# Patient Record
Sex: Female | Born: 1960 | Race: White | Hispanic: No | Marital: Single | Smoking: Current every day smoker
Health system: Southern US, Community
[De-identification: ages and names within clinical notes are randomized; demographics above are authoritative.]

## PROBLEM LIST (undated history)

## (undated) DIAGNOSIS — I10 Essential (primary) hypertension: Secondary | ICD-10-CM

## (undated) HISTORY — DX: Essential (primary) hypertension: I10

## (undated) HISTORY — PX: COLONOSCOPY: SHX174

---

## 2000-05-03 ENCOUNTER — Other Ambulatory Visit: Admission: RE | Admit: 2000-05-03 | Discharge: 2000-05-03 | Payer: Self-pay | Admitting: Gynecology

## 2001-02-09 ENCOUNTER — Emergency Department (HOSPITAL_COMMUNITY): Admission: EM | Admit: 2001-02-09 | Discharge: 2001-02-09 | Payer: Self-pay | Admitting: Emergency Medicine

## 2002-01-07 ENCOUNTER — Other Ambulatory Visit: Admission: RE | Admit: 2002-01-07 | Discharge: 2002-01-07 | Payer: Self-pay | Admitting: Gynecology

## 2003-06-17 ENCOUNTER — Encounter: Admission: RE | Admit: 2003-06-17 | Discharge: 2003-06-17 | Payer: Self-pay | Admitting: Gynecology

## 2004-08-01 ENCOUNTER — Emergency Department (HOSPITAL_COMMUNITY): Admission: EM | Admit: 2004-08-01 | Discharge: 2004-08-01 | Payer: Self-pay | Admitting: Emergency Medicine

## 2008-04-06 ENCOUNTER — Ambulatory Visit: Payer: Self-pay | Admitting: Gynecology

## 2008-04-06 ENCOUNTER — Encounter: Payer: Self-pay | Admitting: Gynecology

## 2008-04-06 ENCOUNTER — Other Ambulatory Visit: Admission: RE | Admit: 2008-04-06 | Discharge: 2008-04-06 | Payer: Self-pay | Admitting: Gynecology

## 2009-02-26 ENCOUNTER — Other Ambulatory Visit: Admission: RE | Admit: 2009-02-26 | Discharge: 2009-02-26 | Payer: Self-pay | Admitting: Gynecology

## 2009-02-26 ENCOUNTER — Encounter: Payer: Self-pay | Admitting: Gynecology

## 2009-02-26 ENCOUNTER — Ambulatory Visit: Payer: Self-pay | Admitting: Gynecology

## 2017-12-04 ENCOUNTER — Emergency Department (HOSPITAL_COMMUNITY): Payer: Self-pay

## 2017-12-04 ENCOUNTER — Encounter (HOSPITAL_COMMUNITY): Payer: Self-pay | Admitting: *Deleted

## 2017-12-04 ENCOUNTER — Inpatient Hospital Stay (HOSPITAL_COMMUNITY)
Admission: EM | Admit: 2017-12-04 | Discharge: 2017-12-12 | DRG: 896 | Disposition: A | Discharge status: Home / Self Care | Payer: Self-pay | Attending: Internal Medicine | Admitting: Internal Medicine

## 2017-12-04 ENCOUNTER — Other Ambulatory Visit: Payer: Self-pay

## 2017-12-04 DIAGNOSIS — E876 Hypokalemia: Secondary | ICD-10-CM | POA: Diagnosis present

## 2017-12-04 DIAGNOSIS — F1994 Other psychoactive substance use, unspecified with psychoactive substance-induced mood disorder: Secondary | ICD-10-CM | POA: Diagnosis present

## 2017-12-04 DIAGNOSIS — R45851 Suicidal ideations: Secondary | ICD-10-CM | POA: Diagnosis present

## 2017-12-04 DIAGNOSIS — R740 Nonspecific elevation of levels of transaminase and lactic acid dehydrogenase [LDH]: Secondary | ICD-10-CM | POA: Diagnosis present

## 2017-12-04 DIAGNOSIS — F17210 Nicotine dependence, cigarettes, uncomplicated: Secondary | ICD-10-CM | POA: Diagnosis present

## 2017-12-04 DIAGNOSIS — F10239 Alcohol dependence with withdrawal, unspecified: Secondary | ICD-10-CM

## 2017-12-04 DIAGNOSIS — F10932 Alcohol use, unspecified with withdrawal with perceptual disturbance: Secondary | ICD-10-CM

## 2017-12-04 DIAGNOSIS — E871 Hypo-osmolality and hyponatremia: Secondary | ICD-10-CM | POA: Diagnosis present

## 2017-12-04 DIAGNOSIS — F10232 Alcohol dependence with withdrawal with perceptual disturbance: Secondary | ICD-10-CM

## 2017-12-04 DIAGNOSIS — F329 Major depressive disorder, single episode, unspecified: Secondary | ICD-10-CM | POA: Diagnosis present

## 2017-12-04 DIAGNOSIS — G9341 Metabolic encephalopathy: Secondary | ICD-10-CM | POA: Diagnosis present

## 2017-12-04 DIAGNOSIS — F10931 Alcohol use, unspecified with withdrawal delirium: Secondary | ICD-10-CM

## 2017-12-04 DIAGNOSIS — R296 Repeated falls: Secondary | ICD-10-CM | POA: Diagnosis present

## 2017-12-04 DIAGNOSIS — D696 Thrombocytopenia, unspecified: Secondary | ICD-10-CM | POA: Diagnosis present

## 2017-12-04 DIAGNOSIS — E872 Acidosis: Secondary | ICD-10-CM | POA: Diagnosis present

## 2017-12-04 DIAGNOSIS — R569 Unspecified convulsions: Secondary | ICD-10-CM | POA: Diagnosis present

## 2017-12-04 DIAGNOSIS — E8779 Other fluid overload: Secondary | ICD-10-CM | POA: Diagnosis present

## 2017-12-04 DIAGNOSIS — F10231 Alcohol dependence with withdrawal delirium: Principal | ICD-10-CM | POA: Diagnosis present

## 2017-12-04 HISTORY — DX: Alcohol use, unspecified with withdrawal delirium: F10.931

## 2017-12-04 LAB — CBC
HCT: 37.8 % (ref 36.0–46.0)
Hemoglobin: 13.3 g/dL (ref 12.0–15.0)
MCH: 32 pg (ref 26.0–34.0)
MCHC: 35.2 g/dL (ref 30.0–36.0)
MCV: 91.1 fL (ref 78.0–100.0)
Platelets: 119 10*3/uL — ABNORMAL LOW (ref 150–400)
RBC: 4.15 MIL/uL (ref 3.87–5.11)
RDW: 13.2 % (ref 11.5–15.5)
WBC: 9.3 10*3/uL (ref 4.0–10.5)

## 2017-12-04 LAB — I-STAT CG4 LACTIC ACID, ED: Lactic Acid, Venous: 11.52 mmol/L (ref 0.5–1.9)

## 2017-12-04 LAB — I-STAT TROPONIN, ED: Troponin i, poc: 0.09 ng/mL (ref 0.00–0.08)

## 2017-12-04 LAB — COMPREHENSIVE METABOLIC PANEL
ALT: 83 U/L — ABNORMAL HIGH (ref 14–54)
AST: 129 U/L — ABNORMAL HIGH (ref 15–41)
Albumin: 4.2 g/dL (ref 3.5–5.0)
Alkaline Phosphatase: 66 U/L (ref 38–126)
Anion gap: 26 — ABNORMAL HIGH (ref 5–15)
BUN: 10 mg/dL (ref 6–20)
CO2: 16 mmol/L — ABNORMAL LOW (ref 22–32)
Calcium: 9.2 mg/dL (ref 8.9–10.3)
Chloride: 76 mmol/L — ABNORMAL LOW (ref 101–111)
Creatinine, Ser: 0.91 mg/dL (ref 0.44–1.00)
GFR calc Af Amer: >60 mL/min (ref >60)
GFR calc non Af Amer: >60 mL/min (ref >60)
Glucose, Bld: 168 mg/dL — ABNORMAL HIGH (ref 65–99)
Potassium: 3.1 mmol/L — ABNORMAL LOW (ref 3.5–5.1)
Sodium: 118 mmol/L — CL (ref 135–145)
Total Bilirubin: 1.9 mg/dL — ABNORMAL HIGH (ref 0.3–1.2)
Total Protein: 7.4 g/dL (ref 6.5–8.1)

## 2017-12-04 LAB — ACETAMINOPHEN LEVEL: Acetaminophen (Tylenol), Serum: <10 ug/mL — ABNORMAL LOW (ref 10–30)

## 2017-12-04 LAB — I-STAT CHEM 8, ED
BUN: 9 mg/dL (ref 6–20)
Calcium, Ion: 0.96 mmol/L — ABNORMAL LOW (ref 1.15–1.40)
Chloride: 76 mmol/L — ABNORMAL LOW (ref 101–111)
Creatinine, Ser: 0.7 mg/dL (ref 0.44–1.00)
Glucose, Bld: 161 mg/dL — ABNORMAL HIGH (ref 65–99)
HCT: 46 % (ref 36.0–46.0)
Hemoglobin: 15.6 g/dL — ABNORMAL HIGH (ref 12.0–15.0)
Potassium: 2.9 mmol/L — ABNORMAL LOW (ref 3.5–5.1)
Sodium: 116 mmol/L — CL (ref 135–145)
TCO2: 15 mmol/L — ABNORMAL LOW (ref 22–32)

## 2017-12-04 LAB — DIFFERENTIAL
Abs Immature Granulocytes: 0.1 10*3/uL (ref 0.0–0.1)
Basophils Absolute: 0 10*3/uL (ref 0.0–0.1)
Basophils Relative: 0 %
Eosinophils Absolute: 0 10*3/uL (ref 0.0–0.7)
Eosinophils Relative: 0 %
Immature Granulocytes: 1 %
Lymphocytes Relative: 23 %
Lymphs Abs: 2.1 10*3/uL (ref 0.7–4.0)
Monocytes Absolute: 0.7 10*3/uL (ref 0.1–1.0)
Monocytes Relative: 7 %
Neutro Abs: 6.4 10*3/uL (ref 1.7–7.7)
Neutrophils Relative %: 69 %

## 2017-12-04 LAB — APTT: aPTT: 31 seconds (ref 24–36)

## 2017-12-04 LAB — SALICYLATE LEVEL: Salicylate Lvl: <7 mg/dL (ref 2.8–30.0)

## 2017-12-04 LAB — I-STAT BETA HCG BLOOD, ED (MC, WL, AP ONLY): I-stat hCG, quantitative: 5.2 m[IU]/mL — ABNORMAL HIGH (ref <5)

## 2017-12-04 LAB — PROTIME-INR
INR: 1
Prothrombin Time: 13.1 seconds (ref 11.4–15.2)

## 2017-12-04 LAB — AMMONIA: Ammonia: 80 umol/L — ABNORMAL HIGH (ref 9–35)

## 2017-12-04 LAB — LIPASE, BLOOD: Lipase: 36 U/L (ref 11–51)

## 2017-12-04 LAB — ETHANOL: ALCOHOL ETHYL (B): 26 mg/dL — AB (ref <10)

## 2017-12-04 MED ORDER — LORAZEPAM 1 MG PO TABS: 0.0000 mg | ORAL_TABLET | Freq: Four times a day (QID) | ORAL | Status: DC

## 2017-12-04 MED ORDER — SODIUM CHLORIDE 0.9 % IV SOLN
INTRAVENOUS | Status: DC
  Administered 2017-12-04 – 2017-12-05 (×3): via INTRAVENOUS

## 2017-12-04 MED ORDER — SODIUM CHLORIDE 0.9 % IV BOLUS
1000.0000 mL | Freq: Once | INTRAVENOUS | Status: AC
  Administered 2017-12-04: 1000 mL via INTRAVENOUS

## 2017-12-04 MED ORDER — INSULIN ASPART 100 UNIT/ML ~~LOC~~ SOLN
1.0000 [IU] | SUBCUTANEOUS | Status: DC
  Administered 2017-12-05 – 2017-12-07 (×6): 1 [IU] via SUBCUTANEOUS
  Administered 2017-12-07: 2 [IU] via SUBCUTANEOUS
  Administered 2017-12-08 (×2): 1 [IU] via SUBCUTANEOUS

## 2017-12-04 MED ORDER — SODIUM CHLORIDE 0.9 % IV SOLN: 250.0000 mL | INTRAVENOUS | Status: DC | PRN

## 2017-12-04 MED ORDER — POTASSIUM CHLORIDE 10 MEQ/100ML IV SOLN
10.0000 meq | INTRAVENOUS | Status: AC
Start: 2017-12-04 — End: 2017-12-05
  Administered 2017-12-04 (×2): 10 meq via INTRAVENOUS
  Filled 2017-12-04: qty 100

## 2017-12-04 MED ORDER — LORAZEPAM 2 MG/ML IJ SOLN
2.0000 mg | Freq: Once | INTRAMUSCULAR | Status: AC
  Administered 2017-12-04: 2 mg via INTRAVENOUS

## 2017-12-04 MED ORDER — FOLIC ACID 5 MG/ML IJ SOLN
1.0000 mg | Freq: Every day | INTRAMUSCULAR | Status: DC
  Administered 2017-12-05 – 2017-12-06 (×2): 1 mg via INTRAVENOUS
  Filled 2017-12-04 (×6): qty 0.2

## 2017-12-04 MED ORDER — LORAZEPAM 2 MG/ML IJ SOLN
INTRAMUSCULAR | Status: AC
  Filled 2017-12-04: qty 1

## 2017-12-04 MED ORDER — HEPARIN SODIUM (PORCINE) 5000 UNIT/ML IJ SOLN
5000.0000 [IU] | Freq: Three times a day (TID) | INTRAMUSCULAR | Status: DC
  Administered 2017-12-05 – 2017-12-06 (×5): 5000 [IU] via SUBCUTANEOUS
  Filled 2017-12-04 (×5): qty 1

## 2017-12-04 MED ORDER — LORAZEPAM 2 MG/ML IJ SOLN: 0.0000 mg | Freq: Two times a day (BID) | INTRAMUSCULAR | Status: DC

## 2017-12-04 MED ORDER — POTASSIUM CHLORIDE 10 MEQ/50ML IV SOLN: 10.0000 meq | INTRAVENOUS | Status: DC

## 2017-12-04 MED ORDER — LORAZEPAM 2 MG/ML IJ SOLN
0.0000 mg | Freq: Four times a day (QID) | INTRAMUSCULAR | Status: DC
  Administered 2017-12-04: 2 mg via INTRAVENOUS

## 2017-12-04 MED ORDER — DEXMEDETOMIDINE HCL IN NACL 200 MCG/50ML IV SOLN
0.4000 ug/kg/h | INTRAVENOUS | Status: DC
Start: 2017-12-04 — End: 2017-12-05
  Administered 2017-12-04: 0.4 ug/kg/h via INTRAVENOUS
  Administered 2017-12-05: 0.8 ug/kg/h via INTRAVENOUS
  Filled 2017-12-04: qty 50

## 2017-12-04 MED ORDER — VITAMIN B-1 100 MG PO TABS
100.0000 mg | ORAL_TABLET | Freq: Every day | ORAL | Status: DC
  Administered 2017-12-07 – 2017-12-12 (×6): 100 mg via ORAL
  Filled 2017-12-04 (×8): qty 1

## 2017-12-04 MED ORDER — LORAZEPAM 1 MG PO TABS: 0.0000 mg | ORAL_TABLET | Freq: Two times a day (BID) | ORAL | Status: DC

## 2017-12-04 MED ORDER — THIAMINE HCL 100 MG/ML IJ SOLN
100.0000 mg | Freq: Every day | INTRAMUSCULAR | Status: DC
  Administered 2017-12-04 – 2017-12-06 (×3): 100 mg via INTRAVENOUS
  Filled 2017-12-04 (×3): qty 2

## 2017-12-04 MED ORDER — LORAZEPAM 2 MG/ML IJ SOLN
INTRAMUSCULAR | Status: AC
  Filled 2017-12-04: qty 2

## 2017-12-04 MED ORDER — MAGNESIUM SULFATE 2 GM/50ML IV SOLN
2.0000 g | Freq: Once | INTRAVENOUS | Status: AC
  Administered 2017-12-04: 2 g via INTRAVENOUS
  Filled 2017-12-04: qty 50

## 2017-12-04 MED ORDER — POTASSIUM CHLORIDE 10 MEQ/100ML IV SOLN
10.0000 meq | INTRAVENOUS | Status: AC
  Administered 2017-12-05 (×2): 10 meq via INTRAVENOUS
  Filled 2017-12-04 (×2): qty 100

## 2017-12-04 MED ORDER — ONDANSETRON HCL 4 MG/2ML IJ SOLN: 4.0000 mg | Freq: Four times a day (QID) | INTRAMUSCULAR | Status: DC | PRN

## 2017-12-04 MED ORDER — FAMOTIDINE IN NACL 20-0.9 MG/50ML-% IV SOLN
20.0000 mg | Freq: Two times a day (BID) | INTRAVENOUS | Status: DC
  Administered 2017-12-05 – 2017-12-06 (×3): 20 mg via INTRAVENOUS
  Filled 2017-12-04 (×4): qty 50

## 2017-12-04 MED ORDER — MIDAZOLAM HCL 2 MG/2ML IJ SOLN
1.0000 mg | INTRAMUSCULAR | Status: DC | PRN
  Administered 2017-12-05 – 2017-12-06 (×3): 2 mg via INTRAVENOUS
  Filled 2017-12-04 (×4): qty 2

## 2017-12-04 NOTE — ED Provider Notes (Signed)
[facility] EMERGENCY DEPARTMENT Provider Note   CSN: [id number] Arrival date & time: 12/04/17  2119     History   Chief Complaint No chief complaint on file.   HPI Jessica [name] is a 57 y.o. female.  Patient is a 57 year old female coming in via code stroke by EMS.  Report was patient has a history of alcohol abuse and did cut down on her drinking in the last 1-2 days.  Around 8 PM patient had a witnessed change in behavior with generalized shaking and what seemed to be a seizure with persistent confusion and tremor in the upper extremity.  On the way here patient continued to become more lucid.  Here patient is complaining of feeling shaky.  She states she has had several falls over the last few days.  She denies any numbness, weakness or headache at this time.  No nausea or vomiting.  Friend that she lived with called EMS.  She states she typically drinks a bottle of wine daily or 4-5 beers.  She denies any drug use.  She has been drinking since she was 57 years old and states last time she tried to quit was in the 90s.  She has never experienced alcohol withdrawal before.  The history is provided by the patient and the EMS personnel.    No past medical history on file.  There are no active problems to display for this patient.   History reviewed. No pertinent surgical history.   OB History   None      Home Medications    Prior to Admission medications   Not on File    Family History No family history on file.  Social History Social History   Tobacco Use  . Smoking status: Not on file  Substance Use Topics  . Alcohol use: Not on file  . Drug use: Not on file     Allergies   Patient has no allergy information on record.   Review of Systems Review of Systems  All other systems reviewed and are negative.    Physical Exam Updated Vital Signs There were no vitals taken for this visit.  Physical Exam  Constitutional: She is  oriented to person, place, and time. She appears well-developed and well-nourished. No distress.  HENT:  Head: Normocephalic. Head is with contusion.    Mouth/Throat: Oropharynx is clear and moist.  Eyes: Pupils are equal, round, and reactive to light. Conjunctivae and EOM are normal.  Neck: Normal range of motion. Neck supple.  Cardiovascular: Regular rhythm and intact distal pulses. Tachycardia present.  No murmur heard. Pulmonary/Chest: Effort normal and breath sounds normal. No respiratory distress. She has no wheezes. She has no rales.  Abdominal: Soft. She exhibits no distension. There is no tenderness. There is no rebound and no guarding.  Musculoskeletal: Normal range of motion. She exhibits no edema or tenderness.  Neurological: She is alert and oriented to person, place, and time.  5 out of 5 strength in all 4 extremities.  Sensation is intact.  Speech is clear without evidence of aphasia.  Patient has tremor and asterixis in all 4 extremities.  No pronator drift present.  Patient is awake alert and can follow commands appropriately.  Skin: Skin is warm and dry. No rash noted. No erythema.  Psychiatric: She has a normal mood and affect. Her behavior is normal.  Nursing note and vitals reviewed.    ED Treatments / Results  Labs (all labs ordered are listed,  but only abnormal results are displayed) Labs Reviewed  I-STAT TROPONIN, ED - Abnormal; Notable for the following components:      Result Value   Troponin i, poc 0.09 (*)    All other components within normal limits  I-STAT CHEM 8, ED - Abnormal; Notable for the following components:   Sodium 116 (*)    Potassium 2.9 (*)    Chloride 76 (*)    Glucose, Bld 161 (*)    Calcium, Ion 0.96 (*)    TCO2 15 (*)    Hemoglobin 15.6 (*)    All other components within normal limits  I-STAT BETA HCG BLOOD, ED ([facility], [facility], [facility] ONLY) - Abnormal; Notable for the following components:   I-stat hCG, quantitative 5.2 (*)    All other  components within normal limits  I-STAT CG4 LACTIC ACID, ED - Abnormal; Notable for the following components:   Lactic Acid, Venous 11.52 (*)    All other components within normal limits  PROTIME-INR  APTT  CBC  DIFFERENTIAL  COMPREHENSIVE METABOLIC PANEL  ETHANOL  AMMONIA  LIPASE, BLOOD  RAPID URINE DRUG SCREEN, HOSP PERFORMED  ACETAMINOPHEN LEVEL  SALICYLATE LEVEL  URINALYSIS, ROUTINE W REFLEX MICROSCOPIC  MAGNESIUM  CBG MONITORING, ED  I-STAT TROPONIN, ED    EKG None  Radiology Dg Chest Port 1 View  Result Date: 12/04/2017 CLINICAL DATA:  Delirium tremens. Tachycardia, acute onset. EXAM: PORTABLE CHEST 1 VIEW COMPARISON:  None. FINDINGS: The lungs are hyperexpanded but appear grossly clear. There is no evidence of focal opacification, pleural effusion or pneumothorax. The cardiomediastinal silhouette is within normal limits. No acute osseous abnormalities are seen. IMPRESSION: Lungs hyperexpanded but grossly clear. Electronically Signed   By: Garald Balding M.D.   On: 12/04/2017 21:53   Ct Head Code Stroke Wo Contrast  Result Date: 12/04/2017 CLINICAL DATA:  Code stroke. Generalized shaking. Last seen normal at 20 100 hours. EXAM: CT HEAD WITHOUT CONTRAST TECHNIQUE: Contiguous axial images were obtained from the base of the skull through the vertex without intravenous contrast. COMPARISON:  None. FINDINGS: Moderate motion degraded examination. BRAIN: No intraparenchymal hemorrhage, mass effect nor midline shift. No parenchymal brain volume loss for age. Cavum septum pellucidum. No hydrocephalus. No acute large vascular territory infarcts. No abnormal extra-axial fluid collections. Basal cisterns are patent. VASCULAR: Unremarkable. SKULL/SOFT TISSUES: No skull fracture. Scattered subcentimeter calvarial lucencies. No significant soft tissue swelling. ORBITS/SINUSES: The included ocular globes and orbital contents are normal.The mastoid aircells and included paranasal sinuses are  well-aerated. OTHER: None. ASPECTS [facility] Stroke Program Early CT Score) - Ganglionic level infarction (caudate, lentiform nuclei, internal capsule, insula, M1-M3 cortex): 7 - Supraganglionic infarction (M4-M6 cortex): 3 Total score (0-10 with 10 being normal): 10 IMPRESSION: 1. Moderate motion degraded examination without acute intracranial process. 2. ASPECTS is 10. 3. Scattered subcentimeter calvarial lucencies could reflect hemangiomas though if there is a history of cancer, metastatic disease is possible. 4. Critical Value/emergent results text paged to [place], Neurology via AMION secure system on 12/04/2017 at 9:35 pm, including interpreting physician's phone number. Electronically Signed   By: Elon Alas M.D.   On: 12/04/2017 21:36   Ct Maxillofacial Wo Contrast  Result Date: 12/04/2017 CLINICAL DATA:  Blunt maxillofacial trauma EXAM: CT MAXILLOFACIAL WITHOUT CONTRAST TECHNIQUE: Multidetector CT imaging of the maxillofacial structures was performed. Multiplanar CT image reconstructions were also generated. COMPARISON:  Partial comparison to CT head dated 12/04/2017 at 2126 hours FINDINGS: Moderate to severe motion degradation, severely limiting evaluation. Study is almost nondiagnostic,  particularly involving the mandible. Osseous: No gross maxillofacial fracture, noting severe motion degradation. Bilateral mandibular condyles appear well-seated in the TMJs. Left frontal calvarial lucency (image 99), incompletely visualized, better evaluated on dedicated CT. Orbits: Bilateral orbits, including the globes and retroconal soft tissues, are grossly unremarkable. Sinuses: Visualized paranasal sinuses and mastoid air cells are grossly clear. Soft tissues: Negative. Limited intracranial: Grossly unremarkable, better evaluated on dedicated head CT. IMPRESSION: Evaluation is severely limited due to motion degradation. Nondiagnostic evaluation of the mandible. Within that constraint, no gross  maxillofacial fracture is seen. Electronically Signed   By: Julian Hy M.D.   On: 12/04/2017 21:46    Procedures Procedures (including critical care time)  Medications Ordered in ED Medications  sodium chloride 0.9 % bolus 1,000 mL (has no administration in time range)  LORazepam (ATIVAN) injection 0-4 mg (has no administration in time range)    Or  LORazepam (ATIVAN) tablet 0-4 mg (has no administration in time range)  LORazepam (ATIVAN) injection 0-4 mg (has no administration in time range)    Or  LORazepam (ATIVAN) tablet 0-4 mg (has no administration in time range)  thiamine (VITAMIN B-1) tablet 100 mg (has no administration in time range)    Or  thiamine (B-1) injection 100 mg (has no administration in time range)     Initial Impression / Assessment and Plan / ED Course  I have reviewed the triage vital signs and the nursing notes.  Pertinent labs & imaging results that were available during my care of the patient were reviewed by me and considered in my medical decision making (see chart for details).     Patient initially presenting by EMS as a possible code stroke.  Patient has what sounds like seizure-like activity today with ongoing deficits post seizure.  Patient has a history of heavy alcohol abuse and has recently cut back drinking today.  Patient is awake and alert here with signs of delirium tremens and a florid alcohol withdrawal.  She has asterixis and diffuse tremor.  She does have some signs of recent trauma to her face.  Patient is tachycardic and hypertensive.  We will start her on CIWA protocol.  Head CT to ensure no acute bleed but low suspicion for stroke.  Neurology team is at bedside and canceling the code stroke.  CT of the face was also performed.  CBC, CMP, troponin, lipase, UA, EtOH, UDS, salicylate, acetaminophen, ammonia levels are pending.  Patient given IV fluids.  10:00 PM Patient noted to have a mild troponin bump of 0.09, lactic acidosis of  11 which is probably from the recent seizure, hyponatremia of 116 and hypokalemia of 2.9.  Patient's renal function is preserved.  Patient's head CT is negative for stroke or acute bleed.  CT of the face is negative for acute fracture.  X-ray of the chest is clear.  After 2 mg of Ativan patient has not had any change in symptoms.  She was given another 2 of Ativan.  IV fluids, potassium magnesium replacement initiated.  Also patient given thiamine.  10:15 PM Is now had 6 mg of Ativan without significant change in her status.  Feel most likely she will require Precedex.  Will discuss with ICU.  CRITICAL CARE Performed by: Seraphim Affinito Total critical care time: 35 minutes Critical care time was exclusive of separately billable procedures and treating other patients. Critical care was necessary to treat or prevent imminent or life-threatening deterioration. Critical care was time spent personally by me on the  following activities: development of treatment plan with patient and/or surrogate as well as nursing, discussions with consultants, evaluation of patient's response to treatment, examination of patient, obtaining history from patient or surrogate, ordering and performing treatments and interventions, ordering and review of laboratory studies, ordering and review of radiographic studies, pulse oximetry and re-evaluation of patient's condition.   Final Clinical Impressions(s) / ED Diagnoses   Final diagnoses:  Delirium tremens ([place])  Alcohol withdrawal syndrome with perceptual disturbance ([facility])  Hypokalemia  Hyponatremia    ED Discharge Orders    None       Blanchie Dessert, MD 12/04/17 2216

## 2017-12-04 NOTE — ED Triage Notes (Signed)
Pt arrived by EMS for Code Stroke, LNW 2000. Pt had a witness seizure at home with significant other. No hx of seizures. Reports frequent falls, bruising to R eye and bilateral arms. Pt reports alcohol abuse, amount varies when asked, stating "a bottle of wine tonight" or "3-4 glasses of wine a night." Per EMS, pt has been drinking less alcohol than usual. Tremors noted throughout, no neuro deficits noted. Pt also reports "lots of allergies" but unable to recall medications. Denies allergies to ativan

## 2017-12-04 NOTE — ED Notes (Signed)
CCM at bedside 

## 2017-12-04 NOTE — ED Notes (Signed)
Code stroke cancelled @ 2130

## 2017-12-04 NOTE — Consult Note (Addendum)
Neurology Consultation  Reason for Consult: Acute code stroke Referring Physician: Dr. Maryan Rued  CC: Shaking, seizure  History is obtained from: EMS, patient  HPI: Jessica [name] is a 57 y.o. female history of heavy alcohol abuse who has not been drinking as much over the last 1 to 2 days and has been having generalized shakiness and multiple falls over the past 2 days, was brought into the emergency room for emergent evaluation after above and witnessed her having a generalized tonic-clonic seizure this evening. Last seen normal 8 PM.  She had a witnessed generalized tonic-clonic seizure, unclear of the duration.  When she came about, she continued to have generalized shaking.  She was able to provide history through those episodes of shaking after the major generalized tonic-clonic seizure. On my initial examination, I did not appreciate any focal motor, sensory or cranial nerve deficits. She did exhibit generalized tremulousness with her heart rate in the 150s and elevated blood pressure, likely delirium tremens. Denies any lateralized weakness tingling numbness. Denies any headaches or visual symptoms. Prior seizures   LKW: 8 PM tpa given?: no, not a stroke, likely delirium tremens Premorbid modified Rankin scale (mRS): 0  ROS: ROS was performed and is negative except as noted in the HPI.  No past medical history on file.   No family history on file.   Social History:   has no tobacco, alcohol, and drug history on file. Drinks at least 2 bottles of wine and sixpack of beer every night  Medications  Current Facility-Administered Medications:  .  sodium chloride 0.9 % bolus 1,000 mL, 1,000 mL, Intravenous, Once, Blanchie Dessert, MD No current outpatient medications on file.  Exam: Current vital signs: There were no vitals taken for this visit. Vital signs in last 24 hours:  Heart rate 128 Systolic blood pressure 786 General: Patient is awake, alert, generally  tremulous, no acute distress HEENT: Normocephalic, bruise over the right eye, tongue bites. CVS: Tachycardic, no murmur Respiratory: Clear to auscultation Abdomen: Nondistended nontender Neurological examination Patient awake, alert, oriented x3 Her speech is mildly dysarthric due to the generalized shakiness. Naming, comprehension and repetition are intact. Cranial nerves: Pupils equal round react light, extra ocular movements intact, visual fields full, face symmetric, facial sensation intact, palate elevates symmetrically, tongue midline. Motor exam: Symmetric 5/5 strength with generalized tremulousness and asterixis on outstretched arms with no vertical drift. Sensory exam: Intact light touch Coordination: Generalized tremulousness, generally mildly ataxic all over likely secondary to the generalized tremulousness. Gait testing was deferred at this time. NIHSS 1 for mild dysarthria   Labs I have reviewed labs in epic and the results pertinent to this consultation are:  CBC    Component Value Date/Time   WBC 9.3 12/04/2017 2133   RBC 4.15 12/04/2017 2133   HGB 13.3 12/04/2017 2133   HCT 37.8 12/04/2017 2133   PLT PENDING 12/04/2017 2133   MCV 91.1 12/04/2017 2133   MCH 32.0 12/04/2017 2133   MCHC 35.2 12/04/2017 2133   RDW 13.2 12/04/2017 2133   LYMPHSABS 2.1 12/04/2017 2133   MONOABS 0.7 12/04/2017 2133   EOSABS 0.0 12/04/2017 2133   BASOSABS 0.0 12/04/2017 2133    CMP     Component Value Date/Time   NA 116 (LL) 12/04/2017 2125   K 2.9 (L) 12/04/2017 2125   CL 76 (L) 12/04/2017 2125   GLUCOSE 161 (H) 12/04/2017 2125   BUN 9 12/04/2017 2125   CREATININE 0.70 12/04/2017 2125   Imaging I have  reviewed the images obtained:  CT-scan of the brain-no acute changes  Assessment:  57 year old with history of heavy alcohol abuse, who has been cutting down on her drinking over the past 2 days, brought in for witnessed generalized tonic clonic seizure and generalized  homelessness. On examination, she is nonfocal but has generalized tremors. She appears to be in delirium tremens-based on the clinical exam as well as her vitals. Also, her labs reveal a sodium of 116- it might have a component of chronic hyponatremia because of heavy alcohol abuse.  The seizure might have been provoked by hyponatremia as well. At this time, I think she needs treatment for her DTs and hyponatremia.  Impression: Alcohol withdrawal-DTs Unlikely to be a stroke Provoked seizure x1-secondary to hyponatremia and alcohol withdrawal  Recommendations: -Close monitoring and treatment of alcohol withdrawal and DTs per primary team -Correction of hyponatremia- ensure no more than 10 points over 24 hours corrected so that patient does not run the risk of developing central pontine myelinolysis -Seizure precautions -I would not initiate antiepileptics at this time as this was clearly a provoked seizure and treatment of alcohol withdrawal will ensure that she does not have seizures. -She should get a brain MRI at some point as a part of work-up. -Her examination is not consistent with status.  No need for an emergent EEG at this time.  -An outpatient EEG should be performed to complete the work-up. -High-dose thiamine IV  Neurology will be available as needed. Please call us with questions  -- Amie Portland, MD [facility] Pager: [phone] If 7pm to 7am, please call on call as listed on AMION.  CRITICAL CARE ATTESTATION This patient is critically ill and at significant risk of neurological worsening, death and care requires constant monitoring of vital signs, hemodynamics,respiratory and cardiac monitoring. I spent 45  minutes of neurocritical care time performing neurological assessment, discussion with family, other specialists and medical decision making of high complexityin the care of  this patient.

## 2017-12-04 NOTE — ED Notes (Signed)
Significant Tremors still present, speech difficult to understand at times. Pt able to follow commands.

## 2017-12-04 NOTE — Progress Notes (Signed)
  Responded to page and [place] for [facility] for this patient.  Explained the Advanced Directive to the boyfriend and also explained that it is the patient's signature that is needed.  Boyfriend thought his signature was needed but I assured him that he doesn't sign the document.  Also explained that witnesses are needed and that these documents are notarized during daytime hours when there are volunteers here that can be witnesses, to witness the patient's signature.  Provided a document for the boyfriend.    12/04/17 2337  Clinical Encounter Type  Visited With Patient;Family (Live in boyfriend)  Visit Type Initial;Spiritual support

## 2017-12-04 NOTE — H&P (Signed)
PULMONARY / CRITICAL CARE MEDICINE   Name: Jessica [name] MRN: [id number] DOB: 04/07/1961    ADMISSION DATE:  12/04/2017 CONSULTATION DATE:  5/28  REFERRING MD:  Dr. Maryan Rued EDP.  CHIEF COMPLAINT:  Seizure  HISTORY OF PRESENT ILLNESS:  Patient is encephalopathic and/or intubated. Therefore history has been obtained from chart review. 57 year old female with no significant past medical history.  HPI obtained from her live-in boyfriend of 10 years.  He reports that at baseline she is not very productive on daily basis.  She does not work and has not for some time.  She is not on disability.  She drinks a large bottle of wine per day, however, she reports only drinking 2 small glasses.  They both agree that she drinks gallons of water per day.  She has been experiencing increasing depression over the past 3 or 4 months.  She has even been suicidal at time of the few weeks prior to presentation.  Boyfriend claims her drinking has picked up over this time.  She has blacked out several times and has suffered many falls.  He feels as though she has suffered at least one concussion.  Starting 526 she had developed some nausea and had not been eating or drinking as much.  He reported coming home and she was not sure which was abnormal.  In the evening hours of 5/28 she had a witnessed tonic-clonic type seizure.  EMS was called and she presented to the emergency department.  Upon arrival to the ED she continued with significant tremors. Code stroke was called but was cancelled once neurology saw the patient. They felt this represented alcohol withdrawal which probably caused the seizure. Notably her sodium was 118 as well. PCCM asked to admit due to profound hyponatremia and need for close monitoring.    PAST MEDICAL HISTORY :  She  has no past medical history on file.  PAST SURGICAL HISTORY: She  has no past surgical history on file.  Allergies not on file  No current facility-administered  medications on file prior to encounter.    Current Outpatient Medications on File Prior to Encounter  Medication Sig  . zolpidem (AMBIEN) 5 MG tablet Take 5 mg by mouth at bedtime.    FAMILY HISTORY:  Her has no family status information on file.    SOCIAL HISTORY: She  reports that she has been smoking.  She has been smoking about 1.00 pack per day. She has never used smokeless tobacco. She reports that she drinks alcohol. She reports that she does not use drugs.  REVIEW OF SYSTEMS:   Bolds are positive  Constitutional: weight loss, gain, night sweats, Fevers, chills, fatigue .  HEENT: headaches, Sore throat, sneezing, nasal congestion, post nasal drip, Difficulty swallowing, Tooth/dental problems, visual complaints visual changes, ear ache CV:  chest pain, radiates:,Orthopnea, PND, swelling in lower extremities, dizziness, palpitations, syncope.  GI  heartburn, indigestion, abdominal pain, nausea, vomiting, diarrhea, change in bowel habits, loss of appetite, bloody stools.  Resp: cough, productive:, hemoptysis, dyspnea, chest pain, pleuritic.  Skin: rash or itching or icterus GU: dysuria, change in color of urine, urgency or frequency. flank pain, hematuria  MS: joint pain or swelling. decreased range of motion  Psych: change in mood or affect. depression or anxiety.  Neuro: difficulty with speech, weakness, numbness, ataxia   SUBJECTIVE:    VITAL SIGNS: BP (!) 162/90   Pulse (!) 153   Temp (!) 97.1 F (36.2 C) (Temporal)   Resp (!) 26  Ht 5\' 11"  (1.803 m)   Wt 65.3 kg (144 lb)   SpO2 99%   BMI 20.08 kg/m   HEMODYNAMICS:    VENTILATOR SETTINGS:    INTAKE / OUTPUT: No intake/output data recorded.  PHYSICAL EXAMINATION: General:  Thin middle aged female in NAD Neuro:  Alert, oriented, non-focal. Tremulous.  HEENT:  [place]/AT, no JVD, pupils 85mm and sluggish. Edema and ecchymosis to R periorbit. Cardiovascular:  Tachy, appears regular. No MRG Lungs:  Clear  bilateral breath sounds. Unlabored.  Abdomen:  Soft, non-tender, non-distended Musculoskeletal:  No acute deformity or ROM limitation  Skin:  Grossly intact.   LABS:  BMET Recent Labs  Lab 12/04/17 2125 12/04/17 2133  NA 116* 118*  K 2.9* 3.1*  CL 76* 76*  CO2  --  16*  BUN 9 10  CREATININE 0.70 0.91  GLUCOSE 161* 168*    Electrolytes Recent Labs  Lab 12/04/17 2133  CALCIUM 9.2    CBC Recent Labs  Lab 12/04/17 2125 12/04/17 2133  WBC  --  9.3  HGB 15.6* 13.3  HCT 46.0 37.8  PLT  --  119*    Coag's Recent Labs  Lab 12/04/17 2133  APTT 31  INR 1.00    Sepsis Markers Recent Labs  Lab 12/04/17 2126  LATICACIDVEN 11.52*    ABG No results for input(s): PHART, PCO2ART, PO2ART in the last 168 hours.  Liver Enzymes Recent Labs  Lab 12/04/17 2133  AST 129*  ALT 83*  ALKPHOS 66  BILITOT 1.9*  ALBUMIN 4.2    Cardiac Enzymes No results for input(s): TROPONINI, PROBNP in the last 168 hours.  Glucose No results for input(s): GLUCAP in the last 168 hours.  Imaging Dg Chest Port 1 View  Result Date: 12/04/2017 CLINICAL DATA:  Delirium tremens. Tachycardia, acute onset. EXAM: PORTABLE CHEST 1 VIEW COMPARISON:  None. FINDINGS: The lungs are hyperexpanded but appear grossly clear. There is no evidence of focal opacification, pleural effusion or pneumothorax. The cardiomediastinal silhouette is within normal limits. No acute osseous abnormalities are seen. IMPRESSION: Lungs hyperexpanded but grossly clear. Electronically Signed   By: Garald Balding M.D.   On: 12/04/2017 21:53   Ct Head Code Stroke Wo Contrast  Result Date: 12/04/2017 CLINICAL DATA:  Code stroke. Generalized shaking. Last seen normal at 20 100 hours. EXAM: CT HEAD WITHOUT CONTRAST TECHNIQUE: Contiguous axial images were obtained from the base of the skull through the vertex without intravenous contrast. COMPARISON:  None. FINDINGS: Moderate motion degraded examination. BRAIN: No  intraparenchymal hemorrhage, mass effect nor midline shift. No parenchymal brain volume loss for age. Cavum septum pellucidum. No hydrocephalus. No acute large vascular territory infarcts. No abnormal extra-axial fluid collections. Basal cisterns are patent. VASCULAR: Unremarkable. SKULL/SOFT TISSUES: No skull fracture. Scattered subcentimeter calvarial lucencies. No significant soft tissue swelling. ORBITS/SINUSES: The included ocular globes and orbital contents are normal.The mastoid aircells and included paranasal sinuses are well-aerated. OTHER: None. ASPECTS [facility] Stroke Program Early CT Score) - Ganglionic level infarction (caudate, lentiform nuclei, internal capsule, insula, M1-M3 cortex): 7 - Supraganglionic infarction (M4-M6 cortex): 3 Total score (0-10 with 10 being normal): 10 IMPRESSION: 1. Moderate motion degraded examination without acute intracranial process. 2. ASPECTS is 10. 3. Scattered subcentimeter calvarial lucencies could reflect hemangiomas though if there is a history of cancer, metastatic disease is possible. 4. Critical Value/emergent results text paged to [place], Neurology via AMION secure system on 12/04/2017 at 9:35 pm, including interpreting physician's phone number. Electronically Signed   By: Sandie Ano  Bloomer M.D.   On: 12/04/2017 21:36   Ct Maxillofacial Wo Contrast  Result Date: 12/04/2017 CLINICAL DATA:  Blunt maxillofacial trauma EXAM: CT MAXILLOFACIAL WITHOUT CONTRAST TECHNIQUE: Multidetector CT imaging of the maxillofacial structures was performed. Multiplanar CT image reconstructions were also generated. COMPARISON:  Partial comparison to CT head dated 12/04/2017 at 2126 hours FINDINGS: Moderate to severe motion degradation, severely limiting evaluation. Study is almost nondiagnostic, particularly involving the mandible. Osseous: No gross maxillofacial fracture, noting severe motion degradation. Bilateral mandibular condyles appear well-seated in the TMJs. Left frontal  calvarial lucency (image 99), incompletely visualized, better evaluated on dedicated CT. Orbits: Bilateral orbits, including the globes and retroconal soft tissues, are grossly unremarkable. Sinuses: Visualized paranasal sinuses and mastoid air cells are grossly clear. Soft tissues: Negative. Limited intracranial: Grossly unremarkable, better evaluated on dedicated head CT. IMPRESSION: Evaluation is severely limited due to motion degradation. Nondiagnostic evaluation of the mandible. Within that constraint, no gross maxillofacial fracture is seen. Electronically Signed   By: Julian Hy M.D.   On: 12/04/2017 21:46    STUDIES:  CT head/maxillofacial 5/28 > Evaluation is severely limited due to motion degradation. Nondiagnostic evaluation of the mandible. Within that constraint, no gross maxillofacial fracture is seen. Moderate motion degraded examination without acute intracranial process. Scattered subcentimeter calvarial lucencies could reflect hemangiomas though if there is a history of cancer, metastatic disease is possible.  CULTURES:   ANTIBIOTICS:   SIGNIFICANT EVENTS: 5/28 > seizure. Admit to ICU for hyponatremia and ETOH withdrawal.   LINES/TUBES:   DISCUSSION: 57 year old female who is an every day drinker admitted after witnessed seizure at home. Has cut back on drinking last few days due to nausea. Sodium 118 on admission. Admitted to ICU for precedex infusion and close soidum monitoring. Being replaced with NS.  ASSESSMENT / PLAN:  CARDIOVASCULAR A:  Hypertension: Cuff pressures inaccurate due to tremors. Manual SBP 162mmHg  P:  Monitor. Manual BPs for accuracy.   RENAL A:   Hyponatremia:  Beer potomania (can this occur with wine? She does sometimes drink beer) vs polydipsia ("gallons" of water per day). Seems to be hypovolemic so reasonable to initiate IVF with normal saline.  Hypokalemia AG acidosis (lactic in setting of seizure. LA 11 on admit)  P:   BMP now  and q 4 hours to closely monitor electrolytes Free water restrict. Will make NPO for now with aspiration risk.   GASTROINTESTINAL  A:   Transaminitis  P:  Trend LFT Consider liver imaging if unresolved, or once acute phase passed.  NPO  HEMATOLOGIC A:   Thrombocytopenia r/t alcoholism  P:  Follow CBC Heparin SQ for VTE ppx.  INFECTIOUS A:   No acute issues  P:   Follow WBC and fever curve  NEUROLOGIC A:   Seizure likely secondary to hyponatremia, alcohol withdrawal.  ETOH withdrawal with delirium tremens.   P:   RASS goal: 0 to -1 Precedex infusion CIWA ativan dosing May need to schedle some ativan as well.  Thaimine, folate. Neurology following MRI brain  INFECTIOUS A:   Suicidal ideation boyfriend reports she has been talking about suicide the past few weeks. She denies.  ETOH abuse  P:   Suicide sitter Psych consult eventually.    FAMILY  - Updates: Live-in boyfriend of 10 years updated. She is not in contact with any of her siblings (only relatives) and does not want to be. Chaplain being called to arrange POA papers for him.   - Inter-disciplinary family meet or  Palliative Care meeting due by:  6/4  Georgann Housekeeper, AGACNP-BC [place] Pulmonology/Critical Care Pager [phone] or [phone]  12/04/2017 11:28 PM

## 2017-12-05 DIAGNOSIS — F10230 Alcohol dependence with withdrawal, uncomplicated: Secondary | ICD-10-CM

## 2017-12-05 LAB — RAPID URINE DRUG SCREEN, HOSP PERFORMED
AMPHETAMINES: NOT DETECTED
Barbiturates: NOT DETECTED
Benzodiazepines: POSITIVE — AB
Cocaine: NOT DETECTED
Opiates: NOT DETECTED
TETRAHYDROCANNABINOL: NOT DETECTED

## 2017-12-05 LAB — GLUCOSE, CAPILLARY
GLUCOSE-CAPILLARY: 102 mg/dL — AB (ref 65–99)
GLUCOSE-CAPILLARY: 102 mg/dL — AB (ref 65–99)
GLUCOSE-CAPILLARY: 104 mg/dL — AB (ref 65–99)
GLUCOSE-CAPILLARY: 122 mg/dL — AB (ref 65–99)
GLUCOSE-CAPILLARY: 85 mg/dL (ref 65–99)
Glucose-Capillary: 168 mg/dL — ABNORMAL HIGH (ref 65–99)
Glucose-Capillary: 95 mg/dL (ref 65–99)

## 2017-12-05 LAB — BASIC METABOLIC PANEL
ANION GAP: 10 (ref 5–15)
ANION GAP: 13 (ref 5–15)
ANION GAP: 9 (ref 5–15)
ANION GAP: 9 (ref 5–15)
ANION GAP: 9 (ref 5–15)
ANION GAP: 9 (ref 5–15)
Anion gap: 9 (ref 5–15)
BUN: 8 mg/dL (ref 6–20)
BUN: 10 mg/dL (ref 6–20)
BUN: 10 mg/dL (ref 6–20)
BUN: 10 mg/dL (ref 6–20)
BUN: 9 mg/dL (ref 6–20)
BUN: 6 mg/dL (ref 6–20)
BUN: 6 mg/dL (ref 6–20)
CALCIUM: 7.9 mg/dL — AB (ref 8.9–10.3)
CALCIUM: 7.9 mg/dL — AB (ref 8.9–10.3)
CHLORIDE: 101 mmol/L (ref 101–111)
CHLORIDE: 101 mmol/L (ref 101–111)
CHLORIDE: 89 mmol/L — AB (ref 101–111)
CO2: 20 mmol/L — ABNORMAL LOW (ref 22–32)
CO2: 20 mmol/L — ABNORMAL LOW (ref 22–32)
CO2: 18 mmol/L — ABNORMAL LOW (ref 22–32)
CO2: 20 mmol/L — AB (ref 22–32)
CO2: 20 mmol/L — AB (ref 22–32)
CO2: 20 mmol/L — AB (ref 22–32)
CO2: 23 mmol/L (ref 22–32)
Calcium: 8.1 mg/dL — ABNORMAL LOW (ref 8.9–10.3)
Calcium: 7.6 mg/dL — ABNORMAL LOW (ref 8.9–10.3)
Calcium: 7.9 mg/dL — ABNORMAL LOW (ref 8.9–10.3)
Calcium: 8.1 mg/dL — ABNORMAL LOW (ref 8.9–10.3)
Calcium: 7.9 mg/dL — ABNORMAL LOW (ref 8.9–10.3)
Chloride: 94 mmol/L — ABNORMAL LOW (ref 101–111)
Chloride: 95 mmol/L — ABNORMAL LOW (ref 101–111)
Chloride: 100 mmol/L — ABNORMAL LOW (ref 101–111)
Chloride: 101 mmol/L (ref 101–111)
Creatinine, Ser: 0.7 mg/dL (ref 0.44–1.00)
Creatinine, Ser: 0.72 mg/dL (ref 0.44–1.00)
Creatinine, Ser: 0.69 mg/dL (ref 0.44–1.00)
Creatinine, Ser: 0.6 mg/dL (ref 0.44–1.00)
Creatinine, Ser: 0.6 mg/dL (ref 0.44–1.00)
Creatinine, Ser: 0.58 mg/dL (ref 0.44–1.00)
Creatinine, Ser: 0.53 mg/dL (ref 0.44–1.00)
GFR calc Af Amer: >60 mL/min (ref >60)
GFR calc Af Amer: >60 mL/min (ref >60)
GFR calc non Af Amer: >60 mL/min (ref >60)
GFR calc non Af Amer: >60 mL/min (ref >60)
GFR calc non Af Amer: >60 mL/min (ref >60)
GLUCOSE: 126 mg/dL — AB (ref 65–99)
GLUCOSE: 83 mg/dL (ref 65–99)
GLUCOSE: 87 mg/dL (ref 65–99)
GLUCOSE: 90 mg/dL (ref 65–99)
Glucose, Bld: 85 mg/dL (ref 65–99)
Glucose, Bld: 99 mg/dL (ref 65–99)
Glucose, Bld: 112 mg/dL — ABNORMAL HIGH (ref 65–99)
POTASSIUM: 3.1 mmol/L — AB (ref 3.5–5.1)
POTASSIUM: 3.1 mmol/L — AB (ref 3.5–5.1)
POTASSIUM: 3.3 mmol/L — AB (ref 3.5–5.1)
POTASSIUM: 3.3 mmol/L — AB (ref 3.5–5.1)
POTASSIUM: 3.6 mmol/L (ref 3.5–5.1)
POTASSIUM: 3.8 mmol/L (ref 3.5–5.1)
POTASSIUM: 4.1 mmol/L (ref 3.5–5.1)
SODIUM: 130 mmol/L — AB (ref 135–145)
SODIUM: 128 mmol/L — AB (ref 135–145)
Sodium: 122 mmol/L — ABNORMAL LOW (ref 135–145)
Sodium: 124 mmol/L — ABNORMAL LOW (ref 135–145)
Sodium: 127 mmol/L — ABNORMAL LOW (ref 135–145)
Sodium: 129 mmol/L — ABNORMAL LOW (ref 135–145)
Sodium: 130 mmol/L — ABNORMAL LOW (ref 135–145)

## 2017-12-05 LAB — CBC
HEMATOCRIT: 29 % — AB (ref 36.0–46.0)
HEMOGLOBIN: 10.3 g/dL — AB (ref 12.0–15.0)
MCH: 32.5 pg (ref 26.0–34.0)
MCHC: 35.5 g/dL (ref 30.0–36.0)
MCV: 91.5 fL (ref 78.0–100.0)
Platelets: 63 10*3/uL — ABNORMAL LOW (ref 150–400)
RBC: 3.17 MIL/uL — AB (ref 3.87–5.11)
RDW: 13.2 % (ref 11.5–15.5)
WBC: 8.4 10*3/uL (ref 4.0–10.5)

## 2017-12-05 LAB — URINALYSIS, ROUTINE W REFLEX MICROSCOPIC
BILIRUBIN URINE: NEGATIVE
GLUCOSE, UA: NEGATIVE mg/dL
Hgb urine dipstick: NEGATIVE
KETONES UR: 5 mg/dL — AB
LEUKOCYTES UA: NEGATIVE
NITRITE: NEGATIVE
PROTEIN: NEGATIVE mg/dL
Specific Gravity, Urine: 1.006 (ref 1.005–1.030)
pH: 6 (ref 5.0–8.0)

## 2017-12-05 LAB — HIV ANTIBODY (ROUTINE TESTING W REFLEX): HIV Screen 4th Generation wRfx: NONREACTIVE

## 2017-12-05 LAB — MRSA PCR SCREENING: MRSA BY PCR: NEGATIVE

## 2017-12-05 LAB — MAGNESIUM: Magnesium: 2 mg/dL (ref 1.7–2.4)

## 2017-12-05 LAB — PHOSPHORUS
PHOSPHORUS: 2.9 mg/dL (ref 2.5–4.6)
Phosphorus: 2.7 mg/dL (ref 2.5–4.6)

## 2017-12-05 LAB — LACTIC ACID, PLASMA
LACTIC ACID, VENOUS: 1.3 mmol/L (ref 0.5–1.9)
LACTIC ACID, VENOUS: 1 mmol/L (ref 0.5–1.9)
Lactic Acid, Venous: 1.9 mmol/L (ref 0.5–1.9)

## 2017-12-05 MED ORDER — DEXMEDETOMIDINE HCL IN NACL 400 MCG/100ML IV SOLN
0.4000 ug/kg/h | INTRAVENOUS | Status: DC
  Administered 2017-12-05: 0.8 ug/kg/h via INTRAVENOUS
  Administered 2017-12-05: 1.4 ug/kg/h via INTRAVENOUS
  Administered 2017-12-06: 1.6 ug/kg/h via INTRAVENOUS
  Administered 2017-12-06 (×2): 1.7 ug/kg/h via INTRAVENOUS
  Administered 2017-12-06: 1.4 ug/kg/h via INTRAVENOUS
  Administered 2017-12-06: 1.5 ug/kg/h via INTRAVENOUS
  Administered 2017-12-06 – 2017-12-07 (×2): 1.7 ug/kg/h via INTRAVENOUS
  Administered 2017-12-07: 1 ug/kg/h via INTRAVENOUS
  Administered 2017-12-07 – 2017-12-08 (×2): 1.7 ug/kg/h via INTRAVENOUS
  Administered 2017-12-08: 1.1 ug/kg/h via INTRAVENOUS
  Administered 2017-12-08 (×2): 1.4 ug/kg/h via INTRAVENOUS
  Administered 2017-12-08: 1.7 ug/kg/h via INTRAVENOUS
  Administered 2017-12-09: 1.5 ug/kg/h via INTRAVENOUS
  Administered 2017-12-09: 1.6 ug/kg/h via INTRAVENOUS
  Filled 2017-12-05 (×23): qty 100

## 2017-12-05 MED ORDER — LORAZEPAM 2 MG/ML IJ SOLN
1.0000 mg | Freq: Four times a day (QID) | INTRAMUSCULAR | Status: DC
  Administered 2017-12-05 – 2017-12-06 (×4): 1 mg via INTRAVENOUS
  Filled 2017-12-05 (×4): qty 1

## 2017-12-05 MED ORDER — DEXTROSE-NACL 5-0.45 % IV SOLN
INTRAVENOUS | Status: DC
  Administered 2017-12-05 – 2017-12-06 (×2): via INTRAVENOUS

## 2017-12-05 MED ORDER — POTASSIUM CHLORIDE 10 MEQ/100ML IV SOLN
10.0000 meq | INTRAVENOUS | Status: AC
  Administered 2017-12-05 – 2017-12-06 (×4): 10 meq via INTRAVENOUS
  Filled 2017-12-05 (×4): qty 100

## 2017-12-05 MED ORDER — SODIUM CHLORIDE 0.9 % IV BOLUS
1000.0000 mL | Freq: Once | INTRAVENOUS | Status: AC
  Administered 2017-12-05: 1000 mL via INTRAVENOUS

## 2017-12-05 MED ORDER — LIP MEDEX EX OINT
TOPICAL_OINTMENT | CUTANEOUS | Status: DC | PRN
  Filled 2017-12-05: qty 7

## 2017-12-05 MED ORDER — NICOTINE 21 MG/24HR TD PT24
21.0000 mg | MEDICATED_PATCH | Freq: Every day | TRANSDERMAL | Status: DC
  Administered 2017-12-05 – 2017-12-12 (×8): 21 mg via TRANSDERMAL
  Filled 2017-12-05 (×9): qty 1

## 2017-12-05 MED ORDER — MIDAZOLAM HCL 2 MG/2ML IJ SOLN
2.0000 mg | Freq: Once | INTRAMUSCULAR | Status: AC
  Administered 2017-12-05: 2 mg via INTRAVENOUS

## 2017-12-05 NOTE — Progress Notes (Signed)
   12/05/17 1100  Clinical Encounter Type  Visited With Patient  Visit Type Follow-up  Referral From Chaplain  Consult/Referral To Chaplain  Spiritual Encounters  Spiritual Needs Emotional  Stress Factors  Patient Stress Factors Exhausted  Family Stress Factors None identified    Pt was laying on her bed handcuffed. Direct care nurse and and restrain staff were on-site helping her. No family member on-site and chaplain provided compassionate presence.  Shritha Bresee a Medical sales representative, [employer]

## 2017-12-05 NOTE — Progress Notes (Signed)
[place] Progress Note Patient Name: Jessica [name] DOB: 1961-07-10 MRN: [id number]   Date of Service  12/05/2017  HPI/Events of Note  Agitated Delirium - Currently on a Precedex IV infusion at 1.4 mcg/kg/hour. Request for soft wrist, ankle and waist restraint.  eICU Interventions  Will order: 1. Increase ceiling on Precedex IV infusion to 1.7 mcg/kg/min. 2. Soft bilateral wrist, bilateral ankle and soft waist restraint. 3. Versed 2 mg IV now.         Sommer,Steven Eugene 12/05/2017, 1:46 AM

## 2017-12-05 NOTE — Progress Notes (Signed)
[place] Progress Note Patient Name: Jessica [name] DOB: [birth year] MRN: [id number]   Date of Service  12/05/2017  HPI/Events of Note  Hypotension - BP = 67/41. Likely d/t sedation with Precedex IV infusion and Versed IV.   eICU Interventions  Will order: 1. Bolus with 0.9 NaCl 1 liter IV over 1 hour now.  2. Decrease Precedex IV infusion as tolerated.      Intervention Category Major Interventions: Hypotension - evaluation and management  Sommer,Steven Eugene 12/05/2017, 2:59 AM

## 2017-12-05 NOTE — Progress Notes (Signed)
[place] Progress Note Patient Name: Jessica ENCALADE DOB: February 16, 1961 MRN: [id number]   Date of Service  12/05/2017  HPI/Events of Note  History of tobacco abuse - Request for nicotine patch.   eICU Interventions  Will order: 1. Nicoderm patch 21 mg to skin Q day.      Intervention Category Major Interventions: Other:  Lysle Dingwall 12/05/2017, 8:41 PM

## 2017-12-05 NOTE — Progress Notes (Signed)
Na correcting a little fast (4 points in 4 hours); will decrease NS infusion from 125cc/hr to 50cc/hr. Repeat BMP in 4hrs.

## 2017-12-05 NOTE — Progress Notes (Signed)
Was not able to document hourly vital signs from 2300 12/04/17 to 0230 12/05/17 due to patient being very agitated, combative, and noncompliant.

## 2017-12-05 NOTE — ED Notes (Signed)
Per significant other, pt has been expressing thoughts of self harm and worsening depression

## 2017-12-05 NOTE — Progress Notes (Signed)
PULMONARY / CRITICAL CARE MEDICINE   Name: Jessica [name] MRN: [id number] DOB: 1960/08/10    ADMISSION DATE:  12/04/2017 CONSULTATION DATE:  5/28  REFERRING MD:  Dr. Maryan Rued EDP.  CHIEF COMPLAINT:  Seizure  HISTORY OF PRESENT ILLNESS:   57 year old female admitted to ICU with EtOH withdrawal and witnessed seizure 5/28 with hyponatremia, sodium 116   SUBJECTIVE:  Calm on Precedex drip Afebrile.   VITAL SIGNS: BP 97/66   Pulse 77   Temp 99.4 F (37.4 C) (Oral)   Resp 13   Ht 5\' 11"  (1.803 m)   Wt 137 lb 9.1 oz (62.4 kg)   SpO2 99%   BMI 19.19 kg/m   HEMODYNAMICS:    VENTILATOR SETTINGS:    INTAKE / OUTPUT: I/O last 3 completed shifts: In: 3547.1 [I.V.:1297.1; IV Piggyback:2250] Out: 250 [Urine:250]  PHYSICAL EXAMINATION: Gen. sedate, well-nourished, in no distress ENT -ecchymosis to right orbit, no missing teeth Neck: No JVD, no thyromegaly, no carotid bruits Lungs: no use of accessory muscles, no dullness to percussion, prolonged expiration without rales or rhonchi  Cardiovascular: Rhythm regular, heart sounds  normal, no murmurs or gallops, no peripheral edema Abdomen: soft and non-tender, no hepatosplenomegaly, BS normal. Musculoskeletal: No deformities, no cyanosis or clubbing Neuro: Sedated on Precedex drip, does not follow commands    LABS:  BMET Recent Labs  Lab 12/05/17 0056 12/05/17 0359 12/05/17 0658  NA 122* 124* 127*  K 3.6 3.8 4.1  CL 89* 94* 95*  CO2 20* 20* 23  BUN 8 10 10   CREATININE 0.70 0.72 0.69  GLUCOSE 126* 83 87    Electrolytes Recent Labs  Lab 12/05/17 0056 12/05/17 0359 12/05/17 0658  CALCIUM 8.1* 7.6* 7.9*  MG  --   --  2.0  PHOS 2.7  --  2.9    CBC Recent Labs  Lab 12/04/17 2125 12/04/17 2133 12/05/17 0658  WBC  --  9.3 8.4  HGB 15.6* 13.3 10.3*  HCT 46.0 37.8 29.0*  PLT  --  119* 63*    Coag's Recent Labs  Lab 12/04/17 2133  APTT 31  INR 1.00    Sepsis Markers Recent Labs  Lab  12/05/17 0056 12/05/17 0359 12/05/17 0658  LATICACIDVEN 1.9 1.3 1.0    ABG No results for input(s): PHART, PCO2ART, PO2ART in the last 168 hours.  Liver Enzymes Recent Labs  Lab 12/04/17 2133  AST 129*  ALT 83*  ALKPHOS 66  BILITOT 1.9*  ALBUMIN 4.2    Cardiac Enzymes No results for input(s): TROPONINI, PROBNP in the last 168 hours.  Glucose Recent Labs  Lab 12/04/17 2119 12/05/17 0030 12/05/17 0507 12/05/17 0816  GLUCAP 168* 122* 104* 85    Imaging Dg Chest Port 1 View  Result Date: 12/04/2017 CLINICAL DATA:  Delirium tremens. Tachycardia, acute onset. EXAM: PORTABLE CHEST 1 VIEW COMPARISON:  None. FINDINGS: The lungs are hyperexpanded but appear grossly clear. There is no evidence of focal opacification, pleural effusion or pneumothorax. The cardiomediastinal silhouette is within normal limits. No acute osseous abnormalities are seen. IMPRESSION: Lungs hyperexpanded but grossly clear. Electronically Signed   By: Garald Balding M.D.   On: 12/04/2017 21:53   Ct Head Code Stroke Wo Contrast  Result Date: 12/04/2017 CLINICAL DATA:  Code stroke. Generalized shaking. Last seen normal at 20 100 hours. EXAM: CT HEAD WITHOUT CONTRAST TECHNIQUE: Contiguous axial images were obtained from the base of the skull through the vertex without intravenous contrast. COMPARISON:  None. FINDINGS: Moderate motion degraded examination.  BRAIN: No intraparenchymal hemorrhage, mass effect nor midline shift. No parenchymal brain volume loss for age. Cavum septum pellucidum. No hydrocephalus. No acute large vascular territory infarcts. No abnormal extra-axial fluid collections. Basal cisterns are patent. VASCULAR: Unremarkable. SKULL/SOFT TISSUES: No skull fracture. Scattered subcentimeter calvarial lucencies. No significant soft tissue swelling. ORBITS/SINUSES: The included ocular globes and orbital contents are normal.The mastoid aircells and included paranasal sinuses are well-aerated. OTHER: None.  ASPECTS [facility] Stroke Program Early CT Score) - Ganglionic level infarction (caudate, lentiform nuclei, internal capsule, insula, M1-M3 cortex): 7 - Supraganglionic infarction (M4-M6 cortex): 3 Total score (0-10 with 10 being normal): 10 IMPRESSION: 1. Moderate motion degraded examination without acute intracranial process. 2. ASPECTS is 10. 3. Scattered subcentimeter calvarial lucencies could reflect hemangiomas though if there is a history of cancer, metastatic disease is possible. 4. Critical Value/emergent results text paged to [place], Neurology via AMION secure system on 12/04/2017 at 9:35 pm, including interpreting physician's phone number. Electronically Signed   By: Elon Alas M.D.   On: 12/04/2017 21:36   Ct Maxillofacial Wo Contrast  Result Date: 12/04/2017 CLINICAL DATA:  Blunt maxillofacial trauma EXAM: CT MAXILLOFACIAL WITHOUT CONTRAST TECHNIQUE: Multidetector CT imaging of the maxillofacial structures was performed. Multiplanar CT image reconstructions were also generated. COMPARISON:  Partial comparison to CT head dated 12/04/2017 at 2126 hours FINDINGS: Moderate to severe motion degradation, severely limiting evaluation. Study is almost nondiagnostic, particularly involving the mandible. Osseous: No gross maxillofacial fracture, noting severe motion degradation. Bilateral mandibular condyles appear well-seated in the TMJs. Left frontal calvarial lucency (image 99), incompletely visualized, better evaluated on dedicated CT. Orbits: Bilateral orbits, including the globes and retroconal soft tissues, are grossly unremarkable. Sinuses: Visualized paranasal sinuses and mastoid air cells are grossly clear. Soft tissues: Negative. Limited intracranial: Grossly unremarkable, better evaluated on dedicated head CT. IMPRESSION: Evaluation is severely limited due to motion degradation. Nondiagnostic evaluation of the mandible. Within that constraint, no gross maxillofacial fracture is seen.  Electronically Signed   By: Julian Hy M.D.   On: 12/04/2017 21:46    STUDIES:  CT head/maxillofacial 5/28 > Evaluation is severely limited due to motion degradation. Nondiagnostic evaluation of the mandible. Within that constraint, no gross maxillofacial fracture is seen. Moderate motion degraded examination without acute intracranial process. Scattered subcentimeter calvarial lucencies could reflect hemangiomas though if there is a history of cancer, metastatic disease is possible.  CULTURES:   ANTIBIOTICS:   SIGNIFICANT EVENTS: 5/28 > seizure. Admit to ICU for hyponatremia and ETOH withdrawal.   LINES/TUBES:   DISCUSSION: 57 year old female who is an every day drinker admitted after witnessed seizure at home. Has cut back on drinking last few days due to nausea. Sodium 118 on admission. Admitted to ICU for precedex infusion and close soidum monitoring.  ASSESSMENT / PLAN:  NEUROLOGIC A:   Seizure likely secondary to hyponatremia, alcohol withdrawal.  ETOH withdrawal with delirium tremens.   P:   RASS goal: 0 to -1 Precedex infusion CIWA ativan dosing We will start scheduled Ativan to enable Precedex taper Thaimine, folate. Neurology following   RENAL A:   Hyponatremia:  Beer potomania (can this occur with wine? She does sometimes drink beer) vs polydipsia ("gallons" of water per day).  Sodium has corrected by 1 mEq/h Hypokalemia AG acidosis (lactic in setting of seizure. LA 11 on admit)  P:   BMP now and q 4 hours to closely monitor electrolytes We will start D5 half-normal saline @75 /h  to slow down rise of sodium  will make NPO for now with aspiration risk.   GASTROINTESTINAL  A:   Transaminitis  P:  Trend LFT Consider liver imaging if unresolved, or once acute phase passed.  NPO  HEMATOLOGIC A:   Thrombocytopenia r/t alcoholism  P:  Follow CBC Heparin SQ for VTE ppx.   A:   Suicidal ideation boyfriend reports she has been talking about  suicide the past few weeks. She denies.   P:   Suicide sitter Psych consult eventually.    FAMILY  - Updates: Live-in boyfriend of 10 years updated. She is not in contact with any of her siblings (only relatives) and does not want to be. Chaplain being called to arrange POA papers for him.   - Inter-disciplinary family meet or Palliative Care meeting due by:  6/4  My cc time x 27m  Kara Mead MD. [facility]. [place] Pulmonary & Critical care Pager [phone] If no response call [phone]     12/05/2017 10:34 AM

## 2017-12-06 DIAGNOSIS — F1994 Other psychoactive substance use, unspecified with psychoactive substance-induced mood disorder: Secondary | ICD-10-CM

## 2017-12-06 LAB — GLUCOSE, CAPILLARY
GLUCOSE-CAPILLARY: 108 mg/dL — AB (ref 65–99)
GLUCOSE-CAPILLARY: 113 mg/dL — AB (ref 65–99)
GLUCOSE-CAPILLARY: 115 mg/dL — AB (ref 65–99)
GLUCOSE-CAPILLARY: 133 mg/dL — AB (ref 65–99)
Glucose-Capillary: 141 mg/dL — ABNORMAL HIGH (ref 65–99)
Glucose-Capillary: 106 mg/dL — ABNORMAL HIGH (ref 65–99)
Glucose-Capillary: 131 mg/dL — ABNORMAL HIGH (ref 65–99)

## 2017-12-06 LAB — BASIC METABOLIC PANEL
ANION GAP: 8 (ref 5–15)
Anion gap: 10 (ref 5–15)
BUN: 6 mg/dL (ref 6–20)
BUN: 6 mg/dL (ref 6–20)
CHLORIDE: 104 mmol/L (ref 101–111)
CHLORIDE: 105 mmol/L (ref 101–111)
CO2: 21 mmol/L — ABNORMAL LOW (ref 22–32)
CO2: 20 mmol/L — ABNORMAL LOW (ref 22–32)
Calcium: 8.2 mg/dL — ABNORMAL LOW (ref 8.9–10.3)
Calcium: 8.5 mg/dL — ABNORMAL LOW (ref 8.9–10.3)
Creatinine, Ser: 0.52 mg/dL (ref 0.44–1.00)
Creatinine, Ser: 0.54 mg/dL (ref 0.44–1.00)
GFR calc Af Amer: >60 mL/min (ref >60)
GFR calc non Af Amer: >60 mL/min (ref >60)
GFR calc non Af Amer: >60 mL/min (ref >60)
Glucose, Bld: 141 mg/dL — ABNORMAL HIGH (ref 65–99)
Glucose, Bld: 131 mg/dL — ABNORMAL HIGH (ref 65–99)
POTASSIUM: 3.4 mmol/L — AB (ref 3.5–5.1)
POTASSIUM: 3.3 mmol/L — AB (ref 3.5–5.1)
SODIUM: 133 mmol/L — AB (ref 135–145)
SODIUM: 135 mmol/L (ref 135–145)

## 2017-12-06 LAB — CBC
HEMATOCRIT: 28.7 % — AB (ref 36.0–46.0)
Hemoglobin: 10 g/dL — ABNORMAL LOW (ref 12.0–15.0)
MCH: 32.4 pg (ref 26.0–34.0)
MCHC: 34.8 g/dL (ref 30.0–36.0)
MCV: 92.9 fL (ref 78.0–100.0)
Platelets: 62 10*3/uL — ABNORMAL LOW (ref 150–400)
RBC: 3.09 MIL/uL — AB (ref 3.87–5.11)
RDW: 13.5 % (ref 11.5–15.5)
WBC: 6.4 10*3/uL (ref 4.0–10.5)

## 2017-12-06 LAB — COMPREHENSIVE METABOLIC PANEL
ALBUMIN: 3.1 g/dL — AB (ref 3.5–5.0)
ALT: 51 U/L (ref 14–54)
AST: 60 U/L — AB (ref 15–41)
Alkaline Phosphatase: 56 U/L (ref 38–126)
Anion gap: 9 (ref 5–15)
BILIRUBIN TOTAL: 1.5 mg/dL — AB (ref 0.3–1.2)
BUN: 5 mg/dL — AB (ref 6–20)
CHLORIDE: 104 mmol/L (ref 101–111)
CO2: 20 mmol/L — ABNORMAL LOW (ref 22–32)
CREATININE: 0.51 mg/dL (ref 0.44–1.00)
Calcium: 8.3 mg/dL — ABNORMAL LOW (ref 8.9–10.3)
GFR calc Af Amer: >60 mL/min (ref >60)
GLUCOSE: 141 mg/dL — AB (ref 65–99)
Potassium: 3.4 mmol/L — ABNORMAL LOW (ref 3.5–5.1)
Sodium: 133 mmol/L — ABNORMAL LOW (ref 135–145)
TOTAL PROTEIN: 5.8 g/dL — AB (ref 6.5–8.1)

## 2017-12-06 MED ORDER — POTASSIUM CHLORIDE CRYS ER 20 MEQ PO TBCR
40.0000 meq | EXTENDED_RELEASE_TABLET | Freq: Once | ORAL | Status: DC
  Filled 2017-12-06: qty 2

## 2017-12-06 MED ORDER — HALOPERIDOL LACTATE 5 MG/ML IJ SOLN
4.0000 mg | Freq: Once | INTRAMUSCULAR | Status: AC
  Administered 2017-12-06: 4 mg via INTRAVENOUS
  Filled 2017-12-06: qty 1

## 2017-12-06 MED ORDER — LORAZEPAM 2 MG/ML IJ SOLN
1.0000 mg | INTRAMUSCULAR | Status: DC | PRN
  Administered 2017-12-07 – 2017-12-10 (×5): 2 mg via INTRAVENOUS
  Filled 2017-12-06: qty 2
  Filled 2017-12-06 (×5): qty 1

## 2017-12-06 NOTE — Progress Notes (Signed)
PULMONARY / CRITICAL CARE MEDICINE   Name: Jessica [name] MRN: [id number] DOB: 1960-08-22    ADMISSION DATE:  12/04/2017 CONSULTATION DATE:  5/28  REFERRING MD:  Dr. Maryan Rued EDP.  CHIEF COMPLAINT:  Seizure  HISTORY OF PRESENT ILLNESS:   57 year old female admitted to ICU with EtOH withdrawal and witnessed seizure 5/28 with hyponatremia, sodium 116   SUBJECTIVE:  Rn reports agitation after ativan given overnight Calm on Precedex drip this am, wants restraints released Afebrile.   VITAL SIGNS: BP (!) 140/96 Comment: pt agitated  Pulse 69   Temp 97.7 F (36.5 C) (Oral) Comment: sitter   Resp 17   Ht 5\' 11"  (1.803 m)   Wt 134 lb 14.7 oz (61.2 kg)   SpO2 100%   BMI 18.82 kg/m   HEMODYNAMICS:    VENTILATOR SETTINGS:    INTAKE / OUTPUT: I/O last 3 completed shifts: In: 7009.9 [P.O.:1130; I.V.:3279.9; IV Piggyback:2600] Out: 2180 [Urine:2180]  PHYSICAL EXAMINATION: Calm, sitting up in bed, able to interact, no distress Ecchymosis to right orbit Awake, some slurred speech, mild tremors No JVD, no edema Prolonged expiration, no rhonchi no accessory muscle use S1-S2 normal Soft nontender abdomen    LABS:  BMET Recent Labs  Lab 12/05/17 1943 12/05/17 2248 12/06/17 0354  NA 130* 128* 133*  133*  K 3.1* 3.1* 3.4*  3.4*  CL 101 101 104  104  CO2 20* 18* 20*  21*  BUN 6 6 5*  6  CREATININE 0.58 0.53 0.51  0.52  GLUCOSE 99 112* 141*  141*    Electrolytes Recent Labs  Lab 12/05/17 0056  12/05/17 0658  12/05/17 1943 12/05/17 2248 12/06/17 0354  CALCIUM 8.1*   < > 7.9*   < > 8.1* 7.9* 8.3*  8.2*  MG  --   --  2.0  --   --   --   --   PHOS 2.7  --  2.9  --   --   --   --    < > = values in this interval not displayed.    CBC Recent Labs  Lab 12/04/17 2133 12/05/17 0658 12/06/17 0354  WBC 9.3 8.4 6.4  HGB 13.3 10.3* 10.0*  HCT 37.8 29.0* 28.7*  PLT 119* 63* 62*    Coag's Recent Labs  Lab 12/04/17 2133  APTT 31  INR 1.00     Sepsis Markers Recent Labs  Lab 12/05/17 0056 12/05/17 0359 12/05/17 0658  LATICACIDVEN 1.9 1.3 1.0    ABG No results for input(s): PHART, PCO2ART, PO2ART in the last 168 hours.  Liver Enzymes Recent Labs  Lab 12/04/17 2133 12/06/17 0354  AST 129* 60*  ALT 83* 51  ALKPHOS 66 56  BILITOT 1.9* 1.5*  ALBUMIN 4.2 3.1*    Cardiac Enzymes No results for input(s): TROPONINI, PROBNP in the last 168 hours.  Glucose Recent Labs  Lab 12/05/17 1132 12/05/17 1545 12/05/17 2007 12/05/17 2326 12/06/17 0332 12/06/17 0833  GLUCAP 95 102* 102* 113* 141* 106*    Imaging No results found.  STUDIES:  CT head/maxillofacial 5/28 > Evaluation is severely limited due to motion degradation. Nondiagnostic evaluation of the mandible. Within that constraint, no gross maxillofacial fracture is seen. Moderate motion degraded examination without acute intracranial process. Scattered subcentimeter calvarial lucencies could reflect hemangiomas though if there is a history of cancer, metastatic disease is possible.  CULTURES:   ANTIBIOTICS:   SIGNIFICANT EVENTS: 5/28 > seizure. Admit to ICU for hyponatremia and ETOH withdrawal.  LINES/TUBES:   DISCUSSION: 57 year old female who is an every day drinker admitted after witnessed seizure at home. Has cut back on drinking last few days due to nausea. Sodium 118 on admission. Admitted to ICU for precedex infusion and close soidum monitoring.  ASSESSMENT / PLAN:  NEUROLOGIC A:   Seizure likely secondary to hyponatremia, alcohol withdrawal.  ETOH withdrawal with delirium tremens.   P:   RASS goal: 0  Precedex infusion, taper as tolerated CIWA ativan dosing We will start scheduled Ativan to enable Precedex taper Thaimine, folate.    RENAL A:   Hyponatremia:  Beer potomania (can this occur with wine? She does sometimes drink beer) vs polydipsia ("gallons" of water per day) >> improved Hypokalemia AG acidosis (lactic in  setting of seizure. LA 11 on admit)  P:   BMP q 12h hours  Ct D5 half-normal saline @50 /h    GASTROINTESTINAL  A:   Transaminitis - improved  P:  Trend LFT Advance PO  HEMATOLOGIC A:   Thrombocytopenia r/t alcoholism  P:  Follow CBC Dc Heparin SQ  Ct SCDs oob   A:   Suicidal ideation boyfriend reports she has been talking about suicide the past few weeks. She denies.   P:   Suicide sitter Psych consult eventually.  SW consult for bruising   FAMILY  - Updates: Live-in boyfriend of 10 years updated. She is not in contact with any of her siblings (only relatives) and does not want to be.  - Inter-disciplinary family meet or Palliative Care meeting due by:  6/4  My cc time x 68m  Kara Mead MD. [facility]. [facility] Pager [phone] If no response call [phone]     12/06/2017 10:11 AM

## 2017-12-06 NOTE — Progress Notes (Signed)
[facility] Progress Note Patient Name: Jessica [name] DOB: December 26, 1960 MRN: [id number]   Date of Service  12/06/2017  HPI/Events of Note  Pt more agitated.    eICU Interventions  Will give 4 mg haldol IV x one.  Will adjust dose and frequency of ativan based on CIWA score.  Has precedex ordered.         Merrilyn Legler 12/06/2017, 9:13 PM

## 2017-12-06 NOTE — Progress Notes (Signed)
PT Cancellation Note  Patient Details Name: Jessica [name] MRN: [id number] DOB: 1960/12/14   Cancelled Treatment:    Reason Eval/Treat Not Completed: Patient not medically ready(Chart reviewed, RN consulted who reports patient is still agitated and labile, actively in withdrawl. RN reports PT evaluation wil;l likely be more fruitful if attempted tomorrow. )  3:56 PM, 12/06/17 Etta Grandchild, PT, DPT Physical Therapist - [place] [phone] (Pager)  [phone] (Office)      Buccola,Allan C 12/06/2017, 3:55 PM

## 2017-12-06 NOTE — Consult Note (Signed)
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 (PRECEDEX) 400 MCG/100ML (4 mcg/mL) infusion  0.4-1.7 mcg/kg/hr Intravenous Titrated Anders Simmonds, MD 21.5 mL/hr at 12/06/17 1214 1.4 mcg/kg/hr at 12/06/17 1214  . dextrose 5 %-0.45 % sodium chloride infusion   Intravenous Continuous Rigoberto Noel, MD 50 mL/hr at 12/06/17 1200    . folic acid injection 1 mg   1 mg Intravenous Daily  Hammonds, Sharyn Blitz, MD   1 mg at 12/06/17 0935  . insulin aspart (novoLOG) injection 1-3 Units  1-3 Units Subcutaneous Q4H Hammonds, Sharyn Blitz, MD   1 Units at 12/06/17 [phone]  . lip balm (CARMEX) ointment   Topical PRN Hammonds, Sharyn Blitz, MD      . LORazepam (ATIVAN) injection 1 mg  1 mg Intravenous Q6H Kara Mead V, MD   1 mg at 12/06/17 0937  . nicotine (NICODERM CQ - dosed in mg/24 hours) patch 21 mg  21 mg Transdermal Daily Anders Simmonds, MD   21 mg at 12/06/17 7672  . ondansetron (ZOFRAN) injection 4 mg  4 mg Intravenous Q6H PRN Hammonds, Sharyn Blitz, MD      . potassium chloride SA (K-DUR,KLOR-CON) CR tablet 40 mEq  40 mEq Oral Once Rigoberto Noel, MD   Stopped at 12/06/17 1243  . thiamine (VITAMIN B-1) tablet 100 mg  100 mg Oral Daily Plunkett, Loree Fee, MD       Or  . thiamine (B-1) injection 100 mg  100 mg Intravenous Daily Blanchie Dessert, MD   100 mg at 12/06/17 0935    Musculoskeletal: Strength & Muscle Tone: UTA since patient was in 2 point soft restraints. Gait & Station: UTA since patient was in restraints. Patient leans: N/A  Psychiatric Specialty Exam: Physical Exam  Nursing note and vitals reviewed. Constitutional: She is oriented to person, place, and time. She appears well-developed.  thin  HENT:  Head: Normocephalic and atraumatic.  Neck: Normal range of motion.  Respiratory: Effort normal.  Musculoskeletal: Normal range of motion.  Neurological: She is alert and oriented to person, place, and time.  Skin: No rash noted.    Review of Systems  Constitutional: Negative for chills and fever.  Cardiovascular: Negative for chest pain.  Gastrointestinal: Negative for abdominal pain, constipation, diarrhea, nausea and vomiting.  Psychiatric/Behavioral: Negative for depression and suicidal ideas.  All other systems reviewed and are negative.   Blood pressure (!) 137/92, pulse 70, temperature 97.8 F (36.6 C), temperature source  Axillary, resp. rate 13, height '5\' 11"'  (1.803 m), weight 61.2 kg (134 lb 14.7 oz), SpO2 100 %.Body mass index is 18.82 kg/m.  General Appearance: Fairly Groomed, thin, Caucasian female, wearing a hospital gown with short hair and right periorbital hematoma who is lying in bed. NAD.   Eye Contact:  Good  Speech:  Clear and Coherent and Normal Rate  Volume:  Normal  Mood:  "Pretty good."  Affect:  Constricted  Thought Process:  Goal Directed, Linear and Descriptions of Associations: Intact  Orientation:  Full (Time, Place, and Person)  Thought Content:  Logical  Suicidal Thoughts:  No  Homicidal Thoughts:  No  Memory:  Immediate;   Fair Recent;   Fair Remote;   Fair  Judgement:  Poor  Insight:  Poor  Psychomotor Activity:  Normal  Concentration:  Concentration: Fair and Attention Span: Fair  Recall:  [employer] of Knowledge:  Good  Language:  Good  Akathisia:  No  Handed:  Right  AIMS (if indicated):   N/A  Assets:  Agricultural consultant Housing Intimacy Social Support  ADL's:  Impaired  Cognition:  Mild short term memory deficits.   Sleep:   Okay   Assessment:  Jessica [name] is a 57 y.o. female who was admitted with alcohol withdrawal seizure. Psychiatry was consulted because patient recently endorsed SI to her boyfriend. Patient reports her mood is relatively stable although her affect  was constricted. She denies SI, HI or AVH. She reports problematic alcohol use although she declines substance abuse resources. Her boyfriend reports that she has been depressed in the setting of alcohol use. She has not endorsed SI for 1.5 weeks or demonstrated unsafe behaviors. There is no access to weapons at home. Recommend starting Remeron for depressed mood and poor appetite.   Treatment Plan Summary: -Start Remeron 15 mg qhs for mood and appetite.  -May consider starting Gabapentin 300 mg TID for alcohol cravings. -Please have unit SW provide patient with  resources for local psychiatrists.  -Patient declines substance abuse resources.  -Psychiatry will sign off on patient at this time. Please consult psychiatry again as needed.    Disposition: No evidence of imminent risk to self or others at present.   Patient does not meet criteria for psychiatric inpatient admission.  Faythe Dingwall, DO 12/06/2017 12:51 PM
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
## 2017-12-07 LAB — CBC
HEMATOCRIT: 29.6 % — AB (ref 36.0–46.0)
Hemoglobin: 10.1 g/dL — ABNORMAL LOW (ref 12.0–15.0)
MCH: 32.4 pg (ref 26.0–34.0)
MCHC: 34.1 g/dL (ref 30.0–36.0)
MCV: 94.9 fL (ref 78.0–100.0)
Platelets: 78 10*3/uL — ABNORMAL LOW (ref 150–400)
RBC: 3.12 MIL/uL — ABNORMAL LOW (ref 3.87–5.11)
RDW: 13.6 % (ref 11.5–15.5)
WBC: 6.6 10*3/uL (ref 4.0–10.5)

## 2017-12-07 LAB — BASIC METABOLIC PANEL
Anion gap: 8 (ref 5–15)
BUN: 6 mg/dL (ref 6–20)
CHLORIDE: 105 mmol/L (ref 101–111)
CO2: 18 mmol/L — ABNORMAL LOW (ref 22–32)
Calcium: 8.2 mg/dL — ABNORMAL LOW (ref 8.9–10.3)
Creatinine, Ser: 0.57 mg/dL (ref 0.44–1.00)
GFR calc Af Amer: >60 mL/min (ref >60)
GFR calc non Af Amer: >60 mL/min (ref >60)
GLUCOSE: 131 mg/dL — AB (ref 65–99)
POTASSIUM: 3.1 mmol/L — AB (ref 3.5–5.1)
Sodium: 131 mmol/L — ABNORMAL LOW (ref 135–145)

## 2017-12-07 LAB — GLUCOSE, CAPILLARY
GLUCOSE-CAPILLARY: 110 mg/dL — AB (ref 65–99)
GLUCOSE-CAPILLARY: 198 mg/dL — AB (ref 65–99)
GLUCOSE-CAPILLARY: 119 mg/dL — AB (ref 65–99)
Glucose-Capillary: 148 mg/dL — ABNORMAL HIGH (ref 65–99)
Glucose-Capillary: 148 mg/dL — ABNORMAL HIGH (ref 65–99)

## 2017-12-07 LAB — PHOSPHORUS: Phosphorus: 1.9 mg/dL — ABNORMAL LOW (ref 2.5–4.6)

## 2017-12-07 LAB — MAGNESIUM: Magnesium: 1.3 mg/dL — ABNORMAL LOW (ref 1.7–2.4)

## 2017-12-07 MED ORDER — MAGNESIUM SULFATE 50 % IJ SOLN
3.0000 g | Freq: Once | INTRAMUSCULAR | Status: AC
  Administered 2017-12-07: 3 g via INTRAVENOUS
  Filled 2017-12-07: qty 6

## 2017-12-07 MED ORDER — POTASSIUM PHOSPHATES 15 MMOLE/5ML IV SOLN
40.0000 mmol | Freq: Once | INTRAVENOUS | Status: AC
  Administered 2017-12-07: 40 mmol via INTRAVENOUS
  Filled 2017-12-07: qty 13.33

## 2017-12-07 MED ORDER — FOLIC ACID 1 MG PO TABS
1.0000 mg | ORAL_TABLET | Freq: Every day | ORAL | Status: DC
  Administered 2017-12-07 – 2017-12-12 (×6): 1 mg via ORAL
  Filled 2017-12-07 (×6): qty 1

## 2017-12-07 MED ORDER — LORAZEPAM 1 MG PO TABS
1.0000 mg | ORAL_TABLET | Freq: Three times a day (TID) | ORAL | Status: DC
  Administered 2017-12-07 – 2017-12-09 (×6): 1 mg via ORAL
  Filled 2017-12-07 (×6): qty 1

## 2017-12-07 MED ORDER — MIRTAZAPINE 15 MG PO TBDP
15.0000 mg | ORAL_TABLET | Freq: Every day | ORAL | Status: DC
  Administered 2017-12-07 – 2017-12-11 (×5): 15 mg via ORAL
  Filled 2017-12-07 (×6): qty 1

## 2017-12-07 MED ORDER — POTASSIUM PHOSPHATES 15 MMOLE/5ML IV SOLN: 40.0000 meq | Freq: Once | INTRAVENOUS | Status: DC

## 2017-12-07 MED ORDER — DEXTROSE-NACL 5-0.9 % IV SOLN: INTRAVENOUS | Status: DC

## 2017-12-07 NOTE — Evaluation (Signed)
Physical Therapy Evaluation Patient Details Name: Jessica [name] MRN: [id number] DOB: 1961-05-04 Today's Date: 12/07/2017   History of Present Illness  57 year old female admitted to ICU with EtOH withdrawal and witnessed seizure 5/28 with hyponatremia, sodium 116.  Clinical Impression  Pt admitted with above diagnosis. Pt currently with functional limitations due to the deficits listed below (see PT Problem List). Pt was able to ambulate with min assist and cues for safety.  Needs continued PT.  Pt tearful during session.  Will follow acutely.   Pt will benefit from skilled PT to increase their independence and safety with mobility to allow discharge to the venue listed below.      Follow Up Recommendations SNF;Supervision/Assistance - 24 hour    Equipment Recommendations  Other (comment)(TBA)    Recommendations for Other Services       Precautions / Restrictions Precautions Precautions: Fall Restrictions Weight Bearing Restrictions: No      Mobility  Bed Mobility Overal bed mobility: Needs Assistance Bed Mobility: Supine to Sit     Supine to sit: Supervision;Min guard     General bed mobility comments: cues for technique and safety  Transfers Overall transfer level: Needs assistance Equipment used: None Transfers: Sit to/from Stand Sit to Stand: Min guard         General transfer comment: Steadying assist given as pt with slight unsteadiness  Ambulation/Gait Ambulation/Gait assistance: Min assist;+2 safety/equipment Ambulation Distance (Feet): 155 Feet Assistive device: None;IV Pole Gait Pattern/deviations: Step-through pattern;Decreased stride length;Drifts right/left   Gait velocity interpretation: 1.31 - 2.62 ft/sec, indicative of limited community ambulator General Gait Details: Pt needed steadying assist at times.  Pt was holding onto IV pole at times and at times was able to walk without UE support.  Min tactile assist given as pt relies on this for  stability.   Stairs            Wheelchair Mobility    Modified Rankin (Stroke Patients Only)       Balance Overall balance assessment: Needs assistance;History of Falls Sitting-balance support: No upper extremity supported;Feet supported Sitting balance-Leahy Scale: Fair     Standing balance support: Bilateral upper extremity supported;During functional activity Standing balance-Leahy Scale: Poor Standing balance comment: relies on Ue support for static and dynamic balance.                              Pertinent Vitals/Pain Pain Assessment: No/denies pain  VSS  Home Living Family/patient expects to be discharged to:: Private residence Living Arrangements: Spouse/significant other;Other (Comment)(boyfriend) Available Help at Discharge: Friend(s);Available PRN/intermittently(boyfriend works) Type of Home: House Home Access: Stairs to enter Entrance Stairs-Rails: None Technical brewer of Steps: 3 Home Layout: One level Home Equipment: None      Prior Function Level of Independence: Independent               Hand Dominance        Extremity/Trunk Assessment   Upper Extremity Assessment Upper Extremity Assessment: Defer to OT evaluation    Lower Extremity Assessment Lower Extremity Assessment: Generalized weakness    Cervical / Trunk Assessment Cervical / Trunk Assessment: Normal  Communication   Communication: No difficulties  Cognition Arousal/Alertness: Awake/alert Behavior During Therapy: Impulsive;Anxious(tearful, labile) Overall Cognitive Status: Impaired/Different from baseline Area of Impairment: Following commands;Safety/judgement;Problem solving                       Following Commands: Follows one  step commands with increased time Safety/Judgement: Decreased awareness of safety;Decreased awareness of deficits   Problem Solving: Slow processing;Decreased initiation;Difficulty sequencing;Requires verbal  cues;Requires tactile cues        General Comments General comments (skin integrity, edema, etc.): Pt was in waist and bil LE restraints on arrival.  Sitter in room.  Left pt with restraints at end of treatment.     Exercises     Assessment/Plan    PT Assessment Patient needs continued PT services  PT Problem List Decreased activity tolerance;Decreased balance;Decreased mobility;Decreased knowledge of use of DME;Decreased safety awareness;Decreased cognition;Decreased coordination       PT Treatment Interventions DME instruction;Gait training;Functional mobility training;Therapeutic activities;Therapeutic exercise;Balance training;Patient/family education    PT Goals (Current goals can be found in the Care Plan section)  Acute Rehab PT Goals Patient Stated Goal: to get better PT Goal Formulation: With patient Time For Goal Achievement: 12/21/17 Potential to Achieve Goals: Good    Frequency Min 3X/week   Barriers to discharge Decreased caregiver support      Co-evaluation               AM-PAC PT "6 Clicks" Daily Activity  Outcome Measure Difficulty turning over in bed (including adjusting bedclothes, sheets and blankets)?: None Difficulty moving from lying on back to sitting on the side of the bed? : A Lot Difficulty sitting down on and standing up from a chair with arms (e.g., wheelchair, bedside commode, etc,.)?: A Little Help needed moving to and from a bed to chair (including a wheelchair)?: A Little Help needed walking in hospital room?: A Little Help needed climbing 3-5 steps with a railing? : A Lot 6 Click Score: 17    End of Session Equipment Utilized During Treatment: Gait belt Activity Tolerance: Patient limited by fatigue Patient left: in bed;with call bell/phone within reach;with bed alarm set;with nursing/sitter in room;with restraints reapplied Nurse Communication: Mobility status PT Visit Diagnosis: Unsteadiness on feet (R26.81)    Time:  3491-7915 PT Time Calculation (min) (ACUTE ONLY): 23 min   Charges:   PT Evaluation $PT Eval Moderate Complexity: 1 Mod PT Treatments $Gait Training: 8-22 mins   PT G Codes:        Jessica [name],PT Acute Rehabilitation [phone] (pager)   Jessica [name] 12/07/2017, 12:49 PM

## 2017-12-07 NOTE — Progress Notes (Addendum)
PULMONARY / CRITICAL CARE MEDICINE   Name: Jessica [name] MRN: [id number] DOB: [birth year]    ADMISSION DATE:  12/04/2017 CONSULTATION DATE:  5/28  REFERRING MD:  Dr. Maryan Rued EDP.  CHIEF COMPLAINT:  Seizure  HISTORY OF PRESENT ILLNESS:   57 year old female admitted to ICU with EtOH withdrawal and witnessed seizure 5/28 with hyponatremia, sodium 116   SUBJECTIVE:  Increased agitation again over night, calm at present on precedex at 1.7 mcg/kg/hr Haldol started and Ativan does adjusted for CIWA score Afebrile. States she is feeling better, is asking for scissors to cut restraints   VITAL SIGNS: BP (!) 131/105   Pulse 75   Temp 98.6 F (37 C) (Oral)   Resp (!) 21   Ht 5\' 11"  (1.803 m)   Wt 137 lb 12.6 oz (62.5 kg)   SpO2 100%   BMI 19.22 kg/m   HEMODYNAMICS:    VENTILATOR SETTINGS:    INTAKE / OUTPUT: I/O last 3 completed shifts: In: 3543.9 [P.O.:120; I.V.:3023.9; IV Piggyback:400] Out: 2555 [Urine:2555]  PHYSICAL EXAMINATION: Calm, supine in bed, answering questions, calm Ecchymosis to right orbit, bruises notes arms and legs Awake, some slurred speech, mild tremors, alert to self only ( Thinks she is in an infirmary) No JVD, no edema,  S1, S2, RRR No RMG, SR per tele Prolonged expiration, no rhonchi no accessory muscle use, diminished per bases Soft nontender , ND, abdomen, Non-obese    LABS:  BMET Recent Labs  Lab 12/06/17 0354 12/06/17 1539 12/07/17 0400  NA 133*  133* 135 131*  K 3.4*  3.4* 3.3* 3.1*  CL 104  104 105 105  CO2 20*  21* 20* 18*  BUN 5*  6 6 6   CREATININE 0.51  0.52 0.54 0.57  GLUCOSE 141*  141* 131* 131*    Electrolytes Recent Labs  Lab 12/05/17 0056  12/05/17 0658  12/06/17 0354 12/06/17 1539 12/07/17 0400  CALCIUM 8.1*   < > 7.9*   < > 8.3*  8.2* 8.5* 8.2*  MG  --   --  2.0  --   --   --  1.3*  PHOS 2.7  --  2.9  --   --   --  1.9*   < > = values in this interval not displayed.    CBC Recent Labs  Lab  12/05/17 0658 12/06/17 0354 12/07/17 0400  WBC 8.4 6.4 6.6  HGB 10.3* 10.0* 10.1*  HCT 29.0* 28.7* 29.6*  PLT 63* 62* 78*    Coag's Recent Labs  Lab 12/04/17 2133  APTT 31  INR 1.00    Sepsis Markers Recent Labs  Lab 12/05/17 0056 12/05/17 0359 12/05/17 0658  LATICACIDVEN 1.9 1.3 1.0    ABG No results for input(s): PHART, PCO2ART, PO2ART in the last 168 hours.  Liver Enzymes Recent Labs  Lab 12/04/17 2133 12/06/17 0354  AST 129* 60*  ALT 83* 51  ALKPHOS 66 56  BILITOT 1.9* 1.5*  ALBUMIN 4.2 3.1*    Cardiac Enzymes No results for input(s): TROPONINI, PROBNP in the last 168 hours.  Glucose Recent Labs  Lab 12/06/17 1225 12/06/17 1621 12/06/17 2004 12/06/17 2344 12/07/17 0432 12/07/17 0725  GLUCAP 108* 133* 131* 115* 148* 110*    Imaging No results found.  STUDIES:  CT head/maxillofacial 5/28 > Evaluation is severely limited due to motion degradation. Nondiagnostic evaluation of the mandible. Within that constraint, no gross maxillofacial fracture is seen. Moderate motion degraded examination without acute intracranial process. Scattered subcentimeter calvarial lucencies  could reflect hemangiomas though if there is a history of cancer, metastatic disease is possible.  CULTURES:   ANTIBIOTICS:   SIGNIFICANT EVENTS: 5/28 > seizure. Admit to ICU for hyponatremia and ETOH withdrawal.   LINES/TUBES:   DISCUSSION: 57 year old female who is an every day drinker admitted after witnessed seizure at home. Has cut back on drinking last few days due to nausea. Sodium 118 on admission. Admitted to ICU for precedex infusion and close soidum monitoring.  ASSESSMENT / PLAN:  NEUROLOGIC A:   Seizure likely secondary to hyponatremia, alcohol withdrawal.  ETOH withdrawal with delirium tremens.  Remains on precedex at 1.7  P:   RASS goal: 0  Precedex infusion, wean and taper as tolerated CIWA ativan dosing Q 4 for score > 8 Consider starting  Gabapentin 300 mg TID for alcohol cravings.  Thaimine, folate. Trend Na Monitor for further seizures    RENAL A:   Hyponatremia:  Beer potomania (can this occur with wine? She does sometimes drink beer) vs polydipsia ("gallons" of water per day) >> improved Hypokalemia AG acidosis (lactic in setting of seizure. LA 11 on admit) corrected to 1.0 on 5/29  P:   BMP q 12h hours  Monitor urine output Consider D5.9 NS as sodium has dropped last 24( Poor diet intake per nursing) Plan to D/C  IVF once better intake with Remeron on board Replete electrolytes as needed( K Phos infusion as ordered 5/31)  GASTROINTESTINAL  A:   Transaminitis - improved  P:  Trend LFT on  6/1 Advance PO as tolerated Per psych, start Remeron 15 mg qhs for mood and appetite.    HEMATOLOGIC A: Thrombocytopenia r/t alcoholism HGB stable  P:  Trend  CBC/ platelets DC Heparin SQ  Continue  SCDs OOB to chair   A:   Suicidal ideation boyfriend reports she has been talking about suicide the past few weeks. She denies.   P:   Suicide sitter Per psych does not meet criteria for inpatient psych admission SW consult for bruising/ outpatient psych resources   FAMILY  - Updates: Live-in boyfriend of 10 years updated. She is not in contact with any of her siblings (only relatives) and does not want to be.  - Inter-disciplinary family meet or Palliative Care meeting due by:  6/4   Magdalen Spatz, AGACNP-BC [facility] Pager [phone]     12/07/2017 9:17 AM
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
## 2017-12-08 ENCOUNTER — Inpatient Hospital Stay (HOSPITAL_COMMUNITY): Payer: Self-pay

## 2017-12-08 DIAGNOSIS — R45851 Suicidal ideations: Secondary | ICD-10-CM

## 2017-12-08 LAB — CBC
HEMATOCRIT: 28.1 % — AB (ref 36.0–46.0)
HEMOGLOBIN: 9.6 g/dL — AB (ref 12.0–15.0)
MCH: 32.3 pg (ref 26.0–34.0)
MCHC: 34.2 g/dL (ref 30.0–36.0)
MCV: 94.6 fL (ref 78.0–100.0)
Platelets: 118 10*3/uL — ABNORMAL LOW (ref 150–400)
RBC: 2.97 MIL/uL — ABNORMAL LOW (ref 3.87–5.11)
RDW: 13.7 % (ref 11.5–15.5)
WBC: 7.1 10*3/uL (ref 4.0–10.5)

## 2017-12-08 LAB — COMPREHENSIVE METABOLIC PANEL
ALBUMIN: 3.1 g/dL — AB (ref 3.5–5.0)
ALT: 44 U/L (ref 14–54)
ANION GAP: 8 (ref 5–15)
AST: 53 U/L — ABNORMAL HIGH (ref 15–41)
Alkaline Phosphatase: 68 U/L (ref 38–126)
BILIRUBIN TOTAL: 1.2 mg/dL (ref 0.3–1.2)
BUN: <5 mg/dL — ABNORMAL LOW (ref 6–20)
CO2: 20 mmol/L — AB (ref 22–32)
Calcium: 8 mg/dL — ABNORMAL LOW (ref 8.9–10.3)
Chloride: 106 mmol/L (ref 101–111)
Creatinine, Ser: 0.49 mg/dL (ref 0.44–1.00)
GFR calc Af Amer: >60 mL/min (ref >60)
GFR calc non Af Amer: >60 mL/min (ref >60)
GLUCOSE: 108 mg/dL — AB (ref 65–99)
POTASSIUM: 3.5 mmol/L (ref 3.5–5.1)
SODIUM: 134 mmol/L — AB (ref 135–145)
TOTAL PROTEIN: 5.7 g/dL — AB (ref 6.5–8.1)

## 2017-12-08 LAB — PHOSPHORUS: PHOSPHORUS: 4.6 mg/dL (ref 2.5–4.6)

## 2017-12-08 LAB — GLUCOSE, CAPILLARY
GLUCOSE-CAPILLARY: 121 mg/dL — AB (ref 65–99)
GLUCOSE-CAPILLARY: 91 mg/dL (ref 65–99)
Glucose-Capillary: 104 mg/dL — ABNORMAL HIGH (ref 65–99)
Glucose-Capillary: 98 mg/dL (ref 65–99)
Glucose-Capillary: 125 mg/dL — ABNORMAL HIGH (ref 65–99)
Glucose-Capillary: 53 mg/dL — ABNORMAL LOW (ref 65–99)
Glucose-Capillary: 140 mg/dL — ABNORMAL HIGH (ref 65–99)

## 2017-12-08 LAB — MAGNESIUM: Magnesium: 1.4 mg/dL — ABNORMAL LOW (ref 1.7–2.4)

## 2017-12-08 MED ORDER — MAGNESIUM SULFATE 50 % IJ SOLN
3.0000 g | Freq: Once | INTRAVENOUS | Status: AC
  Administered 2017-12-08: 3 g via INTRAVENOUS
  Filled 2017-12-08: qty 6

## 2017-12-08 MED ORDER — FLUMAZENIL 0.5 MG/5ML IV SOLN
INTRAVENOUS | Status: AC
  Filled 2017-12-08: qty 5

## 2017-12-08 MED ORDER — DEXTROSE 50 % IV SOLN
12.5000 g | Freq: Once | INTRAVENOUS | Status: AC
  Administered 2017-12-08: 12.5 g via INTRAVENOUS

## 2017-12-08 MED ORDER — DEXTROSE 50 % IV SOLN
INTRAVENOUS | Status: AC
  Administered 2017-12-08: 12.5 g via INTRAVENOUS
  Filled 2017-12-08: qty 50

## 2017-12-08 MED ORDER — MIDAZOLAM HCL 2 MG/2ML IJ SOLN
2.0000 mg | Freq: Once | INTRAMUSCULAR | Status: AC
  Administered 2017-12-08: 2 mg via INTRAVENOUS
  Filled 2017-12-08: qty 2

## 2017-12-08 NOTE — Progress Notes (Signed)
PROGRESS NOTE    Jessica [name]  JJH:[id number] DOB: Sep 06, 1960 DOA: 12/04/2017 PCP: Deland Pretty, MD   Brief Narrative:  18-year-WF PMHx EtOH abuse   HPI obtained from her live-in boyfriend of 10 years.  He reports that at baseline she is not very productive on daily basis.  She does not work and has not for some time.  She is not on disability.  She drinks a large bottle of wine per day, however, she reports only drinking 2 small glasses.  They both agree that she drinks gallons of water per day.  She has been experiencing increasing depression over the past 3 or 4 months.  She has even been suicidal at time of the few weeks prior to presentation.  Boyfriend claims her drinking has picked up over this time.  She has blacked out several times and has suffered many falls.  He feels as though she has suffered at least one concussion.  Starting 5/26 she had developed some nausea and had not been eating or drinking as much.  He reported coming home and she was not sure which was abnormal.  In the evening hours of 5/28 she had a witnessed tonic-clonic type seizure.  EMS was called and she presented to the emergency department.   Upon arrival to the ED she continued with significant tremors. Code stroke was called but was cancelled once neurology saw the patient. They felt this represented alcohol withdrawal which probably caused the seizure. Notably her sodium was 118 as well. PCCM asked to admit due to profound hyponatremia and need for close monitoring.       Subjective: 6/1 A/O x2 (does not know where, why).  Follows commands.  Negative CP, negative S OB, negative abdominal pain.   Assessment & Plan:   Principal Problem:   Substance induced mood disorder (HCC) Active Problems:   Delirium tremens ([place])   EtOH seizure  -Likely secondary to hyponatremia, alcohol withdrawal.  EtOH abuse with withdrawal/delirium tremens -Titrate Precedex off as tolerated -CIWA protocol -Patient also on  scheduled Ativan will need to slowly titrate off. - Ativan as needed  Altered mental status - Versed 2 mg x 1 for MRI brain -RN Melody will accompany patient to study  Hyponatremia - Beer portal mania?  Per HPI consumes wine and beer.  NOTE in addition per HPI consumes gallons of water per day -Resolved  Hypokalemia -Resolved  Hypomagnesemia -Magnesium IV 3 g  Hypophosphatemia -See hypokalemia  Thrombocytopenia -Most likely secondary to EtOH abuse -Improving -Heparin DC'd   Suicidal ideation - Per epic boyfriend reports patient has been talking about suicide over the past few weeks.  Currently patient not competent to have discussion concerning suicidal/homicidal ideation - Suicide sitter present - When patient's mentation improves will require psychiatric evaluation     DVT prophylaxis: SCD Code Status: Full Family Communication: None Disposition Plan: TBD   Consultants:  Psychiatry PCCM    Procedures/Significant Events:  5/28 CT head/maxillofacial  > Evaluation is severely limited due to motion degradation. Nondiagnostic evaluation of the mandible. Within that constraint, no gross maxillofacial fracture is seen. Moderate motion degraded examination without acute intracranial process. Scattered subcentimeter calvarial lucencies could reflect hemangiomas though if there is a history of cancer, metastatic disease is possible.     I have personally reviewed and interpreted all radiology studies and my findings are as above.  VENTILATOR SETTINGS:    Cultures   Antimicrobials: Anti-infectives (From admission, onward)   None  Devices    LINES / TUBES:      Continuous Infusions: . sodium chloride    . dexmedetomidine 1 mcg/kg/hr (12/08/17 0815)     Objective: Vitals:   12/08/17 0413 12/08/17 0500 12/08/17 0600 12/08/17 0841  BP:  (!) 144/123 (!) 148/95   Pulse:  (!) 131 99   Resp:  (!) 21 18   Temp:  97.8 F (36.6 C)  98.8 F (37.1  C)  TempSrc:    Oral  SpO2:  96% 97%   Weight: 138 lb 3.7 oz (62.7 kg)     Height:        Intake/Output Summary (Last 24 hours) at 12/08/2017 0901 Last data filed at 12/08/2017 0600 Gross per 24 hour  Intake 963.48 ml  Output 3000 ml  Net -2036.52 ml   Filed Weights   12/06/17 0449 12/07/17 0455 12/08/17 0413  Weight: 134 lb 14.7 oz (61.2 kg) 137 lb 12.6 oz (62.5 kg) 138 lb 3.7 oz (62.7 kg)    Examination:  General: Somnolent but arousable.  A/O x2 (does not know where, why No acute respiratory distress, very impulsive but redirectable ENT: Negative Runny nose, negative gingival bleeding, Neck:  Negative scars, masses, torticollis, lymphadenopathy, JVD Lungs: Clear to auscultation bilaterally without wheezes or crackles Cardiovascular: Regular rate and rhythm without murmur gallop or rub normal S1 and S2 Abdomen: negative abdominal pain, nondistended, positive soft, bowel sounds, no rebound, no ascites, no appreciable mass Extremities: No significant cyanosis, clubbing, or edema bilateral lower extremities Skin: Negative rashes, lesions, ulcers Psychiatric: Unable to assess secondary to altered mental status  Central nervous system:  Cranial nerves II through XII intact, tongue/uvula midline, all extremities muscle strength 5/5, sensation intact throughout, negative dysarthria, negative expressive aphasia, negative receptive aphasia.  .     Data Reviewed: Care during the described time interval was provided by me .  I have reviewed this patient's available data, including medical history, events of note, physical examination, and all test results as part of my evaluation.   CBC: Recent Labs  Lab 12/04/17 2133 12/05/17 0658 12/06/17 0354 12/07/17 0400 12/08/17 0104  WBC 9.3 8.4 6.4 6.6 7.1  NEUTROABS 6.4  --   --   --   --   HGB 13.3 10.3* 10.0* 10.1* 9.6*  HCT 37.8 29.0* 28.7* 29.6* 28.1*  MCV 91.1 91.5 92.9 94.9 94.6  PLT 119* 63* 62* 78* 672*   Basic Metabolic  Panel: Recent Labs  Lab 12/05/17 0056  12/05/17 0658  12/05/17 2248 12/06/17 0354 12/06/17 1539 12/07/17 0400 12/08/17 0104  NA 122*   < > 127*   < > 128* 133*  133* 135 131* 134*  K 3.6   < > 4.1   < > 3.1* 3.4*  3.4* 3.3* 3.1* 3.5  CL 89*   < > 95*   < > 101 104  104 105 105 106  CO2 20*   < > 23   < > 18* 20*  21* 20* 18* 20*  GLUCOSE 126*   < > 87   < > 112* 141*  141* 131* 131* 108*  BUN 8   < > 10   < > 6 5*  6 6 6  <5*  CREATININE 0.70   < > 0.69   < > 0.53 0.51  0.52 0.54 0.57 0.49  CALCIUM 8.1*   < > 7.9*   < > 7.9* 8.3*  8.2* 8.5* 8.2* 8.0*  MG  --   --  2.0  --   --   --   --  1.3* 1.4*  PHOS 2.7  --  2.9  --   --   --   --  1.9* 4.6   < > = values in this interval not displayed.   GFR: Estimated Creatinine Clearance: 77.7 mL/min (by C-G formula based on SCr of 0.49 mg/dL). Liver Function Tests: Recent Labs  Lab 12/04/17 2133 12/06/17 0354 12/08/17 0104  AST 129* 60* 53*  ALT 83* 51 44  ALKPHOS 66 56 68  BILITOT 1.9* 1.5* 1.2  PROT 7.4 5.8* 5.7*  ALBUMIN 4.2 3.1* 3.1*   Recent Labs  Lab 12/04/17 2133  LIPASE 36   Recent Labs  Lab 12/04/17 2125  AMMONIA 80*   Coagulation Profile: Recent Labs  Lab 12/04/17 2133  INR 1.00   Cardiac Enzymes: No results for input(s): CKTOTAL, CKMB, CKMBINDEX, TROPONINI in the last 168 hours. BNP (last 3 results) No results for input(s): PROBNP in the last 8760 hours. HbA1C: No results for input(s): HGBA1C in the last 72 hours. CBG: Recent Labs  Lab 12/07/17 1532 12/07/17 2037 12/08/17 0002 12/08/17 0456 12/08/17 0840  GLUCAP 148* 119* 104* 121* 98   Lipid Profile: No results for input(s): CHOL, HDL, LDLCALC, TRIG, CHOLHDL, LDLDIRECT in the last 72 hours. Thyroid Function Tests: No results for input(s): TSH, T4TOTAL, FREET4, T3FREE, THYROIDAB in the last 72 hours. Anemia Panel: No results for input(s): VITAMINB12, FOLATE, FERRITIN, TIBC, IRON, RETICCTPCT in the last 72 hours. Urine analysis:      Component Value Date/Time   COLORURINE YELLOW 12/05/2017 0226   APPEARANCEUR CLEAR 12/05/2017 0226   LABSPEC 1.006 12/05/2017 0226   PHURINE 6.0 12/05/2017 0226   GLUCOSEU NEGATIVE 12/05/2017 0226   HGBUR NEGATIVE 12/05/2017 0226   BILIRUBINUR NEGATIVE 12/05/2017 0226   KETONESUR 5 (A) 12/05/2017 0226   PROTEINUR NEGATIVE 12/05/2017 0226   NITRITE NEGATIVE 12/05/2017 0226   LEUKOCYTESUR NEGATIVE 12/05/2017 0226   Sepsis Labs: @LABRCNTIP (procalcitonin:4,lacticidven:4)  ) Recent Results (from the past 240 hour(s))  MRSA PCR Screening     Status: None   Collection Time: 12/05/17 12:29 AM  Result Value Ref Range Status   MRSA by PCR NEGATIVE NEGATIVE Final    Comment:        The GeneXpert MRSA Assay (FDA approved for NASAL specimens only), is one component of a comprehensive MRSA colonization surveillance program. It is not intended to diagnose MRSA infection nor to guide or monitor treatment for MRSA infections. Performed at [facility], [place] [address] [place], [place] [address]          Radiology Studies: No results found.      Scheduled Meds: . folic acid  1 mg Oral Daily  . insulin aspart  1-3 Units Subcutaneous Q4H  . LORazepam  1 mg Oral TID  . mirtazapine  15 mg Oral QHS  . nicotine  21 mg Transdermal Daily  . potassium chloride  40 mEq Oral Once  . thiamine  100 mg Oral Daily   Continuous Infusions: . sodium chloride    . dexmedetomidine 1 mcg/kg/hr (12/08/17 0815)     LOS: 4 days    Time spent: 40 minutes    Milderd Manocchio, Geraldo Docker, MD [facility] Pager [phone]   If 7PM-7AM, please contact night-coverage [email or web address] Password [email or web address] 12/08/2017, 9:01 AM

## 2017-12-09 LAB — CBC
HCT: 29.9 % — ABNORMAL LOW (ref 36.0–46.0)
HEMOGLOBIN: 10.1 g/dL — AB (ref 12.0–15.0)
MCH: 32.5 pg (ref 26.0–34.0)
MCHC: 33.8 g/dL (ref 30.0–36.0)
MCV: 96.1 fL (ref 78.0–100.0)
PLATELETS: 204 10*3/uL (ref 150–400)
RBC: 3.11 MIL/uL — AB (ref 3.87–5.11)
RDW: 13.7 % (ref 11.5–15.5)
WBC: 5.3 10*3/uL (ref 4.0–10.5)

## 2017-12-09 LAB — GLUCOSE, CAPILLARY
GLUCOSE-CAPILLARY: 105 mg/dL — AB (ref 65–99)
GLUCOSE-CAPILLARY: 119 mg/dL — AB (ref 65–99)
GLUCOSE-CAPILLARY: 128 mg/dL — AB (ref 65–99)
Glucose-Capillary: 109 mg/dL — ABNORMAL HIGH (ref 65–99)
Glucose-Capillary: 162 mg/dL — ABNORMAL HIGH (ref 65–99)
Glucose-Capillary: 91 mg/dL (ref 65–99)

## 2017-12-09 LAB — BASIC METABOLIC PANEL
ANION GAP: 10 (ref 5–15)
BUN: <5 mg/dL — ABNORMAL LOW (ref 6–20)
CALCIUM: 8.3 mg/dL — AB (ref 8.9–10.3)
CO2: 23 mmol/L (ref 22–32)
Chloride: 105 mmol/L (ref 101–111)
Creatinine, Ser: 0.45 mg/dL (ref 0.44–1.00)
Glucose, Bld: 105 mg/dL — ABNORMAL HIGH (ref 65–99)
POTASSIUM: 3.2 mmol/L — AB (ref 3.5–5.1)
SODIUM: 138 mmol/L (ref 135–145)

## 2017-12-09 LAB — PHOSPHORUS: PHOSPHORUS: 4.5 mg/dL (ref 2.5–4.6)

## 2017-12-09 LAB — MAGNESIUM: MAGNESIUM: 1.6 mg/dL — AB (ref 1.7–2.4)

## 2017-12-09 MED ORDER — MAGNESIUM SULFATE 50 % IJ SOLN
3.0000 g | Freq: Once | INTRAVENOUS | Status: AC
  Administered 2017-12-09: 3 g via INTRAVENOUS
  Filled 2017-12-09 (×2): qty 6

## 2017-12-09 MED ORDER — LORAZEPAM 0.5 MG PO TABS
0.5000 mg | ORAL_TABLET | Freq: Three times a day (TID) | ORAL | Status: DC
  Administered 2017-12-09 – 2017-12-12 (×9): 0.5 mg via ORAL
  Filled 2017-12-09 (×9): qty 1

## 2017-12-09 MED ORDER — INSULIN ASPART 100 UNIT/ML ~~LOC~~ SOLN: 1.0000 [IU] | Freq: Three times a day (TID) | SUBCUTANEOUS | Status: DC

## 2017-12-09 MED ORDER — POTASSIUM CHLORIDE CRYS ER 20 MEQ PO TBCR
20.0000 meq | EXTENDED_RELEASE_TABLET | Freq: Once | ORAL | Status: AC
Start: 2017-12-09 — End: 2017-12-09
  Administered 2017-12-09: 20 meq via ORAL
  Filled 2017-12-09: qty 1

## 2017-12-09 MED ORDER — POTASSIUM CHLORIDE 10 MEQ/100ML IV SOLN
10.0000 meq | INTRAVENOUS | Status: AC
  Administered 2017-12-09 (×4): 10 meq via INTRAVENOUS
  Filled 2017-12-09 (×4): qty 100

## 2017-12-09 MED ORDER — MAGNESIUM SULFATE 2 GM/50ML IV SOLN
2.0000 g | Freq: Once | INTRAVENOUS | Status: AC
  Administered 2017-12-09: 2 g via INTRAVENOUS
  Filled 2017-12-09: qty 50

## 2017-12-09 NOTE — Progress Notes (Signed)
[facility] PCCM  S: feels OK, less tremulous  O: Vitals:   12/09/17 0400 12/09/17 0500 12/09/17 0600 12/09/17 0700  BP: (!) 151/87 (!) 153/88 (!) 143/86   Pulse: 65 66 66 67  Resp: 13 12 13 12   Temp:    97.9 F (36.6 C)  TempSrc:    Oral  SpO2: 99% 98% 98% 98%  Weight:  138 lb 3.7 oz (62.7 kg)    Height:       RA  General:  Resting comfortably in bed HENT: NCAT OP clear PULM: CTA B, normal effort CV: RRR, no mgr GI: BS+, soft, nontender MSK: normal bulk and tone Neuro: awake, alert, no distress, MAEW, no tremor for me   BMET    Component Value Date/Time   NA 138 12/09/2017 0346   K 3.2 (L) 12/09/2017 0346   CL 105 12/09/2017 0346   CO2 23 12/09/2017 0346   GLUCOSE 105 (H) 12/09/2017 0346   BUN <5 (L) 12/09/2017 0346   CREATININE 0.45 12/09/2017 0346   CALCIUM 8.3 (L) 12/09/2017 0346   GFRNONAA >60 12/09/2017 0346   GFRAA >60 12/09/2017 0346   Impression: Alcohol withdrawal Hyponatremia  Discussion Mental status improved, not tremulous for me today.  Plan: Stop precedex Continue prn Ativan per CIWA protocol  Rest per [facility]  Roselie Awkward, MD [place] PCCM Pager: [phone] Cell: [phone] After 3pm or if no response, call [phone]

## 2017-12-09 NOTE — Progress Notes (Signed)
PROGRESS NOTE    Jessica [name]  SNK:[id number] DOB: 1961/03/20 DOA: 12/04/2017 PCP: Deland Pretty, MD   Brief Narrative:  39-year-WF PMHx EtOH abuse   HPI obtained from her live-in boyfriend of 10 years.  He reports that at baseline she is not very productive on daily basis.  She does not work and has not for some time.  She is not on disability.  She drinks a large bottle of wine per day, however, she reports only drinking 2 small glasses.  They both agree that she drinks gallons of water per day.  She has been experiencing increasing depression over the past 3 or 4 months.  She has even been suicidal at time of the few weeks prior to presentation.  Boyfriend claims her drinking has picked up over this time.  She has blacked out several times and has suffered many falls.  He feels as though she has suffered at least one concussion.  Starting 5/26 she had developed some nausea and had not been eating or drinking as much.  He reported coming home and she was not sure which was abnormal.  In the evening hours of 5/28 she had a witnessed tonic-clonic type seizure.  EMS was called and she presented to the emergency department.   Upon arrival to the ED she continued with significant tremors. Code stroke was called but was cancelled once neurology saw the patient. They felt this represented alcohol withdrawal which probably caused the seizure. Notably her sodium was 118 as well. PCCM asked to admit due to profound hyponatremia and need for close monitoring.       Subjective: 6/2 A/O x4, negative CP, negative S OB, positive asterixis, negative abdominal pain    6/1 A/O x2 (does not know where, why).  Follows commands.  Negative CP, negative S OB, negative abdominal pain.   Assessment & Plan:   Principal Problem:   Substance induced mood disorder (HCC) Active Problems:   Delirium tremens ([place])   EtOH seizure  -Likely secondary to hyponatremia, alcohol withdrawal.  EtOH abuse with  withdrawal/delirium tremens -Precedex off -Continue CIWA protocol  -Ativan 0.5 mg TID -Ativan  PRN  Altered mental status - Resolving patient still impulsive but redirectable.  Hyponatremia - Beer portal mania?  Per HPI consumes wine and beer.  NOTE in addition per HPI consumes gallons of water per day -Resolved  Hypokalemia -Potassium 60 mEq  Hypomagnesemia -Magnesium IV 3 g  Hypophosphatemia -See hypokalemia  Thrombocytopenia -Most likely secondary to EtOH abuse -Heparin DC'd  -Resolved  Suicidal ideation - Per epic boyfriend reports patient has been talking about suicide over the past few weeks.  Currently patient not competent to have discussion concerning suicidal/homicidal ideation - Suicide sitter present - 6/2 psychiatry consult placed      DVT prophylaxis: SCD Code Status: Full Family Communication: None Disposition Plan: TBD   Consultants:  Psychiatry PCCM    Procedures/Significant Events:  5/28 CT head/maxillofacial  > Evaluation is severely limited due to motion degradation. Nondiagnostic evaluation of the mandible. Within that constraint, no gross maxillofacial fracture is seen. Moderate motion degraded examination without acute intracranial process. Scattered subcentimeter calvarial lucencies could reflect hemangiomas though if there is a history of cancer, metastatic disease is possible.     I have personally reviewed and interpreted all radiology studies and my findings are as above.  VENTILATOR SETTINGS:    Cultures   Antimicrobials: Anti-infectives (From admission, onward)   None       Devices  LINES / TUBES:      Continuous Infusions: . sodium chloride    . dexmedetomidine 1.2 mcg/kg/hr (12/09/17 0847)  . potassium chloride Stopped (12/09/17 0744)     Objective: Vitals:   12/09/17 0400 12/09/17 0500 12/09/17 0600 12/09/17 0700  BP: (!) 151/87 (!) 153/88 (!) 143/86   Pulse: 65 66 66 67  Resp: 13 12 13 12     Temp:    97.9 F (36.6 C)  TempSrc:    Oral  SpO2: 99% 98% 98% 98%  Weight:  138 lb 3.7 oz (62.7 kg)    Height:        Intake/Output Summary (Last 24 hours) at 12/09/2017 0934 Last data filed at 12/09/2017 0700 Gross per 24 hour  Intake 858.5 ml  Output 2165 ml  Net -1306.5 ml   Filed Weights   12/07/17 0455 12/08/17 0413 12/09/17 0500  Weight: 137 lb 12.6 oz (62.5 kg) 138 lb 3.7 oz (62.7 kg) 138 lb 3.7 oz (62.7 kg)    Physical Exam:  General: A/O x4, No acute respiratory distress Neck:  Negative scars, masses, torticollis, lymphadenopathy, JVD Lungs: Clear to auscultation bilaterally without wheezes or crackles Cardiovascular: Regular rate and rhythm without murmur gallop or rub normal S1 and S2 Abdomen: negative abdominal pain, nondistended, positive soft, bowel sounds, no rebound, no ascites, no appreciable mass Extremities: No significant cyanosis, clubbing, or edema bilateral lower extremities Skin: Negative rashes, lesions, ulcers Psychiatric:  Negative depression, negative anxiety, negative fatigue, negative mania  Central nervous system:  Cranial nerves II through XII intact, tongue/uvula midline, all extremities muscle strength 5/5, sensation intact throughout, positive asterixis, finger nose finger bilateral with bilateral past pointing with tremor, quick finger touch bilateral difficult time accomplishing.  negative dysarthria, negative expressive aphasia, negative receptive aphasia. .     Data Reviewed: Care during the described time interval was provided by me .  I have reviewed this patient's available data, including medical history, events of note, physical examination, and all test results as part of my evaluation.   CBC: Recent Labs  Lab 12/04/17 2133 12/05/17 0658 12/06/17 0354 12/07/17 0400 12/08/17 0104 12/09/17 0346  WBC 9.3 8.4 6.4 6.6 7.1 5.3  NEUTROABS 6.4  --   --   --   --   --   HGB 13.3 10.3* 10.0* 10.1* 9.6* 10.1*  HCT 37.8 29.0* 28.7*  29.6* 28.1* 29.9*  MCV 91.1 91.5 92.9 94.9 94.6 96.1  PLT 119* 63* 62* 78* 118* 737   Basic Metabolic Panel: Recent Labs  Lab 12/05/17 0056  12/05/17 0658  12/06/17 0354 12/06/17 1539 12/07/17 0400 12/08/17 0104 12/09/17 0346  NA 122*   < > 127*   < > 133*  133* 135 131* 134* 138  K 3.6   < > 4.1   < > 3.4*  3.4* 3.3* 3.1* 3.5 3.2*  CL 89*   < > 95*   < > 104  104 105 105 106 105  CO2 20*   < > 23   < > 20*  21* 20* 18* 20* 23  GLUCOSE 126*   < > 87   < > 141*  141* 131* 131* 108* 105*  BUN 8   < > 10   < > 5*  6 6 6  <5* <5*  CREATININE 0.70   < > 0.69   < > 0.51  0.52 0.54 0.57 0.49 0.45  CALCIUM 8.1*   < > 7.9*   < > 8.3*  8.2* 8.5*  8.2* 8.0* 8.3*  MG  --   --  2.0  --   --   --  1.3* 1.4* 1.6*  PHOS 2.7  --  2.9  --   --   --  1.9* 4.6 4.5   < > = values in this interval not displayed.   GFR: Estimated Creatinine Clearance: 77.7 mL/min (by C-G formula based on SCr of 0.45 mg/dL). Liver Function Tests: Recent Labs  Lab 12/04/17 2133 12/06/17 0354 12/08/17 0104  AST 129* 60* 53*  ALT 83* 51 44  ALKPHOS 66 56 68  BILITOT 1.9* 1.5* 1.2  PROT 7.4 5.8* 5.7*  ALBUMIN 4.2 3.1* 3.1*   Recent Labs  Lab 12/04/17 2133  LIPASE 36   Recent Labs  Lab 12/04/17 2125  AMMONIA 80*   Coagulation Profile: Recent Labs  Lab 12/04/17 2133  INR 1.00   Cardiac Enzymes: No results for input(s): CKTOTAL, CKMB, CKMBINDEX, TROPONINI in the last 168 hours. BNP (last 3 results) No results for input(s): PROBNP in the last 8760 hours. HbA1C: No results for input(s): HGBA1C in the last 72 hours. CBG: Recent Labs  Lab 12/08/17 1640 12/08/17 1954 12/09/17 0011 12/09/17 0356 12/09/17 0720  GLUCAP 140* 91 128* 105* 109*   Lipid Profile: No results for input(s): CHOL, HDL, LDLCALC, TRIG, CHOLHDL, LDLDIRECT in the last 72 hours. Thyroid Function Tests: No results for input(s): TSH, T4TOTAL, FREET4, T3FREE, THYROIDAB in the last 72 hours. Anemia Panel: No results for  input(s): VITAMINB12, FOLATE, FERRITIN, TIBC, IRON, RETICCTPCT in the last 72 hours. Urine analysis:    Component Value Date/Time   COLORURINE YELLOW 12/05/2017 0226   APPEARANCEUR CLEAR 12/05/2017 0226   LABSPEC 1.006 12/05/2017 0226   PHURINE 6.0 12/05/2017 0226   GLUCOSEU NEGATIVE 12/05/2017 0226   HGBUR NEGATIVE 12/05/2017 0226   BILIRUBINUR NEGATIVE 12/05/2017 0226   KETONESUR 5 (A) 12/05/2017 0226   PROTEINUR NEGATIVE 12/05/2017 0226   NITRITE NEGATIVE 12/05/2017 0226   LEUKOCYTESUR NEGATIVE 12/05/2017 0226   Sepsis Labs: @LABRCNTIP (procalcitonin:4,lacticidven:4)  ) Recent Results (from the past 240 hour(s))  MRSA PCR Screening     Status: None   Collection Time: 12/05/17 12:29 AM  Result Value Ref Range Status   MRSA by PCR NEGATIVE NEGATIVE Final    Comment:        The GeneXpert MRSA Assay (FDA approved for NASAL specimens only), is one component of a comprehensive MRSA colonization surveillance program. It is not intended to diagnose MRSA infection nor to guide or monitor treatment for MRSA infections. Performed at [facility], [place] [address], [place], [place] [address]          Radiology Studies: Mr Brain Wo Contrast  Result Date: 12/08/2017 CLINICAL DATA:  Seizure.  Alcohol withdrawal. EXAM: MRI HEAD WITHOUT CONTRAST TECHNIQUE: Multiplanar, multiecho pulse sequences of the brain and surrounding structures were obtained without intravenous contrast. COMPARISON:  CT HEAD Dec 04, 2017 FINDINGS: Mild motion degraded examination. INTRACRANIAL CONTENTS: No reduced diffusion to suggest acute ischemia or status epilepticus. No susceptibility artifact to suggest hemorrhage. Mild parenchymal brain volume loss for age, cavum septum pellucidum. No hydrocephalus. No suspicious parenchymal signal, masses, mass effect. Patchy supratentorial white matter FLAIR T2-hyperintensities. Trace T1 shortening and susceptibility artifact along the mid falx. Faint frontal  convexity extra-axial hemosiderin staining. At least 4 RIGHT cerebrum chronic micro hemorrhages. Normal symmetric size, morphology and signal of the hippocampi. VASCULAR: Normal major intracranial vascular flow voids present at skull base. SKULL AND UPPER CERVICAL SPINE:  No abnormal sellar expansion. No suspicious calvarial bone marrow signal. Grade 1 C4-5 anterolisthesis. Craniocervical junction maintained. SINUSES/ORBITS: The mastoid air-cells and included paranasal sinuses are well-aerated.The included ocular globes and orbital contents are non-suspicious. OTHER: None. IMPRESSION: 1. No acute intracranial process on this mildly motion degraded examination. 2. Trace subacute parafalcine subdural hematoma. Minimal old subarachnoid hemorrhage. 3. Mild parenchymal brain volume loss for age. 4. Mild-to-moderate chronic small vessel ischemic changes. Electronically Signed   By: Elon Alas M.D.   On: 12/08/2017 22:32        Scheduled Meds: . folic acid  1 mg Oral Daily  . insulin aspart  1-3 Units Subcutaneous Q4H  . LORazepam  1 mg Oral TID  . mirtazapine  15 mg Oral QHS  . nicotine  21 mg Transdermal Daily  . potassium chloride  40 mEq Oral Once  . thiamine  100 mg Oral Daily   Continuous Infusions: . sodium chloride    . dexmedetomidine 1.2 mcg/kg/hr (12/09/17 0847)  . potassium chloride Stopped (12/09/17 0744)     LOS: 5 days    Time spent: 40 minutes    WOODS, Geraldo Docker, MD [facility] Pager [phone]   If 7PM-7AM, please contact night-coverage [email or web address] Password [facility] 12/09/2017, 9:34 AM

## 2017-12-09 NOTE — Progress Notes (Signed)
Precedex gtt discontinued.  Patient meeting criteria to discontinue all restraints tools.

## 2017-12-10 ENCOUNTER — Other Ambulatory Visit: Payer: Self-pay

## 2017-12-10 LAB — BASIC METABOLIC PANEL
ANION GAP: 7 (ref 5–15)
BUN: <5 mg/dL — ABNORMAL LOW (ref 6–20)
CHLORIDE: 105 mmol/L (ref 101–111)
CO2: 23 mmol/L (ref 22–32)
Calcium: 8 mg/dL — ABNORMAL LOW (ref 8.9–10.3)
Creatinine, Ser: 0.56 mg/dL (ref 0.44–1.00)
GFR calc non Af Amer: >60 mL/min (ref >60)
Glucose, Bld: 95 mg/dL (ref 65–99)
Potassium: 3.6 mmol/L (ref 3.5–5.1)
Sodium: 135 mmol/L (ref 135–145)

## 2017-12-10 LAB — CBC
HEMATOCRIT: 29.8 % — AB (ref 36.0–46.0)
Hemoglobin: 10.1 g/dL — ABNORMAL LOW (ref 12.0–15.0)
MCH: 32.7 pg (ref 26.0–34.0)
MCHC: 33.9 g/dL (ref 30.0–36.0)
MCV: 96.4 fL (ref 78.0–100.0)
Platelets: 313 10*3/uL (ref 150–400)
RBC: 3.09 MIL/uL — AB (ref 3.87–5.11)
RDW: 13.9 % (ref 11.5–15.5)
WBC: 6.6 10*3/uL (ref 4.0–10.5)

## 2017-12-10 LAB — GLUCOSE, CAPILLARY: Glucose-Capillary: 96 mg/dL (ref 65–99)

## 2017-12-10 LAB — MAGNESIUM: MAGNESIUM: 1.5 mg/dL — AB (ref 1.7–2.4)

## 2017-12-10 MED ORDER — MAGNESIUM SULFATE 2 GM/50ML IV SOLN
2.0000 g | Freq: Once | INTRAVENOUS | Status: AC
  Administered 2017-12-10: 2 g via INTRAVENOUS
  Filled 2017-12-10: qty 50

## 2017-12-10 MED ORDER — CLONIDINE HCL 0.1 MG PO TABS
0.1000 mg | ORAL_TABLET | Freq: Three times a day (TID) | ORAL | Status: DC
  Administered 2017-12-10 – 2017-12-12 (×8): 0.1 mg via ORAL
  Filled 2017-12-10 (×8): qty 1

## 2017-12-10 MED ORDER — BUTALBITAL-APAP-CAFFEINE 50-325-40 MG PO TABS: 1.0000 | ORAL_TABLET | Freq: Four times a day (QID) | ORAL | Status: DC | PRN

## 2017-12-10 MED ORDER — POTASSIUM CHLORIDE CRYS ER 20 MEQ PO TBCR
40.0000 meq | EXTENDED_RELEASE_TABLET | Freq: Once | ORAL | Status: AC
  Administered 2017-12-10: 40 meq via ORAL
  Filled 2017-12-10: qty 2

## 2017-12-10 MED ORDER — ACETAMINOPHEN 500 MG PO TABS
500.0000 mg | ORAL_TABLET | Freq: Four times a day (QID) | ORAL | Status: DC | PRN
  Administered 2017-12-10: 500 mg via ORAL
  Filled 2017-12-10: qty 1

## 2017-12-10 NOTE — Plan of Care (Signed)
  Problem: Education: Goal: Knowledge of General Education information will improve Outcome: Progressing   Problem: Health Behavior/Discharge Planning: Goal: Ability to manage health-related needs will improve Outcome: Progressing   Problem: Clinical Measurements: Goal: Ability to maintain clinical measurements within normal limits will improve Outcome: Progressing Goal: Will remain free from infection Outcome: Progressing Goal: Diagnostic test results will improve Outcome: Progressing Goal: Respiratory complications will improve Outcome: Progressing Goal: Cardiovascular complication will be avoided Outcome: Progressing   Problem: Activity: Goal: Risk for activity intolerance will decrease Outcome: Progressing   Problem: Nutrition: Goal: Adequate nutrition will be maintained Outcome: Progressing   Problem: Coping: Goal: Level of anxiety will decrease Outcome: Progressing   Problem: Elimination: Goal: Will not experience complications related to bowel motility Outcome: Progressing Goal: Will not experience complications related to urinary retention Outcome: Progressing   Problem: Safety: Goal: Ability to remain free from injury will improve Outcome: Progressing   Problem: Skin Integrity: Goal: Risk for impaired skin integrity will decrease Outcome: Progressing   

## 2017-12-10 NOTE — Progress Notes (Signed)
[place] TEAM 1 - Stepdown/ICU TEAM  JAMELA CUMBO  ZHY:[id number] DOB: 1960/10/31 DOA: 12/04/2017 PCP: Deland Pretty, MD    Brief Narrative:  57yo F w/ a Hx of EtOH abuse per her live-in boyfriend of 10 years who does not work and is not on disability. She reportedly drinks a large bottle of wine daily, though she does not admit to this.  5/26 she developed some nausea and had not been eating or drinking much. In the evening hours of 5/28 she had a witnessed tonic-clonic type seizure.   Upon arrival to the ED she continued with significant tremors.  It was felt this represented alcohol withdrawal which probably caused the seizure. Notably her sodium was 118.   Significant Events: 5/28 admit - seizures - hyponatremia 6/3 precedex stopped   Subjective: Pt is awake and calm.  She c/o diffuse HA.  She denies cp or sob.  She is hungry but is not a fan of the hospital food.    Assessment & Plan:  EtOH withdrawal seizure- Altered mental status secondary to hyponatremia + alcohol withdrawal - has required Precedex - continue CIWA but appears this is waining   Hyponatremia Beer potomania? - corrected - monitor   Hypokalemia Cont to supplement to goal of 4.0  Hypomagnesemia Replace and follow trend   Hypophosphatemia Corrected  Thrombocytopenia secondary to EtOH abuse - resolved in absence of EtOH  Suicidal ideation boyfriend reported patient has been talking about suicide over the past few weeks - Psychiatry evaluated the pt on 5/30 and reported no evidence of imminent risk to self   DVT prophylaxis: SCDs Code Status: FULL CODE Family Communication: no family present at time of exam  Disposition Plan: transfer to SDU - follow   Consultants:  PCCM Psychiatry  Antimicrobials:  none  Objective: Blood pressure (!) 163/95, pulse (!) 113, temperature (!) 100.4 F (38 C), temperature source Oral, resp. rate 19, height 5\' 11"  (1.803 m), weight 60.8 kg (134 lb 0.6 oz),  SpO2 96 %.  Intake/Output Summary (Last 24 hours) at 12/10/2017 0918 Last data filed at 12/10/2017 0600 Gross per 24 hour  Intake -  Output 2400 ml  Net -2400 ml   Filed Weights   12/08/17 0413 12/09/17 0500 12/10/17 0500  Weight: 62.7 kg (138 lb 3.7 oz) 62.7 kg (138 lb 3.7 oz) 60.8 kg (134 lb 0.6 oz)    Examination: General: No acute respiratory distress Lungs: Clear to auscultation bilaterally without wheezes or crackles Cardiovascular: tachycardic but regular - no M or rub  Abdomen: Nontender, nondistended, soft, bowel sounds positive, no rebound, no ascites, no appreciable mass Extremities: No significant cyanosis, clubbing, or edema bilateral lower extremities  CBC: Recent Labs  Lab 12/04/17 2133  12/08/17 0104 12/09/17 0346 12/10/17 0433  WBC 9.3   < > 7.1 5.3 6.6  NEUTROABS 6.4  --   --   --   --   HGB 13.3   < > 9.6* 10.1* 10.1*  HCT 37.8   < > 28.1* 29.9* 29.8*  MCV 91.1   < > 94.6 96.1 96.4  PLT 119*   < > 118* 204 313   < > = values in this interval not displayed.   Basic Metabolic Panel: Recent Labs  Lab 12/07/17 0400 12/08/17 0104 12/09/17 0346 12/10/17 0433  NA 131* 134* 138 135  K 3.1* 3.5 3.2* 3.6  CL 105 106 105 105  CO2 18* 20* 23 23  GLUCOSE 131* 108* 105* 95  BUN  6 <5* <5* <5*  CREATININE 0.57 0.49 0.45 0.56  CALCIUM 8.2* 8.0* 8.3* 8.0*  MG 1.3* 1.4* 1.6* 1.5*  PHOS 1.9* 4.6 4.5  --    GFR: Estimated Creatinine Clearance: 75.4 mL/min (by C-G formula based on SCr of 0.56 mg/dL).  Liver Function Tests: Recent Labs  Lab 12/04/17 2133 12/06/17 0354 12/08/17 0104  AST 129* 60* 53*  ALT 83* 51 44  ALKPHOS 66 56 68  BILITOT 1.9* 1.5* 1.2  PROT 7.4 5.8* 5.7*  ALBUMIN 4.2 3.1* 3.1*   Recent Labs  Lab 12/04/17 2133  LIPASE 36   Recent Labs  Lab 12/04/17 2125  AMMONIA 80*    Coagulation Profile: Recent Labs  Lab 12/04/17 2133  INR 1.00    CBG: Recent Labs  Lab 12/09/17 0720 12/09/17 1141 12/09/17 1552 12/09/17 2151  12/10/17 0803  GLUCAP 109* 162* 91 119* 96    Recent Results (from the past 240 hour(s))  MRSA PCR Screening     Status: None   Collection Time: 12/05/17 12:29 AM  Result Value Ref Range Status   MRSA by PCR NEGATIVE NEGATIVE Final    Comment:        The GeneXpert MRSA Assay (FDA approved for NASAL specimens only), is one component of a comprehensive MRSA colonization surveillance program. It is not intended to diagnose MRSA infection nor to guide or monitor treatment for MRSA infections. Performed at [facility], [place] [address] [place], [place] [address]      Scheduled Meds: . folic acid  1 mg Oral Daily  . insulin aspart  1-3 Units Subcutaneous TID WC  . LORazepam  0.5 mg Oral TID  . mirtazapine  15 mg Oral QHS  . nicotine  21 mg Transdermal Daily  . thiamine  100 mg Oral Daily     LOS: 6 days   Cherene Altes, MD [facility] Office  [phone] Pager - Text Page per Amion as per below:  On-Call/Text Page:      [email or web address]      password [email or web address]  If 7PM-7AM, please contact night-coverage [email or web address] Password [email or web address] 12/10/2017, 9:18 AM

## 2017-12-10 NOTE — Progress Notes (Signed)
Physical Therapy Treatment Patient Details Name: Jessica [name] MRN: [id number] DOB: April 29, 1961 Today's Date: 12/10/2017    History of Present Illness 57 year old female admitted to ICU with EtOH withdrawal and witnessed seizure 5/28 with hyponatremia, sodium 116.    PT Comments    Pt initially not agreeable to PT as she had not slept last night, but with max verbal encouragement agreed to a short walk. Pt min guard with bed mobility, required minA for steadying with sit>stand. Pt requires B UE support on IV pole and ambulates at a 1.5 ft/sec which is indicative of limited community ambulation and requires minA for steadying. PT recommendations for discharge remain appropriate given pt's decreased balance and  endurance and decreased ambulation velocity. PT will continue to follow.     Follow Up Recommendations  SNF;Supervision/Assistance - 24 hour     Equipment Recommendations  Other (comment)(TBA)    Recommendations for Other Services       Precautions / Restrictions Precautions Precautions: Fall Restrictions Weight Bearing Restrictions: No    Mobility  Bed Mobility Overal bed mobility: Needs Assistance Bed Mobility: Supine to Sit     Supine to sit: Min guard     General bed mobility comments: min guard for safety, use of bed rail to pull to EoB  Transfers Overall transfer level: Needs assistance Equipment used: None Transfers: Sit to/from Stand Sit to Stand: Min assist;Min guard         General transfer comment: minA for first time to standing for steadying, c/o dizziness and sat back down, dizziness passed quickly and pt stood again with min guard  Ambulation/Gait Ambulation/Gait assistance: Min assist Ambulation Distance (Feet): 300 Feet Assistive device: IV Pole Gait Pattern/deviations: Step-through pattern;Decreased stride length;Drifts right/left Gait velocity: slowed Gait velocity interpretation: 1.31 - 2.62 ft/sec, indicative of limited community  ambulator General Gait Details: minA for steadying at times, pt required B UE support pushing IV pole throughout ambulation, mildly unsteady    Stairs             Wheelchair Mobility    Modified Rankin (Stroke Patients Only)       Balance Overall balance assessment: Needs assistance;History of Falls Sitting-balance support: No upper extremity supported;Feet supported Sitting balance-Leahy Scale: Fair     Standing balance support: Bilateral upper extremity supported;During functional activity Standing balance-Leahy Scale: Poor Standing balance comment: relies on Ue support for static and dynamic balance.                             Cognition Arousal/Alertness: Awake/alert Behavior During Therapy: Anxious(tearful) Overall Cognitive Status: Impaired/Different from baseline Area of Impairment: Following commands;Safety/judgement;Problem solving                       Following Commands: Follows multi-step commands inconsistently Safety/Judgement: Decreased awareness of safety;Decreased awareness of deficits   Problem Solving: Slow processing;Decreased initiation;Difficulty sequencing;Requires verbal cues;Requires tactile cues        Exercises      General Comments General comments (skin integrity, edema, etc.): at rest HR 118, SaO2 on RA 99%O2, BP 117/71, with ambulation HR max 138 bpm, SaO2 on RA dropped to 90% O2, with supine back in bed HR 115, BP 112/92, SaO2 95%      Pertinent Vitals/Pain Pain Assessment: Faces Faces Pain Scale: Hurts even more Pain Location: generalized  Pain Descriptors / Indicators: Grimacing;Sore Pain Intervention(s): Limited activity within patient's tolerance;Monitored during session  Home Living Family/patient expects to be discharged to:: Private residence Living Arrangements: Spouse/significant other;Other (Comment)(boyfriend) Available Help at Discharge: Friend(s);Available PRN/intermittently(boyfriend  works) Type of Home: House Home Access: Stairs to enter Chiropractor: None Home Layout: One level Home Equipment: None      Prior Function Level of Independence: Independent          PT Goals (current goals can now be found in the care plan section) Acute Rehab PT Goals Patient Stated Goal: to get better PT Goal Formulation: With patient Time For Goal Achievement: 12/21/17 Potential to Achieve Goals: Good Progress towards PT goals: Progressing toward goals    Frequency    Min 3X/week      PT Plan Current plan remains appropriate       AM-PAC PT "6 Clicks" Daily Activity  Outcome Measure  Difficulty turning over in bed (including adjusting bedclothes, sheets and blankets)?: None Difficulty moving from lying on back to sitting on the side of the bed? : A Lot Difficulty sitting down on and standing up from a chair with arms (e.g., wheelchair, bedside commode, etc,.)?: Unable Help needed moving to and from a bed to chair (including a wheelchair)?: A Little Help needed walking in hospital room?: A Little Help needed climbing 3-5 steps with a railing? : A Lot 6 Click Score: 15    End of Session Equipment Utilized During Treatment: Gait belt Activity Tolerance: Patient limited by fatigue Patient left: in bed;with call bell/phone within reach;with bed alarm set Nurse Communication: Mobility status PT Visit Diagnosis: Unsteadiness on feet (R26.81)     Time: 5643-3295 PT Time Calculation (min) (ACUTE ONLY): 16 min  Charges:  $Gait Training: 8-22 mins                    G Codes:       Amyriah Buras B. Migdalia Dk PT, DPT Acute Rehabilitation  [phone] Pager [phone]     [place] 12/10/2017, 12:54 PM

## 2017-12-11 LAB — BASIC METABOLIC PANEL
ANION GAP: 7 (ref 5–15)
BUN: 5 mg/dL — ABNORMAL LOW (ref 6–20)
CALCIUM: 8.3 mg/dL — AB (ref 8.9–10.3)
CO2: 24 mmol/L (ref 22–32)
Chloride: 104 mmol/L (ref 101–111)
Creatinine, Ser: 0.52 mg/dL (ref 0.44–1.00)
GFR calc non Af Amer: >60 mL/min (ref >60)
Glucose, Bld: 102 mg/dL — ABNORMAL HIGH (ref 65–99)
Potassium: 3.8 mmol/L (ref 3.5–5.1)
Sodium: 135 mmol/L (ref 135–145)

## 2017-12-11 LAB — CBC
HCT: 28.6 % — ABNORMAL LOW (ref 36.0–46.0)
HEMOGLOBIN: 9.6 g/dL — AB (ref 12.0–15.0)
MCH: 32.4 pg (ref 26.0–34.0)
MCHC: 33.6 g/dL (ref 30.0–36.0)
MCV: 96.6 fL (ref 78.0–100.0)
PLATELETS: 348 10*3/uL (ref 150–400)
RBC: 2.96 MIL/uL — AB (ref 3.87–5.11)
RDW: 13.8 % (ref 11.5–15.5)
WBC: 6.1 10*3/uL (ref 4.0–10.5)

## 2017-12-11 LAB — MAGNESIUM: Magnesium: 1.5 mg/dL — ABNORMAL LOW (ref 1.7–2.4)

## 2017-12-11 MED ORDER — LIDOCAINE VISCOUS HCL 2 % MT SOLN
15.0000 mL | OROMUCOSAL | Status: DC | PRN
  Administered 2017-12-11: 15 mL via OROMUCOSAL
  Filled 2017-12-11: qty 15

## 2017-12-11 MED ORDER — ENOXAPARIN SODIUM 40 MG/0.4ML ~~LOC~~ SOLN
40.0000 mg | SUBCUTANEOUS | Status: DC
  Administered 2017-12-11 – 2017-12-12 (×2): 40 mg via SUBCUTANEOUS
  Filled 2017-12-11 (×2): qty 0.4

## 2017-12-11 MED ORDER — MAGNESIUM SULFATE 4 GM/100ML IV SOLN
4.0000 g | Freq: Once | INTRAVENOUS | Status: AC
  Administered 2017-12-11: 4 g via INTRAVENOUS
  Filled 2017-12-11: qty 100

## 2017-12-11 NOTE — Plan of Care (Signed)
  Problem: Education: Goal: Knowledge of General Education information will improve Outcome: Progressing   Problem: Health Behavior/Discharge Planning: Goal: Ability to manage health-related needs will improve Outcome: Progressing   Problem: Clinical Measurements: Goal: Ability to maintain clinical measurements within normal limits will improve Outcome: Progressing Goal: Will remain free from infection Outcome: Progressing Goal: Diagnostic test results will improve Outcome: Progressing Goal: Respiratory complications will improve Outcome: Progressing Goal: Cardiovascular complication will be avoided Outcome: Progressing   Problem: Activity: Goal: Risk for activity intolerance will decrease Outcome: Progressing   Problem: Nutrition: Goal: Adequate nutrition will be maintained Outcome: Progressing   Problem: Coping: Goal: Level of anxiety will decrease Outcome: Progressing   Problem: Elimination: Goal: Will not experience complications related to bowel motility Outcome: Progressing Goal: Will not experience complications related to urinary retention Outcome: Progressing   Problem: Safety: Goal: Ability to remain free from injury will improve Outcome: Progressing   Problem: Skin Integrity: Goal: Risk for impaired skin integrity will decrease Outcome: Progressing   

## 2017-12-11 NOTE — Progress Notes (Signed)
Occupational Therapy Evaluation Patient Details Name: Jessica [name] MRN: [id number] DOB: 1961/04/03 Today's Date: 12/11/2017    History of Present Illness 57 year old female admitted to ICU with EtOH withdrawal and witnessed seizure 5/28 with hyponatremia, sodium 116.   Clinical Impression   PTA, pt lived with her significant other and was independent with ADL and mobility. Pt currently completing mobility with minguard A and ADL with setup/supervision. Pt fatigues after ADL but feel she can safely DC home with initial 24/7 S. Will follow acutely to facilitate safe DC home.     Follow Up Recommendations  Supervision/Assistance - 24 hour(initial)  Outpatient substance abuse counseling   Equipment Recommendations  Tub/shower seat    Recommendations for Other Services       Precautions / Restrictions Precautions Precautions: Fall Restrictions Weight Bearing Restrictions: No      Mobility Bed Mobility Overal bed mobility: Modified Independent                Transfers Overall transfer level: Needs assistance Equipment used: None Transfers: Sit to/from Stand Sit to Stand: Supervision              Balance Overall balance assessment: Needs assistance;History of Falls Sitting-balance support: No upper extremity supported;Feet supported Sitting balance-Leahy Scale: Good     Standing balance support: Bilateral upper extremity supported;During functional activity Standing balance-Leahy Scale: Fair                   Standardized Balance Assessment Standardized Balance Assessment : Dynamic Gait Index   Dynamic Gait Index Level Surface: Mild Impairment Change in Gait Speed: Normal Gait with Horizontal Head Turns: Mild Impairment Gait with Vertical Head Turns: Normal Gait and Pivot Turn: Mild Impairment Step Over Obstacle: Normal Step Around Obstacles: Normal Steps: Mild Impairment Total Score: 20     ADL either performed or assessed with clinical  judgement   ADL Overall ADL's : Needs assistance/impaired Eating/Feeding: Independent   Grooming: Set up;Standing   Upper Body Bathing: Set up;Sitting   Lower Body Bathing: Supervison/ safety;Set up;Sit to/from stand   Upper Body Dressing : Set up;Sitting   Lower Body Dressing: Set up;Supervision/safety;Sit to/from stand   Toilet Transfer: Min guard;Ambulation   Toileting- Clothing Manipulation and Hygiene: Supervision/safety;Sit to/from stand       Functional mobility during ADLs: Min guard General ADL Comments: Pt fatigues with standin ADL; began to shake but able to complete ADL task; educated on need to sit during ADL and recommended using a shower seat     Vision         Perception     Praxis      Pertinent Vitals/Pain Pain Assessment: 0-10 Pain Score: 5  Pain Location: L groin Pain Descriptors / Indicators: Grimacing;Sore Pain Intervention(s): Limited activity within patient's tolerance;Heat applied;Ice applied(alternate ice/heat)     Hand Dominance Right   Extremity/Trunk Assessment Upper Extremity Assessment Upper Extremity Assessment: Generalized weakness   Lower Extremity Assessment Lower Extremity Assessment: Defer to PT evaluation   Cervical / Trunk Assessment Cervical / Trunk Assessment: Normal   Communication Communication Communication: No difficulties   Cognition Arousal/Alertness: Awake/alert Behavior During Therapy: Anxious(tearful) Overall Cognitive Status: Impaired/Different from baseline Area of Impairment: Safety/judgement;Awareness                       Following Commands: Follows multi-step commands consistently Safety/Judgement: Decreased awareness of safety;Decreased awareness of deficits Awareness: Emergent Problem Solving: Slow processing     General Comments  Exercises     Shoulder Instructions      Home Living Family/patient expects to be discharged to:: Private residence Living Arrangements:  Spouse/significant other Available Help at Discharge: Friend(s);Available PRN/intermittently(boyfriend works) Type of Home: House Home Access: Stairs to enter [employer] of Steps: 3 Entrance Stairs-Rails: None Home Layout: One level     Bathroom Shower/Tub: Tub/shower unit;Walk-in shower   Bathroom Toilet: Handicapped height Bathroom Accessibility: Yes How Accessible: Accessible via walker Home Equipment: None          Prior Functioning/Environment Level of Independence: Independent                 OT Problem List: Decreased strength;Decreased activity tolerance;Impaired balance (sitting and/or standing);Decreased safety awareness;Decreased knowledge of use of DME or AE;Cardiopulmonary status limiting activity;Pain      OT Treatment/Interventions: Self-care/ADL training;Therapeutic exercise;Energy conservation;DME and/or AE instruction;Therapeutic activities;Cognitive remediation/compensation;Patient/family education;Balance training    OT Goals(Current goals can be found in the care plan section) Acute Rehab OT Goals Patient Stated Goal: to get better OT Goal Formulation: With patient Time For Goal Achievement: 12/25/17 Potential to Achieve Goals: Good  OT Frequency: Min 2X/week   Barriers to D/C:            Co-evaluation              AM-PAC PT "6 Clicks" Daily Activity     Outcome Measure Help from another person eating meals?: None Help from another person taking care of personal grooming?: A Little Help from another person toileting, which includes using toliet, bedpan, or urinal?: A Little Help from another person bathing (including washing, rinsing, drying)?: A Little Help from another person to put on and taking off regular upper body clothing?: None Help from another person to put on and taking off regular lower body clothing?: A Little 6 Click Score: 20   End of Session Equipment Utilized During Treatment: Gait belt Nurse  Communication: Mobility status  Activity Tolerance: Patient tolerated treatment well Patient left: in bed;with call bell/phone within reach;with bed alarm set  OT Visit Diagnosis: Unsteadiness on feet (R26.81);History of falling (Z91.81);Other symptoms and signs involving cognitive function;Pain Pain - part of body: (L groin)                Time: 7616-0737 OT Time Calculation (min): 18 min Charges:  OT General Charges $OT Visit: 1 Visit OT Evaluation $OT Eval Moderate Complexity: 1 Mod G-Codes:     Maurie Boettcher, OT/L  OT Clinical Specialist [phone]   [facility] 12/11/2017, 3:20 PM

## 2017-12-11 NOTE — Care Management Note (Signed)
Case Management Note  Patient Details  Name: Jessica [name] MRN: [id number] Date of Birth: May 17, 1961  Subjective/Objective:    Pt admitted with witnessed seizures                Action/Plan:  PTA from home however SNF recommended.  Pt does not have insurance nor PCP however informed CM that she does in fact have a PCP in [facility] Dr Shelia Media that she would like to remain with.  Pt states she gets her prescriptions filled at [facility] and utilizes [employer] for coupons.  Pt is currently refusing SNF and states she has a friend that will stay with her 24/7 at discharge.  Pt may need MATCH at discharge   Expected Discharge Date:                  Expected Discharge Plan:  [place]  In-House Referral:  Clinical Social Work  Discharge planning Services  CM Consult  Post Acute Care Choice:    Choice offered to:     DME Arranged:    DME Agency:     HH Arranged:    HH Agency:     Status of Service:     If discussed at [employer], dates discussed:    Additional Comments:  Maryclare Labrador, RN 12/11/2017, 11:51 AM

## 2017-12-11 NOTE — Progress Notes (Signed)
[place] TEAM [place]  ELF:[id number] DOB: 12/07/1960 DOA: 12/04/2017 PCP: Deland Pretty, MD    Brief Narrative:  57yo F w/ a Hx of EtOH abuse (per her live-in boyfriend of 10 years) who does not work and is not on disability. She reportedly drinks a large bottle of wine daily, though she does not admit to this.  5/26 she developed some nausea and had not been eating or drinking much. In the evening hours of 5/28 she had a witnessed tonic-clonic type seizure.   Upon arrival to the ED she continued with significant tremors.  It was felt this represented alcohol withdrawal. Notably her sodium was 118.   Significant Events: 5/28 admit - seizures - hyponatremia 6/3 precedex stopped   Subjective: Pt is doing very well.  She is much more alert, and is calm and conversant.  She denies n/v, abdom pain, cp, or sob.      Assessment & Plan:  EtOH withdrawal seizure- Altered mental status secondary to hyponatremia + alcohol withdrawal - has required Precedex - d/c CIWA today but continue low dose scheduled ativan w/ plan for taper to off    Hyponatremia Beer potomania? - corrected - monitor   Hypokalemia Corrected   Hypomagnesemia Cont to supplement to goal of 2.0  Hypophosphatemia Corrected  Thrombocytopenia secondary to EtOH abuse - resolved in absence of EtOH  Suicidal ideation boyfriend reported patient has been talking about suicide over the past few weeks - Psychiatry evaluated the pt on 5/30 and reported no evidence of imminent risk to self   DVT prophylaxis: SCDs Code Status: FULL CODE Family Communication: no family present at time of exam  Disposition Plan: transfer to tele bed - cont PT/OT - follow lytes - begin SNF process but suspect pt will refuse   Consultants:  PCCM Psychiatry  Antimicrobials:  none  Objective: Blood pressure (!) 141/88, pulse 98, temperature 99.3 F (37.4 C), temperature source Oral, resp. rate (!) 23,  height 5\' 11"  (1.803 m), weight 58.6 kg (129 lb 3 oz), SpO2 100 %.  Intake/Output Summary (Last 24 hours) at 12/11/2017 0842 Last data filed at 12/11/2017 0400 Gross per 24 hour  Intake 230 ml  Output 2058.3 ml  Net -1828.3 ml   Filed Weights   12/09/17 0500 12/10/17 0500 12/11/17 0500  Weight: 62.7 kg (138 lb 3.7 oz) 60.8 kg (134 lb 0.6 oz) 58.6 kg (129 lb 3 oz)    Examination: General: No acute respiratory distress - alert and conversant  Lungs: Clear to auscultation bilaterally - no wheezing or crackles  Cardiovascular: RRR w/o M G or R Abdomen: NT/ND, soft, bs+, no mass  Extremities: No edema B LE   CBC: Recent Labs  Lab 12/04/17 2133  12/09/17 0346 12/10/17 0433 12/11/17 0302  WBC 9.3   < > 5.3 6.6 6.1  NEUTROABS 6.4  --   --   --   --   HGB 13.3   < > 10.1* 10.1* 9.6*  HCT 37.8   < > 29.9* 29.8* 28.6*  MCV 91.1   < > 96.1 96.4 96.6  PLT 119*   < > 204 313 348   < > = values in this interval not displayed.   Basic Metabolic Panel: Recent Labs  Lab 12/07/17 0400 12/08/17 0104 12/09/17 0346 12/10/17 0433 12/11/17 0302  NA 131* 134* 138 135 135  K 3.1* 3.5 3.2* 3.6 3.8  CL 105 106 105 105 104  CO2 18*  20* 23 23 24   GLUCOSE 131* 108* 105* 95 102*  BUN 6 <5* <5* <5* 5*  CREATININE 0.57 0.49 0.45 0.56 0.52  CALCIUM 8.2* 8.0* 8.3* 8.0* 8.3*  MG 1.3* 1.4* 1.6* 1.5* 1.5*  PHOS 1.9* 4.6 4.5  --   --    GFR: Estimated Creatinine Clearance: 72.6 mL/min (by C-G formula based on SCr of 0.52 mg/dL).  Liver Function Tests: Recent Labs  Lab 12/04/17 2133 12/06/17 0354 12/08/17 0104  AST 129* 60* 53*  ALT 83* 51 44  ALKPHOS 66 56 68  BILITOT 1.9* 1.5* 1.2  PROT 7.4 5.8* 5.7*  ALBUMIN 4.2 3.1* 3.1*   Recent Labs  Lab 12/04/17 2133  LIPASE 36   Recent Labs  Lab 12/04/17 2125  AMMONIA 80*    Coagulation Profile: Recent Labs  Lab 12/04/17 2133  INR 1.00    CBG: Recent Labs  Lab 12/09/17 0720 12/09/17 1141 12/09/17 1552 12/09/17 2151  12/10/17 0803  GLUCAP 109* 162* 91 119* 96    Recent Results (from the past 240 hour(s))  MRSA PCR Screening     Status: None   Collection Time: 12/05/17 12:29 AM  Result Value Ref Range Status   MRSA by PCR NEGATIVE NEGATIVE Final    Comment:        The GeneXpert MRSA Assay (FDA approved for NASAL specimens only), is one component of a comprehensive MRSA colonization surveillance program. It is not intended to diagnose MRSA infection nor to guide or monitor treatment for MRSA infections. Performed at [facility], [place] [address], [place], [place] [address]      Scheduled Meds: . cloNIDine  0.1 mg Oral TID  . folic acid  1 mg Oral Daily  . LORazepam  0.5 mg Oral TID  . mirtazapine  15 mg Oral QHS  . nicotine  21 mg Transdermal Daily  . thiamine  100 mg Oral Daily     LOS: 7 days   Cherene Altes, MD [facility] Office  [phone] Pager - Text Page per Amion as per below:  On-Call/Text Page:      [email or web address]      password [email or web address]  If 7PM-7AM, please contact night-coverage [email or web address] Password [facility] 12/11/2017, 8:42 AM

## 2017-12-12 DIAGNOSIS — F1994 Other psychoactive substance use, unspecified with psychoactive substance-induced mood disorder: Secondary | ICD-10-CM

## 2017-12-12 LAB — COMPREHENSIVE METABOLIC PANEL
ALT: 23 U/L (ref 14–54)
AST: 24 U/L (ref 15–41)
Albumin: 3 g/dL — ABNORMAL LOW (ref 3.5–5.0)
Alkaline Phosphatase: 48 U/L (ref 38–126)
Anion gap: 7 (ref 5–15)
BUN: <5 mg/dL — ABNORMAL LOW (ref 6–20)
CO2: 23 mmol/L (ref 22–32)
Calcium: 8.3 mg/dL — ABNORMAL LOW (ref 8.9–10.3)
Chloride: 104 mmol/L (ref 101–111)
Creatinine, Ser: 0.57 mg/dL (ref 0.44–1.00)
GFR calc Af Amer: >60 mL/min (ref >60)
GFR calc non Af Amer: >60 mL/min (ref >60)
Glucose, Bld: 105 mg/dL — ABNORMAL HIGH (ref 65–99)
Potassium: 3.7 mmol/L (ref 3.5–5.1)
Sodium: 134 mmol/L — ABNORMAL LOW (ref 135–145)
Total Bilirubin: 0.6 mg/dL (ref 0.3–1.2)
Total Protein: 5.9 g/dL — ABNORMAL LOW (ref 6.5–8.1)

## 2017-12-12 LAB — MAGNESIUM: Magnesium: 1.6 mg/dL — ABNORMAL LOW (ref 1.7–2.4)

## 2017-12-12 MED ORDER — MAGNESIUM SULFATE 2 GM/50ML IV SOLN
2.0000 g | Freq: Once | INTRAVENOUS | Status: AC
  Administered 2017-12-12: 2 g via INTRAVENOUS
  Filled 2017-12-12: qty 50

## 2017-12-12 MED ORDER — POTASSIUM CHLORIDE CRYS ER 20 MEQ PO TBCR
40.0000 meq | EXTENDED_RELEASE_TABLET | Freq: Once | ORAL | Status: AC
  Administered 2017-12-12: 40 meq via ORAL
  Filled 2017-12-12: qty 2

## 2017-12-12 MED ORDER — MIRTAZAPINE 15 MG PO TBDP: 15.0000 mg | ORAL_TABLET | Freq: Every day | ORAL | 0 refills | Status: DC

## 2017-12-12 NOTE — Progress Notes (Signed)
Report called to 5MW RN. Pt has belongings and will be in room 5MW-11.

## 2017-12-12 NOTE — Discharge Summary (Signed)
Physician Discharge Summary  Jessica [name] SNK:[id number] DOB: [birth year] DOA: 12/04/2017  PCP: Deland Pretty, MD  Admit date: 12/04/2017 Discharge date: 12/12/2017  Admitted From: Home  Disposition:  Home  Recommendations for Outpatient Follow-up and new medication changes:  1. Follow up with PCP in 1- week 2. Patient placed on mirtazapine 50 mg at night. 3. Recommendation for outpatient neurology follow up for EEG.   Home Health: yes   Equipment/Devices: rolling walker    Discharge Condition: stable  CODE STATUS: full  Diet recommendation:  Heart healthy   Brief/Interim Summary: 57 year old female who presented to the hospital with seizures.  She does have the significant past medical history for alcohol abuse.  Apparently reported depression for the last 3 to 4 months prior to hospitalization, increased alcohol consumption and decreased p.o. intake. On May 28 she was witnessed to have a tonic-clonic seizure, by the time of the initial physical examination, her blood pressure was 162/90, heart rate 153, temperature 97.1, respiratory 26, oxygen saturation 99%.  She was sedated, lungs are clear to auscultation bilaterally, heart S1-S2 present and rhythmic, the abdomen soft nontender, no lower extremity edema.  Sodium was 116, potassium 2.9, chloride 76, bicarb 16, glucose 161, BUN 9, creatinine 0.70, white count 9.3, hemoglobin 13.3, hematocrit 37.8, platelets 119, urinalysis negative for infection, specific gravity 1.006.  Alcohol level 26, drug screen positive for benzodiazepines.  Maxillofacial CT negative for fractures.  Chest x-ray with hyperinflation.  EKG was SVT at 159 bpm and head CT negative for acute changes.  Patient was admitted to the intensive care unit with a working diagnosis of acute severe alcohol withdrawal syndrome, complicated by hyponatremia.   1.  Metabolic encephalopathy due to acute alcohol withdrawal syndrome.  Patient was admitted to the intensive care unit,  received IV dexmedetomidine infusion, frequent neuro checks and aspiration precautions.  Patient received multivitamins including thiamine, as well as benzodiazepines per alcohol withdrawal protocol.  Her mentation progressively improved, she was seen by physical therapy with recommendations for 24-hour assistance.  Patient will be discharged home with home health services.  2.  Severe hyponatremia due to alcohol/water intoxication, complicated by hypokalemia and hypomagnesemia..  Patient was placed on normal saline, sodium was slowly corrected, reaching a serum Na of 134 at discharge.  She has been tolerating diet adequately.  Potassium and magnesium were corrected.   3.  New onset tonic-clonic seizure.  Related to alcohol withdrawal syndrome.  Patient was seen by neurology, no indication for antiepileptic medications.  Recommendations for outpatient electroencephalography.  Brain MRI with no acute intracranial processes.  4.  Depression.  Patient was seen by psychiatry, recommendations to start Remeron 15 g p.o. nightly, may consider gabapentin 300 mg 3 times daily for alcohol cravings.  Patient did not meet criteria for inpatient psychiatric admission.  5.  Tobacco abuse.  Smoking cessation, nicotine patch.  Discharge Diagnoses:  Principal Problem:   Substance induced mood disorder ([place]) Active Problems:   Delirium tremens [facility])    Discharge Instructions   Allergies as of 12/12/2017   No Known Allergies     Medication List    TAKE these medications   cetirizine 10 MG tablet Commonly known as:  ZYRTEC Take 10 mg by mouth daily as needed for allergies.   fluticasone 50 MCG/ACT nasal spray Commonly known as:  FLONASE Place 1-2 sprays into both nostrils daily as needed for allergies or rhinitis.   loratadine 10 MG tablet Commonly known as:  CLARITIN Take 10 mg by mouth  daily as needed for allergies.   mirtazapine 15 MG disintegrating tablet Commonly known as:  REMERON  SOL-TAB Take 1 tablet (15 mg total) by mouth at bedtime.            Durable Medical Equipment  (From admission, onward)        Start     Ordered   12/12/17 1016  For home use only DME Walker rolling  Once    Question:  Patient needs a walker to treat with the following condition  Answer:  Weakness   12/12/17 1015      No Known Allergies  Consultations:  Neurology   Psychiatry    Procedures/Studies: Mr Brain Wo Contrast  Result Date: 12/08/2017 CLINICAL DATA:  Seizure.  Alcohol withdrawal. EXAM: MRI HEAD WITHOUT CONTRAST TECHNIQUE: Multiplanar, multiecho pulse sequences of the brain and surrounding structures were obtained without intravenous contrast. COMPARISON:  CT HEAD Dec 04, 2017 FINDINGS: Mild motion degraded examination. INTRACRANIAL CONTENTS: No reduced diffusion to suggest acute ischemia or status epilepticus. No susceptibility artifact to suggest hemorrhage. Mild parenchymal brain volume loss for age, cavum septum pellucidum. No hydrocephalus. No suspicious parenchymal signal, masses, mass effect. Patchy supratentorial white matter FLAIR T2-hyperintensities. Trace T1 shortening and susceptibility artifact along the mid falx. Faint frontal convexity extra-axial hemosiderin staining. At least 4 RIGHT cerebrum chronic micro hemorrhages. Normal symmetric size, morphology and signal of the hippocampi. VASCULAR: Normal major intracranial vascular flow voids present at skull base. SKULL AND UPPER CERVICAL SPINE: No abnormal sellar expansion. No suspicious calvarial bone marrow signal. Grade 1 C4-5 anterolisthesis. Craniocervical junction maintained. SINUSES/ORBITS: The mastoid air-cells and included paranasal sinuses are well-aerated.The included ocular globes and orbital contents are non-suspicious. OTHER: None. IMPRESSION: 1. No acute intracranial process on this mildly motion degraded examination. 2. Trace subacute parafalcine subdural hematoma. Minimal old subarachnoid  hemorrhage. 3. Mild parenchymal brain volume loss for age. 4. Mild-to-moderate chronic small vessel ischemic changes. Electronically Signed   By: Elon Alas M.D.   On: 12/08/2017 22:32   Dg Chest Port 1 View  Result Date: 12/04/2017 CLINICAL DATA:  Delirium tremens. Tachycardia, acute onset. EXAM: PORTABLE CHEST 1 VIEW COMPARISON:  None. FINDINGS: The lungs are hyperexpanded but appear grossly clear. There is no evidence of focal opacification, pleural effusion or pneumothorax. The cardiomediastinal silhouette is within normal limits. No acute osseous abnormalities are seen. IMPRESSION: Lungs hyperexpanded but grossly clear. Electronically Signed   By: Garald Balding M.D.   On: 12/04/2017 21:53   Ct Head Code Stroke Wo Contrast  Result Date: 12/04/2017 CLINICAL DATA:  Code stroke. Generalized shaking. Last seen normal at 20 100 hours. EXAM: CT HEAD WITHOUT CONTRAST TECHNIQUE: Contiguous axial images were obtained from the base of the skull through the vertex without intravenous contrast. COMPARISON:  None. FINDINGS: Moderate motion degraded examination. BRAIN: No intraparenchymal hemorrhage, mass effect nor midline shift. No parenchymal brain volume loss for age. Cavum septum pellucidum. No hydrocephalus. No acute large vascular territory infarcts. No abnormal extra-axial fluid collections. Basal cisterns are patent. VASCULAR: Unremarkable. SKULL/SOFT TISSUES: No skull fracture. Scattered subcentimeter calvarial lucencies. No significant soft tissue swelling. ORBITS/SINUSES: The included ocular globes and orbital contents are normal.The mastoid aircells and included paranasal sinuses are well-aerated. OTHER: None. ASPECTS [facility] Stroke Program Early CT Score) - Ganglionic level infarction (caudate, lentiform nuclei, internal capsule, insula, M1-M3 cortex): 7 - Supraganglionic infarction (M4-M6 cortex): 3 Total score (0-10 with 10 being normal): 10 IMPRESSION: 1. Moderate motion degraded examination  without acute intracranial process. 2.  ASPECTS is 10. 3. Scattered subcentimeter calvarial lucencies could reflect hemangiomas though if there is a history of cancer, metastatic disease is possible. 4. Critical Value/emergent results text paged to [place], Neurology via AMION secure system on 12/04/2017 at 9:35 pm, including interpreting physician's phone number. Electronically Signed   By: Elon Alas M.D.   On: 12/04/2017 21:36   Ct Maxillofacial Wo Contrast  Result Date: 12/04/2017 CLINICAL DATA:  Blunt maxillofacial trauma EXAM: CT MAXILLOFACIAL WITHOUT CONTRAST TECHNIQUE: Multidetector CT imaging of the maxillofacial structures was performed. Multiplanar CT image reconstructions were also generated. COMPARISON:  Partial comparison to CT head dated 12/04/2017 at 2126 hours FINDINGS: Moderate to severe motion degradation, severely limiting evaluation. Study is almost nondiagnostic, particularly involving the mandible. Osseous: No gross maxillofacial fracture, noting severe motion degradation. Bilateral mandibular condyles appear well-seated in the TMJs. Left frontal calvarial lucency (image 99), incompletely visualized, better evaluated on dedicated CT. Orbits: Bilateral orbits, including the globes and retroconal soft tissues, are grossly unremarkable. Sinuses: Visualized paranasal sinuses and mastoid air cells are grossly clear. Soft tissues: Negative. Limited intracranial: Grossly unremarkable, better evaluated on dedicated head CT. IMPRESSION: Evaluation is severely limited due to motion degradation. Nondiagnostic evaluation of the mandible. Within that constraint, no gross maxillofacial fracture is seen. Electronically Signed   By: Julian Hy M.D.   On: 12/04/2017 21:46       Subjective: Patient feeling better, no nausea or vomiting, no dyspnea or chest pain. Weakness has improved.   Discharge Exam: Vitals:   12/12/17 0450 12/12/17 1005  BP: (!) 151/98 (!) 149/96  Pulse: 91 87   Resp: 18 18  Temp: 98.1 F (36.7 C) 98.4 F (36.9 C)  SpO2: 99% 100%   Vitals:   12/12/17 0002 12/12/17 0046 12/12/17 0450 12/12/17 1005  BP:  (!) 138/97 (!) 151/98 (!) 149/96  Pulse:  92 91 87  Resp:  16 18 18   Temp: 99 F (37.2 C) 98.9 F (37.2 C) 98.1 F (36.7 C) 98.4 F (36.9 C)  TempSrc: Oral Oral Oral Oral  SpO2:  98% 99% 100%  Weight:      Height:        General: Not in pain or dyspnea Neurology: Awake and alert, non focal  E ENT: no pallor, no icterus, oral mucosa moist Cardiovascular: No JVD. S1-S2 present, rhythmic, no gallops, rubs, or murmurs. No lower extremity edema. Pulmonary: vesicular breath sounds bilaterally, adequate air movement, no wheezing, rhonchi or rales. Gastrointestinal. Abdomen flat, no organomegaly, non tender, no rebound or guarding Skin. No rashes Musculoskeletal: no joint deformities   The results of significant diagnostics from this hospitalization (including imaging, microbiology, ancillary and laboratory) are listed below for reference.     Microbiology: Recent Results (from the past 240 hour(s))  MRSA PCR Screening     Status: None   Collection Time: 12/05/17 12:29 AM  Result Value Ref Range Status   MRSA by PCR NEGATIVE NEGATIVE Final    Comment:        The GeneXpert MRSA Assay (FDA approved for NASAL specimens only), is one component of a comprehensive MRSA colonization surveillance program. It is not intended to diagnose MRSA infection nor to guide or monitor treatment for MRSA infections. Performed at [facility], [place] [address], [place], [place] [address]      Labs: BNP (last 3 results) No results for input(s): BNP in the last 8760 hours. Basic Metabolic Panel: Recent Labs  Lab 12/07/17 0400 12/08/17 0104 12/09/17 0346 12/10/17 8101  12/11/17 0302 12/12/17 0433  NA 131* 134* 138 135 135 134*  K 3.1* 3.5 3.2* 3.6 3.8 3.7  CL 105 106 105 105 104 104  CO2 18* 20* 23 23 24 23   GLUCOSE 131* 108* 105*  95 102* 105*  BUN 6 <5* <5* <5* 5* <5*  CREATININE 0.57 0.49 0.45 0.56 0.52 0.57  CALCIUM 8.2* 8.0* 8.3* 8.0* 8.3* 8.3*  MG 1.3* 1.4* 1.6* 1.5* 1.5* 1.6*  PHOS 1.9* 4.6 4.5  --   --   --    Liver Function Tests: Recent Labs  Lab 12/06/17 0354 12/08/17 0104 12/12/17 0433  AST 60* 53* 24  ALT 51 44 23  ALKPHOS 56 68 48  BILITOT 1.5* 1.2 0.6  PROT 5.8* 5.7* 5.9*  ALBUMIN 3.1* 3.1* 3.0*   No results for input(s): LIPASE, AMYLASE in the last 168 hours. No results for input(s): AMMONIA in the last 168 hours. CBC: Recent Labs  Lab 12/07/17 0400 12/08/17 0104 12/09/17 0346 12/10/17 0433 12/11/17 0302  WBC 6.6 7.1 5.3 6.6 6.1  HGB 10.1* 9.6* 10.1* 10.1* 9.6*  HCT 29.6* 28.1* 29.9* 29.8* 28.6*  MCV 94.9 94.6 96.1 96.4 96.6  PLT 78* 118* 204 313 348   Cardiac Enzymes: No results for input(s): CKTOTAL, CKMB, CKMBINDEX, TROPONINI in the last 168 hours. BNP: Invalid input(s): POCBNP CBG: Recent Labs  Lab 12/09/17 0720 12/09/17 1141 12/09/17 1552 12/09/17 2151 12/10/17 0803  GLUCAP 109* 162* 91 119* 96   D-Dimer No results for input(s): DDIMER in the last 72 hours. Hgb A1c No results for input(s): HGBA1C in the last 72 hours. Lipid Profile No results for input(s): CHOL, HDL, LDLCALC, TRIG, CHOLHDL, LDLDIRECT in the last 72 hours. Thyroid function studies No results for input(s): TSH, T4TOTAL, T3FREE, THYROIDAB in the last 72 hours.  Invalid input(s): FREET3 Anemia work up No results for input(s): VITAMINB12, FOLATE, FERRITIN, TIBC, IRON, RETICCTPCT in the last 72 hours. Urinalysis    Component Value Date/Time   COLORURINE YELLOW 12/05/2017 0226   APPEARANCEUR CLEAR 12/05/2017 0226   LABSPEC 1.006 12/05/2017 0226   PHURINE 6.0 12/05/2017 0226   GLUCOSEU NEGATIVE 12/05/2017 0226   HGBUR NEGATIVE 12/05/2017 0226   BILIRUBINUR NEGATIVE 12/05/2017 0226   KETONESUR 5 (A) 12/05/2017 0226   PROTEINUR NEGATIVE 12/05/2017 0226   NITRITE NEGATIVE 12/05/2017 0226    LEUKOCYTESUR NEGATIVE 12/05/2017 0226   Sepsis Labs Invalid input(s): PROCALCITONIN,  WBC,  LACTICIDVEN Microbiology Recent Results (from the past 240 hour(s))  MRSA PCR Screening     Status: None   Collection Time: 12/05/17 12:29 AM  Result Value Ref Range Status   MRSA by PCR NEGATIVE NEGATIVE Final    Comment:        The GeneXpert MRSA Assay (FDA approved for NASAL specimens only), is one component of a comprehensive MRSA colonization surveillance program. It is not intended to diagnose MRSA infection nor to guide or monitor treatment for MRSA infections. Performed at [facility], [place] [address], [place], [place] [address]      Time coordinating discharge: 45 minutes  SIGNED:   Tawni Millers, MD  [facility] 12/12/2017, 12:27 PM Pager [phone]  If 7PM-7AM, please contact night-coverage [email or web address] Password [email or web address]

## 2017-12-12 NOTE — Care Management Note (Signed)
Case Management Note  Patient Details  Name: ILENE WITCHER MRN: [id number] Date of Birth: 1961/03/13  Subjective/Objective:                 Spoke w patient, she states she would like a RW for DC, placed order, requested for delivery to room today prior to DC. We spoke of SNF, she continues to decline, states she will do fine at home w RW, states she can get up out of bed and ambulate, she has a one story house, her boyfriend will be home Saturday and Sunday, and may take off Thursday and Friday to help her around the house. She states she has PCP and has no problems obtaining her medications with [employer] discounts. No further CM needs at this time.   Action/Plan:  RW to be delivered to room, declines HH.   Expected Discharge Date:                  Expected Discharge Plan:  Home/Self Care  In-House Referral:  Clinical Social Work  Discharge planning Services  CM Consult  Post Acute Care Choice:    Choice offered to:     DME Arranged:  Walker rolling DME Agency:  [place]:    [place]:     Status of Service:  Completed, signed off  If discussed at [employer] of Stay Meetings, dates discussed:    Additional Comments:  Carles Collet, RN 12/12/2017, 10:17 AM

## 2017-12-12 NOTE — Progress Notes (Signed)
Physical Therapy Treatment and Discharge Patient Details Name: Jessica [name] MRN: [id number] DOB: Nov 22, 1960 Today's Date: 12/12/2017    History of Present Illness 57 year old female admitted to ICU with EtOH withdrawal and witnessed seizure 5/28 with hyponatremia, sodium 116.    PT Comments    Continuing work on functional mobility and activity tolerance;  Noting very good improvements in gait and balance, and pt has met the majority of Acute PT goals; will dc acute PT   Follow Up Recommendations  Supervision/Assistance - 24 hour;No PT follow up(Noted pt is declining HHPT; Rec intensive Outpt Therapy for alcoholism)     Equipment Recommendations  None recommended by PT    Recommendations for Other Services       Precautions / Restrictions Precautions Precautions: Fall Precaution Comments: Fall risk much improved since last PT session Restrictions Weight Bearing Restrictions: No    Mobility  Bed Mobility Overal bed mobility: Independent                Transfers   Equipment used: None Transfers: Sit to/from Stand Sit to Stand: Independent         General transfer comment: do difficulty  Ambulation/Gait Ambulation/Gait assistance: Supervision;Independent Ambulation Distance (Feet): 1000 Feet Assistive device: None Gait Pattern/deviations: WFL(Within Functional Limits)     General Gait Details: Long walk in hallways including through other units in the building; overall smooth gait; one instance of unsteadiness from which pt recovered independently; walked backwards and braided without difficulty   Stairs             Wheelchair Mobility    Modified Rankin (Stroke Patients Only)       Balance                                            Cognition Arousal/Alertness: Awake/alert Behavior During Therapy: WFL for tasks assessed/performed Overall Cognitive Status: Within Functional Limits for tasks assessed(for simple mobility  tasks)                                 General Comments: Tells me she has been sober in the past for 3 years; I encourage her to keep it up, one day at a time      Exercises      General Comments        Pertinent Vitals/Pain Pain Assessment: Faces Faces Pain Scale: Hurts a little bit Pain Location: back pain and stiffness Pain Descriptors / Indicators: Grimacing;Sore Pain Intervention(s): Monitored during session    Home Living                      Prior Function            PT Goals (current goals can now be found in the care plan section) Acute Rehab PT Goals Patient Stated Goal: to get better Progress towards PT goals: Goals met/education completed, patient discharged from PT    Frequency    Min 3X/week      PT Plan Discharge plan needs to be updated    Co-evaluation              AM-PAC PT "6 Clicks" Daily Activity  Outcome Measure  Difficulty turning over in bed (including adjusting bedclothes, sheets and blankets)?: None Difficulty moving from lying on back to  sitting on the side of the bed? : None Difficulty sitting down on and standing up from a chair with arms (e.g., wheelchair, bedside commode, etc,.)?: None Help needed moving to and from a bed to chair (including a wheelchair)?: None Help needed walking in hospital room?: None Help needed climbing 3-5 steps with a railing? : A Little 6 Click Score: 23    End of Session Equipment Utilized During Treatment: Gait belt Activity Tolerance: Patient tolerated treatment well Patient left: in chair;with call bell/phone within reach Nurse Communication: Mobility status PT Visit Diagnosis: Unsteadiness on feet (R26.81)     Time: 1791-5056 PT Time Calculation (min) (ACUTE ONLY): 17 min  Charges:  $Gait Training: 8-22 mins                    G Codes:       Roney Marion, PT  Acute Rehabilitation Services Pager [phone] Office [phone]    Jessica [name] 12/12/2017, 3:14 PM

## 2021-09-19 ENCOUNTER — Ambulatory Visit
Admission: RE | Admit: 2021-09-19 | Discharge: 2021-09-19 | Disposition: A | Discharge status: Home / Self Care | Payer: No Typology Code available for payment source | Source: Ambulatory Visit | Attending: Family Medicine | Admitting: Family Medicine

## 2021-09-19 ENCOUNTER — Other Ambulatory Visit: Payer: Self-pay | Admitting: Family Medicine

## 2021-09-19 DIAGNOSIS — W19XXXA Unspecified fall, initial encounter: Secondary | ICD-10-CM

## 2021-09-21 ENCOUNTER — Emergency Department (HOSPITAL_COMMUNITY)
Admission: EM | Admit: 2021-09-21 | Discharge: 2021-09-21 | Disposition: A | Discharge status: Home / Self Care | Payer: Self-pay | Attending: Emergency Medicine | Admitting: Emergency Medicine

## 2021-09-21 ENCOUNTER — Encounter (HOSPITAL_COMMUNITY): Payer: Self-pay

## 2021-09-21 DIAGNOSIS — S42201A Unspecified fracture of upper end of right humerus, initial encounter for closed fracture: Secondary | ICD-10-CM | POA: Insufficient documentation

## 2021-09-21 DIAGNOSIS — M25511 Pain in right shoulder: Secondary | ICD-10-CM | POA: Insufficient documentation

## 2021-09-21 DIAGNOSIS — W01198A Fall on same level from slipping, tripping and stumbling with subsequent striking against other object, initial encounter: Secondary | ICD-10-CM | POA: Insufficient documentation

## 2021-09-21 DIAGNOSIS — R42 Dizziness and giddiness: Secondary | ICD-10-CM | POA: Insufficient documentation

## 2021-09-21 MED ORDER — MORPHINE SULFATE 15 MG PO TABS: 7.5000 mg | ORAL_TABLET | ORAL | 0 refills | Status: DC | PRN

## 2021-09-21 MED ORDER — KETOROLAC TROMETHAMINE 15 MG/ML IJ SOLN
15.0000 mg | Freq: Once | INTRAMUSCULAR | Status: AC
  Administered 2021-09-21: 15 mg via INTRAMUSCULAR
  Filled 2021-09-21: qty 1

## 2021-09-21 MED ORDER — DICLOFENAC SODIUM 1 % EX GEL: 4.0000 g | Freq: Four times a day (QID) | CUTANEOUS | 0 refills | Status: DC

## 2021-09-21 MED ORDER — OXYCODONE HCL 5 MG PO TABS
5.0000 mg | ORAL_TABLET | Freq: Once | ORAL | Status: AC
  Administered 2021-09-21: 5 mg via ORAL
  Filled 2021-09-21: qty 1

## 2021-09-21 MED ORDER — ACETAMINOPHEN 500 MG PO TABS
1000.0000 mg | ORAL_TABLET | Freq: Once | ORAL | Status: AC
  Administered 2021-09-21: 1000 mg via ORAL
  Filled 2021-09-21: qty 2

## 2021-09-21 NOTE — ED Provider Notes (Signed)
?[facility] DEPT ?Provider Note ? ? ?CSN: [id number] ?Arrival date & time: 09/21/21  1357 ? ?  ? ?History ? ?Chief Complaint  ?Patient presents with  ? Fall  ? ? ?Jessica [name] is a 61 y.o. female. ? ?61 yo F with chief complaints of right shoulder pain.  The patient had a fall on Thursday which was about 6 days ago.  Had lost her balance and fell onto the right side.  She struck her head but did not think it was that severe.  Has some dizziness at baseline. ? ?The history is provided by the patient.  ?Fall ? ? ?  ? ?Home Medications ?Prior to Admission medications   ?Medication Sig Start Date End Date Taking? Authorizing Provider  ?diclofenac Sodium (VOLTAREN) 1 % GEL Apply 4 g topically 4 (four) times daily. 09/21/21  Yes Deno Etienne, DO  ?morphine (MSIR) 15 MG tablet Take 0.5 tablets (7.5 mg total) by mouth every 4 (four) hours as needed for severe pain. 09/21/21  Yes Deno Etienne, DO  ?cetirizine (ZYRTEC) 10 MG tablet Take 10 mg by mouth daily as needed for allergies.    [provider]  ?fluticasone (FLONASE) 50 MCG/ACT nasal spray Place 1-2 sprays into both nostrils daily as needed for allergies or rhinitis.    [provider]  ?loratadine (CLARITIN) 10 MG tablet Take 10 mg by mouth daily as needed for allergies.    [provider]  ?mirtazapine (REMERON SOL-TAB) 15 MG disintegrating tablet Take 1 tablet (15 mg total) by mouth at bedtime. 12/12/17 01/11/18  Arrien, Jimmy Picket, MD  ?   ? ?Allergies    ?Patient has no known allergies.   ? ?Review of Systems   ?Review of Systems ? ?Physical Exam ?Updated Vital Signs ?BP (!) 143/86 (BP Location: Left Arm)   Pulse (!) 106   Temp 98.1 ?F (36.7 ?C) (Oral)   Resp 18   SpO2 95%  ?Physical Exam ?Vitals and nursing note reviewed.  ?Constitutional:   ?   General: She is not in acute distress. ?   Appearance: She is well-developed. She is not diaphoretic.  ?HENT:  ?   Head: Normocephalic and atraumatic.  ?Eyes:  ?    Pupils: Pupils are equal, round, and reactive to light.  ?Cardiovascular:  ?   Rate and Rhythm: Normal rate and regular rhythm.  ?   Heart sounds: No murmur heard. ?  No friction rub. No gallop.  ?Pulmonary:  ?   Effort: Pulmonary effort is normal.  ?   Breath sounds: No wheezing or rales.  ?Abdominal:  ?   General: There is no distension.  ?   Palpations: Abdomen is soft.  ?   Tenderness: There is no abdominal tenderness.  ?Musculoskeletal:     ?   General: No tenderness.  ?   Cervical back: Normal range of motion and neck supple.  ?   Comments: Swelling and bruising to the right arm.  Full range of motion of the elbow without tenderness.  Pain at the proximal humerus.  Pulse motor and sensation intact distally.  No pain along the clavicle or the scapula.  ?Skin: ?   General: Skin is warm and dry.  ?Neurological:  ?   Mental Status: She is alert and oriented to person, place, and time.  ?Psychiatric:     ?   Behavior: Behavior normal.  ? ? ?ED Results / Procedures / Treatments   ?Labs ?(all labs ordered are  listed, but only abnormal results are displayed) ?Labs Reviewed - No data to display ? ?EKG ?None ? ?Radiology ?No results found. ? ?Procedures ?Procedures  ? ? ?Medications Ordered in ED ?Medications  ?acetaminophen (TYLENOL) tablet 1,000 mg (1,000 mg Oral Given 09/21/21 1512)  ?ketorolac (TORADOL) 15 MG/ML injection 15 mg (15 mg Intramuscular Given 09/21/21 1511)  ?oxyCODONE (Oxy IR/ROXICODONE) immediate release tablet 5 mg (5 mg Oral Given 09/21/21 1512)  ? ? ?ED Course/ Medical Decision Making/ A&P ?  ?                        ?Medical Decision Making ?Risk ?OTC drugs. ?Prescription drug management. ? ? ?61 yo F with a chief complaints of right shoulder pain.  This was after a fall about 6 days ago.  She was seen in urgent care today and found to have a proximal humerus fracture and was sent here for evaluation.  I am able to pull up the images and my independent interpretation shows a proximal humerus fracture  with displacement.  She has intact pulse motor and sensation.  We will have her follow-up with orthopedics in the office.  Placed in a sling. ? ?She does have signs of bruising to the right side of her forehead.  I discussed with her about CT imaging of the head with recurrent dizziness however the patient states she has that at baseline and has not changed and prefer not to have a CT of the head performed at this time.  Shared decision making at bedside. ? ?3:12 PM:  I have discussed the diagnosis/risks/treatment options with the patient.  Evaluation and diagnostic testing in the emergency department does not suggest an emergent condition requiring admission or immediate intervention beyond what has been performed at this time.  They will follow up with  Ortho. We also discussed returning to the ED immediately if new or worsening sx occur. We discussed the sx which are most concerning (e.g., sudden worsening pain, fever, inability to tolerate by mouth) that necessitate immediate return. Medications administered to the patient during their visit and any new prescriptions provided to the patient are listed below. ? ?Medications given during this visit ?Medications  ?acetaminophen (TYLENOL) tablet 1,000 mg (1,000 mg Oral Given 09/21/21 1512)  ?ketorolac (TORADOL) 15 MG/ML injection 15 mg (15 mg Intramuscular Given 09/21/21 1511)  ?oxyCODONE (Oxy IR/ROXICODONE) immediate release tablet 5 mg (5 mg Oral Given 09/21/21 1512)  ? ? ? ?The patient appears reasonably screen and/or stabilized for discharge and I doubt any other medical condition or other [facility] requiring further screening, evaluation, or treatment in the ED at this time prior to discharge.  ? ? ? ? ? ? ? ? ?Final Clinical Impression(s) / ED Diagnoses ?Final diagnoses:  ?Closed fracture of proximal end of right humerus, unspecified fracture morphology, initial encounter  ? ? ?Rx / DC Orders ?ED Discharge Orders   ? ?      Ordered  ?  morphine (MSIR) 15 MG tablet   Every 4 hours PRN       ? 09/21/21 1459  ?  diclofenac Sodium (VOLTAREN) 1 % GEL  4 times daily       ? 09/21/21 1459  ? ?  ?  ? ?  ? ? ?  ?Deno Etienne, DO ?09/21/21 1512 ? ?

## 2021-09-21 NOTE — ED Triage Notes (Signed)
Pt arrived via POV, states she got dizzy several days ago, fell, has had right shoulder/arm pain since. States she was seen and had xrays done. States dislocated and multiple fractures.  ?

## 2021-09-21 NOTE — Discharge Instructions (Addendum)
Use the gel as prescribed ?Also take tylenol '1000mg'$ (2 extra strength) four times a day.  ? ?Then take the pain medicine if you feel like you need it. Narcotics do not help with the pain, they only make you care about it less.  You can become addicted to this, people may break into your house to steal it.  It will constipate you.  If you drive under the influence of this medicine you can get a DUI.  3 ? ?Follow up with ortho in the office.  ? ?You are in a sling for comfort.  You need to take your arm in the sling at least 4 times a day and perform range of motion exercises. ? ?

## 2021-09-26 ENCOUNTER — Ambulatory Visit (INDEPENDENT_AMBULATORY_CARE_PROVIDER_SITE_OTHER): Payer: Self-pay | Admitting: Orthopaedic Surgery

## 2021-09-26 ENCOUNTER — Other Ambulatory Visit: Payer: Self-pay

## 2021-09-26 ENCOUNTER — Other Ambulatory Visit (HOSPITAL_BASED_OUTPATIENT_CLINIC_OR_DEPARTMENT_OTHER): Payer: Self-pay

## 2021-09-26 DIAGNOSIS — S42291A Other displaced fracture of upper end of right humerus, initial encounter for closed fracture: Secondary | ICD-10-CM

## 2021-09-26 MED ORDER — MORPHINE SULFATE 15 MG PO TABS
7.5000 mg | ORAL_TABLET | ORAL | 0 refills | Status: DC | PRN
  Filled 2021-09-26: qty 25, 9d supply, fill #0

## 2021-09-26 NOTE — Progress Notes (Signed)
? ?                            ? ? ?Chief Complaint: Right shoulder fracture ?  ? ? ?History of Present Illness:  ? ? ?Jessica [name] is a 61 y.o. female right-hand-dominant female presents with right shoulder pain and bruising after a fall on March 9 at which point she fell directly over her cat landed onto a bookcase.  She is right-hand dominant.  She initially presented to the emergency room on March 15 and was found to have a proximal humerus fracture.  She was given a sling but is not wearing this as she states that it feels uncomfortable.  She has been keeping the arm at her side.  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  She denies any numbness or weakness from the elbow down.  She was given a prescription for morphine immediate release although she has run out of this and is requesting more.  She is also using Tylenol for pain control.  She smokes approximately a pack a day. ? ? ? ?Surgical History:   ?None ? ?PMH/PSH/Family History/Social History/Meds/Allergies:   ?No past medical history on file. ?No past surgical history on file. ?Social History  ? ?Socioeconomic History  ? Marital status: Single  ?  Spouse name: Not on file  ? Number of children: Not on file  ? Years of education: Not on file  ? Highest education level: Not on file  ?Occupational History  ? Not on file  ?Tobacco Use  ? Smoking status: Every Day  ?  Packs/day: 1.00  ?  Types: Cigarettes  ? Smokeless tobacco: Never  ?Substance and Sexual Activity  ? Alcohol use: Yes  ?  Comment: answer varies at present "1 bottle a night" or " 3-4 glasses of wine"  ? Drug use: Never  ? Sexual activity: Not on file  ?Other Topics Concern  ? Not on file  ?Social History Narrative  ? Not on file  ? ?Social Determinants of Health  ? ?Financial Resource Strain: Not on file  ?Food Insecurity: Not on file  ?Transportation Needs: Not on file  ?Physical Activity: Not on file  ?Stress: Not on  file  ?Social Connections: Not on file  ? ?No family history on file. ?No Known Allergies ?Current Outpatient Medications  ?Medication Sig Dispense Refill  ? cetirizine (ZYRTEC) 10 MG tablet Take 10 mg by mouth daily as needed for allergies.    ? diclofenac Sodium (VOLTAREN) 1 % GEL Apply 4 g topically 4 (four) times daily. 100 g 0  ? fluticasone (FLONASE) 50 MCG/ACT nasal spray Place 1-2 sprays into both nostrils daily as needed for allergies or rhinitis.    ? loratadine (CLARITIN) 10 MG tablet Take 10 mg by mouth daily as needed for allergies.    ? mirtazapine (REMERON SOL-TAB) 15 MG disintegrating tablet Take 1 tablet (15 mg total) by mouth at bedtime. 30 tablet 0  ? morphine (MSIR) 15 MG tablet Take 0.5 tablets (7.5 mg total) by mouth every 4 (four) hours as needed for severe pain. 5 tablet 0  ? ?No current facility-administered medications for this visit.  ? ?No results found. ? ?Review of Systems:   ?A ROS was performed including pertinent positives and negatives as documented in the HPI. ? ?Physical Exam :   ?Constitutional: NAD and appears  stated age ?Neurological: Alert and oriented ?Psych: Appropriate affect and cooperative ?There were no vitals taken for this visit.  ? ?Comprehensive Musculoskeletal Exam:   ? ?Right shoulder with bruising and swelling that is running past the elbow.  Sensation is intact in the axillary distribution.  She is able to flex and extend at the right elbow and right wrist.  Fires EPL.  2+ radial pulse.  This is symmetric to the contralateral side.  Sensation is intact in all distributions ? ?Imaging:   ?Xray (2 views right shoulder): ?Valgus impacted 4 part proximal humerus fracture with 5 mm of the diplacement ? ? ? ?I personally reviewed and interpreted the radiographs. ? ? ?Assessment:   ?61 year old female right-hand-dominant with a right proximal humerus fracture after a fall into a bookcase.  Overall I did describe that the valgus impacted nature of the fracture does bode  well for healing.  She is very hesitant to have surgery and would like to avoid this at all cost.  We did discuss that smoking increases the risk factors for nonunion.  That being said we did discuss the long-term role of possible reverse arthroplasty for fracture should she not go on to healing.  At this time she would like to try nonoperative management as she has significant reservations of surgery.  I do believe that this is reasonable given the valgus impacted nature of the injury.  We will plan for nonweightbearing on the right upper extremity for 1 month and reassess her at that point.  At that point I will recheck for some stability of the fractures that we may begin passive range of motion. ? ?Plan :   ? ?-Plan for return to clinic in 1 month ? ? ? ? ?I personally saw and evaluated the patient, and participated in the management and treatment plan. ? ?Vanetta Mulders, MD ?Attending Physician, Orthopedic Surgery ? ?This document was dictated using Systems analyst. A reasonable attempt at proof reading has been made to minimize errors. ?

## 2021-10-22 ENCOUNTER — Emergency Department (HOSPITAL_COMMUNITY): Payer: Self-pay | Admitting: Certified Registered"

## 2021-10-22 ENCOUNTER — Telehealth: Payer: Self-pay | Admitting: Student

## 2021-10-22 ENCOUNTER — Other Ambulatory Visit: Payer: Self-pay

## 2021-10-22 ENCOUNTER — Emergency Department (HOSPITAL_COMMUNITY): Payer: Self-pay

## 2021-10-22 ENCOUNTER — Encounter (HOSPITAL_COMMUNITY): Payer: Self-pay

## 2021-10-22 ENCOUNTER — Inpatient Hospital Stay (HOSPITAL_COMMUNITY)
Admission: EM | Admit: 2021-10-22 | Discharge: 2021-12-06 | DRG: 853 | Disposition: A | Discharge status: Home Health | Payer: Self-pay | Attending: Internal Medicine | Admitting: Internal Medicine

## 2021-10-22 ENCOUNTER — Encounter (HOSPITAL_COMMUNITY)
Admission: EM | Disposition: A | Discharge status: Home Health | Payer: Self-pay | Source: Home / Self Care | Attending: Internal Medicine

## 2021-10-22 DIAGNOSIS — Z7189 Other specified counseling: Secondary | ICD-10-CM

## 2021-10-22 DIAGNOSIS — I10 Essential (primary) hypertension: Secondary | ICD-10-CM | POA: Diagnosis present

## 2021-10-22 DIAGNOSIS — E870 Hyperosmolality and hypernatremia: Secondary | ICD-10-CM

## 2021-10-22 DIAGNOSIS — E878 Other disorders of electrolyte and fluid balance, not elsewhere classified: Secondary | ICD-10-CM | POA: Diagnosis present

## 2021-10-22 DIAGNOSIS — R34 Anuria and oliguria: Secondary | ICD-10-CM | POA: Diagnosis not present

## 2021-10-22 DIAGNOSIS — F17200 Nicotine dependence, unspecified, uncomplicated: Secondary | ICD-10-CM | POA: Diagnosis present

## 2021-10-22 DIAGNOSIS — J690 Pneumonitis due to inhalation of food and vomit: Secondary | ICD-10-CM | POA: Diagnosis present

## 2021-10-22 DIAGNOSIS — R7881 Bacteremia: Secondary | ICD-10-CM | POA: Diagnosis present

## 2021-10-22 DIAGNOSIS — K219 Gastro-esophageal reflux disease without esophagitis: Secondary | ICD-10-CM | POA: Diagnosis present

## 2021-10-22 DIAGNOSIS — R531 Weakness: Secondary | ICD-10-CM

## 2021-10-22 DIAGNOSIS — E874 Mixed disorder of acid-base balance: Secondary | ICD-10-CM | POA: Diagnosis not present

## 2021-10-22 DIAGNOSIS — Z781 Physical restraint status: Secondary | ICD-10-CM

## 2021-10-22 DIAGNOSIS — E43 Unspecified severe protein-calorie malnutrition: Secondary | ICD-10-CM | POA: Diagnosis present

## 2021-10-22 DIAGNOSIS — Z933 Colostomy status: Secondary | ICD-10-CM

## 2021-10-22 DIAGNOSIS — E876 Hypokalemia: Secondary | ICD-10-CM | POA: Diagnosis not present

## 2021-10-22 DIAGNOSIS — D696 Thrombocytopenia, unspecified: Secondary | ICD-10-CM | POA: Diagnosis present

## 2021-10-22 DIAGNOSIS — E8722 Chronic metabolic acidosis: Secondary | ICD-10-CM

## 2021-10-22 DIAGNOSIS — S42201D Unspecified fracture of upper end of right humerus, subsequent encounter for fracture with routine healing: Secondary | ICD-10-CM

## 2021-10-22 DIAGNOSIS — E46 Unspecified protein-calorie malnutrition: Secondary | ICD-10-CM

## 2021-10-22 DIAGNOSIS — E8770 Fluid overload, unspecified: Secondary | ICD-10-CM | POA: Diagnosis present

## 2021-10-22 DIAGNOSIS — R7989 Other specified abnormal findings of blood chemistry: Secondary | ICD-10-CM | POA: Diagnosis present

## 2021-10-22 DIAGNOSIS — K631 Perforation of intestine (nontraumatic): Secondary | ICD-10-CM

## 2021-10-22 DIAGNOSIS — Z1611 Resistance to penicillins: Secondary | ICD-10-CM | POA: Diagnosis present

## 2021-10-22 DIAGNOSIS — D638 Anemia in other chronic diseases classified elsewhere: Secondary | ICD-10-CM | POA: Diagnosis present

## 2021-10-22 DIAGNOSIS — A4159 Other Gram-negative sepsis: Principal | ICD-10-CM | POA: Diagnosis present

## 2021-10-22 DIAGNOSIS — K222 Esophageal obstruction: Secondary | ICD-10-CM

## 2021-10-22 DIAGNOSIS — R7401 Elevation of levels of liver transaminase levels: Secondary | ICD-10-CM | POA: Diagnosis present

## 2021-10-22 DIAGNOSIS — T8143XA Infection following a procedure, organ and space surgical site, initial encounter: Secondary | ICD-10-CM | POA: Diagnosis present

## 2021-10-22 DIAGNOSIS — F17210 Nicotine dependence, cigarettes, uncomplicated: Secondary | ICD-10-CM | POA: Diagnosis present

## 2021-10-22 DIAGNOSIS — R1312 Dysphagia, oropharyngeal phase: Secondary | ICD-10-CM | POA: Diagnosis present

## 2021-10-22 DIAGNOSIS — Z515 Encounter for palliative care: Secondary | ICD-10-CM

## 2021-10-22 DIAGNOSIS — B965 Pseudomonas (aeruginosa) (mallei) (pseudomallei) as the cause of diseases classified elsewhere: Secondary | ICD-10-CM | POA: Diagnosis not present

## 2021-10-22 DIAGNOSIS — R000 Tachycardia, unspecified: Secondary | ICD-10-CM

## 2021-10-22 DIAGNOSIS — E871 Hypo-osmolality and hyponatremia: Secondary | ICD-10-CM | POA: Diagnosis present

## 2021-10-22 DIAGNOSIS — E860 Dehydration: Secondary | ICD-10-CM | POA: Diagnosis present

## 2021-10-22 DIAGNOSIS — K5720 Diverticulitis of large intestine with perforation and abscess without bleeding: Secondary | ICD-10-CM | POA: Diagnosis present

## 2021-10-22 DIAGNOSIS — F419 Anxiety disorder, unspecified: Secondary | ICD-10-CM | POA: Diagnosis present

## 2021-10-22 DIAGNOSIS — K651 Peritoneal abscess: Secondary | ICD-10-CM | POA: Diagnosis present

## 2021-10-22 DIAGNOSIS — E8809 Other disorders of plasma-protein metabolism, not elsewhere classified: Secondary | ICD-10-CM | POA: Diagnosis present

## 2021-10-22 DIAGNOSIS — W19XXXD Unspecified fall, subsequent encounter: Secondary | ICD-10-CM | POA: Diagnosis present

## 2021-10-22 DIAGNOSIS — F5101 Primary insomnia: Secondary | ICD-10-CM | POA: Diagnosis present

## 2021-10-22 DIAGNOSIS — F10231 Alcohol dependence with withdrawal delirium: Secondary | ICD-10-CM | POA: Diagnosis not present

## 2021-10-22 DIAGNOSIS — G8929 Other chronic pain: Secondary | ICD-10-CM | POA: Diagnosis present

## 2021-10-22 DIAGNOSIS — G9341 Metabolic encephalopathy: Secondary | ICD-10-CM | POA: Diagnosis present

## 2021-10-22 DIAGNOSIS — R636 Underweight: Secondary | ICD-10-CM | POA: Diagnosis present

## 2021-10-22 DIAGNOSIS — Z681 Body mass index (BMI) 19 or less, adult: Secondary | ICD-10-CM

## 2021-10-22 DIAGNOSIS — N179 Acute kidney failure, unspecified: Secondary | ICD-10-CM | POA: Diagnosis present

## 2021-10-22 DIAGNOSIS — K650 Generalized (acute) peritonitis: Secondary | ICD-10-CM | POA: Diagnosis present

## 2021-10-22 DIAGNOSIS — B961 Klebsiella pneumoniae [K. pneumoniae] as the cause of diseases classified elsewhere: Secondary | ICD-10-CM | POA: Diagnosis present

## 2021-10-22 DIAGNOSIS — S42209A Unspecified fracture of upper end of unspecified humerus, initial encounter for closed fracture: Secondary | ICD-10-CM | POA: Diagnosis present

## 2021-10-22 DIAGNOSIS — Z79899 Other long term (current) drug therapy: Secondary | ICD-10-CM

## 2021-10-22 DIAGNOSIS — A419 Sepsis, unspecified organism: Secondary | ICD-10-CM

## 2021-10-22 DIAGNOSIS — D62 Acute posthemorrhagic anemia: Secondary | ICD-10-CM | POA: Diagnosis not present

## 2021-10-22 DIAGNOSIS — R64 Cachexia: Secondary | ICD-10-CM | POA: Diagnosis present

## 2021-10-22 DIAGNOSIS — F1994 Other psychoactive substance use, unspecified with psychoactive substance-induced mood disorder: Secondary | ICD-10-CM | POA: Diagnosis present

## 2021-10-22 DIAGNOSIS — S42309A Unspecified fracture of shaft of humerus, unspecified arm, initial encounter for closed fracture: Secondary | ICD-10-CM | POA: Diagnosis present

## 2021-10-22 DIAGNOSIS — K567 Ileus, unspecified: Secondary | ICD-10-CM | POA: Diagnosis not present

## 2021-10-22 DIAGNOSIS — D649 Anemia, unspecified: Secondary | ICD-10-CM | POA: Diagnosis present

## 2021-10-22 DIAGNOSIS — J9601 Acute respiratory failure with hypoxia: Secondary | ICD-10-CM | POA: Diagnosis present

## 2021-10-22 DIAGNOSIS — R188 Other ascites: Secondary | ICD-10-CM | POA: Diagnosis present

## 2021-10-22 DIAGNOSIS — Z931 Gastrostomy status: Secondary | ICD-10-CM

## 2021-10-22 DIAGNOSIS — G9340 Encephalopathy, unspecified: Secondary | ICD-10-CM | POA: Diagnosis present

## 2021-10-22 DIAGNOSIS — R1084 Generalized abdominal pain: Secondary | ICD-10-CM | POA: Diagnosis present

## 2021-10-22 DIAGNOSIS — E875 Hyperkalemia: Secondary | ICD-10-CM | POA: Diagnosis present

## 2021-10-22 DIAGNOSIS — R6521 Severe sepsis with septic shock: Secondary | ICD-10-CM | POA: Diagnosis present

## 2021-10-22 DIAGNOSIS — E440 Moderate protein-calorie malnutrition: Secondary | ICD-10-CM | POA: Insufficient documentation

## 2021-10-22 DIAGNOSIS — F10931 Alcohol use, unspecified with withdrawal delirium: Secondary | ICD-10-CM | POA: Diagnosis present

## 2021-10-22 DIAGNOSIS — A498 Other bacterial infections of unspecified site: Secondary | ICD-10-CM

## 2021-10-22 DIAGNOSIS — D6959 Other secondary thrombocytopenia: Secondary | ICD-10-CM | POA: Diagnosis not present

## 2021-10-22 DIAGNOSIS — Z720 Tobacco use: Secondary | ICD-10-CM | POA: Diagnosis present

## 2021-10-22 DIAGNOSIS — K632 Fistula of intestine: Secondary | ICD-10-CM | POA: Diagnosis present

## 2021-10-22 DIAGNOSIS — L0291 Cutaneous abscess, unspecified: Secondary | ICD-10-CM

## 2021-10-22 HISTORY — PX: LAPAROTOMY: SHX154

## 2021-10-22 LAB — BASIC METABOLIC PANEL
Anion gap: 11 (ref 5–15)
BUN: 33 mg/dL — ABNORMAL HIGH (ref 6–20)
CO2: 15 mmol/L — ABNORMAL LOW (ref 22–32)
Calcium: 6.9 mg/dL — ABNORMAL LOW (ref 8.9–10.3)
Chloride: 100 mmol/L (ref 98–111)
Creatinine, Ser: 1.24 mg/dL — ABNORMAL HIGH (ref 0.44–1.00)
GFR, Estimated: 50 mL/min — ABNORMAL LOW (ref >60)
Glucose, Bld: 99 mg/dL (ref 70–99)
Potassium: 3.8 mmol/L (ref 3.5–5.1)
Sodium: 126 mmol/L — ABNORMAL LOW (ref 135–145)

## 2021-10-22 LAB — POCT I-STAT 7, (LYTES, BLD GAS, ICA,H+H)
Acid-base deficit: 10 mmol/L — ABNORMAL HIGH (ref 0.0–2.0)
Acid-base deficit: 8 mmol/L — ABNORMAL HIGH (ref 0.0–2.0)
Bicarbonate: 18.6 mmol/L — ABNORMAL LOW (ref 20.0–28.0)
Bicarbonate: 20.7 mmol/L (ref 20.0–28.0)
Calcium, Ion: 1.08 mmol/L — ABNORMAL LOW (ref 1.15–1.40)
Calcium, Ion: 1.09 mmol/L — ABNORMAL LOW (ref 1.15–1.40)
HCT: 31 % — ABNORMAL LOW (ref 36.0–46.0)
HCT: 33 % — ABNORMAL LOW (ref 36.0–46.0)
Hemoglobin: 10.5 g/dL — ABNORMAL LOW (ref 12.0–15.0)
Hemoglobin: 11.2 g/dL — ABNORMAL LOW (ref 12.0–15.0)
O2 Saturation: 100 %
O2 Saturation: 96 %
Patient temperature: 33.5
Patient temperature: 34
Potassium: 2.4 mmol/L — CL (ref 3.5–5.1)
Potassium: 2.5 mmol/L — CL (ref 3.5–5.1)
Sodium: 125 mmol/L — ABNORMAL LOW (ref 135–145)
Sodium: 127 mmol/L — ABNORMAL LOW (ref 135–145)
TCO2: 20 mmol/L — ABNORMAL LOW (ref 22–32)
TCO2: 22 mmol/L (ref 22–32)
pCO2 arterial: 42.8 mmHg (ref 32–48)
pCO2 arterial: 48.4 mmHg — ABNORMAL HIGH (ref 32–48)
pH, Arterial: 7.221 — ABNORMAL LOW (ref 7.35–7.45)
pH, Arterial: 7.227 — ABNORMAL LOW (ref 7.35–7.45)
pO2, Arterial: 328 mmHg — ABNORMAL HIGH (ref 83–108)
pO2, Arterial: 81 mmHg — ABNORMAL LOW (ref 83–108)

## 2021-10-22 LAB — TROPONIN I (HIGH SENSITIVITY)
Troponin I (High Sensitivity): 43 ng/L — ABNORMAL HIGH (ref <18)
Troponin I (High Sensitivity): 30 ng/L — ABNORMAL HIGH (ref <18)

## 2021-10-22 LAB — CBC WITH DIFFERENTIAL/PLATELET
Abs Immature Granulocytes: 0.13 10*3/uL — ABNORMAL HIGH (ref 0.00–0.07)
Basophils Absolute: 0.1 10*3/uL (ref 0.0–0.1)
Basophils Relative: 1 %
Eosinophils Absolute: 0 10*3/uL (ref 0.0–0.5)
Eosinophils Relative: 0 %
HCT: 37.1 % (ref 36.0–46.0)
Hemoglobin: 13 g/dL (ref 12.0–15.0)
Immature Granulocytes: 1 %
Lymphocytes Relative: 6 %
Lymphs Abs: 0.9 10*3/uL (ref 0.7–4.0)
MCH: 32.4 pg (ref 26.0–34.0)
MCHC: 35 g/dL (ref 30.0–36.0)
MCV: 92.5 fL (ref 80.0–100.0)
Monocytes Absolute: 1.4 10*3/uL — ABNORMAL HIGH (ref 0.1–1.0)
Monocytes Relative: 9 %
Neutro Abs: 13.4 10*3/uL — ABNORMAL HIGH (ref 1.7–7.7)
Neutrophils Relative %: 83 %
Platelets: 287 10*3/uL (ref 150–400)
RBC: 4.01 MIL/uL (ref 3.87–5.11)
RDW: 12.5 % (ref 11.5–15.5)
WBC: 15.9 10*3/uL — ABNORMAL HIGH (ref 4.0–10.5)
nRBC: 0 % (ref 0.0–0.2)

## 2021-10-22 LAB — ABO/RH: ABO/RH(D): O POS

## 2021-10-22 LAB — LIPASE, BLOOD: Lipase: 27 U/L (ref 11–51)

## 2021-10-22 LAB — PROTIME-INR
INR: 1.3 — ABNORMAL HIGH (ref 0.8–1.2)
Prothrombin Time: 15.7 seconds — ABNORMAL HIGH (ref 11.4–15.2)

## 2021-10-22 LAB — LACTIC ACID, PLASMA
Lactic Acid, Venous: >9 mmol/L (ref 0.5–1.9)
Lactic Acid, Venous: 3.2 mmol/L (ref 0.5–1.9)
Lactic Acid, Venous: 4.6 mmol/L (ref 0.5–1.9)

## 2021-10-22 LAB — COMPREHENSIVE METABOLIC PANEL
ALT: 14 U/L (ref 0–44)
AST: 43 U/L — ABNORMAL HIGH (ref 15–41)
Albumin: 2.7 g/dL — ABNORMAL LOW (ref 3.5–5.0)
Alkaline Phosphatase: 54 U/L (ref 38–126)
Anion gap: >20 — ABNORMAL HIGH (ref 5–15)
BUN: 31 mg/dL — ABNORMAL HIGH (ref 6–20)
CO2: 15 mmol/L — ABNORMAL LOW (ref 22–32)
Calcium: 8.2 mg/dL — ABNORMAL LOW (ref 8.9–10.3)
Chloride: 87 mmol/L — ABNORMAL LOW (ref 98–111)
Creatinine, Ser: 1.52 mg/dL — ABNORMAL HIGH (ref 0.44–1.00)
GFR, Estimated: 39 mL/min — ABNORMAL LOW (ref >60)
Glucose, Bld: 95 mg/dL (ref 70–99)
Potassium: 3 mmol/L — ABNORMAL LOW (ref 3.5–5.1)
Sodium: 124 mmol/L — ABNORMAL LOW (ref 135–145)
Total Bilirubin: 2 mg/dL — ABNORMAL HIGH (ref 0.3–1.2)
Total Protein: 6 g/dL — ABNORMAL LOW (ref 6.5–8.1)

## 2021-10-22 LAB — ETHANOL: Alcohol, Ethyl (B): <10 mg/dL (ref <10)

## 2021-10-22 LAB — TYPE AND SCREEN
ABO/RH(D): O POS
Antibody Screen: NEGATIVE

## 2021-10-22 SURGERY — LAPAROTOMY, EXPLORATORY
Anesthesia: General | Site: Abdomen

## 2021-10-22 MED ORDER — HYDROMORPHONE HCL 1 MG/ML IJ SOLN
INTRAMUSCULAR | Status: AC
  Filled 2021-10-22: qty 2

## 2021-10-22 MED ORDER — ONDANSETRON HCL 4 MG/2ML IJ SOLN
INTRAMUSCULAR | Status: DC | PRN
  Administered 2021-10-22: 4 mg via INTRAVENOUS

## 2021-10-22 MED ORDER — ONDANSETRON HCL 4 MG/2ML IJ SOLN
4.0000 mg | Freq: Once | INTRAMUSCULAR | Status: AC
Start: 2021-10-22 — End: 2021-10-22
  Administered 2021-10-22: 4 mg via INTRAVENOUS
  Filled 2021-10-22: qty 2

## 2021-10-22 MED ORDER — POLYETHYLENE GLYCOL 3350 17 G PO PACK: 17.0000 g | PACK | Freq: Every day | ORAL | Status: DC | PRN

## 2021-10-22 MED ORDER — SODIUM CHLORIDE 0.9 % IV SOLN
2.0000 g | Freq: Two times a day (BID) | INTRAVENOUS | Status: DC
  Administered 2021-10-23 – 2021-10-24 (×3): 2 g via INTRAVENOUS
  Filled 2021-10-22 (×3): qty 12.5

## 2021-10-22 MED ORDER — ONDANSETRON HCL 4 MG/2ML IJ SOLN: 4.0000 mg | Freq: Four times a day (QID) | INTRAMUSCULAR | Status: DC | PRN

## 2021-10-22 MED ORDER — VANCOMYCIN VARIABLE DOSE PER UNSTABLE RENAL FUNCTION (PHARMACIST DOSING): Status: DC

## 2021-10-22 MED ORDER — METRONIDAZOLE 500 MG/100ML IV SOLN
500.0000 mg | Freq: Two times a day (BID) | INTRAVENOUS | Status: DC
  Administered 2021-10-22 – 2021-10-24 (×5): 500 mg via INTRAVENOUS
  Filled 2021-10-22 (×5): qty 100

## 2021-10-22 MED ORDER — DIAZEPAM 5 MG/ML IJ SOLN
5.0000 mg | Freq: Four times a day (QID) | INTRAMUSCULAR | Status: DC
  Administered 2021-10-23 – 2021-10-24 (×2): 5 mg via INTRAVENOUS
  Filled 2021-10-22 (×3): qty 2

## 2021-10-22 MED ORDER — SODIUM CHLORIDE 0.9 % IV BOLUS
1000.0000 mL | Freq: Once | INTRAVENOUS | Status: AC
  Administered 2021-10-22: 1000 mL via INTRAVENOUS

## 2021-10-22 MED ORDER — OXYCODONE HCL 5 MG PO TABS: 5.0000 mg | ORAL_TABLET | Freq: Once | ORAL | Status: DC | PRN

## 2021-10-22 MED ORDER — LORAZEPAM 1 MG PO TABS: 1.0000 mg | ORAL_TABLET | ORAL | Status: DC | PRN

## 2021-10-22 MED ORDER — DEXAMETHASONE SODIUM PHOSPHATE 10 MG/ML IJ SOLN
INTRAMUSCULAR | Status: DC | PRN
  Administered 2021-10-22: 5 mg via INTRAVENOUS

## 2021-10-22 MED ORDER — LACTATED RINGERS IV SOLN: INTRAVENOUS | Status: DC | PRN

## 2021-10-22 MED ORDER — POTASSIUM CHLORIDE 10 MEQ/100ML IV SOLN
10.0000 meq | Freq: Once | INTRAVENOUS | Status: AC
  Administered 2021-10-22: 10 meq via INTRAVENOUS

## 2021-10-22 MED ORDER — FENTANYL CITRATE (PF) 250 MCG/5ML IJ SOLN
INTRAMUSCULAR | Status: DC | PRN
Start: 2021-10-22 — End: 2021-10-22
  Administered 2021-10-22 (×3): 50 ug via INTRAVENOUS

## 2021-10-22 MED ORDER — MIDAZOLAM HCL 2 MG/2ML IJ SOLN
INTRAMUSCULAR | Status: DC | PRN
  Administered 2021-10-22 (×2): 1 mg via INTRAVENOUS

## 2021-10-22 MED ORDER — THIAMINE HCL 100 MG PO TABS
100.0000 mg | ORAL_TABLET | Freq: Every day | ORAL | Status: AC
  Administered 2021-10-26: 100 mg via ORAL
  Filled 2021-10-22: qty 1

## 2021-10-22 MED ORDER — VANCOMYCIN HCL IN DEXTROSE 1-5 GM/200ML-% IV SOLN
1000.0000 mg | Freq: Once | INTRAVENOUS | Status: AC
  Administered 2021-10-22: 1000 mg via INTRAVENOUS
  Filled 2021-10-22: qty 200

## 2021-10-22 MED ORDER — PHENYLEPHRINE HCL (PRESSORS) 10 MG/ML IV SOLN
INTRAVENOUS | Status: AC
  Filled 2021-10-22: qty 1

## 2021-10-22 MED ORDER — SODIUM CHLORIDE 0.9 % IR SOLN
Status: DC | PRN
  Administered 2021-10-22: 7000 mL

## 2021-10-22 MED ORDER — PHENYLEPHRINE HCL-NACL 20-0.9 MG/250ML-% IV SOLN
INTRAVENOUS | Status: DC | PRN
  Administered 2021-10-22: 50 ug/min via INTRAVENOUS

## 2021-10-22 MED ORDER — DIPHENHYDRAMINE HCL 50 MG/ML IJ SOLN: 12.5000 mg | Freq: Four times a day (QID) | INTRAMUSCULAR | Status: DC | PRN

## 2021-10-22 MED ORDER — EPHEDRINE SULFATE-NACL 50-0.9 MG/10ML-% IV SOSY
PREFILLED_SYRINGE | INTRAVENOUS | Status: DC | PRN
  Administered 2021-10-22: 15 mg via INTRAVENOUS

## 2021-10-22 MED ORDER — SODIUM CHLORIDE 0.9% FLUSH: 9.0000 mL | INTRAVENOUS | Status: DC | PRN

## 2021-10-22 MED ORDER — ONDANSETRON HCL 4 MG/2ML IJ SOLN: 4.0000 mg | Freq: Once | INTRAMUSCULAR | Status: DC | PRN

## 2021-10-22 MED ORDER — DOCUSATE SODIUM 50 MG/5ML PO LIQD: 100.0000 mg | Freq: Two times a day (BID) | ORAL | Status: DC | PRN

## 2021-10-22 MED ORDER — SUCCINYLCHOLINE CHLORIDE 200 MG/10ML IV SOSY
PREFILLED_SYRINGE | INTRAVENOUS | Status: DC | PRN
  Administered 2021-10-22: 80 mg via INTRAVENOUS

## 2021-10-22 MED ORDER — OXYCODONE HCL 5 MG/5ML PO SOLN: 5.0000 mg | Freq: Once | ORAL | Status: DC | PRN

## 2021-10-22 MED ORDER — LACTATED RINGERS IV BOLUS: 1000.0000 mL | Freq: Once | INTRAVENOUS | Status: DC

## 2021-10-22 MED ORDER — THIAMINE HCL 100 MG/ML IJ SOLN
100.0000 mg | Freq: Every day | INTRAMUSCULAR | Status: AC
  Administered 2021-10-22 – 2021-10-25 (×4): 100 mg via INTRAVENOUS
  Filled 2021-10-22 (×4): qty 2

## 2021-10-22 MED ORDER — LORAZEPAM 2 MG/ML IJ SOLN
1.0000 mg | INTRAMUSCULAR | Status: DC | PRN
  Administered 2021-10-23: 2 mg via INTRAVENOUS
  Filled 2021-10-22: qty 1

## 2021-10-22 MED ORDER — DOCUSATE SODIUM 100 MG PO CAPS: 100.0000 mg | ORAL_CAPSULE | Freq: Two times a day (BID) | ORAL | Status: DC | PRN

## 2021-10-22 MED ORDER — ROCURONIUM BROMIDE 10 MG/ML (PF) SYRINGE
PREFILLED_SYRINGE | INTRAVENOUS | Status: DC | PRN
  Administered 2021-10-22: 50 mg via INTRAVENOUS

## 2021-10-22 MED ORDER — ALBUMIN HUMAN 5 % IV SOLN: INTRAVENOUS | Status: DC | PRN

## 2021-10-22 MED ORDER — MORPHINE SULFATE 1 MG/ML IV SOLN PCA
INTRAVENOUS | Status: DC
  Administered 2021-10-22: 9 mg via INTRAVENOUS
  Filled 2021-10-22: qty 30

## 2021-10-22 MED ORDER — POTASSIUM CHLORIDE 10 MEQ/100ML IV SOLN: 10.0000 meq | INTRAVENOUS | Status: DC

## 2021-10-22 MED ORDER — FENTANYL CITRATE (PF) 250 MCG/5ML IJ SOLN
INTRAMUSCULAR | Status: AC
  Filled 2021-10-22: qty 5

## 2021-10-22 MED ORDER — MIDAZOLAM HCL 2 MG/2ML IJ SOLN
INTRAMUSCULAR | Status: AC
  Filled 2021-10-22: qty 2

## 2021-10-22 MED ORDER — AMISULPRIDE (ANTIEMETIC) 5 MG/2ML IV SOLN
INTRAVENOUS | Status: AC
  Filled 2021-10-22: qty 4

## 2021-10-22 MED ORDER — POTASSIUM CHLORIDE 10 MEQ/100ML IV SOLN
10.0000 meq | INTRAVENOUS | Status: AC
  Administered 2021-10-22 (×3): 10 meq via INTRAVENOUS
  Filled 2021-10-22 (×3): qty 100

## 2021-10-22 MED ORDER — ADULT MULTIVITAMIN W/MINERALS CH
1.0000 | ORAL_TABLET | Freq: Every day | ORAL | Status: DC
  Filled 2021-10-22: qty 1

## 2021-10-22 MED ORDER — POTASSIUM CHLORIDE 10 MEQ/100ML IV SOLN
10.0000 meq | INTRAVENOUS | Status: DC
  Administered 2021-10-22 (×3): 10 meq via INTRAVENOUS
  Filled 2021-10-22 (×4): qty 100

## 2021-10-22 MED ORDER — DIPHENHYDRAMINE HCL 12.5 MG/5ML PO ELIX: 12.5000 mg | ORAL_SOLUTION | Freq: Four times a day (QID) | ORAL | Status: DC | PRN

## 2021-10-22 MED ORDER — PHENYLEPHRINE 40 MCG/ML (10ML) SYRINGE FOR IV PUSH (FOR BLOOD PRESSURE SUPPORT)
PREFILLED_SYRINGE | INTRAVENOUS | Status: DC | PRN
  Administered 2021-10-22: 120 ug via INTRAVENOUS
  Administered 2021-10-22: 200 ug via INTRAVENOUS
  Administered 2021-10-22: 80 ug via INTRAVENOUS

## 2021-10-22 MED ORDER — CEFOXITIN SODIUM 2 G IV SOLR
2.0000 g | Freq: Three times a day (TID) | INTRAVENOUS | Status: DC
  Filled 2021-10-22 (×2): qty 2

## 2021-10-22 MED ORDER — AMISULPRIDE (ANTIEMETIC) 5 MG/2ML IV SOLN: 10.0000 mg | Freq: Once | INTRAVENOUS | Status: DC | PRN

## 2021-10-22 MED ORDER — MORPHINE SULFATE (PF) 4 MG/ML IV SOLN
4.0000 mg | Freq: Once | INTRAVENOUS | Status: AC
  Administered 2021-10-22: 4 mg via INTRAVENOUS
  Filled 2021-10-22: qty 1

## 2021-10-22 MED ORDER — PROPOFOL 10 MG/ML IV BOLUS
INTRAVENOUS | Status: DC | PRN
  Administered 2021-10-22: 100 mg via INTRAVENOUS

## 2021-10-22 MED ORDER — SUGAMMADEX SODIUM 200 MG/2ML IV SOLN
INTRAVENOUS | Status: DC | PRN
  Administered 2021-10-22: 250 mg via INTRAVENOUS

## 2021-10-22 MED ORDER — FOLIC ACID 1 MG PO TABS: 1.0000 mg | ORAL_TABLET | Freq: Every day | ORAL | Status: DC

## 2021-10-22 MED ORDER — NICOTINE 14 MG/24HR TD PT24
14.0000 mg | MEDICATED_PATCH | Freq: Every day | TRANSDERMAL | Status: DC
  Administered 2021-10-22 – 2021-12-06 (×46): 14 mg via TRANSDERMAL
  Filled 2021-10-22 (×46): qty 1

## 2021-10-22 MED ORDER — ALBUMIN HUMAN 5 % IV SOLN
12.5000 g | Freq: Once | INTRAVENOUS | Status: AC
Start: 2021-10-22 — End: 2021-10-22
  Administered 2021-10-22: 12.5 g via INTRAVENOUS

## 2021-10-22 MED ORDER — ALBUMIN HUMAN 5 % IV SOLN
INTRAVENOUS | Status: AC
  Filled 2021-10-22: qty 250

## 2021-10-22 MED ORDER — PHENYLEPHRINE HCL-NACL 20-0.9 MG/250ML-% IV SOLN
25.0000 ug/min | INTRAVENOUS | Status: DC
  Administered 2021-10-22: 170 ug/min via INTRAVENOUS
  Administered 2021-10-22: 100 ug/min via INTRAVENOUS
  Administered 2021-10-23: 150 ug/min via INTRAVENOUS
  Administered 2021-10-23 (×6): 200 ug/min via INTRAVENOUS
  Administered 2021-10-23: 170 ug/min via INTRAVENOUS
  Filled 2021-10-22: qty 250
  Filled 2021-10-22: qty 500
  Filled 2021-10-22 (×7): qty 250

## 2021-10-22 MED ORDER — SODIUM CHLORIDE 0.9 % IV SOLN: INTRAVENOUS | Status: DC | PRN

## 2021-10-22 MED ORDER — HYDROMORPHONE HCL 1 MG/ML IJ SOLN: 0.2500 mg | INTRAMUSCULAR | Status: DC | PRN

## 2021-10-22 MED ORDER — LIDOCAINE 2% (20 MG/ML) 5 ML SYRINGE
INTRAMUSCULAR | Status: DC | PRN
  Administered 2021-10-22: 60 mg via INTRAVENOUS

## 2021-10-22 MED ORDER — NALOXONE HCL 0.4 MG/ML IJ SOLN: 0.4000 mg | INTRAMUSCULAR | Status: DC | PRN

## 2021-10-22 MED ORDER — THIAMINE HCL 100 MG/ML IJ SOLN: 100.0000 mg | Freq: Every day | INTRAMUSCULAR | Status: DC

## 2021-10-22 MED ORDER — PROPOFOL 10 MG/ML IV BOLUS
INTRAVENOUS | Status: AC
  Filled 2021-10-22: qty 20

## 2021-10-22 MED ORDER — LACTATED RINGERS IV BOLUS
500.0000 mL | Freq: Once | INTRAVENOUS | Status: AC
  Administered 2021-10-22: 500 mL via INTRAVENOUS

## 2021-10-22 MED ORDER — SODIUM CHLORIDE 0.9 % IV SOLN
250.0000 mL | INTRAVENOUS | Status: DC
  Administered 2021-10-23: 250 mL via INTRAVENOUS

## 2021-10-22 MED ORDER — SODIUM CHLORIDE 0.9 % IV SOLN
2.0000 g | Freq: Once | INTRAVENOUS | Status: AC
  Administered 2021-10-22: 2 g via INTRAVENOUS
  Filled 2021-10-22: qty 12.5

## 2021-10-22 SURGICAL SUPPLY — 58 items
APL PRP STRL LF DISP 70% ISPRP (MISCELLANEOUS) ×1
BAG COUNTER SPONGE SURGICOUNT (BAG) IMPLANT
BAG SPNG CNTER NS LX DISP (BAG)
BINDER ABDOMINAL 12 ML 46-62 (SOFTGOODS) ×1 IMPLANT
BLADE EXTENDED COATED 6.5IN (ELECTRODE) ×1 IMPLANT
BRR ADH 6X5 SEPRAFILM 1 SHT (MISCELLANEOUS) ×1
CATH GASTROSTOMY 20FR (CATHETERS) ×1 IMPLANT
CELLS DAT CNTRL 66122 CELL SVR (MISCELLANEOUS) IMPLANT
CHLORAPREP W/TINT 26 (MISCELLANEOUS) ×2 IMPLANT
DRAIN CHANNEL 19F RND (DRAIN) ×1 IMPLANT
DRAPE LAPAROSCOPIC ABDOMINAL (DRAPES) ×2 IMPLANT
DRAPE SHEET LG 3/4 BI-LAMINATE (DRAPES) IMPLANT
DRSG OPSITE POSTOP 4X10 (GAUZE/BANDAGES/DRESSINGS) IMPLANT
DRSG OPSITE POSTOP 4X6 (GAUZE/BANDAGES/DRESSINGS) IMPLANT
DRSG OPSITE POSTOP 4X8 (GAUZE/BANDAGES/DRESSINGS) IMPLANT
ELECT REM PT RETURN 15FT ADLT (MISCELLANEOUS) ×2 IMPLANT
EVACUATOR SILICONE 100CC (DRAIN) ×1 IMPLANT
GAUZE SPONGE 4X4 12PLY STRL (GAUZE/BANDAGES/DRESSINGS) ×1 IMPLANT
GLOVE BIO SURGEON STRL SZ 6.5 (GLOVE) ×4 IMPLANT
GLOVE BIOGEL PI IND STRL 7.0 (GLOVE) ×2 IMPLANT
GLOVE BIOGEL PI INDICATOR 7.0 (GLOVE) ×2
GLOVE INDICATOR 6.5 STRL GRN (GLOVE) ×2 IMPLANT
GOWN STRL REUS W/ TWL XL LVL3 (GOWN DISPOSABLE) ×3 IMPLANT
GOWN STRL REUS W/TWL XL LVL3 (GOWN DISPOSABLE) ×6
HANDLE SUCTION POOLE (INSTRUMENTS) IMPLANT
KIT TURNOVER KIT A (KITS) IMPLANT
LEGGING LITHOTOMY PAIR STRL (DRAPES) IMPLANT
LIGASURE IMPACT 36 18CM CVD LR (INSTRUMENTS) ×1 IMPLANT
PACK COLON (CUSTOM PROCEDURE TRAY) ×1 IMPLANT
PENCIL SMOKE EVACUATOR (MISCELLANEOUS) ×1 IMPLANT
RELOAD GRN CONTOUR (ENDOMECHANICALS) ×2 IMPLANT
RELOAD STAPLE 40 GRN THCK (ENDOMECHANICALS) IMPLANT
RETRACTOR WND ALEXIS 18 MED (MISCELLANEOUS) IMPLANT
RETRACTOR WND ALEXIS 25 LRG (MISCELLANEOUS) IMPLANT
RTRCTR WOUND ALEXIS 18CM MED (MISCELLANEOUS)
RTRCTR WOUND ALEXIS 25CM LRG (MISCELLANEOUS) ×2
SEPRAFILM MEMBRANE 5X6 (MISCELLANEOUS) ×1 IMPLANT
STAPLER CVD CUT GN 40 RELOAD (ENDOMECHANICALS) ×2 IMPLANT
STAPLER CVD CUT GRN 40 RELOAD (ENDOMECHANICALS) IMPLANT
STAPLER VISISTAT 35W (STAPLE) ×2 IMPLANT
SUCTION POOLE HANDLE (INSTRUMENTS) ×4
SUT ETHILON 3 0 PS 1 (SUTURE) ×1 IMPLANT
SUT NOVA 1 T20/GS 25DT (SUTURE) ×2 IMPLANT
SUT PDS AB 1 CT1 27 (SUTURE) ×2 IMPLANT
SUT PDS AB 1 CTX 36 (SUTURE) IMPLANT
SUT PROLENE 2 0 SH DA (SUTURE) ×1 IMPLANT
SUT SILK 2 0 (SUTURE) ×2
SUT SILK 2 0 SH CR/8 (SUTURE) ×2 IMPLANT
SUT SILK 2-0 18XBRD TIE 12 (SUTURE) ×1 IMPLANT
SUT SILK 3 0 (SUTURE) ×2
SUT SILK 3 0 SH CR/8 (SUTURE) ×2 IMPLANT
SUT SILK 3-0 18XBRD TIE 12 (SUTURE) ×1 IMPLANT
SUT VIC AB 2-0 SH 18 (SUTURE) ×4 IMPLANT
TAPE CLOTH 4X10 WHT NS (GAUZE/BANDAGES/DRESSINGS) ×1 IMPLANT
TOWEL OR 17X26 10 PK STRL BLUE (TOWEL DISPOSABLE) ×1 IMPLANT
TOWEL OR NON WOVEN STRL DISP B (DISPOSABLE) ×2 IMPLANT
TRAY FOLEY MTR SLVR 16FR STAT (SET/KITS/TRAYS/PACK) ×2 IMPLANT
TUBING CONNECTING 10 (TUBING) ×4 IMPLANT

## 2021-10-22 NOTE — Progress Notes (Signed)
Pharmacy Antibiotic Note ? ?Jessica [name] is a 61 y.o. female admitted on 10/22/2021 with septic shock secondary to feculent peritonitis with sigmoid perforation.  Pharmacy has been consulted for vancomycin dosing and to switch cefoxitin to cefepime per discussion with CCM provider. ? ?Scr elevated to 1.52 (baseline < 1), estimated CrCl ~ 33 mL/min.  LA elevated >9.   ? ?Plan: ?Check vancomycin random level 24 hours after vancomycin dose given ?Re-dose vancomycin when vanc random < 20 ?Cefepime 2g IV q12 hours ? ?Height: '5\' 11"'$  (180.3 cm) ?Weight: 54 kg (119 lb) ?IBW/kg (Calculated) : 70.8 ? ?Temp (24hrs), Avg:96.4 ?F (35.8 ?C), Min:94.9 ?F (34.9 ?C), Max:97.6 ?F (36.4 ?C) ? ?Recent Labs  ?Lab 10/22/21 ?1141  ?WBC 15.9*  ?CREATININE 1.52*  ?LATICACIDVEN >9.0*  ?  ?Estimated Creatinine Clearance: 33.6 mL/min (A) (by C-G formula based on SCr of 1.52 mg/dL (H)).   ? ?No Known Allergies ? ?Antimicrobials this admission: ?Vancomycin 4/15 >>  ?Flagyl 4/15 >>  ?Cefepime 4/15 >>  ? ?Dose adjustments this admission: ? ? ?Microbiology results: ?4/15 BCx:  ? ?Thank you for allowing pharmacy to be a part of this patient?s care. ? ?Dimple Nanas, PharmD ?10/22/2021 7:55 PM ? ?

## 2021-10-22 NOTE — Op Note (Signed)
10/22/2021 ? ?3:56 PM ? ?PATIENT:  Jessica [name]  61 y.o. female ? ?Patient Care Team: ?Deland Pretty, MD as PCP - General (Internal Medicine) ? ?PRE-OPERATIVE DIAGNOSIS:  PERFORATED VISCUS, MALNUTRITION ? ?POST-OPERATIVE DIAGNOSIS:  PERFORATED SIGMOID COLON, MALNUTRITION ? ?PROCEDURE:   ?EXPLORATORY LAPAROTOMY, HARTMAN'S PROCEDURE, INSERTION OF FEEDING G TUBE ? ? Surgeon(s): ?Leighton Ruff, MD ? ?ASSISTANT: none  ? ?ANESTHESIA:   general ? ?EBL:  50 ml Total I/O ?In: 6789 [I.V.:3100; IV Piggyback:350] ?Out: 200 [Urine:150; Blood:50] ? ?DRAINS: (24F) Jackson-Pratt drain(s) with closed bulb suction in the pelvis   ? ?SPECIMEN:  Source of Specimen:  Perforated sigmoid colon ? ?DISPOSITION OF SPECIMEN:  PATHOLOGY ? ?COUNTS:  YES ? ?PLAN OF CARE: Admit to inpatient  ? ?PATIENT DISPOSITION:  PACU - hemodynamically stable. ? ?INDICATION: 61 y.o. F with 2 weeks of abd that significantly worsened last night ? ? ?OR FINDINGS: Purulent ascites throughout the abdomen and stool contamination in the pelvis due to necrotic perforated sigmoid colon ? ?DESCRIPTION: the patient was identified in the preoperative holding area and taken to the OR where they were laid supine on the operating room table.  General anesthesia was induced without difficulty. SCDs were also noted to be in place prior to the initiation of anesthesia.  The patient was then prepped and draped in the usual sterile fashion. ?  A surgical timeout was performed indicating the correct patient, procedure, positioning and need for preoperative antibiotics.  ? ?Upon entering the abdomen (organ space), I encountered purulent ascites within the abdomen and feculent peritonitis in the pelvis with frank stool noted within the pelvis.  The sigmoid colon was edematous and partially necrotic with a large open wound draining stool.  I quickly evaluated the rest of the abdomen.  There was no sign of gastric inflammation or duodenal ulcer.  The gallbladder appeared normal.   The transverse colon and descending colon appeared normal.  The cecum appeared secondarily inflamed.  There were several small bowel loops with fibrinous material.  I ran the entire small bowel and there were no other injuries noted.  I began by mobilizing the sigmoid colon off the left pelvic sidewall.  I opened up the white line of Toldt approximately halfway up to allow for mobilization of the sigmoid to the abdominal wall.  I identified the left ureter in its normal anatomic position.  Once this was identified I mobilized the rest of the lateral attachments down to the pelvis.  This allowed me to free the loop of sigmoid colon out of the pelvis.  I transected this proximally using a green load contour stapler.  I then used a LigaSure device to divide the mesentery down to the rectosigmoid junction staying close to the bowel as I went.  I then divided the proximal rectum using a green load contour stapler.  The specimen was sent to pathology for further examination.  No obvious masses were palpated within the colon.  At this point we irrigated the entire abdomen with warm normal saline, approximately 6 L in total.  The stool was washed out of the pelvis as best we could.  There was no active bleeding.  We attempted to place an NG tube from above but despite multiple attempts by multiple operators we were unable to place this.  Due to the patient's malnutrition and inability to place an NG tube for decompression I decided to place a operative G-tube.  Identified a portion of the stomach that would easily reach to the  abdominal wall.  3, 2-0 silk stay sutures were placed in the abdominal wall and tied to the gastric wall to pexied the stomach.  I then made a small enterotomy in the stomach using electrocautery as well as an incision in the abdominal wall.  I placed a G-tube into the abdominal wall incision and then into the stomach.  The balloon was insufflated with approximately 10 mL of saline.  This was then  tightened to the abdominal wall and secured into place with 2-0 nylon sutures. ? ?We then turned our attention back to the pelvis.  This was irrigated once again.  The rectal staple line was examined.  Hemostasis was good.  This appeared intact.  This was marked using 2, 2-0 sutures on either end of the staple line.  After this we placed a 22 [place] Blake drain into the pelvis due to the massive stool contamination.  This was brought out through the right lower quadrant abdominal wall and sutured into place with a 2-0 nylon suture.  I then made a defect in the left lower quadrant abdominal wall using electrocautery.  Dissection was carried down to the fascia just above the rectus muscle.  A cruciate incision was made in the fascia and the rectus muscle was split.  The peritoneum was divided.  This was then gently dilated to approximately 2 fingerbreadths.  The proximal colon and was brought out through the defect and secured into place.  Seprafilm was placed into the pelvis and along the top of the small bowel to help prevent adhesions.  The fascia was then closed using 2 running 0 PDS sutures.  The skin was left open and packed.  The ostomy was matured in standard [place] fashion using interrupted 2-0 Vicryl sutures.  Multiple diverticuli were noted throughout the colon at the ostomy site.  The patient was then awakened from anesthesia and sent to the postanesthesia care unit in stable condition.  All counts were correct per operating room staff. ? ?Rosario Adie, MD ? ?Colorectal and General Surgery ?[facility]  ?  ?

## 2021-10-22 NOTE — Anesthesia Postprocedure Evaluation (Signed)
Anesthesia Post Note ? ?Patient: Jessica [name] ? ?Procedure(s) Performed: EXPLORATORY LAPAROTOMY hartmans (Abdomen) ? ?  ? ?Patient location during evaluation: PACU ?Anesthesia Type: General ?Level of consciousness: awake and alert ?Pain management: pain level controlled ?Vital Signs Assessment: post-procedure vital signs reviewed and stable ?Respiratory status: spontaneous breathing, nonlabored ventilation and respiratory function stable ?Cardiovascular status: blood pressure returned to baseline and stable ?Postop Assessment: no apparent nausea or vomiting ?Anesthetic complications: no ? ? ?No notable events documented. ? ?Last Vitals:  ?Vitals:  ? 10/22/21 1717 10/22/21 1730  ?BP: (!) 87/65 (!) 93/31  ?Pulse: (!) 104 (!) 103  ?Resp: (!) 29 (!) 26  ?Temp: (!) 35.9 ?C   ?SpO2: 95% 94%  ?  ?Last Pain:  ?Vitals:  ? 10/22/21 1745  ?TempSrc:   ?PainSc: 0-No pain  ? ? ?  ?  ?  ?  ?  ?  ? ?Lidia Collum ? ? ? ? ?

## 2021-10-22 NOTE — Progress Notes (Signed)
Notified bedside nurse of need to draw repeat lactic acid.  ? ?Pt has gone to the OR during sepsis protocol time frame. Patient is now in the pacu and I have sent a message to pacu RN asking if she can draw the repeat lactic.  ?

## 2021-10-22 NOTE — Progress Notes (Signed)
A consult was received from an ED physician for vancomycin and cefepime per pharmacy dosing.  The patient's profile has been reviewed for ht/wt/allergies/indication/available labs.   ?A one time order has been placed for vancomycin '1000mg'$  IV x1 and cefepime 2g IV x1.  ? ?Further antibiotics/pharmacy consults should be ordered by admitting physician if indicated.       ?                ?Thank you, ? ?Dimple Nanas, PharmD ?10/22/2021 12:25 PM ? ?

## 2021-10-22 NOTE — Progress Notes (Signed)
?   10/22/21 2055  ?Vitals  ?Temp (!) 97.3 ?F (36.3 ?C)  ?Temp Source Oral  ? ?Was able to obtain oral temp.  Pt denies being cold - states she is hot.  Will defer replacement of temp foley at this time ?

## 2021-10-22 NOTE — Anesthesia Procedure Notes (Signed)
Date/Time: 10/22/2021 4:22 PM ?Performed by: Cynda Familia, CRNA ?Oxygen Delivery Method: Simple face mask ?Placement Confirmation: positive ETCO2 and breath sounds checked- equal and bilateral ?Dental Injury: Teeth and Oropharynx as per pre-operative assessment  ? ? ? ? ?

## 2021-10-22 NOTE — ED Provider Notes (Signed)
?[facility] DEPT ?Provider Note ? ? ?CSN: [id number] ?Arrival date & time: 10/22/21  1044 ? ?  ? ?History ? ?Chief Complaint  ?Patient presents with  ? Abdominal Pain  ? Weakness  ? Constipation  ? ? ?Jessica [name] is a 61 y.o. female. ? ?61 year old female with prior medical history as detailed below presents for evaluation.  Patient is complaining of significant diffuse abdominal discomfort that started around midnight last night.  She reports that she has been constipated for 4 days.  Patient reports frequent alcohol use.  Her last alcohol intake was yesterday. ? ?She denies fever.  She reports nausea and vomiting related to her abdominal pain. ? ? ? ?The history is provided by the patient and medical records.  ?Abdominal Pain ?Pain location:  Generalized ?Pain quality: aching   ?Pain radiates to:  Does not radiate ?Pain severity:  Severe ?Onset quality:  Gradual ?Duration:  12 hours ?Timing:  Constant ?Progression:  Worsening ?Associated symptoms: constipation   ?Weakness ?Associated symptoms: abdominal pain   ?Constipation ?Associated symptoms: abdominal pain   ? ?  ? ?Home Medications ?Prior to Admission medications   ?Medication Sig Start Date End Date Taking? Authorizing Provider  ?cetirizine (ZYRTEC) 10 MG tablet Take 10 mg by mouth daily as needed for allergies.    [provider]  ?diclofenac Sodium (VOLTAREN) 1 % GEL Apply 4 g topically 4 (four) times daily. 09/21/21   Deno Etienne, DO  ?famotidine (PEPCID) 40 MG tablet Take 40 mg by mouth at bedtime. 10/10/21   [provider]  ?fluticasone (FLONASE) 50 MCG/ACT nasal spray Place 1-2 sprays into both nostrils daily as needed for allergies or rhinitis.    [provider]  ?lisinopril-hydrochlorothiazide (ZESTORETIC) 10-12.5 MG tablet Take 1 tablet by mouth daily. 09/26/21   [provider]  ?loratadine (CLARITIN) 10 MG tablet Take 10 mg by mouth daily as needed for allergies.    [provider]  ?mirtazapine (REMERON SOL-TAB) 15 MG disintegrating tablet Take 1 tablet (15 mg total) by mouth at bedtime. 12/12/17 01/11/18  Arrien, Jimmy Picket, MD  ?morphine (MSIR) 15 MG tablet Take 0.5 tablets (7.5 mg total) by mouth every 4 (four) hours as needed for severe pain. 09/26/21   Vanetta Mulders, MD  ?ondansetron (ZOFRAN) 8 MG tablet Take 8 mg by mouth daily as needed for nausea/vomiting. 10/10/21   [provider]  ?   ? ?Allergies    ?Patient has no known allergies.   ? ?Review of Systems   ?Review of Systems  ?Gastrointestinal:  Positive for abdominal pain and constipation.  ?Neurological:  Positive for weakness.  ? ?Physical Exam ?Updated Vital Signs ?BP (!) 107/94   Pulse (!) 110   Temp (!) 94.9 ?F (34.9 ?C) (Rectal)   Resp 20   Ht '5\' 11"'$  (1.803 m)   Wt 54 kg   SpO2 97%   BMI 16.60 kg/m?  ?Physical Exam ?Vitals and nursing note reviewed.  ?Constitutional:   ?   General: She is in acute distress.  ?   Appearance: Normal appearance.  ?HENT:  ?   Head: Normocephalic and atraumatic.  ?Eyes:  ?   Conjunctiva/sclera: Conjunctivae normal.  ?   Pupils: Pupils are equal, round, and reactive to light.  ?Cardiovascular:  ?   Rate and Rhythm: Normal rate and regular rhythm.  ?   Heart sounds: Normal heart sounds.  ?Pulmonary:  ?   Effort: Pulmonary effort is normal. No respiratory  distress.  ?   Breath sounds: Normal breath sounds.  ?Abdominal:  ?   General: There is distension.  ?   Palpations: Abdomen is soft.  ?   Tenderness: There is generalized abdominal tenderness.  ?Musculoskeletal:     ?   General: No deformity. Normal range of motion.  ?   Cervical back: Normal range of motion and neck supple.  ?Skin: ?   General: Skin is warm and dry.  ?Neurological:  ?   General: No focal deficit present.  ?   Mental Status: She is alert and oriented to person, place, and time.  ? ? ?ED Results / Procedures / Treatments   ?Labs ?(all labs ordered are listed, but only abnormal results are  displayed) ?Labs Reviewed  ?COMPREHENSIVE METABOLIC PANEL - Abnormal; Notable for the following components:  ?    Result Value  ? Sodium 124 (*)   ? Potassium 3.0 (*)   ? Chloride 87 (*)   ? CO2 15 (*)   ? BUN 31 (*)   ? Creatinine, Ser 1.52 (*)   ? Calcium 8.2 (*)   ? Total Protein 6.0 (*)   ? Albumin 2.7 (*)   ? AST 43 (*)   ? Total Bilirubin 2.0 (*)   ? GFR, Estimated 39 (*)   ? Anion gap >20 (*)   ? All other components within normal limits  ?LACTIC ACID, PLASMA - Abnormal; Notable for the following components:  ? Lactic Acid, Venous >9.0 (*)   ? All other components within normal limits  ?CBC WITH DIFFERENTIAL/PLATELET - Abnormal; Notable for the following components:  ? WBC 15.9 (*)   ? Neutro Abs 13.4 (*)   ? Monocytes Absolute 1.4 (*)   ? Abs Immature Granulocytes 0.13 (*)   ? All other components within normal limits  ?TROPONIN I (HIGH SENSITIVITY) - Abnormal; Notable for the following components:  ? Troponin I (High Sensitivity) 43 (*)   ? All other components within normal limits  ?CULTURE, BLOOD (ROUTINE X 2)  ?CULTURE, BLOOD (ROUTINE X 2)  ?ETHANOL  ?LIPASE, BLOOD  ?URINALYSIS, ROUTINE W REFLEX MICROSCOPIC  ?RAPID URINE DRUG SCREEN, HOSP PERFORMED  ?LACTIC ACID, PLASMA  ?PROTIME-INR  ?TYPE AND SCREEN  ?ABO/RH  ?TROPONIN I (HIGH SENSITIVITY)  ? ? ?EKG ?None ? ?Radiology ?CT ABDOMEN PELVIS WO CONTRAST ? ?Result Date: 10/22/2021 ?CLINICAL DATA:  Abdominal pain, acute, nonlocalized. Constipation for 4 days. EXAM: CT ABDOMEN AND PELVIS WITHOUT CONTRAST TECHNIQUE: Multidetector CT imaging of the abdomen and pelvis was performed following the standard protocol without IV contrast. RADIATION DOSE REDUCTION: This exam was performed according to the departmental dose-optimization program which includes automated exposure control, adjustment of the mA and/or kV according to patient size and/or use of iterative reconstruction technique. COMPARISON:  None. FINDINGS: Lower chest: The lung bases are clear without focal  nodule, mass, or airspace disease. Heart size is normal. No significant pleural or pericardial effusion is present. Hepatobiliary: 6 mm gallstone is present. Common bile duct and gallbladder are otherwise within normal limits. Liver is unremarkable. Pancreas: Unremarkable. No pancreatic ductal dilatation or surrounding inflammatory changes. Spleen: Normal in size without focal abnormality. Adrenals/Urinary Tract: Adrenal glands are unremarkable. Kidneys are normal, without renal calculi, focal lesion, or hydronephrosis. Bladder is unremarkable. Stomach/Bowel: Stomach is distended scratched at the stomach and duodenum are distended. Proximal small bowel loops are distended up to 4.4 cm. Distal small bowel is collapsed. Transition point is not discretely identified. Thickened loops of small bowel  are present in the left lower quadrant. No definite pneumoperitoneum. The ascending and transverse colon contains stool. Moderate stool is present the distal sigmoid colon and rectum. Vascular/Lymphatic: Atherosclerotic calcifications are present the aorta branch vessels without aneurysm. Reproductive: Uterus and bilateral adnexa are unremarkable. Other: Free fluid is present in the anatomic pelvis. Diffuse pneumoperitoneum noted. There is some fluid surrounding the liver. No definite abscess. Musculoskeletal: L2 superior endplate compression fracture appears remote. Chronic endplate sclerotic changes present at L3-4. No other focal osseous lesions are present. Bony pelvis within normal limits. Hips are located and normal. IMPRESSION: 1. Diffuse pneumoperitoneum compatible with bowel perforation. 2. Thickened loops of small bowel in the left lower quadrant compatible with a nonspecific enteritis. 3. Small bowel obstruction proximal to the thickened loops of bowel. 4. Free fluid in the anatomic pelvis and surrounding the liver without definite abscess or mass lesion. CT abdomen with contrast may be of additional utility versus  surgical exploration. 5. Cholelithiasis without evidence of acute cholecystitis. 6. L2 superior endplate compression fracture appears remote. 7. Moderate stool the distal sigmoid colon and rectum. 8. Aortic Atherosclerosis

## 2021-10-22 NOTE — Progress Notes (Addendum)
eLink Physician-Brief Progress Note ?Patient Name: Jessica [name] ?DOB: 04-Jul-1961 ?MRN: [id number] ? ? ?Date of Service ? 10/22/2021  ?HPI/Events of Note ? Pt oliguric min urine on bladder scan as well. BP ot with SBP 60-80. Neo at 190 ? ?S/p ex lap on 15 th for perf sigmoid, hartmans procedure got 1 lit LR, albumin in PACU in evening.  ? ?Camera: ?Thin built, on nasal o2. No tachypnea. Sats good. ?Afebrile ? ?Data: ?Cr 1.24 ?Hyponatremia  ?Hg 10.5 ? ? ?  ?eICU Interventions ? Get Hg/hct. ?LR 500 ml bolus over 2 hrs.   ? ? ? ?  ? ?Elmer Sow ?10/22/2021, 11:16 PM ? ?00:58 ? ?Camera: hallucinating, was on precedex gt in past. ?Qtc 0.45 ? ?MAP ok. ?ABG stable acidosis. ?K 3.9 ?Hg stable. ? ?Maxed out on neo on PIV ? ?- haldol low dose once ?- sz and asp precautions ? ?Discused with RN.  ? ?6:52 ? ?Camera alert: ? ?Hypotension, on neo via PIV. Had + fluid' \\balance'$ , 750 total output including drains. ?No bleeding. Abdomen is soft per RN bed side exam. ?On nasal o2. Looks dry still. ? ?- albumin bolus once ?- start Levo via PIV protocol . Need a central line. Has art line already.  ?Will hand off to bed side MD.  ? ? ?

## 2021-10-22 NOTE — Anesthesia Preprocedure Evaluation (Addendum)
Anesthesia Evaluation  ?Patient identified by MRN, date of birth, ID band ?Patient awake ? ? ? ?Reviewed: ?Allergy & Precautions, NPO status , Patient's Chart, lab work & pertinent test results ? ?History of Anesthesia Complications ?Negative for: history of anesthetic complications ? ?Airway ?Mallampati: II ? ?TM Distance: >3 FB ?Neck ROM: Full ? ? ? Dental ? ?(+) Dental Advisory Given ?  ?Pulmonary ?Current Smoker,  ?  ?Pulmonary exam normal ? ? ? ? ? ? ? Cardiovascular ?negative cardio ROS ?Normal cardiovascular exam ? ? ?  ?Neuro/Psych ?negative neurological ROS ?   ? GI/Hepatic ?(+)  ?  ? substance abuse ? alcohol use, Free air in abdomen, severe lactic acidosis ?  ?Endo/Other  ?negative endocrine ROS ? Renal/GU ?ARFRenal disease  ?negative genitourinary ?  ?Musculoskeletal ?negative musculoskeletal ROS ?(+)  ? Abdominal ?  ?Peds ? Hematology ?negative hematology ROS ?(+)   ?Anesthesia Other Findings ? ? Reproductive/Obstetrics ? ?  ? ? ? ? ? ? ? ? ? ? ? ? ? ?  ?  ? ? ? ? ? ? ? ?Anesthesia Physical ?Anesthesia Plan ? ?ASA: 4 and emergent ? ?Anesthesia Plan: General  ? ?Post-op Pain Management: Ofirmev IV (intra-op)*  ? ?Induction: Intravenous ? ?PONV Risk Score and Plan: 3 and Ondansetron, Dexamethasone, Treatment may vary due to age or medical condition and Midazolam ? ?Airway Management Planned: Oral ETT ? ?Additional Equipment: None ? ?Intra-op Plan:  ? ?Post-operative Plan: Extubation in OR ? ?Informed Consent: I have reviewed the patients History and Physical, chart, labs and discussed the procedure including the risks, benefits and alternatives for the proposed anesthesia with the patient or authorized representative who has indicated his/her understanding and acceptance.  ? ? ? ?Dental advisory given ? ?Plan Discussed with:  ? ?Anesthesia Plan Comments:   ? ? ? ? ? ? ?Anesthesia Quick Evaluation ? ?

## 2021-10-22 NOTE — Progress Notes (Signed)
Unable to obtain forearm iv with u/s.Marland Kitchen  RN to call ccm. ?

## 2021-10-22 NOTE — H&P (Signed)
? ? ?CC: abd pain ? ?HPI: ?Jessica [name] is an 61 y.o. female who presented to the ED with abd pain, weakness, constipation and a fall 3 days ago.  Pain worsened last night around midnight, which prompted her to come to the ED ? ?PMH: Htn, GERD ? ?History reviewed. No pertinent surgical history. ? ?History reviewed. No pertinent family history. ? ?Social:  reports that she has been smoking. She has been smoking an average of 1 pack per day. She has never used smokeless tobacco. She reports current alcohol use. She reports that she does not use drugs. ? ?Allergies: No Known Allergies ? ?Medications: I have reviewed the patient's current medications. ? ?Results for orders placed or performed during the hospital encounter of 10/22/21 (from the past 48 hour(s))  ?Comprehensive metabolic panel     Status: Abnormal  ? Collection Time: 10/22/21 11:41 AM  ?Result Value Ref Range  ? Sodium 124 (L) 135 - 145 mmol/L  ?  Comment: REPEATED TO VERIFY  ? Potassium 3.0 (L) 3.5 - 5.1 mmol/L  ?  Comment: REPEATED TO VERIFY  ? Chloride 87 (L) 98 - 111 mmol/L  ?  Comment: REPEATED TO VERIFY  ? CO2 15 (L) 22 - 32 mmol/L  ?  Comment: REPEATED TO VERIFY  ? Glucose, Bld 95 70 - 99 mg/dL  ?  Comment: Glucose reference range applies only to samples taken after fasting for at least 8 hours.  ? BUN 31 (H) 6 - 20 mg/dL  ? Creatinine, Ser 1.52 (H) 0.44 - 1.00 mg/dL  ? Calcium 8.2 (L) 8.9 - 10.3 mg/dL  ?  Comment: REPEATED TO VERIFY  ? Total Protein 6.0 (L) 6.5 - 8.1 g/dL  ? Albumin 2.7 (L) 3.5 - 5.0 g/dL  ? AST 43 (H) 15 - 41 U/L  ? ALT 14 0 - 44 U/L  ? Alkaline Phosphatase 54 38 - 126 U/L  ? Total Bilirubin 2.0 (H) 0.3 - 1.2 mg/dL  ? GFR, Estimated 39 (L) >60 mL/min  ?  Comment: (NOTE) ?Calculated using the CKD-EPI Creatinine Equation (2021) ?  ? Anion gap >20 (H) 5 - 15  ?  Comment: Performed at [facility], [place] [address], [place], [place] [address]  ?Ethanol     Status: None  ? Collection Time: 10/22/21 11:41 AM   ?Result Value Ref Range  ? Alcohol, Ethyl (B) <10 <10 mg/dL  ?  Comment: (NOTE) ?Lowest detectable limit for serum alcohol is 10 mg/dL. ? ?For medical purposes only. ?Performed at [facility], [place] [address] ?[place], [place] [address] ?  ?Lactic acid, plasma     Status: Abnormal  ? Collection Time: 10/22/21 11:41 AM  ?Result Value Ref Range  ? Lactic Acid, Venous >9.0 (HH) 0.5 - 1.9 mmol/L  ?  Comment: CRITICAL RESULT CALLED TO, READ BACK BY AND VERIFIED WITH: ?PLASTER, J. RN AT 1218 10/22/2021 BY MECIAL J. ?Performed at [facility], [place] [address] [place], [place] [address] ?  ?Lipase, blood     Status: None  ? Collection Time: 10/22/21 11:41 AM  ?Result Value Ref Range  ? Lipase 27 11 - 51 U/L  ?  Comment: Performed at [facility], [place] [address], [place], [place] [address]  ?Troponin I (High Sensitivity)     Status: Abnormal  ? Collection Time: 10/22/21 11:41 AM  ?Result Value Ref Range  ? Troponin I (High Sensitivity) 43 (H) <18 ng/L  ?  Comment: (NOTE) ?Elevated high sensitivity troponin I (hsTnI) values and significant  ?changes across serial measurements may suggest ACS but many other  ?chronic and acute conditions are known to elevate hsTnI results.  ?Refer to the Links section for chest pain algorithms and additional  ?guidance. ?Performed at [facility], [place] [address] ?[place], [place] [address] ?  ?CBC with Differential     Status: Abnormal  ? Collection Time: 10/22/21 11:41 AM  ?Result Value Ref Range  ? WBC 15.9 (H) 4.0 - 10.5 K/uL  ? RBC 4.01 3.87 - 5.11 MIL/uL  ? Hemoglobin 13.0 12.0 - 15.0 g/dL  ? HCT 37.1 36.0 - 46.0 %  ? MCV 92.5 80.0 - 100.0 fL  ? MCH 32.4 26.0 - 34.0 pg  ? MCHC 35.0 30.0 - 36.0 g/dL  ? RDW 12.5 11.5 - 15.5 %  ? Platelets 287 150 - 400 K/uL  ? nRBC 0.0 0.0 - 0.2 %  ? Neutrophils Relative % 83 %  ? Neutro Abs 13.4 (H) 1.7 - 7.7 K/uL  ? Lymphocytes Relative 6 %  ? Lymphs Abs 0.9 0.7 - 4.0 K/uL  ?  Monocytes Relative 9 %  ? Monocytes Absolute 1.4 (H) 0.1 - 1.0 K/uL  ? Eosinophils Relative 0 %  ? Eosinophils Absolute 0.0 0.0 - 0.5 K/uL  ? Basophils Relative 1 %  ? Basophils Absolute 0.1 0.0 - 0.1 K/uL  ? WBC Morphology MILD LEFT SHIFT (1-5% METAS, OCC MYELO, OCC BANDS)   ? Immature Granulocytes 1 %  ? Abs Immature Granulocytes 0.13 (H) 0.00 - 0.07 K/uL  ? Giant PLTs PRESENT   ?  Comment: Performed at [facility], [place] [address] [place], [place] [address]  ?Type and screen [place]     Status: None  ? Collection Time: 10/22/21 11:41 AM  ?Result Value Ref Range  ? ABO/RH(D) O POS   ? Antibody Screen NEG   ? Sample Expiration    ?  10/25/2021,2359 ?Performed at [facility], [place] [address] [place],  [address] ?  ? ? ?CT ABDOMEN PELVIS WO CONTRAST ? ?Result Date: 10/22/2021 ?CLINICAL DATA:  Abdominal pain, acute, nonlocalized. Constipation for 4 days. EXAM: CT ABDOMEN AND PELVIS WITHOUT CONTRAST TECHNIQUE: Multidetector CT imaging of the abdomen and pelvis was performed following the standard protocol without IV contrast. RADIATION DOSE REDUCTION: This exam was performed according to the departmental dose-optimization program which includes automated exposure control, adjustment of the mA and/or kV according to patient size and/or use of iterative reconstruction technique. COMPARISON:  None. FINDINGS: Lower chest: The lung bases are clear without focal nodule, mass, or airspace disease. Heart size is normal. No significant pleural or pericardial effusion is present. Hepatobiliary: 6 mm gallstone is present. Common bile duct and gallbladder are otherwise within normal limits. Liver is unremarkable. Pancreas: Unremarkable. No pancreatic ductal dilatation or surrounding inflammatory changes. Spleen: Normal in size without focal abnormality. Adrenals/Urinary Tract: Adrenal glands are unremarkable. Kidneys are normal, without renal calculi, focal  lesion, or hydronephrosis. Bladder is unremarkable. Stomach/Bowel: Stomach is distended scratched at the stomach and duodenum are distended. Proximal small bowel loops are distended up to 4.4 cm. Distal small bowel is collapsed. Transition point is not discretely identified. Thickened loops of small bowel are present in the left lower quadrant. No definite pneumoperitoneum. The ascending and transverse colon contains stool. Moderate stool is present the distal sigmoid colon and rectum. Vascular/Lymphatic: Atherosclerotic calcifications are present the aorta branch  vessels without aneurysm. Reproductive: Uterus and bilateral adnexa are unremarkable. Other: Free fluid is present in the anatomic pelvis. Diffuse pneumoperitoneum noted. There is some fluid surrounding the liver. No definite abscess. Musculoskeletal: L2 superior endplate compression fracture appears remote. Chronic endplate sclerotic changes present at L3-4. No other focal osseous lesions are present. Bony pelvis within normal limits. Hips are located and normal. IMPRESSION: 1. Diffuse pneumoperitoneum compatible with bowel perforation. 2. Thickened loops of small bowel in the left lower quadrant compatible with a nonspecific enteritis. 3. Small bowel obstruction proximal to the thickened loops of bowel. 4. Free fluid in the anatomic pelvis and surrounding the liver without definite abscess or mass lesion. CT abdomen with contrast may be of additional utility versus surgical exploration. 5. Cholelithiasis without evidence of acute cholecystitis. 6. L2 superior endplate compression fracture appears remote. 7. Moderate stool the distal sigmoid colon and rectum. 8. Aortic Atherosclerosis (ICD10-I70.0). These results were called by telephone at the time of interpretation on 10/22/2021 at 12:52 pm to provider Margarita Mail, PA, who verbally acknowledged these results. Electronically Signed   By: San Morelle M.D.   On: 10/22/2021 13:04  ? ?DG Chest  Port 1 View ? ?Result Date: 10/22/2021 ?CLINICAL DATA:  Abdominal pain EXAM: PORTABLE CHEST 1 VIEW COMPARISON:  12/04/2017 FINDINGS: The heart size and mediastinal contours are within normal limits. Both lungs are cl

## 2021-10-22 NOTE — Anesthesia Procedure Notes (Signed)
Arterial Line Insertion ?Performed by: Lidia Collum, MD, anesthesiologist ? Preanesthetic checklist: patient identified, risks and benefits discussed and surgical consent ?Left, radial was placed ?Catheter size: 20 G ?Hand hygiene performed  and Seldinger technique used ? ?Attempts: 2 ?Procedure performed using ultrasound guided technique. ?Ultrasound Notes:anatomy identified, needle tip was noted to be adjacent to the nerve/plexus identified and no ultrasound evidence of intravascular and/or intraneural injection ?Following insertion, dressing applied and Biopatch. ?Post procedure assessment: normal ? ?Patient tolerated the procedure well with no immediate complications. ? ? ? ?

## 2021-10-22 NOTE — Progress Notes (Signed)
[id number] Pt arrived to room 1226 via stretcher from PACU. Pt A&Ox4. Pt c/o abd pain. Orders released. Awaiting for pharmacy to verify PCA orders and load morphine vial in pyxis. Pt on 3L [place]., titrated oxygen up. Surgical wounds assessed. Pt's colostomy leaking into abdominal wound. RN removed dressing, flushed out abdominal wound with normal saline, and repacked the wound wet to dry. Colostomy changed, G tube dressing, and JP drain dressings all changed. RN could not obtain temperature on patient orally or axillary. RN spoke to Dr. Verlee Monte on unit, verbal order given to replace foley with temp probe foley. Pt denies being cold, pt stated she is actually hot and will not wear bear hugger. Pt's BP low, neo titrated up per orders. Spoke with MD at bedside for patient's POC regarding withdrawal. New orders placed by MD. Pt resting comfortably after PCA set up. WCTM ?

## 2021-10-22 NOTE — ED Triage Notes (Signed)
EMS reports frome home, abdominal pian, weakness, constipation x 4 days, Fall 3 days ago, R shoulder and arm pain. ? ?BP 89/54 ?HR 115 ?RR 22 ?Sp02 96 RA ?CBG 114 ? ?20 L forearm ? ?700ML NS ?'4mg'$  Zofran ?

## 2021-10-22 NOTE — Transfer of Care (Signed)
Immediate Anesthesia Transfer of Care Note ? ?Patient: Jessica [name] ? ?Procedure(s) Performed: EXPLORATORY LAPAROTOMY hartmans (Abdomen) ? ?Patient Location: PACU ?  ?Anesthesia Type:General ? ?Level of Consciousness: awake ? ?Airway & Oxygen Therapy: Patient Spontanous Breathing and Patient connected to face mask oxygen ? ?Post-op Assessment: Report given to RN and Post -op Vital signs reviewed and stable ? ?Post vital signs: Reviewed and stable ? ?Last Vitals:  ?Vitals Value Taken Time  ?BP 135/86 10/22/21 1637  ?Temp    ?Pulse 44 10/22/21 1640  ?Resp 27 10/22/21 1640  ?SpO2 81 % 10/22/21 1640  ?Vitals shown include unvalidated device data. ? ?Last Pain:  ?Vitals:  ? 10/22/21 1213  ?TempSrc: Rectal  ?PainSc:   ?   ? ?  ? ?Complications: No notable events documented. ?

## 2021-10-22 NOTE — Anesthesia Procedure Notes (Signed)
Procedure Name: Intubation ?Date/Time: 10/22/2021 2:08 PM ?Performed by: Cynda Familia, CRNA ?Pre-anesthesia Checklist: Patient identified, Emergency Drugs available, Suction available and Patient being monitored ?Patient Re-evaluated:Patient Re-evaluated prior to induction ?Oxygen Delivery Method: Circle system utilized ?Preoxygenation: Pre-oxygenation with 100% oxygen ?Induction Type: IV induction, Rapid sequence and Cricoid Pressure applied ?Laryngoscope Size: Sabra Heck and 2 ?Grade View: Grade I ?Tube type: Oral ?Tube size: 7.0 mm ?Number of attempts: 1 ?Airway Equipment and Method: Stylet ?Placement Confirmation: ETT inserted through vocal cords under direct vision, positive ETCO2 and breath sounds checked- equal and bilateral ?Secured at: 21 cm ?Tube secured with: Tape ?Dental Injury: Teeth and Oropharynx as per pre-operative assessment  ?Comments: IV induction Witman-- intubation AM CRNA atraumatic-- teeth and mouth as preop--- front teeth with chipping-- unchanged-- bilat BS ? ? ? ? ?

## 2021-10-22 NOTE — Progress Notes (Signed)
Elink following code sepsis °

## 2021-10-22 NOTE — H&P (Addendum)
? ?NAME:  Jessica [name], MRN:  [id number], DOB:  08-04-1960, LOS: 0 ?ADMISSION DATE:  10/22/2021, CONSULTATION DATE:  10/22/21 ?REFERRING MD:  Francia Greaves, CHIEF COMPLAINT:  abdominal pain  ? ?History of Present Illness:  ?[name] with history of alcohol use disorder with prior severe withdrawal/DT requiring ICU admission and precedex, smoking who presented to the ED today for severe diffuse abdominal pain that began the night before admission. She had had constipation for 4d PTA. Endorsed frequent alcohol use. Last drink was day PTA.  ? ?In ED she was found to have pneumoperitoneum and free fluid on CT A/P, severe lactic acidosis. She was started on broad spectrum antibiotics and has been given a total of 5L of crystalloid. Surgery was consulted and she was taken to OR where she underwent ex lap and was found to have perforated sigmoid colon, then had hartman's procedure and insertion of g-tube which was left to gravity drainage, JP bulb in pelvis. She was noted to have feculent peritonitis in the pelvis.  ? ?Pertinent  Medical History  ?Etoh use disorder ?History of DT ?Smoking ? ?Significant Hospital Events: ?Including procedures, antibiotic start and stop dates in addition to other pertinent events   ?10/22/21 ex lap, hartman's procedure, g tube placement. Extubated in OR ? ?Interim History / Subjective:  ?Extubated in OR, came up on 50 mcg/min of neo. Still drowsy but easily arousable. ? ?Objective   ?Blood pressure (!) 107/94, pulse (!) 110, temperature (!) 94.9 ?F (34.9 ?C), temperature source Rectal, resp. rate 20, height '5\' 11"'$  (1.803 m), weight 54 kg, SpO2 97 %. ?   ?   ?No intake or output data in the 24 hours ending 10/22/21 1329 ?Filed Weights  ? 10/22/21 1238  ?Weight: 54 kg  ? ? ?Examination: ?General appearance: 61 y.o., female,drowsy but arousable ?Eyes: PERRL, tracking appropriately ?HENT: NCAT; dry MM ?Neck: Trachea midline; no lymphadenopathy, no JVD ?Lungs: rhonchorous bl, with normal respiratory  effort ?CV: tachy RR, no murmur  ?Abdomen: bs hypoactive, ostomy pouch with bilious appearing liquid stool, jp drain in lower right quadrant, g-tube draining to gravity ?Extremities: No peripheral edema, warm ?Neuro: follows commands, grossly nonfocal ? ? ?Resolved Hospital Problem list   ? ? ?Assessment & Plan:  ? ?# Sepsis due to feculent peritonitis with sigmoid perforation s/p hartman's procedure, g-tube placement 4/15 ?- continue vanc/cefoxitin/flagyl, follow cultures and narrow as able ?- g-tube vented ?- feeding timing per surgery ?- wound/ostomy consulted ?- surgery following ? ?# Shock ?Likely sedation-related ?- wean neo for map 63 ? ?# Hyponatremia ?Urine electrolytes won't mean too much after this fluid exposure. Suspect this is chronic. ?- trend Na ?- correct potassium first, assess Na response to potassium correction ? ?# AGMA ?# Lactic acidosis ?Due to bowel hypoperfusion ?- trend ? ?# Alcohol use ?# History of DT ?Has needed precedex before in ICU. Will start with benzodiazepine prophylaxis of severe withdrawal with low dose valium given that she's also going to need PCA post-op. Can add precedex if still requires frequent ativan dosing.  ?- valium 5 q6 IV ?- ciwa based ativan ?- thiamine ? ?# AKI ?- trend Bmet ?- avoid nephrotoxins ?- watch UOP closely ? ?# Transaminitis ?Early shock/sepsis vs alcohol related ?- trend LFTs ? ?# Electrolyte abnormalities ?- trend Bmet, Mg, Phos, correct as needed ? ? ?Best Practice (right click and "Reselect all SmartList Selections" daily)  ? ?Diet/type: NPO ?DVT prophylaxis: SCD ?GI prophylaxis: PPI ?Lines: N/A ?Foley:  N/A ?Code Status:  full  code ?Last date of multidisciplinary goals of care discussion [Will update later this afternoon] ? ?Labs   ?CBC: ?Recent Labs  ?Lab 10/22/21 ?1141  ?WBC 15.9*  ?NEUTROABS 13.4*  ?HGB 13.0  ?HCT 37.1  ?MCV 92.5  ?PLT 287  ? ? ?Basic Metabolic Panel: ?Recent Labs  ?Lab 10/22/21 ?1141  ?NA 124*  ?K 3.0*  ?CL 87*  ?CO2 15*   ?GLUCOSE 95  ?BUN 31*  ?CREATININE 1.52*  ?CALCIUM 8.2*  ? ?GFR: ?Estimated Creatinine Clearance: 33.6 mL/min (A) (by C-G formula based on SCr of 1.52 mg/dL (H)). ?Recent Labs  ?Lab 10/22/21 ?1141  ?WBC 15.9*  ?LATICACIDVEN >9.0*  ? ? ?Liver Function Tests: ?Recent Labs  ?Lab 10/22/21 ?1141  ?AST 43*  ?ALT 14  ?ALKPHOS 54  ?BILITOT 2.0*  ?PROT 6.0*  ?ALBUMIN 2.7*  ? ?Recent Labs  ?Lab 10/22/21 ?1141  ?LIPASE 27  ? ?No results for input(s): AMMONIA in the last 168 hours. ? ?ABG ?   ?Component Value Date/Time  ? TCO2 15 (L) 12/04/2017 2125  ?  ? ?Coagulation Profile: ?No results for input(s): INR, PROTIME in the last 168 hours. ? ?Cardiac Enzymes: ?No results for input(s): CKTOTAL, CKMB, CKMBINDEX, TROPONINI in the last 168 hours. ? ?HbA1C: ?No results found for: HGBA1C ? ?CBG: ?No results for input(s): GLUCAP in the last 168 hours. ? ?Review of Systems:   ?Unable to obtain, still drowsy and a little inattentive postoperatively ? ?Past Medical History:  ?She,  has no past medical history on file.  ? ?Surgical History:  ?History reviewed. No pertinent surgical history.  ? ?Social History:  ? reports that she has been smoking. She has been smoking an average of 1 pack per day. She has never used smokeless tobacco. She reports current alcohol use. She reports that she does not use drugs.  ? ?Family History:  ?Her family history is not on file.  ? ?Allergies ?No Known Allergies  ? ?Home Medications  ?Prior to Admission medications   ?Medication Sig Start Date End Date Taking? Authorizing Provider  ?cetirizine (ZYRTEC) 10 MG tablet Take 10 mg by mouth daily as needed for allergies.    [provider]  ?diclofenac Sodium (VOLTAREN) 1 % GEL Apply 4 g topically 4 (four) times daily. 09/21/21   Deno Etienne, DO  ?famotidine (PEPCID) 40 MG tablet Take 40 mg by mouth at bedtime. 10/10/21   [provider]  ?fluticasone (FLONASE) 50 MCG/ACT nasal spray Place 1-2 sprays into both nostrils daily as needed for  allergies or rhinitis.    [provider]  ?lisinopril-hydrochlorothiazide (ZESTORETIC) 10-12.5 MG tablet Take 1 tablet by mouth daily. 09/26/21   [provider]  ?loratadine (CLARITIN) 10 MG tablet Take 10 mg by mouth daily as needed for allergies.    [provider]  ?mirtazapine (REMERON SOL-TAB) 15 MG disintegrating tablet Take 1 tablet (15 mg total) by mouth at bedtime. 12/12/17 01/11/18  Arrien, Jimmy Picket, MD  ?morphine (MSIR) 15 MG tablet Take 0.5 tablets (7.5 mg total) by mouth every 4 (four) hours as needed for severe pain. 09/26/21   Vanetta Mulders, MD  ?ondansetron (ZOFRAN) 8 MG tablet Take 8 mg by mouth daily as needed for nausea/vomiting. 10/10/21   [provider]  ?  ? ?Critical care time: 32 minutes ?  ? ? ? ?  ?

## 2021-10-23 ENCOUNTER — Inpatient Hospital Stay (HOSPITAL_COMMUNITY): Payer: Self-pay

## 2021-10-23 ENCOUNTER — Encounter (HOSPITAL_COMMUNITY): Payer: Self-pay | Admitting: General Surgery

## 2021-10-23 LAB — COMPREHENSIVE METABOLIC PANEL
ALT: 16 U/L (ref 0–44)
AST: 70 U/L — ABNORMAL HIGH (ref 15–41)
Albumin: 2.2 g/dL — ABNORMAL LOW (ref 3.5–5.0)
Alkaline Phosphatase: 29 U/L — ABNORMAL LOW (ref 38–126)
Anion gap: 12 (ref 5–15)
BUN: 36 mg/dL — ABNORMAL HIGH (ref 6–20)
CO2: 13 mmol/L — ABNORMAL LOW (ref 22–32)
Calcium: 6.8 mg/dL — ABNORMAL LOW (ref 8.9–10.3)
Chloride: 103 mmol/L (ref 98–111)
Creatinine, Ser: 1.47 mg/dL — ABNORMAL HIGH (ref 0.44–1.00)
GFR, Estimated: 41 mL/min — ABNORMAL LOW (ref >60)
Glucose, Bld: 85 mg/dL (ref 70–99)
Potassium: 4.5 mmol/L (ref 3.5–5.1)
Sodium: 128 mmol/L — ABNORMAL LOW (ref 135–145)
Total Bilirubin: 1.6 mg/dL — ABNORMAL HIGH (ref 0.3–1.2)
Total Protein: 4.3 g/dL — ABNORMAL LOW (ref 6.5–8.1)

## 2021-10-23 LAB — CBC WITH DIFFERENTIAL/PLATELET
Abs Immature Granulocytes: 0.15 10*3/uL — ABNORMAL HIGH (ref 0.00–0.07)
Basophils Absolute: 0 10*3/uL (ref 0.0–0.1)
Basophils Relative: 0 %
Eosinophils Absolute: 0 10*3/uL (ref 0.0–0.5)
Eosinophils Relative: 0 %
HCT: 34.5 % — ABNORMAL LOW (ref 36.0–46.0)
Hemoglobin: 11.6 g/dL — ABNORMAL LOW (ref 12.0–15.0)
Immature Granulocytes: 1 %
Lymphocytes Relative: 4 %
Lymphs Abs: 0.7 10*3/uL (ref 0.7–4.0)
MCH: 31.2 pg (ref 26.0–34.0)
MCHC: 33.6 g/dL (ref 30.0–36.0)
MCV: 92.7 fL (ref 80.0–100.0)
Monocytes Absolute: 0.4 10*3/uL (ref 0.1–1.0)
Monocytes Relative: 2 %
Neutro Abs: 17.8 10*3/uL — ABNORMAL HIGH (ref 1.7–7.7)
Neutrophils Relative %: 93 %
Platelets: 223 10*3/uL (ref 150–400)
RBC: 3.72 MIL/uL — ABNORMAL LOW (ref 3.87–5.11)
RDW: 12.7 % (ref 11.5–15.5)
WBC: 19.1 10*3/uL — ABNORMAL HIGH (ref 4.0–10.5)
nRBC: 0 % (ref 0.0–0.2)

## 2021-10-23 LAB — HEMOGLOBIN AND HEMATOCRIT, BLOOD
HCT: 34.8 % — ABNORMAL LOW (ref 36.0–46.0)
Hemoglobin: 11.9 g/dL — ABNORMAL LOW (ref 12.0–15.0)

## 2021-10-23 LAB — HIV ANTIBODY (ROUTINE TESTING W REFLEX): HIV Screen 4th Generation wRfx: NONREACTIVE

## 2021-10-23 LAB — AMMONIA: Ammonia: 49 umol/L — ABNORMAL HIGH (ref 9–35)

## 2021-10-23 LAB — PHOSPHORUS: Phosphorus: 5.5 mg/dL — ABNORMAL HIGH (ref 2.5–4.6)

## 2021-10-23 LAB — VANCOMYCIN, RANDOM: Vancomycin Rm: 11

## 2021-10-23 LAB — MRSA NEXT GEN BY PCR, NASAL: MRSA by PCR Next Gen: NOT DETECTED

## 2021-10-23 LAB — MAGNESIUM: Magnesium: 1.7 mg/dL (ref 1.7–2.4)

## 2021-10-23 MED ORDER — SODIUM BICARBONATE 8.4 % IV SOLN
INTRAVENOUS | Status: DC
  Filled 2021-10-23 (×2): qty 150
  Filled 2021-10-23: qty 1000
  Filled 2021-10-23: qty 150

## 2021-10-23 MED ORDER — SODIUM CHLORIDE 0.9 % IV SOLN
250.0000 mL | INTRAVENOUS | Status: DC
  Administered 2021-10-25 – 2021-12-05 (×7): 250 mL via INTRAVENOUS

## 2021-10-23 MED ORDER — SODIUM CHLORIDE 0.9% FLUSH: 10.0000 mL | INTRAVENOUS | Status: DC | PRN

## 2021-10-23 MED ORDER — SODIUM CHLORIDE 0.9% FLUSH
10.0000 mL | Freq: Two times a day (BID) | INTRAVENOUS | Status: DC
  Administered 2021-10-23: 10 mL
  Administered 2021-10-23 – 2021-10-25 (×3): 30 mL
  Administered 2021-10-25 – 2021-10-26 (×3): 10 mL

## 2021-10-23 MED ORDER — SODIUM CHLORIDE 0.9 % IV BOLUS
500.0000 mL | Freq: Once | INTRAVENOUS | Status: AC
  Administered 2021-10-23: 500 mL via INTRAVENOUS

## 2021-10-23 MED ORDER — HALOPERIDOL LACTATE 5 MG/ML IJ SOLN
1.0000 mg | Freq: Once | INTRAMUSCULAR | Status: AC
  Administered 2021-10-23: 1 mg via INTRAVENOUS
  Filled 2021-10-23: qty 1

## 2021-10-23 MED ORDER — CHLORHEXIDINE GLUCONATE CLOTH 2 % EX PADS
6.0000 | MEDICATED_PAD | Freq: Every day | CUTANEOUS | Status: DC
Start: 2021-10-23 — End: 2021-11-06
  Administered 2021-10-23 – 2021-11-06 (×12): 6 via TOPICAL

## 2021-10-23 MED ORDER — HYDROMORPHONE HCL 1 MG/ML IJ SOLN: 0.5000 mg | INTRAMUSCULAR | Status: DC | PRN

## 2021-10-23 MED ORDER — ORAL CARE MOUTH RINSE
15.0000 mL | Freq: Two times a day (BID) | OROMUCOSAL | Status: DC
  Administered 2021-10-23 (×2): 15 mL via OROMUCOSAL

## 2021-10-23 MED ORDER — NOREPINEPHRINE 4 MG/250ML-% IV SOLN
0.0000 ug/min | INTRAVENOUS | Status: DC
  Administered 2021-10-23: 16 ug/min via INTRAVENOUS
  Administered 2021-10-24: 30 ug/min via INTRAVENOUS
  Administered 2021-10-24: 10 ug/min via INTRAVENOUS
  Administered 2021-10-24: 16 ug/min via INTRAVENOUS
  Administered 2021-10-24: 30 ug/min via INTRAVENOUS
  Administered 2021-10-24: 15 ug/min via INTRAVENOUS
  Filled 2021-10-23 (×6): qty 250

## 2021-10-23 MED ORDER — PHENYLEPHRINE HCL-NACL 20-0.9 MG/250ML-% IV SOLN
25.0000 ug/min | INTRAVENOUS | Status: DC
  Administered 2021-10-23: 35 ug/min via INTRAVENOUS
  Filled 2021-10-23: qty 250

## 2021-10-23 MED ORDER — SODIUM CHLORIDE 0.9% FLUSH
10.0000 mL | Freq: Two times a day (BID) | INTRAVENOUS | Status: DC
  Administered 2021-10-23 – 2021-10-24 (×2): 10 mL
  Administered 2021-10-24: 30 mL
  Administered 2021-10-25 (×2): 10 mL
  Administered 2021-10-26: 30 mL
  Administered 2021-10-26 – 2021-10-27 (×2): 10 mL
  Administered 2021-10-27: 40 mL
  Administered 2021-10-28 – 2021-10-29 (×3): 10 mL
  Administered 2021-10-29 – 2021-10-30 (×2): 30 mL
  Administered 2021-10-30: 10 mL
  Administered 2021-10-31 – 2021-11-01 (×3): 30 mL
  Administered 2021-11-01 – 2021-11-04 (×4): 10 mL
  Administered 2021-11-05: 20 mL
  Administered 2021-11-05 – 2021-11-25 (×30): 10 mL
  Administered 2021-11-26: 40 mL
  Administered 2021-11-30: 30 mL
  Administered 2021-12-01 – 2021-12-05 (×2): 10 mL

## 2021-10-23 MED ORDER — NOREPINEPHRINE 4 MG/250ML-% IV SOLN
2.0000 ug/min | INTRAVENOUS | Status: DC
  Administered 2021-10-23: 10 ug/min via INTRAVENOUS
  Administered 2021-10-23: 2 ug/min via INTRAVENOUS
  Filled 2021-10-23 (×2): qty 250

## 2021-10-23 MED ORDER — VANCOMYCIN HCL IN DEXTROSE 1-5 GM/200ML-% IV SOLN
1000.0000 mg | Freq: Once | INTRAVENOUS | Status: AC
  Administered 2021-10-23: 1000 mg via INTRAVENOUS
  Filled 2021-10-23: qty 200

## 2021-10-23 MED ORDER — HYDROMORPHONE HCL 1 MG/ML IJ SOLN: 0.2000 mg | INTRAMUSCULAR | Status: DC | PRN

## 2021-10-23 MED ORDER — LACTATED RINGERS IV BOLUS
500.0000 mL | Freq: Once | INTRAVENOUS | Status: AC
  Administered 2021-10-23: 500 mL via INTRAVENOUS

## 2021-10-23 MED ORDER — ALBUMIN HUMAN 25 % IV SOLN
12.5000 g | Freq: Once | INTRAVENOUS | Status: AC
  Administered 2021-10-23: 12.5 g via INTRAVENOUS
  Filled 2021-10-23: qty 50

## 2021-10-23 NOTE — Consult Note (Signed)
WOC Nurse Consult Note: ?Reason for Consult: New Colostomy created emergently by Dr. Marcello Moores on 10/22/21. ? ?[place] Nurse will see on Monday, 10/24/21. ? ?Thank you for this consult and for involving [place] in this patient's POC. ? ?[place] nursing team will follow, and will remain available to this patient, the nursing and medical teams.   ? ?Maudie Flakes, MSN, RN, [place], [facility], CWON-AP, [place]  ?Pager# [phone]  ? ? ? ? ?

## 2021-10-23 NOTE — Progress Notes (Signed)
? ?NAME:  Jessica [name], MRN:  [id number], DOB:  February 04, 1961, LOS: 1 ?ADMISSION DATE:  10/22/2021, CONSULTATION DATE:  10/22/21 ?REFERRING MD:  Francia Greaves, CHIEF COMPLAINT:  abdominal pain  ? ?History of Present Illness:  ?[name] with history of alcohol use disorder with prior severe withdrawal/DT requiring ICU admission and precedex, smoking who presented to the ED today for severe diffuse abdominal pain that began the night before admission. She had had constipation for 4d PTA. Endorsed frequent alcohol use. Last drink was day PTA.  ? ?In ED she was found to have pneumoperitoneum and free fluid on CT A/P, severe lactic acidosis. She was started on broad spectrum antibiotics and has been given a total of 5L of crystalloid. Surgery was consulted and she was taken to OR where she underwent ex lap and was found to have perforated sigmoid colon, then had hartman's procedure and insertion of g-tube which was left to gravity drainage, JP bulb in pelvis. She was noted to have feculent peritonitis in the pelvis.  ? ?Pertinent  Medical History  ?Etoh use disorder ?History of DT ?Smoking ? ?Significant Hospital Events: ?Including procedures, antibiotic start and stop dates in addition to other pertinent events   ?10/22/21 ex lap, hartman's procedure, g tube placement. Extubated in OR ? ?Interim History / Subjective:  ?Rising pressor requirement overnight, given LR bolus 500 and albumin 12.5g. Has missed scheduled dose of valium due to hypotension.  ? ?Objective   ?Blood pressure (!) 89/23, pulse (!) 114, temperature (!) 96.1 ?F (35.6 ?C), temperature source Axillary, resp. rate (!) 23, height 5' 10.5" (1.791 m), weight 53.8 kg, SpO2 97 %. ?   ?FiO2 (%):  [23 %] 23 %  ? ?Intake/Output Summary (Last 24 hours) at 10/23/2021 0735 ?Last data filed at 10/23/2021 0450 ?Gross per 24 hour  ?Intake 6990.48 ml  ?Output 763 ml  ?Net 6227.48 ml  ? ?Filed Weights  ? 10/22/21 1238 10/22/21 1739  ?Weight: 54 kg 53.8 kg  ? ? ?Examination: ?General  appearance: 61 y.o., female,drowsy but easily arousable ?Eyes: PERRL, tracking appropriately ?HENT:dry MM ?Lungs: rhonchorous bl, with normal respiratory effort ?CV: tachy RR, no murmur  ?Abdomen: bs hypoactive, ostomy pouch with bilious appearing liquid stool, jp drain in lower right quadrant, g-tube draining to gravity ?Extremities: No peripheral edema, warm ?Neuro: follows commands, grossly nonfocal ? ?Labs/imaging reviewed ? ? ?Resolved Hospital Problem list   ? ? ?Assessment & Plan:  ? ?# Septic shock due to feculent peritonitis with sigmoid perforation s/p hartman's procedure, g-tube placement 4/15 ?- give another 500cc bolus LR ?- wean neo, levo for MAP 65 ?- continue vanc/cefoxitin/flagyl, follow cultures and narrow as able ?- g-tube vented ?- feeding timing per surgery ?- wound/ostomy consulted ?- surgery following ? ?# Hyponatremia ?Urine electrolytes won't mean too much after this fluid exposure. Suspect this is chronic. ?- trend Na ?- bicarb gtt at low rate as below ? ?# AGMA and NAGMA ?# Lactic acidosis ?- bicarb gtt at 75/hr ? ?# Alcohol use ?# History of DT ?Has needed precedex before in ICU. Will start with benzodiazepine prophylaxis of severe withdrawal with low dose valium given that she's also going to need PCA post-op. Can add precedex if still requires frequent ativan dosing.  ?- valium 5 q6 IV ?- ciwa based ativan ?- thiamine ? ?# AKI ?- trend Bmet ?- avoid nephrotoxins ?- watch UOP closely ? ?# Transaminitis ?Early shock/sepsis vs alcohol related ?- trend LFTs ? ?# Electrolyte abnormalities ?- trend Bmet, Mg, Phos,  correct as needed ? ? ?Best Practice (right click and "Reselect all SmartList Selections" daily)  ? ?Diet/type: NPO ?DVT prophylaxis: SCD ?GI prophylaxis: PPI ?Lines: N/A ?Foley:  N/A ?Code Status:  full code ?Last date of multidisciplinary goals of care discussion [Discussed with patient at bedside this morning] ? ?Critical care time: 35 minutes ?  ? ? ? ?  ?

## 2021-10-23 NOTE — Progress Notes (Addendum)
?Subjective ?No acute events. Awake, alert, numerous complaints surrounding her care, distrust of medical care teams in general. Worked to help her understand our role, reviewed her workup and procedure as well as plans moving forward. She ultimately asked me to leave her room.  ? ?Objective: ?Vital signs in last 24 hours: ?Temp:  [94.9 ?F (34.9 ?C)-97.6 ?F (36.4 ?C)] 97.5 ?F (36.4 ?C) (04/16 0700) ?Pulse Rate:  [103-121] 118 (04/16 0800) ?Resp:  [18-30] 20 (04/16 0800) ?BP: (55-135)/(22-94) 110/52 (04/16 0800) ?SpO2:  [73 %-97 %] 95 % (04/16 0800) ?Arterial Line BP: (60-125)/(38-69) 116/66 (04/16 0815) ?FiO2 (%):  [23 %] 23 % (04/16 0354) ?Weight:  [53.8 kg-54 kg] 53.8 kg (04/15 1739) ?Last BM Date : 10/22/21 ? ?Intake/Output from previous day: ?04/15 0701 - 04/16 0700 ?In: 6990.5 [I.V.:6002.4; IV Piggyback:988.1] ?Out: 763 [Urine:183; Drains:455; Stool:75; Blood:50] ?Intake/Output this shift: ?Total I/O ?In: -  ?Out: 350 [Drains:250; Stool:100] ? ?Gen: NAD, comfortable in bed ?CV: RRR ?Pulm: Normal work of breathing ?Abd: Soft, appropriate incisional tenderness, wounds dressed and dry. Ostomy pink with some semi solid stool in appliance ?Ext: SCDs in place ? ?Lab Results: ?CBC  ?Recent Labs  ?  10/22/21 ?1141 10/22/21 ?1448 10/22/21 ?2350 10/23/21 ?0533  ?WBC 15.9*  --   --  19.1*  ?HGB 13.0   < > 11.9* 11.6*  ?HCT 37.1   < > 34.8* 34.5*  ?PLT 287  --   --  223  ? < > = values in this interval not displayed.  ? ?BMET ?Recent Labs  ?  10/22/21 ?2119 10/23/21 ?0533  ?NA 126* 128*  ?K 3.8 4.5  ?CL 100 103  ?CO2 15* 13*  ?GLUCOSE 99 85  ?BUN 33* 36*  ?CREATININE 1.24* 1.47*  ?CALCIUM 6.9* 6.8*  ? ?PT/INR ?Recent Labs  ?  10/22/21 ?2119  ?LABPROT 15.7*  ?INR 1.3*  ? ?ABG ?Recent Labs  ?  10/22/21 ?1448 10/22/21 ?1529  ?PHART 7.221* 7.227*  ?HCO3 20.7 18.6*  ? ? ?Studies/Results: ? ?Anti-infectives: ?Anti-infectives (From admission, onward)  ? ? Start     Dose/Rate Route Frequency Ordered Stop  ? 10/23/21 0200   ceFEPIme (MAXIPIME) 2 g in sodium chloride 0.9 % 100 mL IVPB       ? 2 g ?200 mL/hr over 30 Minutes Intravenous Every 12 hours 10/22/21 2032    ? 10/22/21 2200  cefOXitin (MEFOXIN) 2 g in sodium chloride 0.9 % 100 mL IVPB  Status:  Discontinued       ? 2 g ?200 mL/hr over 30 Minutes Intravenous Every 8 hours 10/22/21 1706 10/22/21 2032  ? 10/22/21 1956  vancomycin variable dose per unstable renal function (pharmacist dosing)       ?  Does not apply See admin instructions 10/22/21 1957    ? 10/22/21 1245  vancomycin (VANCOCIN) IVPB 1000 mg/200 mL premix       ? 1,000 mg ?200 mL/hr over 60 Minutes Intravenous  Once 10/22/21 1241 10/22/21 1402  ? 10/22/21 1245  ceFEPIme (MAXIPIME) 2 g in sodium chloride 0.9 % 100 mL IVPB       ? 2 g ?200 mL/hr over 30 Minutes Intravenous  Once 10/22/21 1241 10/22/21 1321  ? 10/22/21 1230  metroNIDAZOLE (FLAGYL) IVPB 500 mg       ? 500 mg ?100 mL/hr over 60 Minutes Intravenous Every 12 hours 10/22/21 1222    ? ?  ? ? ? ?Assessment/Plan: ?Patient Active Problem List  ? Diagnosis Date Noted  ?  Bowel perforation ([place]) 10/22/2021  ? Substance induced mood disorder ([place])   ? Delirium tremens ([place]) 12/04/2017  ? ?s/p Procedure(s): ?EXPLORATORY LAPAROTOMY hartmans 10/22/2021 ? ?- NPO, MIVF ?- G tube to gravity for now; expected ileus based on operative findings yesterday ?- Therapies when able ?- WOCN team for new colostomy ?- Continue IV abx for feculent peritonitis ?- Hx EtOH w/d - CIWA in place ?- Keep in ICU today; appreciate CCMs assistance in her care ? ?- PPx: SCDs; ok for chemical dvt prophylaxis from our perspective ? ? LOS: 1 day  ? ?I spent a total of 40 minutes in both face-to-face and non-face-to-face activities, excluding procedures performed, for this visit on the date of this encounter. ? ?Nadeen Landau, MD FACS ?[facility] ? ?

## 2021-10-23 NOTE — Progress Notes (Signed)
eLink Physician-Brief Progress Note ?Patient Name: Jessica [name] ?DOB: 06/18/1961 ?MRN: [id number] ? ? ?Date of Service ? 10/23/2021  ?HPI/Events of Note ? Called d/t elevated HR. 12 Lead EKG reveals sinus tachycardia, Left atrial enlargement. Anteroseptal infarct , age undetermined. ST & T wave abnormality, consider inferolateral ischemia. Given vasopressor requirements, not able to Beta block.   ?eICU Interventions ? Plan: ?Monitor CVP now and Q 4 hours.  ?Titrate Phyenylephrine IV infusion up for BP support. ?Wean Norepinephrine IV infusion as tolerated.   ? ? ? ?Intervention Category ?Major Interventions: Arrhythmia - evaluation and management ? ?Jessica [name] ?10/23/2021, 9:58 PM ?

## 2021-10-23 NOTE — Progress Notes (Signed)
eLink Physician-Brief Progress Note ?Patient Name: Jessica [name] ?DOB: [birth year] ?MRN: [id number] ? ? ?Date of Service ? 10/23/2021  ?HPI/Events of Note ? CVP = 7-8 and Albumin = 2.2.   ?eICU Interventions ? Plan: ?25% Albumin 12.5 gm IV now. ?0.9 NaCl 500 mL IV now over 30 minutes.   ? ? ? ?Intervention Category ?Major Interventions: Other: ? ?Abbagale Goguen Cornelia Copa ?10/23/2021, 10:24 PM ?

## 2021-10-23 NOTE — Progress Notes (Signed)
Pharmacy Antibiotic Note ? ?ILLYRIA [name] is a 61 y.o. female admitted on 10/22/2021 with septic shock secondary to feculent peritonitis with sigmoid perforation. She underwent exp lap with Hartman's procedure on 10/22/21. She's currently on vancomycin, cefepime and flagyl for broad coverage for sepsis. ? ?Today, 10/23/2021: ?- day #2 abx ?- afeb, wbc 19.1 ?- scr 1.47 (crcl~35), on sodium bicarb drip ? ? ?Plan: ?- continue Cefepime 2g IV q12 hours ?- vancomycin 1000 mg IV x1 now ?- flagyl 500 mg IV q12h ?- will check level at noon on 4/17 and redose if level  < 20 ?_______________________________________________ ?Height: 5' 10.5" (179.1 cm) ?Weight: 53.8 kg (118 lb 9.7 oz) ?IBW/kg (Calculated) : 69.65 ? ?Temp (24hrs), Avg:97 ?F (36.1 ?C), Min:96.1 ?F (35.6 ?C), Max:97.9 ?F (36.6 ?C) ? ?Recent Labs  ?Lab 10/22/21 ?1141 10/22/21 ?2119 10/22/21 ?2222 10/23/21 ?2979 10/23/21 ?1129  ?WBC 15.9*  --   --  19.1*  --   ?CREATININE 1.52* 1.24*  --  1.47*  --   ?LATICACIDVEN >9.0* 3.2* 4.6*  --   --   ?VANCORANDOM  --   --   --   --  11  ? ?  ?Estimated Creatinine Clearance: 34.6 mL/min (A) (by C-G formula based on SCr of 1.47 mg/dL (H)).   ? ?No Known Allergies ? ? ?Thank you for allowing pharmacy to be a part of this patient?s care. ? ?Jessica [name], PharmD, BCPS ?10/23/2021 2:38 PM ? ?

## 2021-10-23 NOTE — Progress Notes (Signed)
Remainder of morphine PCA syringe wasted with RN Norman Clay RN - .64 ML wasted ?

## 2021-10-23 NOTE — Procedures (Signed)
Central Venous Catheter Insertion Procedure Note ? ?Kathi A Hueber  ?[id number]  ?17-Nov-1960 ? ?Date:10/23/21  ?Time:8:37 AM  ? ?Provider Performing:Aliyah Abeyta Merlene Pulling  ? ?Procedure: Insertion of Non-tunneled Central Venous Catheter(36556) with US guidance (28768)  ? ?Indication(s) ?Medication administration ? ?Consent ?Risks of the procedure as well as the alternatives and risks of each were explained to the patient and/or caregiver.  Consent for the procedure was obtained and is signed in the bedside chart ? ?Anesthesia ?Topical only with 1% lidocaine  ? ?Timeout ?Verified patient identification, verified procedure, site/side was marked, verified correct patient position, special equipment/implants available, medications/allergies/relevant history reviewed, required imaging and test results available. ? ?Sterile Technique ?Maximal sterile technique including full sterile barrier drape, hand hygiene, sterile gown, sterile gloves, mask, hair covering, sterile ultrasound probe cover (if used). ? ?Procedure Description ?Area of catheter insertion was cleaned with chlorhexidine and draped in sterile fashion.  With real-time ultrasound guidance a central venous catheter was placed into the right subclavian vein. Nonpulsatile blood flow and easy flushing noted in all ports.  The catheter was sutured in place and sterile dressing applied. ? ? ? ? ? ?Complications/Tolerance ?None; patient tolerated the procedure well. ?Chest X-ray is ordered to verify placement for internal jugular or subclavian cannulation.   Chest x-ray is not ordered for femoral cannulation. ? ?EBL ?Minimal ? ?Specimen(s) ?None ? ?

## 2021-10-24 ENCOUNTER — Encounter: Payer: Self-pay | Admitting: Surgery

## 2021-10-24 ENCOUNTER — Inpatient Hospital Stay (HOSPITAL_COMMUNITY): Payer: Self-pay | Admitting: Certified Registered Nurse Anesthetist

## 2021-10-24 ENCOUNTER — Ambulatory Visit (HOSPITAL_BASED_OUTPATIENT_CLINIC_OR_DEPARTMENT_OTHER): Payer: Self-pay | Admitting: Orthopaedic Surgery

## 2021-10-24 ENCOUNTER — Inpatient Hospital Stay (HOSPITAL_COMMUNITY): Payer: Self-pay

## 2021-10-24 ENCOUNTER — Other Ambulatory Visit: Payer: Self-pay

## 2021-10-24 DIAGNOSIS — M2041 Other hammer toe(s) (acquired), right foot: Secondary | ICD-10-CM | POA: Insufficient documentation

## 2021-10-24 DIAGNOSIS — I10 Essential (primary) hypertension: Secondary | ICD-10-CM | POA: Insufficient documentation

## 2021-10-24 DIAGNOSIS — E43 Unspecified severe protein-calorie malnutrition: Secondary | ICD-10-CM | POA: Diagnosis present

## 2021-10-24 DIAGNOSIS — F419 Anxiety disorder, unspecified: Secondary | ICD-10-CM | POA: Insufficient documentation

## 2021-10-24 DIAGNOSIS — R682 Dry mouth, unspecified: Secondary | ICD-10-CM

## 2021-10-24 DIAGNOSIS — Z720 Tobacco use: Secondary | ICD-10-CM | POA: Insufficient documentation

## 2021-10-24 DIAGNOSIS — G8929 Other chronic pain: Secondary | ICD-10-CM | POA: Insufficient documentation

## 2021-10-24 DIAGNOSIS — F17200 Nicotine dependence, unspecified, uncomplicated: Secondary | ICD-10-CM | POA: Insufficient documentation

## 2021-10-24 DIAGNOSIS — J309 Allergic rhinitis, unspecified: Secondary | ICD-10-CM | POA: Insufficient documentation

## 2021-10-24 DIAGNOSIS — F5101 Primary insomnia: Secondary | ICD-10-CM | POA: Insufficient documentation

## 2021-10-24 DIAGNOSIS — E440 Moderate protein-calorie malnutrition: Secondary | ICD-10-CM | POA: Insufficient documentation

## 2021-10-24 DIAGNOSIS — R636 Underweight: Secondary | ICD-10-CM | POA: Insufficient documentation

## 2021-10-24 DIAGNOSIS — L509 Urticaria, unspecified: Secondary | ICD-10-CM | POA: Insufficient documentation

## 2021-10-24 LAB — CBC WITH DIFFERENTIAL/PLATELET
Abs Immature Granulocytes: 0.47 10*3/uL — ABNORMAL HIGH (ref 0.00–0.07)
Basophils Absolute: 0 10*3/uL (ref 0.0–0.1)
Basophils Relative: 0 %
Eosinophils Absolute: 0 10*3/uL (ref 0.0–0.5)
Eosinophils Relative: 0 %
HCT: 26.7 % — ABNORMAL LOW (ref 36.0–46.0)
Hemoglobin: 9.1 g/dL — ABNORMAL LOW (ref 12.0–15.0)
Immature Granulocytes: 2 %
Lymphocytes Relative: 3 %
Lymphs Abs: 0.8 10*3/uL (ref 0.7–4.0)
MCH: 31.9 pg (ref 26.0–34.0)
MCHC: 34.1 g/dL (ref 30.0–36.0)
MCV: 93.7 fL (ref 80.0–100.0)
Monocytes Absolute: 0.5 10*3/uL (ref 0.1–1.0)
Monocytes Relative: 2 %
Neutro Abs: 28.4 10*3/uL — ABNORMAL HIGH (ref 1.7–7.7)
Neutrophils Relative %: 93 %
Platelets: 138 10*3/uL — ABNORMAL LOW (ref 150–400)
RBC: 2.85 MIL/uL — ABNORMAL LOW (ref 3.87–5.11)
RDW: 13.1 % (ref 11.5–15.5)
WBC: 30.3 10*3/uL — ABNORMAL HIGH (ref 4.0–10.5)
nRBC: 0 % (ref 0.0–0.2)

## 2021-10-24 LAB — COMPREHENSIVE METABOLIC PANEL
ALT: 59 U/L — ABNORMAL HIGH (ref 0–44)
AST: 140 U/L — ABNORMAL HIGH (ref 15–41)
Albumin: 2.4 g/dL — ABNORMAL LOW (ref 3.5–5.0)
Alkaline Phosphatase: 28 U/L — ABNORMAL LOW (ref 38–126)
Anion gap: 8 (ref 5–15)
BUN: 43 mg/dL — ABNORMAL HIGH (ref 6–20)
CO2: 19 mmol/L — ABNORMAL LOW (ref 22–32)
Calcium: 6.4 mg/dL — CL (ref 8.9–10.3)
Chloride: 101 mmol/L (ref 98–111)
Creatinine, Ser: 1.91 mg/dL — ABNORMAL HIGH (ref 0.44–1.00)
GFR, Estimated: 30 mL/min — ABNORMAL LOW (ref >60)
Glucose, Bld: 136 mg/dL — ABNORMAL HIGH (ref 70–99)
Potassium: 4.3 mmol/L (ref 3.5–5.1)
Sodium: 128 mmol/L — ABNORMAL LOW (ref 135–145)
Total Bilirubin: 1.2 mg/dL (ref 0.3–1.2)
Total Protein: 4.4 g/dL — ABNORMAL LOW (ref 6.5–8.1)

## 2021-10-24 LAB — BLOOD GAS, ARTERIAL
Acid-base deficit: 11.8 mmol/L — ABNORMAL HIGH (ref 0.0–2.0)
Acid-base deficit: 7.1 mmol/L — ABNORMAL HIGH (ref 0.0–2.0)
Bicarbonate: 17.6 mmol/L — ABNORMAL LOW (ref 20.0–28.0)
Bicarbonate: 17.9 mmol/L — ABNORMAL LOW (ref 20.0–28.0)
O2 Saturation: 100 %
O2 Saturation: 100 %
Patient temperature: 36.4
Patient temperature: 36.5
pCO2 arterial: 33 mmHg (ref 32–48)
pCO2 arterial: 52 mmHg — ABNORMAL HIGH (ref 32–48)
pH, Arterial: 7.14 — CL (ref 7.35–7.45)
pH, Arterial: 7.34 — ABNORMAL LOW (ref 7.35–7.45)
pO2, Arterial: 253 mmHg — ABNORMAL HIGH (ref 83–108)
pO2, Arterial: 86 mmHg (ref 83–108)

## 2021-10-24 LAB — GLUCOSE, CAPILLARY
Glucose-Capillary: 132 mg/dL — ABNORMAL HIGH (ref 70–99)
Glucose-Capillary: 101 mg/dL — ABNORMAL HIGH (ref 70–99)
Glucose-Capillary: 93 mg/dL (ref 70–99)
Glucose-Capillary: 130 mg/dL — ABNORMAL HIGH (ref 70–99)
Glucose-Capillary: 111 mg/dL — ABNORMAL HIGH (ref 70–99)
Glucose-Capillary: 121 mg/dL — ABNORMAL HIGH (ref 70–99)

## 2021-10-24 LAB — BLOOD CULTURE ID PANEL (REFLEXED) - BCID2

## 2021-10-24 MED ORDER — PIPERACILLIN-TAZOBACTAM 3.375 G IVPB
3.3750 g | Freq: Three times a day (TID) | INTRAVENOUS | Status: DC
  Administered 2021-10-24 – 2021-10-27 (×9): 3.375 g via INTRAVENOUS
  Filled 2021-10-24 (×9): qty 50

## 2021-10-24 MED ORDER — ORAL CARE MOUTH RINSE
15.0000 mL | OROMUCOSAL | Status: DC
  Administered 2021-10-24 – 2021-10-25 (×10): 15 mL via OROMUCOSAL

## 2021-10-24 MED ORDER — VITAL AF 1.2 CAL PO LIQD
1000.0000 mL | ORAL | Status: DC
  Administered 2021-10-24: 1000 mL

## 2021-10-24 MED ORDER — SODIUM BICARBONATE 8.4 % IV SOLN
100.0000 meq | Freq: Once | INTRAVENOUS | Status: AC
  Administered 2021-10-24: 100 meq via INTRAVENOUS
  Filled 2021-10-24: qty 50

## 2021-10-24 MED ORDER — INSULIN ASPART 100 UNIT/ML IJ SOLN
0.0000 [IU] | INTRAMUSCULAR | Status: DC
  Administered 2021-10-24 – 2021-10-27 (×6): 1 [IU] via SUBCUTANEOUS

## 2021-10-24 MED ORDER — PHENYLEPHRINE CONCENTRATED 100MG/250ML (0.4 MG/ML) INFUSION SIMPLE
25.0000 ug/min | INTRAVENOUS | Status: DC
  Administered 2021-10-24: 40 ug/min via INTRAVENOUS
  Administered 2021-10-25: 75 ug/min via INTRAVENOUS
  Filled 2021-10-24 (×2): qty 250

## 2021-10-24 MED ORDER — PHENYLEPHRINE 40 MCG/ML (10ML) SYRINGE FOR IV PUSH (FOR BLOOD PRESSURE SUPPORT)
PREFILLED_SYRINGE | INTRAVENOUS | Status: AC
  Filled 2021-10-24: qty 10

## 2021-10-24 MED ORDER — CALCIUM GLUCONATE-NACL 2-0.675 GM/100ML-% IV SOLN
2.0000 g | Freq: Once | INTRAVENOUS | Status: AC
  Administered 2021-10-24: 2000 mg via INTRAVENOUS
  Filled 2021-10-24: qty 100

## 2021-10-24 MED ORDER — NOREPINEPHRINE 16 MG/250ML-% IV SOLN
0.0000 ug/min | INTRAVENOUS | Status: DC
  Administered 2021-10-24: 30 ug/min via INTRAVENOUS
  Administered 2021-10-25: 35 ug/min via INTRAVENOUS
  Administered 2021-10-25: 24 ug/min via INTRAVENOUS
  Administered 2021-10-26: 20 ug/min via INTRAVENOUS
  Administered 2021-10-26 – 2021-10-27 (×2): 17 ug/min via INTRAVENOUS
  Administered 2021-10-28: 10 ug/min via INTRAVENOUS
  Filled 2021-10-24 (×7): qty 250

## 2021-10-24 MED ORDER — PHENYLEPHRINE 40 MCG/ML (10ML) SYRINGE FOR IV PUSH (FOR BLOOD PRESSURE SUPPORT)
PREFILLED_SYRINGE | INTRAVENOUS | Status: DC | PRN
  Administered 2021-10-24: 100 ug via INTRAVENOUS

## 2021-10-24 MED ORDER — CHLORHEXIDINE GLUCONATE 0.12% ORAL RINSE (MEDLINE KIT)
15.0000 mL | Freq: Two times a day (BID) | OROMUCOSAL | Status: DC
  Administered 2021-10-24 (×2): 15 mL via OROMUCOSAL

## 2021-10-24 MED ORDER — ETOMIDATE 2 MG/ML IV SOLN
INTRAVENOUS | Status: DC | PRN
  Administered 2021-10-24: 10 mg via INTRAVENOUS

## 2021-10-24 MED ORDER — SUCCINYLCHOLINE CHLORIDE 200 MG/10ML IV SOSY
PREFILLED_SYRINGE | INTRAVENOUS | Status: AC
  Filled 2021-10-24: qty 10

## 2021-10-24 MED ORDER — FENTANYL CITRATE PF 50 MCG/ML IJ SOSY
25.0000 ug | PREFILLED_SYRINGE | INTRAMUSCULAR | Status: DC | PRN
  Administered 2021-10-24 – 2021-10-25 (×3): 50 ug via INTRAVENOUS
  Filled 2021-10-24 (×2): qty 1

## 2021-10-24 MED ORDER — FOLIC ACID 1 MG PO TABS
1.0000 mg | ORAL_TABLET | Freq: Every day | ORAL | Status: DC
  Administered 2021-10-26: 1 mg
  Filled 2021-10-24: qty 1

## 2021-10-24 MED ORDER — PROSOURCE TF PO LIQD
45.0000 mL | Freq: Two times a day (BID) | ORAL | Status: DC
  Administered 2021-10-24 (×2): 45 mL
  Filled 2021-10-24 (×2): qty 45

## 2021-10-24 MED ORDER — SODIUM CHLORIDE 0.9 % IV SOLN
2.0000 g | INTRAVENOUS | Status: DC
  Administered 2021-10-24: 2 g via INTRAVENOUS
  Filled 2021-10-24: qty 20

## 2021-10-24 MED ORDER — SUCCINYLCHOLINE CHLORIDE 200 MG/10ML IV SOSY
PREFILLED_SYRINGE | INTRAVENOUS | Status: DC | PRN
  Administered 2021-10-24: 80 mg via INTRAVENOUS

## 2021-10-24 MED ORDER — VITAL HIGH PROTEIN PO LIQD: 1000.0000 mL | ORAL | Status: DC

## 2021-10-24 MED ORDER — ETOMIDATE 2 MG/ML IV SOLN
INTRAVENOUS | Status: AC
  Filled 2021-10-24: qty 10

## 2021-10-24 MED ORDER — FENTANYL 2500MCG IN NS 250ML (10MCG/ML) PREMIX INFUSION
0.0000 ug/h | INTRAVENOUS | Status: DC
  Administered 2021-10-24: 25 ug/h via INTRAVENOUS
  Administered 2021-10-26: 50 ug/h via INTRAVENOUS
  Administered 2021-10-27: 200 ug/h via INTRAVENOUS
  Filled 2021-10-24 (×3): qty 250

## 2021-10-24 MED ORDER — DAKINS (1/4 STRENGTH) 0.125 % EX SOLN
Freq: Two times a day (BID) | CUTANEOUS | Status: DC
  Filled 2021-10-24: qty 473

## 2021-10-24 MED ORDER — FOLIC ACID 5 MG/ML IJ SOLN
1.0000 mg | Freq: Every day | INTRAMUSCULAR | Status: DC
  Administered 2021-10-24 – 2021-10-25 (×2): 1 mg via INTRAVENOUS
  Filled 2021-10-24 (×4): qty 0.2

## 2021-10-24 MED ORDER — DEXMEDETOMIDINE HCL IN NACL 200 MCG/50ML IV SOLN
0.4000 ug/kg/h | INTRAVENOUS | Status: DC
  Administered 2021-10-24: 0.9 ug/kg/h via INTRAVENOUS
  Administered 2021-10-24: 0.4 ug/kg/h via INTRAVENOUS
  Administered 2021-10-24: 1 ug/kg/h via INTRAVENOUS
  Administered 2021-10-24: 0.8 ug/kg/h via INTRAVENOUS
  Administered 2021-10-25: 1 ug/kg/h via INTRAVENOUS
  Administered 2021-10-25: 0.9 ug/kg/h via INTRAVENOUS
  Administered 2021-10-25 (×2): 1 ug/kg/h via INTRAVENOUS
  Administered 2021-10-25: 0.9 ug/kg/h via INTRAVENOUS
  Administered 2021-10-25: 1 ug/kg/h via INTRAVENOUS
  Administered 2021-10-26: 1.1 ug/kg/h via INTRAVENOUS
  Administered 2021-10-26 (×2): 1 ug/kg/h via INTRAVENOUS
  Administered 2021-10-26: 1.1 ug/kg/h via INTRAVENOUS
  Administered 2021-10-26 (×2): 1 ug/kg/h via INTRAVENOUS
  Filled 2021-10-24 (×10): qty 50
  Filled 2021-10-24: qty 100
  Filled 2021-10-24 (×4): qty 50

## 2021-10-24 NOTE — Progress Notes (Signed)
eLink Physician-Brief Progress Note ?Patient Name: Jessica [name] ?DOB: [birth year] ?MRN: [id number] ? ? ?Date of Service ? 10/24/2021  ?HPI/Events of Note ? ABG on 100%/PRVC 18/TV 450/P 5 = 7.14/52/253/17.6  ?eICU Interventions ? Plan: ?NaHCO3 100 meq IV now. ?Increase NaHCO3 IV infusion to 100 mL/hour. ?Increase PRVC rate to 30. ?Wean FiO2 as tolerated.  ?Repeat ABG at 5 AM.  ? ? ? ?Intervention Category ?Major Interventions: Acid-Base disturbance - evaluation and management;Respiratory failure - evaluation and management ? ?Jessica [name] ?10/24/2021, 1:05 AM ?

## 2021-10-24 NOTE — Progress Notes (Addendum)
eLink Physician-Brief Progress Note ?Patient Name: Jessica [name] ?DOB: 09-27-1960 ?MRN: [id number] ? ? ?Date of Service ? 10/24/2021  ?HPI/Events of Note ? Patient obtunded with agonal breathing. PCCM ground team tied up at [facility]. Anesthesia at [facility] called and they have graciously agreed to intubate the patient.   ?eICU Interventions ? Plan: ?Ventilator settings: 100%/PRVC 18/TV 450/P 5. Keep sat >= 92%. ?ABG post intubation. ?Portable CXR post intubation. ?Fentanyl IV infusion. Titrate to RASS = 0 to -1. ?Bilateral soft wrist restraints X 9 hours.   ? ? ? ?Intervention Category ?Major Interventions: Respiratory failure - evaluation and management ? ?[name] Cornelia Copa ?10/24/2021, 12:03 AM ?

## 2021-10-24 NOTE — Progress Notes (Signed)
PHARMACY - PHYSICIAN COMMUNICATION ?CRITICAL VALUE ALERT - BLOOD CULTURE IDENTIFICATION (BCID) ? ?Jessica [name] is an 61 y.o. female who presented to [facility] on 10/22/2021 with a chief complaint of abdominal pain ? ?Assessment:  Admitted with septic shock secondary to feculent peritonitis with sigmoid perforation. She underwent exp lap with Hartman's procedure on 10/22/21. ?She remains hemodynamically unstable requiring intubation overnight.   ?Blood cx growing GNR, BCID + Klebsiella oxytoca, no resistance detected.  ? ?Name of physician (or Provider) Contacted: Levada Schilling ? ?Current antibiotics: Cefepime + Flagyl ? ?Changes to prescribed antibiotics recommended:  ?Consider change Cefepime to Rocephin 2gm IV q24h - will defer decision to rounding physician.  Next dose Cefepime due 2pm.  ?Continue Metronidazole ? ?Results for orders placed or performed during the hospital encounter of 10/22/21  ?Blood Culture ID Panel (Reflexed) (Collected: 10/22/2021 10:21 PM)  ?Result Value Ref Range  ? Enterococcus faecalis NOT DETECTED NOT DETECTED  ? Enterococcus Faecium NOT DETECTED NOT DETECTED  ? Listeria monocytogenes NOT DETECTED NOT DETECTED  ? Staphylococcus species NOT DETECTED NOT DETECTED  ? Staphylococcus aureus (BCID) NOT DETECTED NOT DETECTED  ? Staphylococcus epidermidis NOT DETECTED NOT DETECTED  ? Staphylococcus lugdunensis NOT DETECTED NOT DETECTED  ? Streptococcus species NOT DETECTED NOT DETECTED  ? Streptococcus agalactiae NOT DETECTED NOT DETECTED  ? Streptococcus pneumoniae NOT DETECTED NOT DETECTED  ? Streptococcus pyogenes NOT DETECTED NOT DETECTED  ? A.calcoaceticus-baumannii NOT DETECTED NOT DETECTED  ? Bacteroides fragilis NOT DETECTED NOT DETECTED  ? Enterobacterales DETECTED (A) NOT DETECTED  ? Enterobacter cloacae complex NOT DETECTED NOT DETECTED  ? Escherichia coli NOT DETECTED NOT DETECTED  ? Klebsiella aerogenes NOT DETECTED NOT DETECTED  ? Klebsiella oxytoca DETECTED (A) NOT DETECTED  ?  Klebsiella pneumoniae NOT DETECTED NOT DETECTED  ? Proteus species NOT DETECTED NOT DETECTED  ? Salmonella species NOT DETECTED NOT DETECTED  ? Serratia marcescens NOT DETECTED NOT DETECTED  ? Haemophilus influenzae NOT DETECTED NOT DETECTED  ? Neisseria meningitidis NOT DETECTED NOT DETECTED  ? Pseudomonas aeruginosa NOT DETECTED NOT DETECTED  ? Stenotrophomonas maltophilia NOT DETECTED NOT DETECTED  ? Candida albicans NOT DETECTED NOT DETECTED  ? Candida auris NOT DETECTED NOT DETECTED  ? Candida glabrata NOT DETECTED NOT DETECTED  ? Candida krusei NOT DETECTED NOT DETECTED  ? Candida parapsilosis NOT DETECTED NOT DETECTED  ? Candida tropicalis NOT DETECTED NOT DETECTED  ? Cryptococcus neoformans/gattii NOT DETECTED NOT DETECTED  ? CTX-M ESBL NOT DETECTED NOT DETECTED  ? Carbapenem resistance IMP NOT DETECTED NOT DETECTED  ? Carbapenem resistance KPC NOT DETECTED NOT DETECTED  ? Carbapenem resistance NDM NOT DETECTED NOT DETECTED  ? Carbapenem resist OXA 48 LIKE NOT DETECTED NOT DETECTED  ? Carbapenem resistance VIM NOT DETECTED NOT DETECTED  ? ? ?Netta Cedars PharmD ?10/24/2021  2:29 AM ? ?

## 2021-10-24 NOTE — TOC Initial Note (Signed)
Transition of Care (TOC) - Initial/Assessment Note  ? ? ?Patient Details  ?Name: Jessica [name] ?MRN: [id number] ?Date of Birth: 10/24/1960 ? ?Transition of Care [facility]) CM/SW Contact:    ?Dessa Phi, RN ?Phone Number: ?10/24/2021, 10:12 AM ? ?Clinical Narrative: On vent,ostomy,JP,GT. Monitor for d/c plans.                ? ? ?Expected Discharge Plan: Home/Self Care (TBD) ?Barriers to Discharge: Continued Medical Work up ? ? ?Patient Goals and CMS Choice ?Patient states their goals for this hospitalization and ongoing recovery are:: Home ?CMS Medicare.gov Compare Post Acute Care list provided to:: Patient Represenative (must comment) ?  ? ?Expected Discharge Plan and Services ?Expected Discharge Plan: Home/Self Care (TBD) ?  ?Discharge Planning Services: CM Consult ?  ?Living arrangements for the past 2 months: [place] ?                ?  ?  ?  ?  ?  ?  ?  ?  ?  ?  ? ?Prior Living Arrangements/Services ?Living arrangements for the past 2 months: [place] ?Lives with:: Significant Other ?Patient language and need for interpreter reviewed:: Yes ?Do you feel safe going back to the place where you live?: Yes      ?Need for Family Participation in Patient Care: Yes (Comment) ?Care giver support system in place?: Yes (comment) ?  ?Criminal Activity/Legal Involvement Pertinent to Current Situation/Hospitalization: No - Comment as needed ? ?Activities of Daily Living ?  ?  ? ?Permission Sought/Granted ?Permission sought to share information with : Case Manager ?Permission granted to share information with : Yes, Verbal Permission Granted ? Share Information with NAME: Case Manager ?   ?   ?   ? ?Emotional Assessment ?Appearance:: Appears stated age ?Attitude/Demeanor/Rapport: Gracious ?  ?  ?  ?  ? ?Admission diagnosis:  Bowel perforation ([place]) [K63.1] ?Generalized abdominal pain [R10.84] ?AKI (acute kidney injury) ([place]) [N17.9] ?Patient Active Problem List  ? Diagnosis Date Noted  ? Anxiety 10/24/2021   ? Chronic pain 10/24/2021  ? Allergic rhinitis 10/24/2021  ? 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 Tobacco user 10/24/2021  ? Underweight 10/24/2021  ? Acquired hammer toe of right foot 10/24/2021  ? Essential hypertension 10/24/2021  ? Urticaria 10/24/2021  ? Dry mouth 10/24/2021  ? Perforated sigmoid colon s/p Hartmann/colostomy 10/22/2021 10/22/2021  ? Substance induced mood disorder ([place])   ? Delirium tremens ([place]) 12/04/2017  ? ?PCP:  Deland Pretty, MD ?Pharmacy:   ?[id number] [address] ?[place] ?[place] [address] ?Phone: [phone] Fax: [phone] ? ?[facility], [address] ?[place] [address] ?Phone: [phone] Fax: [phone] ? ?[facility] [address] ?[address] ?[place] [address] ?Phone: [phone] Fax: [phone] ? ? ? ? ?Social Determinants of Health (SDOH) Interventions ?  ? ?Readmission Risk Interventions ? ?  10/24/2021  ? 10:10 AM  ?Readmission Risk Prevention Plan  ?Transportation Screening Complete  ?PCP or Specialist Appt within 3-5 Days Complete  ?[place] or Home Care Consult Complete  ?Social Work Consult for [place] Planning/Counseling Complete  ?Palliative Care Screening Complete  ?Medication Review Press photographer) Complete  ? ? ? ?

## 2021-10-24 NOTE — Anesthesia Procedure Notes (Signed)
Procedure Name: Intubation ?Date/Time: 10/24/2021 12:07 AM ?Performed by: Raenette Rover, CRNA ?Pre-anesthesia Checklist: Patient identified, Emergency Drugs available, Suction available, Patient being monitored and Timeout performed ?Patient Re-evaluated:Patient Re-evaluated prior to induction ?Oxygen Delivery Method: Circle system utilized ?Preoxygenation: Pre-oxygenation with 100% oxygen ?Induction Type: IV induction ?Ventilation: Mask ventilation without difficulty ?Laryngoscope Size: Glidescope and 3 ?Grade View: Grade I ?Tube type: Oral ?Tube size: 7.5 mm ?Number of attempts: 1 ?Airway Equipment and Method: Stylet and Video-laryngoscopy ?Placement Confirmation: ETT inserted through vocal cords under direct vision, breath sounds checked- equal and bilateral and CO2 detector ?Secured at: 22 cm ?Tube secured with: ETT holder per RT. ?Dental Injury: Teeth and Oropharynx as per pre-operative assessment  ? ? ? ? ?

## 2021-10-24 NOTE — Consult Note (Signed)
[place] Nurse ostomy consult note ?Stoma type/location: LLQ, end colostomy ?Stomal assessment/size: sloughing; moist 1 1/2" round, budded  ?Peristomal assessment: intact  ?Treatment options for stomal/peristomal skin: 2" skin barrier ring ?Output liquid brown; sour odor ?Ostomy pouching: 2pc. 2 3/4" pouching system with 2" skin barrier ring ?Education provided: Patient intubated and sedated on the vent in the ICU ?Enrolled patient in [place] program:No ?Extra supplies in the room for staff use.  ? ?[place] Nurse will follow along with you for continued support with ostomy teaching and care ?Jyll Tomaro Liane Comber MSN, RN, [place], [place], [place] ?[phone]  ?

## 2021-10-24 NOTE — Progress Notes (Signed)
Pharmacy Antibiotic Note ? ?Jessica [name] is a 61 y.o. female admitted on 10/22/2021 with septic shock secondary to feculent peritonitis with sigmoid perforation. She underwent exp lap with Hartman's procedure on 10/22/21. ? ?Today, 10/24/2021: ?- day #3 abx ?WBC up to 30.3 Scr up to 1.91, CrCl ~ 27 ml/min ?Tm 99.4 ?4/15 BCx: klebsiella oxytoca, no resistance on BCID ? ? ?Plan: ?Zosyn 3.375 mg IV q8h, infuse each dose over 4 hours ?_______________________________________________ ?Height: 5' 10.5" (179.1 cm) ?Weight: 53.8 kg (118 lb 9.7 oz) ?IBW/kg (Calculated) : 69.65 ? ?Temp (24hrs), Avg:98.2 ?F (36.8 ?C), Min:97.4 ?F (36.3 ?C), Max:99.4 ?F (37.4 ?C) ? ?Recent Labs  ?Lab 10/22/21 ?1141 10/22/21 ?2119 10/22/21 ?2222 10/23/21 ?2924 10/23/21 ?1129 10/24/21 ?0129  ?WBC 15.9*  --   --  19.1*  --  30.3*  ?CREATININE 1.52* 1.24*  --  1.47*  --  1.91*  ?LATICACIDVEN >9.0* 3.2* 4.6*  --   --   --   ?VANCORANDOM  --   --   --   --  11  --   ? ?  ?Estimated Creatinine Clearance: 26.6 mL/min (A) (by C-G formula based on SCr of 1.91 mg/dL (H)).   ? ?No Known Allergies ? ?4/15 Vanc>>4/16 ?4/15 Flagyl >> ?4/15 cefepime >>4/17 ?4/17 CTX x 1 ?4/17 ZEI>> (4/27) ? ?4/16 at 1129 VR = 11 (~24 hrs post dose) --> vanc 1000 mg IV x1 ? ?4/15 BCx: GNR (BCID + Klebsiella oxytoca) ?4/16 MRSA PCR: negative ?4/16 HIV NR ? ?Thank you for allowing pharmacy to be a part of this patient?s care. ? ?Eudelia Bunch, Pharm.D ?10/24/2021 2:24 PM ? ?

## 2021-10-24 NOTE — Progress Notes (Addendum)
?Subjective ?Re-intubated overnight due to obtundation with poor air movement. Arouses to voice on vent. FC to squeeze my hand. ? ?HR 130's ?Systolic pressure 37'T (MAP 62) ?On Levo, neo, HCO3 ?BUN 43 / Cr 1.91 (up from 36/1.47), UOP 75 cc overnight ? ?Objective: ?Vital signs in last 24 hours: ?Temp:  [97.4 ?F (36.3 ?C)-98.8 ?F (37.1 ?C)] 98.8 ?F (37.1 ?C) (04/17 0800) ?Pulse Rate:  [110-150] 123 (04/17 0800) ?Resp:  [12-31] 30 (04/17 0800) ?BP: (90-134)/(15-110) 90/30 (04/17 0400) ?SpO2:  [90 %-100 %] 100 % (04/17 0812) ?Arterial Line BP: (81-138)/(44-72) 107/52 (04/17 0830) ?FiO2 (%):  [40 %-100 %] 40 % (04/17 0812) ?Last BM Date : 10/23/21 ? ?Intake/Output from previous day: ?04/16 0701 - 04/17 0700 ?In: 6224.3 [I.V.:4980.9; IV Piggyback:1243.4] ?Out: 675 [Urine:75; Drains:500; Stool:100] ?Intake/Output this shift: ?Total I/O ?In: 291.1 [I.V.:291.1] ?Out: 335 [Urine:150; Drains:185] ? ?Gen: ill appearing white female on the ventilator ?CV: sinus tach ?Pulm: ventilated respirations FiO2 40%, 5 PEEP ?Abd: Soft ?LLQ ostomy w/ viable edamatous stoma - liquid stool in pouch ?G tube to gravity w/ minimal dark green bile  ?JP SS ?Midline wound w/ fascia in tact there is some necrosis  ? ? ?Ext: SCDs in place ? ?Lab Results: ?CBC  ?Recent Labs  ?  10/23/21 ?0240 10/24/21 ?0129  ?WBC 19.1* 30.3*  ?HGB 11.6* 9.1*  ?HCT 34.5* 26.7*  ?PLT 223 138*  ? ?BMET ?Recent Labs  ?  10/23/21 ?9735 10/24/21 ?0129  ?NA 128* 128*  ?K 4.5 4.3  ?CL 103 101  ?CO2 13* 19*  ?GLUCOSE 85 136*  ?BUN 36* 43*  ?CREATININE 1.47* 1.91*  ?CALCIUM 6.8* 6.4*  ? ?PT/INR ?Recent Labs  ?  10/22/21 ?2119  ?LABPROT 15.7*  ?INR 1.3*  ? ?ABG ?Recent Labs  ?  10/24/21 ?0036 10/24/21 ?0404  ?PHART 7.14* 7.34*  ?HCO3 17.6* 17.9*  ? ? ?Studies/Results: ? ?Anti-infectives: ?Anti-infectives (From admission, onward)  ? ? Start     Dose/Rate Route Frequency Ordered Stop  ? 10/24/21 1000  cefTRIAXone (ROCEPHIN) 2 g in sodium chloride 0.9 % 100 mL IVPB       ? 2  g ?200 mL/hr over 30 Minutes Intravenous Every 24 hours 10/24/21 0747    ? 10/23/21 1530  vancomycin (VANCOCIN) IVPB 1000 mg/200 mL premix       ? 1,000 mg ?200 mL/hr over 60 Minutes Intravenous  Once 10/23/21 1439 10/23/21 1547  ? 10/23/21 0200  ceFEPIme (MAXIPIME) 2 g in sodium chloride 0.9 % 100 mL IVPB  Status:  Discontinued       ? 2 g ?200 mL/hr over 30 Minutes Intravenous Every 12 hours 10/22/21 2032 10/24/21 0747  ? 10/22/21 2200  cefOXitin (MEFOXIN) 2 g in sodium chloride 0.9 % 100 mL IVPB  Status:  Discontinued       ? 2 g ?200 mL/hr over 30 Minutes Intravenous Every 8 hours 10/22/21 1706 10/22/21 2032  ? 10/22/21 1956  vancomycin variable dose per unstable renal function (pharmacist dosing)  Status:  Discontinued       ?  Does not apply See admin instructions 10/22/21 1957 10/23/21 1917  ? 10/22/21 1245  vancomycin (VANCOCIN) IVPB 1000 mg/200 mL premix       ? 1,000 mg ?200 mL/hr over 60 Minutes Intravenous  Once 10/22/21 1241 10/22/21 1402  ? 10/22/21 1245  ceFEPIme (MAXIPIME) 2 g in sodium chloride 0.9 % 100 mL IVPB       ? 2 g ?200 mL/hr over 30  Minutes Intravenous  Once 10/22/21 1241 10/22/21 1321  ? 10/22/21 1230  metroNIDAZOLE (FLAGYL) IVPB 500 mg       ? 500 mg ?100 mL/hr over 60 Minutes Intravenous Every 12 hours 10/22/21 1222    ? ?  ? ? ? ?Assessment/Plan: ?Patient Active Problem List  ? Diagnosis Date Noted  ? Anxiety 10/24/2021  ? Chronic pain 10/24/2021  ? Allergic rhinitis 10/24/2021  ? 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 Tobacco user 10/24/2021  ? Underweight 10/24/2021  ? Acquired hammer toe of right foot 10/24/2021  ? Essential hypertension 10/24/2021  ? Urticaria 10/24/2021  ? Dry mouth 10/24/2021  ? Perforated sigmoid colon s/p Hartmann/colostomy 10/22/2021 10/22/2021  ? Substance induced mood disorder ([place])   ? Delirium tremens ([place]) 12/04/2017  ? ?s/p Procedure(s): ?EXPLORATORY LAPAROTOMY hartmans 10/22/2021 ?- ongoing signs of septic shock, appreciate CCM mgmt  ?- NPO, MIVF ?- G tube has  been low output. Having stool via ostomy. Continue G tube to gravity ?- Therapies when able ?- WOCN team for new colostomy, ordered today ?- TID moist to dry dressing changes; add dakins 1/4 strength for 48 hours   ?- Continue IV abx for feculent peritonitis ?- Hx EtOH w/d - CIWA in place ?- Keep in ICU today; continue foley given low UOP w/ AKI appreciate CCMs assistance in her care  ? ?- PPx: SCDs; ok for chemical dvt prophylaxis from our perspective (plts 130's, monitor) ? ? LOS: 2 days  ? ?I spent a total of 40 minutes in both face-to-face and non-face-to-face activities, excluding procedures performed, for this visit on the date of this encounter. ? ?Obie Dredge, PA-C  ?[facility] ? ?

## 2021-10-24 NOTE — Progress Notes (Addendum)
? ?NAME:  Jessica [name], MRN:  [id number], DOB:  04-02-1961, LOS: 2 ?ADMISSION DATE:  10/22/2021, CONSULTATION DATE:  10/22/21 ?REFERRING MD:  Francia Greaves, CHIEF COMPLAINT:  abdominal pain  ? ?History of Present Illness:  ?[name] with history of alcohol use disorder with prior severe withdrawal/DT requiring ICU admission and precedex, smoking who presented to the ED today for severe diffuse abdominal pain that began the night before admission. She had had constipation for 4d PTA. Endorsed frequent alcohol use. Last drink was day PTA.  ? ?In ED she was found to have pneumoperitoneum and free fluid on CT A/P, severe lactic acidosis. She was started on broad spectrum antibiotics and has been given a total of 5L of crystalloid. Surgery was consulted and she was taken to OR where she underwent ex lap and was found to have perforated sigmoid colon, then had hartman's procedure and insertion of g-tube which was left to gravity drainage, JP bulb in pelvis. She was noted to have feculent peritonitis in the pelvis.  ? ?Pertinent  Medical History  ?Etoh use disorder ?History of DT ?Smoking ? ?Significant Hospital Events: ?Including procedures, antibiotic start and stop dates in addition to other pertinent events   ?10/22/21 ex lap, hartman's procedure, g tube placement. Extubated in OR ?4/17 Reintubated overnight with continued pressor support x2. 11 liters positive thus far with progressive AKI this am  ? ?Interim History / Subjective:  ?Attempts to flicker eyes open to verbal stimuli  ? ?Objective   ?Blood pressure (!) 90/30, pulse (!) 123, temperature (!) 97.4 ?F (36.3 ?C), temperature source Axillary, resp. rate (!) 30, height 5' 10.5" (1.791 m), weight 53.8 kg, SpO2 100 %. ?CVP:  [8 mmHg-9 mmHg] 9 mmHg  ?Vent Mode: PRVC ?FiO2 (%):  [23 %-100 %] 40 % ?Set Rate:  [18 bmp-30 bmp] 30 bmp ?Vt Set:  [450 mL] 450 mL ?PEEP:  [5 cmH20] 5 cmH20 ?Plateau Pressure:  [20 cmH20] 20 cmH20  ? ?Intake/Output Summary (Last 24 hours) at 10/24/2021  0721 ?Last data filed at 10/24/2021 [phone] ?Gross per 24 hour  ?Intake 6224.29 ml  ?Output 675 ml  ?Net 5549.29 ml  ? ? ?Filed Weights  ? 10/22/21 1238 10/22/21 1739  ?Weight: 54 kg 53.8 kg  ? ? ?Examination: ?General: Acute on chronically ill appearing thin middle aged female lying in bed on mechanical ventilation, in NAD ?HEENT: ETT, MM pink/moist, PERRL,  ?Neuro: Attempts to flicker eyes open to verbal stimuli  ?CV: s1s2 regular rate and rhythm, no murmur, rubs, or gallops,  ?PULM:  Clear to ascultation, no increased work of breathing, tolerating vent  ?GI: soft, bowel sounds active in all 4 quadrants, non-tender, non-distended, colostomy to LUQ with brown liquid stool, JP in place to RLQ ?Extremities: warm/dry, no edema  ?Skin: no rashes or lesions ? ?Resolved Hospital Problem list   ? ? ?Assessment & Plan:  ? ?Septic shock due to feculent peritonitis with sigmoid perforation  ?Klebsiella Oxytoca bacteremia  ?-s/p hartman's procedure, g-tube placement 4/15 ?P: ?Remains critically ill in the ICU ?Vent support as below  ?Follow pan culture  ?Continue Flagyl but change Cefepime to Rocephin per pharmacy  ?Aggressive IV hydration ?Continue pressors for MAP< 65, currently requiring Levo and Neo ?Monitor urine output ?Capillary refill ?Advancement of diet per GI  ?Local wound care  ? ?Acute Hypoxic Respiratory Failure  ?-Secondary to above, re-intubated overnight 4/16 ?P: ?Continue ventilator support with lung protective strategies  ?Wean PEEP and FiO2 for sats greater than 90%. ?Head of  bed elevated 30 degrees. ?Plateau pressures less than 30 cm H20.  ?Follow intermittent chest x-ray and ABG.   ?SAT/SBT as tolerated, mentation preclude extubation  ?Ensure adequate pulmonary hygiene  ?Follow cultures  ?VAP bundle in place  ?PAD protocol ? ?Hyponatremia ?-Urine electrolytes won't mean too much after this fluid exposure. Suspect this is chronic. ?NAGMA ?Lactic acidosis  ?P: ?Trend Bmet  ?Supportive care  ?Continue Bicarb  gtt  ? ?Alcohol use ?History of DT ?-Has needed precedex before in ICU. ?P: ?Stop schedule Valium now that patient is intubated  ?Can imitate Precedex drip if needed  ?CIWA scaled  ?Supplement thiamine, folate, and MVI ? ?AKI ?-Creatinine 1.24, GFR 50, by am of 4/17 creatinine up trended to 1.91 GFR 30 ?P: ?Follow renal function  ?Monitor urine output ?Trend Bmet ?Avoid nephrotoxins ?Ensure adequate renal perfusion  ? ?Transaminitis ?-Early shock/sepsis vs alcohol related ?P: ?Avoid hepatotoxins  ?Trend LFTs  ?Supportive care  ? ?Anemia ?P: ?Trend CBC  ?Monitor for signs of bleeding  ?Hgb goal > 7 ? ?Hypercalcinemia ?P: ?Trend  ?Supplement as needed  ? ?Best Practice (right click and "Reselect all SmartList Selections" daily)  ? ?Diet/type: NPO ?DVT prophylaxis: SCD ?GI prophylaxis: PPI ?Lines: N/A ?Foley:  N/A ?Code Status:  full code ?Last date of multidisciplinary goals of care discussion [Discussed with patient at bedside this morning] ? ?Critical care time: 40 minutes  ?Luara Faye D. Harris, NP-C ?[facility] ?Personal contact information can be found on Am[place]  ?10/24/2021, 7:34 AM ? ? ? ? ?  ?

## 2021-10-24 NOTE — Progress Notes (Signed)
eLink Physician-Brief Progress Note ?Patient Name: RUQAYYA VENTRESS ?DOB: 04-22-1961 ?MRN: [id number] ? ? ?Date of Service ? 10/24/2021  ?HPI/Events of Note ? Hypocalcemia - Ca++ = 6.4 which corrects to 7.68 (Low) given Albumin = 2.4.  ?eICU Interventions ? Will replace Ca++.  ? ? ? ?Intervention Category ?Major Interventions: Electrolyte abnormality - evaluation and management ? ?Orphia Mctigue Cornelia Copa ?10/24/2021, 2:18 AM ?

## 2021-10-24 NOTE — Progress Notes (Addendum)
Initial Nutrition Assessment ? ?DOCUMENTATION CODES:  ? ?Non-severe (moderate) malnutrition in context of chronic illness, Underweight ? ?INTERVENTION:  ?- will order Vital AF 1.2 @ 15 ml/hr with 45 ml Prosource TF BID via OGT. ? ?- this regimen will provide 512 kcal, 49 grams protein, and 292 ml free water.  ? ?- goal regimen: Vital AF 1.2 @ 55 ml/hr with 45 ml Prosource TF BID which will provide 1664 kcal, 121 grams protein, and 1070 ml free water. ? ?- will monitor for ability to advance TF rate and/or need for initiation of TPN. ? ?Monitor magnesium, potassium, and phosphorus BID for at least 3 days, MD to replete as needed, as pt is at risk for refeeding syndrome given moderate malnutrition and hx of alcohol abuse with last drink the day PTA. ? ? ?NUTRITION DIAGNOSIS:  ? ?Moderate Malnutrition related to chronic illness (alcohol abuse) as evidenced by moderate fat depletion, moderate muscle depletion, severe muscle depletion. ? ?GOAL:  ? ?Patient will meet greater than or equal to 90% of their needs ? ?MONITOR:  ? ?Vent status, TF tolerance, Labs, Weight trends, Skin ? ?REASON FOR ASSESSMENT:  ? ?Consult ?Enteral/tube feeding initiation and management ? ?ASSESSMENT:  ? ?61 year old female with medical history of tobacco abuse and alcohol use disorder with prior severe withdrawal/DT requiring ICU admission and precede. She presented to the ED due to severe, diffuse abdominal pain that began the night prior and constipation x4 days. She reported frequent alcohol ingestion and last drink was the day PTA. In ED she was found to have pneumoperitoneum and free fluid on CT abdomen/pelvis and severe lactic acidosis. She was started on broad spectrum antibiotics and has been given a total of 5L of crystalloid. Surgery was consulted and she was taken to OR where she underwent ex lap and was found to have perforated sigmoid colon, then had hartman's procedure and insertion of g-tube which was left to gravity drainage, JP  bulb in pelvis. She was noted to have feculent peritonitis in the pelvis. ? ?Patient discussed in rounds and one-on-one with RN. No visitors present at the time of RD visit.  ? ?Patient is POD #2 ex lap with hartman's procedure and insertion of feeding G-tube. She was extubated post-op and re-intubated 4/16 into 4/17 night. OGT in place and CXR from overnight encouraged advancement of OGT by 5-10 cm. ? ?Able to communicate with RN and she has advanced OGT and ordered for KUB/abdominal x-ray to confirm placement prior to TF initiation.  ? ?In rounds it was shared that Surgery team indicated ok to start trickle TF via OGT. Surgery team note from earlier today indicates that G-tube to remain to gravity.  ? ?She has not been seen by a [place] RD at any time in the past. ? ?Weight on 4/15 was 119 lb and PTA the only other documented weights were 138 lb on 01/15/20 at [facility] and 129 lb on 12/11/17 within [facility]. ? ? ? ?Patient is currently intubated on ventilator support ?MV: 13.6 L/min ?Temp (24hrs), Avg:98.2 ?F (36.8 ?C), Min:97.4 ?F (36.3 ?C), Max:99.4 ?F (37.4 ?C) ?Propofol: none ? ?Labs reviewed; CBGs: 132, 101, 93 mg/dl, Na: 128 mmol/l, BUN: 43 mg/dl, creatinine: 1.91 mg/dl, Ca: 6.4 mg/dl, LFTs elevated, GFR: 30 ml/min. ? ?Medications reviewed; 2 g Ca gluconate x1 run 1/61, 1 mg folic acid/day, sliding scale novolog, 1 tablet multivitamin with minerals per OGT/day, 100 mg thiamine/day started 4/15. ? ?IVF; D5-150 mEq sodium bicarb @ 100  ml/hr (408 kcal/24 hrs). ? ?Drips; precedex @ 0.6 mcg/kg/hr, neo @ 55 mcg/min, levo @ 30 mcg/min. ? ? ?NUTRITION - FOCUSED PHYSICAL EXAM: ? ?Flowsheet Row Most Recent Value  ?Orbital Region Severe depletion  ?Upper Arm Region Moderate depletion  ?Thoracic and Lumbar Region Unable to assess  ?Buccal Region Unable to assess  [ETT holder]  ?Temple Region Severe depletion  ?Clavicle Bone Region Moderate depletion  ?Clavicle and Acromion Bone Region Severe depletion  ?Scapular  Bone Region Severe depletion  ?Dorsal Hand Moderate depletion  ?Patellar Region No depletion  ?Anterior Thigh Region No depletion  ?Posterior Calf Region No depletion  ?Edema (RD Assessment) Mild  [BLE]  ?Hair Reviewed  ?Eyes Unable to assess  ?Mouth Reviewed  ?Skin Reviewed  ?Nails Reviewed  ? ?  ? ? ?Diet Order:   ?Diet Order   ? ?       ?  Diet NPO time specified  Diet effective now       ?  ? ?  ?  ? ?  ? ? ?EDUCATION NEEDS:  ? ?Not appropriate for education at this time ? ?Skin:  Skin Assessment: Skin Integrity Issues: ?Skin Integrity Issues:: Incisions ?Incisions: abdomen (4/15) ? ?Last BM:  4/17 (100 ml from colostomy) ? ?Height:  ? ?Ht Readings from Last 1 Encounters:  ?10/22/21 5' 10.5" (1.791 m)  ? ? ?Weight:  ? ?Wt Readings from Last 1 Encounters:  ?10/22/21 53.8 kg  ? ? ?BMI:  Body mass index is 16.78 kg/m?. ? ?Estimated Nutritional Needs:  ?Kcal:  1608 kcal ?Protein:  108-134 grams ?Fluid:  >/= 2 L/day ? ? ? ? ?Jarome Matin, MS, RD, LDN ?Registered Dietitian II ?Inpatient Clinical Nutrition ?RD pager # and on-call/weekend pager # available in [place]  ? ?

## 2021-10-25 DIAGNOSIS — N179 Acute kidney failure, unspecified: Secondary | ICD-10-CM | POA: Diagnosis present

## 2021-10-25 LAB — SURGICAL PATHOLOGY

## 2021-10-25 LAB — GLUCOSE, CAPILLARY
Glucose-Capillary: 121 mg/dL — ABNORMAL HIGH (ref 70–99)
Glucose-Capillary: 108 mg/dL — ABNORMAL HIGH (ref 70–99)
Glucose-Capillary: 100 mg/dL — ABNORMAL HIGH (ref 70–99)
Glucose-Capillary: 86 mg/dL (ref 70–99)
Glucose-Capillary: 89 mg/dL (ref 70–99)

## 2021-10-25 LAB — CBC WITH DIFFERENTIAL/PLATELET
Abs Immature Granulocytes: 1.75 10*3/uL — ABNORMAL HIGH (ref 0.00–0.07)
Basophils Absolute: 0 10*3/uL (ref 0.0–0.1)
Basophils Relative: 0 %
Eosinophils Absolute: 0 10*3/uL (ref 0.0–0.5)
Eosinophils Relative: 0 %
HCT: 25.4 % — ABNORMAL LOW (ref 36.0–46.0)
Hemoglobin: 9.3 g/dL — ABNORMAL LOW (ref 12.0–15.0)
Immature Granulocytes: 8 %
Lymphocytes Relative: 7 %
Lymphs Abs: 1.6 10*3/uL (ref 0.7–4.0)
MCH: 31.2 pg (ref 26.0–34.0)
MCHC: 36.6 g/dL — ABNORMAL HIGH (ref 30.0–36.0)
MCV: 85.2 fL (ref 80.0–100.0)
Monocytes Absolute: 0.4 10*3/uL (ref 0.1–1.0)
Monocytes Relative: 2 %
Neutro Abs: 18.5 10*3/uL — ABNORMAL HIGH (ref 1.7–7.7)
Neutrophils Relative %: 83 %
Platelets: 74 10*3/uL — ABNORMAL LOW (ref 150–400)
RBC: 2.98 MIL/uL — ABNORMAL LOW (ref 3.87–5.11)
RDW: 12.3 % (ref 11.5–15.5)
WBC: 22.3 10*3/uL — ABNORMAL HIGH (ref 4.0–10.5)
nRBC: 0 % (ref 0.0–0.2)

## 2021-10-25 LAB — BLOOD GAS, ARTERIAL
Acid-Base Excess: 3.7 mmol/L — ABNORMAL HIGH (ref 0.0–2.0)
Bicarbonate: 24.7 mmol/L (ref 20.0–28.0)
Drawn by: 23281
MECHVT: 550 mL
O2 Saturation: 99.9 %
PEEP: 5 cmH2O
Patient temperature: 37.3
RATE: 25 resp/min
pCO2 arterial: 27 mmHg — ABNORMAL LOW (ref 32–48)
pH, Arterial: 7.57 — ABNORMAL HIGH (ref 7.35–7.45)
pO2, Arterial: 95 mmHg (ref 83–108)

## 2021-10-25 LAB — COMPREHENSIVE METABOLIC PANEL
ALT: 47 U/L — ABNORMAL HIGH (ref 0–44)
AST: 75 U/L — ABNORMAL HIGH (ref 15–41)
Albumin: 1.8 g/dL — ABNORMAL LOW (ref 3.5–5.0)
Alkaline Phosphatase: 37 U/L — ABNORMAL LOW (ref 38–126)
Anion gap: 12 (ref 5–15)
BUN: 44 mg/dL — ABNORMAL HIGH (ref 6–20)
CO2: 24 mmol/L (ref 22–32)
Calcium: 6.5 mg/dL — ABNORMAL LOW (ref 8.9–10.3)
Chloride: 93 mmol/L — ABNORMAL LOW (ref 98–111)
Creatinine, Ser: 1.9 mg/dL — ABNORMAL HIGH (ref 0.44–1.00)
GFR, Estimated: 30 mL/min — ABNORMAL LOW (ref >60)
Glucose, Bld: 136 mg/dL — ABNORMAL HIGH (ref 70–99)
Potassium: 3.3 mmol/L — ABNORMAL LOW (ref 3.5–5.1)
Sodium: 129 mmol/L — ABNORMAL LOW (ref 135–145)
Total Bilirubin: 0.6 mg/dL (ref 0.3–1.2)
Total Protein: 4.1 g/dL — ABNORMAL LOW (ref 6.5–8.1)

## 2021-10-25 MED ORDER — CHLORHEXIDINE GLUCONATE 0.12% ORAL RINSE (MEDLINE KIT)
15.0000 mL | Freq: Two times a day (BID) | OROMUCOSAL | Status: DC
  Administered 2021-10-25 – 2021-10-27 (×5): 15 mL via OROMUCOSAL

## 2021-10-25 MED ORDER — CALCIUM GLUCONATE-NACL 2-0.675 GM/100ML-% IV SOLN
2.0000 g | Freq: Once | INTRAVENOUS | Status: AC
  Administered 2021-10-25: 2000 mg via INTRAVENOUS
  Filled 2021-10-25: qty 100

## 2021-10-25 MED ORDER — ORAL CARE MOUTH RINSE
15.0000 mL | OROMUCOSAL | Status: DC
  Administered 2021-10-25 – 2021-10-27 (×23): 15 mL via OROMUCOSAL

## 2021-10-25 MED ORDER — POTASSIUM CHLORIDE 10 MEQ/100ML IV SOLN
10.0000 meq | INTRAVENOUS | Status: AC
  Administered 2021-10-25 (×4): 10 meq via INTRAVENOUS
  Filled 2021-10-25 (×4): qty 100

## 2021-10-25 MED ORDER — FENTANYL BOLUS VIA INFUSION
25.0000 ug | INTRAVENOUS | Status: DC | PRN
  Administered 2021-10-25 – 2021-10-26 (×7): 50 ug via INTRAVENOUS
  Filled 2021-10-25: qty 50

## 2021-10-25 MED ORDER — DEXTROSE 10 % IV SOLN: INTRAVENOUS | Status: AC

## 2021-10-25 NOTE — Progress Notes (Addendum)
Nutrition Follow-up ? ?DOCUMENTATION CODES:  ? ?Non-severe (moderate) malnutrition in context of chronic illness, Underweight ? ?INTERVENTION:  ?- monitor for plans concerning nutrition support. ? ? ?NUTRITION DIAGNOSIS:  ? ?Moderate Malnutrition related to chronic illness (alcohol abuse) as evidenced by moderate fat depletion, moderate muscle depletion, severe muscle depletion. -ongoing ? ?GOAL:  ? ?Patient will meet greater than or equal to 90% of their needs -unable to meet ? ?MONITOR:  ? ?Vent status, Labs, Weight trends, I & O's, Other (Comment) (plans concerning nutrition support) ? ?ASSESSMENT:  ? ?61 year old female with medical history of tobacco abuse and alcohol use disorder with prior severe withdrawal/DT requiring ICU admission and precede. She presented to the ED due to severe, diffuse abdominal pain that began the night prior and constipation x4 days. She reported frequent alcohol ingestion and last drink was the day PTA. In ED she was found to have pneumoperitoneum and free fluid on CT abdomen/pelvis and severe lactic acidosis. She was started on broad spectrum antibiotics and has been given a total of 5L of crystalloid. Surgery was consulted and she was taken to OR where she underwent ex lap and was found to have perforated sigmoid colon, then had hartman's procedure and insertion of g-tube which was left to gravity drainage, JP bulb in pelvis. She was noted to have feculent peritonitis in the pelvis. ? ?Reviewed notes early this AM. Discussed patient with RN. Surgery team does not want TF at this time. Will monitor for plan concerning nutrition support moving forward. RD removed order for 45 ml [employer]source TF BID via OGT early this morning. ? ?Patient remains intubated. OGT has become dislodged. G-tube to LIWS now; had 425 ml output when to gravity for night shift and 350 ml until 1000 today, prior to changing from gravity to LIWS.  ? ?Surgery team note today indicates clinical picture consistent  with ileus. Indicates patient at high risk for post-op abscess.  ? ?No visitors present at the time of RD visit.  ? ?Patient is POD #. ? ?Weight remains stable. No information documented in the edema section of flow sheet.  ? ? ? ?Patient is currently intubated on ventilator support ?MV: 9.7 L/min ?Temp (24hrs), Avg:99.6 ?F (37.6 ?C), Min:98.3 ?F (36.8 ?C), Max:100.6 ?F (38.1 ?C) ?Propofol: none ?BP: 130/60 and MAP (83) ? ?Labs reviewed; CBGs: 121, 108, 100 mg/dl, Na: 129 mmol/l, K: 3.3 mmol/l, BUN: 44 mg/dl, creatinine: 1.9 mg/dl, Ca: 6.5 mg/dl, GFR: 30 ml/min. ? ?Medications reviewed; 1 mg folvite/day, sliding scale novolog, tablet multivitamin with minerals per tube/day, 10 mEq IV KCl x4 runs 4/18, 100 mg thiamine/day. ? ?Drips; levo @ 24 mcg/min, precedex @ 1 mcg/kg/hr, fentanyl @ 75 mcg/hr. ? ? ?Diet Order:   ?Diet Order   ? ?       ?  Diet NPO time specified  Diet effective now       ?  ? ?  ?  ? ?  ? ? ?EDUCATION NEEDS:  ? ?Not appropriate for education at this time ? ?Skin:  Skin Assessment: Skin Integrity Issues: ?Skin Integrity Issues:: Incisions ?Incisions: abdomen (4/15) ? ?Last BM:  4/18 (25 ml type 6 via colostomy) ? ?Height:  ? ?Ht Readings from Last 1 Encounters:  ?10/24/21 5' 10.5" (1.791 m)  ? ? ?Weight:  ? ?Wt Readings from Last 1 Encounters:  ?10/25/21 53.5 kg  ? ? ? ?BMI:  Body mass index is 16.68 kg/m?. ? ?Estimated Nutritional Needs:  ?Kcal:  1601 kcal ?Protein:  108-134 grams ?Fluid:  >/= 2 L/day ? ? ? ? ?Jarome Matin, MS, RD, LDN ?Registered Dietitian II ?Inpatient Clinical Nutrition ?RD pager # and on-call/weekend pager # available in [place]  ? ?

## 2021-10-25 NOTE — Progress Notes (Addendum)
? ?NAME:  Jessica [name], MRN:  [id number], DOB:  07-Jul-1961, LOS: 3 ?ADMISSION DATE:  10/22/2021, CONSULTATION DATE:  10/22/21 ?REFERRING MD:  Francia Greaves, CHIEF COMPLAINT:  abdominal pain  ? ?History of Present Illness:  ?[name] with history of alcohol use disorder with prior severe withdrawal/DT requiring ICU admission and precedex, smoking who presented to the ED today for severe diffuse abdominal pain that began the night before admission. She had had constipation for 4d PTA. Endorsed frequent alcohol use. Last drink was day PTA.  ? ?In ED she was found to have pneumoperitoneum and free fluid on CT A/P, severe lactic acidosis. She was started on broad spectrum antibiotics and has been given a total of 5L of crystalloid. Surgery was consulted and she was taken to OR where she underwent ex lap and was found to have perforated sigmoid colon, then had hartman's procedure and insertion of g-tube which was left to gravity drainage, JP bulb in pelvis. She was noted to have feculent peritonitis in the pelvis.  ? ?Pertinent  Medical History  ?Etoh use disorder ?History of DT ?Smoking ? ?Significant Hospital Events: ?Including procedures, antibiotic start and stop dates in addition to other pertinent events   ?10/22/21 ex lap, hartman's procedure, g tube placement. Extubated in OR ?4/17 Reintubated overnight with continued pressor support x2. 11 liters positive thus far with progressive AKI this am  ?4/18 Continued issues with electrolyte abnormalities overnight, remains on levo and neo. She is now 14L positive but urine output has increased  ? ?Interim History / Subjective:  ?Slight spontaneous movements seen, sedated  ? ?Objective   ?Blood pressure 118/65, pulse (!) 121, temperature 98.3 ?F (36.8 ?C), temperature source Axillary, resp. rate (!) 25, height 5' 10.5" (1.791 m), weight 53.5 kg, SpO2 100 %. ?CVP:  [8 mmHg-12 mmHg] 8 mmHg  ?Vent Mode: PRVC ?FiO2 (%):  [30 %-40 %] 30 % ?Set Rate:  [25 bmp-30 bmp] 25 bmp ?Vt Set:  [450  mL-550 mL] 550 mL ?PEEP:  [5 cmH20] 5 cmH20 ?Plateau Pressure:  [5 cmH20-19 cmH20] 18 cmH20  ? ?Intake/Output Summary (Last 24 hours) at 10/25/2021 0706 ?Last data filed at 10/25/2021 2119 ?Gross per 24 hour  ?Intake 4690.68 ml  ?Output 2165 ml  ?Net 2525.68 ml  ? ? ?Filed Weights  ? 10/22/21 1238 10/22/21 1739 10/25/21 0500  ?Weight: 54 kg 53.8 kg 53.5 kg  ? ? ?Examination: ?General: Acute on chronically ill appearing middle aged female lying in bed on mechanical ventilation, in NAD ?HEENT: ETT, MM pink/moist, PERRL,  ?Neuro: Sedated on vent  ?CV: s1s2 regular rate and rhythm, no murmur, rubs, or gallops,  ?PULM:  Tolerating vent well, no increased work of breathing, no added breath sounds  ?GI: soft, bowel sounds hypoactive in all 4 quadrants, LUQ colostomy, JPx2 in place, non-tender, non-distended,  ?Extremities: warm/dry, generalized non-pitting edema  ?Skin: no rashes or lesions ? ?Resolved Hospital Problem list   ?[place] ?Lactic acidosis  ? ?Assessment & Plan:  ? ?Septic shock due to feculent peritonitis with sigmoid perforation  ?Klebsiella Oxytoca bacteremia  ?-s/p hartman's procedure, g-tube placement 4/15 ?P: ?Vent support as below  ?Blood cultures positive as above  ?Per surgery antibiotics changed to zosyn  ?Continue pressors for MAP goal > 65, currently requiring levo and neo (pressor requirement decreasing)   ?Monitor urine output  ?Diet advancement per GI ?Local wound care  ? ?Acute Hypoxic Respiratory Failure  ?-Secondary to above, re-intubated overnight 4/16 ?P: ?Continue ventilator support with lung protective strategies  ?  Wean PEEP and FiO2 for sats greater than 90%. ?Head of bed elevated 30 degrees. ?Plateau pressures less than 30 cm H20.  ?Follow intermittent chest x-ray and ABG.   ?SAT/SBT as tolerated, mentation preclude extubation  ?Ensure adequate pulmonary hygiene  ?Follow cultures  ?VAP bundle in place  ?PAD protocol ? ?Hyponatremia - improving  ?-Urine electrolytes won't mean too much after  this fluid exposure. Suspect this is chronic. ?P: ?Trend bmet  ?Supportive care  ?Stop bicarb drip  ? ?Alcohol use ?History of DT ?-Has needed precedex before in ICU. ?P: ?Continue Precedex drip  ?CIA scales ?Supplement thiamine, folate, and MVI ? ?AKI ?-Creatinine 1.24, GFR 50, by am of 4/17 creatinine up trended to 1.91 GFR 30 ?P: ?Urine output increased  ?Follow renal function  ?Monitor urine output ?Trend Bmet ?Avoid nephrotoxins ?Ensure adequate renal perfusion  ? ?Transaminitis - improving  ?-Early shock/sepsis vs alcohol related ?P: ?Avoid hepatotoxins  ?Trend LFTs ?Supportive care  ? ?Anemia ?P: ?Trend CBC  ?Monitor for signs of bleeding ?Hgb goal > 7 ? ?Hypercalcinemia ?P: ?Trend bmet  ?Supplement as needed  ? ?Best Practice (right click and "Reselect all SmartList Selections" daily)  ? ?Diet/type: NPO ?DVT prophylaxis: SCD ?GI prophylaxis: PPI ?Lines: N/A ?Foley:  N/A ?Code Status:  full code ?Last date of multidisciplinary goals of care discussion: Continue to update family daily  ? ?Critical care time: 37 minutes  ?Charmeka Freeburg D. Harris, NP-C ?[facility] ?Personal contact information can be found on Amion  ?10/25/2021, 7:06 AM ? ? ? ? ?  ?

## 2021-10-25 NOTE — Progress Notes (Signed)
eLink Physician-Brief Progress Note ?Patient Name: Jessica [name] ?DOB: 05-Mar-1961 ?MRN: [id number] ? ? ?Date of Service ? 10/25/2021  ?HPI/Events of Note ? Multiple issues: 1. Hypokalemia - K+ = 3.3 and Creatinine = 1.9. 2. Hypocalcemia - Ca++ = 6.5.  ?eICU Interventions ? Plan: ?Will replace K+ and Ca++.  ? ? ? ?Intervention Category ?Major Interventions: Electrolyte abnormality - evaluation and management ? ?Alaa Mullally Cornelia Copa ?10/25/2021, 6:37 AM ?

## 2021-10-25 NOTE — Progress Notes (Signed)
RT attempted x2 to place left radial aline but unsuccessful, unable to thread catheter.  RN aware. ?

## 2021-10-25 NOTE — Progress Notes (Signed)
eLink Physician-Brief Progress Note ?Patient Name: Jessica [name] ?DOB: [birth year] ?MRN: [id number] ? ? ?Date of Service ? 10/25/2021  ?HPI/Events of Note ? RT reports patient air trapping on PRVC rate = 30.  ?eICU Interventions ? Plan: ?Decrease PRVC rate to 25 and increase TV to 550 (8 mL/kg). ?Repeat ABG at 5 AM  ? ? ? ?Intervention Category ?Major Interventions: Respiratory failure - evaluation and management ? ?Samirah Scarpati Cornelia Copa ?10/25/2021, 12:36 AM ?

## 2021-10-25 NOTE — Progress Notes (Signed)
eLink Physician-Brief Progress Note ?Patient Name: Jessica [name] ?DOB: [birth year] ?MRN: [id number] ? ? ?Date of Service ? 10/25/2021  ?HPI/Events of Note ? A-line non functional and was removed. Nursing request to replace A-line as patient is on Vasopressor and NaHCO3 IV infusion. Also patient is difficult stick for lab studies.   ?eICU Interventions ? Plan: ?RT to replace A-line.   ? ? ? ?Intervention Category ?Major Interventions: Hypotension - evaluation and management ? ?Matsue Strom Cornelia Copa ?10/25/2021, 4:30 AM ?

## 2021-10-25 NOTE — Progress Notes (Addendum)
?Subjective ?Intubated/sedated. ?NAEO ? ? ?Objective: ?Vital signs in last 24 hours: ?Temp:  [98.3 ?F (36.8 ?C)-100.6 ?F (38.1 ?C)] 100.6 ?F (38.1 ?C) (04/18 0754) ?Pulse Rate:  [102-133] 109 (04/18 1045) ?Resp:  [18-30] 18 (04/18 1045) ?BP: (80-167)/(33-82) 142/54 (04/18 1045) ?SpO2:  [95 %-100 %] 98 % (04/18 1045) ?Arterial Line BP: (61-154)/(47-120) 121/79 (04/18 0250) ?FiO2 (%):  [30 %-40 %] 30 % (04/18 0815) ?Weight:  [53.5 kg] 53.5 kg (04/18 0500) ?Last BM Date : 10/25/21 ? ?Intake/Output from previous day: ?04/17 0701 - 04/18 0700 ?In: 4624.6 [I.V.:4168.9; NG/GT:231.3; IV Piggyback:224.5] ?Out: 2165 [Urine:1065; Drains:975; Stool:125] ?Intake/Output this shift: ?Total I/O ?In: 657.6 [I.V.:328.5; IV Piggyback:329.1] ?Out: 945 [Urine:575; VEHMCN:470] ? ?Gen: ill appearing white female on the ventilator ?CV: sinus tach ?Pulm: ventilated respirations FiO2 40%, 5 PEEP ?Abd: Soft ?LLQ ostomy w/ viable edamatous stoma, SS Drainage in pouch ?G tube to gravity w/ >800cc bile; OG fell out in last few hours so G tube will be placed to LIWS. ?JP SS ?Midline wound w/ fascia in tact there is some necrosis - improved to stable. ? ? ?Ext: SCDs in place ? ?Lab Results: ?CBC  ?Recent Labs  ?  10/24/21 ?0129 10/25/21 ?0324  ?WBC 30.3* 22.3*  ?HGB 9.1* 9.3*  ?HCT 26.7* 25.4*  ?PLT 138* 74*  ? ?BMET ?Recent Labs  ?  10/24/21 ?0129 10/25/21 ?0324  ?NA 128* 129*  ?K 4.3 3.3*  ?CL 101 93*  ?CO2 19* 24  ?GLUCOSE 136* 136*  ?BUN 43* 44*  ?CREATININE 1.91* 1.90*  ?CALCIUM 6.4* 6.5*  ? ?PT/INR ?Recent Labs  ?  10/22/21 ?2119  ?LABPROT 15.7*  ?INR 1.3*  ? ?ABG ?Recent Labs  ?  10/24/21 ?0404 10/25/21 ?9628  ?PHART 7.34* 7.57*  ?HCO3 17.9* 24.7  ? ? ?Studies/Results: ? ?Anti-infectives: ?Anti-infectives (From admission, onward)  ? ? Start     Dose/Rate Route Frequency Ordered Stop  ? 10/24/21 1600  piperacillin-tazobactam (ZOSYN) IVPB 3.375 g       ? 3.375 g ?12.5 mL/hr over 240 Minutes Intravenous Every 8 hours 10/24/21 1422 11/03/21  1559  ? 10/24/21 1000  cefTRIAXone (ROCEPHIN) 2 g in sodium chloride 0.9 % 100 mL IVPB  Status:  Discontinued       ? 2 g ?200 mL/hr over 30 Minutes Intravenous Every 24 hours 10/24/21 0747 10/24/21 1357  ? 10/23/21 1530  vancomycin (VANCOCIN) IVPB 1000 mg/200 mL premix       ? 1,000 mg ?200 mL/hr over 60 Minutes Intravenous  Once 10/23/21 1439 10/23/21 1547  ? 10/23/21 0200  ceFEPIme (MAXIPIME) 2 g in sodium chloride 0.9 % 100 mL IVPB  Status:  Discontinued       ? 2 g ?200 mL/hr over 30 Minutes Intravenous Every 12 hours 10/22/21 2032 10/24/21 0747  ? 10/22/21 2200  cefOXitin (MEFOXIN) 2 g in sodium chloride 0.9 % 100 mL IVPB  Status:  Discontinued       ? 2 g ?200 mL/hr over 30 Minutes Intravenous Every 8 hours 10/22/21 1706 10/22/21 2032  ? 10/22/21 1956  vancomycin variable dose per unstable renal function (pharmacist dosing)  Status:  Discontinued       ?  Does not apply See admin instructions 10/22/21 1957 10/23/21 1917  ? 10/22/21 1245  vancomycin (VANCOCIN) IVPB 1000 mg/200 mL premix       ? 1,000 mg ?200 mL/hr over 60 Minutes Intravenous  Once 10/22/21 1241 10/22/21 1402  ? 10/22/21 1245  ceFEPIme (MAXIPIME) 2 g  in sodium chloride 0.9 % 100 mL IVPB       ? 2 g ?200 mL/hr over 30 Minutes Intravenous  Once 10/22/21 1241 10/22/21 1321  ? 10/22/21 1230  metroNIDAZOLE (FLAGYL) IVPB 500 mg  Status:  Discontinued       ? 500 mg ?100 mL/hr over 60 Minutes Intravenous Every 12 hours 10/22/21 1222 10/24/21 1357  ? ?  ? ? ? ?Assessment/Plan: ?Patient Active Problem List  ? Diagnosis Date Noted  ? Anxiety 10/24/2021  ? Chronic pain 10/24/2021  ? Allergic rhinitis 10/24/2021  ? 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 Tobacco user 10/24/2021  ? Underweight 10/24/2021  ? Acquired hammer toe of right foot 10/24/2021  ? Essential hypertension 10/24/2021  ? Urticaria 10/24/2021  ? Dry mouth 10/24/2021  ? Malnutrition of moderate degree 10/24/2021  ? Perforated sigmoid colon s/p Hartmann/colostomy 10/22/2021 10/22/2021  ? Substance  induced mood disorder ([place])   ? Delirium tremens ([place]) 12/04/2017  ? ?s/p Procedure(s): ?EXPLORATORY LAPAROTOMY hartmans 10/22/2021 ?- remains on pressors but requirement decreasing, kidney function stable, appreciate CCM mgmt of septic shock/EtOH withdrawal ?- afebrile, WBC 22 from 30 ?- NPO, MIVF ?- initially had some stool in ostomy pouch and low G tube output, now has mostly serous fluid in ostomy bag and high output from g-tube, consistent with ileus. Continue G tube to LIWS. Would not start tube feeds yet.  ?- Therapies when able ?- WOCN team for new colostomy, ?- continue dakins 1/4 strength BID. Will check wound tomorrow and may transition to wet to dry dressing changes.  ?- Continue IV abx for feculent peritonitis; high risk for post-op abscess, will likely need CT abdomen pelvis in the next 1-3 days to evaluate for this. ?- Keep in ICU today; continue foley given low UOP w/ AKI appreciate CCMs assistance in her care  ? ?- PPx: SCDs; chemical VTE held due to thrombocytopenia  ? ? LOS: 3 days  ? ? ?Obie Dredge, PA-C  ?[facility] ? ?

## 2021-10-26 LAB — CBC
HCT: 24.4 % — ABNORMAL LOW (ref 36.0–46.0)
Hemoglobin: 8.7 g/dL — ABNORMAL LOW (ref 12.0–15.0)
MCH: 31.3 pg (ref 26.0–34.0)
MCHC: 35.7 g/dL (ref 30.0–36.0)
MCV: 87.8 fL (ref 80.0–100.0)
Platelets: 49 10*3/uL — ABNORMAL LOW (ref 150–400)
RBC: 2.78 MIL/uL — ABNORMAL LOW (ref 3.87–5.11)
RDW: 12.7 % (ref 11.5–15.5)
WBC: 14.4 10*3/uL — ABNORMAL HIGH (ref 4.0–10.5)
nRBC: 0 % (ref 0.0–0.2)

## 2021-10-26 LAB — GLUCOSE, CAPILLARY
Glucose-Capillary: 104 mg/dL — ABNORMAL HIGH (ref 70–99)
Glucose-Capillary: 109 mg/dL — ABNORMAL HIGH (ref 70–99)
Glucose-Capillary: 111 mg/dL — ABNORMAL HIGH (ref 70–99)
Glucose-Capillary: 112 mg/dL — ABNORMAL HIGH (ref 70–99)
Glucose-Capillary: 149 mg/dL — ABNORMAL HIGH (ref 70–99)
Glucose-Capillary: 84 mg/dL (ref 70–99)
Glucose-Capillary: 97 mg/dL (ref 70–99)

## 2021-10-26 LAB — PHOSPHORUS: Phosphorus: 3.6 mg/dL (ref 2.5–4.6)

## 2021-10-26 LAB — COMPREHENSIVE METABOLIC PANEL
ALT: 42 U/L (ref 0–44)
AST: 53 U/L — ABNORMAL HIGH (ref 15–41)
Albumin: 1.6 g/dL — ABNORMAL LOW (ref 3.5–5.0)
Alkaline Phosphatase: 36 U/L — ABNORMAL LOW (ref 38–126)
Anion gap: 9 (ref 5–15)
BUN: 45 mg/dL — ABNORMAL HIGH (ref 6–20)
CO2: 25 mmol/L (ref 22–32)
Calcium: 6.9 mg/dL — ABNORMAL LOW (ref 8.9–10.3)
Chloride: 95 mmol/L — ABNORMAL LOW (ref 98–111)
Creatinine, Ser: 2.12 mg/dL — ABNORMAL HIGH (ref 0.44–1.00)
GFR, Estimated: 26 mL/min — ABNORMAL LOW (ref >60)
Glucose, Bld: 119 mg/dL — ABNORMAL HIGH (ref 70–99)
Potassium: 3.3 mmol/L — ABNORMAL LOW (ref 3.5–5.1)
Sodium: 129 mmol/L — ABNORMAL LOW (ref 135–145)
Total Bilirubin: 0.8 mg/dL (ref 0.3–1.2)
Total Protein: 4 g/dL — ABNORMAL LOW (ref 6.5–8.1)

## 2021-10-26 LAB — CULTURE, BLOOD (ROUTINE X 2): Special Requests: ADEQUATE

## 2021-10-26 LAB — MAGNESIUM
Magnesium: 1.2 mg/dL — ABNORMAL LOW (ref 1.7–2.4)
Magnesium: 2.3 mg/dL (ref 1.7–2.4)

## 2021-10-26 LAB — POTASSIUM: Potassium: 3.5 mmol/L (ref 3.5–5.1)

## 2021-10-26 MED ORDER — CALCIUM GLUCONATE-NACL 1-0.675 GM/50ML-% IV SOLN
1.0000 g | Freq: Once | INTRAVENOUS | Status: AC
  Administered 2021-10-26: 1000 mg via INTRAVENOUS
  Filled 2021-10-26: qty 50

## 2021-10-26 MED ORDER — DEXMEDETOMIDINE HCL IN NACL 400 MCG/100ML IV SOLN
0.4000 ug/kg/h | INTRAVENOUS | Status: DC
  Administered 2021-10-26 – 2021-10-27 (×3): 1.2 ug/kg/h via INTRAVENOUS
  Administered 2021-10-27 – 2021-10-28 (×2): 0.7 ug/kg/h via INTRAVENOUS
  Administered 2021-10-28: 0.6 ug/kg/h via INTRAVENOUS
  Administered 2021-10-29: 0.8 ug/kg/h via INTRAVENOUS
  Administered 2021-10-29 – 2021-10-31 (×5): 0.7 ug/kg/h via INTRAVENOUS
  Filled 2021-10-26 (×11): qty 100

## 2021-10-26 MED ORDER — MIDAZOLAM HCL 2 MG/2ML IJ SOLN
1.0000 mg | INTRAMUSCULAR | Status: DC | PRN
  Administered 2021-10-27: 1 mg via INTRAVENOUS
  Filled 2021-10-26: qty 2

## 2021-10-26 MED ORDER — LIP MEDEX EX OINT
TOPICAL_OINTMENT | CUTANEOUS | Status: DC | PRN
  Administered 2021-11-26: 75 via TOPICAL
  Filled 2021-10-26 (×3): qty 7

## 2021-10-26 MED ORDER — DEXMEDETOMIDINE HCL IN NACL 400 MCG/100ML IV SOLN
INTRAVENOUS | Status: AC
  Administered 2021-10-27: 1.2 ug/kg/h via INTRAVENOUS
  Filled 2021-10-26: qty 100

## 2021-10-26 MED ORDER — TRAVASOL 10 % IV SOLN
INTRAVENOUS | Status: AC
  Filled 2021-10-26: qty 576

## 2021-10-26 MED ORDER — MAGNESIUM SULFATE 2 GM/50ML IV SOLN
2.0000 g | Freq: Once | INTRAVENOUS | Status: AC
  Administered 2021-10-26: 2 g via INTRAVENOUS
  Filled 2021-10-26: qty 50

## 2021-10-26 MED ORDER — FUROSEMIDE 10 MG/ML IJ SOLN
40.0000 mg | INTRAMUSCULAR | Status: AC
  Administered 2021-10-26 (×2): 40 mg via INTRAVENOUS
  Filled 2021-10-26 (×2): qty 4

## 2021-10-26 MED ORDER — MAGNESIUM SULFATE 4 GM/100ML IV SOLN
4.0000 g | Freq: Once | INTRAVENOUS | Status: AC
  Administered 2021-10-26: 4 g via INTRAVENOUS
  Filled 2021-10-26: qty 100

## 2021-10-26 MED ORDER — POTASSIUM CHLORIDE 10 MEQ/50ML IV SOLN
10.0000 meq | INTRAVENOUS | Status: AC
  Administered 2021-10-26 (×4): 10 meq via INTRAVENOUS
  Filled 2021-10-26 (×4): qty 50

## 2021-10-26 NOTE — Progress Notes (Signed)
eLink Physician-Brief Progress Note ?Patient Name: Jessica [name] ?DOB: 08-02-1960 ?MRN: [id number] ? ? ?Date of Service ? 10/26/2021  ?HPI/Events of Note ? Sub-optimal sedation on the ventilator with Precedex + Fentanyl  gtt.  ?eICU Interventions ? PRN iv Versed added.  ? ? ? ?  ? ?Frederik Pear ?10/26/2021, 11:23 PM ?

## 2021-10-26 NOTE — Progress Notes (Addendum)
eLink Physician-Brief Progress Note ?Patient Name: Jessica [name] ?DOB: [birth year] ?MRN: [id number] ? ? ?Date of Service ? 10/26/2021  ?HPI/Events of Note ? Patient needs a lip balm.  ?eICU Interventions ? Lip balm ordered.  ? ? ? ?  ? ?Frederik Pear ?10/26/2021, 10:40 PM ?

## 2021-10-26 NOTE — Progress Notes (Signed)
?Subjective ?Intubated/sedated. Still on levo this morning as well as precedex. ? ? ?Objective: ?Vital signs in last 24 hours: ?Temp:  [99.7 ?F (37.6 ?C)-100.6 ?F (38.1 ?C)] 100.1 ?F (37.8 ?C) (04/19 0300) ?Pulse Rate:  [94-132] 104 (04/19 0700) ?Resp:  [17-28] 19 (04/19 0700) ?BP: (64-165)/(38-80) 101/58 (04/19 0700) ?SpO2:  [90 %-100 %] 100 % (04/19 0740) ?FiO2 (%):  [30 %] 30 % (04/19 0747) ?Weight:  [53.8 kg] 53.8 kg (04/19 0500) ?Last BM Date : 10/25/21 ? ?Intake/Output from previous day: ?04/18 0701 - 04/19 0700 ?In: 2103.1 [I.V.:1455.1; IV Piggyback:648] ?Out: 2135 [Urine:1325; Drains:720; Stool:90] ?Intake/Output this shift: ?Total I/O ?In: -  ?Out: 225 [Urine:225] ? ?Gen: ill appearing white female on the ventilator ?CV: sinus tach ?Pulm: on vent ?Abd: Soft ?LLQ ostomy w/ viable edamatous stoma, sweat in pouch ?G tube to suction, flushes well ?JP SS ?Midline wound w/ fascia in tact there is some necrosis of adipose tissue ? ?Lab Results: ?CBC  ?Recent Labs  ?  10/25/21 ?0324 10/26/21 ?0236  ?WBC 22.3* 14.4*  ?HGB 9.3* 8.7*  ?HCT 25.4* 24.4*  ?PLT 74* 49*  ? ?BMET ?Recent Labs  ?  10/25/21 ?0324 10/26/21 ?0236  ?NA 129* 129*  ?K 3.3* 3.3*  ?CL 93* 95*  ?CO2 24 25  ?GLUCOSE 136* 119*  ?BUN 44* 45*  ?CREATININE 1.90* 2.12*  ?CALCIUM 6.5* 6.9*  ? ?PT/INR ?No results for input(s): LABPROT, INR in the last 72 hours. ? ?ABG ?Recent Labs  ?  10/24/21 ?0404 10/25/21 ?8315  ?PHART 7.34* 7.57*  ?HCO3 17.9* 24.7  ? ? ?Studies/Results: ? ?Anti-infectives: ?Anti-infectives (From admission, onward)  ? ? Start     Dose/Rate Route Frequency Ordered Stop  ? 10/24/21 1600  piperacillin-tazobactam (ZOSYN) IVPB 3.375 g       ? 3.375 g ?12.5 mL/hr over 240 Minutes Intravenous Every 8 hours 10/24/21 1422 11/03/21 1559  ? 10/24/21 1000  cefTRIAXone (ROCEPHIN) 2 g in sodium chloride 0.9 % 100 mL IVPB  Status:  Discontinued       ? 2 g ?200 mL/hr over 30 Minutes Intravenous Every 24 hours 10/24/21 0747 10/24/21 1357  ? 10/23/21  1530  vancomycin (VANCOCIN) IVPB 1000 mg/200 mL premix       ? 1,000 mg ?200 mL/hr over 60 Minutes Intravenous  Once 10/23/21 1439 10/23/21 1547  ? 10/23/21 0200  ceFEPIme (MAXIPIME) 2 g in sodium chloride 0.9 % 100 mL IVPB  Status:  Discontinued       ? 2 g ?200 mL/hr over 30 Minutes Intravenous Every 12 hours 10/22/21 2032 10/24/21 0747  ? 10/22/21 2200  cefOXitin (MEFOXIN) 2 g in sodium chloride 0.9 % 100 mL IVPB  Status:  Discontinued       ? 2 g ?200 mL/hr over 30 Minutes Intravenous Every 8 hours 10/22/21 1706 10/22/21 2032  ? 10/22/21 1956  vancomycin variable dose per unstable renal function (pharmacist dosing)  Status:  Discontinued       ?  Does not apply See admin instructions 10/22/21 1957 10/23/21 1917  ? 10/22/21 1245  vancomycin (VANCOCIN) IVPB 1000 mg/200 mL premix       ? 1,000 mg ?200 mL/hr over 60 Minutes Intravenous  Once 10/22/21 1241 10/22/21 1402  ? 10/22/21 1245  ceFEPIme (MAXIPIME) 2 g in sodium chloride 0.9 % 100 mL IVPB       ? 2 g ?200 mL/hr over 30 Minutes Intravenous  Once 10/22/21 1241 10/22/21 1321  ? 10/22/21 1230  metroNIDAZOLE (  FLAGYL) IVPB 500 mg  Status:  Discontinued       ? 500 mg ?100 mL/hr over 60 Minutes Intravenous Every 12 hours 10/22/21 1222 10/24/21 1357  ? ?  ? ? ? ?Assessment/Plan: ?POD 4, s/p EXPLORATORY LAPAROTOMY hartmans 10/22/2021 for perforated sigmoid colon, Dr. Marcello Moores  ?- remains on pressors but requirement decreasing ?- afebrile, WBC down to 14K ?- NPO, MIVF ?- appears to have ileus, continue G tube to LIWS. Would not start tube feeds yet. Will start TNA today ?- Therapies when able ?- WOCN team for new colostomy, ?- continue dakins 1/4 strength BID to midline wound.  ?- Continue IV abx for feculent peritonitis ? ?FEN - g-tube to suction, TNA to start today, fluids per primary, replace K per medicine ?VTE - on hold due to thrombocytopenia ?ID - zosyn ? ?MMP - per primary service ?PCM - start TNA today due to ileus and malnutrition ? ? LOS: 4 days   ? ? ?Obie Dredge, PA-C  ?[facility] ? ?

## 2021-10-26 NOTE — Progress Notes (Signed)
PHARMACY - TOTAL PARENTERAL NUTRITION CONSULT NOTE  ? ?Indication: Prolonged ileus ? ?Patient Measurements: ?Height: 5' 10.5" (179.1 cm) ?Weight: 53.8 kg (118 lb 9.7 oz) ?IBW/kg (Calculated) : 69.65 ?TPN AdjBW (KG): 53.8 ?Body mass index is 16.78 kg/m?. ?Usual Weight: 53.8 kg ? ?Assessment: 61 yo F w/ PMH etoh use disorder, smoker, HTN, GERD ?s/p EXPLORATORY LAPAROTOMY hartmans 10/22/2021 for perforated sigmoid colon, Dr. Marcello Moores  ? ?Glucose / Insulin: no hx DM, on D10 @ 25 ml/hr ?Electrolytes: Na 129 (was 126 on admit), K 3.3 (4 runs per CCM), CoCa 8.82> 1 gm Cagluc per CCM, Mag 1.2, phos 3.6 ?Renal: SCr up to 2.12, ?Hepatic: LFTs were slightly elevated> now close to WNL ?Intake / Output;  UOP 1325 ml/24h4s, drains 720/24 ?I/O -32 ml. Lasix 40 x 2 ordered ?14 Liters positive ?MIVF:  D10 @ 25 ml/hr ?GI Imaging:  ?4/15 CTA: bowel perforation ?4/17 KUB ileus or pSBO ?GI Surgeries / Procedures:  ?10/22/21 s/p EXPLORATORY LAPAROTOMY hartmans for perforated sigmoid colon, Dr. Marcello Moores  ? ?Central access: CVC TL placed 10/23/21 ?TPN start date: 10/26/21 ? ?Nutritional Goals: ?Goal TPN rate is 75 mL/hr (provides 108 g of protein and 1674 kcals per day) ? ?RD Assessment: ?Estimated Needs ?Total Energy Estimated Needs: 1601 kcal ?Total Protein Estimated Needs: 108-134 grams ?Total Fluid Estimated Needs: >/= 2 L/day ? ?Current Nutrition:  ?NPO ? ?Plan:  ?Mag 6 gm now & recheck Mg @ 2000 tonight ?K 4 runs, Ca Glconate 1 gm, lasix 40 IV q4 x2 doses  per CCM ?Start TPN at 40 mL/hr at 1800 ?Electrolytes in TPN: Na 137mq/L, K 5448m/L, Ca 48m46mL, Mg 48mE27m, and Phos 148mm64m. Cl:Ac 1:1 ?Add standard MVI and trace elements to TPN ?Add folic acid & thiamine  ?continue Sensitive q4h SSI and adjust as needed  ?DC MIVF at 1800 ?Monitor TPN labs on Mon/Thurs, Full TPN labs in AM ? ? ?MicheEudelia Bunchrm.D ?10/26/2021 9:18 AM ? ?

## 2021-10-26 NOTE — Progress Notes (Signed)
? ?NAME:  Jessica [name], MRN:  [id number], DOB:  1961/01/20, LOS: 4 ?ADMISSION DATE:  10/22/2021, CONSULTATION DATE:  10/22/21 ?REFERRING MD:  Francia Greaves, CHIEF COMPLAINT:  abdominal pain  ? ?History of Present Illness:  ?[name] with history of alcohol use disorder with prior severe withdrawal/DT requiring ICU admission and precedex, smoking who presented to the ED today for severe diffuse abdominal pain that began the night before admission. She had had constipation for 4d PTA. Endorsed frequent alcohol use. Last drink was day PTA.  ? ?In ED she was found to have pneumoperitoneum and free fluid on CT A/P, severe lactic acidosis. She was started on broad spectrum antibiotics and has been given a total of 5L of crystalloid. Surgery was consulted and she was taken to OR where she underwent ex lap and was found to have perforated sigmoid colon, then had hartman's procedure and insertion of g-tube which was left to gravity drainage, JP bulb in pelvis. She was noted to have feculent peritonitis in the pelvis.  ? ?Pertinent  Medical History  ?Etoh use disorder ?History of DT ?Smoking ? ?Significant Hospital Events: ?Including procedures, antibiotic start and stop dates in addition to other pertinent events   ?10/22/21 ex lap, hartman's procedure, g tube placement. Extubated in OR ?4/17 Reintubated overnight with continued pressor support x2. 11 liters positive thus far with progressive AKI this am  ?4/18 Continued issues with electrolyte abnormalities overnight, remains on levo and neo. She is now 14L positive but urine output has increased  ?4/19 No issues overnight, remains 14L positive but urine output continues to improve. Start to slowly diureses today  ? ?Interim History / Subjective:  ?More alert on vent this am, able to nod appropriately  ? ?Objective   ?Blood pressure (!) 124/55, pulse (!) 102, temperature 100.1 ?F (37.8 ?C), temperature source Axillary, resp. rate 19, height 5' 10.5" (1.791 m), weight 53.8 kg, SpO2 100  %. ?CVP:  [5 mmHg-10 mmHg] 10 mmHg  ?Vent Mode: PRVC ?FiO2 (%):  [30 %] 30 % ?Set Rate:  [18 bmp] 18 bmp ?Vt Set:  [550 mL] 550 mL ?PEEP:  [5 cmH20] 5 cmH20 ?Plateau Pressure:  [15 cmH20-17 cmH20] 16 cmH20  ? ?Intake/Output Summary (Last 24 hours) at 10/26/2021 0702 ?Last data filed at 10/26/2021 0507 ?Gross per 24 hour  ?Intake 2037.04 ml  ?Output 2095 ml  ?Net -57.96 ml  ? ? ?Filed Weights  ? 10/22/21 1739 10/25/21 0500 10/26/21 0500  ?Weight: 53.8 kg 53.5 kg 53.8 kg  ? ? ?Examination: ?General: Acute on chronically ill appearing thin deconditioned middle aged female lying in bed on mechanical ventilation, in NAD ?HEENT: ETT, MM pink/moist, PERRL,  ?Neuro: Opens eyes to verbal stimuli, nod appropriately  ?CV: s1s2 regular rate and rhythm, no murmur, rubs, or gallops,  ?PULM:  Clear to ascultation, tolerating vent well, no increased work of breathing on vent ?GI: soft, bowel sounds active in all 4 quadrants, non-tender, non-distended, LUQ colostomy, JP drain x2, G-Tube to LIWS ?Extremities: warm/dry, no edema  ?Skin: no rashes or lesions ? ?Resolved Hospital Problem list   ?[place] ?Lactic acidosis  ? ?Assessment & Plan:  ? ?Septic shock due to feculent peritonitis with sigmoid perforation  ?-s/p hartman's procedure, g-tube placement 4/15 ?Klebsiella Oxytoca bacteremia  ?-Resistant to Ampicillin only  ?P: ?Remains on Zosyn per surgery can likely descalate  ?Pressor requirement decreasing, now only on levo  ?Continue to monitor urine output  ?Diet advancement/TPN per surgery  ?Local wound care  ?  ?  Acute Hypoxic Respiratory Failure  ?-Secondary to above, re-intubated overnight 4/16 ?P: ?Continue ventilator support with lung protective strategies  ?Wean PEEP and FiO2 for sats greater than 90%. ?Head of bed elevated 30 degrees. ?Plateau pressures less than 30 cm H20.  ?Follow intermittent chest x-ray and ABG.   ?SAT/SBT as tolerated, mentation preclude extubation  ?Ensure adequate pulmonary hygiene  ?Follow cultures  ?VAP  bundle in place  ?PAD protocol ? ?Hyponatremia - improving  ?-Urine electrolytes won't mean too much after this fluid exposure. Suspect this is chronic. ?Hypokalemia  ?P: ?Continue to trend Bmet  ?Supplement as needed  ? ?Alcohol use ?History of DT ?-Has needed precedex before in ICU. ?P: ?Continue Precedex drip  ?CIWA scales  ?Supplement thiamine, folate, MVI ? ?AKI ?-Creatinine 1.24, GFR 50, by am of 4/17 creatinine up trended to 1.91 GFR 30 ?P: ?Trial of diuresing today ?Follow renal function  ?Monitor urine output ?Trend Bmet ?Avoid nephrotoxins ?Ensure adequate renal perfusion  ? ?Transaminitis - improving  ?-Early shock/sepsis vs alcohol related ?P: ?Avoid hepatotoxins  ?Trend LFTs ?Supportive care  ? ?Anemia ?P: ?Trend CBC  ?Monitor for signs of bleeding  ?Hgb > 7 ? ?Hypercalcinemia ?P: ?Trend Bmet  ?Supplement as needed  ? ?Best Practice (right click and "Reselect all SmartList Selections" daily)  ? ?Diet/type: NPO ?DVT prophylaxis: SCD ?GI prophylaxis: PPI ?Lines: N/A ?Foley:  N/A ?Code Status:  full code ?Last date of multidisciplinary goals of care discussion: Continue to update family daily  ? ?Critical care time: 38 minutes  ?Aldeen Riga D. Harris, NP-C ?[facility] ?Personal contact information can be found on Amion  ?10/26/2021, 7:02 AM ? ? ? ? ?  ?

## 2021-10-27 ENCOUNTER — Inpatient Hospital Stay (HOSPITAL_COMMUNITY): Payer: Self-pay

## 2021-10-27 LAB — GLUCOSE, CAPILLARY
Glucose-Capillary: 123 mg/dL — ABNORMAL HIGH (ref 70–99)
Glucose-Capillary: 126 mg/dL — ABNORMAL HIGH (ref 70–99)
Glucose-Capillary: 130 mg/dL — ABNORMAL HIGH (ref 70–99)
Glucose-Capillary: 131 mg/dL — ABNORMAL HIGH (ref 70–99)
Glucose-Capillary: 133 mg/dL — ABNORMAL HIGH (ref 70–99)
Glucose-Capillary: 149 mg/dL — ABNORMAL HIGH (ref 70–99)

## 2021-10-27 LAB — COMPREHENSIVE METABOLIC PANEL
ALT: 33 U/L (ref 0–44)
AST: 36 U/L (ref 15–41)
Albumin: <1.5 g/dL — ABNORMAL LOW (ref 3.5–5.0)
Alkaline Phosphatase: 32 U/L — ABNORMAL LOW (ref 38–126)
Anion gap: 9 (ref 5–15)
BUN: 44 mg/dL — ABNORMAL HIGH (ref 6–20)
CO2: 26 mmol/L (ref 22–32)
Calcium: 7.2 mg/dL — ABNORMAL LOW (ref 8.9–10.3)
Chloride: 96 mmol/L — ABNORMAL LOW (ref 98–111)
Creatinine, Ser: 2.2 mg/dL — ABNORMAL HIGH (ref 0.44–1.00)
GFR, Estimated: 25 mL/min — ABNORMAL LOW (ref >60)
Glucose, Bld: 136 mg/dL — ABNORMAL HIGH (ref 70–99)
Potassium: 3.1 mmol/L — ABNORMAL LOW (ref 3.5–5.1)
Sodium: 131 mmol/L — ABNORMAL LOW (ref 135–145)
Total Bilirubin: 0.8 mg/dL (ref 0.3–1.2)
Total Protein: 3.9 g/dL — ABNORMAL LOW (ref 6.5–8.1)

## 2021-10-27 LAB — CULTURE, BLOOD (ROUTINE X 2): Culture: NO GROWTH

## 2021-10-27 LAB — CBC
HCT: 23.9 % — ABNORMAL LOW (ref 36.0–46.0)
Hemoglobin: 8.4 g/dL — ABNORMAL LOW (ref 12.0–15.0)
MCH: 31 pg (ref 26.0–34.0)
MCHC: 35.1 g/dL (ref 30.0–36.0)
MCV: 88.2 fL (ref 80.0–100.0)
Platelets: 33 10*3/uL — ABNORMAL LOW (ref 150–400)
RBC: 2.71 MIL/uL — ABNORMAL LOW (ref 3.87–5.11)
RDW: 12.7 % (ref 11.5–15.5)
WBC: 9 10*3/uL (ref 4.0–10.5)
nRBC: 0 % (ref 0.0–0.2)

## 2021-10-27 LAB — TRIGLYCERIDES: Triglycerides: 99 mg/dL (ref <150)

## 2021-10-27 LAB — PHOSPHORUS: Phosphorus: 3.5 mg/dL (ref 2.5–4.6)

## 2021-10-27 LAB — MAGNESIUM: Magnesium: 2.1 mg/dL (ref 1.7–2.4)

## 2021-10-27 MED ORDER — PIPERACILLIN-TAZOBACTAM 3.375 G IVPB
3.3750 g | Freq: Three times a day (TID) | INTRAVENOUS | Status: AC
  Administered 2021-10-27 – 2021-11-03 (×22): 3.375 g via INTRAVENOUS
  Filled 2021-10-27 (×22): qty 50

## 2021-10-27 MED ORDER — POTASSIUM CHLORIDE 20 MEQ PO PACK: 20.0000 meq | PACK | ORAL | Status: DC

## 2021-10-27 MED ORDER — POTASSIUM CHLORIDE 10 MEQ/50ML IV SOLN
10.0000 meq | INTRAVENOUS | Status: AC
  Administered 2021-10-27 (×6): 10 meq via INTRAVENOUS
  Filled 2021-10-27 (×6): qty 50

## 2021-10-27 MED ORDER — FUROSEMIDE 10 MG/ML IJ SOLN
40.0000 mg | Freq: Once | INTRAMUSCULAR | Status: AC
  Administered 2021-10-27: 40 mg via INTRAVENOUS
  Filled 2021-10-27: qty 4

## 2021-10-27 MED ORDER — TRAVASOL 10 % IV SOLN
INTRAVENOUS | Status: AC
  Filled 2021-10-27: qty 936

## 2021-10-27 MED ORDER — SODIUM CHLORIDE 0.9 % IV SOLN
2.0000 g | INTRAVENOUS | Status: DC
  Filled 2021-10-27: qty 20

## 2021-10-27 MED ORDER — LORAZEPAM 2 MG/ML IJ SOLN
1.0000 mg | Freq: Four times a day (QID) | INTRAMUSCULAR | Status: DC
Start: 2021-10-27 — End: 2021-10-29
  Administered 2021-10-27 – 2021-10-29 (×8): 1 mg via INTRAVENOUS
  Filled 2021-10-27 (×8): qty 1

## 2021-10-27 MED ORDER — POTASSIUM CHLORIDE 10 MEQ/50ML IV SOLN
10.0000 meq | INTRAVENOUS | Status: AC
  Administered 2021-10-27 (×4): 10 meq via INTRAVENOUS
  Filled 2021-10-27 (×4): qty 50

## 2021-10-27 MED ORDER — INSULIN ASPART 100 UNIT/ML IJ SOLN
0.0000 [IU] | Freq: Four times a day (QID) | INTRAMUSCULAR | Status: DC
  Administered 2021-10-27 – 2021-10-31 (×11): 1 [IU] via SUBCUTANEOUS

## 2021-10-27 MED ORDER — METRONIDAZOLE 500 MG/100ML IV SOLN: 500.0000 mg | Freq: Two times a day (BID) | INTRAVENOUS | Status: DC

## 2021-10-27 MED ORDER — VITAL HIGH PROTEIN PO LIQD
1000.0000 mL | ORAL | Status: DC
  Administered 2021-10-27: 1000 mL

## 2021-10-27 MED ORDER — POTASSIUM CHLORIDE 10 MEQ/50ML IV SOLN: 10.0000 meq | INTRAVENOUS | Status: DC

## 2021-10-27 MED ORDER — POTASSIUM CHLORIDE 10 MEQ/50ML IV SOLN
10.0000 meq | INTRAVENOUS | Status: DC
  Administered 2021-10-27: 10 meq via INTRAVENOUS
  Filled 2021-10-27: qty 50

## 2021-10-27 NOTE — Progress Notes (Signed)
?Subjective ?Intubated/sedated. Still on levo this morning as well as precedex. ? ? ?Objective: ?Vital signs in last 24 hours: ?Temp:  [97.1 ?F (36.2 ?C)-99.5 ?F (37.5 ?C)] 99.5 ?F (37.5 ?C) (04/20 9390) ?Pulse Rate:  [84-120] 85 (04/20 0732) ?Resp:  [14-23] 21 (04/20 0732) ?BP: (67-159)/(44-119) 88/47 (04/20 0732) ?SpO2:  [95 %-100 %] 96 % (04/20 0732) ?FiO2 (%):  [30 %] 30 % (04/20 0732) ?Weight:  [53.8 kg-67.8 kg] 67.8 kg (04/20 0402) ?Last BM Date : 10/25/21 ? ?Intake/Output from previous day: ?04/19 0701 - 04/20 0700 ?In: 2006.1 [I.V.:1555.4; IV Piggyback:390.8] ?Out: 3690 [Urine:3075; Drains:555; Stool:60] ?Intake/Output this shift: ?No intake/output data recorded. ? ?Gen: ill appearing white female on the ventilator ?CV: sinus tach ?Pulm: on vent ?Abd: Soft ?LLQ ostomy w/ viable edamatous stoma, sweat in pouch ?G tube to suction, flushes well ?JP SS ?Midline wound w/ fascia in tact there is some necrosis of adipose tissue ? ?Lab Results: ?CBC  ?Recent Labs  ?  10/26/21 ?0236 10/27/21 ?0401  ?WBC 14.4* 9.0  ?HGB 8.7* 8.4*  ?HCT 24.4* 23.9*  ?PLT 49* 33*  ? ?BMET ?Recent Labs  ?  10/26/21 ?0236 10/26/21 ?1400 10/27/21 ?0401  ?NA 129*  --  131*  ?K 3.3* 3.5 3.1*  ?CL 95*  --  96*  ?CO2 25  --  26  ?GLUCOSE 119*  --  136*  ?BUN 45*  --  44*  ?CREATININE 2.12*  --  2.20*  ?CALCIUM 6.9*  --  7.2*  ? ?PT/INR ?No results for input(s): LABPROT, INR in the last 72 hours. ? ?ABG ?Recent Labs  ?  10/25/21 ?3009  ?PHART 7.57*  ?HCO3 24.7  ? ? ?Studies/Results: ? ?Anti-infectives: ?Anti-infectives (From admission, onward)  ? ? Start     Dose/Rate Route Frequency Ordered Stop  ? 10/24/21 1600  piperacillin-tazobactam (ZOSYN) IVPB 3.375 g       ? 3.375 g ?12.5 mL/hr over 240 Minutes Intravenous Every 8 hours 10/24/21 1422 11/03/21 1559  ? 10/24/21 1000  cefTRIAXone (ROCEPHIN) 2 g in sodium chloride 0.9 % 100 mL IVPB  Status:  Discontinued       ? 2 g ?200 mL/hr over 30 Minutes Intravenous Every 24 hours 10/24/21 0747  10/24/21 1357  ? 10/23/21 1530  vancomycin (VANCOCIN) IVPB 1000 mg/200 mL premix       ? 1,000 mg ?200 mL/hr over 60 Minutes Intravenous  Once 10/23/21 1439 10/23/21 1547  ? 10/23/21 0200  ceFEPIme (MAXIPIME) 2 g in sodium chloride 0.9 % 100 mL IVPB  Status:  Discontinued       ? 2 g ?200 mL/hr over 30 Minutes Intravenous Every 12 hours 10/22/21 2032 10/24/21 0747  ? 10/22/21 2200  cefOXitin (MEFOXIN) 2 g in sodium chloride 0.9 % 100 mL IVPB  Status:  Discontinued       ? 2 g ?200 mL/hr over 30 Minutes Intravenous Every 8 hours 10/22/21 1706 10/22/21 2032  ? 10/22/21 1956  vancomycin variable dose per unstable renal function (pharmacist dosing)  Status:  Discontinued       ?  Does not apply See admin instructions 10/22/21 1957 10/23/21 1917  ? 10/22/21 1245  vancomycin (VANCOCIN) IVPB 1000 mg/200 mL premix       ? 1,000 mg ?200 mL/hr over 60 Minutes Intravenous  Once 10/22/21 1241 10/22/21 1402  ? 10/22/21 1245  ceFEPIme (MAXIPIME) 2 g in sodium chloride 0.9 % 100 mL IVPB       ? 2 g ?200  mL/hr over 30 Minutes Intravenous  Once 10/22/21 1241 10/22/21 1321  ? 10/22/21 1230  metroNIDAZOLE (FLAGYL) IVPB 500 mg  Status:  Discontinued       ? 500 mg ?100 mL/hr over 60 Minutes Intravenous Every 12 hours 10/22/21 1222 10/24/21 1357  ? ?  ? ? ? ?Assessment/Plan: ?POD 5, s/p EXPLORATORY LAPAROTOMY hartmans 10/22/2021 for perforated sigmoid colon, Dr. Marcello Moores  ?- remains on levo - dropping her pressure when RN trying to wean this. Remains on dex for EtOH withdrawal ?- afebrile, WBC normalized ?- NPO, MIVF ?- appears to have ileus, continue G tube to LIWS. Would not start tube feeds yet. TNA ordered yesterday 4/19. ?- Therapies when able ?- WOCN team for new colostomy ?- wet to dry BID to midline wound.  ?- Continue IV abx for feculent peritonitis ? ?FEN - g-tube to suction, TNA, fluids per primary ?VTE - on hold due to thrombocytopenia ?ID - zosyn 4/17-4/20, spoke to pharmacy about abx -- plan to transition back to  rocephin/flagyl for bacteremia based on BCx sensitivities from 4/15. Afebrile. WBC WNL. Zoysn can worsen thrombocytopenia. If she is developing an abscess or intra-abdominal infection post-op that is not sufficiently covered with Rocephin/Flagyl then it will start declaring itself and we can order CT abd pelvis to further evaluate. ? ?MMP - per primary service ?PCM - TPN  ? ?Ongoing post-op ileus, AKI (UOP improvced but Cr still > 2.0). may need repeat CT abd pelvis in next 24-48 hours to look for post-op abscess. Afebrile, WBC WNL so hold off for now.  ? ? LOS: 5 days  ? ? ?Obie Dredge, PA-C  ?[facility] ? ?

## 2021-10-27 NOTE — Progress Notes (Addendum)
? ?NAME:  Jessica [name], MRN:  [id number], DOB:  10-28-1960, LOS: 5 ?ADMISSION DATE:  10/22/2021, CONSULTATION DATE:  10/22/21 ?REFERRING MD:  Francia Greaves, CHIEF COMPLAINT:  abdominal pain  ? ?History of Present Illness:  ?[name] with history of alcohol use disorder with prior severe withdrawal/DT requiring ICU admission and precedex, smoking who presented to the ED today for severe diffuse abdominal pain that began the night before admission. She had had constipation for 4d PTA. Endorsed frequent alcohol use. Last drink was day PTA.  ? ?In ED she was found to have pneumoperitoneum and free fluid on CT A/P, severe lactic acidosis. She was started on broad spectrum antibiotics and has been given a total of 5L of crystalloid. Surgery was consulted and she was taken to OR where she underwent ex lap and was found to have perforated sigmoid colon, then had hartman's procedure and insertion of g-tube which was left to gravity drainage, JP bulb in pelvis. She was noted to have feculent peritonitis in the pelvis.  ? ?Pertinent  Medical History  ?Etoh use disorder ?History of DT ?Smoking ? ?Significant Hospital Events: ?Including procedures, antibiotic start and stop dates in addition to other pertinent events   ?10/22/21 ex lap, hartman's procedure, g tube placement. Extubated in OR ?4/17 Reintubated overnight with continued pressor support x2. 11 liters positive thus far with progressive AKI this am  ?4/18 Continued issues with electrolyte abnormalities overnight, remains on levo and neo. She is now 14L positive but urine output has increased  ?4/19 No issues overnight, remains 14L positive but urine output continues to improve. Start to slowly diureses today  ? ?Interim History / Subjective:  ?Intermittent agitation overnight requiring versed pushes ?Weaned 2-3 hrs yesterday and back to full support 2/ 2bradypnea ?Weaning 8/5 this am ?Afebrile ?Diuresed 2.8L yesterday, remains net +12L  ?Remains on NE 17 mcg/min ? ?Objective    ?Blood pressure (!) 88/47, pulse 85, temperature 99.5 ?F (37.5 ?C), temperature source Axillary, resp. rate (!) 21, height 5' 10.5" (1.791 m), weight 67.8 kg, SpO2 96 %. ?CVP:  [2 mmHg-11 mmHg] 4 mmHg  ?Vent Mode: CPAP;PSV ?FiO2 (%):  [30 %] 30 % ?Set Rate:  [18 bmp] 18 bmp ?Vt Set:  [550 mL] 550 mL ?PEEP:  [5 cmH20] 5 cmH20 ?Pressure Support:  [8 cmH20] 8 cmH20 ?Plateau Pressure:  [14 cmH20-19 cmH20] 15 cmH20  ? ?Intake/Output Summary (Last 24 hours) at 10/27/2021 0811 ?Last data filed at 10/27/2021 0700 ?Gross per 24 hour  ?Intake 2006.14 ml  ?Output 3465 ml  ?Net -1458.86 ml  ? ?Filed Weights  ? 10/26/21 0500 10/26/21 0909 10/27/21 0402  ?Weight: 53.8 kg 53.8 kg 67.8 kg  ? ? ?Examination: ?General:  Ao Chronically ill thin older female sitting upright in bed in NAD ?HEENT: MM pink/dry, ETT, pupils 3/sluggish ?Neuro: arouses and f/c intermittently, did not f/c for me, but slightly agitated waking up ?CV: rr ir, NSR with PACs ?PULM:  non labored, cleat throughout, no wheeze ?GI:  slightly distended, large bulky abd dressing, ostomy- site pink with light brownish liquid output, gtube to straight drain with bilious output, R JP drain, minimial BS appreciated, foley  ?Extremities: warm/dry, generalized edema  ?Skin: no rashes  ? ?Labs> Na 131, K 3.1, sCr 2.12> 2.2, BUN 45> 44, WBC 14> 9, Hgb 8.7> 8.4, Plts 74> 49> 33, Mag 2.1 ?CBGs 97> 149 ? ?Resolved Hospital Problem list   ?[place] ?Lactic acidosis  ? ?Assessment & Plan:  ? ?Septic shock due to feculent  peritonitis with sigmoid perforation  ?-s/p hartman's procedure, g-tube placement 4/15 ?Klebsiella Oxytoca bacteremia  ?-Resistant to Ampicillin only  ?P: ?Switching today from zosyn> to ceftriaxone/ flagyl based on sensitives ?Consider repeat CT ap if recurrent fever or increasing WBC ?Continue NE for MAP goal > 65 and trend CVP ?Continue to monitor urine output closely ?TPN per surgery (started 4/19) ?Local wound care > WOC for colostomy, and BID wet to dry  dressings ?  ?Acute Hypoxic Respiratory Failure  ?-Secondary to above, re-intubated overnight 4/16 ?P: ?Continue full MV support with daily SBT trials ?VAP/ PPI  ?PAD protocol> fentanyl/ precedex, adding scheduled ativan below given alcohol hx  ?Intermittent CxR ? ?Hyponatremia - improving  ?-Urine electrolytes won't mean too much after this fluid exposure. Suspect this is chronic. ?Hypokalemia  ?P: ?Trend BMET ?Aggressive replete of K > per pharmacy as increasing K in TPN today ? ?Alcohol use ?History of DT ?-Has needed precedex before in ICU. ?P: ?Continue Precedex drip  ?CIWA scales - adding scheduled ativan '1mg'$  q 6hrs as unable to start any enteral meds ?Supplement IV thiamine, folate, MVI ? ?AKI ?-Creatinine 1.24, GFR 50, by am of 4/17 creatinine up trended to 1.91 GFR 30 ?P: ?Good UOP yest to lasix '40mg'$  x2, remains net +12L.  sCr 2.12 > 2.2 ?Aggressively replete of K and continue diureses as long as not increasing pressor requirements.   Mag ok.   ?Continue foley  ?Trend BMP / urinary output ?Replace electrolytes as indicated ?Avoid nephrotoxic agents, ensure adequate renal perfusion ? ?Transaminitis - improving  ?-Early shock/sepsis vs alcohol related ?P: ?Avoid hepatotoxins  ?LFTs wnl today  ? ?Hypocalcemia  ?P: ?Corrected calcium today 9.2 ?Trend on BMET ?Supplement as needed  ? ?Thrombocytopenia  ?Likely 2/2 to septic shock.  No evidence of bleeding/ clotting. Has been on SCDs only for VTEx.   Monitor closely.   ?Changing to zosyn to ctx/ flagyl today, zosyn could be contributing  ? ?Normocytic anemia ?Hgb trend 11.6> 9.1> 9.3> 8.7> 8.4 ?Continue to trend, transfuse for Hgb < 7 ? ?Best Practice (right click and "Reselect all SmartList Selections" daily)  ? ?Diet/type: NPO TPN ?DVT prophylaxis: SCD ?GI prophylaxis: PPI ?Lines: Central line ?Foley:  Yes, and it is still needed ?Code Status:  full code ?Last date of multidisciplinary goals of care discussion: Continue to update family daily  ?No family at  bedside on am rounds.  ? ?Critical care time: 35 minutes  ? ? ?Kennieth Rad, ACNP ?[facility] ?10/27/2021, 8:11 AM ? ?See Amion for pager ?If no response to pager, please call PCCM consult pager ?After 7:00 pm call Elink   ? ? ? ? ?  ?

## 2021-10-27 NOTE — Progress Notes (Signed)
PHARMACY - TOTAL PARENTERAL NUTRITION CONSULT NOTE  ? ?Indication: Prolonged ileus ? ?Patient Measurements: ?Height: 5' 10.5" (179.1 cm) ?Weight: 67.8 kg (149 lb 7.6 oz) ?IBW/kg (Calculated) : 69.65 ?TPN AdjBW (KG): 53.8 ?Body mass index is 21.14 kg/m?. ?Usual Weight: 53.8 kg ? ?Assessment: 61 yo F w/ PMH etoh use disorder, smoker, HTN, GERD ?s/p EXPLORATORY LAPAROTOMY hartmans 10/22/2021 for perforated sigmoid colon, Dr. Marcello Moores  ? ?Glucose / Insulin: no hx DM.  CBGs <180.  Minimal SSI use - 3 units/24 h ?Electrolytes: Na low/improved to 131, K down to 3.1 (s/p lasix 4/19; 50 mEq IV ordered per CCM), CoCa 9.2 (s/p 1 gm CaGluc per CCM), Mag improved to WNL (s/p Mag 6g), Phos remains WNL ?Renal: SCr up to 2.2, BUN 44 ?Hepatic: LFTs improved to WNL ?Intake / Output;  UOP 2875 ml/24h, drains 530/24h ?Net I/O this admit:  12.75 L positive ?MIVF:  D10 @ 25 ml/hr ?GI Imaging:  ?4/15 CTA: bowel perforation ?4/17 KUB ileus or pSBO ?GI Surgeries / Procedures:  ?10/22/21 s/p EXPLORATORY LAPAROTOMY hartmans for perforated sigmoid colon, Dr. Marcello Moores  ? ?Central access: CVC TL placed 10/23/21 ?TPN start date: 10/26/21 ? ?Nutritional Goals: ?Goal TPN rate is 80 mL/hr (provides 125 g of protein and 1601 kcals per day)  ? ?RD Assessment: ?Estimated Needs ?Total Energy Estimated Needs: 1601 kcal ?Total Protein Estimated Needs: 108-134 grams ?Total Fluid Estimated Needs: >/= 2 L/day ? ?Current Nutrition:  ?NPO and TPN ? ?Plan:  ?Advance TPN to 60 mL/hr at 1800 ?Electrolytes in TPN: Na 147mq/L, K 532m/L, Ca 49m449mL, Mg 49mE40m, and Phos 149mm28m. Cl:Ac 2:1 ?Add standard MVI and trace elements to TPN ?Add folic acid & thiamine  ?Continue Sensitive q6h SSI and adjust as needed ?Monitor TPN labs on Mon/Thurs, Full TPN labs in AM ? ?ChrisGretta ArabmD, BCPS ?Clinical Pharmacist ?[facility] [phone] 7:17 AM ? ?

## 2021-10-27 NOTE — Progress Notes (Signed)
Pt pulled on her ET Tube and ET Tuber and RT had to advance the ET tube 2cm. RT will continue to monitor. ?

## 2021-10-27 NOTE — Progress Notes (Signed)
eLink Physician-Brief Progress Note ?Patient Name: Jessica [name] ?DOB: 1960/10/08 ?MRN: [id number] ? ? ?Date of Service ? 10/27/2021  ?HPI/Events of Note ? K+ 3.1. Patient needs restraints order renewed.  ?eICU Interventions ? K+ replaced with KCL per modified E-link electrolyte replacement protocol. Restraints order renewed.  ? ? ? ?  ? ?Frederik Pear ?10/27/2021, 5:37 AM ?

## 2021-10-27 NOTE — Procedures (Signed)
Extubation Procedure Note ? ?Patient Details:   ?Name: Jessica [name] ?DOB: 01-May-1961 ?MRN: [id number] ?  ?Airway Documentation:  ?Airway 7.5 mm (Active)  ?Secured at (cm) 22 cm 10/27/21 1120  ?Measured From Lips 10/27/21 1120  ?Secured Location Left 10/27/21 1120  ?Secured By [employer] 10/27/21 1120  ?Tube Holder Repositioned Yes 10/27/21 1120  ?Prone position No 10/27/21 1120  ?Head position Left 10/27/21 0400  ?Cuff Pressure (cm H2O) Clear OR 27-39 CmH2O 10/27/21 0732  ?Site Condition Dry 10/27/21 1120  ? ?Vent end date: (not recorded) Vent end time: (not recorded)  ? ?Evaluation ? O2 sats: stable throughout ?Complications: No apparent complications ?Patient did tolerate procedure well. ?Bilateral Breath Sounds: Clear ?  ?Yes ? ?[name] ?10/27/2021, 3:50 PM ? ?

## 2021-10-27 NOTE — Progress Notes (Signed)
Pt extubated per protocol. Past leak test, followed directions. And Pt was able to talk after extubation. RT will continue to  monitor ?

## 2021-10-28 ENCOUNTER — Inpatient Hospital Stay (HOSPITAL_COMMUNITY): Payer: Self-pay

## 2021-10-28 ENCOUNTER — Encounter (HOSPITAL_COMMUNITY): Payer: Self-pay | Admitting: Student

## 2021-10-28 LAB — MAGNESIUM: Magnesium: 2 mg/dL (ref 1.7–2.4)

## 2021-10-28 LAB — CBC
HCT: 22 % — ABNORMAL LOW (ref 36.0–46.0)
Hemoglobin: 7.8 g/dL — ABNORMAL LOW (ref 12.0–15.0)
MCH: 31.8 pg (ref 26.0–34.0)
MCHC: 35.5 g/dL (ref 30.0–36.0)
MCV: 89.8 fL (ref 80.0–100.0)
Platelets: 34 10*3/uL — ABNORMAL LOW (ref 150–400)
RBC: 2.45 MIL/uL — ABNORMAL LOW (ref 3.87–5.11)
RDW: 13.1 % (ref 11.5–15.5)
WBC: 10.8 10*3/uL — ABNORMAL HIGH (ref 4.0–10.5)
nRBC: 0 % (ref 0.0–0.2)

## 2021-10-28 LAB — PHOSPHORUS: Phosphorus: 3.7 mg/dL (ref 2.5–4.6)

## 2021-10-28 LAB — GLUCOSE, CAPILLARY
Glucose-Capillary: 133 mg/dL — ABNORMAL HIGH (ref 70–99)
Glucose-Capillary: 133 mg/dL — ABNORMAL HIGH (ref 70–99)
Glucose-Capillary: 128 mg/dL — ABNORMAL HIGH (ref 70–99)
Glucose-Capillary: 128 mg/dL — ABNORMAL HIGH (ref 70–99)
Glucose-Capillary: 109 mg/dL — ABNORMAL HIGH (ref 70–99)

## 2021-10-28 LAB — BASIC METABOLIC PANEL
Anion gap: 9 (ref 5–15)
BUN: 48 mg/dL — ABNORMAL HIGH (ref 6–20)
CO2: 28 mmol/L (ref 22–32)
Calcium: 7.6 mg/dL — ABNORMAL LOW (ref 8.9–10.3)
Chloride: 101 mmol/L (ref 98–111)
Creatinine, Ser: 2.12 mg/dL — ABNORMAL HIGH (ref 0.44–1.00)
GFR, Estimated: 26 mL/min — ABNORMAL LOW (ref >60)
Glucose, Bld: 143 mg/dL — ABNORMAL HIGH (ref 70–99)
Potassium: 3.6 mmol/L (ref 3.5–5.1)
Sodium: 138 mmol/L (ref 135–145)

## 2021-10-28 MED ORDER — ORAL CARE MOUTH RINSE
15.0000 mL | Freq: Two times a day (BID) | OROMUCOSAL | Status: DC
  Administered 2021-10-28 – 2021-10-30 (×5): 15 mL via OROMUCOSAL

## 2021-10-28 MED ORDER — ONDANSETRON HCL 4 MG/2ML IJ SOLN
4.0000 mg | Freq: Three times a day (TID) | INTRAMUSCULAR | Status: DC | PRN
  Administered 2021-10-28 – 2021-11-11 (×2): 4 mg via INTRAVENOUS
  Filled 2021-10-28 (×2): qty 2

## 2021-10-28 MED ORDER — THIAMINE HCL 100 MG PO TABS
100.0000 mg | ORAL_TABLET | Freq: Every day | ORAL | Status: DC
  Administered 2021-10-28: 100 mg
  Filled 2021-10-28: qty 1

## 2021-10-28 MED ORDER — IOHEXOL 9 MG/ML PO SOLN
500.0000 mL | ORAL | Status: AC
  Administered 2021-10-28: 500 mL via ORAL

## 2021-10-28 MED ORDER — MORPHINE SULFATE (PF) 2 MG/ML IV SOLN: 1.0000 mg | INTRAVENOUS | Status: DC | PRN

## 2021-10-28 MED ORDER — HYDROMORPHONE HCL 1 MG/ML IJ SOLN
0.5000 mg | Freq: Once | INTRAMUSCULAR | Status: AC
  Administered 2021-10-28: 0.5 mg via INTRAVENOUS
  Filled 2021-10-28: qty 1

## 2021-10-28 MED ORDER — TRAVASOL 10 % IV SOLN
INTRAVENOUS | Status: AC
  Filled 2021-10-28: qty 936

## 2021-10-28 MED ORDER — FOLIC ACID 1 MG PO TABS
1.0000 mg | ORAL_TABLET | Freq: Every day | ORAL | Status: DC
  Administered 2021-10-28: 1 mg
  Filled 2021-10-28: qty 1

## 2021-10-28 MED ORDER — IOHEXOL 9 MG/ML PO SOLN
ORAL | Status: AC
  Administered 2021-10-28: 500 mL
  Filled 2021-10-28: qty 1000

## 2021-10-28 MED ORDER — POTASSIUM CHLORIDE 20 MEQ PO PACK
40.0000 meq | PACK | Freq: Once | ORAL | Status: AC
  Administered 2021-10-28: 40 meq
  Filled 2021-10-28: qty 2

## 2021-10-28 NOTE — Progress Notes (Signed)
Nutrition Follow-up ? ?DOCUMENTATION CODES:  ? ?Non-severe (moderate) malnutrition in context of chronic illness, Underweight ? ?INTERVENTION:  ?- continue TPN per Pharmacist. ?- continue trickle rate TF and advance per Surgery team. ? ?- recommendation for goal rate TF regimen: ?Osmolite 1.5 @ 55 ml/hr with 45 ml Prosource TF TID. ?- this regimen will provide 2100 kcal, 116 grams protein, and 1008 ml free water. ? ?- will check serum vitamin A, vitamin B12, vitamin C, and vitamin D due to hx of alcohol abuse.  ? ? ?NUTRITION DIAGNOSIS:  ? ?Moderate Malnutrition related to chronic illness (alcohol abuse) as evidenced by moderate fat depletion, moderate muscle depletion, severe muscle depletion. -ongoing ? ?GOAL:  ? ?Patient will meet greater than or equal to 90% of their needs -to be met with nutrition support regimen ? ?MONITOR:  ? ?TF tolerance, Labs, Weight trends, Skin, I & O's ? ?REASON FOR ASSESSMENT:  ? ?New TPN/TNA ? ?ASSESSMENT:  ? ?61 year old female with medical history of tobacco abuse and alcohol use disorder with prior severe withdrawal/DT requiring ICU admission and precede. She presented to the ED due to severe, diffuse abdominal pain that began the night prior and constipation x4 days. She reported frequent alcohol ingestion and last drink was the day PTA. In ED she was found to have pneumoperitoneum and free fluid on CT abdomen/pelvis and severe lactic acidosis. She was started on broad spectrum antibiotics and has been given a total of 5L of crystalloid. Surgery was consulted and she was taken to OR where she underwent ex lap and was found to have perforated sigmoid colon, then had hartman's procedure and insertion of g-tube which was left to gravity drainage, JP bulb in pelvis. She was noted to have feculent peritonitis in the pelvis. ? ?Significant Events:  ?4/15- admission; ex lap with Hartman's procedure and insertion of feeding G-tube ?4/17- re-intubated; OGT insertion; initial RD  assessment ?4/18- OGT dislodgement ?4/19- TPN initiation ?4/20- initiation of TF at 20 ml/hr; extubation ? ? ?Re-estimated nutrition needs based on extubation. Patient discussed in rounds and one-on-one with Pharmacist. Patient laying in bed with no visitors at the time of RD visit. She was drowsy and reported being thirsty. RNs at bedside during [place] visit.  ? ?Weight trending up since 4/19. Used weight from 4/19 (53.8 kg) to re-estimate needs. Will monitor and continue to adjust as warranted. Mild generalized pitting edema documented in the edema section of flow sheet this AM. ? ?Patient is POD #6. G-tube in place and she is receiving Vital High Protein @ 20 ml/hr which is providing 480 kcal, 42 grams protein, and  401 ml free water. Surgery team note this AM indicates no advancement at this time.  ? ?Patient is receiving custom TPN @ 60 ml/hr with goal rate of 80 ml/hr. At goal rate, this regimen will provide 1601 kcal and 125 grams protein.  ? ? ?Labs reviewed; CBGs: 133 x2 mg/dl, BUN: 48 mg/dl, creatinine: 2.12 mg/dl, Ca: 7.6 mg/dl, GFR: 26 ml/min. ?Medications reviewed; 1 mg folvite/day, sliding scale novolog, 40 mEq Klor-Con x1 dose 4/21, 10 mEq IV KCl x10 runs 4/20, 100 mg thiamine/day.  ? ? ?Diet Order:   ?Diet Order   ? ?       ?  Diet NPO time specified  Diet effective now       ?  ? ?  ?  ? ?  ? ? ?EDUCATION NEEDS:  ? ?Not appropriate for education at this time ? ?Skin:  Skin Assessment: Skin Integrity Issues: ?Skin Integrity Issues:: DTI, Incisions ?DTI: vertebral column (newly documented on 4/20) ?Incisions: abdomen (4/15) ? ?Last BM:  4/21 (100 ml via colostomy) ? ?Height:  ? ?Ht Readings from Last 1 Encounters:  ?10/24/21 5' 10.5" (1.791 m)  ? ? ?Weight:  ? ?Wt Readings from Last 1 Encounters:  ?10/28/21 70.9 kg  ? ? ?BMI:  Body mass index is 22.11 kg/m?. ? ?Estimated Nutritional Needs:  ?Kcal:  1900-2150 kcal ?Protein:  100-120 grams ?Fluid:  >/= 2 L/day ? ? ? ? ?Jarome Matin, MS, RD,  LDN ?Registered Dietitian II ?Inpatient Clinical Nutrition ?RD pager # and on-call/weekend pager # available in [place]  ? ?

## 2021-10-28 NOTE — Progress Notes (Signed)
PHARMACY - TOTAL PARENTERAL NUTRITION CONSULT NOTE  ? ?Indication: Prolonged ileus ? ?Patient Measurements: ?Height: 5' 10.5" (179.1 cm) ?Weight: 70.9 kg (156 lb 4.9 oz) ?IBW/kg (Calculated) : 69.65 ?TPN AdjBW (KG): 53.8 ?Body mass index is 22.11 kg/m?. ?Usual Weight: 53.8 kg ? ?Assessment: 61 yo F w/ PMH etoh use disorder, smoker, HTN, GERD ?s/p EXPLORATORY LAPAROTOMY hartmans 10/22/2021 for perforated sigmoid colon, Dr. Marcello Moores  ? ?Glucose / Insulin: no hx DM.  CBGs <180.  Minimal SSI use - 5 units/24 h ?Electrolytes: Na up to 138;  K 3.6  (s/p lasix '40mg'$  4/20 &  110 Meq K- goal >=4), CoCa 9.6, Mag & Phos WNL ?Renal: SCr 2.12, BUN 48 ?Hepatic: LFTs improved to WNL ?Intake / Output;  UOP 4330 ml/24h (s/p lasix 40 yesterday), drains 66m/24h, stool 100/24 hr ?Net I/O -2095 ml/24hr  ?MIVF:  none ?GI Imaging:  ?4/15 CTA: bowel perforation ?4/17 KUB ileus or pSBO ?GI Surgeries / Procedures:  ?10/22/21 s/p EXPLORATORY LAPAROTOMY hartmans for perforated sigmoid colon, Dr. TMarcello Moores ? ?Central access: CVC TL placed 10/23/21 ?TPN start date: 10/26/21 ? ?Nutritional Goals: ?Goal TPN rate is 80 mL/hr (provides 125 g of protein and 1601 kcals per day)  ? ?RD Assessment: ?Estimated Needs ?Total Energy Estimated Needs: 1601 kcal ?Total Protein Estimated Needs: 108-134 grams ?Total Fluid Estimated Needs: >/= 2 L/day ? ?Current Nutrition:  ?TPN @ 60 ml/hr  ?Vital HP at 20 ml/hr via G tube ? ?Plan:  ?Now: K 40 per tube x 1  ?continue TPN at 60 mL/hr  ?Continue Vital HP @ 20 ml/hr per CCS ?Electrolytes in TPN: Na 1043m/L, K 5064mL, Ca 5mE11m, Mg 5mEq70m and Phos 15mmo27m Cl:Ac 1:1 ?Add standard MVI and trace elements to TPN ?change folic acid & thiamine to per G tube (D/w CCS PA-C) ?Continue Sensitive q6h SSI and adjust as needed ?Monitor TPN labs on Mon/Thurs, BMET in AM ? ?MichelEudelia Bunchm.D ?10/28/2021 8:24 AM ? ?

## 2021-10-28 NOTE — Evaluation (Signed)
Physical Therapy Evaluation ?Patient Details ?Name: Jessica [name] ?MRN: [id number] ?DOB: 1960-10-15 ?Today's Date: 10/28/2021 ? ?History of Present Illness ? 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  Pt intubated post op and extubated 10/27/21.    Pt with hx of ETOH abuse and recent R shoulder fx (09/20/21) with X ray from 10/27/21 indicating fx still present.  ?Clinical Impression ? Pt admitted as above and presenting with functional mobility limitations 2* generalized weakness, lethargy, balance deficits and cognitive deficits (pt following cues or answering questions <25% of time).  Pt also noted to have R shoulder fx initially 09/20/21 but with similar appearance on Chest Xray 10/27/21.  Pt currently requiring significant assist of 2 for very basic mobility tasks and would benefit from follow up rehab at SNF level dependent on acute stay progress. ?   ? ?Recommendations for follow up therapy are one component of a multi-disciplinary discharge planning process, led by the attending physician.  Recommendations may be updated based on patient status, additional functional criteria and insurance authorization. ? ?Follow Up Recommendations Skilled nursing-short term rehab (<3 hours/day) ? ?  ?Assistance Recommended at Discharge Frequent or constant Supervision/Assistance  ?Patient can return home with the following ? Two people to help with walking and/or transfers;A lot of help with bathing/dressing/bathroom;Assistance with cooking/housework;Assist for transportation;Help with stairs or ramp for entrance ? ?  ?Equipment Recommendations Other (comment) (TBD)  ?Recommendations for Other Services ? OT consult  ?  ?Functional Status Assessment Patient has had a recent decline in their functional status and demonstrates the ability to make significant improvements in function in a reasonable and predictable amount of time.  ? ?  ?Precautions / Restrictions  Precautions ?Precautions: Fall ?Restrictions ?Weight Bearing Restrictions: No  ? ?  ? ?Mobility ? Bed Mobility ?Overal bed mobility: Needs Assistance ?Bed Mobility: Supine to Sit, Sit to Supine ?  ?  ?Supine to sit: Max assist, +2 for physical assistance, +2 for safety/equipment ?Sit to supine: Max assist, +2 for physical assistance, +2 for safety/equipment ?  ?General bed mobility comments: Pt following min cues and requiring assist to manage LEs and to control trunk with use of pad to complete rotation to/from EOB sitting ?  ? ?Transfers ?  ?  ?  ?  ?  ?  ?  ?  ?  ?General transfer comment: NT 2* pt's limited ability to follow cues and with pt demanding to lie down 2* pain ?  ? ?Ambulation/Gait ?  ?  ?  ?  ?  ?  ?  ?  ? ?Stairs ?  ?  ?  ?  ?  ? ?Wheelchair Mobility ?  ? ?Modified Rankin (Stroke Patients Only) ?  ? ?  ? ?Balance Overall balance assessment: Needs assistance ?Sitting-balance support: Feet supported, Bilateral upper extremity supported ?Sitting balance-Leahy Scale: Poor ?Sitting balance - Comments: Pt sitting EOB but requiring min assist or greater to maintain balance ?Postural control: Posterior lean ?  ?  ?  ?  ?  ?  ?  ?  ?  ?  ?  ?  ?  ?  ?   ? ? ? ?Pertinent Vitals/Pain Pain Assessment ?Pain Assessment: Faces ?Faces Pain Scale: Hurts little more ?Pain Location: abdomen with move to sitting ?Pain Descriptors / Indicators: Sore ?Pain Intervention(s): Limited activity within patient's tolerance, Monitored during session  ? ? ?Home Living Family/patient expects to be discharged to:: Private residence ?  ?  ?  ?  ?  ?  ?  ?  ?  ?  Additional Comments: Pt unable to provide information. PRior stay in 2019 pt was home with spouse PRN in one level house.  ?  ?Prior Function Prior Level of Function : Independent/Modified Independent ?  ?  ?  ?  ?  ?  ?  ?  ?  ? ? ?Hand Dominance  ? Dominant Hand: Right ? ?  ?Extremity/Trunk Assessment  ? Upper Extremity Assessment ?Upper Extremity Assessment: Difficult to  assess due to impaired cognition;RUE deficits/detail (AAROM WFL on L UE) ?RUE Deficits / Details: Shoulder ROM not assessed 2* presence of fx from 09/20/21 and still present on chest Xray 10/27/21 ?  ? ?Lower Extremity Assessment ?Lower Extremity Assessment: Difficult to assess due to impaired cognition ?  ? ?Cervical / Trunk Assessment ?Cervical / Trunk Assessment: Normal  ?Communication  ? Communication: No difficulties  ?Cognition Arousal/Alertness: Lethargic ?Behavior During Therapy: Flat affect ?Overall Cognitive Status: No family/caregiver present to determine baseline cognitive functioning ?  ?  ?  ?  ?  ?  ?  ?  ?  ?  ?  ?  ?  ?  ?  ?  ?General Comments: Pt following cues and answering questions <25% of time ?  ?  ? ?  ?General Comments   ? ?  ?Exercises    ? ?Assessment/Plan  ?  ?PT Assessment Patient needs continued PT services  ?PT Problem List Decreased strength;Decreased range of motion;Decreased activity tolerance;Decreased balance;Decreased mobility;Decreased knowledge of use of DME;Pain;Decreased safety awareness;Decreased cognition ? ?   ?  ?PT Treatment Interventions DME instruction;Gait training;Stair training;Functional mobility training;Therapeutic activities;Therapeutic exercise;Balance training;Neuromuscular re-education;Cognitive remediation;Patient/family education   ? ?PT Goals (Current goals can be found in the Care Plan section)  ?Acute Rehab PT Goals ?Patient Stated Goal: Lie back down ?PT Goal Formulation: With patient ?Time For Goal Achievement: 11/11/21 ?Potential to Achieve Goals: Fair ? ?  ?Frequency Min 3X/week ?  ? ? ?Co-evaluation   ?  ?  ?  ?  ? ? ?  ?AM-PAC PT "6 Clicks" Mobility  ?Outcome Measure Help needed turning from your back to your side while in a flat bed without using bedrails?: Total ?Help needed moving from lying on your back to sitting on the side of a flat bed without using bedrails?: Total ?Help needed moving to and from a bed to a chair (including a wheelchair)?:  Total ?Help needed standing up from a chair using your arms (e.g., wheelchair or bedside chair)?: Total ?Help needed to walk in hospital room?: Total ?Help needed climbing 3-5 steps with a railing? : Total ?6 Click Score: 6 ? ?  ?End of Session Equipment Utilized During Treatment: Oxygen ?Activity Tolerance: Patient limited by fatigue;Patient limited by pain;Other (comment) (cognition) ?Patient left: in bed;with call bell/phone within reach;with nursing/sitter in room ?Nurse Communication: Mobility status ?PT Visit Diagnosis: Muscle weakness (generalized) (M62.81);History of falling (Z91.81);Difficulty in walking, not elsewhere classified (R26.2);Pain ?Pain - part of body:  (abdomen) ?  ? ?Time: 7793-9030 ?PT Time Calculation (min) (ACUTE ONLY): 28 min ? ? ?Charges:   PT Evaluation ?$PT Eval Moderate Complexity: 1 Mod ?PT Treatments ?$Therapeutic Activity: 8-22 mins ?  ?   ? ? ?Debe Coder PT ?Acute Rehabilitation Services ?Pager [phone] ?Office [phone] ? ? ?Samantha Ragen ?10/28/2021, 1:35 PM ? ?

## 2021-10-28 NOTE — Progress Notes (Addendum)
? ?NAME:  Jessica [name], MRN:  [id number], DOB:  Oct 27, 1960, LOS: 6 ?ADMISSION DATE:  10/22/2021, CONSULTATION DATE:  10/22/21 ?REFERRING MD:  Francia Greaves, CHIEF COMPLAINT:  abdominal pain  ? ?History of Present Illness:  ?[name] with history of alcohol use disorder with prior severe withdrawal/DT requiring ICU admission and precedex, smoking who presented to the ED today for severe diffuse abdominal pain that began the night before admission. She had had constipation for 4d PTA. Endorsed frequent alcohol use. Last drink was day PTA.  ? ?In ED she was found to have pneumoperitoneum and free fluid on CT A/P, severe lactic acidosis. She was started on broad spectrum antibiotics and has been given a total of 5L of crystalloid. Surgery was consulted and she was taken to OR where she underwent ex lap and was found to have perforated sigmoid colon, then had hartman's procedure and insertion of g-tube which was left to gravity drainage, JP bulb in pelvis. She was noted to have feculent peritonitis in the pelvis.  ? ?Pertinent  Medical History  ?Etoh use disorder ?History of DT ?Smoking ? ?Significant Hospital Events: ?Including procedures, antibiotic start and stop dates in addition to other pertinent events   ?10/22/21 ex lap, hartman's procedure, g tube placement. Extubated in OR ?4/17 Reintubated overnight with continued pressor support x2. 11 liters positive thus far with progressive AKI this am  ?4/18 Continued issues with electrolyte abnormalities overnight, remains on levo and neo. She is now 14L positive but urine output has increased  ?4/19 No issues overnight, remains 14L positive but urine output continues to improve. Start to slowly diureses today  ?4/20 weaning NE, extubated, diuresed, started trickle TF, remains on precedex for agitation  ? ?Interim History / Subjective:  ?Still having periods of agitation/ hallucinations, scheduled ativan helps, remains on precedex 0.7 ? ?NE at 57mg/ min, slowly improving  ?Afebrile   ? ? ?Objective   ?Blood pressure 123/62, pulse (!) 106, temperature (!) 97.5 ?F (36.4 ?C), temperature source Axillary, resp. rate (!) 22, height 5' 10.5" (1.791 m), weight 70.9 kg, SpO2 98 %. ?CVP:  [3 mmHg-10 mmHg] 10 mmHg  ?Vent Mode: CPAP;PSV ?FiO2 (%):  [30 %] 30 % ?PEEP:  [5 cmH20] 5 cmH20 ?Pressure Support:  [8 cmH20] 8 cmH20  ? ?Intake/Output Summary (Last 24 hours) at 10/28/2021 0740 ?Last data filed at 10/28/2021 0700 ?Gross per 24 hour  ?Intake 2752.66 ml  ?Output 4240 ml  ?Net -1487.34 ml  ? ?Filed Weights  ? 10/26/21 0909 10/27/21 0402 10/28/21 0500  ?Weight: 53.8 kg 67.8 kg 70.9 kg  ? ? ?Examination: ?General:  Thin older female sitting upright in bed in NAD, resting.  ?HEENT: MM pink/dry, pupils 3/reactive, anicteric  ?Neuro:  Awake, oriented to person, place and year, not month, MAE- weakly ?CV: rr, NSR to ST, no murmur, R subclavian CVL site wnl ?PULM:  non labored, CTA ?GI: soft, few BS, large midline bulky dressing cdi, ostomy site pink, right JP, Gtube to TF ?Extremities: warm/dry, no tibial edema, dependent edema in Ue's, right slightly more/bruised  ?Pending CVP? 3 ?Labs: Na 131 > 138, K 3.1> 3.6, sCr 2.12> 2.2> 2.12, BUN 45> 44> 48, WBC 14> 9> 10.8, Hgb 8.7> 8.4> 7.8, Plts 74> 49> 33> 34, Mag 2.0, phos 3.7 ?CBGs 123 > 149 ?Afebrile ?UOP 4.1L, remains net +10L ?Wts 67.8> 70.9 ?Decreasing outpt from JP 30> 10 ?Gtube to TF at 20 ml/hr ?Ostomy output 90> 60> 100 ? ?Resolved Hospital Problem list   ?NPolson?Lactic  acidosis  ? ?Assessment & Plan:  ? ?Septic shock due to feculent peritonitis with sigmoid perforation  ?-s/p hartman's procedure, g-tube placement 4/15 ?Klebsiella Oxytoca bacteremia  ?-Resistant to Ampicillin only  ?P: ?Continue abx per Surgery, day 5/x zosyn   ?Consider repeat CT ap if recurrent fever or increasing WBC ?Slow but continued weaning of levophed ?Goal MAP > 65 ?trend CVP ?Consider transition of CVL to PICC when AKI improved  ?Hold on further diuresis today, CVP ?1-3 ?TPN per  surgery (started 4/19), trickle TF started 4/20 ?Local wound care > WOC for colostomy and plans to place wound vac to midline incision today  ?Start PT/ OT if ok with surgery  ?  ?Acute Hypoxic Respiratory Failure  ?-Secondary to above, re-intubated overnight 4/16 ?P: ?Extubated 4/20 and doing well since, currently on 2L  ?Wean supplemental O2 for sat goal > 92% ?Aggressive pulmonary hygiene- IS, mobilize as able  ? ?Hyponatremia, resolved  ?Hypokalemia, improving ?P: ?Trend BMET ?Goal K >4 ?Replete as needed> per pharmacy given TPN ? ?Alcohol use ?History of DT ?-Has needed precedex before in ICU. ?P: ?Day 5 of hospitalization, monitor for WD ?Continue Precedex drip for CIWA ?Continue scheduled ativan '1mg'$  q 6hr ?Supplement IV thiamine, folate, MVI ? ?AKI ?-Creatinine 1.24, GFR 50, by am of 4/17 creatinine up trended to 1.91 GFR 30 ?P: ?Great response to lasix yesterday  ?sCr slowly improving ?Strict I/Os ?D/d foley today  ?Trend BMP / urinary output ?Replace electrolytes as indicated ?Avoid nephrotoxic agents, ensure adequate renal perfusion ? ?Transaminitis - improving  ?-Early shock/sepsis vs alcohol related ?P: ?Avoid hepatotoxins  ?LFTs wnl 4/20, trend  ? ?Hypocalcemia, resolved  ?P: ?Trend on BMET ?Supplement as needed  ? ?Thrombocytopenia  ?Likely 2/2 to septic shock.  No evidence of bleeding/ clotting. Has been on SCDs only for VTEx.   Monitor closely.  Zosyn may be contributing.  ?- stable today, continue to monitor closely  ? ?Normocytic anemia ?Hgb trend 11.6> 9.1> 9.3> 8.7> 8.4> 7.9 ?Continue to trend, transfuse for Hgb < 7 ? ?Right proximal humerus fracture, pta  ?- s/p fall in March 2023 ?- will need outpt ortho f/u.  Seen by ortho 09/26/21 with plans for non weightbearing of RUE x1 month and then f/u with ortho in one month.  ? ?Best Practice (right click and "Reselect all SmartList Selections" daily)  ? ?Diet/type: NPO TPN, trickle TF per surgery- do not advance  ?DVT prophylaxis: SCD ?GI prophylaxis:  PPI ?Lines: Central line ?Foley:  Yes, and it is no longer needed ?Code Status:  full code ?Last date of multidisciplinary goals of care discussion: Continue to update family daily  ?No family at bedside.  [name] ([place]) called and updated 4/21. ? ?Critical care time: 35 minutes  ? ? ?Kennieth Rad, ACNP ?[facility] ?10/28/2021, 7:40 AM ? ?See Amion for pager ?If no response to pager, please call PCCM consult pager ?After 7:00 pm call Elink   ? ? ? ? ?  ?

## 2021-10-28 NOTE — Progress Notes (Signed)
I reviewed the CAT scan and discussed with Dr. Jacqualyn Posey with interventional radiology. ? ? ?Patient has a very dilated bowel consistent with severe ileus and may be inflammatory bowel obstruction.  Fair amount of ascites in the left lower quadrant.  I worry that that could be infected which could explain her persistent ileus and leukocytosis.  I asked Dr. Earleen Newport to consider placing a drain in that region to rule out an undrained abscess since she in fact that she had feculent peritonitis with massive contamination on her surgery last week.  He is leaning toward considering it tomorrow morning.  I placed orders. ? ?Would keep the patient n.p.o. and placed the gastric tube to low intermittent wall suction.  Stomach rather dilated.  850 mL immediately returned. ?

## 2021-10-28 NOTE — Consult Note (Addendum)
WOC Nurse Consult Note: ?Reason for Consult: placement of NPWT to the midline wound ?Wound type: surgical  ?Pressure Injury POA: NA ?Measurement: 19cm x 6cm x 3cm  ?Wound bed: 95% yellow; leathery slough/5% pink ?Drainage (amount, consistency, odor) serosanguinous  ?Periwound: intact  ?Dressing procedure/placement/frequency: ?Removed gauze dressings.  ?Uncomplicated midline wound; mostly yellow leathery like subcutaneous tissue/slough ?1pc of black foam used to fill wound bed, sealed at 170mHG.  Patient received IV Dilaudid for dressing change. She is extubated but not completely alert. She is able to verify her name and address for me. ?Patient tolerated well. She has on mitts for safety.  ? ?WBathNurse ostomy follow up ?Stoma type/location: LLQ, end colostomy ?Stomal assessment/size: 1 3/4" round, budded, 20% slough at the left lateral aspect of the stomal surface  ?Peristomal assessment: intact, MARSI noted from stoma towards the midline wound, intact blood filled blister  ?Treatment options for stomal/peristomal skin: 2" skin barrier ring ?Output scant, serous liquid ?Ostomy pouching: 2pc. 2 3/4" with 2" skin barrier ?Education provided: none ?PT is confused today, she has a significant other that comes in but is reported to only be in for 3-4 mins. At a time.  Patient reports, however never opens her eyes during my entire visit, that she lives with "JMerry Proud sometimes.  Unclear who will be support for ostomy; would plan education at the point when patient is awake and alert  ?[place] in [place]: Yes ? ?WTierra BonitaNurse will follow along with you for continued support with ostomy teaching and care ?Shep Porter ALiane ComberMSN, RN, CKeo CNS, CWON-AP ?[phone] ? ? ?  ?

## 2021-10-28 NOTE — Progress Notes (Signed)
?Subjective ?Extubated yesterday. Oriented to person, year, president. States she is in [place]. Recalls her family member telling her to call EMS to come to hospital.  ? ? ?Objective: ?Vital signs in last 24 hours: ?Temp:  [97.5 ?F (36.4 ?C)-100.3 ?F (37.9 ?C)] 100.3 ?F (37.9 ?C) (04/21 0800) ?Pulse Rate:  [88-120] 108 (04/21 0915) ?Resp:  [14-30] 24 (04/21 0915) ?BP: (100-166)/(53-103) 107/55 (04/21 0915) ?SpO2:  [95 %-100 %] 98 % (04/21 0915) ?FiO2 (%):  [30 %] 30 % (04/20 1120) ?Weight:  [70.9 kg] 70.9 kg (04/21 0500) ?Last BM Date : 10/25/21 ? ?Intake/Output from previous day: ?04/20 0701 - 04/21 0700 ?In: 2752.7 [I.V.:2001.3; NG/GT:382.7; IV Piggyback:368.7] ?Out: 1638 [GYKZL:9357; Drains:10; SVXBL:390] ?Intake/Output this shift: ?Total I/O ?In: 622.7 [I.V.:591.5; IV Piggyback:31.3] ?Out: 500 [Urine:500] ? ?Gen: ill appearing white female, somnolent but arouses to voice, mitts in place ?Pulm: nonlabored ?Abd: Soft, increased abd distention compared to yesterday. ?LLQ ostomy w/ viable edamatous stoma, SS drainage in pouch ?G tube w/ TF in place.  ?JP SS ?Midline wound w/ fascia in tact there is some necrosis of adipose tissue ? ?Lab Results: ?CBC  ?Recent Labs  ?  10/27/21 ?0401 10/28/21 ?0302  ?WBC 9.0 10.8*  ?HGB 8.4* 7.8*  ?HCT 23.9* 22.0*  ?PLT 33* 34*  ? ?BMET ?Recent Labs  ?  10/27/21 ?0401 10/28/21 ?0302  ?NA 131* 138  ?K 3.1* 3.6  ?CL 96* 101  ?CO2 26 28  ?GLUCOSE 136* 143*  ?BUN 44* 48*  ?CREATININE 2.20* 2.12*  ?CALCIUM 7.2* 7.6*  ? ?PT/INR ?No results for input(s): LABPROT, INR in the last 72 hours. ? ?ABG ?No results for input(s): PHART, HCO3 in the last 72 hours. ? ?Invalid input(s): PCO2, PO2 ? ? ?Studies/Results: ? ?Anti-infectives: ?Anti-infectives (From admission, onward)  ? ? Start     Dose/Rate Route Frequency Ordered Stop  ? 10/27/21 1600  cefTRIAXone (ROCEPHIN) 2 g in sodium chloride 0.9 % 100 mL IVPB  Status:  Discontinued       ? 2 g ?200 mL/hr over 30 Minutes Intravenous Every 24  hours 10/27/21 0843 10/27/21 1106  ? 10/27/21 1600  piperacillin-tazobactam (ZOSYN) IVPB 3.375 g       ? 3.375 g ?12.5 mL/hr over 240 Minutes Intravenous Every 8 hours 10/27/21 1106 11/03/21 2359  ? 10/27/21 1400  metroNIDAZOLE (FLAGYL) IVPB 500 mg  Status:  Discontinued       ? 500 mg ?100 mL/hr over 60 Minutes Intravenous Every 12 hours 10/27/21 0843 10/27/21 1106  ? 10/24/21 1600  piperacillin-tazobactam (ZOSYN) IVPB 3.375 g  Status:  Discontinued       ? 3.375 g ?12.5 mL/hr over 240 Minutes Intravenous Every 8 hours 10/24/21 1422 10/27/21 0843  ? 10/24/21 1000  cefTRIAXone (ROCEPHIN) 2 g in sodium chloride 0.9 % 100 mL IVPB  Status:  Discontinued       ? 2 g ?200 mL/hr over 30 Minutes Intravenous Every 24 hours 10/24/21 0747 10/24/21 1357  ? 10/23/21 1530  vancomycin (VANCOCIN) IVPB 1000 mg/200 mL premix       ? 1,000 mg ?200 mL/hr over 60 Minutes Intravenous  Once 10/23/21 1439 10/23/21 1547  ? 10/23/21 0200  ceFEPIme (MAXIPIME) 2 g in sodium chloride 0.9 % 100 mL IVPB  Status:  Discontinued       ? 2 g ?200 mL/hr over 30 Minutes Intravenous Every 12 hours 10/22/21 2032 10/24/21 0747  ? 10/22/21 2200  cefOXitin (MEFOXIN) 2 g in sodium chloride 0.9 %  100 mL IVPB  Status:  Discontinued       ? 2 g ?200 mL/hr over 30 Minutes Intravenous Every 8 hours 10/22/21 1706 10/22/21 2032  ? 10/22/21 1956  vancomycin variable dose per unstable renal function (pharmacist dosing)  Status:  Discontinued       ?  Does not apply See admin instructions 10/22/21 1957 10/23/21 1917  ? 10/22/21 1245  vancomycin (VANCOCIN) IVPB 1000 mg/200 mL premix       ? 1,000 mg ?200 mL/hr over 60 Minutes Intravenous  Once 10/22/21 1241 10/22/21 1402  ? 10/22/21 1245  ceFEPIme (MAXIPIME) 2 g in sodium chloride 0.9 % 100 mL IVPB       ? 2 g ?200 mL/hr over 30 Minutes Intravenous  Once 10/22/21 1241 10/22/21 1321  ? 10/22/21 1230  metroNIDAZOLE (FLAGYL) IVPB 500 mg  Status:  Discontinued       ? 500 mg ?100 mL/hr over 60 Minutes Intravenous Every 12  hours 10/22/21 1222 10/24/21 1357  ? ?  ? ? ? ?Assessment/Plan: ?POD 6, s/p EXPLORATORY LAPAROTOMY hartmans 10/22/2021 for perforated sigmoid colon, Dr. Marcello Moores  ?- extubated. Remains on dex for EtOH withdrawal ?- afebrile, WBC normalized ?- NPO, MIVF ?- appears to have ileus, would not advance past trickle TF. [place] for meds per tube.  ?- Therapies when able ?- WOCN team for new colostomy and NPWT to start today ?- Continue IV abx for feculent peritonitis ? ?FEN - g-tube to suction, TNA, fluids per primary ?VTE - on hold due to thrombocytopenia ?ID - zosyn 4/17>> ? ?MMP - per primary service ?PCM - TPN  ? ?Ongoing post-op ileus, AKI (UOP improvced but Cr still > 2.0). Repeat CT abd pelvis today to look for post-op abscess/further evaluation ileus and distention.  ? ? LOS: 6 days  ? ? ?Obie Dredge, PA-C  ?[facility] ? ?

## 2021-10-28 NOTE — Progress Notes (Signed)
Patient had a large, unwitnessed emesis occurrence upon assessment. RN was able to suction via yaunker 175 ml of green emesis as patient coughed it up. Surgical PA notified as well as CCM; new orders for chest x-ray and Zofran received. G tube clamped at this time until CT obtained due to contrast instillation per surgical PA. RN will continue to monitor patient for further emesis occurrences.  ? ? ? ?

## 2021-10-28 NOTE — Consult Note (Signed)
? ?Chief Complaint: ?Patient was seen in consultation today for intraabdominal fluid collection ?Chief Complaint  ?Patient presents with  ? Abdominal Pain  ? Weakness  ? Constipation  ? at the request of Gross, S.  ? ?Referring Physician(s): Gross, S.  ? ?Supervising Physician: Corrie Mckusick ? ?Patient Status: Jessica [name] - In-pt ? ?History of Present Illness: ?Jessica [name] is a 61 y.o. female with no significant PMHs, who presented to [facility] ED on 10/22/21 due to abd pain, found to have severe lactic acidosis and pneumoperitoneum and free fluid on CT A/P.  ? ?General surgery was consulted and she underwent emergent ex lap with hartmans, has been hospitalized in ICU since 4/15. Repeat CT on 4/21 showed: ? ?Findings suspicious for partial small bowel obstruction, with ?transition point in the lower central pelvis. ?  ?Increased small to moderate amount of ascites, without definite ?focal abscess collection. Small amount of postop free air also ?noted. ?  ?New small left pleural effusion and left lower lobe atelectasis. New ?diffuse body wall edema, consistent with anasarca. ?  ?Cholelithiasis. No radiographic evidence of cholecystitis.  ? ?IR was consulted for drain placement for the intraabdominal fluid collection.  ?Case was reviewed and approved for CT guided aspiration and possible drain placement by Dr. Earleen Newport.  ? ?Patient seen in ICU, laying in bed NAD. RN at bedside, reports large volume emesis suctioned via NGT .  ?Patient is somnolent and mumbles words, able to  follow simple command, not oriented.  ?ROS not obtained.  ? ? ?History reviewed. No pertinent past medical history. ? ?Past Surgical History:  ?Procedure Laterality Date  ? LAPAROTOMY N/A 10/22/2021  ? Procedure: EXPLORATORY LAPAROTOMY hartmans;  Surgeon: Leighton Ruff, MD;  Location: [facility] ORS;  Service: General;  Laterality: N/A;  ? ? ?Allergies: ?Patient has no known allergies. ? ?Medications: ?Prior to Admission medications   ?Medication Sig Start Date  End Date Taking? Authorizing Provider  ?fluticasone (FLONASE) 50 MCG/ACT nasal spray Place 1-2 sprays into both nostrils daily as needed for allergies or rhinitis.   Yes [provider]  ?loratadine (CLARITIN) 10 MG tablet Take 10 mg by mouth daily as needed for allergies.   Yes [provider]  ?Multiple Vitamins-Minerals (MULTIVITAMIN ADULTS 50+) TABS Take 1 tablet by mouth every morning.   Yes [provider]  ?ondansetron (ZOFRAN) 8 MG tablet Take 8 mg by mouth daily as needed for nausea/vomiting. 10/10/21  Yes [provider]  ?cetirizine (ZYRTEC) 10 MG tablet Take 10 mg by mouth daily as needed for allergies.    [provider]  ?diclofenac Sodium (VOLTAREN) 1 % GEL Apply 4 g topically 4 (four) times daily. ?Patient not taking: Reported on 10/22/2021 09/21/21   Deno Etienne, DO  ?mirtazapine (REMERON SOL-TAB) 15 MG disintegrating tablet Take 1 tablet (15 mg total) by mouth at bedtime. 12/12/17 01/11/18  Arrien, Jimmy Picket, MD  ?morphine (MSIR) 15 MG tablet Take 0.5 tablets (7.5 mg total) by mouth every 4 (four) hours as needed for severe pain. ?Patient not taking: Reported on 10/22/2021 09/26/21   Vanetta Mulders, MD  ?  ? ?History reviewed. No pertinent family history. ? ?Social History  ? ?Socioeconomic History  ? Marital status: Single  ?  Spouse name: Not on file  ? Number of children: Not on file  ? Years of education: Not on file  ? Highest education level: Not on file  ?Occupational History  ? Not on file  ?Tobacco Use  ? Smoking status: Every Day  ?  Packs/day: 1.00  ?  Types: Cigarettes  ? Smokeless tobacco: Never  ?Substance and Sexual Activity  ? Alcohol use: Yes  ?  Comment: answer varies at present "1 bottle a night" or " 3-4 glasses of wine"  ? Drug use: Never  ? Sexual activity: Not on file  ?Other Topics Concern  ? Not on file  ?Social History Narrative  ? Not on file  ? ?Social Determinants of Health  ? ?Financial Resource Strain: Not on file  ?Food  Insecurity: Not on file  ?Transportation Needs: Not on file  ?Physical Activity: Not on file  ?Stress: Not on file  ?Social Connections: Not on file  ? ? ? ?Review of Systems: A 12 point ROS discussed and pertinent positives are indicated in the HPI above.  All other systems are negative. ? ?Vital Signs: ?BP (!) 126/92   Pulse (!) 120   Temp 98.2 ?F (36.8 ?C) (Oral)   Resp (!) 22   Ht 5' 10.5" (1.791 m)   Wt 156 lb 4.9 oz (70.9 kg)   SpO2 97%   BMI 22.11 kg/m?  ? ? ?Physical Exam ?Vitals reviewed.  ?Constitutional:   ?   General: She is not in acute distress. ?   Appearance: She is ill-appearing.  ?HENT:  ?   Head: Atraumatic.  ?Cardiovascular:  ?   Rate and Rhythm: Normal rate and regular rhythm.  ?Pulmonary:  ?   Effort: Pulmonary effort is normal.  ?   Breath sounds: Rhonchi present.  ?   Comments: RUL rhonchi ?Abdominal:  ?   General: Abdomen is flat. Bowel sounds are normal.  ?   Palpations: Abdomen is soft.  ?Skin: ?   General: Skin is warm and dry.  ?   Coloration: Skin is not cyanotic or jaundiced.  ?Neurological:  ?   Mental Status: She is alert.  ?   Comments: Somnolent, oriented to self only    ? ? ?MD Evaluation ?Airway: WNL ?Heart: WNL ?Abdomen: WNL ?Chest/ Lungs: WNL ?ASA  Classification: 3 ?Mallampati/Airway Score: Two ? ?Imaging: ?CT ABDOMEN PELVIS WO CONTRAST ? ?Result Date: 10/28/2021 ?CLINICAL DATA:  Postop from colon resection and colostomy for perforated sigmoid diverticulitis. Severe ileus. Suspected abscess. Recent percutaneous gastrostomy tube placement. EXAM: CT ABDOMEN AND PELVIS WITHOUT CONTRAST TECHNIQUE: Multidetector CT imaging of the abdomen and pelvis was performed following the standard protocol without IV contrast. RADIATION DOSE REDUCTION: This exam was performed according to the departmental dose-optimization program which includes automated exposure control, adjustment of the mA and/or kV according to patient size and/or use of iterative reconstruction technique.  COMPARISON:  10/22/2021 FINDINGS: Lower chest: New small left pleural effusion and left lower lobe atelectasis. Hepatobiliary: No mass visualized on this unenhanced exam. Small less than 1 cm calcified gallstone again seen, however there is no evidence of cholecystitis or biliary ductal dilatation. Pancreas: No mass or inflammatory process visualized on this unenhanced exam. Spleen:  Within normal limits in size. Adrenals/Urinary tract: No evidence of urolithiasis or hydronephrosis. Foley catheter is seen within the bladder which is decompressed. Stomach/Bowel: Small amount of residual postop free air is seen. New percutaneous gastrostomy tube is seen in appropriate position. Moderate to severe dilatation of small bowel is seen, with transition point to nondilated distal small bowel in the central lower pelvis, suspicious for partial small bowel obstruction. Some oral contrast material reaches the right colon which is nondilated. Left lower quadrant colostomy is seen. Moderate wall thickening and diverticulosis is seen involving the proximal  sigmoid colon proximal to the colostomy, which remains consistent with diverticulitis. A surgical drain is seen in the dependent portion of the pelvis. Small to moderate amount of ascites is seen mainly in the pelvis, without definite focal abscess collection. Vascular/Lymphatic: No pathologically enlarged lymph nodes identified. No evidence of abdominal aortic aneurysm. Aortic atherosclerotic calcification noted. Reproductive:  No mass identified. Other:  Diffuse body wall edema is new since prior exam. Musculoskeletal:  No suspicious bone lesions identified. IMPRESSION: Findings suspicious for partial small bowel obstruction, with transition point in the lower central pelvis. Increased small to moderate amount of ascites, without definite focal abscess collection. Small amount of postop free air also noted. New small left pleural effusion and left lower lobe atelectasis. New  diffuse body wall edema, consistent with anasarca. Cholelithiasis. No radiographic evidence of cholecystitis. Aortic Atherosclerosis (ICD10-I70.0). Electronically Signed   By: Marlaine Hind M.D.   On: 10/28/2021 16:48  ? ?CT AB

## 2021-10-29 ENCOUNTER — Inpatient Hospital Stay (HOSPITAL_COMMUNITY): Payer: Self-pay

## 2021-10-29 ENCOUNTER — Other Ambulatory Visit (HOSPITAL_COMMUNITY): Payer: Self-pay

## 2021-10-29 DIAGNOSIS — R579 Shock, unspecified: Secondary | ICD-10-CM

## 2021-10-29 LAB — CBC
HCT: 19.2 % — ABNORMAL LOW (ref 36.0–46.0)
Hemoglobin: 6.6 g/dL — CL (ref 12.0–15.0)
MCH: 31.1 pg (ref 26.0–34.0)
MCHC: 34.4 g/dL (ref 30.0–36.0)
MCV: 90.6 fL (ref 80.0–100.0)
Platelets: 56 10*3/uL — ABNORMAL LOW (ref 150–400)
RBC: 2.12 MIL/uL — ABNORMAL LOW (ref 3.87–5.11)
RDW: 13.3 % (ref 11.5–15.5)
WBC: 9.7 10*3/uL (ref 4.0–10.5)
nRBC: 0 % (ref 0.0–0.2)

## 2021-10-29 LAB — COMPREHENSIVE METABOLIC PANEL
ALT: 18 U/L (ref 0–44)
AST: 18 U/L (ref 15–41)
Albumin: 1.6 g/dL — ABNORMAL LOW (ref 3.5–5.0)
Alkaline Phosphatase: 29 U/L — ABNORMAL LOW (ref 38–126)
Anion gap: 14 (ref 5–15)
BUN: 57 mg/dL — ABNORMAL HIGH (ref 6–20)
CO2: 30 mmol/L (ref 22–32)
Calcium: 7.4 mg/dL — ABNORMAL LOW (ref 8.9–10.3)
Chloride: 100 mmol/L (ref 98–111)
Creatinine, Ser: 1.97 mg/dL — ABNORMAL HIGH (ref 0.44–1.00)
GFR, Estimated: 29 mL/min — ABNORMAL LOW (ref >60)
Glucose, Bld: 118 mg/dL — ABNORMAL HIGH (ref 70–99)
Potassium: 3.5 mmol/L (ref 3.5–5.1)
Sodium: 144 mmol/L (ref 135–145)
Total Bilirubin: 1.3 mg/dL — ABNORMAL HIGH (ref 0.3–1.2)
Total Protein: 4.5 g/dL — ABNORMAL LOW (ref 6.5–8.1)

## 2021-10-29 LAB — PROTIME-INR
INR: 1.2 (ref 0.8–1.2)
Prothrombin Time: 14.9 seconds (ref 11.4–15.2)

## 2021-10-29 LAB — GLUCOSE, CAPILLARY
Glucose-Capillary: 100 mg/dL — ABNORMAL HIGH (ref 70–99)
Glucose-Capillary: 97 mg/dL (ref 70–99)
Glucose-Capillary: 119 mg/dL — ABNORMAL HIGH (ref 70–99)
Glucose-Capillary: 140 mg/dL — ABNORMAL HIGH (ref 70–99)

## 2021-10-29 LAB — FOLATE: Folate: 25.7 ng/mL (ref >5.9)

## 2021-10-29 LAB — PHOSPHORUS: Phosphorus: 4.4 mg/dL (ref 2.5–4.6)

## 2021-10-29 LAB — LACTIC ACID, PLASMA: Lactic Acid, Venous: 0.8 mmol/L (ref 0.5–1.9)

## 2021-10-29 LAB — IRON AND TIBC: Iron: 6 ug/dL — ABNORMAL LOW (ref 28–170)

## 2021-10-29 LAB — RETICULOCYTES
Immature Retic Fract: 5.2 % (ref 2.3–15.9)
RBC.: 2.29 MIL/uL — ABNORMAL LOW (ref 3.87–5.11)
Retic Count, Absolute: 17.6 10*3/uL — ABNORMAL LOW (ref 19.0–186.0)
Retic Ct Pct: 0.8 % (ref 0.4–3.1)

## 2021-10-29 LAB — HEMOGLOBIN AND HEMATOCRIT, BLOOD
HCT: 27.3 % — ABNORMAL LOW (ref 36.0–46.0)
Hemoglobin: 9.3 g/dL — ABNORMAL LOW (ref 12.0–15.0)

## 2021-10-29 LAB — VITAMIN D 25 HYDROXY (VIT D DEFICIENCY, FRACTURES): Vit D, 25-Hydroxy: 51.73 ng/mL (ref 30–100)

## 2021-10-29 LAB — PREPARE RBC (CROSSMATCH)

## 2021-10-29 LAB — MAGNESIUM: Magnesium: 1.8 mg/dL (ref 1.7–2.4)

## 2021-10-29 LAB — VITAMIN B12: Vitamin B-12: 7287 pg/mL — ABNORMAL HIGH (ref 180–914)

## 2021-10-29 MED ORDER — MAGNESIUM SULFATE 2 GM/50ML IV SOLN
2.0000 g | Freq: Once | INTRAVENOUS | Status: AC
  Administered 2021-10-29: 2 g via INTRAVENOUS
  Filled 2021-10-29: qty 50

## 2021-10-29 MED ORDER — LACTATED RINGERS IV BOLUS
500.0000 mL | Freq: Once | INTRAVENOUS | Status: AC
  Administered 2021-10-29: 500 mL via INTRAVENOUS

## 2021-10-29 MED ORDER — PANTOPRAZOLE SODIUM 40 MG IV SOLR
40.0000 mg | Freq: Two times a day (BID) | INTRAVENOUS | Status: AC
  Administered 2021-10-29 – 2021-11-02 (×10): 40 mg via INTRAVENOUS
  Filled 2021-10-29 (×10): qty 10

## 2021-10-29 MED ORDER — FENTANYL CITRATE (PF) 100 MCG/2ML IJ SOLN
INTRAMUSCULAR | Status: AC | PRN
  Administered 2021-10-29: 25 ug via INTRAVENOUS
  Administered 2021-10-29: 50 ug via INTRAVENOUS

## 2021-10-29 MED ORDER — HALOPERIDOL LACTATE 5 MG/ML IJ SOLN
5.0000 mg | Freq: Four times a day (QID) | INTRAMUSCULAR | Status: DC | PRN
  Administered 2021-10-29 – 2021-10-31 (×2): 5 mg via INTRAVENOUS
  Filled 2021-10-29 (×2): qty 1

## 2021-10-29 MED ORDER — POTASSIUM CHLORIDE 10 MEQ/50ML IV SOLN: 10.0000 meq | INTRAVENOUS | Status: DC

## 2021-10-29 MED ORDER — FENTANYL CITRATE (PF) 100 MCG/2ML IJ SOLN
INTRAMUSCULAR | Status: AC
  Filled 2021-10-29: qty 4

## 2021-10-29 MED ORDER — MAGNESIUM SULFATE 2 GM/50ML IV SOLN: 2.0000 g | Freq: Once | INTRAVENOUS | Status: DC

## 2021-10-29 MED ORDER — POTASSIUM CHLORIDE 10 MEQ/50ML IV SOLN
10.0000 meq | INTRAVENOUS | Status: AC
  Administered 2021-10-29 (×4): 10 meq via INTRAVENOUS
  Filled 2021-10-29 (×4): qty 50

## 2021-10-29 MED ORDER — MIDAZOLAM HCL 2 MG/2ML IJ SOLN
1.0000 mg | INTRAMUSCULAR | Status: DC | PRN
  Administered 2021-10-30 – 2021-10-31 (×2): 2 mg via INTRAVENOUS
  Filled 2021-10-29 (×2): qty 2

## 2021-10-29 MED ORDER — TRAVASOL 10 % IV SOLN
INTRAVENOUS | Status: AC
  Filled 2021-10-29: qty 1113.6

## 2021-10-29 MED ORDER — SODIUM CHLORIDE 0.9% IV SOLUTION: Freq: Once | INTRAVENOUS | Status: AC

## 2021-10-29 MED ORDER — FENTANYL CITRATE PF 50 MCG/ML IJ SOSY
25.0000 ug | PREFILLED_SYRINGE | INTRAMUSCULAR | Status: DC | PRN
  Administered 2021-10-30 – 2021-10-31 (×6): 50 ug via INTRAVENOUS
  Filled 2021-10-29 (×2): qty 1
  Filled 2021-10-29: qty 2
  Filled 2021-10-29 (×4): qty 1

## 2021-10-29 MED ORDER — MIDAZOLAM HCL 2 MG/2ML IJ SOLN
INTRAMUSCULAR | Status: AC
  Filled 2021-10-29: qty 4

## 2021-10-29 MED ORDER — MIDAZOLAM HCL 2 MG/2ML IJ SOLN
INTRAMUSCULAR | Status: AC | PRN
  Administered 2021-10-29: .5 mg via INTRAVENOUS
  Administered 2021-10-29: 1 mg via INTRAVENOUS

## 2021-10-29 NOTE — Progress Notes (Signed)
PHARMACY - TOTAL PARENTERAL NUTRITION CONSULT NOTE  ? ?Indication: Prolonged ileus ? ?Patient Measurements: ?Height: 5' 10.5" (179.1 cm) ?Weight: 67.9 kg (149 lb 11.1 oz) ?IBW/kg (Calculated) : 69.65 ?TPN AdjBW (KG): 53.8 ?Body mass index is 21.18 kg/m?. ?Usual Weight: 53.8 kg ? ?Assessment: 61 yo F w/ PMH etoh use disorder, smoker, HTN, GERD ?s/p EXPLORATORY LAPAROTOMY hartmans 10/22/2021 for perforated sigmoid colon, Dr. Marcello Moores  ? ?Glucose / Insulin: no hx DM.  CBGs <180.  Minimal SSI use - 2 units/24 h ?Electrolytes: Na up to 144;  K 3.5  (s/p K 40 per tube yesterday, goal K >=4), CoCa 9.32, Mag 1.8 (goal Mg >=2) Phos WNL ?Renal: SCr 1.97, BUN 57 rising ?Hepatic: LFTs improved to WNL; T bili 1.3 ?Intake / Output;  UOP 1625 ml/24h (s/p lasix 40 4.29), drains 2339m/24h, stool 115/24 hr, NG 175 ml/24 ?Net I/O -17793ml/24hr  ?MIVF:  none ?GI Imaging:  ?4/15 CTA: bowel perforation ?4/17 KUB ileus or pSBO ?2/20 CTA: severe ileus or pSBO ?GI Surgeries / Procedures:  ?10/22/21 s/p EXPLORATORY LAPAROTOMY hartmans for perforated sigmoid colon, Dr. TMarcello Moores ? ?Central access: CVC TL placed 10/23/21 ?TPN start date: 10/26/21 ? ?Nutritional Goals: ?Goal TPN rate is 80 mL/hr (provides ~ 111  g of protein and ~ 2001 kcals per day)  ? ?RD Assessment: ?Estimated Needs ?Total Energy Estimated Needs: 1900-2150 kcal ?Total Protein Estimated Needs: 100-120 grams ?Total Fluid Estimated Needs: >/= 2 L/day ? ?Current Nutrition:  ?TPN @ 60 ml/hr  ?Trickle TFs off 4/21 w/ severe ileus ? ?Plan:  ?Now: 2 gm Mag & 4 runs of K  ?increase TPN to 80 mL/hr to provide 100% of goals ?Electrolytes in TPN: Na 584m/L, K 5025mL, Ca 5mE24m, Mg 5mEq28m and Phos 8mmol68m Cl:Ac 1:1 ?Add standard MVI and trace elements to TPN ?add folic acid & thiamine back to TPN & DC per tube orders ?Continue Sensitive q6h SSI and adjust as needed ?Monitor TPN labs on Mon/Thurs, BMET, Mg & Phos in AM ? ?MichelEudelia Bunchm.D ?10/29/2021 7:21 AM ? ?

## 2021-10-29 NOTE — Progress Notes (Signed)
? ?NAME:  Jessica [name], MRN:  [id number], DOB:  [birth year], LOS: 7 ?ADMISSION DATE:  10/22/2021, CONSULTATION DATE:  10/22/21 ?REFERRING MD:  Francia Greaves, CHIEF COMPLAINT:  abdominal pain  ? ?History of Present Illness:  ?[name] with history of alcohol use disorder with prior severe withdrawal/DT requiring ICU admission and precedex, smoking who presented to the ED today for severe diffuse abdominal pain that began the night before admission. She had had constipation for 4d PTA. Endorsed frequent alcohol use. Last drink was day PTA.  ? ?In ED she was found to have pneumoperitoneum and free fluid on CT A/P, severe lactic acidosis. She was started on broad spectrum antibiotics and has been given a total of 5L of crystalloid. Surgery was consulted and she was taken to OR where she underwent ex lap and was found to have perforated sigmoid colon, then had hartman's procedure and insertion of g-tube which was left to gravity drainage, JP bulb in pelvis. She was noted to have feculent peritonitis in the pelvis.  ? ?Pertinent  Medical History  ?Etoh use disorder ?History of DT ?Smoking ? ? has no past medical history on file. ? ? has a past surgical history that includes laparotomy (N/A, 10/22/2021). ? ? ?Significant Hospital Events: ?Including procedures, antibiotic start and stop dates in addition to other pertinent events   ?10/22/21 ex lap, hartman's procedure, g tube placement. Extubated in OR ?diazepam 5 mg IV q6h scheduled 4/15>>4/17.  ?4/17 Reintubated overnight with continued pressor support x2. 11 liters positive thus far with progressive AKI this am  ?4/18 Continued issues with electrolyte abnormalities overnight, remains on levo and neo. She is now 14L positive but urine output has increased  ?4/19 No issues overnight, remains 14L positive but urine output continues to improve. Start to slowly diureses today  ?4/20 weaning NE, extubated, diuresed, started trickle TF, remains on precedex for agitation  ?Ativan 1 mg IV q6h  scheduled ordered ?10/28/21 - Still having periods of agitation/ hallucinations, scheduled ativan helps, remains on precedex 0.7 NE at 29mg/ min, slowly improving .  ? ?Interim History / Subjective:  ? ?4/22 - CVP 1. On levophed 521m (was off last night and back on). On  TPN.  Did not tolerated TF yesterday -vomitted and now G tube to LIS with bilios returns.  Low grade fever +. WBC now normal. On Zosyn.  On 2L [place]. Hgb < 7g% without overt bleeding - 1 U PRBC ordered., Plat improved from 3069so 5048sAKI better - creat down to 1.97 . On prn morphone not gotten and scheduled benzo due to etoh hx . Precedex continues. Obtunded delirum worse today.  ? ? ?CT ABDOMEN PELVIS WO CONTRAST = esult Date: 10/28/2021 -  IMPRESSION: Findings suspicious for partial small bowel obstruction, with transition point in the lower central pelvis. Increased small to moderate amount of ascites, without definite focal abscess collection. Small amount of postop free air also noted. New small left pleural effusion and left lower lobe atelectasis. New diffuse body wall edema, consistent with anasarca. Cholelithiasis. No radiographic evidence of cholecystitis. Aortic Atherosclerosis (ICD10-I70.0). Electronically Signed   By: JoMarlaine Hind.D.   On: 10/28/2021 16:48  ? ? ?Objective   ?Blood pressure (!) 106/54, pulse 100, temperature 98.9 ?F (37.2 ?C), temperature source Axillary, resp. rate 19, height 5' 10.5" (1.791 m), weight 67.9 kg, SpO2 100 %. ?CVP:  [1 mmHg-4 mmHg] 4 mmHg  ?   ? ?Intake/Output Summary (Last 24 hours) at 10/29/2021 0726 ?Last data filed  at 10/29/2021 0500 ?Gross per 24 hour  ?Intake 2271.22 ml  ?Output 4305 ml  ?Net -2033.78 ml  ? ?Filed Weights  ? 10/27/21 0402 10/28/21 0500 10/29/21 0500  ?Weight: 67.8 kg 70.9 kg 67.9 kg  ? ?General Appearance:  Looks criticall ill OBESE - NO. Very cachectic. Looks malnourished ?Head:  Normocephalic, without obvious abnormality, atraumatic ?Eyes:  PERRL - yes, conjunctiva/corneas - muddy      ?Ears:  Normal external ear canals, both ears ?Nose:  G tube - no but has [place] o2 ?Throat:  ETT TUBE - no , OG tube - no., WEAK COUGH + ?Neck:  Supple,  No enlargement/tenderness/nodules ?Lungs: Clear to auscultation bilaterally, V ?Heart:  S1 and S2 normal, no murmur, CVP - LOW.  Pressors - LEvophed LOW DOSE ?Abdomen:  Soft, no masses, no organomegaly ?Genitalia / Rectal:  Not done ?Extremities:  Extremities- intact ?Skin:  ntact in exposed areas . Sacral area - not examined ?Neurologic:  Sedation - precddex gtt, scheduled ativan  -> RASS - -3/-2 . Moves all 4s - yes. CAM-ICU - POSITIVE . Orientation - MUTTERING and ? ?DRAINS - RLQ JP, Wound vac, G tube  ? ? ?Resolved Hospital Problem list   ?[place] ?Lactic acidosis  ? ?Assessment & Plan:  ? ?Septic shock due to feculent peritonitis with sigmoid perforation   - -s/p hartman's procedure, g-tube placement 4/15 ? ? ?10/29/2021 - transiently off pressors yesterday but back on and correlates with worsening anemia (without overt bleeding) and low CVP. Thought AKI bettter and afebrile ? ? ?P: ?Fluid bolus x 1 ?Check random cortisol ?Goal MAP > 65 ?Transition to PICC when AKI improved  ?Hold diuresis ?  ?Peritonitis, sigmoid perf and P/lap ? ?4/22 - has wound vac, G tube and JP drain ? ?Plan ? - per CCCS ? ? ?Klebsiella Oxytoca bacteremia  - -Resistant to Ampicillin only  ? ?10/29/2021 - low grade fever ++ v resolving. WBC normal. Per RN CCS considering new IP drain ? ?Plan ?- Cefoxitin 4/15 - 4/15 ?- cefepime 4/15 - 4/17 ?- Vanc 4/15 -  4/16 ?- Zosyn 4/17 - 4/20 ?- Ceftriaxone 4/17 - 4/17, 4/20 - 4/20 ? - - flagyl 4/15 - 4/17,  4/20 - 4/20 ?- Zosyn 4/20 - (4/27) ?- CCS drain placement decsiion per CCS ? ? ? ? ?Acute Hypoxic Respiratory Failure  - -Secondary to above, re-intubated overnight 4/16. Extubated 10/27/21 ? ?10/29/2021 - > on 2L [place] and weak cough + ? ? ?P: ?Wean supplemental O2 for sat goal > 92% ?Aggressive pulmonary hygiene- IS, mobilize as able but delirium a  barrier  ?Get echo and bnop rule out CHF ? ? ?Alcohol use and History of DT - Prior to & Present on Admit (has needed prcedex prior admits) ?Acute encephalopathy - post admit ? ?10/29/2021 - fluctuatin betwene obtnded and mildly gitated delirum. On PRecedx gtt ? ?P: ?Continue Precedex drip for CIWA ?DC scheduled ativan '1mg'$  q 6hr and change to versed prn ?Change morphine prn to fent prn in setting of AKI ?Start haldol prn with QTc moniroign ?Supplement IV thiamine, folate, MVI ? ?AKI (baseline creat 0,'57mg'$  in 2019) - Present on Admit 10/22/2021 - creat 1.'5mg'$ % at admit. Peak creat 2,2 on 4.20/23 ? ?- . Improved 4/.23 to 1.'97mg'$ %. On pressors. CVP low ? ?P: ?- fluid bolus and reasesss ? ?Transaminitis  - Present on Admit  - -Early shock/sepsis vs alcohol related ? ?4/22- normalized 10/27/21 ? ?P: ?Avoid hepatotoxins  ?Check RUQ [place] ? ? ? ?  Thrombocytopenia  - Likely 2/2 to septic shock.  No evidence of bleeding/ clotting. Has been on SCDs only for VTEx.    Zosyn may be contributing.  ? ?4/22 - improving ? ?plan ? continue to monitor closely  - when furthe rbetter start heparin/lovenix ? ?Normocytic anemia - Prior to & Present on Admit - baseline 9-10gm% in 2019 and 11gm% on admit ? ?10/29/2021 - slow downtrend c/w anemia of ICU . Now < 7gm% ? ?Plan ?- PRBC 1  unit  ?- - PRBC for hgb </= 6.9gm%   ? - exceptions are ?  -  if ACS susepcted/confirmed then transfuse for hgb </= 8.0gm%,  or  ?  -  active bleeding with hemodynamic instability, then transfuse regardless of hemoglobin value ?  At at all times try to transfuse 1 unit prbc as possible with exception of active hemorrhage ? ? ?Hypomagnesemia 10/29/2021 ? ?Plan ? - replete ? ?Right proximal humerus fracture, pta - - s/p fall in March 2023 ?- will need outpt ortho f/u.  Seen by ortho 09/26/21 with plans for non weightbearing of RUE x1 month and then f/u with ortho in one month.  ? ?MSK ?Cachexia - Prior to & Present on Admit ?Protein Calorie Malnuritioin - (ablg 2,7 at admit)  - moderate Prior to & Present on Admit ? ?Plan ? - monitor ? ?Best Practice (right click and "Reselect all SmartList Selections" daily)  ? ?Diet/type: NPO TPN, trickle TF per surgery- do not advance  ?DVT

## 2021-10-29 NOTE — Progress Notes (Addendum)
? ?Jessica [name] ?[id number] ?1961/05/03 ? ?CARE TEAM: ? ?PCP: Deland Pretty, MD ? ?Outpatient Care Team: Patient Care Team: ?Deland Pretty, MD as PCP - General (Internal Medicine) ? ?Inpatient Treatment Team: Treatment Team: Attending Provider: Brand Males, MD; Technician: Mee Hives, NT; Rounding Team: Pccm, Md, MD; Consulting Physician: Edison Pace, Md, MD; [place] Nurse: McNichol, Rudi Heap, RN; Technician: Benard Halsted, NT; Registered Nurse: Stevenson Clinch, RN; Charge Nurse: Kathy Breach, RN; Rounding Team: Dorthy Cooler Radiology, MD; Pharmacist: Eudelia Bunch, [facility]; Registered Nurse: Elie Goody, RN; Utilization Review: Claudie Leach, RN ? ? ?Problem List:  ? ?Principal Problem: ?  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  Delirium tremens ([place]) ?  Substance induced mood disorder ([place]) ?  Anxiety ?  Chronic pain ?  Primary insomnia ?  Tobacco user ?  Underweight ?  Malnutrition of moderate degree ? ? ?7 Days Post-Op  10/22/2021 ?POST-OPERATIVE DIAGNOSIS:  PERFORATED SIGMOID COLON, MALNUTRITION ?  ?PROCEDURE:   ?EXPLORATORY LAPAROTOMY ?HARTMAN'S PROCEDURE (SIGMOID COLECTOMY/COLOSTOMY) ?INSERTION OF FEEDING G TUBE ?  ?SURGEON: :Leighton Ruff, MD ? ?OR FINDINGS: Purulent ascites throughout the abdomen and stool contamination in the pelvis due to necrotic perforated sigmoid colon ? ?FINAL MICROSCOPIC DIAGNOSIS:  ? ?COLON, SIGMOID, RESECTION:  ?- Diverticulitis with perforation, pericolonic abscess and acute  ?serositis.  ?- Negative for malignancy.  ? ?Assessment ? ?Guarded but slowly stabilizing. ? ?Persistent postoperative ileus in the setting of feculent and purulent peritonitis. ? ?Left lower quadrant fluid collection most likely consistent with infected ascites. ? ?(Hospital Stay = 7 days) ? ?Plan: ?Patient would benefit from drainage of dominant fluid collection left lower quadrant.  Discussed with Dr. Jacqualyn Posey with interventional  radiology.  1 not classic for an abscess or ileus persists and is significant and remains on pressors.  I think this allowed an opportunity to better decompress fluid in the abdomen and rule out atypical organisms.  Dr. Jacqualyn Posey agrees ? ?Continue IV antibiotics for feculent peritonitis.  Would continue 5 days Plast drain placement in the hopes that source control is improved.  Follow-up on cultures. ? ?Right-sided Blake pelvic surgical drain serous with minimal output.  Would remove.  Ordered.  Discussed with ICU nurse ? ?Continue gastrostomy tube to low intermittent wall suction given significant ileus and episode of emesis yesterday. ? ?No convincing evidence of aspiration pneumonitis or hypoxia. ? ?Delirium most likely in the setting of alcohol and substance withdrawal.  On Precedex drip.  Continue ICU care per primary service. ? ?Wound VAC for midline incision although superficial will help it to granulate and close and minimize risk of dehiscence.  Hopefully decompressing the abdomen with the drain will help as well. ? ?Colostomy care.  Ostomy viable but no evidence of flatus. ? ?Continue full nutrition with TPN parenteral nutrition.  If output from gastrostomy tube goes down and patient has definite output from colostomy with flatus and stool, can consider clamping trials and then eventually p.o. or G-tube feeding.  Most likely will take several days.  We will see. ? ? ?FEN - g-tube to suction, TPN, fluids per primary ?VTE - on hold due to thrombocytopenia & need for drain placement ?ID - zosyn 4/17>> ?Renal: Still has elevated creatinine but not oliguric.  Hopefully will improve over time. ?MMP - per primary service ?PCM - TPN  ?  ?Ongoing post-op ileus, AKI (UOP improvced but Cr still > 2.0). Repeat CT abd pelvis today to look for post-op abscess/further evaluation ileus  and distention.  ?  ? ? ? ? ? ? ?I reviewed nursing notes, last 24 h vitals and pain scores, last 48 h intake and output, last 24 h labs  and trends, last 24 h imaging results, and CCM . I have reviewed this patient's available data, including medical history, events of note, test results, etc as part of my evaluation.  A significant portion of that time was spent in counseling.  Care during the described time interval was provided by me. ? ?This care required high  level of medical decision making.  10/29/2021 ? ? ? ?Subjective: ?(Chief complaint) ? ?Patient remains extubated. ? ?ICU nursing at bedside. ? ?Some confusion and moaning.  Initially more alert now more mumbling. ? ?Remains on Precedex. ? ?Foley catheter removed and has urine awake.  Some decent output but challenged to do I's and O's. ? ?CT scan done yesterday.  Results discussed and plan for drain this morning. ? ?Objective: ? ?Vital signs: ? ?Vitals:  ? 10/29/21 0455 10/29/21 0500 10/29/21 0530 10/29/21 0731  ?BP:   (!) 106/54   ?Pulse: (!) 101  100   ?Resp: 20  19   ?Temp:    (!) 97 ?F (36.1 ?C)  ?TempSrc:    Axillary  ?SpO2: 100%  100%   ?Weight:  67.9 kg    ?Height:      ? ? ?Last BM Date : 10/25/21 ? ?Intake/Output  ? ?Yesterday: ? 04/21 0701 - 04/22 0700 ?In: 2271.2 [I.V.:1684.3; NG/GT:332.2; IV Piggyback:134.8] ?Out: 9629 [Urine:1625; Emesis/NG output:175; Drains:2390; BMWUX:324] ?This shift: ? No intake/output data recorded. ? ?Bowel function: ? Flatus: No ? BM:  No ? Drain: Serous ? ? ?Physical Exam: ? ?General: Pt awake/alert in moderate acute distress ?Eyes: PERRL, normal EOM.  Sclera clear.  No icterus ?Neuro: CN II-XII intact w/o focal sensory/motor deficits. ?Lymph: No head/neck/groin lymphadenopathy ?Psych:  ++delerium.  Oriented x 1 ?HENT: Normocephalic, Mucus membranes moist.  No thrush ?Neck: Supple, No tracheal deviation.  No obvious thyromegaly ?Chest: No pain to chest wall compression.  Good respiratory excursion.  No audible wheezing ?CV:  Pulses intact.  Regular rhythm.  No major extremity edema ?MS: Normal AROM mjr joints.  No obvious deformity ? ?Abdomen: Soft.   Moderately distended.  Mild discomfort in lower abdomen but no guarding.  Wound VAC in place.  Colostomy pink with edema.  No flatus or stool in bag.  Right-sided Blake surgical drain in place with serous output..  No evidence of peritonitis.  No incarcerated hernias. ? ?GU: Urine wick in place. ?Ext:   No deformity.  No mjr edema.  No cyanosis.  Foot supports in place. ?Skin: No petechiae / purpurea.  No major sores.  Warm and dry ? ? ? ?Results:  ? ?SURGICAL PATHOLOGY  ?CASE: [id number]  ?PATIENT: Shimeka Dewan  ?Surgical Pathology Report  ? ? ? ? ?Clinical History: Free air Banker)  ? ? ? ? ?FINAL MICROSCOPIC DIAGNOSIS:  ? ?A. COLON, SIGMOID, RESECTION:  ?- Diverticulitis with perforation, pericolonic abscess and acute  ?serositis.  ?- Negative for malignancy.  ? ? ?Tatym Schermer DESCRIPTION:  ? ?Received fresh is a 24 cm segment of colon, clinically sigmoid colon.  ?The serosa is dull, tan-red and partially covered by an exudate.  In the  ?midportion there is a 3 x 2.5 cm transmural defect extending into the  ?mesentery with necrosis in the surrounding soft tissue.  The mucosa is  ?edematous, tan to pale green with hemorrhage present at the  defect.  ?There are diverticula present.  Sections are submitted in 5 cassettes.  ?1, 2 = margins of resection  ?3, 4 = sections at defect  ?5 = section with diverticula (GRP 10/24/2021)  ? ? ?Final Diagnosis performed by Claudette Laws, MD.   Electronically signed  ?10/25/2021  ?Technical and / or Professional components performed at [facility]  ?[facility], [place] [address], [place], [place] [address].  ? Immunohistochemistry Technical component (if applicable) was performed  ?at [facility]. [place], [address],  ?[place], [place] [address].   IMMUNOHISTOCHEMISTRY DISCLAIMER (if applicable):  ?Some of these immunohistochemical stains may have been developed and the  ?performance characteristics determine by [facility]. Some  ?may not have  been cleared or approved by the U.S. Food and Drug  ?Administration. The FDA has determined that such clearance or approval  ?is not necessary. This test is used for clinical purposes. It should not  ?be regarded

## 2021-10-29 NOTE — Procedures (Signed)
Interventional Radiology Procedure Note ? ?Procedure: Image guided drain placement, LLQ.  59F pigtail drain. ? ?Complications: None ? ?EBL: None ?Sample: Culture sent ? ?Recommendations: ?- Routine drain care, with sterile flushes, record output ?- follow up Cx ?- routine wound care ? ?Signed, ? ?Dulcy Fanny. Earleen Newport, DO ? ? ?

## 2021-10-29 NOTE — Progress Notes (Signed)
[facility] ADULT ICU REPLACEMENT PROTOCOL ? ? ?The patient does apply for the [facility] Adult ICU Electrolyte Replacment Protocol based on the criteria listed below:  ? ?1.Exclusion criteria: TCTS patients, ECMO patients, and Dialysis patients ?2. Is GFR >/= 30 ml/min? Yes.    ?Patient's GFR today is 29 ?3. Is SCr </= 2? Yes.   ?Patient's SCr is 1.97 mg/dL ?4. Did SCr increase >/= 0.5 in 24 hours? No. ?5.Pt's weight >40kg  Yes.   ?6. Abnormal electrolyte(s): K+ 3.5  ?7. Electrolytes replaced per protocol ?8.  Call MD STAT for K+ </= 2.5, Phos </= 1, or Mag </= 1 ?Physician:  n/a ? ?Darlys Gales 10/29/2021 6:26 AM ? ?

## 2021-10-29 NOTE — Progress Notes (Addendum)
eLink Physician-Brief Progress Note ?Patient Name: Jessica [name] ?DOB: 06-Jun-1961 ?MRN: [id number] ? ? ?Date of Service ? 10/29/2021  ?HPI/Events of Note ? Hemoglobin 6.6 gm / dl, no evidence of overt bleeding, normal sinus rhythm.  ?eICU Interventions ? One unit of PRBC ordered transfused. Stool occult blood, iv PPI, anemia work up.  ? ? ? ?  ? ?Kerry Kass Jarett Dralle ?10/29/2021, 6:17 AM ?

## 2021-10-30 ENCOUNTER — Inpatient Hospital Stay (HOSPITAL_BASED_OUTPATIENT_CLINIC_OR_DEPARTMENT_OTHER): Payer: Self-pay

## 2021-10-30 DIAGNOSIS — Z931 Gastrostomy status: Secondary | ICD-10-CM

## 2021-10-30 DIAGNOSIS — N179 Acute kidney failure, unspecified: Secondary | ICD-10-CM

## 2021-10-30 DIAGNOSIS — I428 Other cardiomyopathies: Secondary | ICD-10-CM

## 2021-10-30 DIAGNOSIS — K222 Esophageal obstruction: Secondary | ICD-10-CM

## 2021-10-30 DIAGNOSIS — G9340 Encephalopathy, unspecified: Secondary | ICD-10-CM

## 2021-10-30 DIAGNOSIS — Z933 Colostomy status: Secondary | ICD-10-CM

## 2021-10-30 LAB — COMPREHENSIVE METABOLIC PANEL
ALT: 18 U/L (ref 0–44)
AST: 20 U/L (ref 15–41)
Albumin: 1.8 g/dL — ABNORMAL LOW (ref 3.5–5.0)
Alkaline Phosphatase: 34 U/L — ABNORMAL LOW (ref 38–126)
Anion gap: 10 (ref 5–15)
BUN: 66 mg/dL — ABNORMAL HIGH (ref 6–20)
CO2: 28 mmol/L (ref 22–32)
Calcium: 7.9 mg/dL — ABNORMAL LOW (ref 8.9–10.3)
Chloride: 108 mmol/L (ref 98–111)
Creatinine, Ser: 2.02 mg/dL — ABNORMAL HIGH (ref 0.44–1.00)
GFR, Estimated: 28 mL/min — ABNORMAL LOW (ref >60)
Glucose, Bld: 180 mg/dL — ABNORMAL HIGH (ref 70–99)
Potassium: 3.8 mmol/L (ref 3.5–5.1)
Sodium: 146 mmol/L — ABNORMAL HIGH (ref 135–145)
Total Bilirubin: 1.7 mg/dL — ABNORMAL HIGH (ref 0.3–1.2)
Total Protein: 4.9 g/dL — ABNORMAL LOW (ref 6.5–8.1)

## 2021-10-30 LAB — BRAIN NATRIURETIC PEPTIDE: B Natriuretic Peptide: 183.6 pg/mL — ABNORMAL HIGH (ref 0.0–100.0)

## 2021-10-30 LAB — TYPE AND SCREEN
ABO/RH(D): O POS
Antibody Screen: NEGATIVE
Unit division: 0

## 2021-10-30 LAB — GLUCOSE, CAPILLARY
Glucose-Capillary: 147 mg/dL — ABNORMAL HIGH (ref 70–99)
Glucose-Capillary: 129 mg/dL — ABNORMAL HIGH (ref 70–99)
Glucose-Capillary: 117 mg/dL — ABNORMAL HIGH (ref 70–99)

## 2021-10-30 LAB — ECHOCARDIOGRAM COMPLETE
Height: 70.5 in
S' Lateral: 2.7 cm
Weight: 2109.36 oz

## 2021-10-30 LAB — BPAM RBC
Blood Product Expiration Date: 202305222359
ISSUE DATE / TIME: 202304221200
Unit Type and Rh: 5100

## 2021-10-30 LAB — CBC
HCT: 27 % — ABNORMAL LOW (ref 36.0–46.0)
Hemoglobin: 8.8 g/dL — ABNORMAL LOW (ref 12.0–15.0)
MCH: 29.2 pg (ref 26.0–34.0)
MCHC: 32.6 g/dL (ref 30.0–36.0)
MCV: 89.7 fL (ref 80.0–100.0)
Platelets: 92 10*3/uL — ABNORMAL LOW (ref 150–400)
RBC: 3.01 MIL/uL — ABNORMAL LOW (ref 3.87–5.11)
RDW: 15.9 % — ABNORMAL HIGH (ref 11.5–15.5)
WBC: 8.1 10*3/uL (ref 4.0–10.5)
nRBC: 0 % (ref 0.0–0.2)

## 2021-10-30 LAB — MAGNESIUM: Magnesium: 2.2 mg/dL (ref 1.7–2.4)

## 2021-10-30 LAB — LACTIC ACID, PLASMA: Lactic Acid, Venous: 1.5 mmol/L (ref 0.5–1.9)

## 2021-10-30 LAB — HAPTOGLOBIN: Haptoglobin: 276 mg/dL (ref 33–346)

## 2021-10-30 LAB — PROTIME-INR
INR: 1.2 (ref 0.8–1.2)
Prothrombin Time: 14.6 seconds (ref 11.4–15.2)

## 2021-10-30 LAB — CORTISOL-AM, BLOOD: Cortisol - AM: 21.7 ug/dL (ref 6.7–22.6)

## 2021-10-30 LAB — PROCALCITONIN: Procalcitonin: 17.54 ng/mL

## 2021-10-30 LAB — PHOSPHORUS: Phosphorus: 4.2 mg/dL (ref 2.5–4.6)

## 2021-10-30 MED ORDER — HEPARIN SODIUM (PORCINE) 5000 UNIT/ML IJ SOLN
5000.0000 [IU] | Freq: Three times a day (TID) | INTRAMUSCULAR | Status: DC
Start: 2021-10-30 — End: 2021-11-27
  Administered 2021-10-30 – 2021-11-27 (×82): 5000 [IU] via SUBCUTANEOUS
  Filled 2021-10-30 (×81): qty 1

## 2021-10-30 MED ORDER — ORAL CARE MOUTH RINSE: 15.0000 mL | Freq: Two times a day (BID) | OROMUCOSAL | Status: DC

## 2021-10-30 MED ORDER — SODIUM CHLORIDE 0.9% FLUSH
5.0000 mL | Freq: Three times a day (TID) | INTRAVENOUS | Status: DC
  Administered 2021-10-30 – 2021-11-10 (×34): 5 mL

## 2021-10-30 MED ORDER — CHLORHEXIDINE GLUCONATE 0.12 % MT SOLN: 15.0000 mL | Freq: Two times a day (BID) | OROMUCOSAL | Status: DC

## 2021-10-30 MED ORDER — FUROSEMIDE 10 MG/ML IJ SOLN
40.0000 mg | Freq: Once | INTRAMUSCULAR | Status: AC
Start: 2021-10-30 — End: 2021-10-30
  Administered 2021-10-30: 40 mg via INTRAVENOUS
  Filled 2021-10-30: qty 4

## 2021-10-30 MED ORDER — TRAVASOL 10 % IV SOLN
INTRAVENOUS | Status: AC
  Filled 2021-10-30: qty 1113.6

## 2021-10-30 NOTE — Progress Notes (Signed)
PHARMACY - TOTAL PARENTERAL NUTRITION CONSULT NOTE  ? ?Indication: Prolonged ileus ? ?Patient Measurements: ?Height: 5' 10.5" (179.1 cm) ?Weight: 59.8 kg (131 lb 13.4 oz) ?IBW/kg (Calculated) : 69.65 ?TPN AdjBW (KG): 53.8 ?Body mass index is 18.65 kg/m?. ?Usual Weight: 53.8 kg ? ?Assessment: 61 yo F w/ PMH etoh use disorder, smoker, HTN, GERD ?s/p EXPLORATORY LAPAROTOMY hartmans 10/22/2021 for perforated sigmoid colon, Dr. Marcello Moores  ? ?Glucose / Insulin: no hx DM.  CBGs <180.  Minimal SSI use - 2 units/24 h, CBGs 97-147, serum gluc 180 ?Electrolytes: Na up to 146 (was 126 on admit);  K 3.8  (s/p 4 runs yesterday, goal K >=4), CoCa 9.66, Mag 2.2 after 2 gm yesterday (goal Mg >=2) Phos WNL ?Renal: SCr 2.02, BUN 66 rising, UOP down (last lasix 4/20) ?Hepatic: LFTs improved to WNL; T bili 1.7- rising ?Intake / Output;  UOP 950 ml/24h, drains 810 ml/24h, stool 70/24 hr,  ?Net I/O +577 ml/24hr  ?MIVF:  none ?GI Imaging:  ?4/15 CTA: bowel perforation ?4/17 KUB ileus or pSBO ?2/20 CTA: severe ileus or pSBO ?GI Surgeries / Procedures:  ?10/22/21 s/p EXPLORATORY LAPAROTOMY hartmans for perforated sigmoid colon, Dr. Marcello Moores  ?4/23 IR placed drain LLQ ?Central access: CVC TL placed 10/23/21 ?TPN start date: 10/26/21 ? ?Nutritional Goals: ?Goal TPN rate is 80 mL/hr (provides ~ 111  g of protein and ~ 2001 kcals per day)  ? ?RD Assessment: ?Estimated Needs ?Total Energy Estimated Needs: 1900-2150 kcal ?Total Protein Estimated Needs: 100-120 grams ?Total Fluid Estimated Needs: >/= 2 L/day ? ?Current Nutrition:  ?TPN @ 80 ml/hr  ? ? ?Plan:  ?Now: 2 runs of K  ?Continue TPN @  80 mL/hr to provide 100% of goals ?Electrolytes in TPN: Na 33mq/L, K 570m/L, Ca 58m17mL, Mg 58mE558m, and Phos 8mmo76m. Cl:Ac 1:1 ?Add standard MVI and trace elements to TPN ?continue folic acid & thiamine in TPN ?Continue Sensitive q6h SSI and adjust as needed ?Monitor TPN labs on Mon/Thurs ? ?MicheEudelia Bunchrm.D ?10/30/2021 7:18 AM ? ?

## 2021-10-30 NOTE — Progress Notes (Signed)
Echocardiogram ?2D Echocardiogram has been performed. ? ?Arlyss Gandy ?10/30/2021, 8:15 AM ?

## 2021-10-30 NOTE — Progress Notes (Addendum)
? ?Jessica [name] ?[id number] ?Aug 19, 1960 ? ?CARE TEAM: ? ?PCP: Deland Pretty, MD ? ?Outpatient Care Team: Patient Care Team: ?Deland Pretty, MD as PCP - General (Internal Medicine) ? ?Inpatient Treatment Team: Treatment Team: Attending Provider: Brand Males, MD; Technician: Mee Hives, NT; Rounding Team: Pccm, Md, MD; Consulting Physician: Edison Pace, Md, MD; [place] Nurse: McNichol, Rudi Heap, RN; Rounding Team: Dorthy Cooler Radiology, MD; Registered Nurse: Artis Flock, RN; Registered Nurse: Stevenson Clinch, RN; Charge Nurse: Kathy Breach, RN; Pharmacist: Eudelia Bunch, [facility]; Charge Nurse: May, Barbara J, RN; Utilization Review: Claudie Leach, RN; Registered Nurse: [place], [place] B, RN ? ? ?Problem List:  ? ?Principal Problem: ?  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  Delirium tremens ([place]) ?  Substance induced mood disorder ([place]) ?  Anxiety ?  Chronic pain ?  Primary insomnia ?  Tobacco user ?  Underweight ?  Protein-calorie malnutrition, severe ([place]) ?  Colostomy in place [facility]) ?  AKI (acute kidney injury) ([place]) ?Gas tub in pla ? ? ?8 Days Post-Op  10/22/2021 ?POST-OPERATIVE DIAGNOSIS:  PERFORATED SIGMOID COLON, MALNUTRITION ?  ?PROCEDURE:   ?EXPLORATORY LAPAROTOMY ?HARTMAN'S PROCEDURE (SIGMOID COLECTOMY/COLOSTOMY) ?INSERTION OF FEEDING G TUBE ?  ?SURGEON: :Leighton Ruff, MD ? ?OR FINDINGS: Purulent ascites throughout the abdomen and stool contamination in the pelvis due to necrotic perforated sigmoid colon ? ?FINAL MICROSCOPIC DIAGNOSIS:  ? ?COLON, SIGMOID, RESECTION:  ?- Diverticulitis with perforation, pericolonic abscess and acute  ?serositis.  ?- Negative for malignancy.  ? ?Assessment ? ?Guarded but slowly stabilizing. ? ?(Hospital Stay = 8 days) ? ?Plan: ? ?Status post drainage of left lower quadrant fluid collection.  Looks like infected ascites. ? ?Weaned off pressors and encouraging sign. ? ?Still with no definite return of bowel  function.  Continue gastric tube to low minimum wall suction.  Once gets flatus and bowel movements, can try clamping trials and tube feeds/p.o. trial.  Hopefully in the next few days. ? ?Continue IV antibiotics for feculent peritonitis.  Would continue 5 days past drain placement = 4/27 in the hopes that source control is improved.  Follow-up on cultures. ? ?Continue gastrostomy tube to low intermittent wall suction given significant ileus and episode of emesis yesterday. ? ?No convincing evidence of aspiration pneumonitis or hypoxia. ? ?Delirium most likely in the setting of alcohol and substance withdrawal.  On Precedex drip.  Continue ICU care per primary service. ? ?Wound VAC for midline incision although superficial will help it to granulate and close and minimize risk of dehiscence.  Hopefully decompressing the abdomen with the drain will help as well. ? ?Colostomy care.  Ostomy viable but no evidence of flatus/BM yet ? ?Continue full nutrition with TPN parenteral nutrition.  If output from gastrostomy tube goes down and patient has definite output from colostomy with flatus and stool, can consider clamping trials and then eventually p.o. or G-tube feeding.  Most likely will take several days.  We will see. ? ?Gastrostomy tube care.  Placed given concerns of distal esophageal stricture that would not allow nasogastric tube to pass.  Continue that until nutrition normal and eating normally.  Suspect that will be in 6-8 weeks minimum. ? ?Most likely would benefit from EGD & lower endoscopy when she gets through this severe phase to rule out any abnormalities in any esophageal stricture from above.  Make sure there are no other pathologies before considering colostomy takedown.  Usually wait about 6-8 weeks from discharge.  Given her severely  sickly malnourished state, I suspect it will be 6 months from discharge for her to recover and for infection/adhesions to be minimal before considering colostomy takedown.   Defer to operating surgeon, Dr. Marcello Moores. ? ? ?FEN - g-tube to suction, TPN for severe protein calorie malnutrition, fluids per primary.  Do Lasix x1 is a challenge since nonoliguric renal failure and 9 L positive overall ?VTE - on hold due to thrombocytopenia & need for drain placement ?ID - zosyn 4/17>>.  Most likely plan stopping 5 days after drain after drain placement = 4/27 ?Renal: Still has elevated creatinine but not oliguric.  Hopefully will improve over time.  Do Lasix challenge x1 since very positive on I&O's and will encourage ileus if persistently anasarcic/fluid overloaded ?MMP - per primary service ?PCM - TPN  ?  ?Ongoing post-op ileus, AKI (UOP improvced but Cr still > 2.0). Repeat CT abd pelvis today to look for post-op abscess/further evaluation ileus and distention.  ?  ? ? ? ? ? ? ?I reviewed nursing notes, last 24 h vitals and pain scores, last 48 h intake and output, last 24 h labs and trends, last 24 h imaging results, and CCM . I have reviewed this patient's available data, including medical history, events of note, test results, etc as part of my evaluation.  A significant portion of that time was spent in counseling.  Care during the described time interval was provided by me. ? ?This care required high  level of medical decision making.  10/30/2021 ? ? ? ?Subjective: ?(Chief complaint) ? ?Patient remains extubated. ? ?ICU nursing at bedside. ? ?Some confusion and moaning.  Initially more alert now more mumbling. ? ?Remains on Precedex. ? ?Foley catheter removed and has urine awake.  Some decent output but challenged to do I's and O's. ? ?CT scan done yesterday.  Results discussed and plan for drain this morning. ? ?Objective: ? ?Vital signs: ? ?Vitals:  ? 10/30/21 0339 10/30/21 0350 10/30/21 0358 10/30/21 0400  ?BP:  132/74  137/73  ?Pulse:  (!) 109  (!) 115  ?Resp:  (!) 21  18  ?Temp: 98.5 ?F (36.9 ?C)     ?TempSrc: Axillary     ?SpO2:  100%  100%  ?Weight:   59.8 kg   ?Height:       ? ? ?Last BM Date : 10/25/21 ? ?Intake/Output  ? ?Yesterday: ? 04/22 0701 - 04/23 0700 ?In: 2407.1 [I.V.:889.5; Blood:464.2; IV Piggyback:933.4] ?Out: 6948 [Urine:950; Drains:810; Stool:70] ?This shift: ? No intake/output data recorded. ? ?Bowel function: ? Flatus: No ? BM:  No ? Drain: Serous ? ? ?Physical Exam: ? ?General: Pt awake/alert in moderate acute distress ?Eyes: PERRL, normal EOM.  Sclera clear.  No icterus ?Neuro: CN II-XII intact w/o focal sensory/motor deficits. ?Lymph: No head/neck/groin lymphadenopathy ?Psych:  ++delerium.  Oriented x 1 ?HENT: Normocephalic, Mucus membranes moist.  No thrush ?Neck: Supple, No tracheal deviation.  No obvious thyromegaly ?Chest: No pain to chest wall compression.  Good respiratory excursion.  No audible wheezing ?CV:  Pulses intact.  Regular rhythm.  No major extremity edema ?MS: Normal AROM mjr joints.  No obvious deformity ? ?Abdomen: Soft.  Moderately distended.  Mild discomfort in lower abdomen but no guarding.  Wound VAC in place.  Colostomy pink with edema.  No flatus or stool in bag.  Right-sided Blake surgical drain in place with serous output..  No evidence of peritonitis.  No incarcerated hernias. ? ?GU: Urine wick in place. ?Ext:  No deformity.  No mjr edema.  No cyanosis.  Foot supports in place. ?Skin: No petechiae / purpurea.  No major sores.  Warm and dry ? ? ? ?Results:  ? ?SURGICAL PATHOLOGY  ?CASE: [id number]  ?PATIENT: Jessica [name]  ?Surgical Pathology Report  ? ? ? ? ?Clinical History: Free air Banker)  ? ? ? ? ?FINAL MICROSCOPIC DIAGNOSIS:  ? ?A. COLON, SIGMOID, RESECTION:  ?- Diverticulitis with perforation, pericolonic abscess and acute  ?serositis.  ?- Negative for malignancy.  ? ? ?Kayla Deshaies DESCRIPTION:  ? ?Received fresh is a 24 cm segment of colon, clinically sigmoid colon.  ?The serosa is dull, tan-red and partially covered by an exudate.  In the  ?midportion there is a 3 x 2.5 cm transmural defect extending into the  ?mesentery with  necrosis in the surrounding soft tissue.  The mucosa is  ?edematous, tan to pale green with hemorrhage present at the defect.  ?There are diverticula present.  Sections are submitted in 5 cassettes.  ?1, 2 = margins of

## 2021-10-30 NOTE — Progress Notes (Signed)
eLink Physician-Brief Progress Note ?Patient Name: NATHANIA WALDMAN ?DOB: 02-08-1961 ?MRN: [id number] ? ? ?Date of Service ? 10/30/2021  ?HPI/Events of Note ? Notified of HR 140s. No change in BP. RN had done a 12 lead EKG and then notified us. Seen on camera. HR already down to 100-110 with no intervention. Intermittent agitation is unchanged. No distress on camera. On precedex and not currently on Levophed but has needed it on and off. EKG not in system but RN reports sinus tach and Qtc is 408.   ?eICU Interventions ? Send AM labs now to check for any drop in hemoglobin and to see if K/Mag need repletion. Otherwise ok to monitor. No fever. D/w RN   ? ? ? ?Intervention Category ?Major Interventions: Arrhythmia - evaluation and management ? ?Renee Erb G Davette Nugent ?10/30/2021, 3:27 AM ?

## 2021-10-30 NOTE — Progress Notes (Signed)
? ?NAME:  Jessica [name], MRN:  [id number], DOB:  [birth year], LOS: 8 ?ADMISSION DATE:  10/22/2021, CONSULTATION DATE:  10/22/21 ?REFERRING MD:  Francia Greaves, CHIEF COMPLAINT:  abdominal pain  ? ?History of Present Illness:  ?[name] with history of alcohol use disorder with prior severe withdrawal/DT requiring ICU admission and precedex, smoking who presented to the ED today for severe diffuse abdominal pain that began the night before admission. She had had constipation for 4d PTA. Endorsed frequent alcohol use. Last drink was day PTA.  ? ?In ED she was found to have pneumoperitoneum and free fluid on CT A/P, severe lactic acidosis. She was started on broad spectrum antibiotics and has been given a total of 5L of crystalloid. Surgery was consulted and she was taken to OR where she underwent ex lap and was found to have perforated sigmoid colon, then had hartman's procedure and insertion of g-tube which was left to gravity drainage, JP bulb in pelvis. She was noted to have feculent peritonitis in the pelvis.  ? ?Pertinent  Medical History  ?Etoh use disorder ?History of DT ?Smoking ? ? has no past medical history on file. ? ? has a past surgical history that includes laparotomy (N/A, 10/22/2021). ? ? ?Significant Hospital Events: ?Including procedures, antibiotic start and stop dates in addition to other pertinent events   ?10/22/21 ex lap, hartman's procedure, g tube placement. Extubated in OR ?diazepam 5 mg IV q6h scheduled 4/15>>4/17.  ?Path - diverticulitis with perf, colonic abscess and acute serositi. No malignancy ?4/17 Reintubated overnight with continued pressor support x2. 11 liters positive thus far with progressive AKI this am  ?4/18 Continued issues with electrolyte abnormalities overnight, remains on levo and neo. She is now 14L positive but urine output has increased  ?4/19 No issues overnight, remains 14L positive but urine output continues to improve. Start to slowly diureses today  ?4/20 weaning NE, extubated,  diuresed, started trickle TF, remains on precedex for agitation  ?Ativan 1 mg IV q6h scheduled ordered ?10/28/21 - Still having periods of agitation/ hallucinations, scheduled ativan helps, remains on precedex 0.7 NE at 35mg/ min, slowly improving .  ? ?4/22 - CVP 1. On levophed 569m (was off last night and back on). On  TPN.  Did not tolerated TF yesterday -vomitted and now G tube to LIS with bilios returns.  Low grade fever +. WBC now normal. On Zosyn.  On 2L [place]. Hgb < 7g% without overt bleeding - 1 U PRBC ordered., Plat improved from 3063so 5010sAKI better - creat down to 1.97 . On prn morphone not gotten and scheduled benzo due to etoh hx . Precedex continues. Obtunded delirum worse today.  ?CT ABDOMEN PELVIS WO CONTRAST = esult Date: 10/28/2021 -  IMPRESSION: Findings suspicious for partial small bowel obstruction, with transition point in the lower central pelvis. Increased small to moderate amount of ascites, without definite focal abscess collection. Small amount of postop free air also noted. New small left pleural effusion and left lower lobe atelectasis. New diffuse body wall edema, consistent with anasarca. Cholelithiasis.  ?S/ remoe R JP drain and place new LLQ 10 F [place] by IR ?Culture sent ?Interim History / Subjective:  ? ?4/23 -  On 2L [place].  Off levophed. Remains on precedex gtt with prn benzo/opioid/haldo. Still with muttering delirium.  R JP drain out and has new LLQ drain. - > per CCS looks like infected ascites On TPN. Per CCS - still no return of bowel function. AM- cortisol 21.7 ? ?  Objective   ?Blood pressure (!) 158/78, pulse (!) 123, temperature 99.2 ?F (37.3 ?C), temperature source Axillary, resp. rate (!) 23, height 5' 10.5" (1.791 m), weight 59.8 kg, SpO2 100 %. ?CVP:  [6 mmHg-7 mmHg] 6 mmHg  ?   ? ?Intake/Output Summary (Last 24 hours) at 10/30/2021 0948 ?Last data filed at 10/30/2021 0900 ?Gross per 24 hour  ?Intake 2901.21 ml  ?Output 1830 ml  ?Net 1071.21 ml  ? ?Filed Weights  ?  10/28/21 0500 10/29/21 0500 10/30/21 0358  ?Weight: 70.9 kg 67.9 kg 59.8 kg  ? ?General Appearance: Marland Kitchen Very cachectic. Looks malnourished ?Head:  Normocephalic, without obvious abnormality, atraumatic ?Eyes:  PERRL - yes, conjunctiva/corneas - muddy     ?Ears:  Normal external ear canals, both ears ?Nose:  G tube - no but has [place] o2 ?Throat:  ETT TUBE - no , OG tube - no., WEAK COUGH + ?Neck:  Supple,  No enlargement/tenderness/nodules ?Lungs: Clear to auscultation bilaterally, V ?Heart:  S1 and S2 normal, no murmur, CVP - LOW.  Pressors - Off levophed ?Abdomen:  Soft, no masses, no organomegaly ?Genitalia / Rectal:  Not done ?Extremities:  Extremities- intact ?Skin:  ntact in exposed areas . Sacral area - not examined ?Neurologic:  Sedation - precddex gtt, RASS - -2 . Moves all 4s - yes. CAM-ICU - POSITIVE . Orientation - MUTTERING and ? ?DRAINS - LLQ, Wound vac, G tube  ? ? ?Resolved Hospital Problem list   ?[place] ?Lactic acidosis  ?Transaminitis  - Present on Admit  - -Early shock/sepsis vs alcohol related. Resolved 10/27/21 ?Shock 10/29/21 ? ?Assessment & Plan:  ? ?Septic shock due to feculent peritonitis with sigmoid perforation   - -s/p hartman's procedure, g-tube placement 4/15 - Present on Admit ? ? ? ?10/30/2021 -  Off pressors since yesterday after fluid bolus and PRBC 10/29/21. Cortisol - high ? ?P: ?Monitor without need for steroids ?Goal MAP > 65 ?Transition to PICC when AKI improved  ?Hold diuresis ?  ?Feculent Peritonitis, sigmoid perf and P/lap - Present on Admit ?- colostomy + ?- wound vac + ?- G tube + ?- R JP drain - dc'ed 4/22 ?- LLQ drain + new for infected ascited 10/29/21  ? ?4/23 - no bowel function return ? ?Plan ? - per CCS ? - IV abx for 5 days past  LLQ drain placement - approx 11/03/21 ? - continue wound vac ?  - continue colostomy care ? - G tube to LIS ? - Will need EGD and colonoscopy post discharge (anticipated 8 weeks from late April 2023) ? ? ?Klebsiella Oxytoca bacteremia  - -Resistant to  Ampicillin only  ? ?10/30/2021 - low grade fever  but improiving ? ? ?Plan ?- Cefoxitin 4/15 - 4/15 ?- cefepime 4/15 - 4/17 ?- Vanc 4/15 -  4/16 ?- Zosyn 4/17 - 4/20 ?- Ceftriaxone 4/17 - 4/17, 4/20 - 4/20 ? - - flagyl 4/15 - 4/17,  4/20 - 4/20 ?- Zosyn 4/20 - (4/27) ? ? ? ?Acute Hypoxic Respiratory Failure  - -Secondary to above, re-intubated overnight 4/16. Extubated 10/27/21 ? ?10/30/2021 - > on 2L [place] and weak cough +. Protecting airway +. BNP 183. ECHo results - pending ? ? ?P: ?Wean supplemental O2 for sat goal > 92% ?Aggressive pulmonary hygiene- IS, mobilize as able but delirium a barrier  ?Lasix '40mg'$  x 1 10/30/21 (now off pressors) already given ? ? ?Alcohol use and History of DT - Prior to & Present on Admit (has needed prcedex  prior admits) ?Acute encephalopathy - post admit ? ?10/30/2021 - persisent delirium but ? Some better after stopping scheduled ativan 10/29/21 ? ?P: ?Continue Precedex drip for CIWA ?Versed prn ?Fent prn in setting of AKI ?Haldol prn with QTc monitoring (QTc 408 msec on 4;/23/23) ?Supplement IV thiamine, folate, MVI ? ?AKI (baseline creat 0,'57mg'$  in 2019) - Present on Admit 10/22/2021 - creat 1.'5mg'$ % at admit. Peak creat 2,2 on 4.20/23 ? ?- .4/23 - creat '2mg'$  % with BUN 66. BNP 183 and lasix given ? ?P: ?- monitor after lasix ?- avoid nephrotoxins ?- maintain BP ? ?Transaminitis  - Present on Admit  - -Early shock/sepsis vs alcohol related. Resolved 10/27/21 ? ?4/22- ECHO no report of mass or cirrhosis ? ?P: ?Avoid hepatotoxins  ?Monitor ? ? ? ?Thrombocytopenia  - Likely 2/2 to septic shock.  No evidence of bleeding/ clotting. Has been on SCDs only for VTEx.    Zosyn may be contributing.  ? ?4/23 - improving rapidly. 90s ? ?plan ? - start heparin for dvt proph 10/30/21 ? ?Normocytic anemia - Prior to & Present on Admit - baseline 9-10gm% in 2019 and 11gm% on admit/ sp PRBC 1 unit 10/29/21 for hgb  <7gm% ? ?10/30/2021 - no active bleed. Hgb responded to PRBC ? ?Plan ?- - PRBC for hgb </= 6.9gm%    ? - exceptions are ?  -  if ACS susepcted/confirmed then transfuse for hgb </= 8.0gm%,  or  ?  -  active bleeding with hemodynamic instability, then transfuse regardless of hemoglobin value ?  At at all time

## 2021-10-30 NOTE — Progress Notes (Signed)
? ? ?Referring Physician(s): ?Gross,S ? ?Supervising Physician: Corrie Mckusick ? ?Patient Status:  [facility] - In-pt ? ?Chief Complaint: ?Abdominal pain, abdominal fluid collection/ascites ? ? ?Subjective: ?Pt lethargic, mumbling words, does follow simple commands, moving all fours, rt eyelid lag; on levo, precedex, zosyn; remains tachy ? ? ?Allergies: ?Patient has no known allergies. ? ?Medications: ?Prior to Admission medications   ?Medication Sig Start Date End Date Taking? Authorizing Provider  ?fluticasone (FLONASE) 50 MCG/ACT nasal spray Place 1-2 sprays into both nostrils daily as needed for allergies or rhinitis.   Yes [provider]  ?loratadine (CLARITIN) 10 MG tablet Take 10 mg by mouth daily as needed for allergies.   Yes [provider]  ?Multiple Vitamins-Minerals (MULTIVITAMIN ADULTS 50+) TABS Take 1 tablet by mouth every morning.   Yes [provider]  ?ondansetron (ZOFRAN) 8 MG tablet Take 8 mg by mouth daily as needed for nausea/vomiting. 10/10/21  Yes [provider]  ?cetirizine (ZYRTEC) 10 MG tablet Take 10 mg by mouth daily as needed for allergies.    [provider]  ?diclofenac Sodium (VOLTAREN) 1 % GEL Apply 4 g topically 4 (four) times daily. ?Patient not taking: Reported on 10/22/2021 09/21/21   Deno Etienne, DO  ?mirtazapine (REMERON SOL-TAB) 15 MG disintegrating tablet Take 1 tablet (15 mg total) by mouth at bedtime. 12/12/17 01/11/18  Arrien, Jimmy Picket, MD  ?morphine (MSIR) 15 MG tablet Take 0.5 tablets (7.5 mg total) by mouth every 4 (four) hours as needed for severe pain. ?Patient not taking: Reported on 10/22/2021 09/26/21   Vanetta Mulders, MD  ? ? ? ?Vital Signs: ?BP (!) 158/78   Pulse (!) 123   Temp 99.2 ?F (37.3 ?C) (Axillary)   Resp (!) 23   Ht 5' 10.5" (1.791 m)   Wt 131 lb 13.4 oz (59.8 kg)   SpO2 100%   BMI 18.65 kg/m?  ? ?Physical Exam: Pt lethargic, mumbling words, does follow simple commands, moving all fours,  rt eyelid lag; LLQ  drain intact, insertion site ok but some drainage noted on overlying gauze; output 460 cc, drain flushed with minimal return, turbid,beige fluid in JP ? ?Imaging: ?CT ABDOMEN PELVIS WO CONTRAST ? ?Result Date: 10/28/2021 ?CLINICAL DATA:  Postop from colon resection and colostomy for perforated sigmoid diverticulitis. Severe ileus. Suspected abscess. Recent percutaneous gastrostomy tube placement. EXAM: CT ABDOMEN AND PELVIS WITHOUT CONTRAST TECHNIQUE: Multidetector CT imaging of the abdomen and pelvis was performed following the standard protocol without IV contrast. RADIATION DOSE REDUCTION: This exam was performed according to the departmental dose-optimization program which includes automated exposure control, adjustment of the mA and/or kV according to patient size and/or use of iterative reconstruction technique. COMPARISON:  10/22/2021 FINDINGS: Lower chest: New small left pleural effusion and left lower lobe atelectasis. Hepatobiliary: No mass visualized on this unenhanced exam. Small less than 1 cm calcified gallstone again seen, however there is no evidence of cholecystitis or biliary ductal dilatation. Pancreas: No mass or inflammatory process visualized on this unenhanced exam. Spleen:  Within normal limits in size. Adrenals/Urinary tract: No evidence of urolithiasis or hydronephrosis. Foley catheter is seen within the bladder which is decompressed. Stomach/Bowel: Small amount of residual postop free air is seen. New percutaneous gastrostomy tube is seen in appropriate position. Moderate to severe dilatation of small bowel is seen, with transition point to nondilated distal small bowel in the central lower pelvis, suspicious for partial small bowel obstruction. Some oral contrast material reaches the right colon which is nondilated. Left  lower quadrant colostomy is seen. Moderate wall thickening and diverticulosis is seen involving the proximal sigmoid colon proximal to the colostomy, which remains  consistent with diverticulitis. A surgical drain is seen in the dependent portion of the pelvis. Small to moderate amount of ascites is seen mainly in the pelvis, without definite focal abscess collection. Vascular/Lymphatic: No pathologically enlarged lymph nodes identified. No evidence of abdominal aortic aneurysm. Aortic atherosclerotic calcification noted. Reproductive:  No mass identified. Other:  Diffuse body wall edema is new since prior exam. Musculoskeletal:  No suspicious bone lesions identified. IMPRESSION: Findings suspicious for partial small bowel obstruction, with transition point in the lower central pelvis. Increased small to moderate amount of ascites, without definite focal abscess collection. Small amount of postop free air also noted. New small left pleural effusion and left lower lobe atelectasis. New diffuse body wall edema, consistent with anasarca. Cholelithiasis. No radiographic evidence of cholecystitis. Aortic Atherosclerosis (ICD10-I70.0). Electronically Signed   By: Marlaine Hind M.D.   On: 10/28/2021 16:48  ? ?DG CHEST PORT 1 VIEW ? ?Result Date: 10/28/2021 ?CLINICAL DATA:  A 61 year old female presents for evaluation of vomiting. EXAM: PORTABLE CHEST 1 VIEW COMPARISON:  October 27, 2021. FINDINGS: RIGHT-sided central venous access device, catheter remaining in place tip projects over the distal superior vena cava. Endotracheal tube has been removed. EKG leads project over the patient's chest. Cardiomediastinal contours and hilar structures are normal. Slight increased density in the retrocardiac region. No lobar consolidation. No sign of pneumothorax. On limited assessment there is no acute skeletal process with redemonstration of RIGHT humeral fracture. IMPRESSION: 1. Slight increased density in the retrocardiac region, atelectasis not substantially changed from previous imaging, attention on follow-up. 2. Post extubation. Electronically Signed   By: Zetta Bills M.D.   On: 10/28/2021  15:00  ? ?DG CHEST PORT 1 VIEW ? ?Result Date: 10/27/2021 ?CLINICAL DATA:  Endotracheal tube present. EXAM: PORTABLE CHEST 1 VIEW COMPARISON:  AP chest 10/24/2021; right shoulder radiographs 09/19/2021 FINDINGS: Endotracheal tube tip terminates approximately 4.8 cm above the carina, at the inferior aspect of the clavicular heads and similar to prior. Right subclavian central venous catheter tip again overlies the mid superior vena cava. Interval removal of nasogastric tube. Cardiac silhouette and mediastinal contours are within normal limits with mild calcification within aortic arch. The lungs are clear. No pleural effusion or pneumothorax. There is a displaced impacted right proximal humeral fracture with involvement of the greater tuberosity and surgical neck, similar to 09/19/2021 dedicated right shoulder radiographs. IMPRESSION:: IMPRESSION: 1. Endotracheal tube in appropriate position. 2. Clear lungs. 3. Subacute, displaced, comminuted, and impacted proximal right humeral fracture. Electronically Signed   By: Yvonne Kendall M.D.   On: 10/27/2021 13:37  ? ?US Abdomen Limited RUQ (LIVER/GB) ? ?Result Date: 10/29/2021 ?CLINICAL DATA:  Abdominal pain EXAM: ULTRASOUND ABDOMEN LIMITED RIGHT UPPER QUADRANT COMPARISON:  CT examination dated October 28, 2021 FINDINGS: Gallbladder: 0.9 cm gallstone. No gallbladder wall thickening. Trace amount of pericholecystic fluid, nonspecific in the setting of ascites. No sonographic Murphy sign noted by sonographer. Common bile duct: Diameter: 2 mm Liver: No focal lesion identified. Within normal limits in parenchymal echogenicity. Portal vein is patent on color Doppler imaging with normal direction of blood flow towards the liver. Other: None. IMPRESSION: 1. Cholelithiasis without definite evidence of acute cholecystitis. 2. No appreciable hepatic mass. 3. Mild ascites. Electronically Signed   By: Keane Police D.O.   On: 10/29/2021 12:59   ? ?Labs: ? ?CBC: ?Recent Labs  ?  10/27/21 ?0401 10/28/21 ?0302 10/29/21 ?0336 10/29/21 ?1755 10/30/21 ?0337  ?WBC 9.0 10.8* 9.7  --  8.1  ?HGB 8.4* 7.8* 6.6* 9.3* 8.8*  ?HCT 23.9* 22.0* 19.2* 27.3* 27.0*  ?PLT 33* 34* 56*  --  92*  ? ? ?COAGS: ?Recent Labs  ?

## 2021-10-31 DIAGNOSIS — E440 Moderate protein-calorie malnutrition: Secondary | ICD-10-CM | POA: Insufficient documentation

## 2021-10-31 LAB — COMPREHENSIVE METABOLIC PANEL
ALT: 15 U/L (ref 0–44)
ALT: 12 U/L (ref 0–44)
AST: 18 U/L (ref 15–41)
AST: 18 U/L (ref 15–41)
Albumin: 1.7 g/dL — ABNORMAL LOW (ref 3.5–5.0)
Albumin: 2.6 g/dL — ABNORMAL LOW (ref 3.5–5.0)
Alkaline Phosphatase: 37 U/L — ABNORMAL LOW (ref 38–126)
Alkaline Phosphatase: 35 U/L — ABNORMAL LOW (ref 38–126)
Anion gap: 9 (ref 5–15)
Anion gap: 9 (ref 5–15)
BUN: 75 mg/dL — ABNORMAL HIGH (ref 6–20)
BUN: 76 mg/dL — ABNORMAL HIGH (ref 6–20)
CO2: 28 mmol/L (ref 22–32)
CO2: 25 mmol/L (ref 22–32)
Calcium: 7.9 mg/dL — ABNORMAL LOW (ref 8.9–10.3)
Calcium: 8.1 mg/dL — ABNORMAL LOW (ref 8.9–10.3)
Chloride: 112 mmol/L — ABNORMAL HIGH (ref 98–111)
Chloride: 114 mmol/L — ABNORMAL HIGH (ref 98–111)
Creatinine, Ser: 2.02 mg/dL — ABNORMAL HIGH (ref 0.44–1.00)
Creatinine, Ser: 2.01 mg/dL — ABNORMAL HIGH (ref 0.44–1.00)
GFR, Estimated: 28 mL/min — ABNORMAL LOW (ref >60)
GFR, Estimated: 28 mL/min — ABNORMAL LOW (ref >60)
Glucose, Bld: 132 mg/dL — ABNORMAL HIGH (ref 70–99)
Glucose, Bld: 179 mg/dL — ABNORMAL HIGH (ref 70–99)
Potassium: 3.4 mmol/L — ABNORMAL LOW (ref 3.5–5.1)
Potassium: 3.6 mmol/L (ref 3.5–5.1)
Sodium: 149 mmol/L — ABNORMAL HIGH (ref 135–145)
Sodium: 148 mmol/L — ABNORMAL HIGH (ref 135–145)
Total Bilirubin: 2 mg/dL — ABNORMAL HIGH (ref 0.3–1.2)
Total Bilirubin: 2.2 mg/dL — ABNORMAL HIGH (ref 0.3–1.2)
Total Protein: 5.2 g/dL — ABNORMAL LOW (ref 6.5–8.1)
Total Protein: 5.6 g/dL — ABNORMAL LOW (ref 6.5–8.1)

## 2021-10-31 LAB — PHOSPHORUS: Phosphorus: 4 mg/dL (ref 2.5–4.6)

## 2021-10-31 LAB — BASIC METABOLIC PANEL
Anion gap: 8 (ref 5–15)
BUN: 75 mg/dL — ABNORMAL HIGH (ref 6–20)
CO2: 26 mmol/L (ref 22–32)
Calcium: 7.8 mg/dL — ABNORMAL LOW (ref 8.9–10.3)
Chloride: 115 mmol/L — ABNORMAL HIGH (ref 98–111)
Creatinine, Ser: 1.98 mg/dL — ABNORMAL HIGH (ref 0.44–1.00)
GFR, Estimated: 28 mL/min — ABNORMAL LOW (ref >60)
Glucose, Bld: 138 mg/dL — ABNORMAL HIGH (ref 70–99)
Potassium: 3.7 mmol/L (ref 3.5–5.1)
Sodium: 149 mmol/L — ABNORMAL HIGH (ref 135–145)

## 2021-10-31 LAB — TRIGLYCERIDES: Triglycerides: 135 mg/dL (ref <150)

## 2021-10-31 LAB — GLUCOSE, CAPILLARY
Glucose-Capillary: 118 mg/dL — ABNORMAL HIGH (ref 70–99)
Glucose-Capillary: 121 mg/dL — ABNORMAL HIGH (ref 70–99)
Glucose-Capillary: 123 mg/dL — ABNORMAL HIGH (ref 70–99)

## 2021-10-31 LAB — CBC
HCT: 26.3 % — ABNORMAL LOW (ref 36.0–46.0)
HCT: 24.7 % — ABNORMAL LOW (ref 36.0–46.0)
Hemoglobin: 8.7 g/dL — ABNORMAL LOW (ref 12.0–15.0)
Hemoglobin: 8.1 g/dL — ABNORMAL LOW (ref 12.0–15.0)
MCH: 29.6 pg (ref 26.0–34.0)
MCH: 29.1 pg (ref 26.0–34.0)
MCHC: 33.1 g/dL (ref 30.0–36.0)
MCHC: 32.8 g/dL (ref 30.0–36.0)
MCV: 89.5 fL (ref 80.0–100.0)
MCV: 88.8 fL (ref 80.0–100.0)
Platelets: 127 10*3/uL — ABNORMAL LOW (ref 150–400)
Platelets: 124 10*3/uL — ABNORMAL LOW (ref 150–400)
RBC: 2.94 MIL/uL — ABNORMAL LOW (ref 3.87–5.11)
RBC: 2.78 MIL/uL — ABNORMAL LOW (ref 3.87–5.11)
RDW: 15.5 % (ref 11.5–15.5)
RDW: 15.6 % — ABNORMAL HIGH (ref 11.5–15.5)
WBC: 8.6 10*3/uL (ref 4.0–10.5)
WBC: 7.1 10*3/uL (ref 4.0–10.5)
nRBC: 0 % (ref 0.0–0.2)
nRBC: 0 % (ref 0.0–0.2)

## 2021-10-31 LAB — PROCALCITONIN: Procalcitonin: 10.44 ng/mL

## 2021-10-31 LAB — MAGNESIUM: Magnesium: 1.9 mg/dL (ref 1.7–2.4)

## 2021-10-31 LAB — VITAMIN A: Vitamin A (Retinoic Acid): 14.2 ug/dL — ABNORMAL LOW (ref 22.0–69.5)

## 2021-10-31 MED ORDER — DEXTROSE 5 % IV BOLUS
500.0000 mL | Freq: Once | INTRAVENOUS | Status: AC
  Administered 2021-10-31: 500 mL via INTRAVENOUS

## 2021-10-31 MED ORDER — LABETALOL HCL 5 MG/ML IV SOLN: 5.0000 mg | INTRAVENOUS | Status: DC | PRN

## 2021-10-31 MED ORDER — LABETALOL HCL 5 MG/ML IV SOLN
5.0000 mg | INTRAVENOUS | Status: DC | PRN
  Administered 2021-10-31 – 2021-11-03 (×5): 5 mg via INTRAVENOUS
  Filled 2021-10-31 (×5): qty 4

## 2021-10-31 MED ORDER — DIAZEPAM 5 MG/ML IJ SOLN
5.0000 mg | INTRAMUSCULAR | Status: DC | PRN
  Administered 2021-10-31 – 2021-11-02 (×4): 5 mg via INTRAVENOUS
  Filled 2021-10-31 (×4): qty 2

## 2021-10-31 MED ORDER — DEXTROSE 5 % IV SOLN: INTRAVENOUS | Status: DC

## 2021-10-31 MED ORDER — POTASSIUM CHLORIDE 10 MEQ/100ML IV SOLN
10.0000 meq | INTRAVENOUS | Status: AC
  Administered 2021-10-31 (×2): 10 meq via INTRAVENOUS
  Filled 2021-10-31 (×2): qty 100

## 2021-10-31 MED ORDER — METOPROLOL TARTRATE 5 MG/5ML IV SOLN
5.0000 mg | Freq: Four times a day (QID) | INTRAVENOUS | Status: DC
  Administered 2021-10-31 – 2021-11-07 (×29): 5 mg via INTRAVENOUS
  Filled 2021-10-31 (×29): qty 5

## 2021-10-31 MED ORDER — METOPROLOL TARTRATE 5 MG/5ML IV SOLN
5.0000 mg | Freq: Once | INTRAVENOUS | Status: AC
  Administered 2021-10-31: 5 mg via INTRAVENOUS
  Filled 2021-10-31: qty 5

## 2021-10-31 MED ORDER — ACETAMINOPHEN 650 MG RE SUPP
650.0000 mg | Freq: Four times a day (QID) | RECTAL | Status: DC | PRN
  Administered 2021-11-07 – 2021-11-08 (×2): 650 mg via RECTAL
  Filled 2021-10-31 (×5): qty 1

## 2021-10-31 MED ORDER — INSULIN ASPART 100 UNIT/ML IJ SOLN
0.0000 [IU] | Freq: Three times a day (TID) | INTRAMUSCULAR | Status: DC
  Administered 2021-11-01 – 2021-11-07 (×13): 1 [IU] via SUBCUTANEOUS

## 2021-10-31 MED ORDER — CLONIDINE HCL 0.2 MG/24HR TD PTWK
0.2000 mg | MEDICATED_PATCH | TRANSDERMAL | Status: DC
  Filled 2021-10-31: qty 1

## 2021-10-31 MED ORDER — HYDRALAZINE HCL 20 MG/ML IJ SOLN
10.0000 mg | INTRAMUSCULAR | Status: DC | PRN
  Administered 2021-10-31 (×2): 10 mg via INTRAVENOUS
  Filled 2021-10-31 (×2): qty 1

## 2021-10-31 MED ORDER — TRAVASOL 10 % IV SOLN
INTRAVENOUS | Status: AC
  Filled 2021-10-31: qty 1113.6

## 2021-10-31 MED ORDER — ALBUMIN HUMAN 25 % IV SOLN
25.0000 g | Freq: Four times a day (QID) | INTRAVENOUS | Status: AC
  Administered 2021-10-31 (×3): 25 g via INTRAVENOUS
  Filled 2021-10-31 (×3): qty 100

## 2021-10-31 MED ORDER — FENTANYL CITRATE PF 50 MCG/ML IJ SOSY
50.0000 ug | PREFILLED_SYRINGE | INTRAMUSCULAR | Status: DC | PRN
Start: 2021-10-31 — End: 2021-11-03
  Administered 2021-10-31 – 2021-11-01 (×2): 50 ug via INTRAVENOUS
  Filled 2021-10-31 (×2): qty 1

## 2021-10-31 MED ORDER — ACETAMINOPHEN 10 MG/ML IV SOLN
1000.0000 mg | Freq: Four times a day (QID) | INTRAVENOUS | Status: AC
  Administered 2021-10-31 – 2021-11-01 (×2): 1000 mg via INTRAVENOUS
  Filled 2021-10-31 (×2): qty 100

## 2021-10-31 NOTE — Progress Notes (Signed)
PHARMACY - TOTAL PARENTERAL NUTRITION CONSULT NOTE  ? ?Indication: Prolonged ileus ? ?Patient Measurements: ?Height: 5' 10.5" (179.1 cm) ?Weight: 59 kg (130 lb 1.1 oz) ?IBW/kg (Calculated) : 69.65 ?TPN AdjBW (KG): 53.8 ?Body mass index is 18.4 kg/m?. ?Usual Weight: 53.8 kg ? ?Assessment: 60 yo F w/ PMH etoh use disorder, smoker, HTN, GERD ?s/p Exp Lap Hartmans 10/22/2021 for perforated sigmoid colon, Dr. Marcello Moores  ? ?Glucose / Insulin: no hx DM.  CBGs <180.  Minimal SSI use - 3 units/24 h ?Electrolytes: Na up to 149 (suspect r/t diuresis, remove Na today, CCM to add D5W if needed), K 3.4 (CCM ordered 20 mEq IV, goal K >=4), Mag 1.9 (goal >=2), CorrCa 9.7, Phos WNL, Cl elevated ?Renal: SCr 2.02, BUN 75 rising ?Hepatic: LFTs improved to WNL; T bili 2- rising ?Intake / Output;  UOP 3350 ml/24h (lasix 4/19, 4/20, 4/23), drains up 1170 ml/24h, stool 90/24 hr ?Net I/O -3L/24hr  ?MIVF:  none ?GI Imaging:  ?4/15 CTA: bowel perforation ?4/17 KUB ileus or pSBO ?2/20 CTA: severe ileus or pSBO ?GI Surgeries / Procedures:  ?10/22/21 s/p EXPLORATORY LAPAROTOMY hartmans for perforated sigmoid colon, Dr. Marcello Moores  ?4/23 IR placed drain LLQ ?Central access: CVC TL placed 10/23/21 ?TPN start date: 10/26/21 ? ?Nutritional Goals: ?Goal TPN rate is 80 mL/hr (provides ~ 111g of protein and ~ 2001 kcals per day)  ? ?RD Assessment: ?Estimated Needs ?Total Energy Estimated Needs: 1900-2150 kcal ?Total Protein Estimated Needs: 100-120 grams ?Total Fluid Estimated Needs: >/= 2 L/day ? ?Current Nutrition:  ?TPN @ 80 ml/hr  ?NPO  ? ?Plan:  ?Now: KCl 36mq IV x2 (in addition to previous 20 for total 40 mEq)  ?Continue TPN @  80 mL/hr to provide 100% of goals ?Electrolytes in TPN: Remove Na, K 561m/L, Ca 76m69mL, Mg 8mE33m, and Phos 8mmo66m. Cl:Ac 1:2 ?Continue standard MVI and trace elements to TPN ?Continue folic acid & thiamine in TPN ?Continue Sensitive q8h SSI and adjust as needed ?Monitor TPN labs on Mon/Thurs ? ?ChrisGretta ArabmD,  BCPS ?Clinical Pharmacist ?[facility] [phone] 8:32 AM ? ? ?

## 2021-10-31 NOTE — Progress Notes (Signed)
PCCM Update: ? ?Patient increasingly tachycardic throughout the day despite addition of beta blockers. ?Will give 587m D5W bolus for free water administration. Patient may require further fluids overnight if heart rate improves with bolus. ?Updated by nursing staff that she is leaking around her G-tube and have paged general surgery team.  ? ?JFreda Jackson MD ?[facility] ?Office: [phone]? ? ?See Amion for personal pager ?PCCM on call pager [phone] 7pm. ?Please call Elink 7p-7a. [phone]? ?

## 2021-10-31 NOTE — Progress Notes (Signed)
Patient g-tube hooked to LIS. Cornett MD with General Surgery paged for concern of increased gastric leakage from g-tube site over last two hours. Nursing to keep site clean and monitor output overnight.  ?

## 2021-10-31 NOTE — Progress Notes (Signed)
OT Cancellation Note ? ?Patient Details ?Name: Jessica [name] ?MRN: [id number] ?DOB: 03/23/1961 ? ? ?Cancelled Treatment:    Reason Eval/Treat Not Completed: Patient not medically ready ?Patient is currently in [facility] at this time. OT to continue to follow and check back as schedule will allow.  ?Mehran Guderian OTR/L, MS ?Acute Rehabilitation Department ?Office# [phone] ?Pager# [phone] ?10/31/2021, 2:17 PM ?

## 2021-10-31 NOTE — Progress Notes (Signed)
? ? ?Referring Physician(s): Dr. Johney Maine ? ?Supervising Physician: Jacqulynn Cadet ? ?Patient Status:  [facility] - In-pt ? ?Chief Complaint: Perforated sigmoid colon s/p exploratory laparotomy, Hartman's procedure and insertion of gastrostomy tube 10/22/21. Post-op fluid collection s/p drain placement in IR 10/28/21.  ? ?Subjective: Patient in bed awake. She seems alert and is trying to talk/answer questions but she is very difficult to understand. Precedex infusing. She is afebrile and without leukocytosis.   ? ?Allergies: ?Patient has no known allergies. ? ?Medications: ?Prior to Admission medications   ?Medication Sig Start Date End Date Taking? Authorizing Provider  ?fluticasone (FLONASE) 50 MCG/ACT nasal spray Place 1-2 sprays into both nostrils daily as needed for allergies or rhinitis.   Yes [provider]  ?loratadine (CLARITIN) 10 MG tablet Take 10 mg by mouth daily as needed for allergies.   Yes [provider]  ?Multiple Vitamins-Minerals (MULTIVITAMIN ADULTS 50+) TABS Take 1 tablet by mouth every morning.   Yes [provider]  ?ondansetron (ZOFRAN) 8 MG tablet Take 8 mg by mouth daily as needed for nausea/vomiting. 10/10/21  Yes [provider]  ?cetirizine (ZYRTEC) 10 MG tablet Take 10 mg by mouth daily as needed for allergies.    [provider]  ?diclofenac Sodium (VOLTAREN) 1 % GEL Apply 4 g topically 4 (four) times daily. ?Patient not taking: Reported on 10/22/2021 09/21/21   Deno Etienne, DO  ?mirtazapine (REMERON SOL-TAB) 15 MG disintegrating tablet Take 1 tablet (15 mg total) by mouth at bedtime. 12/12/17 01/11/18  Arrien, Jimmy Picket, MD  ?morphine (MSIR) 15 MG tablet Take 0.5 tablets (7.5 mg total) by mouth every 4 (four) hours as needed for severe pain. ?Patient not taking: Reported on 10/22/2021 09/26/21   Vanetta Mulders, MD  ? ? ? ?Vital Signs: ?BP (!) 176/93   Pulse (!) 119   Temp 100.3 ?F (37.9 ?C) (Axillary)   Resp (!) 23   Ht 5' 10.5" (1.791 m)   Wt  130 lb 1.1 oz (59 kg)   SpO2 100%   BMI 18.40 kg/m?  ? ?Physical Exam ?Constitutional:   ?   General: She is not in acute distress. ?   Appearance: She is ill-appearing.  ?Abdominal:  ?   Comments: Midline incision to wound vac; colostomy; gastrostomy tube to suction with dark green fluid in suction canister. LLQ drain to suction with approximately 25 ml of tan fluid in bulb.   ?Skin: ?   General: Skin is warm and dry.  ?Neurological:  ?   Mental Status: She is alert.  ?   Comments: Unable to fully assess; unable to understand her speech. She did follow basic commands.   ? ? ?Imaging: ?CT ABDOMEN PELVIS WO CONTRAST ? ?Result Date: 10/28/2021 ?CLINICAL DATA:  Postop from colon resection and colostomy for perforated sigmoid diverticulitis. Severe ileus. Suspected abscess. Recent percutaneous gastrostomy tube placement. EXAM: CT ABDOMEN AND PELVIS WITHOUT CONTRAST TECHNIQUE: Multidetector CT imaging of the abdomen and pelvis was performed following the standard protocol without IV contrast. RADIATION DOSE REDUCTION: This exam was performed according to the departmental dose-optimization program which includes automated exposure control, adjustment of the mA and/or kV according to patient size and/or use of iterative reconstruction technique. COMPARISON:  10/22/2021 FINDINGS: Lower chest: New small left pleural effusion and left lower lobe atelectasis. Hepatobiliary: No mass visualized on this unenhanced exam. Small less than 1 cm calcified gallstone again seen, however there is no evidence of cholecystitis or biliary ductal dilatation. Pancreas: No mass or inflammatory  process visualized on this unenhanced exam. Spleen:  Within normal limits in size. Adrenals/Urinary tract: No evidence of urolithiasis or hydronephrosis. Foley catheter is seen within the bladder which is decompressed. Stomach/Bowel: Small amount of residual postop free air is seen. New percutaneous gastrostomy tube is seen in appropriate position.  Moderate to severe dilatation of small bowel is seen, with transition point to nondilated distal small bowel in the central lower pelvis, suspicious for partial small bowel obstruction. Some oral contrast material reaches the right colon which is nondilated. Left lower quadrant colostomy is seen. Moderate wall thickening and diverticulosis is seen involving the proximal sigmoid colon proximal to the colostomy, which remains consistent with diverticulitis. A surgical drain is seen in the dependent portion of the pelvis. Small to moderate amount of ascites is seen mainly in the pelvis, without definite focal abscess collection. Vascular/Lymphatic: No pathologically enlarged lymph nodes identified. No evidence of abdominal aortic aneurysm. Aortic atherosclerotic calcification noted. Reproductive:  No mass identified. Other:  Diffuse body wall edema is new since prior exam. Musculoskeletal:  No suspicious bone lesions identified. IMPRESSION: Findings suspicious for partial small bowel obstruction, with transition point in the lower central pelvis. Increased small to moderate amount of ascites, without definite focal abscess collection. Small amount of postop free air also noted. New small left pleural effusion and left lower lobe atelectasis. New diffuse body wall edema, consistent with anasarca. Cholelithiasis. No radiographic evidence of cholecystitis. Aortic Atherosclerosis (ICD10-I70.0). Electronically Signed   By: Marlaine Hind M.D.   On: 10/28/2021 16:48  ? ?DG CHEST PORT 1 VIEW ? ?Result Date: 10/28/2021 ?CLINICAL DATA:  A 61 year old female presents for evaluation of vomiting. EXAM: PORTABLE CHEST 1 VIEW COMPARISON:  October 27, 2021. FINDINGS: RIGHT-sided central venous access device, catheter remaining in place tip projects over the distal superior vena cava. Endotracheal tube has been removed. EKG leads project over the patient's chest. Cardiomediastinal contours and hilar structures are normal. Slight increased  density in the retrocardiac region. No lobar consolidation. No sign of pneumothorax. On limited assessment there is no acute skeletal process with redemonstration of RIGHT humeral fracture. IMPRESSION: 1. Slight increased density in the retrocardiac region, atelectasis not substantially changed from previous imaging, attention on follow-up. 2. Post extubation. Electronically Signed   By: Zetta Bills M.D.   On: 10/28/2021 15:00  ? ?DG CHEST PORT 1 VIEW ? ?Result Date: 10/27/2021 ?CLINICAL DATA:  Endotracheal tube present. EXAM: PORTABLE CHEST 1 VIEW COMPARISON:  AP chest 10/24/2021; right shoulder radiographs 09/19/2021 FINDINGS: Endotracheal tube tip terminates approximately 4.8 cm above the carina, at the inferior aspect of the clavicular heads and similar to prior. Right subclavian central venous catheter tip again overlies the mid superior vena cava. Interval removal of nasogastric tube. Cardiac silhouette and mediastinal contours are within normal limits with mild calcification within aortic arch. The lungs are clear. No pleural effusion or pneumothorax. There is a displaced impacted right proximal humeral fracture with involvement of the greater tuberosity and surgical neck, similar to 09/19/2021 dedicated right shoulder radiographs. IMPRESSION:: IMPRESSION: 1. Endotracheal tube in appropriate position. 2. Clear lungs. 3. Subacute, displaced, comminuted, and impacted proximal right humeral fracture. Electronically Signed   By: Yvonne Kendall M.D.   On: 10/27/2021 13:37  ? ?ECHOCARDIOGRAM COMPLETE ? ?Result Date: 10/30/2021 ?   ECHOCARDIOGRAM REPORT   Patient Name:   Jessica [name] Date of Exam: 10/30/2021 Medical Rec #:  [id number]       Height:       70.5 in  Accession #:    [id number]      Weight:       131.8 lb Date of Birth:  04-18-1961        BSA:          1.758 m? Patient Age:    61 years        BP:           137/73 mmHg Patient Gender: F               HR:           115 bpm. Exam Location:  Inpatient  Procedure: 2D Echo Indications:    Cardiomyopathy  History:        Patient has no prior history of Echocardiogram examinations.  Sonographer:    Arlyss Gandy Referring Phys: [place]  Sonographer Comments:

## 2021-10-31 NOTE — Consult Note (Signed)
[place] Nurse ostomy follow up: Assessed with [facility] PA-C K. Johnson and Dr. Clent Demark.  ?Stoma created emergently by Dr. Marcello Moores on 10/22/21. ? ?Stoma type/location: LLQ end colostomy ?Stomal assessment/size: 1 and 3/4 inch round, 75% red, 25% sloughing mucosa ?Peristomal assessment: Drying blood filled blister at 9 o'clock, necrotic place in the immediate peristomal area at 9 o'clock measuring 1.4cm x 2cm with yellow wound bed ?Treatment options for stomal/peristomal skin: Skin barrier ring ?Output: bloody, small amount of stool ?Ostomy pouching: 2pc. 2 and 3/4 inch pouching system with skin barrier ring ?Education provided: None. Patient is confused and in ICU. No family present. Patient is wearing mittens for safety.  ?Enrolled patient in [place] [facility] Discharge program: Yes ? ?[facility] Nurse wound follow up ?Wound type:Midline surgical ?Measurement: ?Wound bed: 18cm x 5cm with >75% of wound bed covered in fibrinous sough, so true depth is obscured. 1cm undermining at both 12 and 6 o'clock ?Drainage (amount, consistency, odor) small serous ?Periwound: intact. Patient's abdomen is distended. NGT was not functioning. ?Dressing procedure/placement/frequency: Patient is premedicated with Fentanyl. One piece of black foam removed from wound. Wound cleansed. One piece of black foam used to obliterate dead space. Secured with drape, dressing then attached to 162mHg continuous negative pressure. An immediate seal is achieved.  ? ?Next dressing change is scheduled for Wednesday, 11/02/21.  Supplies in room (2 ostomy pouching set-ups, 2 medium black foam dressing kits are ordered for Wednesday and Friday dressing changes. ? ?[facility] will follow, and will remain available to this patient, the nursing and medical teams.  ?  ?Thank you for allowing [facility] participate in this patient's care. ? ?LMaudie Flakes MSN, RN, [place] [facility], [place] ?Pager# [phone] ? ?  ?

## 2021-10-31 NOTE — Progress Notes (Signed)
eLink Physician-Brief Progress Note ?Patient Name: Jessica [name] ?DOB: 09-17-1960 ?MRN: [id number] ? ? ?Date of Service ? 10/31/2021  ?HPI/Events of Note ? Patient with tachycardia and hypertension in the context of reporting significant abdominal pain, approximately 50 ml of bilious drainage around her G- Tube, along with 100 ml via the lumen of the tube in 2 hours since the shift started at 7 pm ( per bedside RN).  ?eICU Interventions ? Will order Ofirmev a 2 doses over night to eliminate pain as etiology of tachycardia / hypertension, if output around the G-Tube increases will call the surgeon to re-evaluate.  ? ? ? ?  ? ?Kerry Kass Sargon Scouten ?10/31/2021, 8:49 PM ?

## 2021-10-31 NOTE — Progress Notes (Addendum)
eLink Physician-Brief Progress Note ?Patient Name: NEVIN KOZUCH ?DOB: October 26, 1960 ?MRN: [id number] ? ? ?Date of Service ? 10/31/2021  ?HPI/Events of Note ? Hypertension. Seen on camera. No distress. On 0.7 mic of precedex. HR has been 110-120 throughout. SBP is 168. RN had given a dose of fentanyl, haldol as well as versed. She is not agitated and has had high Bps all day yesterday as well. Was taken off pressors yesterday AM. Does not have a charted history of HTN.   ?eICU Interventions ? Will write for PRN low dose labetalol in case her bp remains high. Writing very low dose because she was recently on pressors. Dose may need modification during the day. D/w RN   ? ? ? ?Intervention Category ?Intermediate Interventions: Hypertension - evaluation and management ? ?Trena Dunavan G Ashiya Kinkead ?10/31/2021, 3:49 AM ? ?Addendum at 5:20 am ?K is 3.4, creatinine is 2.0 ?Given lasix yesterday ?Writing for 20 meq IV kcl and repeat K at 10 am  ?

## 2021-10-31 NOTE — Progress Notes (Addendum)
? ?NAME:  JIM PHILEMON, MRN:  [id number], DOB:  02/16/1961, LOS: 9 ?ADMISSION DATE:  10/22/2021, CONSULTATION DATE:  10/22/21 ?REFERRING MD:  Francia Greaves, CHIEF COMPLAINT:  abdominal pain  ? ?History of Present Illness:  ?[name] with history of alcohol use disorder with prior severe withdrawal/DT requiring ICU admission and precedex, smoking who presented to the ED today for severe diffuse abdominal pain that began the night before admission. She had had constipation for 4d PTA. Endorsed frequent alcohol use. Last drink was day PTA.  ? ?In ED she was found to have pneumoperitoneum and free fluid on CT A/P, severe lactic acidosis. She was started on broad spectrum antibiotics and has been given a total of 5L of crystalloid. Surgery was consulted and she was taken to OR where she underwent ex lap and was found to have perforated sigmoid colon, then had hartman's procedure and insertion of g-tube which was left to gravity drainage, JP bulb in pelvis. She was noted to have feculent peritonitis in the pelvis.  ? ?Pertinent  Medical History  ?Etoh use disorder ?History of DT ?Smoking ? ?Significant Hospital Events: ?Including procedures, antibiotic start and stop dates in addition to other pertinent events   ?10/22/21 ex lap, hartman's procedure, g tube placement. Extubated in OR ?4/17 Reintubated overnight with continued pressor support x2. 11 liters positive thus far with progressive AKI this am  ?4/18 Continued issues with electrolyte abnormalities overnight, remains on levo and neo. She is now 14L positive but urine output has increased  ?4/19 No issues overnight, remains 14L positive but urine output continues to improve. Start to slowly diureses today  ?4/20 weaning NE, extubated, diuresed, started trickle TF, remains on precedex for agitation  ?4/21 periods of agitation/ hallucinations, scheduled ativan helps, remains on precedex 0.7. CT A/P with suspicious for partial small bowel obstruction, with transition point in the  lower central pelvis. Increased small to moderate amount of ascites, without definite focal abscess collection. Small amount of postop free air also noted. New small left pleural effusion and left lower lobe atelectasis. New diffuse body wall edema, consistent with anasarca. Cholelithiasis.  ?4/22 On  TPN.  Did not tolerated TF yesterday -vomitted and now G tube to LIS with bilios returns.  Low grade fever +. WBC now normal. On Zosyn. Hgb < 7g% without overt bleeding - 1 U PRBC. delirium worse.  ? ?Interim History / Subjective:  ?Remains off vasopressors, overnight with agitation requiring precedex, 5 mg Haldol, 2 mg versed ? ?Objective   ?Blood pressure (!) 150/94, pulse (!) 119, temperature 98.1 ?F (36.7 ?C), temperature source Axillary, resp. rate (!) 24, height 5' 10.5" (1.791 m), weight 59 kg, SpO2 97 %. ?CVP:  [1 mmHg-12 mmHg] 1 mmHg  ?   ? ?Intake/Output Summary (Last 24 hours) at 10/31/2021 0735 ?Last data filed at 10/31/2021 2440 ?Gross per 24 hour  ?Intake 2219.69 ml  ?Output 4491 ml  ?Net -2271.31 ml  ? ?Filed Weights  ? 10/29/21 0500 10/30/21 0358 10/31/21 0500  ?Weight: 67.9 kg 59.8 kg 59 kg  ? ? ?Examination: ?General:  Ill appearing thin older female lying in bed  ?HEENT: Dry MM  ?Neuro:  Awakens with physical/verbal stimulation, follows simple commands, oriented to location (hospital, disoriented to city/state), oriented to self and situation, disoriented to time ?CV: Tachy, HR 118, no mRG, CVP 3 ?PULM:  clear breath sounds, no use of accessory muscles  ?GI: soft, hypoactive bowel sounds, midline vac in place, ostomy site pink, left JP, Gtube to  LWS ?Extremities: warm/dry, -edema  ? ? ?Resolved Hospital Problem list   ?[place] ?Lactic acidosis  ?Hyponatremia  ?Hypocalcemia ?Transaminitis ? ?Assessment & Plan:  ? ?Septic shock due to feculent peritonitis with sigmoid perforation  ?-s/p hartman's procedure, g-tube placement 4/15 ?-C/T AP 4/21 suspicious for partial small bowel obstruction, with transition  point in the lower central pelvis. Increased small to moderate amount of ascites, without definite focal abscess collection. Small amount of postop free air also noted. New small left pleural effusion and left lower lobe atelectasis. New diffuse body wall edema, consistent with anasarca. Cholelithiasis.  ?Klebsiella Oxytoca bacteremia  ?-Resistant to Ampicillin only  ?Cefoxitin 4/15 - 4/15 ?cefepime 4/15 - 4/17 ?Vanc 4/15 -  4/16 ?Zosyn 4/17 - 4/20 ?Ceftriaxone 4/17 - 4/17, 4/20 - 4/20 ?Flagyl 4/15 - 4/17,  4/20 - 4/20 ?Zosyn 4/20 - (4/27) ?P: ?Per Surgery  ?Continues 5 day course of Zosyn with stop date 4/27  ?Goal MAP > 65, remains off vasopressors, CVP 3, with 3.2L out 4/23, give 3 doses of albumin, hold further diuresis  ?Consider transition of CVL to PICC when AKI improved  ?TPN per surgery (started 4/19) ?Local wound care > WOC for colostomy, continues with wound vac to midline ?PT/ OT ?  ?Acute Hypoxic Respiratory Failure  ?-Secondary to above, re-intubated overnight 4/16, Extubated 4/20 ?P: ?Wean supplemental O2 for sat goal > 92% (on 2L [place])  ?Aggressive pulmonary hygiene- IS, mobilize as able  ? ?HTN ?Plan ?Cardiac Monitoring  ?PRN Labetalol, Hydralazine SBP >170  ? ?Acute Encephalopathy, multifactorial with ICU delirium, ongoing sepsis, ETOH withdrawal (should be out of window at this point)  ?Alcohol use and History of DT ?-Has needed precedex before in ICU. ?P: ?Continue Precedex drip for CIWA ?Versed PRN  ?Haoldol PRN with QTC monitoring (4/23 408)  ?Supplement IV thiamine, folate, MVI ? ?AKI ?Hypernatremia  ?Hypokalemia  ?P: ?Trend BMP  ?Strict I/Os ?Replace electrolytes as indicated ?Hold Further diuresis, Pharmacy to adjust TPN to stop additional sodium content   ?Avoid nephrotoxic agents, ensure adequate renal perfusion ? ?Thrombocytopenia  ?Likely 2/2 to septic shock.  No evidence of bleeding/ clotting. Has been on SCDs only for VTEx.   Monitor closely.  Zosyn may be contributing.  ?Normocytic  anemia ?P:  ?Trend CBC, Transfuse <7  ?SQ heparin started 4/23 for DVT ppx  ? ?Cachexia - Prior to & Present on Admit ?Protein Calorie Malnuritioin - (ablg 2,7 at admit) - moderate Prior to & Present on Admit ?P: ?monitor ? ?Right proximal humerus fracture, pta  ?-s/p fall in March 2023 ?-will need outpt ortho f/u.  Seen by ortho 09/26/21 with plans for non weightbearing of RUE x1 month and then f/u with ortho in one month.  ? ?Best Practice (right click and "Reselect all SmartList Selections" daily)  ? ?Diet/type: NPO TPN, trickle TF per surgery- do not advance  ?DVT prophylaxis: prophylactic heparin  ?GI prophylaxis: PPI ?Lines: Central line 4/16 .Marland Kitchen on TPN, awaiting ability to place PICC once Crt improves. ?Foley:  N/A ?Code Status:  full code ?Last date of multidisciplinary goals of care discussion: Continue to update family daily. No family at bedside.  ? ?Critical care time: 35 minutes  ? ?Hayden Pedro, AGACNP-BC ?[facility]  ?PCCM Pgr: [phone] ? ? ? ? ?  ?

## 2021-10-31 NOTE — Progress Notes (Signed)
Notified e-link of the following: CVP 12, left-sided rhonchi, increasing BP, HR in the 120's, abdominal discomfort with moderate but continuous leaking of g-tube (dressing changed), and dry oral mucosa even with q2hr oral care. Little for pain management as most pain management medications have been administered within the last 4 hours.  ?

## 2021-10-31 NOTE — Progress Notes (Signed)
? ? ?Assessment & Plan: ?POD#9 - PERFORATED SIGMOID COLON - status post EX LAP, HARTMAN'S PROCEDURE, Gastrostomy tube placement - 6/76/7209 Dr. Leighton Ruff ? OR FINDINGS: Purulent ascites throughout the abdomen and stool contamination in the pelvis due to necrotic perforated sigmoid colon ?- Status post drainage of left lower quadrant fluid collection - 4/21 ?- Weaned off pressors ?- Await resolution of ileus - continue gastric tube to low minimum wall suction ?- Continue IV antibiotics for feculent peritonitis - continue 5 days past drain placement  ?- Delirium most likely in the setting of alcohol and substance withdrawal - ICU care per primary service ?- Wound VAC for midline incision - WOC consult following ?- Colostomy care - WOC consult following ?- TPN parenteral nutrition - per pharmacy consult ?- Gastrostomy tube care ? ?Most likely would benefit from EGD & lower endoscopy when she gets through this severe phase to rule out any abnormalities in any esophageal stricture from above.  Make sure there are no other pathologies before considering colostomy takedown.  Usually wait about 6-8 weeks from discharge.  ?  ?FEN - g-tube to suction, TPN for severe protein calorie malnutrition, fluids per primary ?VTE - on hold due to thrombocytopenia ?ID - zosyn 4/17>>.  Most likely plan stopping 5 days after drain after drain placement = 4/27 ?MMP - per primary service ?PCM - TPN ? ?      Armandina Gemma, MD ?[facility] Surgery ?A [facility] practice ?Office: [phone] ?       ?Chief Complaint: ?Perforated diverticular disease, esophageal stricture ? ?Subjective: ?Patient in ICU, responsive, wants to drink ? ?Objective: ?Vital signs in last 24 hours: ?Temp:  [98.1 ?F (36.7 ?C)-100.3 ?F (37.9 ?C)] 100.3 ?F (37.9 ?C) (04/24 2947) ?Pulse Rate:  [105-133] 117 (04/24 0800) ?Resp:  [16-25] 19 (04/24 0800) ?BP: (120-185)/(56-145) 150/80 (04/24 0800) ?SpO2:  [97 %-100 %] 99 % (04/24 0800) ?Weight:  [59 kg] 59 kg (04/24  0500) ?Last BM Date : 10/25/21 ? ?Intake/Output from previous day: ?04/23 0701 - 04/24 0700 ?In: 1271.6 [I.V.:1131.5; IV Piggyback:100.1] ?Out: 6546 [Urine:3231; Drains:1170; Stool:90] ?Intake/Output this shift: ?Total I/O ?In: 948.1 [I.V.:798.1; IV Piggyback:150] ?Out: -  ? ?Physical Exam: ?HEENT - sclerae clear, mucous membranes moist ?Neck - soft ?Abdomen - soft, mild distension; ostomy viable with small fecal output; LLQ drain with purulent output; gastrostomy on LIS with bilious output; midline VAC intact ?Ext - no edema, non-tender ? ?Lab Results:  ?Recent Labs  ?  10/30/21 ?5035 10/31/21 ?0330  ?WBC 8.1 8.6  ?HGB 8.8* 8.7*  ?HCT 27.0* 26.3*  ?PLT 92* 127*  ? ?BMET ?Recent Labs  ?  10/30/21 ?4656 10/31/21 ?0330  ?NA 146* 149*  ?K 3.8 3.4*  ?CL 108 112*  ?CO2 28 28  ?GLUCOSE 180* 132*  ?BUN 66* 75*  ?CREATININE 2.02* 2.02*  ?CALCIUM 7.9* 7.9*  ? ?PT/INR ?Recent Labs  ?  10/29/21 ?0650 10/30/21 ?0337  ?LABPROT 14.9 14.6  ?INR 1.2 1.2  ? ?Comprehensive Metabolic Panel: ?   ?Component Value Date/Time  ? NA 149 (H) 10/31/2021 0330  ? NA 146 (H) 10/30/2021 8127  ? K 3.4 (L) 10/31/2021 0330  ? K 3.8 10/30/2021 0337  ? CL 112 (H) 10/31/2021 0330  ? CL 108 10/30/2021 0337  ? CO2 28 10/31/2021 0330  ? CO2 28 10/30/2021 0337  ? BUN 75 (H) 10/31/2021 0330  ? BUN 66 (H) 10/30/2021 5170  ? CREATININE 2.02 (H) 10/31/2021 0330  ? CREATININE 2.02 (H) 10/30/2021 0174  ?  GLUCOSE 132 (H) 10/31/2021 0330  ? GLUCOSE 180 (H) 10/30/2021 9629  ? CALCIUM 7.9 (L) 10/31/2021 0330  ? CALCIUM 7.9 (L) 10/30/2021 5284  ? AST 18 10/31/2021 0330  ? AST 20 10/30/2021 0337  ? ALT 15 10/31/2021 0330  ? ALT 18 10/30/2021 0337  ? ALKPHOS 37 (L) 10/31/2021 0330  ? ALKPHOS 34 (L) 10/30/2021 1324  ? BILITOT 2.0 (H) 10/31/2021 0330  ? BILITOT 1.7 (H) 10/30/2021 4010  ? PROT 5.2 (L) 10/31/2021 0330  ? PROT 4.9 (L) 10/30/2021 2725  ? ALBUMIN 1.7 (L) 10/31/2021 0330  ? ALBUMIN 1.8 (L) 10/30/2021 3664  ? ? ?Studies/Results: ?ECHOCARDIOGRAM  COMPLETE ? ?Result Date: 10/30/2021 ?   ECHOCARDIOGRAM REPORT   Patient Name:   Jessica [name] Date of Exam: 10/30/2021 Medical Rec #:  [id number]       Height:       70.5 in Accession #:    [id number]      Weight:       131.8 lb Date of Birth:  06-Jul-1961        BSA:          1.758 m? Patient Age:    61 years        BP:           137/73 mmHg Patient Gender: F               HR:           115 bpm. Exam Location:  Inpatient Procedure: 2D Echo Indications:    Cardiomyopathy  History:        Patient has no prior history of Echocardiogram examinations.  Sonographer:    Arlyss Gandy Referring Phys: 11 MURALI RAMASWAMY  Sonographer Comments: No apical window and no subcostal window. Limited images due to upper abdominal bandages. IMPRESSIONS  1. Limited examination.  2. Left ventricular ejection fraction, by estimation, is 55 to 60%. The left ventricle has normal function. Left ventricular endocardial border not optimally defined to evaluate regional wall motion. Left ventricular diastolic function could not be evaluated.  3. Right ventricular systolic function is normal. The right ventricular size is normal.  4. The mitral valve is grossly normal. Mild mitral valve regurgitation.  5. The aortic valve was not well visualized. Aortic valve regurgitation is not visualized. Aortic valve sclerosis is present, with no evidence of aortic valve stenosis.  6. Left pleural effusion evident. Comparison(s): No prior Echocardiogram. FINDINGS  Left Ventricle: Left ventricular ejection fraction, by estimation, is 55 to 60%. The left ventricle has normal function. Left ventricular endocardial border not optimally defined to evaluate regional wall motion. The left ventricular internal cavity size was normal in size. There is no left ventricular hypertrophy. Left ventricular diastolic function could not be evaluated. Right Ventricle: The right ventricular size is normal. No increase in right ventricular wall thickness. Right ventricular  systolic function is normal. Left Atrium: Left atrial size was normal in size. Right Atrium: Right atrial size was not assessed. Pericardium: There is no evidence of pericardial effusion. Mitral Valve: The mitral valve is grossly normal. There is mild calcification of the mitral valve leaflet(s). Mild mitral annular calcification. Mild mitral valve regurgitation. Tricuspid Valve: The tricuspid valve is grossly normal. Tricuspid valve regurgitation is trivial. Aortic Valve: The aortic valve was not well visualized. There is mild aortic valve annular calcification. Aortic valve regurgitation is not visualized. Aortic valve sclerosis is present, with no evidence of aortic valve stenosis. Pulmonic Valve: The pulmonic valve  was grossly normal. Pulmonic valve regurgitation is trivial. Aorta: The aortic root is normal in size and structure. IAS/Shunts: The interatrial septum was not assessed. Additional Comments: There is pleural effusion in the left lateral region.  LEFT VENTRICLE PLAX 2D LVIDd:         3.80 cm LVIDs:         2.70 cm LV PW:         0.70 cm LV IVS:        0.70 cm LVOT diam:     1.90 cm LVOT Area:     2.84 cm?  LEFT ATRIUM         Index LA diam:    3.50 cm 1.99 cm/m?                        PULMONIC VALVE AORTA                 PV Vmax:       1.06 m/s Ao Root diam: 2.60 cm PV Peak grad:  4.5 mmHg   SHUNTS Systemic Diam: 1.90 cm Rozann Lesches MD Electronically signed by Rozann Lesches MD Signature Date/Time: 10/30/2021/11:28:06 AM    Final   ? ?US Abdomen Limited RUQ (LIVER/GB) ? ?Result Date: 10/29/2021 ?CLINICAL DATA:  Abdominal pain EXAM: ULTRASOUND ABDOMEN LIMITED RIGHT UPPER QUADRANT COMPARISON:  CT examination dated October 28, 2021 FINDINGS: Gallbladder: 0.9 cm gallstone. No gallbladder wall thickening. Trace amount of pericholecystic fluid, nonspecific in the setting of ascites. No sonographic Murphy sign noted by sonographer. Common bile duct: Diameter: 2 mm Liver: No focal lesion identified. Within  normal limits in parenchymal echogenicity. Portal vein is patent on color Doppler imaging with normal direction of blood flow towards the liver. Other: None. IMPRESSION: 1. Cholelithiasis without definite evidence of acute

## 2021-10-31 NOTE — Progress Notes (Signed)
Physical Therapy Treatment ?Patient Details ?Name: Jessica [name] ?MRN: [id number] ?DOB: 18-Jun-1961 ?Today's Date: 10/31/2021 ? ? 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  Pt intubated post op and extubated 10/27/21.    Pt with hx of ETOH abuse and recent R shoulder fx (09/20/21) with X ray from 10/27/21 indicating fx still present. ? ?  ?PT Comments  ? ? Pt participates with PT today with encouragement and multi-modal cues for tasks. Oriented to self and place however difficult to fully assess cognition d/t severe dysarthria. Slightly incr pt effort with pt briefly able to maintain static sitting on EOB, participates in basic LE exercises however limited greatly by fatigue.  Returned to supine with pt reporting incr comfort. Continue to recommend SNF post acute  ?  ?Recommendations for follow up therapy are one component of a multi-disciplinary discharge planning process, led by the attending physician.  Recommendations may be updated based on patient status, additional functional criteria and insurance authorization. ? ?Follow Up Recommendations ? Skilled nursing-short term rehab (<3 hours/day) ?  ?  ?Assistance Recommended at Discharge Frequent or constant Supervision/Assistance  ?Patient can return home with the following Two people to help with walking and/or transfers;A lot of help with bathing/dressing/bathroom;Assistance with cooking/housework;Assist for transportation;Help with stairs or ramp for entrance ?  ?Equipment Recommendations ? Other (comment) (TBD)  ?  ?Recommendations for Other Services   ? ? ?  ?Precautions / Restrictions Precautions ?Precautions: Fall ?Precaution Comments: multiple lines--> L JP drain, gastrostomy tube, triple lumen central line, wound vac ?Restrictions ?Weight Bearing Restrictions: Yes ?RUE Weight Bearing: Non weight bearing ?Other Position/Activity Restrictions: recent R UE/shoulder fx - assume  NWB  ?  ? ?Mobility ? Bed Mobility ?Overal bed mobility: Needs Assistance ?Bed Mobility: Supine to Sit, Sit to Supine ?  ?  ?Supine to sit: Max assist, +2 for physical assistance, +2 for safety/equipment ?Sit to supine: Max assist, +2 for physical assistance, +2 for safety/equipment ?  ?General bed mobility comments: Pt following multi-modal cues inconsistently however is not resistant to movement. requiring assist to manage LEs and to control trunk with use of pad to complete rotation to/from EOB sitting ?  ? ?Transfers ?  ?  ?  ?  ?  ?  ?  ?  ?  ?  ?  ? ?Ambulation/Gait ?  ?  ?  ?  ?  ?  ?  ?  ? ? ?Stairs ?  ?  ?  ?  ?  ? ? ?Wheelchair Mobility ?  ? ?Modified Rankin (Stroke Patients Only) ?  ? ? ?  ?Balance Overall balance assessment: Needs assistance ?Sitting-balance support: Single extremity supported, Feet supported ?Sitting balance-Leahy Scale: Poor ?Sitting balance - Comments: Sat  EOB x 8 minutes briefly able to maintain static sit with very close supervision. posterior LOB and leaning L with incr fatigue. requiring min assist or greater to maintain balance/+2 for safety and line management ?Postural control: Left lateral lean, Posterior lean ?  ?  ?  ?  ?  ?  ?  ?  ?  ?  ?  ?  ?  ?  ?  ? ?  ?Cognition Arousal/Alertness: Awake/alert ?Behavior During Therapy: Flat affect ?Overall Cognitive Status: No family/caregiver present to determine baseline cognitive functioning ?Area of Impairment: Orientation, Attention, Following commands, Problem solving, Awareness, Safety/judgement ?  ?  ?  ?  ?  ?  ?  ?  ?  Orientation Level: Disoriented to, Situation, Time ?Current Attention Level: Focused ?  ?Following Commands: Follows one step commands inconsistently, Follows multi-step commands inconsistently ?  ?  ?Problem Solving: Decreased initiation, Difficulty sequencing, Requires verbal cues, Slow processing, Requires tactile cues ?General Comments: participates with multi-modal cues, pt speaks almost constatntly however  is extremely dysarthric with low volume ?  ?  ? ?  ?Exercises General Exercises - Lower Extremity ?Long Arc Quad: AROM, AAROM, Strengthening, Both, 5 reps, Seated ? ?  ?General Comments   ?  ?  ? ?Pertinent Vitals/Pain Pain Assessment ?Pain Assessment: Faces ?Faces Pain Scale: Hurts little more ?Pain Location: "all over" ?Pain Descriptors / Indicators: Discomfort, Grimacing ?Pain Intervention(s): Limited activity within patient's tolerance, Monitored during session, Repositioned  ? ? ?Home Living   ?  ?  ?  ?  ?  ?  ?  ?  ?  ?   ?  ?Prior Function    ?  ?  ?   ? ?PT Goals (current goals can now be found in the care plan section) Acute Rehab PT Goals ?Patient Stated Goal: Lie back down ?PT Goal Formulation: With patient ?Time For Goal Achievement: 11/11/21 ?Potential to Achieve Goals: Fair ?Progress towards PT goals: Progressing toward goals ? ?  ?Frequency ? ? ? Min 2X/week ? ? ? ?  ?PT Plan Current plan remains appropriate;Frequency needs to be updated  ? ? ?Co-evaluation   ?  ?  ?  ?  ? ?  ?AM-PAC PT "6 Clicks" Mobility   ?Outcome Measure ? Help needed turning from your back to your side while in a flat bed without using bedrails?: Total ?Help needed moving from lying on your back to sitting on the side of a flat bed without using bedrails?: Total ?Help needed moving to and from a bed to a chair (including a wheelchair)?: Total ?Help needed standing up from a chair using your arms (e.g., wheelchair or bedside chair)?: Total ?Help needed to walk in hospital room?: Total ?Help needed climbing 3-5 steps with a railing? : Total ?6 Click Score: 6 ? ?  ?End of Session Equipment Utilized During Treatment: Oxygen ?Activity Tolerance: Patient limited by fatigue;Other (comment) (cognition) ?Patient left: in bed;with call bell/phone within reach;with bed alarm set;Other (comment) (mittens left in place) ?  ?PT Visit Diagnosis: Muscle weakness (generalized) (M62.81);History of falling (Z91.81);Difficulty in walking, not  elsewhere classified (R26.2);Pain ?  ? ? ?Time: 1937-9024 ?PT Time Calculation (min) (ACUTE ONLY): 32 min ? ?Charges:  $Therapeutic Activity: 23-37 mins          ?          ? Baxter Flattery, PT ? ?Acute Rehab Dept [facility]) [phone] ?Pager [phone] ? ?10/31/2021 ? ? ? ?Lebert Lovern ?10/31/2021, 10:28 AM ? ?

## 2021-11-01 ENCOUNTER — Inpatient Hospital Stay (HOSPITAL_COMMUNITY): Payer: Self-pay

## 2021-11-01 LAB — CBC
HCT: 25.2 % — ABNORMAL LOW (ref 36.0–46.0)
Hemoglobin: 8.2 g/dL — ABNORMAL LOW (ref 12.0–15.0)
MCH: 29 pg (ref 26.0–34.0)
MCHC: 32.5 g/dL (ref 30.0–36.0)
MCV: 89 fL (ref 80.0–100.0)
Platelets: 132 10*3/uL — ABNORMAL LOW (ref 150–400)
RBC: 2.83 MIL/uL — ABNORMAL LOW (ref 3.87–5.11)
RDW: 15.6 % — ABNORMAL HIGH (ref 11.5–15.5)
WBC: 6.7 10*3/uL (ref 4.0–10.5)
nRBC: 0 % (ref 0.0–0.2)

## 2021-11-01 LAB — BASIC METABOLIC PANEL
Anion gap: 8 (ref 5–15)
Anion gap: 7 (ref 5–15)
BUN: 84 mg/dL — ABNORMAL HIGH (ref 6–20)
BUN: 82 mg/dL — ABNORMAL HIGH (ref 6–20)
CO2: 26 mmol/L (ref 22–32)
CO2: 24 mmol/L (ref 22–32)
Calcium: 8.5 mg/dL — ABNORMAL LOW (ref 8.9–10.3)
Calcium: 8.2 mg/dL — ABNORMAL LOW (ref 8.9–10.3)
Chloride: 114 mmol/L — ABNORMAL HIGH (ref 98–111)
Chloride: 114 mmol/L — ABNORMAL HIGH (ref 98–111)
Creatinine, Ser: 1.93 mg/dL — ABNORMAL HIGH (ref 0.44–1.00)
Creatinine, Ser: 2.09 mg/dL — ABNORMAL HIGH (ref 0.44–1.00)
GFR, Estimated: 29 mL/min — ABNORMAL LOW (ref >60)
GFR, Estimated: 27 mL/min — ABNORMAL LOW (ref >60)
Glucose, Bld: 134 mg/dL — ABNORMAL HIGH (ref 70–99)
Glucose, Bld: 124 mg/dL — ABNORMAL HIGH (ref 70–99)
Potassium: 3.3 mmol/L — ABNORMAL LOW (ref 3.5–5.1)
Potassium: 4.3 mmol/L (ref 3.5–5.1)
Sodium: 148 mmol/L — ABNORMAL HIGH (ref 135–145)
Sodium: 145 mmol/L (ref 135–145)

## 2021-11-01 LAB — PHOSPHORUS: Phosphorus: 3.9 mg/dL (ref 2.5–4.6)

## 2021-11-01 LAB — GLUCOSE, CAPILLARY
Glucose-Capillary: 120 mg/dL — ABNORMAL HIGH (ref 70–99)
Glucose-Capillary: 123 mg/dL — ABNORMAL HIGH (ref 70–99)
Glucose-Capillary: 135 mg/dL — ABNORMAL HIGH (ref 70–99)
Glucose-Capillary: 142 mg/dL — ABNORMAL HIGH (ref 70–99)

## 2021-11-01 LAB — MAGNESIUM: Magnesium: 2 mg/dL (ref 1.7–2.4)

## 2021-11-01 LAB — BRAIN NATRIURETIC PEPTIDE: B Natriuretic Peptide: 1640.2 pg/mL — ABNORMAL HIGH (ref 0.0–100.0)

## 2021-11-01 MED ORDER — POTASSIUM CHLORIDE 20 MEQ PO PACK
20.0000 meq | PACK | ORAL | Status: DC
  Administered 2021-11-01: 20 meq
  Filled 2021-11-01: qty 1

## 2021-11-01 MED ORDER — TRAVASOL 10 % IV SOLN
INTRAVENOUS | Status: AC
  Filled 2021-11-01: qty 1113.6

## 2021-11-01 MED ORDER — DEXTROSE 5 % IV SOLN: INTRAVENOUS | Status: DC

## 2021-11-01 MED ORDER — POTASSIUM CHLORIDE 10 MEQ/50ML IV SOLN
10.0000 meq | INTRAVENOUS | Status: AC
  Administered 2021-11-01 (×4): 10 meq via INTRAVENOUS
  Filled 2021-11-01 (×4): qty 50

## 2021-11-01 MED ORDER — POTASSIUM CHLORIDE 10 MEQ/50ML IV SOLN
10.0000 meq | INTRAVENOUS | Status: AC
  Administered 2021-11-01 (×2): 10 meq via INTRAVENOUS
  Filled 2021-11-01 (×2): qty 50

## 2021-11-01 MED ORDER — DEXTROSE 5 % IV SOLN: INTRAVENOUS | Status: AC

## 2021-11-01 MED ORDER — IPRATROPIUM-ALBUTEROL 0.5-2.5 (3) MG/3ML IN SOLN
3.0000 mL | Freq: Four times a day (QID) | RESPIRATORY_TRACT | Status: DC
  Administered 2021-11-01 – 2021-11-06 (×22): 3 mL via RESPIRATORY_TRACT
  Filled 2021-11-01 (×23): qty 3

## 2021-11-01 NOTE — Evaluation (Signed)
SLP Cancellation Note ? ?Patient Details ?Name: Jessica [name] ?MRN: [id number] ?DOB: 01/04/1961 ? ? ?Cancelled treatment:       Reason Eval/Treat Not Completed: Other (comment) (pt declined to participate in speech eval; she is dysarthric but responds well to SLP verbal cues to slow rate and ovearticulate; noted dried yellowish coating on anterior tongue and hard/soft palate; asked RN to provide oral care; Hopefully pt's articulation will improve with decreased xerostomia and secretion clearance.  will continue efforts) ? ?Pt states she majored in journalism and speech in college and she normally talks at a rapid rate. Cued her to slow rate with pronounced lingual movement -- using teach back to improve intelligiblity.    ? ?Kathleen Lime, MS [facility] SLP ?Acute Rehab Services ?Office [phone] ?Pager [phone] ? ? ?Jessica [name] ?11/01/2021, 3:45 PM ? ? ? ?

## 2021-11-01 NOTE — Progress Notes (Signed)
PHARMACY - TOTAL PARENTERAL NUTRITION CONSULT NOTE  ? ?Indication: Prolonged ileus ? ?Patient Measurements: ?Height: 5' 10.5" (179.1 cm) ?Weight: 58.8 kg (129 lb 10.1 oz) ?IBW/kg (Calculated) : 69.65 ?TPN AdjBW (KG): 53.8 ?Body mass index is 18.34 kg/m?. ?Usual Weight: 53.8 kg ? ?Assessment: 61 yo F w/ PMH etoh use disorder, smoker, HTN, GERD ?s/p Exp Lap Hartmans 10/22/2021 for perforated sigmoid colon, Dr. Marcello Moores  ? ?Glucose / Insulin: no hx DM.  CBGs <180.  Minimal SSI use - 1 units/24 h ?- 4/24 D5W 534m bolus, 4/25 D5W '@50'$  ml/hr x 12 hours for HyperNa ?Electrolytes: Na 148 (removed Na yesterday), K 3.3 (goal K >=4), Mag 2 (goal >=2), CorrCa 9.7, Phos WNL, Cl elevated ?Renal: SCr 1.9, BUN 84 rising ?Hepatic: LFTs improved to WNL; T bili up 2.2 ?Intake / Output;  UOP 2800 ml/24h (lasix 4/19, 4/20, 4/23), drains up 1520 ml/24h, stool 60 mL/24 hr ?Net I/O -0.5 L/24hr ?MIVF:  D5W '@50'$  ml/hr x 12 hours on 4/25 ?GI Imaging:  ?4/15 CTA: bowel perforation ?4/17 KUB ileus or pSBO ?4/20 CTA: severe ileus or pSBO ? ?GI Surgeries / Procedures:  ?10/22/21 s/p EXPLORATORY LAPAROTOMY hartmans for perforated sigmoid colon, Dr. TMarcello Moores ?4/23 IR placed drain LLQ ?Central access: CVC TL placed 10/23/21 ?TPN start date: 10/26/21 ? ?Nutritional Goals: ?Goal TPN rate is 80 mL/hr (provides ~ 111g of protein and ~ 2001 kcals per day)  ? ?RD Assessment: ?Estimated Needs ?Total Energy Estimated Needs: 1900-2150 kcal ?Total Protein Estimated Needs: 100-120 grams ?Total Fluid Estimated Needs: >/= 2 L/day ? ?Current Nutrition:  ?TPN @ 80 ml/hr  ?NPO  ? ?Plan:  ?Now: KCl 181m IV x6 runs total ?Continue TPN @  80 mL/hr to provide 100% of goals ?Electrolytes in TPN: Na 0 mEq/L, K 65 mEq/L, Ca 75m7mL, Mg 8mE67m, and Phos 8mmo44m. Cl:Ac max Ac ?Continue standard MVI and trace elements to TPN ?Continue folic acid & thiamine in TPN ?Continue Sensitive q8h SSI and adjust as needed ?Monitor TPN labs on Mon/Thurs ? ?ChrisGretta ArabmD, BCPS ?Clinical  Pharmacist ?[facility] [phone]/[phone] 8:29 AM ? ? ?

## 2021-11-01 NOTE — Progress Notes (Signed)
Nutrition Follow-up ? ?DOCUMENTATION CODES:  ? ?Non-severe (moderate) malnutrition in context of chronic illness, Underweight ? ?INTERVENTION:  ?- continue TPN per Pharmacist order. ?- order vitamin A repletion once able to take PO or meds per G-tube. ?- repeat NFPE at follow-up d/t weight changes throughout hospitalization.  ? ? ?NUTRITION DIAGNOSIS:  ? ?Moderate Malnutrition related to chronic illness (alcohol abuse) as evidenced by moderate fat depletion, moderate muscle depletion, severe muscle depletion. -ongoing ? ?GOAL:  ? ?Patient will meet greater than or equal to 90% of their needs -met with TPN regimen ? ?MONITOR:  ? ?Labs, Weight trends, Skin, I & O's, Other (Comment) (TPN regimen) ? ?ASSESSMENT:  ? ?61 year old female with medical history of tobacco abuse and alcohol use disorder with prior severe withdrawal/DT requiring ICU admission and precede. She presented to the ED due to severe, diffuse abdominal pain that began the night prior and constipation x4 days. She reported frequent alcohol ingestion and last drink was the day PTA. In ED she was found to have pneumoperitoneum and free fluid on CT abdomen/pelvis and severe lactic acidosis. She was started on broad spectrum antibiotics and has been given a total of 5L of crystalloid. Surgery was consulted and she was taken to OR where she underwent ex lap and was found to have perforated sigmoid colon, then had hartman's procedure and insertion of g-tube which was left to gravity drainage, JP bulb in pelvis. She was noted to have feculent peritonitis in the pelvis. ? ?  ?Significant Events:  ?4/15- admission; ex lap with Hartman's procedure and insertion of feeding G-tube d/t feculent peritonitis and sigmoid perforation ?4/17- re-intubated; OGT insertion; initial RD assessment ?4/18- OGT dislodgement ?4/19- TPN initiation ?4/20- initiation of TF at 20 ml/hr; extubation ?4/21- large volume emesis; G-tube clamped and then placed to LIWS; drainage of LLQ  fluid collectomy ?4/22- checked vitamins A, B12, C, and D with 0500 labs ? ? ?Patient discussed in rounds this AM. Discussed with RN one-on-one after rounds. She confirms 300 ml dark green output is from G-tube this shift and shares that output during day shift yesterday was 600-650 ml and during night shift was 700 ml/hr. ? ?Patient with triple lumen CVC in R IJ; ongoing discussions about PICC placement. She is receiving TPN at goal rate of 80 ml/hr. This regimen is providing 2001 kcal and ~111 grams protein/day. ? ?Increased estimated protein and fluid needs at this time d/t persistent high output from G-tube, output from wound vac, and UOP yesterday was 2800 ml. ? ?Weight up from 5/19-5/20 and has been trending back down since that time. No diuretics currently ordered.  ? ?Per notes: ?- pending resolution of ileus ?- delirium d/t alcohol and substance withdrawal ?- wound vac to midline incision ?- colostomy ?- G-tube placed intraoperatively due to concern for distal esophageal stricture that prohibited passing a NGT ? ? ?Labs reviewed; CBGs: 135 and 142 mg/dl, Na: 148 mmol/l, K: 3.3 mmol/l, Cl: 114 mmol/l, BUN: 84 mg/dl, creatinine: 1.93 mg/dl, Ca: 8.5 mg/dl, GFR: 29 ml/min. ?Micronutrient panel: vitamin A: 14.2 (ref range: 22-69.5), vitamin B12: 7287 (ref range: 180-914), vitamin C: in process, vitamin D: 51.73 (ref range: 30-100). ?Medications reviewed; sliding scale novolog, 40 mg IV protonix BID, 10 mEq IV KCl x2 runs 4/24 and x2 runs 4/25. ?IVF; D5 @ 50 ml/hr (204 kcal/24 hrs). ? ? ?Diet Order:   ?Diet Order   ? ?       ?  Diet NPO time specified Except for:  Ice Chips  Diet effective now       ?  ? ?  ?  ? ?  ? ? ?EDUCATION NEEDS:  ? ?Not appropriate for education at this time ? ?Skin:  Skin Assessment: Skin Integrity Issues: ?Skin Integrity Issues:: DTI, Incisions ?DTI: vertebral column (newly documented on 4/20) ?Incisions: abdomen (4/15) ? ?Last BM:  4/25 (60 ml via colostomy) ? ?Height:  ? ?Ht Readings  from Last 1 Encounters:  ?10/24/21 5' 10.5" (1.791 m)  ? ? ?Weight:  ? ?Wt Readings from Last 1 Encounters:  ?11/01/21 58.8 kg  ? ? ? ?BMI:  Body mass index is 18.34 kg/m?. ? ?Estimated Nutritional Needs:  ?Kcal:  1900-2150 kcal ?Protein:  110-125 grams ?Fluid:  >/= 2.5 L/day ? ? ? ? ? ?Jarome Matin, MS, RD, LDN ?Registered Dietitian II ?Inpatient Clinical Nutrition ?RD pager # and on-call/weekend pager # available in [place]  ? ?

## 2021-11-01 NOTE — Progress Notes (Signed)
CPT on hold due to pt in chair.  

## 2021-11-01 NOTE — Progress Notes (Signed)
[facility] ADULT ICU REPLACEMENT PROTOCOL ? ? ?The patient does apply for the [facility] Adult ICU Electrolyte Replacment Protocol based on the criteria listed below:  ? ?1.Exclusion criteria: TCTS patients, ECMO patients, and Dialysis patients ?2. Is GFR >/= 30 ml/min? Yes.    ?Patient's GFR today is 29 ?3. Is SCr </= 2? Yes.   ?Patient's SCr is 1.93 mg/dL ?4. Did SCr increase >/= 0.5 in 24 hours? No. ?5.Pt's weight >40kg  Yes.   ?6. Abnormal electrolyte(s): K+ 3.3  ?7. Electrolytes replaced per protocol ?8.  Call MD STAT for K+ </= 2.5, Phos </= 1, or Mag </= 1 ?Physician:  n/a ? ?Darlys Gales 11/01/2021 6:14 AM ? ?

## 2021-11-01 NOTE — Progress Notes (Signed)
? ?NAME:  Jessica [name], MRN:  [id number], DOB:  1961-06-24, LOS: 10 ?ADMISSION DATE:  10/22/2021, CONSULTATION DATE:  10/22/21 ?REFERRING MD:  Francia Greaves, CHIEF COMPLAINT:  abdominal pain  ? ?History of Present Illness:  ?[name] with history of alcohol use disorder with prior severe withdrawal/DT requiring ICU admission and precedex, smoking who presented to the ED today for severe diffuse abdominal pain that began the night before admission. She had had constipation for 4d PTA. Endorsed frequent alcohol use. Last drink was day PTA.  ? ?In ED she was found to have pneumoperitoneum and free fluid on CT A/P, severe lactic acidosis. She was started on broad spectrum antibiotics and has been given a total of 5L of crystalloid. Surgery was consulted and she was taken to OR where she underwent ex lap and was found to have perforated sigmoid colon, then had hartman's procedure and insertion of g-tube which was left to gravity drainage, JP bulb in pelvis. She was noted to have feculent peritonitis in the pelvis.  ? ?Pertinent  Medical History  ?Etoh use disorder ?History of DT ?Smoking ? ?Significant Hospital Events: ?Including procedures, antibiotic start and stop dates in addition to other pertinent events   ?10/22/21 ex lap, hartman's procedure, g tube placement. Extubated in OR ?4/17 Reintubated overnight with continued pressor support x2. 11 liters positive thus far with progressive AKI this am  ?4/18 Continued issues with electrolyte abnormalities overnight, remains on levo and neo. She is now 14L positive but urine output has increased  ?4/19 No issues overnight, remains 14L positive but urine output continues to improve. Start to slowly diureses today  ?4/20 weaning NE, extubated, diuresed, started trickle TF, remains on precedex for agitation  ?4/21 periods of agitation/ hallucinations, scheduled ativan helps, remains on precedex 0.7. CT A/P with suspicious for partial small bowel obstruction, with transition point in the  lower central pelvis. Increased small to moderate amount of ascites, without definite focal abscess collection. Small amount of postop free air also noted. New small left pleural effusion and left lower lobe atelectasis. New diffuse body wall edema, consistent with anasarca. Cholelithiasis.  ?4/22 On  TPN.  Did not tolerated TF yesterday -vomitted and now G tube to LIS with bilios returns.  Low grade fever +. WBC now normal. On Zosyn. Hgb < 7g% without overt bleeding - 1 U PRBC. delirium worse.  ?4/24 Remains off vasopressors, overnight with agitation requiring precedex, 5 mg Haldol, 2 mg versed ? ?Interim History / Subjective:  ?Remains off Precedex, tachycardiac and hypertensive overnight suspect pain related, given 2 doses of 50 mcg fentanyl. PEG tube with leakage around site > surgery notified  ? ?Objective   ?Blood pressure (!) 154/69, pulse (!) 110, temperature 98 ?F (36.7 ?C), temperature source Axillary, resp. rate (!) 25, height 5' 10.5" (1.791 m), weight 58.8 kg, SpO2 99 %. ?CVP:  [2 mmHg-12 mmHg] 8 mmHg  ?   ? ?Intake/Output Summary (Last 24 hours) at 11/01/2021 0733 ?Last data filed at 11/01/2021 5732 ?Gross per 24 hour  ?Intake 3256.77 ml  ?Output 4380 ml  ?Net -1123.23 ml  ? ?Filed Weights  ? 10/30/21 0358 10/31/21 0500 11/01/21 0426  ?Weight: 59.8 kg 59 kg 58.8 kg  ? ? ?Examination: ?General:  Ill appearing thin older female lying in bed  ?HEENT: Dry MM  ?Neuro:  alert, follows simple commands, oriented to location (hospital, disoriented to city/state), oriented to self and situation, disoriented to time ?CV: Tachy, HR 111, no mRG ?PULM:  rhonchi, mild  accessory muscle use  ?GI: soft, hypoactive bowel sounds, midline vac in place, ostomy site pink, left JP, Gtube to LWS ?Extremities: warm/dry, -edema  ? ? ?Resolved Hospital Problem list   ?[place] ?Lactic acidosis  ?Hyponatremia  ?Hypocalcemia ?Transaminitis ? ?Assessment & Plan:  ? ?Septic shock due to feculent peritonitis with sigmoid perforation  ?-s/p  hartman's procedure, g-tube placement 4/15 ?-C/T AP 4/21 suspicious for partial small bowel obstruction, with transition point in the lower central pelvis. Increased small to moderate amount of ascites, without definite focal abscess collection. Small amount of postop free air also noted. New small left pleural effusion and left lower lobe atelectasis. New diffuse body wall edema, consistent with anasarca. Cholelithiasis.  ?Klebsiella Oxytoca bacteremia  ?-Resistant to Ampicillin only  ?Cefoxitin 4/15 - 4/15 ?cefepime 4/15 - 4/17 ?Vanc 4/15 -  4/16 ?Zosyn 4/17 - 4/20 ?Ceftriaxone 4/17 - 4/17, 4/20 - 4/20 ?Flagyl 4/15 - 4/17,  4/20 - 4/20 ?Zosyn 4/20 - (4/27) ?P: ?Per Surgery  ?Continues 5 day course of Zosyn with stop date 4/27 > Blood Cultures pending for 4/24 ?Goal MAP > 65, remains off vasopressors ?Consider transition of CVL to PICC when AKI improved  ?TPN per surgery (started 4/19) ?Local wound care > WOC for colostomy, continues with wound vac to midline ?G-Tube with leakage, patient with ABD pain >> KUB pending  ?PT/ OT/ ST (mumbled speech post-extubated)  ?  ?Acute Hypoxic Respiratory Failure, CXR with Left pleural effusion and atelectasis  ?-Secondary to above, re-intubated overnight 4/16, Extubated 4/20 ?P: ?Wean supplemental O2 for sat goal > 92% (on 2L [place])  ?Aggressive pulmonary hygiene- IS, mobilize as able  ?Add Chest PT  ?Add scheduled Duonebs  ? ?HTN ?Tachycardia  ?Plan ?Cardiac Monitoring  ?Started scheduled Metoprolol 4/24 > continue  ?PRN Labetalol, Hydralazine SBP >160  ? ?Acute Encephalopathy, multifactorial with ICU delirium, ongoing sepsis, ETOH withdrawal (should be out of window at this point)  ?Alcohol use and History of DT ?-Has needed precedex before in ICU. ?Acute Pain  ?P: ?Supplement IV thiamine, folate, MVI ?PRN Valium for agitation/anxiety  ?PRN Fentanyl  ? ?AKI ?Hypernatremia  ?Hypokalemia  ?P: ?Trend BMP  ?Strict I/Os ?Replace electrolytes as indicated ?D5 for 12 hours for  continued hypernatremia, received 500 ml bolus 4/24 ?Avoid nephrotoxic agents, ensure adequate renal perfusion ? ?Thrombocytopenia  ?Likely 2/2 to septic shock.  No evidence of bleeding/ clotting. Has been on SCDs only for VTEx.   Monitor closely.  Zosyn may be contributing.  ?Normocytic anemia ?P:  ?Trend CBC, Transfuse <7  ?SQ heparin started 4/23 for DVT ppx  ? ?Cachexia - Prior to & Present on Admit ?Protein Calorie Malnuritioin - (ablg 2,7 at admit) - moderate Prior to & Present on Admit ?P: ?monitor ? ?Right proximal humerus fracture, pta  ?-s/p fall in March 2023 ?-will need outpt ortho f/u.  Seen by ortho 09/26/21 with plans for non weightbearing of RUE x1 month and then f/u with ortho in one month.  ? ?Best Practice (right click and "Reselect all SmartList Selections" daily)  ? ?Diet/type: NPO TPN, trickle TF per surgery- do not advance  ?DVT prophylaxis: prophylactic heparin  ?GI prophylaxis: PPI ?Lines: Central line 4/16 .Marland Kitchen on TPN, awaiting ability to place PICC once Crt improves. ?Foley:  N/A ?Code Status:  full code ?Last date of multidisciplinary goals of care discussion: Continue to update family daily. No family at bedside.  ? ?Critical care time: 34 minutes  ? ?Hayden Pedro, AGACNP-BC ?[facility]  ?  PCCM Pgr: [phone] ? ? ? ? ?  ?

## 2021-11-01 NOTE — Progress Notes (Signed)
eLink Physician-Brief Progress Note ?Patient Name: Jessica [name] ?DOB: [birth year] ?MRN: [id number] ? ? ?Date of Service ? 11/01/2021  ?HPI/Events of Note ? Tongue does not appear significantly swollen, patient mildly SOB, saturation 100 %, will order a BNP to gauge volume status, CVP reportedly 8  ?eICU Interventions ? BNP check to gauge volume status.  ? ? ? ?  ? ?Jessica [name] ?11/01/2021, 4:52 AM ?

## 2021-11-01 NOTE — Progress Notes (Signed)
Notified e-link of patient's complaint of difficulty breathing. The patient was placed in the high fowlers position and NC4L was applied for the patient's comfort. Patient denied any pain at this time. Lung fields had audible rhonchi bilaterally and the patient was encouraged to deep breathe and cough. Provider came on camera to assess the patient further and will order further lab values. This nurse will continue with the current plan of care and monitor the patient for any changes.  ?

## 2021-11-01 NOTE — Progress Notes (Signed)
OT Cancellation Note ? ?Patient Details ?Name: Jessica [name] ?MRN: [id number] ?DOB: Jan 17, 1961 ? ? ?Cancelled Treatment:    Reason Eval/Treat Not Completed: Patient declined, no reason specified ?Patient declined to participate in session on this date. OT to continue to follow and check back on 4/26.  ?Toney Lizaola OTR/L, MS ?Acute Rehabilitation Department ?Office# [phone] ?Pager# [phone] ? ?11/01/2021, 12:39 PM ?

## 2021-11-01 NOTE — Progress Notes (Signed)
? ? ?Assessment & Plan: ?POD#10 - PERFORATED SIGMOID COLON - status post EX LAP, HARTMAN'S PROCEDURE, Gastrostomy tube placement - 01/18/1974 Dr. Leighton Ruff ? OR FINDINGS: Purulent ascites throughout the abdomen and stool contamination in the pelvis due to necrotic perforated sigmoid colon ?- Status post drainage of left lower quadrant fluid collection - 4/21 ?- Await resolution of ileus - continue gastric tube to low minimum wall suction ?- gastrostomy tube leaking - adjusted position and applied new dressing - observe ?- continue IV antibiotics for feculent peritonitis - continue 5 days past drain placement  ?- delirium in the setting of alcohol and substance withdrawal ?- wound VAC for midline incision - WOC consult following ?- colostomy care - WOC consult following ?- TPN - per pharmacy consult ?  ?FEN - g-tube to suction, TPN for severe protein calorie malnutrition ?VTE - on hold due to thrombocytopenia ?ID - zosyn 4/17>>.  Most likely plan stopping 5 days after drain after drain placement = 4/27 ? ?      Armandina Gemma, MD ?[facility] ?A [facility] practice ?Office: [phone] ?       ?Chief Complaint: ?Colonic perforation ? ?Subjective: ?Patient minimally responsive to voice ? ?Objective: ?Vital signs in last 24 hours: ?Temp:  [98 ?F (36.7 ?C)-100 ?F (37.8 ?C)] 98.1 ?F (36.7 ?C) (04/25 4158) ?Pulse Rate:  [99-133] 111 (04/25 0900) ?Resp:  [15-27] 26 (04/25 0900) ?BP: (138-176)/(60-111) 138/74 (04/25 0900) ?SpO2:  [96 %-100 %] 99 % (04/25 0900) ?Weight:  [58.8 kg] 58.8 kg (04/25 0426) ?Last BM Date : 10/25/21 ? ?Intake/Output from previous day: ?04/24 0701 - 04/25 0700 ?In: 3876.3 [I.V.:2546.4; IV Piggyback:1280] ?Out: 75 [Urine:2800; Drains:1520; Stool:60] ?Intake/Output this shift: ?Total I/O ?In: 526.7 [I.V.:313.9; IV Piggyback:212.8] ?Out: -  ? ?Physical Exam: ?HEENT - sclerae clear, mucous membranes moist ?Neck - soft ?Abdomen - soft, mild distension; bilious from G-tube to LIWS; drain  LLQ with yellow/golden with particulate; midline VAC intact; stoma viable, minimal output ?Ext - no edema, non-tender ? ? ?Lab Results:  ?Recent Labs  ?  10/31/21 ?1858 11/01/21 ?0236  ?WBC 7.1 6.7  ?HGB 8.1* 8.2*  ?HCT 24.7* 25.2*  ?PLT 124* 132*  ? ?BMET ?Recent Labs  ?  10/31/21 ?1858 11/01/21 ?0236  ?NA 148* 148*  ?K 3.6 3.3*  ?CL 114* 114*  ?CO2 25 26  ?GLUCOSE 179* 134*  ?BUN 76* 84*  ?CREATININE 2.01* 1.93*  ?CALCIUM 8.1* 8.5*  ? ?PT/INR ?Recent Labs  ?  10/30/21 ?0337  ?LABPROT 14.6  ?INR 1.2  ? ?Comprehensive Metabolic Panel: ?   ?Component Value Date/Time  ? NA 148 (H) 11/01/2021 0236  ? NA 148 (H) 10/31/2021 1858  ? K 3.3 (L) 11/01/2021 0236  ? K 3.6 10/31/2021 1858  ? CL 114 (H) 11/01/2021 0236  ? CL 114 (H) 10/31/2021 1858  ? CO2 26 11/01/2021 0236  ? CO2 25 10/31/2021 1858  ? BUN 84 (H) 11/01/2021 0236  ? BUN 76 (H) 10/31/2021 1858  ? CREATININE 1.93 (H) 11/01/2021 0236  ? CREATININE 2.01 (H) 10/31/2021 1858  ? GLUCOSE 134 (H) 11/01/2021 0236  ? GLUCOSE 179 (H) 10/31/2021 1858  ? CALCIUM 8.5 (L) 11/01/2021 0236  ? CALCIUM 8.1 (L) 10/31/2021 1858  ? AST 18 10/31/2021 1858  ? AST 18 10/31/2021 0330  ? ALT 12 10/31/2021 1858  ? ALT 15 10/31/2021 0330  ? ALKPHOS 35 (L) 10/31/2021 1858  ? ALKPHOS 37 (L) 10/31/2021 0330  ? BILITOT 2.2 (H) 10/31/2021 1858  ?  BILITOT 2.0 (H) 10/31/2021 0330  ? PROT 5.6 (L) 10/31/2021 1858  ? PROT 5.2 (L) 10/31/2021 0330  ? ALBUMIN 2.6 (L) 10/31/2021 1858  ? ALBUMIN 1.7 (L) 10/31/2021 0330  ? ? ?Studies/Results: ?DG CHEST PORT 1 VIEW ? ?Result Date: 11/01/2021 ?CLINICAL DATA:  Hypoxia EXAM: PORTABLE CHEST 1 VIEW COMPARISON:  Chest x-ray dated October 28, 2021 FINDINGS: Cardiac and mediastinal contours are within normal limits. Right arm PICC line with tip projecting over the expected area of the superior cavoatrial junction. Opacity of the lower left lung, likely combination of atelectasis and pleural effusion. No evidence of pneumothorax. IMPRESSION: Increased opacity of the  lower left lung, likely due to worsening pleural effusion and atelectasis. Electronically Signed   By: Yetta Glassman M.D.   On: 11/01/2021 08:11   ? ? ? ?Armandina Gemma ?11/01/2021 ? ? Patient ID: Jessica [name], female   DOB: 1961-03-08, 61 y.o.   MRN: [id number] ? ?
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
## 2021-11-02 ENCOUNTER — Inpatient Hospital Stay: Payer: Self-pay

## 2021-11-02 DIAGNOSIS — Z515 Encounter for palliative care: Secondary | ICD-10-CM

## 2021-11-02 DIAGNOSIS — E43 Unspecified severe protein-calorie malnutrition: Secondary | ICD-10-CM

## 2021-11-02 DIAGNOSIS — Z7189 Other specified counseling: Secondary | ICD-10-CM

## 2021-11-02 LAB — BASIC METABOLIC PANEL
Anion gap: 8 (ref 5–15)
BUN: 78 mg/dL — ABNORMAL HIGH (ref 6–20)
CO2: 22 mmol/L (ref 22–32)
Calcium: 8.1 mg/dL — ABNORMAL LOW (ref 8.9–10.3)
Chloride: 111 mmol/L (ref 98–111)
Creatinine, Ser: 1.88 mg/dL — ABNORMAL HIGH (ref 0.44–1.00)
GFR, Estimated: 30 mL/min — ABNORMAL LOW (ref >60)
Glucose, Bld: 279 mg/dL — ABNORMAL HIGH (ref 70–99)
Potassium: 3.5 mmol/L (ref 3.5–5.1)
Sodium: 141 mmol/L (ref 135–145)

## 2021-11-02 LAB — CBC
HCT: 25.7 % — ABNORMAL LOW (ref 36.0–46.0)
Hemoglobin: 8.2 g/dL — ABNORMAL LOW (ref 12.0–15.0)
MCH: 28.9 pg (ref 26.0–34.0)
MCHC: 31.9 g/dL (ref 30.0–36.0)
MCV: 90.5 fL (ref 80.0–100.0)
Platelets: 142 10*3/uL — ABNORMAL LOW (ref 150–400)
RBC: 2.84 MIL/uL — ABNORMAL LOW (ref 3.87–5.11)
RDW: 15.6 % — ABNORMAL HIGH (ref 11.5–15.5)
WBC: 6.4 10*3/uL (ref 4.0–10.5)
nRBC: 0 % (ref 0.0–0.2)

## 2021-11-02 LAB — VITAMIN B1: Vitamin B1 (Thiamine): 251.4 nmol/L — ABNORMAL HIGH (ref 66.5–200.0)

## 2021-11-02 LAB — GLUCOSE, CAPILLARY
Glucose-Capillary: 129 mg/dL — ABNORMAL HIGH (ref 70–99)
Glucose-Capillary: 128 mg/dL — ABNORMAL HIGH (ref 70–99)
Glucose-Capillary: 137 mg/dL — ABNORMAL HIGH (ref 70–99)

## 2021-11-02 LAB — PHOSPHORUS: Phosphorus: 3.3 mg/dL (ref 2.5–4.6)

## 2021-11-02 LAB — MAGNESIUM: Magnesium: 2 mg/dL (ref 1.7–2.4)

## 2021-11-02 MED ORDER — DEXTROSE 5 % IV SOLN: INTRAVENOUS | Status: AC

## 2021-11-02 MED ORDER — ALBUTEROL SULFATE (2.5 MG/3ML) 0.083% IN NEBU
INHALATION_SOLUTION | RESPIRATORY_TRACT | Status: AC
  Filled 2021-11-02: qty 3

## 2021-11-02 MED ORDER — POTASSIUM CHLORIDE 10 MEQ/50ML IV SOLN
10.0000 meq | INTRAVENOUS | Status: AC
  Administered 2021-11-02 (×4): 10 meq via INTRAVENOUS
  Filled 2021-11-02 (×4): qty 50

## 2021-11-02 MED ORDER — TRAVASOL 10 % IV SOLN
INTRAVENOUS | Status: AC
  Filled 2021-11-02: qty 1113.6

## 2021-11-02 MED ORDER — CHLORHEXIDINE GLUCONATE 0.12 % MT SOLN
15.0000 mL | Freq: Two times a day (BID) | OROMUCOSAL | Status: DC
  Administered 2021-11-02 – 2021-11-07 (×11): 15 mL via OROMUCOSAL
  Filled 2021-11-02 (×10): qty 15

## 2021-11-02 MED ORDER — BIOTENE DRY MOUTH MT LIQD: 15.0000 mL | OROMUCOSAL | Status: DC | PRN

## 2021-11-02 MED ORDER — ORAL CARE MOUTH RINSE
15.0000 mL | Freq: Two times a day (BID) | OROMUCOSAL | Status: DC
  Administered 2021-11-02 – 2021-11-07 (×11): 15 mL via OROMUCOSAL

## 2021-11-02 NOTE — Consult Note (Signed)
? ?                                                                                ?Consultation Note ?Date: 11/02/2021  ? ?Patient Name: Jessica [name]  ?DOB: 20-Jun-1961  MRN: [id number]  Age / Sex: 61 y.o., female  ?PCP: Deland Pretty, MD ?Referring Physician: Freddi Starr, MD ? ?Reason for Consultation: Establishing goals of care ? ?HPI/Patient Profile: 61 y.o. female   admitted on 10/22/2021   ? ?Clinical Assessment and Goals of Care: ?40 year old lady who lives at home with her boyfriend, admitted with abdominal pain.  Has a history of alcohol use.  Was found to have perforated sigmoid colon.  Underwent ex lap Hartman's procedure, gastrostomy tube placement on 10-22-2021.  Is on TPN for severe protein calorie malnutrition.  History of chronic right proximal humerus fracture as well.  Surgical colleagues following.  Patient to have clamping trials with NG. ?A palliative consultation has been requested for CODE STATUS and goals of care discussions. ?Patient is awake alert resting in bed.  She has some degree of slurred speech, speaks slowly and deliberately, however is able to answer all questions and is able to give her history and is able to understand and participate in the discussions. ?Palliative medicine is specialized medical care for people living with serious illness. It focuses on providing relief from the symptoms and stress of a serious illness. The goal is to improve quality of life for both the patient and the family. ?Goals of care: Broad aims of medical therapy in relation to the patient's values and preferences. Our aim is to provide medical care aimed at enabling patients to achieve the goals that matter most to them, given the circumstances of their particular medical situation and their constraints.  ?Patient states that she received some Valium and is slept some last night.  She states she is here in the hospital because her colon had exploded.  She states that she lives at home with her  boyfriend Rene Paci.  Discussed with her about her underlying conditions, her current hospitalization and the serious nature of her illness.  Goals wishes and values attempted to be explored. ? ?OTHER ? Patient states that she lives at home with her boyfriend Rene Paci [phone]  ? ?SUMMARY OF RECOMMENDATIONS   ?Full code/full scope mode of care for now.  Monitor hospital course and overall disease trajectory of illness.  Recommend skilled nursing facility rehabilitation attempt with palliative services following towards the end of this hospitalization. ? ?Code Status/Advance Care Planning: ?Full code ? ? ?Symptom Management:  ? ? ?Palliative Prophylaxis:  ?Delirium Protocol ? ?Additional Recommendations (Limitations, Scope, Preferences): ?Full Scope Treatment ? ?Psycho-social/Spiritual:  ?Desire for further Chaplaincy support:yes ?Additional Recommendations: Caregiving  Support/Resources ? ?Prognosis:  ?Unable to determine ? ?Discharge Planning: To Be Determined  ? ?  ? ?Primary Diagnoses: ?Present on Admission: ? Perforated sigmoid colon s/p Hartmann/colostomy 10/22/2021 ? Substance induced mood disorder ([place]) ? Anxiety ? Delirium tremens ([place]) ? Chronic pain ? Primary insomnia ? Tobacco user ? Underweight ? ? ?I have reviewed the medical record, interviewed the patient and family, and examined the patient. The following  aspects are pertinent. ? ?History reviewed. No pertinent past medical history. ?Social History  ? ?Socioeconomic History  ? Marital status: Single  ?  Spouse name: Not on file  ? Number of children: Not on file  ? Years of education: Not on file  ? Highest education level: Not on file  ?Occupational History  ? Not on file  ?Tobacco Use  ? Smoking status: Every Day  ?  Packs/day: 1.00  ?  Types: Cigarettes  ? Smokeless tobacco: Never  ?Substance and Sexual Activity  ? Alcohol use: Yes  ?  Comment: answer varies at present "1 bottle a night" or " 3-4 glasses of wine"  ? Drug use: Never  ?  Sexual activity: Not on file  ?Other Topics Concern  ? Not on file  ?Social History Narrative  ? Not on file  ? ?Social Determinants of Health  ? ?Financial Resource Strain: Not on file  ?Food Insecurity: Not on file  ?Transportation Needs: Not on file  ?Physical Activity: Not on file  ?Stress: Not on file  ?Social Connections: Not on file  ? ?History reviewed. No pertinent family history. ?Scheduled Meds: ? chlorhexidine  15 mL Mouth Rinse BID  ? Chlorhexidine Gluconate Cloth  6 each Topical Daily  ? heparin injection (subcutaneous)  5,000 Units Subcutaneous Q8H  ? insulin aspart  0-9 Units Subcutaneous Q8H  ? ipratropium-albuterol  3 mL Nebulization Q6H  ? mouth rinse  15 mL Mouth Rinse q12n4p  ? metoprolol tartrate  5 mg Intravenous Q6H  ? nicotine  14 mg Transdermal Daily  ? pantoprazole (PROTONIX) IV  40 mg Intravenous Q12H  ? sodium chloride flush  10-40 mL Intracatheter Q12H  ? sodium chloride flush  5 mL Intracatheter Q8H  ? ?Continuous Infusions: ? sodium chloride Stopped (10/25/21 1512)  ? dextrose 50 mL/hr at 11/02/21 0855  ? piperacillin-tazobactam (ZOSYN)  IV Stopped (11/02/21 1220)  ? TPN ADULT (ION) 80 mL/hr at 11/02/21 1300  ? TPN ADULT (ION)    ? ?PRN Meds:.acetaminophen, antiseptic oral rinse, diazepam, fentaNYL (SUBLIMAZE) injection, hydrALAZINE, labetalol, lip balm, ondansetron (ZOFRAN) IV, sodium chloride flush ?Medications Prior to Admission:  ?Prior to Admission medications   ?Medication Sig Start Date End Date Taking? Authorizing Provider  ?fluticasone (FLONASE) 50 MCG/ACT nasal spray Place 1-2 sprays into both nostrils daily as needed for allergies or rhinitis.   Yes [provider]  ?loratadine (CLARITIN) 10 MG tablet Take 10 mg by mouth daily as needed for allergies.   Yes [provider]  ?Multiple Vitamins-Minerals (MULTIVITAMIN ADULTS 50+) TABS Take 1 tablet by mouth every morning.   Yes [provider]  ?ondansetron (ZOFRAN) 8 MG tablet Take 8 mg by mouth  daily as needed for nausea/vomiting. 10/10/21  Yes [provider]  ?cetirizine (ZYRTEC) 10 MG tablet Take 10 mg by mouth daily as needed for allergies.    [provider]  ?diclofenac Sodium (VOLTAREN) 1 % GEL Apply 4 g topically 4 (four) times daily. ?Patient not taking: Reported on 10/22/2021 09/21/21   Deno Etienne, DO  ?mirtazapine (REMERON SOL-TAB) 15 MG disintegrating tablet Take 1 tablet (15 mg total) by mouth at bedtime. 12/12/17 01/11/18  Arrien, Jimmy Picket, MD  ?morphine (MSIR) 15 MG tablet Take 0.5 tablets (7.5 mg total) by mouth every 4 (four) hours as needed for severe pain. ?Patient not taking: Reported on 10/22/2021 09/26/21   Vanetta Mulders, MD  ? ?No Known Allergies ?Review of Systems ?Denies abdominal pain ?Physical Exam ?Awake, reasonably  alert ?Is able to answer questions appropriately ?Has some slurred speech ?Regular work of breathing ?Monitor noted ?Has a drain in left lower quadrant ?Abdomen pictures noted on surgical colleagues documentation ? ?Vital Signs: BP (!) 145/75 (BP Location: Left Arm)   Pulse (!) 118   Temp 99.6 ?F (37.6 ?C) (Axillary)   Resp (!) 24   Ht 5' 10.5" (1.791 m)   Wt 55.2 kg   SpO2 97%   BMI 17.21 kg/m?  ?Pain Scale: 0-10 ?POSS *See Group Information*: 1-Acceptable,Awake and alert ?Pain Score: 0-No pain ? ? ?SpO2: SpO2: 97 % ?O2 Device:SpO2: 97 % ?O2 Flow Rate: .O2 Flow Rate (L/min): 4 L/min ? ?IO: Intake/output summary:  ?Intake/Output Summary (Last 24 hours) at 11/02/2021 1401 ?Last data filed at 11/02/2021 1300 ?Gross per 24 hour  ?Intake 2288.11 ml  ?Output 2600 ml  ?Net -311.89 ml  ? ? ?LBM: Last BM Date : 10/25/21 ?Baseline Weight: Weight: 54 kg ?Most recent weight: Weight: 55.2 kg     ?Palliative Assessment/Data: ? ? ?  Palliative performance scale 40%. ? ?Time In: 1300 ?Time Out: 1400 ?Time Total: 60 ?Greater than 50%  of this time was spent counseling and coordinating care related to the above assessment and plan. ? ?Signed by: ?Loistine Chance,  MD ?  ?Please contact Palliative Medicine Team phone at [phone] for questions and concerns.  ?For individual provider: See Amion ? ? ? ? ? ? ? ? ? ? ? ? ? ?

## 2021-11-02 NOTE — Evaluation (Signed)
Occupational Therapy Evaluation ?Patient Details ?Name: Jessica [name] ?MRN: [id number] ?DOB: [birth year] ?Today's Date: 11/02/2021 ? ? 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  Pt intubated post op and extubated 10/27/21.    Pt with hx of ETOH abuse and recent R shoulder fx (09/20/21) with X ray from 10/27/21 indicating fx still present.  ? ?Clinical Impression ?  ?Patient with garbled speech very difficult to understand at times. Per prior admission in 2019 patient independent and living at home with spouse. Currently patient presents with global weakness needing total A +2 for bed mobility. Initially patient min A in sitting however grimaces pointing towards colostomy and starts to lean posteriorly needing max A to maintain upright position. Patient's blood pressure increase to 174/116 in sitting with patient stating she feels "kind of" dizzy. At this time patient would be hoyer lift for out of bed to chair. Acute OT to follow to maximize patient safety and independence with self care in order to facilitate D/C to venue listed below. ?   ? ?Recommendations for follow up therapy are one component of a multi-disciplinary discharge planning process, led by the attending physician.  Recommendations may be updated based on patient status, additional functional criteria and insurance authorization.  ? ?Follow Up Recommendations ? Skilled nursing-short term rehab (<3 hours/day)  ?  ?Assistance Recommended at Discharge Frequent or constant Supervision/Assistance  ?Patient can return home with the following Two people to help with walking and/or transfers;A lot of help with bathing/dressing/bathroom;Assistance with cooking/housework;Direct supervision/assist for medications management;Direct supervision/assist for financial management;Assist for transportation;Help with stairs or ramp for entrance ? ?  ?Functional Status Assessment ? Patient  has had a recent decline in their functional status and demonstrates the ability to make significant improvements in function in a reasonable and predictable amount of time.  ?Equipment Recommendations ? Other (comment) (to be determined)  ?  ?   ?Precautions / Restrictions Precautions ?Precautions: Fall ?Precaution Comments: multiple lines--> L JP drain, gastrostomy tube, triple lumen central line, wound vac ?Restrictions ?Weight Bearing Restrictions: Yes ?RUE Weight Bearing: Non weight bearing ?Other Position/Activity Restrictions: recent R UE/shoulder fx - assume NWB  ? ?  ? ?Mobility Bed Mobility ?Overal bed mobility: Needs Assistance ?Bed Mobility: Supine to Sit, Sit to Supine ?  ?  ?Supine to sit: Total assist, +2 for physical assistance, +2 for safety/equipment ?Sit to supine: Total assist, +2 for physical assistance, +2 for safety/equipment ?  ?General bed mobility comments: Attempt to have patient log roll but needing extensive assistance, total A +2 with use of bed pad to transition supine <> sit ?  ? ?Transfers ?  ?  ?  ?  ?  ?  ?  ?  ?  ?General transfer comment: Deferred 2* limited sitting balance and high BP ?  ? ?  ?Balance Overall balance assessment: Needs assistance ?Sitting-balance support: Feet supported ?Sitting balance-Leahy Scale: Zero ?Sitting balance - Comments: Patient initial min A in sitting, posterior lean needing max A 2* pain in abdomen ?  ?  ?  ?  ?  ?  ?  ?  ?  ?  ?  ?  ?  ?  ?  ?   ? ?ADL either performed or assessed with clinical judgement  ? ?ADL Overall ADL's : Needs assistance/impaired ?Eating/Feeding: NPO ?  ?Grooming: Maximal assistance ?  ?Upper Body Bathing: Maximal assistance ?  ?Lower Body Bathing: Total assistance ?  ?  Upper Body Dressing : Maximal assistance ?  ?Lower Body Dressing: Total assistance ?  ?  ?Toilet Transfer Details (indicate cue type and reason): Deferred 2* poor sitting balance ?Toileting- Clothing Manipulation and Hygiene: Total assistance;Bed level ?  ?   ?  ?Functional mobility during ADLs: Total assistance;+2 for physical assistance;+2 for safety/equipment ?General ADL Comments: Patient needing extensive assistance for self care tasks due to global weakness, impaired balance, activity tolerance, pain  ? ? ? ? ?Pertinent Vitals/Pain Pain Assessment ?Pain Assessment: Faces ?Faces Pain Scale: Hurts even more ?Pain Location: Abdomen ?Pain Descriptors / Indicators: Discomfort, Grimacing ?Pain Intervention(s): Monitored during session  ? ? ? ?Hand Dominance Right ?  ?Extremity/Trunk Assessment Upper Extremity Assessment ?Upper Extremity Assessment: Generalized weakness (Limited L UE use 2* recent fracture) ?  ?Lower Extremity Assessment ?Lower Extremity Assessment: Defer to PT evaluation ?  ?Cervical / Trunk Assessment ?Cervical / Trunk Assessment: Normal ?  ?Communication Communication ?Communication: No difficulties ?  ?Cognition Arousal/Alertness: Awake/alert ?Behavior During Therapy: [facility] for tasks assessed/performed ?Overall Cognitive Status: Difficult to assess ?  ?  ?  ?  ?  ?  ?  ?  ?  ?  ?  ?  ?  ?  ?  ?  ?  ?  ?  ?General Comments  Patient BP increase to 174/116 in sitting with patient report feeling "kind of " dizzy ? ?  ?   ?   ? ? ?Home Living Family/patient expects to be discharged to:: Private residence ?Living Arrangements: Spouse/significant other ?  ?  ?  ?  ?  ?  ?  ?  ?  ?  ?  ?  ?  ?  ?  ?Additional Comments: Pt unable to provide information. Prior stay in 2019 pt was home with spouse PRN in one level house. ?  ? ?  ?Prior Functioning/Environment Prior Level of Function : Independent/Modified Independent ?  ?  ?  ?  ?  ?  ?  ?  ?  ? ?  ?  ?OT Problem List: Decreased strength;Impaired balance (sitting and/or standing);Decreased activity tolerance;Decreased safety awareness;Decreased cognition;Pain ?  ?   ?OT Treatment/Interventions: Self-care/ADL training;Therapeutic exercise;DME and/or AE instruction;Therapeutic activities;Cognitive  remediation/compensation;Patient/family education;Balance training  ?  ?OT Goals(Current goals can be found in the care plan section) Acute Rehab OT Goals ?Patient Stated Goal: "brush my teeth" ?OT Goal Formulation: With patient ?Time For Goal Achievement: 11/16/21 ?Potential to Achieve Goals: Good  ?OT Frequency: Min 2X/week ?  ? ?   ?AM-PAC OT "6 Clicks" Daily Activity     ?Outcome Measure Help from another person eating meals?: Total (NPO) ?Help from another person taking care of personal grooming?: A Lot ?Help from another person toileting, which includes using toliet, bedpan, or urinal?: Total ?Help from another person bathing (including washing, rinsing, drying)?: A Lot ?Help from another person to put on and taking off regular upper body clothing?: A Lot ?Help from another person to put on and taking off regular lower body clothing?: Total ?6 Click Score: 9 ?  ?End of Session Nurse Communication: Mobility status;Other (comment) (linens soiled) ? ?Activity Tolerance: Patient limited by fatigue;Treatment limited secondary to medical complications (Comment) (elevated BP) ?Patient left: in bed;with call bell/phone within reach;with nursing/sitter in room ? ?OT Visit Diagnosis: Other abnormalities of gait and mobility (R26.89);Muscle weakness (generalized) (M62.81);Other symptoms and signs involving cognitive function  ?              ?Time: 6378-5885 ?OT Time  Calculation (min): 16 min ?Charges:  OT General Charges ?$OT Visit: 1 Visit ?OT Evaluation ?$OT Eval Low Complexity: 1 Low ? ?Jessica [name] OT ?OT pager: [phone] ? ?Jessica [name] ?11/02/2021, 2:13 PM ?

## 2021-11-02 NOTE — Progress Notes (Signed)
PHARMACY - TOTAL PARENTERAL NUTRITION CONSULT NOTE  ? ?Indication: Prolonged ileus ? ?Patient Measurements: ?Height: 5' 10.5" (179.1 cm) ?Weight: 58.8 kg (129 lb 10.1 oz) ?IBW/kg (Calculated) : 69.65 ?TPN AdjBW (KG): 53.8 ?Body mass index is 18.34 kg/m?. ?Usual Weight: 53.8 kg ? ?Assessment: 61 yo F w/ PMH etoh use disorder, smoker, HTN, GERD ?s/p Exp Lap Hartmans 10/22/2021 for perforated sigmoid colon, Dr. Marcello Moores  ? ?Glucose / Insulin: no hx DM.  CBGs <150.  Minimal SSI use - 1 units/24 h ?- 4/24 D5W 556m bolus, 4/25 D5W '@50'$  ml/hr x 12 hours for HyperNa ?Electrolytes: Na 141 improved (removed Na 4/24), K 3.5 (goal K >=4), Mag 2 (goal >=2) ?Renal: SCr 1.88, BUN 78 down ?Hepatic: LFTs improved to WNL; T bili up 2.2 ?Intake / Output;  ?accuracy last 24hr ?MIVF:  D5W '@50'$  ml/hr x 12 hours on 4/25 ?GI Imaging:  ?4/15 CTA: bowel perforation ?4/17 KUB ileus or pSBO ?4/20 CTA: severe ileus or pSBO ? ?GI Surgeries / Procedures:  ?10/22/21 s/p EXPLORATORY LAPAROTOMY hartmans for perforated sigmoid colon, Dr. TMarcello Moores ?4/23 IR placed drain LLQ ?Central access: CVC TL placed 10/23/21 ?TPN start date: 10/26/21 ? ?Nutritional Goals: ?Goal TPN rate is 80 mL/hr (provides ~ 111g of protein and ~ 2001 kcals per day)  ? ?RD Assessment: ?Estimated Needs ?Total Energy Estimated Needs: 1900-2150 kcal ?Total Protein Estimated Needs: 110-125 grams ?Total Fluid Estimated Needs: >/= 3 L/day ? ?Current Nutrition:  ?TPN @ 80 ml/hr  ?NPO  ? ?Plan:  ?Now: KCl 152m IV x4 runs total ?Continue TPN @  80 mL/hr to provide 100% of goals ?Electrolytes in TPN: Na 0 mEq/L, K 65 mEq/L, Ca 68m17mL, Mg 8mE1m, and Phos 8mmo568m. Cl:Ac max Ac ?Continue standard MVI and trace elements to TPN ?Continue folic acid & thiamine in TPN ?Continue Sensitive q8h SSI and adjust as needed ?Monitor TPN labs on Mon/Thurs ? ? ?JustiAdrian SaranrmD, BCPS ?Secure Chat if ?s ?11/02/2021 8:22 AM ? ? ?

## 2021-11-02 NOTE — Evaluation (Signed)
Speech Language Pathology Evaluation ?Patient Details ?Name: Jessica [name] ?MRN: [id number] ?DOB: Jul 11, 1960 ?Today's Date: 11/02/2021 ?Time: 1225-1300 ?SLP Time Calculation (min) (ACUTE ONLY): 35 min ? ?Problem List:  ?Patient Active Problem List  ? Diagnosis Date Noted  ? Malnutrition of moderate degree 10/31/2021  ? Colostomy in place [facility]) 10/30/2021  ? Stricture and stenosis of esophagus 10/30/2021  ? Gastrostomy tube in place [facility]) 10/30/2021  ? AKI (acute kidney injury) ([place]) 10/25/2021  ? Anxiety 10/24/2021  ? Chronic pain 10/24/2021  ? Allergic rhinitis 10/24/2021  ? 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 Tobacco user 10/24/2021  ? Underweight 10/24/2021  ? Acquired hammer toe of right foot 10/24/2021  ? Essential hypertension 10/24/2021  ? Urticaria 10/24/2021  ? Dry mouth 10/24/2021  ? Protein-calorie malnutrition, severe ([place]) 10/24/2021  ? Perforated sigmoid colon s/p Hartmann/colostomy 10/22/2021 10/22/2021  ? Substance induced mood disorder ([place])   ? Delirium tremens ([place]) 12/04/2017  ? ?Past Medical History: History reviewed. No pertinent past medical history. ?Past Surgical History:  ?Past Surgical History:  ?Procedure Laterality Date  ? LAPAROTOMY N/A 10/22/2021  ? Procedure: EXPLORATORY LAPAROTOMY hartmans;  Surgeon: Leighton Ruff, MD;  Location: [facility] ORS;  Service: General;  Laterality: N/A;  ? ?HPI:  ?61 year old lady who lives at home with her boyfriend, admitted with abdominal pain.  Has a history of alcohol use.  Was found to have perforated sigmoid colon.  Underwent ex lap Hartman's procedure, gastrostomy tube placement on 10-22-2021.  Is on TPN for severe protein calorie malnutrition.  History of chronic right proximal humerus fracture as well.  Surgical colleagues following.  Patient to have clamping trials with NG.  A palliative consultation has been requested for CODE STATUS and goals of care discussions.  Patient is awake alert resting in bed.  She has some degree of slurred speech, speaks slowly and  deliberately, however is able to answer all questions and is able to give her history and is able to understand and participate in the discussions.  ? ?Assessment / Plan / Recommendation ?Clinical Impression ? SLP initiated speech/language evaluation. Pt's largest deficits is her significant dysarthria evidenced by decreased phonatory strength, imprecise articulation and rapid rate of speech resulting in significantly impaired intelligibility.  SLP provided extensive oral care (over approx 25 minutes) as pt has dried secretions retained on soft palate, hard palate and tongue - negatively impacting her articulation abilities.  Generalized weakness noted without focal CN deficits.  Pt was oriented to person, place, year but needed cues for specific date (read white board) and her medical situation.  After oral care, SLP reviewed importance of mouth care for pulmonary health with pt using teach back and she recalled this at the end of the session.  SLP reviewed speech intelligiblity compensation strategies found effective during evalaution/diagnostic treatment including "chunking information", speaker louder and slow rate - With these strategies and environmental controls *turning off tv, etc., pt's intelligiblity improves significantly.  She expressed gratitude for oral care, stating it will be better for "[name]" to kiss her - He is her significant other per face sheet.  Pt was confused toward end of session and stated that she wanted to go to her 2nd room. She is appropriate during conversation however.  Will continue efforts for further diagnostic treatment of speech and cognition.  Posted signs in room for pt's compensations ?   ?SLP Assessment ? SLP Recommendation/Assessment: Patient needs continued [facility] ?SLP Visit Diagnosis: Dysarthria and anarthria (R47.1);Cognitive communication deficit (R41.841)  ?  ?  Recommendations for follow up therapy are one component of a multi-disciplinary  discharge planning process, led by the attending physician.  Recommendations may be updated based on patient status, additional functional criteria and insurance authorization. ?   ?Follow Up Recommendations ? Skilled nursing-short term rehab (<3 hours/day)  ?  ?Assistance Recommended at Discharge ? Frequent or constant Supervision/Assistance  ?Functional Status Assessment Patient has had a recent decline in their functional status and demonstrates the ability to make significant improvements in function in a reasonable and predictable amount of time.  ?Frequency and Duration min 1 x/week  ?1 week ?  ?   ?SLP Evaluation ?Cognition ? Overall Cognitive Status: Difficult to assess ?Arousal/Alertness: Awake/alert ?Orientation Level: Oriented to person;Oriented to place;Disoriented to situation;Disoriented to time ?Year: 2023 ?Attention: Sustained ?Sustained Attention: Appears intact ?Memory: Impaired ?Memory Impairment: Retrieval deficit ?Awareness: Impaired ?Awareness Impairment: Intellectual impairment (decreased awareness to use compensation strategies for her articulation difficulties) ?Problem Solving: Impaired ?Problem Solving Impairment: Functional basic;Verbal basic (pt did not state number of days in hospital when given date of admit and current date) ?Safety/Judgment: Impaired ?Comments: Pt stating she wants to go to her other room - thinking she has 2 rooms in the hospital  ?  ?   ?Comprehension ? Auditory Comprehension ?Yes/No Questions: Not tested ?Commands: Impaired ?One Step Basic Commands: 75-100% accurate ?Two Step Basic Commands: 50-74% accurate ?Conversation: Simple ?EffectiveTechniques: Extra processing time;Repetition ?Visual Recognition/Discrimination ?Discrimination: Not tested ?Reading Comprehension ?Reading Status: Within funtional limits (pt ready date on the white board and pt read speech compensation strategies provided to her)  ?  ?Expression Expression ?Primary Mode of Expression:  Verbal ?Verbal Expression ?Overall Verbal Expression: Appears within functional limits for tasks assessed ?Initiation: No impairment ?Level of Generative/Spontaneous Verbalization: Sentence ?Repetition: No impairment (for multisyllabic words and single sentence of 8 words) ?Naming: Not tested ?Pragmatics: No impairment ?Interfering Components: Attention ?Non-Verbal Means of Communication: Not applicable ?Written Expression ?Dominant Hand: Right ?Written Expression: Not tested   ?Oral / Motor ? Oral Motor/Sensory Function ?Overall Oral Motor/Sensory Function: Generalized oral weakness ?Motor Speech ?Overall Motor Speech: Impaired ?Respiration: Impaired ?Level of Impairment: Phrase ?Phonation: Low vocal intensity ?Resonance: Within functional limits ?Articulation: Impaired ?Level of Impairment: Word ?Intelligibility: Intelligibility reduced ?Word: 75-100% accurate ?Phrase: 50-74% accurate ?Sentence: 50-74% accurate ?Conversation: Not tested ?Motor Planning: Witnin functional limits ?Motor Speech Errors: Not applicable ?Effective Techniques: Slow rate;Increased vocal intensity (chunking information)   ?        ? ?[name] ?11/02/2021, 4:47 PM ?Kathleen Lime, MS [facility] SLP ?Acute Rehab Services ?Office [phone] ?Pager [phone] ? ?

## 2021-11-02 NOTE — Progress Notes (Signed)
? ?Progress Note ? ?11 Days Post-Op  ?Subjective: ?Pt seen with WOC this AM for VAC change and ostomy change. Pain with this but overall improving. She is having ostomy output. Tongue is very edematous and speech is garbled secondary to this. She is much more alert and oriented today.  ? ?Objective: ?Vital signs in last 24 hours: ?Temp:  [97.7 ?F (36.5 ?C)-100.1 ?F (37.8 ?C)] 100.1 ?F (37.8 ?C) (04/26 3790) ?Pulse Rate:  [96-128] 96 (04/26 0800) ?Resp:  [18-28] 24 (04/26 0800) ?BP: (129-179)/(70-105) 154/77 (04/26 0800) ?SpO2:  [95 %-99 %] 98 % (04/26 0800) ?Weight:  [55.2 kg] 55.2 kg (04/26 0855) ?Last BM Date : 10/25/21 ? ?Intake/Output from previous day: ?04/25 0701 - 04/26 0700 ?In: 1958.9 [I.V.:1478.7; IV Piggyback:480.2] ?Out: 1200 [Urine:550; Drains:550; Stool:100] ?Intake/Output this shift: ?No intake/output data recorded. ? ?PE: ?General: pleasant, WD, thin female who is laying in bed in NAD ?Heart: sinus tachycardia  ?Lungs: Respiratory effort nonlabored ?Abd: soft, appropriately ttp, midline wound with some increase in granulation tissue, no fascial dehiscence, stoma viable and working with liquid stool in ostomy pouch. Gastrostomy in place with bilious drainage, drain in LLQ with bilious-feculent looking material ? ?MS: trace edema in hands bilaterally  ?Skin: warm and dry with no masses, lesions, or rashes ?Psych: A&Ox3 with an appropriate affect.  ? ? ?Lab Results:  ?Recent Labs  ?  11/01/21 ?0236 11/02/21 ?0500  ?WBC 6.7 6.4  ?HGB 8.2* 8.2*  ?HCT 25.2* 25.7*  ?PLT 132* 142*  ? ?BMET ?Recent Labs  ?  11/01/21 ?1509 11/02/21 ?0500  ?NA 145 141  ?K 4.3 3.5  ?CL 114* 111  ?CO2 24 22  ?GLUCOSE 124* 279*  ?BUN 82* 78*  ?CREATININE 2.09* 1.88*  ?CALCIUM 8.2* 8.1*  ? ?PT/INR ?No results for input(s): LABPROT, INR in the last 72 hours. ?CMP  ?   ?Component Value Date/Time  ? NA 141 11/02/2021 0500  ? K 3.5 11/02/2021 0500  ? CL 111 11/02/2021 0500  ? CO2 22 11/02/2021 0500  ? GLUCOSE 279 (H) 11/02/2021 0500   ? BUN 78 (H) 11/02/2021 0500  ? CREATININE 1.88 (H) 11/02/2021 0500  ? CALCIUM 8.1 (L) 11/02/2021 0500  ? PROT 5.6 (L) 10/31/2021 1858  ? ALBUMIN 2.6 (L) 10/31/2021 1858  ? AST 18 10/31/2021 1858  ? ALT 12 10/31/2021 1858  ? ALKPHOS 35 (L) 10/31/2021 1858  ? BILITOT 2.2 (H) 10/31/2021 1858  ? GFRNONAA 30 (L) 11/02/2021 0500  ? GFRAA >60 12/12/2017 0433  ? ?Lipase  ?   ?Component Value Date/Time  ? LIPASE 27 10/22/2021 1141  ? ? ? ? ? ?Studies/Results: ?DG CHEST PORT 1 VIEW ? ?Result Date: 11/01/2021 ?CLINICAL DATA:  Hypoxia EXAM: PORTABLE CHEST 1 VIEW COMPARISON:  Chest x-ray dated October 28, 2021 FINDINGS: Cardiac and mediastinal contours are within normal limits. Right arm PICC line with tip projecting over the expected area of the superior cavoatrial junction. Opacity of the lower left lung, likely combination of atelectasis and pleural effusion. No evidence of pneumothorax. IMPRESSION: Increased opacity of the lower left lung, likely due to worsening pleural effusion and atelectasis. Electronically Signed   By: Yetta Glassman M.D.   On: 11/01/2021 08:11  ? ?DG Abd Portable 1V ? ?Result Date: 11/01/2021 ?CLINICAL DATA:  Abdominal distension. EXAM: PORTABLE ABDOMEN - 1 VIEW COMPARISON:  10/24/2021 FINDINGS: There is a percutaneous drainage catheter with pigtail overlying the left hemipelvis. A gastrostomy tube is identified within the left hemiabdomen. Mildly dilated loops of  small bowel within the left lower quadrant of the abdomen are improved from previous exam. No new findings. IMPRESSION: 1. Improving small bowel distension. No new findings. 2. Stable position of gastrostomy tube and percutaneous drainage catheter. Electronically Signed   By: Kerby Moors M.D.   On: 11/01/2021 09:53  ? ?[place] EKG SITE RITE ? ?Result Date: 11/02/2021 ?If [employer] not attached, placement could not be confirmed due to current cardiac rhythm.  ? ?Anti-infectives: ?Anti-infectives (From admission, onward)  ? ? Start      Dose/Rate Route Frequency Ordered Stop  ? 10/27/21 1600  cefTRIAXone (ROCEPHIN) 2 g in sodium chloride 0.9 % 100 mL IVPB  Status:  Discontinued       ? 2 g ?200 mL/hr over 30 Minutes Intravenous Every 24 hours 10/27/21 0843 10/27/21 1106  ? 10/27/21 1600  piperacillin-tazobactam (ZOSYN) IVPB 3.375 g       ? 3.375 g ?12.5 mL/hr over 240 Minutes Intravenous Every 8 hours 10/27/21 1106 11/03/21 2359  ? 10/27/21 1400  metroNIDAZOLE (FLAGYL) IVPB 500 mg  Status:  Discontinued       ? 500 mg ?100 mL/hr over 60 Minutes Intravenous Every 12 hours 10/27/21 0843 10/27/21 1106  ? 10/24/21 1600  piperacillin-tazobactam (ZOSYN) IVPB 3.375 g  Status:  Discontinued       ? 3.375 g ?12.5 mL/hr over 240 Minutes Intravenous Every 8 hours 10/24/21 1422 10/27/21 0843  ? 10/24/21 1000  cefTRIAXone (ROCEPHIN) 2 g in sodium chloride 0.9 % 100 mL IVPB  Status:  Discontinued       ? 2 g ?200 mL/hr over 30 Minutes Intravenous Every 24 hours 10/24/21 0747 10/24/21 1357  ? 10/23/21 1530  vancomycin (VANCOCIN) IVPB 1000 mg/200 mL premix       ? 1,000 mg ?200 mL/hr over 60 Minutes Intravenous  Once 10/23/21 1439 10/23/21 1547  ? 10/23/21 0200  ceFEPIme (MAXIPIME) 2 g in sodium chloride 0.9 % 100 mL IVPB  Status:  Discontinued       ? 2 g ?200 mL/hr over 30 Minutes Intravenous Every 12 hours 10/22/21 2032 10/24/21 0747  ? 10/22/21 2200  cefOXitin (MEFOXIN) 2 g in sodium chloride 0.9 % 100 mL IVPB  Status:  Discontinued       ? 2 g ?200 mL/hr over 30 Minutes Intravenous Every 8 hours 10/22/21 1706 10/22/21 2032  ? 10/22/21 1956  vancomycin variable dose per unstable renal function (pharmacist dosing)  Status:  Discontinued       ?  Does not apply See admin instructions 10/22/21 1957 10/23/21 1917  ? 10/22/21 1245  vancomycin (VANCOCIN) IVPB 1000 mg/200 mL premix       ? 1,000 mg ?200 mL/hr over 60 Minutes Intravenous  Once 10/22/21 1241 10/22/21 1402  ? 10/22/21 1245  ceFEPIme (MAXIPIME) 2 g in sodium chloride 0.9 % 100 mL IVPB       ? 2 g ?200  mL/hr over 30 Minutes Intravenous  Once 10/22/21 1241 10/22/21 1321  ? 10/22/21 1230  metroNIDAZOLE (FLAGYL) IVPB 500 mg  Status:  Discontinued       ? 500 mg ?100 mL/hr over 60 Minutes Intravenous Every 12 hours 10/22/21 1222 10/24/21 1357  ? ?  ? ? ? ?Assessment/Plan ?POD#11 - PERFORATED SIGMOID COLON - status post EX LAP, HARTMAN'S PROCEDURE, Gastrostomy tube placement - 7/82/9562 Dr. Leighton Ruff ? OR FINDINGS: Purulent ascites throughout the abdomen and stool contamination in the pelvis due to necrotic perforated sigmoid colon ?-  Status post drainage of left lower quadrant fluid collection - 4/22, Cx with pseudomonas, sensitive to current abx ?- now having some stool output - start clamping trials with 4 hrs clamped and then resume LIWS and record residuals ?- will discuss duration of abx with MD ?- delirium in the setting of alcohol and substance withdrawal significantly improved ?- wound VAC for midline incision - WOC consult following ?- colostomy care - WOC consult following ?- TPN - per pharmacy consult ?  ?FEN - g-tube clamping trials, TPN for severe protein calorie malnutrition ?VTE - SQH ?ID - zosyn 4/17>>.  potentially stop tomorrow? Will discuss with MD ? ?- below per CCM -  ?Tongue edema - SLP following  ?Klebsiella oxytoca bacteremia - repeat blood cxs 4/24 negative  ?Protein calorie malnutrition - continue TPN for now ?Chronic R proximal humerus fracture ?AKI - Cr 1.88 from 2.09, continue to monitor  ?HTN ? LOS: 11 days  ? ? ? ? ?Norm Parcel, PA-C ?[facility] ?11/02/2021, 9:12 AM ?Please see Amion for pager number during day hours 7:00am-4:30pm ? ?

## 2021-11-02 NOTE — Consult Note (Signed)
[place] Nurse wound follow up: Assessed a the Bedside with [facility]CS PA-C K. Johnson  ? ?Wound type:Surgical ?Measurement:18cm x 5cm with 50% of wound depth obscured by the presence of nonviable slough ?Wound bed:As noted above ?Drainage (amount, consistency, odor) small serous ?Periwound: intact, dry ?Dressing procedure/placement/frequency: One piece of black foam removed with NPWT dressing, ostomy pouch simultaneously removed. ?Wound cleansed with NS, patted dry. One skin barrier ring fashioned at distal part of wound from 5-7 o'clock. One piece of black foam used to obliterate dead space, this is secured with drape and the dressing attached to 165mHg continuous negative pressure.An immediate seal is achieved. The dressing is labeled with the number and type of wound contact layers used in today's dressing. ? ? ?[place] ostomy follow up ?Stoma type/location: LLQ end colostomy  ?Stomal assessment/size: 1 and 3/4 inches round, raised, moist. Nonviable mucosal layer is sloughing to reveal red mucosa ?Peristomal assessment: intact.  Reabsorbing blood blister at 9 o'clock (previously noted) ?Treatment options for stomal/peristomal skin: Skin barrier ring ?Output: small stool, liquid ?Ostomy pouching: 2pc. 2 and 3/4 inch pouching system with skin barrier ring ?Education provided: None today. Patient is not able to learn at this time. In ICU, no family, garbled speech due to edematous tongue, albeit slowly clearing mentally. ?[place] in [facility] [facility]: Yes  ? ?One NPWT dressing in room for Friday's change, also one 2-piece, 2 and 3/4 inch pouching system and skin barrier ring for next pouch change. ? ?[place] will not follow, but will remain available to this patient, the nursing and medical teams.  Please re-consult if needed. ?Thank you for involving [place] this patient's POC. ?LMaudie Flakes MSN, RN, [place] CWON-AP, [place] ?Pager# ([phone] ?

## 2021-11-02 NOTE — Progress Notes (Addendum)
? ?NAME:  Jessica [name], MRN:  [id number], DOB:  Sep 08, 1960, LOS: 63 ?ADMISSION DATE:  10/22/2021, CONSULTATION DATE:  10/22/21 ?REFERRING MD:  Francia Greaves, CHIEF COMPLAINT:  abdominal pain  ? ?History of Present Illness:  ?[name] with history of alcohol use disorder with prior severe withdrawal/DT requiring ICU admission and precedex, smoking who presented to the ED today for severe diffuse abdominal pain that began the night before admission. She had had constipation for 4d PTA. Endorsed frequent alcohol use. Last drink was day PTA.  ? ?In ED she was found to have pneumoperitoneum and free fluid on CT A/P, severe lactic acidosis. She was started on broad spectrum antibiotics and has been given a total of 5L of crystalloid. Surgery was consulted and she was taken to OR where she underwent ex lap and was found to have perforated sigmoid colon, then had hartman's procedure and insertion of g-tube which was left to gravity drainage, JP bulb in pelvis. She was noted to have feculent peritonitis in the pelvis.  ? ?Pertinent  Medical History  ?Etoh use disorder ?History of DT ?Smoking ? ?Significant Hospital Events: ?Including procedures, antibiotic start and stop dates in addition to other pertinent events   ?10/22/21 ex lap, hartman's procedure, g tube placement. Extubated in OR ?4/17 Reintubated overnight with continued pressor support x2. 11 liters positive thus far with progressive AKI this am  ?4/18 Continued issues with electrolyte abnormalities overnight, remains on levo and neo. She is now 14L positive but urine output has increased  ?4/19 No issues overnight, remains 14L positive but urine output continues to improve. Start to slowly diureses today  ?4/20 weaning NE, extubated, diuresed, started trickle TF, remains on precedex for agitation  ?4/21 periods of agitation/ hallucinations, scheduled ativan helps, remains on precedex 0.7. CT A/P with suspicious for partial small bowel obstruction, with transition point in the  lower central pelvis. Increased small to moderate amount of ascites, without definite focal abscess collection. Small amount of postop free air also noted. New small left pleural effusion and left lower lobe atelectasis. New diffuse body wall edema, consistent with anasarca. Cholelithiasis.  ?4/22 On  TPN.  Did not tolerated TF yesterday -vomitted and now G tube to LIS with bilios returns.  Low grade fever +. WBC now normal. On Zosyn. Hgb < 7g% without overt bleeding - 1 U PRBC. delirium worse.  ?4/24 Remains off vasopressors, overnight with agitation requiring precedex, 5 mg Haldol, 2 mg versed ?4/25 off precedex ? ?Interim History / Subjective:  ? ?Patient out of bed to chair yesterday ?Speech eval declined by patient yesterday ?OT eval declined by patient yesterday ? ? ?Objective   ?Blood pressure (!) 154/77, pulse 96, temperature 100.1 ?F (37.8 ?C), temperature source Axillary, resp. rate (!) 24, height 5' 10.5" (1.791 m), weight 58.8 kg, SpO2 98 %. ?CVP:  [1 mmHg-3 mmHg] 1 mmHg  ?   ? ?Intake/Output Summary (Last 24 hours) at 11/02/2021 0840 ?Last data filed at 11/01/2021 0867 ?Gross per 24 hour  ?Intake 1432.2 ml  ?Output 1200 ml  ?Net 232.2 ml  ? ?Filed Weights  ? 10/30/21 0358 10/31/21 0500 11/01/21 0426  ?Weight: 59.8 kg 59 kg 58.8 kg  ? ? ?Examination: ?General:  Ill appearing thin older female lying in bed  ?HEENT: Dry tongue with dried secretions in oral cavity ?Neuro:  alert, follows simple commands, oriented x 3 ?CV: Tachycardic, no mRG ?PULM:  course breath sounds, mild accessory muscle use  ?GI: soft, hypoactive bowel sounds, midline vac  in place, ostomy site pink, left JP, Gtube to LWS ?Extremities: warm/dry, -edema  ? ? ?Resolved Hospital Problem list   ?[place] ?Lactic acidosis  ?Hyponatremia  ?Hypocalcemia ?Transaminitis ?Shock ? ?Assessment & Plan:  ? ?Sepsis due to feculent peritonitis with sigmoid perforation  ?Klebsiella Oxytoca bacteremia  ?P: ?Per Surgery  ?Continues 5 day course of Zosyn with  stop date 4/27. Blood Cultures negative from 4/24 ?TPN per surgery (started 4/19) ?Local wound care > WOC for colostomy, continues with wound vac to midline ?G-Tube with leakage - surgery following ?PT/ OT/ ST (mumbled speech post-extubated)  ?  ?Acute Hypoxic Respiratory Failure, CXR with Left pleural effusion and atelectasis  ?-Secondary to above, re-intubated overnight 4/16, Extubated 4/20 ?P: ?Wean supplemental O2 for sat goal > 92% (on 2L [place])  ?Aggressive pulmonary hygiene- IS, mobilize as able  ?Continue Chest PT  ?Continue scheduled Duonebs  ? ?HTN ?Tachycardia  ?Plan ?Cardiac Monitoring  ?Continue scheduled Metoprolol 4/24 > continue  ?PRN Labetalol, Hydralazine SBP >160  ? ?Acute Encephalopathy, multifactorial with ICU delirium, ongoing sepsis, ETOH withdrawal (should be out of window at this point)  ?Alcohol use and History of DT ?Acute Pain  ?P: ?Supplement IV thiamine, folate, MVI ?PRN Valium for agitation/anxiety  ?PRN Fentanyl  ? ?AKI ?Hypernatremia  ?Hypokalemia  ?P: ?Trend BMP  ?Strict I/Os ?Replace electrolytes as indicated ?D5W at 45m for 10 hours for hypernatremia ?Avoid nephrotoxic agents, ensure adequate renal perfusion ? ?Thrombocytopenia  ?Likely 2/2 to septic shock.  Slowly improving ?Normocytic anemia ?P:  ?Trend CBC, Transfuse <7  ?SQ heparin started 4/23 for DVT ppx  ? ?Cachexia - Prior to & Present on Admit ?Protein Calorie Malnuritioin - (ablg 2,7 at admit) - Severe Prior to & Present on Admit ?P: ?monitor ?TPN ?PICC line ordered for TPN needs ? ?Right proximal humerus fracture, pta  ?-s/p fall in March 2023 ?-will need outpt ortho f/u.  Seen by ortho 09/26/21 with plans for non weightbearing of RUE x1 month and then f/u with ortho in one month.  ? ?Best Practice (right click and "Reselect all SmartList Selections" daily)  ? ?Diet/type: NPO TPN ?DVT prophylaxis: prophylactic heparin  ?GI prophylaxis: PPI ?Lines: Central line 4/16 ..Marland Kitchenon TPN, awaiting ability to place PICC once Crt  improves. ?Foley:  N/A ?Code Status:  full code ?Last date of multidisciplinary goals of care discussion: Continue to update family daily. No family at bedside. Palliative care consulted 4/25 ? ?Critical care time: n/a  ? ?JFreda Jackson MD ?[facility] ?Office: [phone]? ? ?See Amion for personal pager ?PCCM on call pager ([phone] 7pm. ?Please call Elink 7p-7a. [phone]? ? ? ? ? ?  ?

## 2021-11-03 DIAGNOSIS — R1312 Dysphagia, oropharyngeal phase: Secondary | ICD-10-CM | POA: Diagnosis present

## 2021-11-03 DIAGNOSIS — F17200 Nicotine dependence, unspecified, uncomplicated: Secondary | ICD-10-CM

## 2021-11-03 DIAGNOSIS — E870 Hyperosmolality and hypernatremia: Secondary | ICD-10-CM

## 2021-11-03 DIAGNOSIS — E871 Hypo-osmolality and hyponatremia: Secondary | ICD-10-CM | POA: Diagnosis present

## 2021-11-03 DIAGNOSIS — R7989 Other specified abnormal findings of blood chemistry: Secondary | ICD-10-CM

## 2021-11-03 DIAGNOSIS — E876 Hypokalemia: Secondary | ICD-10-CM | POA: Insufficient documentation

## 2021-11-03 DIAGNOSIS — E440 Moderate protein-calorie malnutrition: Secondary | ICD-10-CM

## 2021-11-03 DIAGNOSIS — S42209A Unspecified fracture of upper end of unspecified humerus, initial encounter for closed fracture: Secondary | ICD-10-CM | POA: Diagnosis present

## 2021-11-03 DIAGNOSIS — J9601 Acute respiratory failure with hypoxia: Secondary | ICD-10-CM

## 2021-11-03 DIAGNOSIS — Z933 Colostomy status: Secondary | ICD-10-CM

## 2021-11-03 DIAGNOSIS — D696 Thrombocytopenia, unspecified: Secondary | ICD-10-CM | POA: Diagnosis present

## 2021-11-03 DIAGNOSIS — E878 Other disorders of electrolyte and fluid balance, not elsewhere classified: Secondary | ICD-10-CM

## 2021-11-03 DIAGNOSIS — F10931 Alcohol use, unspecified with withdrawal delirium: Secondary | ICD-10-CM

## 2021-11-03 DIAGNOSIS — Z931 Gastrostomy status: Secondary | ICD-10-CM

## 2021-11-03 DIAGNOSIS — F419 Anxiety disorder, unspecified: Secondary | ICD-10-CM

## 2021-11-03 DIAGNOSIS — G8929 Other chronic pain: Secondary | ICD-10-CM

## 2021-11-03 DIAGNOSIS — G9341 Metabolic encephalopathy: Secondary | ICD-10-CM

## 2021-11-03 DIAGNOSIS — R7881 Bacteremia: Secondary | ICD-10-CM

## 2021-11-03 HISTORY — DX: Thrombocytopenia, unspecified: D69.6

## 2021-11-03 HISTORY — DX: Hyperosmolality and hypernatremia: E87.0

## 2021-11-03 HISTORY — DX: Bacteremia: R78.81

## 2021-11-03 LAB — RETICULOCYTES
Immature Retic Fract: 19.9 % — ABNORMAL HIGH (ref 2.3–15.9)
RBC.: 2.91 MIL/uL — ABNORMAL LOW (ref 3.87–5.11)
Retic Count, Absolute: 19.5 10*3/uL (ref 19.0–186.0)
Retic Ct Pct: 0.7 % (ref 0.4–3.1)

## 2021-11-03 LAB — FERRITIN: Ferritin: 469 ng/mL — ABNORMAL HIGH (ref 11–307)

## 2021-11-03 LAB — GLUCOSE, CAPILLARY
Glucose-Capillary: 108 mg/dL — ABNORMAL HIGH (ref 70–99)
Glucose-Capillary: 110 mg/dL — ABNORMAL HIGH (ref 70–99)
Glucose-Capillary: 123 mg/dL — ABNORMAL HIGH (ref 70–99)

## 2021-11-03 LAB — AEROBIC/ANAEROBIC CULTURE W GRAM STAIN (SURGICAL/DEEP WOUND): Gram Stain: NONE SEEN

## 2021-11-03 LAB — IRON AND TIBC
Iron: 25 ug/dL — ABNORMAL LOW (ref 28–170)
Saturation Ratios: 17 % (ref 10.4–31.8)
TIBC: 146 ug/dL — ABNORMAL LOW (ref 250–450)
UIBC: 121 ug/dL

## 2021-11-03 LAB — FOLATE: Folate: 32.5 ng/mL (ref >5.9)

## 2021-11-03 LAB — COMPREHENSIVE METABOLIC PANEL
ALT: 19 U/L (ref 0–44)
AST: 33 U/L (ref 15–41)
Albumin: 2.2 g/dL — ABNORMAL LOW (ref 3.5–5.0)
Alkaline Phosphatase: 64 U/L (ref 38–126)
Anion gap: 4 — ABNORMAL LOW (ref 5–15)
BUN: 78 mg/dL — ABNORMAL HIGH (ref 6–20)
CO2: 22 mmol/L (ref 22–32)
Calcium: 8.6 mg/dL — ABNORMAL LOW (ref 8.9–10.3)
Chloride: 119 mmol/L — ABNORMAL HIGH (ref 98–111)
Creatinine, Ser: 1.95 mg/dL — ABNORMAL HIGH (ref 0.44–1.00)
GFR, Estimated: 29 mL/min — ABNORMAL LOW (ref >60)
Glucose, Bld: 123 mg/dL — ABNORMAL HIGH (ref 70–99)
Potassium: 4 mmol/L (ref 3.5–5.1)
Sodium: 145 mmol/L (ref 135–145)
Total Bilirubin: 1.4 mg/dL — ABNORMAL HIGH (ref 0.3–1.2)
Total Protein: 5.9 g/dL — ABNORMAL LOW (ref 6.5–8.1)

## 2021-11-03 LAB — TRIGLYCERIDES: Triglycerides: 155 mg/dL — ABNORMAL HIGH (ref <150)

## 2021-11-03 LAB — VITAMIN B12: Vitamin B-12: >7500 pg/mL — ABNORMAL HIGH (ref 180–914)

## 2021-11-03 LAB — MAGNESIUM: Magnesium: 2.2 mg/dL (ref 1.7–2.4)

## 2021-11-03 LAB — VITAMIN C: Vitamin C: <0.1 mg/dL — ABNORMAL LOW (ref 0.4–2.0)

## 2021-11-03 LAB — PHOSPHORUS: Phosphorus: 3.8 mg/dL (ref 2.5–4.6)

## 2021-11-03 MED ORDER — TRAVASOL 10 % IV SOLN
INTRAVENOUS | Status: AC
  Filled 2021-11-03: qty 1113.6

## 2021-11-03 MED ORDER — FENTANYL CITRATE PF 50 MCG/ML IJ SOSY
25.0000 ug | PREFILLED_SYRINGE | INTRAMUSCULAR | Status: DC | PRN
  Administered 2021-11-03 – 2021-11-07 (×7): 25 ug via INTRAVENOUS
  Filled 2021-11-03 (×7): qty 1

## 2021-11-03 MED ORDER — LORAZEPAM 2 MG/ML IJ SOLN
0.5000 mg | INTRAMUSCULAR | Status: DC | PRN
  Administered 2021-11-07 (×2): 0.5 mg via INTRAVENOUS
  Filled 2021-11-03 (×2): qty 1

## 2021-11-03 NOTE — Assessment & Plan Note (Addendum)
Came off Precedex on 11/01/2021.  She should be outside withdrawal window.  No further agitation ?-Reorientation and delirium precaution ?-Continue thiamine, folic acid and multivitamin ?-Ativan as needed ?-Started clonidine patch on 4/28 mainly for hypertension ?

## 2021-11-03 NOTE — Hospital Course (Addendum)
61 year old F with history of alcohol use disorder and prior withdrawal with DT, tobacco use disorder and recent right proximal humeral fracture presented with severe diffuse abdominal pain for 1 day and admitted to ICU with severe sepsis due to feculent peritonitis with sigmoid perforation.  She was started on IV Zosyn.  She underwent ex lap with Hartman's procedure and G-tube placement on 10/22/2021.  She required reintubation and vasopressors on 4/17.  She was extubated on 4/20.  She also required Precedex due to agitation and hallucination.  She had IR drainage of post op abd abscess 4/22(10 fr to JP)- feculent output. She was started on TPN on 4/22 due to tube feed intolerance.  She was weaned off vasopressors on 4/24.  She came off Precedex on 4/25.  Palliative medicine involved.  She was transferred to [facility] hospitalist service on 4/27 on room air.  ? ?Patient with feculent output from laparotomy site.  Repeat CT abdomen on 4/28 with new fluid collection within the cul-de-sac measuring 6.6 x 3.4 cm, improved appearance of small bowel distention.  Remains on IV Zosyn and TPN.  General surgery, IR and palliative medicine following. ? ? ?

## 2021-11-03 NOTE — Assessment & Plan Note (Signed)
Continue nonweightbearing on right arm ?-Outpatient follow-up with orthopedic surgery ?

## 2021-11-03 NOTE — Progress Notes (Signed)
Secure chat seen by Dr. Cyndia Skeeters for request for medical necessity consent for PICC versus leaving current central line in for TNA due to patient significant other unreachable.  Requested to please advise, no response at this time.   ?

## 2021-11-03 NOTE — Progress Notes (Signed)
Attempted to call significant other at both numbers listed (cell and home) with no answer at either.  No message left, will attempt to call again later.  Secure chat to [place], [place] bedside nurse to notify of inability to obtain consent for PICC at this time. ?

## 2021-11-03 NOTE — Assessment & Plan Note (Signed)
Nutrition Status: ?Nutrition Problem: Moderate Malnutrition ?Etiology: chronic illness (alcohol abuse) ?Signs/Symptoms: moderate fat depletion, moderate muscle depletion, severe muscle depletion ?Interventions: TPN ?

## 2021-11-03 NOTE — Progress Notes (Signed)
SLP Cancellation Note ? ?Patient Details ?Name: Jessica [name] ?MRN: [id number] ?DOB: 1961-04-16 ? ? ?Cancelled treatment:       Reason Eval/Treat Not Completed: Other (comment) (SLP attempted to work with pt but she is mildly dyspneic and congested.   Social worker has requested SLP call [name] regarding pt's speech - SLP asked pt permission, which she declined x2.  Advised pt will follow up on 4/28 as able for speech treatment.) ? ?Kathleen Lime, MS CCC SLP ?Acute Rehab Services ?Office [phone] ?Pager [phone] ? ? ?[name] ?11/03/2021, 3:33 PM ?

## 2021-11-03 NOTE — Assessment & Plan Note (Addendum)
Multifactorial including severe sepsis, alcohol withdrawal, ICU delirium, possible uremia, polypharmacy.  She is sleepy but wakes to voice.  Not quite alert.  Difficult to assess orientation due to aphonia.  Follows some commands.  No agitation. ?-Treat treatable causes ?-Reorientation and delirium precautions. ?-Avoid/minimize sedating medications ?

## 2021-11-03 NOTE — Assessment & Plan Note (Addendum)
Likely due to acute illness and alcohol use disorder.  Resolved. ? ?

## 2021-11-03 NOTE — Progress Notes (Signed)
Physical Therapy Treatment ?Patient Details ?Name: Jessica [name] ?MRN: [id number] ?DOB: 1961-05-11 ?Today's Date: 11/03/2021 ? ? 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  Pt intubated post op and extubated 10/27/21.    Pt with hx of ETOH abuse and recent R shoulder fx (09/20/21) with X ray from 10/27/21 indicating fx still present. ? ?  ?PT Comments  ? ? Pt dysarthric and nearly unintelligible, but able to follow single step commands consistently, demonstrating some abdominal pain. Required total assist +2 for bed mobility. Pt sat EOB for 43mnutes with some posterior lean, able to correct with min assist and complete seated exercises (see below), had BP drop but asymptomatic and it recovered (see vitals flowsheet). Could not verify RUE weightbearing status so assumed NWB, discussed with RN about an orthopedic consult, pt had full active assistive elbow flexion/extension and some shoulder flexion but limited to <90deg secondary to CVP line in R clavicular area. Positioned pt in chair position in bed upon exit. We will continue to follow her acutely to promote safe discharge to the destination below.  ?  ?Recommendations for follow up therapy are one component of a multi-disciplinary discharge planning process, led by the attending physician.  Recommendations may be updated based on patient status, additional functional criteria and insurance authorization. ? ?Follow Up Recommendations ? Skilled nursing-short term rehab (<3 hours/day) ?  ?  ?Assistance Recommended at Discharge Frequent or constant Supervision/Assistance  ?Patient can return home with the following Two people to help with walking and/or transfers;A lot of help with bathing/dressing/bathroom;Assistance with cooking/housework;Assist for transportation;Help with stairs or ramp for entrance ?  ?Equipment Recommendations ? Other (comment) (TBD)  ?  ?Recommendations for  Other Services OT consult ? ? ?  ?Precautions / Restrictions Precautions ?Precautions: Fall ?Precaution Comments: multiple lines--> L JP drain, gastrostomy tube, triple lumen central line, wound vac ?Restrictions ?Weight Bearing Restrictions: Yes ?RUE Weight Bearing: Non weight bearing ?Other Position/Activity Restrictions: recent R UE/shoulder fx - assume NWB, though per chart review RUE WB status expired on 10/27/21.  ?  ? ?Mobility ? Bed Mobility ?Overal bed mobility: Needs Assistance ?Bed Mobility: Supine to Sit, Sit to Supine ?  ?  ?Supine to sit: Total assist, +2 for physical assistance, +2 for safety/equipment ?Sit to supine: Total assist, +2 for physical assistance, +2 for safety/equipment ?  ?General bed mobility comments: Attempt to have patient log roll but needing extensive assistance, total A +2 with use of bed pad to transition supine <> sit. Bp taken in sitting, 100/56, pt reporting no symptoms. ?  ? ?Transfers ?  ?  ?  ?  ?  ?  ?  ?  ?  ?General transfer comment: Deferred 2* limited sitting balance ?  ? ?Ambulation/Gait ?  ?  ?  ?  ?  ?  ?  ?  ? ? ?Stairs ?  ?  ?  ?  ?  ? ? ?Wheelchair Mobility ?  ? ?Modified Rankin (Stroke Patients Only) ?  ? ? ?  ?Balance Overall balance assessment: Needs assistance ?Sitting-balance support: Feet supported ?Sitting balance-Leahy Scale: Poor ?Sitting balance - Comments: Patient able to sit EOB with balls of feet on floor and maintain sitting balance without BUE support but would occasionally lean posteriorly, able to correct with min A from therapist, 12 minutes seated EOB with some exercises. BP 132/90. ?Postural control: Posterior lean ?  ?  ?  ?  ?  ?  ?  ?  ?  ?  ?  ?  ?  ?  ?  ? ?  ?  Cognition Arousal/Alertness: Awake/alert ?Behavior During Therapy: [facility] for tasks assessed/performed ?Overall Cognitive Status: Difficult to assess ?Area of Impairment: Orientation, Attention, Following commands, Problem solving, Awareness, Safety/judgement ?  ?  ?  ?  ?  ?  ?  ?   ?Orientation Level: Disoriented to, Situation, Time ?Current Attention Level: Focused ?  ?Following Commands: Follows multi-step commands inconsistently, Follows one step commands consistently ?  ?  ?Problem Solving: Decreased initiation, Difficulty sequencing, Requires verbal cues, Slow processing, Requires tactile cues ?General Comments: participates with multi-modal cues, pt speaks almost constatntly however is extremely dysarthric with low volume, nearly impossible to understand ?  ?  ? ?  ?Exercises General Exercises - Lower Extremity ?Long Arc Quad: AROM, AAROM, Strengthening, Both, 5 reps, Seated ?Low Level/ICU Exercises ?Ankle Circles/Pumps: AROM, Both, 10 reps, Supine ?Heel Slides: AAROM, Both, 10 reps, Supine ?Shoulder Flexion: AAROM, Both, 5 reps ?Elbow Flexion: AAROM, Both, 5 reps ? ?  ?General Comments   ?  ?  ? ?Pertinent Vitals/Pain Pain Assessment ?Pain Assessment: Faces ?Faces Pain Scale: Hurts little more ?Breathing: normal ?Negative Vocalization: occasional moan/groan, low speech, negative/disapproving quality ?Facial Expression: smiling or inexpressive ?Body Language: relaxed ?Consolability: no need to console ?PAINAD Score: 1 ?Facial Expression: Relaxed, neutral ?Body Movements: Absence of movements ?Muscle Tension: Tense, rigid ?Compliance with ventilator (intubated pts.): N/A ?Vocalization (extubated pts.): Talking in normal tone or no sound ?CPOT Total: 1 ?Pain Location: Abdomen ?Pain Descriptors / Indicators: Discomfort, Grimacing ?Pain Intervention(s): Monitored during session, Repositioned  ? ? ?Home Living Family/patient expects to be discharged to:: Private residence ?Living Arrangements: Spouse/significant other ?  ?  ?  ?  ?  ?  ?  ?  ?Additional Comments: Pt unable to provide information. Prior stay in 2019 pt was home with spouse PRN in one level house.  ?  ?Prior Function    ?  ?  ?   ? ?PT Goals (current goals can now be found in the care plan section) Acute Rehab PT  Goals ?Patient Stated Goal: Lie back down ?PT Goal Formulation: With patient ?Time For Goal Achievement: 11/11/21 ?Potential to Achieve Goals: Fair ?Progress towards PT goals: Progressing toward goals ? ?  ?Frequency ? ? ? Min 2X/week ? ? ? ?  ?PT Plan Current plan remains appropriate;Frequency needs to be updated  ? ? ?Co-evaluation   ?  ?  ?  ?  ? ?  ?AM-PAC PT "6 Clicks" Mobility   ?Outcome Measure ? Help needed turning from your back to your side while in a flat bed without using bedrails?: Total ?Help needed moving from lying on your back to sitting on the side of a flat bed without using bedrails?: Total ?Help needed moving to and from a bed to a chair (including a wheelchair)?: Total ?Help needed standing up from a chair using your arms (e.g., wheelchair or bedside chair)?: Total ?Help needed to walk in hospital room?: Total ?Help needed climbing 3-5 steps with a railing? : Total ?6 Click Score: 6 ? ?  ?End of Session   ?Activity Tolerance: Patient limited by fatigue;Other (comment) (cognition) ?Patient left: in bed;with call bell/phone within reach;with bed alarm set;Other (comment) (Bed placed in semi-chair position, nurse notified) ?Nurse Communication: Mobility status ?PT Visit Diagnosis: Muscle weakness (generalized) (M62.81);History of falling (Z91.81);Difficulty in walking, not elsewhere classified (R26.2);Pain ?Pain - part of body:  (abdomen) ?  ? ? ?Time: 4098-1191 ?PT Time Calculation (min) (ACUTE ONLY): 42 min ? ?Charges:  $Therapeutic Activity: 38-52 mins          ?          ? ?  Coolidge Breeze, PT, DPT ?[facility] Rehabilitation Department ?Office: [phone] ?Pager: [phone] ? ? ?Coolidge Breeze ?11/03/2021, 12:51 PM ? ?

## 2021-11-03 NOTE — Assessment & Plan Note (Addendum)
Due to sepsis?  TTE basically normal but not great quality.  Does not appear fluid overloaded.  Probably dehydrated. ?

## 2021-11-03 NOTE — Assessment & Plan Note (Addendum)
Likely due to severe sepsis.  Reintubated on 4/16 and extubated on 4/20.  Respiratory culture with Pseudomonas aeruginosa.  Currently on room air. ?Covered on IV Zosyn as above. ?Pulmonary hygiene and aspiration precaution ?Continue scheduled DuoNebs ?

## 2021-11-03 NOTE — TOC Progression Note (Signed)
Transition of Care (TOC) - Progression Note  ? ? ?Patient Details  ?Name: Jessica [name] ?MRN: [id number] ?Date of Birth: 07-24-1960 ? ?Transition of Care (TOC) CM/SW Contact  ?Pamala Hayman, LCSW ?Phone Number: ?11/03/2021, 11:01 AM ? ?Clinical Narrative:    ?Met with pt yesterday afternoon and spoke with boyfriend, Rene Paci, today to discuss anticipated dc needs/ plans.  They are aware that therapies currently recommending SNF for rehab once medically stable for dc and are in full agreement with this plan.  They confirm that pt is currently uninsured but hopeful she may qualify for Medicaid.  I have reached out to Limmie Patricia with [employer] to check if we can anticipate pt may qualify for MA and if application process can be started as this could be a barrier to securing a SNF bed if not eligible/ app begun.  They are aware that TOC will continue to follow and assist with dc needs. ? ? ?Expected Discharge Plan: Home/Self Care (TBD) ?Barriers to Discharge: Continued Medical Work up ? ?Expected Discharge Plan and Services ?Expected Discharge Plan: Home/Self Care (TBD) ?  ?Discharge Planning Services: CM Consult ?  ?Living arrangements for the past 2 months: [place] ?                ?  ?  ?  ?  ?  ?  ?  ?  ?  ?  ? ? ?Social Determinants of Health (SDOH) Interventions ?  ? ?Readmission Risk Interventions ? ?  10/24/2021  ? 10:10 AM  ?Readmission Risk Prevention Plan  ?Transportation Screening Complete  ?PCP or Specialist Appt within 3-5 Days Complete  ?[place] or Home Care Consult Complete  ?Social Work Consult for [place] Planning/Counseling Complete  ?Palliative Care Screening Complete  ?Medication Review [employer] photographer) Complete  ? ? ?

## 2021-11-03 NOTE — Progress Notes (Signed)
Attempts made to contact significant other for PICC consent at both contact numbers listed in computer unsuccessful.  Spoke with [place], RN regarding possibility of medical necessity consent.  Contacting Dr. Cyndia Skeeters via secure chat as bedside nurse states doctor is not on unit at this time. ?

## 2021-11-03 NOTE — Assessment & Plan Note (Addendum)
General surgery and IR managing. ?IR drainage of post op abd abscess 4/22(10 fr to JP)- feculent output. ?Feculent output from laparotomy site on 4/27.   ?CT abdomen on 4/28 with new fluid collection within the cul-de-sac measuring 6.6 x 3.4 cm, improved appearance of small bowel distention. ?TPN per surgery-started on 4/19 due to TF intolerance ?Remains on IV Zosyn 4/20 >>> ?She looks very dehydrated from drain output and insensible loss.  LR bolus followed by maintenance ?

## 2021-11-03 NOTE — Progress Notes (Signed)
? ?Progress Note ? ?12 Days Post-Op  ?Subjective: ?Pt with increased VAC drainage since clamping G tube some yesterday and drainage in VAC canister appears more bilious today. She is still having colostomy output. She does not report worsening pain. Afebrile, stable tachycardia. Not hypotensive.  ? ?Objective: ?Vital signs in last 24 hours: ?Temp:  [97.6 ?F (36.4 ?C)-100.1 ?F (37.8 ?C)] 98.3 ?F (36.8 ?C) (04/27 1132) ?Pulse Rate:  [104-119] 119 (04/27 1200) ?Resp:  [17-27] 22 (04/27 1200) ?BP: (93-174)/(56-104) 168/88 (04/27 1200) ?SpO2:  [97 %-100 %] 100 % (04/27 1200) ?Weight:  [54.8 kg] 54.8 kg (04/27 0500) ?Last BM Date : 11/02/21 ? ?Intake/Output from previous day: ?04/26 0701 - 04/27 0700 ?In: 3341.6 [I.V.:2935.4; IV Piggyback:401.1] ?Out: 0086 [Urine:3050; Drains:340; Stool:75] ?Intake/Output this shift: ?Total I/O ?In: 447 [I.V.:400; Other:5; IV Piggyback:42] ?Out: 600 [Drains:600] ? ?PE: ?General: pleasant, WD, thin female who is laying in bed in NAD ?Heart: sinus tachycardia  ?Lungs: Respiratory effort nonlabored ?Abd: soft, appropriately ttp, VAC in place with newly bilious drainage in canister, stoma viable and working with liquid stool in ostomy pouch. Gastrostomy in place with bilious drainage, drain in LLQ with bilious-feculent looking material ?MS: trace edema in hands bilaterally  ?Skin: warm and dry with no masses, lesions, or rashes ?Psych: A&Ox3 with an appropriate affect.  ? ? ?Lab Results:  ?Recent Labs  ?  11/01/21 ?0236 11/02/21 ?0500  ?WBC 6.7 6.4  ?HGB 8.2* 8.2*  ?HCT 25.2* 25.7*  ?PLT 132* 142*  ? ? ?BMET ?Recent Labs  ?  11/02/21 ?0500 11/03/21 ?0600  ?NA 141 145  ?K 3.5 4.0  ?CL 111 119*  ?CO2 22 22  ?GLUCOSE 279* 123*  ?BUN 78* 78*  ?CREATININE 1.88* 1.95*  ?CALCIUM 8.1* 8.6*  ? ? ?PT/INR ?No results for input(s): LABPROT, INR in the last 72 hours. ?CMP  ?   ?Component Value Date/Time  ? NA 145 11/03/2021 0600  ? K 4.0 11/03/2021 0600  ? CL 119 (H) 11/03/2021 0600  ? CO2 22 11/03/2021  0600  ? GLUCOSE 123 (H) 11/03/2021 0600  ? BUN 78 (H) 11/03/2021 0600  ? CREATININE 1.95 (H) 11/03/2021 0600  ? CALCIUM 8.6 (L) 11/03/2021 0600  ? PROT 5.9 (L) 11/03/2021 0600  ? ALBUMIN 2.2 (L) 11/03/2021 0600  ? AST 33 11/03/2021 0600  ? ALT 19 11/03/2021 0600  ? ALKPHOS 64 11/03/2021 0600  ? BILITOT 1.4 (H) 11/03/2021 0600  ? GFRNONAA 29 (L) 11/03/2021 0600  ? GFRAA >60 12/12/2017 0433  ? ?Lipase  ?   ?Component Value Date/Time  ? LIPASE 27 10/22/2021 1141  ? ? ? ? ? ?Studies/Results: ?[place] EKG SITE RITE ? ?Result Date: 11/02/2021 ?If [employer] not attached, placement could not be confirmed due to current cardiac rhythm.  ? ?Anti-infectives: ?Anti-infectives (From admission, onward)  ? ? Start     Dose/Rate Route Frequency Ordered Stop  ? 10/27/21 1600  cefTRIAXone (ROCEPHIN) 2 g in sodium chloride 0.9 % 100 mL IVPB  Status:  Discontinued       ? 2 g ?200 mL/hr over 30 Minutes Intravenous Every 24 hours 10/27/21 0843 10/27/21 1106  ? 10/27/21 1600  piperacillin-tazobactam (ZOSYN) IVPB 3.375 g       ? 3.375 g ?12.5 mL/hr over 240 Minutes Intravenous Every 8 hours 10/27/21 1106 11/03/21 2359  ? 10/27/21 1400  metroNIDAZOLE (FLAGYL) IVPB 500 mg  Status:  Discontinued       ? 500 mg ?100 mL/hr over 60 Minutes  Intravenous Every 12 hours 10/27/21 0843 10/27/21 1106  ? 10/24/21 1600  piperacillin-tazobactam (ZOSYN) IVPB 3.375 g  Status:  Discontinued       ? 3.375 g ?12.5 mL/hr over 240 Minutes Intravenous Every 8 hours 10/24/21 1422 10/27/21 0843  ? 10/24/21 1000  cefTRIAXone (ROCEPHIN) 2 g in sodium chloride 0.9 % 100 mL IVPB  Status:  Discontinued       ? 2 g ?200 mL/hr over 30 Minutes Intravenous Every 24 hours 10/24/21 0747 10/24/21 1357  ? 10/23/21 1530  vancomycin (VANCOCIN) IVPB 1000 mg/200 mL premix       ? 1,000 mg ?200 mL/hr over 60 Minutes Intravenous  Once 10/23/21 1439 10/23/21 1547  ? 10/23/21 0200  ceFEPIme (MAXIPIME) 2 g in sodium chloride 0.9 % 100 mL IVPB  Status:  Discontinued       ? 2 g ?200  mL/hr over 30 Minutes Intravenous Every 12 hours 10/22/21 2032 10/24/21 0747  ? 10/22/21 2200  cefOXitin (MEFOXIN) 2 g in sodium chloride 0.9 % 100 mL IVPB  Status:  Discontinued       ? 2 g ?200 mL/hr over 30 Minutes Intravenous Every 8 hours 10/22/21 1706 10/22/21 2032  ? 10/22/21 1956  vancomycin variable dose per unstable renal function (pharmacist dosing)  Status:  Discontinued       ?  Does not apply See admin instructions 10/22/21 1957 10/23/21 1917  ? 10/22/21 1245  vancomycin (VANCOCIN) IVPB 1000 mg/200 mL premix       ? 1,000 mg ?200 mL/hr over 60 Minutes Intravenous  Once 10/22/21 1241 10/22/21 1402  ? 10/22/21 1245  ceFEPIme (MAXIPIME) 2 g in sodium chloride 0.9 % 100 mL IVPB       ? 2 g ?200 mL/hr over 30 Minutes Intravenous  Once 10/22/21 1241 10/22/21 1321  ? 10/22/21 1230  metroNIDAZOLE (FLAGYL) IVPB 500 mg  Status:  Discontinued       ? 500 mg ?100 mL/hr over 60 Minutes Intravenous Every 12 hours 10/22/21 1222 10/24/21 1357  ? ?  ? ? ? ?Assessment/Plan ?POD#12 - PERFORATED SIGMOID COLON - status post EX LAP, HARTMAN'S PROCEDURE, Gastrostomy tube placement - 1/94/1740 Dr. Leighton Ruff ? OR FINDINGS: Purulent ascites throughout the abdomen and stool contamination in the pelvis due to necrotic perforated sigmoid colon ?- Status post drainage of left lower quadrant fluid collection - 4/22, Cx with pseudomonas, sensitive to current abx ?- VAC with bilious drainage noted today and increase in volume, still having ostomy output ?- now having some stool output - clamping trials started yesterday without large residual volumes recorded but will discuss with MD  ?- will discuss duration of abx with MD - had considered watching off abx after today but given acute change may continue  ?- colostomy care - WOC consult following ?- TPN - per pharmacy consult ?  ?FEN - g-tube clamping trials, TPN for severe protein calorie malnutrition ?VTE - SQH ?ID - zosyn 4/17>>.  potentially stop today? Will discuss with  MD ? ?- below per CCM -  ?Tongue edema - SLP following  ?Klebsiella oxytoca bacteremia - repeat blood cxs 4/24 negative  ?Protein calorie malnutrition - continue TPN for now ?Chronic R proximal humerus fracture ?AKI - Cr 1.95 from 1.88, continue to monitor  ?HTN ? LOS: 12 days  ? ? ? ? ?Norm Parcel, PA-C ?[facility] ?11/03/2021, 12:15 PM ?Please see Amion for pager number during day hours 7:00am-4:30pm ? ?

## 2021-11-03 NOTE — Assessment & Plan Note (Signed)
Encourage cessation when able to comprehend. ?-Continue nicotine patch ?

## 2021-11-03 NOTE — Progress Notes (Signed)
?PROGRESS NOTE ? ?Jessica [name] MCN:[id number] DOB: 1960/08/13  ? ?PCP: Deland Pretty, MD ? ?Patient is from: Home ? ?DOA: 10/22/2021 LOS: 12 ? ?Chief complaints ?Chief Complaint  ?Patient presents with  ? Abdominal Pain  ? Weakness  ? Constipation  ?  ? ?Brief Narrative / Interim history: ?61 year old F with history of alcohol use disorder and prior withdrawal with DT, tobacco use disorder and recent right proximal humeral fracture presented with severe diffuse abdominal pain for 1 day and admitted to ICU with severe sepsis due to feculent peritonitis with sigmoid perforation.  She was started on IV Zosyn.  She underwent ex lap with Hartman's procedure and G-tube placement on 10/22/2021.  She required reintubation and vasopressors on 4/17.  She was extubated on 4/18.  She also required Precedex due to agitation and hallucination.  She was started on TPN on 4/22 due to tube feed intolerance.  She was weaned off vasopressors on 4/24.  She came off Precedex on 4/25.  Palliative medicine involved.  She was transferred to [facility] hospitalist service on 4/27 on room air.   ? ?Subjective: ?Seen and examined this morning. No major events overnight or this morning.  She has difficulty speaking and articulating probably due to dry oral mucosal membrane.  She responds no to pain.  She follows commands. ? ?Objective: ?Vitals:  ? 11/03/21 1051 11/03/21 1100 11/03/21 1132 11/03/21 1200  ?BP: 132/90 (!) 145/75  (!) 168/88  ?Pulse: (!) 119 (!) 113  (!) 119  ?Resp: (!) 22 17  (!) 22  ?Temp:   98.3 ?F (36.8 ?C)   ?TempSrc:   Axillary   ?SpO2: 100% 97%  100%  ?Weight:      ?Height:      ? ? ?Examination: ? ?GENERAL: No apparent distress.  Nontoxic. ?HEENT: MMM.  Vision and hearing grossly intact.  ?NECK: Supple.  No apparent JVD.  ?RESP: On RA.  No IWOB.  Fair aeration bilaterally. ?CVS:  RRR. Heart sounds normal.  ?ABD/GI/GU: BS+. Abd soft.  Surgical wound VAC and ostomy bag in place. ?MSK/EXT:  Moves extremities. No apparent deformity.  No edema.  ?SKIN: no apparent skin lesion or wound ?NEURO: Awake and alert.  Follows commands.  PERRL.  No facial asymmetry.  Moves all extremities.  No apparent focal neuro deficit. ?PSYCH: Calm. Normal affect.  ? ?Procedures:  ?4/15-ex lap with Hartman's procedure and gastrostomy tube placement on 6/29 by Dr. Leighton Ruff ? ?Microbiology summarized: ?4/15-blood culture with pansensitive Klebsiella Oxytoca except to ampicillin in 1 out of 2 bottles. ?4/16-MRSA PCR screen negative. ?4/22-respiratory culture with pansensitive rare Pseudomonas aeruginosa ?4/24-repeat blood cultures NGTD ? ?Assessment and Plan: ?* Severe sepsis due to perforated sigmoid colon s/p Hartmann/colostomy 10/22/2021 ?General surgery managing. ?Remains on IV Zosyn 4/20 >>> ?TPN per surgery-started on 4/19 due to TF intolerance ? ? ?Acute metabolic encephalopathy ?Multifactorial including severe sepsis, alcohol withdrawal, ICU delirium, possible uremia, polypharmacy.  Difficult to assess orientation due to dysarthria.  She is awake and alert.  She follows command.  No signs of agitation this morning. ?-Treat treatable causes ?-Reorientation and delirium precautions. ?-Avoid sedating medications ? ?Bacteremia due to Gram-negative bacteria ?On IV Zosyn 4/20>>>.  Repeat blood culture on 4/24 negative. ? ?Acute respiratory failure with hypoxia ([place]) ?Likely due to severe sepsis.  Reintubated on 4/16 and extubated on 4/20.  Respiratory culture with Pseudomonas aeruginosa.  Currently on room air. ?Covered on IV Zosyn as above. ?Pulmonary hygiene and aspiration precaution ?Continue scheduled DuoNebs ? ?Delirium  tremens ([place]) ?Came off Precedex on 11/01/2021.  She should be outside withdrawal window ?-Reorientation and delirium precaution ?-Continue thiamine, folic acid and multivitamin ?-Change Valium to Ativan as needed for anxiety but minimize use ? ?Elevated brain natriuretic peptide (BNP) level ?Due to sepsis?  TTE basically normal but not great  quality.  Does not appear fluid overloaded.  Probably dehydrated. ? ?Thrombocytopenia ([place]) ?Recent Labs  ?Lab 10/28/21 ?0302 10/29/21 ?0336 10/30/21 ?7253 10/31/21 ?0330 10/31/21 ?1858 11/01/21 ?0236 11/02/21 ?0500  ?PLT 34* 56* 92* 127* 124* 132* 142*  ?Likely due to acute illness, alcohol use disorder and malnutrition.  Improving. ?-Continue monitoring ? ? ?AKI (acute kidney injury) ([place]) ?Recent Labs  ?  10/28/21 ?0302 10/29/21 ?0336 10/30/21 ?6644 10/31/21 ?0330 10/31/21 ?1459 10/31/21 ?1858 11/01/21 ?0236 11/01/21 ?1509 11/02/21 ?0500 11/03/21 ?0600  ?BUN 48* 57* 66* 75* 75* 76* 84* 82* 78* 78*  ?CREATININE 2.12* 1.97* 2.02* 2.02* 1.98* 2.01* 1.93* 2.09* 1.88* 1.95*  ?Unknown baseline. Her Cr was in the range of 0.5-0.6 in 2019.  No interval value. ?-Continue monitoring ?-Avoid nephrotoxic meds ? ? ?Protein-calorie malnutrition, severe ([place]) ?Nutrition Status: ?Nutrition Problem: Moderate Malnutrition ?Etiology: chronic illness (alcohol abuse) ?Signs/Symptoms: moderate fat depletion, moderate muscle depletion, severe muscle depletion ?Interventions: TPN ? ?Tobacco use disorder ?Encourage cessation when able to comprehend. ?-Continue nicotine patch ? ?Chronic pain ?Recent Labs  ?  10/26/21 ?0236 10/27/21 ?0401 10/28/21 ?0302 10/29/21 ?0336 10/29/21 ?1755 10/30/21 ?0347 10/31/21 ?0330 10/31/21 ?1858 11/01/21 ?0236 11/02/21 ?0500  ?HGB 8.7* 8.4* 7.8* 6.6* 9.3* 8.8* 8.7* 8.1* 8.2* 8.2*  ?H&H relatively stable. ?-Continue monitoring ? ? ?Hypernatremia, hyponatremia, hypokalemia, hyperphosphatemia ?Resolved. ? ? ? ?Pressure skin injury ?Pressure Injury 10/27/21 Vertebral column Mid;Medial Deep Tissue Pressure Injury - Purple or maroon localized area of discolored intact skin or blood-filled blister due to damage of underlying soft tissue from pressure and/or shear. (Active)  ?10/27/21 0428  ?Location: Vertebral column  ?Location Orientation: Mid;Medial  ?Staging: Deep Tissue Pressure Injury - Purple or maroon  localized area of discolored intact skin or blood-filled blister due to damage of underlying soft tissue from pressure and/or shear.  ?Wound Description (Comments):   ?Present on Admission: No  ?Dressing Type Foam - Lift dressing to assess site every shift 11/02/21 1930  ? ?DVT prophylaxis:  ?heparin injection 5,000 Units Start: 10/30/21 1400 ?Place and maintain sequential compression device Start: 10/25/21 0954 ?SCDs Start: 10/22/21 1429 ? ?Code Status: Full code ?Family Communication: Patient and/or RN. Available if any question.  ?Level of care: Stepdown ?Status is: Inpatient ?Remains inpatient appropriate because: Severe sepsis in the setting of peritonitis requiring IV antibiotics and TPN, safe disposition/SNF ? ? ?Final disposition: SNF ?Consultants:  ?Pulmonology admitted patient ?General surgery ?Palliative medicine ? ?Sch Meds:  ?Scheduled Meds: ? chlorhexidine  15 mL Mouth Rinse BID  ? Chlorhexidine Gluconate Cloth  6 each Topical Daily  ? heparin injection (subcutaneous)  5,000 Units Subcutaneous Q8H  ? insulin aspart  0-9 Units Subcutaneous Q8H  ? ipratropium-albuterol  3 mL Nebulization Q6H  ? mouth rinse  15 mL Mouth Rinse q12n4p  ? metoprolol tartrate  5 mg Intravenous Q6H  ? nicotine  14 mg Transdermal Daily  ? sodium chloride flush  10-40 mL Intracatheter Q12H  ? sodium chloride flush  5 mL Intracatheter Q8H  ? ?Continuous Infusions: ? sodium chloride Stopped (10/25/21 1512)  ? piperacillin-tazobactam (ZOSYN)  IV 12.5 mL/hr at 11/03/21 1200  ? TPN ADULT (ION) 80 mL/hr at 11/03/21 1200  ? TPN ADULT (  ION)    ? ?PRN Meds:.acetaminophen, antiseptic oral rinse, fentaNYL (SUBLIMAZE) injection, labetalol, lip balm, LORazepam, ondansetron (ZOFRAN) IV, sodium chloride flush ? ?Antimicrobials: ?Anti-infectives (From admission, onward)  ? ? Start     Dose/Rate Route Frequency Ordered Stop  ? 10/27/21 1600  cefTRIAXone (ROCEPHIN) 2 g in sodium chloride 0.9 % 100 mL IVPB  Status:  Discontinued       ? 2 g ?200  mL/hr over 30 Minutes Intravenous Every 24 hours 10/27/21 0843 10/27/21 1106  ? 10/27/21 1600  piperacillin-tazobactam (ZOSYN) IVPB 3.375 g       ? 3.375 g ?12.5 mL/hr over 240 Minutes Intravenous Every 8 hou

## 2021-11-03 NOTE — Assessment & Plan Note (Addendum)
Resolved

## 2021-11-03 NOTE — Assessment & Plan Note (Addendum)
Recent Labs  ?  10/31/21 ?0330 10/31/21 ?1459 10/31/21 ?1858 11/01/21 ?0236 11/01/21 ?1509 11/02/21 ?0500 11/03/21 ?0600 11/05/21 ?0841 11/06/21 ?0347 11/07/21 ?0850  ?BUN 75* 75* 76* 84* 82* 78* 78* 85* 84* 74*  ?CREATININE 2.02* 1.98* 2.01* 1.93* 2.09* 1.88* 1.95* 1.95* 1.82* 1.72*  ?Unknown baseline. Her Cr was in the range of 0.5-0.6 in 2019.  No interval value. ?-Continue monitoring ?-Avoid nephrotoxic meds ?-LR bolus followed by maintenance ? ?

## 2021-11-03 NOTE — Assessment & Plan Note (Addendum)
Could be due to to intubation and encephalopathy ?-N.p.o. and oral care. ?-SLP on board ?-TPN for nutrition ? ?

## 2021-11-03 NOTE — Progress Notes (Signed)
? ? ?Referring Physician(s): ?Gross,S ? ?Supervising Physician: Corrie Mckusick ? ?Patient Status:  [facility] - In-pt ? ?Chief Complaint: ? ?Abdominal abscess ? ?Subjective: ?Pt still with mumbled speech/dysarthria, following commands; no acute changes ? ? ?Allergies: ?Patient has no known allergies. ? ?Medications: ?Prior to Admission medications   ?Medication Sig Start Date End Date Taking? Authorizing Provider  ?fluticasone (FLONASE) 50 MCG/ACT nasal spray Place 1-2 sprays into both nostrils daily as needed for allergies or rhinitis.   Yes [provider]  ?loratadine (CLARITIN) 10 MG tablet Take 10 mg by mouth daily as needed for allergies.   Yes [provider]  ?Multiple Vitamins-Minerals (MULTIVITAMIN ADULTS 50+) TABS Take 1 tablet by mouth every morning.   Yes [provider]  ?ondansetron (ZOFRAN) 8 MG tablet Take 8 mg by mouth daily as needed for nausea/vomiting. 10/10/21  Yes [provider]  ?cetirizine (ZYRTEC) 10 MG tablet Take 10 mg by mouth daily as needed for allergies.    [provider]  ?diclofenac Sodium (VOLTAREN) 1 % GEL Apply 4 g topically 4 (four) times daily. ?Patient not taking: Reported on 10/22/2021 09/21/21   Deno Etienne, DO  ?mirtazapine (REMERON SOL-TAB) 15 MG disintegrating tablet Take 1 tablet (15 mg total) by mouth at bedtime. 12/12/17 01/11/18  Arrien, Jimmy Picket, MD  ?morphine (MSIR) 15 MG tablet Take 0.5 tablets (7.5 mg total) by mouth every 4 (four) hours as needed for severe pain. ?Patient not taking: Reported on 10/22/2021 09/26/21   Vanetta Mulders, MD  ? ? ? ?Vital Signs: ?BP (!) 164/86   Pulse (!) 116   Temp 98.3 ?F (36.8 ?C) (Axillary)   Resp (!) 24   Ht 5' 10.5" (1.791 m)   Wt 120 lb 13 oz (54.8 kg)   SpO2 100%   BMI 17.09 kg/m?  ? ?Physical Exam awake,FC; LLQ drain intact, suture in place, insertion site ok, output 35 cc feculent fluid; drain flushed with return of green feculent fluid ? ?Imaging: ?DG CHEST PORT 1 VIEW ? ?Result  Date: 11/01/2021 ?CLINICAL DATA:  Hypoxia EXAM: PORTABLE CHEST 1 VIEW COMPARISON:  Chest x-ray dated October 28, 2021 FINDINGS: Cardiac and mediastinal contours are within normal limits. Right arm PICC line with tip projecting over the expected area of the superior cavoatrial junction. Opacity of the lower left lung, likely combination of atelectasis and pleural effusion. No evidence of pneumothorax. IMPRESSION: Increased opacity of the lower left lung, likely due to worsening pleural effusion and atelectasis. Electronically Signed   By: Yetta Glassman M.D.   On: 11/01/2021 08:11  ? ?DG Abd Portable 1V ? ?Result Date: 11/01/2021 ?CLINICAL DATA:  Abdominal distension. EXAM: PORTABLE ABDOMEN - 1 VIEW COMPARISON:  10/24/2021 FINDINGS: There is a percutaneous drainage catheter with pigtail overlying the left hemipelvis. A gastrostomy tube is identified within the left hemiabdomen. Mildly dilated loops of small bowel within the left lower quadrant of the abdomen are improved from previous exam. No new findings. IMPRESSION: 1. Improving small bowel distension. No new findings. 2. Stable position of gastrostomy tube and percutaneous drainage catheter. Electronically Signed   By: Kerby Moors M.D.   On: 11/01/2021 09:53  ? ?[place] EKG SITE RITE ? ?Result Date: 11/02/2021 ?If [employer] not attached, placement could not be confirmed due to current cardiac rhythm.  ? ?Labs: ? ?CBC: ?Recent Labs  ?  10/31/21 ?0330 10/31/21 ?1858 11/01/21 ?0236 11/02/21 ?0500  ?WBC 8.6 7.1 6.7 6.4  ?HGB 8.7* 8.1* 8.2* 8.2*  ?HCT 26.3* 24.7*  25.2* 25.7*  ?PLT 127* 124* 132* 142*  ? ? ?COAGS: ?Recent Labs  ?  10/22/21 ?2119 10/29/21 ?0650 10/30/21 ?0337  ?INR 1.3* 1.2 1.2  ? ? ?BMP: ?Recent Labs  ?  11/01/21 ?0236 11/01/21 ?1509 11/02/21 ?0500 11/03/21 ?0600  ?NA 148* 145 141 145  ?K 3.3* 4.3 3.5 4.0  ?CL 114* 114* 111 119*  ?CO2 '26 24 22 22  '$ ?GLUCOSE 134* 124* 279* 123*  ?BUN 84* 82* 78* 78*  ?CALCIUM 8.5* 8.2* 8.1* 8.6*  ?CREATININE 1.93* 2.09*  1.88* 1.95*  ?GFRNONAA 29* 27* 30* 29*  ? ? ?LIVER FUNCTION TESTS: ?Recent Labs  ?  10/30/21 ?3546 10/31/21 ?0330 10/31/21 ?1858 11/03/21 ?0600  ?BILITOT 1.7* 2.0* 2.2* 1.4*  ?AST '20 18 18 '$ 33  ?ALT '18 15 12 19  '$ ?ALKPHOS 34* 37* 35* 64  ?PROT 4.9* 5.2* 5.6* 5.9*  ?ALBUMIN 1.8* 1.7* 2.6* 2.2*  ? ? ?Assessment and Plan: ?Pt with hx perf sigmoid colon, s/p expl lap, Hartman's, G tube placement 4/15; s/p drainage of post op abd abscess 4/22(10 fr to JP)- feculent output ; afebrile; BP ok, remains tachy, creat 1.95(1.88), WBC 6.4, hgb 8.2, drain fl cx- rare pseudomonas; cont current tx; obtain f/u CT once output diminishes or if WBC rises- some bilious appearing output noted from [facility] with G tube clamped- may necessitate earlier imaging ; will also need drain injection before removal ? ? ?Electronically Signed: ?Autumn Messing, PA-C ?11/03/2021, 3:24 PM ? ? ?I spent a total of 15 Minutes at the the patient's bedside AND on the patient's hospital floor or unit, greater than 50% of which was counseling/coordinating care for abdominal abscess drain ? ? ? ?Patient ID: Jessica [name], female   DOB: Dec 03, 1960, 61 y.o.   MRN: [id number] ? ?

## 2021-11-03 NOTE — Assessment & Plan Note (Addendum)
Does not appear to be in pain.   ?-As needed fentanyl for pain control-reduced dose ? ?

## 2021-11-03 NOTE — Progress Notes (Signed)
PHARMACY - TOTAL PARENTERAL NUTRITION CONSULT NOTE  ? ?Indication: Prolonged ileus ? ?Patient Measurements: ?Height: 5' 10.5" (179.1 cm) ?Weight: 54.8 kg (120 lb 13 oz) ?IBW/kg (Calculated) : 69.65 ?TPN AdjBW (KG): 53.8 ?Body mass index is 17.09 kg/m?. ?Usual Weight: 53.8 kg ? ?Assessment: 61 yo F w/ PMH etoh use disorder, smoker, HTN, GERD ?s/p Exp Lap Hartmans 10/22/2021 for perforated sigmoid colon, Dr. Marcello Moores  ? ?Glucose / Insulin: no hx DM.  CBGs <150.  Minimal SSI use - 3 units/24 h ?Electrolytes: Na 145 (removed Na 4/24), K 4.0 (goal K >=4), Mag 2.2 (goal >=2) ?Renal: SCr 1.95, BUN 78 ?Hepatic: LFTs improved to WNL; T bili down to 1.4 ?Intake / Output;  UOP 3 L/day, Drains 340 mL/day, Stool 75 mL/day ?Net I/O yesterday: -148 mL ?MIVF:  none ?GI Imaging:  ?4/15 CTA: bowel perforation ?4/17 KUB ileus or pSBO ?4/20 CTA: severe ileus or pSBO ? ?GI Surgeries / Procedures:  ?10/22/21 s/p EXPLORATORY LAPAROTOMY hartmans for perforated sigmoid colon, Dr. Marcello Moores  ?4/23 IR placed drain LLQ ?Central access: CVC TL placed 10/23/21 ?TPN start date: 10/26/21 ? ?Nutritional Goals: ?Goal TPN rate is 80 mL/hr (provides ~ 111g of protein and ~ 2001 kcals per day)  ? ?RD Assessment: ?Estimated Needs ?Total Energy Estimated Needs: 1900-2150 kcal ?Total Protein Estimated Needs: 110-125 grams ?Total Fluid Estimated Needs: >/= 3 L/day ? ?Current Nutrition:  ?TPN @ 80 ml/hr  ?NPO  ?Starting NG clamping trial on 4/26 ? ?Plan:  ?Continue TPN @  80 mL/hr to provide 100% of goals ?Electrolytes in TPN: Na 0 mEq/L, K 65 mEq/L, Ca 70mq/L, Mg 531m/L, and Phos 86m46m/L. Cl:Ac max Ac ?Continue standard MVI and trace elements to TPN ?Continue folic acid & thiamine in TPN ?Continue Sensitive q8h SSI and adjust as needed ?Monitor TPN labs on Mon/Thurs ? ? ?ChrGretta ArabarmD, BCPS ?Clinical Pharmacist ?[facility] [phone]/27/2023 8:35 AM ? ?

## 2021-11-03 NOTE — Assessment & Plan Note (Addendum)
On IV Zosyn 4/20>>>.  Repeat blood culture on 4/24 negative. ?

## 2021-11-04 ENCOUNTER — Inpatient Hospital Stay (HOSPITAL_COMMUNITY): Payer: Self-pay

## 2021-11-04 DIAGNOSIS — I10 Essential (primary) hypertension: Secondary | ICD-10-CM

## 2021-11-04 DIAGNOSIS — D649 Anemia, unspecified: Secondary | ICD-10-CM

## 2021-11-04 DIAGNOSIS — R1312 Dysphagia, oropharyngeal phase: Secondary | ICD-10-CM

## 2021-11-04 DIAGNOSIS — S42294D Other nondisplaced fracture of upper end of right humerus, subsequent encounter for fracture with routine healing: Secondary | ICD-10-CM

## 2021-11-04 LAB — CBC
HCT: 26 % — ABNORMAL LOW (ref 36.0–46.0)
Hemoglobin: 8.5 g/dL — ABNORMAL LOW (ref 12.0–15.0)
MCH: 29.8 pg (ref 26.0–34.0)
MCHC: 32.7 g/dL (ref 30.0–36.0)
MCV: 91.2 fL (ref 80.0–100.0)
Platelets: 187 10*3/uL (ref 150–400)
RBC: 2.85 MIL/uL — ABNORMAL LOW (ref 3.87–5.11)
RDW: 15.7 % — ABNORMAL HIGH (ref 11.5–15.5)
WBC: 6.3 10*3/uL (ref 4.0–10.5)
nRBC: 0 % (ref 0.0–0.2)

## 2021-11-04 LAB — GLUCOSE, CAPILLARY
Glucose-Capillary: 120 mg/dL — ABNORMAL HIGH (ref 70–99)
Glucose-Capillary: 119 mg/dL — ABNORMAL HIGH (ref 70–99)
Glucose-Capillary: 123 mg/dL — ABNORMAL HIGH (ref 70–99)

## 2021-11-04 MED ORDER — IOHEXOL 9 MG/ML PO SOLN
500.0000 mL | ORAL | Status: AC
  Administered 2021-11-04: 500 mL via ORAL

## 2021-11-04 MED ORDER — LABETALOL HCL 5 MG/ML IV SOLN
10.0000 mg | INTRAVENOUS | Status: DC | PRN
  Administered 2021-11-04: 10 mg via INTRAVENOUS
  Filled 2021-11-04: qty 4

## 2021-11-04 MED ORDER — PIPERACILLIN-TAZOBACTAM 3.375 G IVPB
3.3750 g | Freq: Three times a day (TID) | INTRAVENOUS | Status: AC
  Administered 2021-11-04 – 2021-11-11 (×22): 3.375 g via INTRAVENOUS
  Filled 2021-11-04 (×22): qty 50

## 2021-11-04 MED ORDER — IOHEXOL 9 MG/ML PO SOLN
ORAL | Status: AC
  Administered 2021-11-04: 500 mL via ORAL
  Filled 2021-11-04: qty 1000

## 2021-11-04 MED ORDER — LABETALOL HCL 5 MG/ML IV SOLN
20.0000 mg | INTRAVENOUS | Status: DC | PRN
  Administered 2021-11-04: 20 mg via INTRAVENOUS
  Filled 2021-11-04: qty 4

## 2021-11-04 MED ORDER — SODIUM CHLORIDE (PF) 0.9 % IJ SOLN
INTRAMUSCULAR | Status: AC
  Administered 2021-11-04: 10 mL
  Filled 2021-11-04: qty 50

## 2021-11-04 MED ORDER — TRAVASOL 10 % IV SOLN
INTRAVENOUS | Status: AC
  Filled 2021-11-04: qty 1113.6

## 2021-11-04 MED ORDER — CLONIDINE HCL 0.1 MG/24HR TD PTWK
0.1000 mg | MEDICATED_PATCH | TRANSDERMAL | Status: DC
  Administered 2021-11-04: 0.1 mg via TRANSDERMAL
  Filled 2021-11-04: qty 1

## 2021-11-04 NOTE — Progress Notes (Signed)
RT NOTE: ? ?Pt currently in sterile procedure, will hold CPT until next round. ?

## 2021-11-04 NOTE — Progress Notes (Signed)
PHARMACY - TOTAL PARENTERAL NUTRITION CONSULT NOTE  ? ?Indication: Prolonged ileus ? ?Patient Measurements: ?Height: 5' 10.5" (179.1 cm) ?Weight: 55.3 kg (121 lb 14.6 oz) ?IBW/kg (Calculated) : 69.65 ?TPN AdjBW (KG): 53.8 ?Body mass index is 17.25 kg/m?. ?Usual Weight: 53.8 kg ? ?Assessment: 61 yo F w/ PMH etoh use disorder, smoker, HTN, GERD ?s/p Exp Lap Hartmans 10/22/2021 for perforated sigmoid colon, Dr. Marcello Moores  ? ?Below CMET results from 4/27, not checked 4/28 ?Glucose / Insulin: no hx DM.  CBGs <150.  Minimal SSI use - 3 units/24 h ?Electrolytes: Na 145 (removed Na 4/24), K 4.0 (goal K >=4), Mag 2.2 (goal >=2) ?Renal: SCr 1.95, BUN 78 ?Hepatic: LFTs improved to WNL; T bili down to 1.4 ?Intake / Output;  UOP 3 L/day, Drains 340 mL/day, Stool 75 mL/day ?Net I/O yesterday: -148 mL ?MIVF:  none ?GI Imaging:  ?4/15 CTA: bowel perforation ?4/17 KUB ileus or pSBO ?4/20 CTA: severe ileus or pSBO ? ?GI Surgeries / Procedures:  ?10/22/21 s/p EXPLORATORY LAPAROTOMY hartmans for perforated sigmoid colon, Dr. Marcello Moores  ?4/23 IR placed drain LLQ ?Central access: CVC TL placed 10/23/21 ?TPN start date: 10/26/21 ? ?Nutritional Goals: ?Goal TPN rate is 80 mL/hr (provides ~ 111g of protein and ~ 2001 kcals per day)  ? ?RD Assessment: ?Estimated Needs ?Total Energy Estimated Needs: 1900-2150 kcal ?Total Protein Estimated Needs: 110-125 grams ?Total Fluid Estimated Needs: >/= 3 L/day ? ?Current Nutrition:  ?TPN @ 80 ml/hr  ?NPO  ?Starting NG clamping trial on 4/26 ? ?Plan:  ?Continue TPN @  80 mL/hr to provide 100% of goals ?Electrolytes in TPN: Na 0 mEq/L, K 65 mEq/L, Ca 25mq/L, Mg 575m/L, and Phos 68m41m/L. Cl:Ac max Ac ?Continue standard MVI and trace elements to TPN ?Continue folic acid & thiamine in TPN ?Continue Sensitive q8h SSI and adjust as needed ?Monitor TPN labs on Mon/Thurs ? ? ?JusAdrian SaranharmD, BCPS ?Secure Chat if ?s ?11/04/2021 8:54 AM ? ? ?

## 2021-11-04 NOTE — Progress Notes (Signed)
?PROGRESS NOTE ? ?Jessica [name] GYI:[id number] DOB: 15-Nov-1960  ? ?PCP: Deland Pretty, MD ? ?Patient is from: Home ? ?DOA: 10/22/2021 LOS: 13 ? ?Chief complaints ?Chief Complaint  ?Patient presents with  ? Abdominal Pain  ? Weakness  ? Constipation  ?  ? ?Brief Narrative / Interim history: ?61 year old F with history of alcohol use disorder and prior withdrawal with DT, tobacco use disorder and recent right proximal humeral fracture presented with severe diffuse abdominal pain for 1 day and admitted to ICU with severe sepsis due to feculent peritonitis with sigmoid perforation.  She was started on IV Zosyn.  She underwent ex lap with Hartman's procedure and G-tube placement on 10/22/2021.  She required reintubation and vasopressors on 4/17.  She was extubated on 4/20.  She also required Precedex due to agitation and hallucination.  She had IR drainage of post op abd abscess 4/22(10 fr to JP)- feculent output. She was started on TPN on 4/22 due to tube feed intolerance.  She was weaned off vasopressors on 4/24.  She came off Precedex on 4/25.  Palliative medicine involved.  She was transferred to [facility] hospitalist service on 4/27 on room air.   ? ?Subjective: ?Seen and examined earlier this morning.  No major events overnight of this morning.  No specific complaints but not a great historian due to significant dysarthria/problem with phonation.  Responds no to chest pain or abdominal pain.  She reports shortness of breath sometimes.  Does not appear to be in distress. ? ?Objective: ?Vitals:  ? 11/04/21 0600 11/04/21 0646 11/04/21 0740 11/04/21 0900  ?BP: (!) 161/82   (!) 150/86  ?Pulse: (!) 103   (!) 113  ?Resp: (!) 24   (!) 21  ?Temp:   98.9 ?F (37.2 ?C)   ?TempSrc:   Axillary   ?SpO2: 99%   97%  ?Weight:  55.3 kg    ?Height:      ? ? ?Examination: ? ?GENERAL: No apparent distress.  Nontoxic. ?HEENT: MMM.  Vision and hearing grossly intact.  ?NECK: Supple.  No apparent JVD.  ?RESP: On RA.  No IWOB.  Fair aeration  bilaterally. ?CVS:  RRR. Heart sounds normal.  ?ABD/GI/GU: BS+. Abd soft, NTND.  Surgical dressing DCI.  Small purulent material in JP drain. ?MSK/EXT:  Moves extremities. No apparent deformity. No edema.  ?SKIN: Surgical wound as above. ?NEURO: Awake and alert.  Seems to be fairly oriented but difficult exam.  PERRL.  No facial asymmetry.  Moves extremities.  No apparent focal neurodeficit. ?PSYCH: Calm. Normal affect.  ? ?Procedures:  ?4/15-ex lap with Hartman's procedure and gastrostomy tube placement on 3/50 by Dr. Leighton Ruff ? ?Microbiology summarized: ?4/15-blood culture with pansensitive Klebsiella Oxytoca except to ampicillin in 1 out of 2 bottles. ?4/16-MRSA PCR screen negative. ?4/22-respiratory culture with pansensitive rare Pseudomonas aeruginosa ?4/24-repeat blood cultures NGTD ? 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 ?IR drainage of post op abd abscess 4/22(10 fr to JP)- feculent output. ?TPN per surgery-started on 4/19 due to TF intolerance ?Remains on IV Zosyn 4/20 >>> ?Repeat CT abdomen due to feculent appearing drainage from the wound ? ? ?Oropharyngeal dysphagia ?Could be due to to intubation and encephalopathy ?-Remains NPO ?-SLP on board ?-TPN for nutrition ? ?Acute metabolic encephalopathy ?Multifactorial including severe sepsis, alcohol withdrawal, ICU delirium, possible uremia, polypharmacy.  Difficult to assess orientation due to dysarthria.  She is awake and alert.  Follows commands.  No agitation. ?-Treat treatable causes ?-Reorientation and delirium precautions. ?-Avoid sedating medications ? ?Bacteremia due to Gram-negative bacteria ?On IV Zosyn 4/20>>>.  Repeat blood culture on 4/24 negative. ? ?Acute respiratory failure with hypoxia ([place]) ?Likely due to severe sepsis.  Reintubated on 4/16 and extubated on 4/20.  Respiratory culture with Pseudomonas aeruginosa.  Currently on room  air. ?Covered on IV Zosyn as above. ?Pulmonary hygiene and aspiration precaution ?Continue scheduled DuoNebs ? ?Delirium tremens ([place]) ?Came off Precedex on 11/01/2021.  She should be outside withdrawal window.  No further agitation ?-Reorientation and delirium precaution ?-Continue thiamine, folic acid and multivitamin ?-Change Valium to Ativan as needed for anxiety but minimize use ? ?Normocytic anemia ?Recent Labs  ?  10/27/21 ?0401 10/28/21 ?0302 10/29/21 ?0336 10/29/21 ?1755 10/30/21 ?2440 10/31/21 ?0330 10/31/21 ?1858 11/01/21 ?0236 11/02/21 ?0500 11/04/21 ?0600  ?HGB 8.4* 7.8* 6.6* 9.3* 8.8* 8.7* 8.1* 8.2* 8.2* 8.5*  ?H&H relatively stable. ?Continue monitoring ? ? ?Right proximal humeral fracture ?Continue nonweightbearing on right arm ?-Outpatient follow-up with orthopedic surgery ? ?Elevated brain natriuretic peptide (BNP) level ?Due to sepsis?  TTE basically normal but not great quality.  Does not appear fluid overloaded.  Probably dehydrated. ? ?AKI (acute kidney injury) ([place]) ?Recent Labs  ?  10/28/21 ?0302 10/29/21 ?0336 10/30/21 ?1027 10/31/21 ?0330 10/31/21 ?1459 10/31/21 ?1858 11/01/21 ?0236 11/01/21 ?1509 11/02/21 ?0500 11/03/21 ?0600  ?BUN 48* 57* 66* 75* 75* 76* 84* 82* 78* 78*  ?CREATININE 2.12* 1.97* 2.02* 2.02* 1.98* 2.01* 1.93* 2.09* 1.88* 1.95*  ?Unknown baseline. Her Cr was in the range of 0.5-0.6 in 2019.  No interval value. ?-Continue monitoring ?-Avoid nephrotoxic meds ? ? ?Protein-calorie malnutrition, severe ([place]) ?Nutrition Status: ?Nutrition Problem: Moderate Malnutrition ?Etiology: chronic illness (alcohol abuse) ?Signs/Symptoms: moderate fat depletion, moderate muscle depletion, severe muscle depletion ?Interventions: TPN ? ?Essential hypertension ?BP slightly elevated. ?-Continue IV metoprolol 5 mg every 6 hours ?-Increase IV labetalol to 10 mg every 2 hours as needed ? ?Tobacco use disorder ?Encourage cessation when able to comprehend. ?-Continue nicotine patch ? ?Chronic  pain ?Does not appear to be in pain.   ?-As needed fentanyl for pain control-reduced dose ? ? ?Hypernatremia, hyponatremia, hypokalemia, hyperphosphatemia ?Resolved. ? ?Thrombocytopenia ([place]) ?Likely due to acute illness and alcohol use disorder.  Resolved. ? ? ? ? ?Pressure skin injury ?Pressure Injury 10/27/21 Vertebral column Mid;Medial Deep Tissue Pressure Injury - Purple or maroon localized area of discolored intact skin or blood-filled blister due to damage of underlying soft tissue from pressure and/or shear. (Active)  ?10/27/21 0428  ?Location: Vertebral column  ?Location Orientation: Mid;Medial  ?Staging: Deep Tissue Pressure Injury - Purple or maroon localized area of discolored intact skin or blood-filled blister due to damage of underlying soft tissue from pressure and/or shear.  ?Wound Description (Comments):   ?Present on Admission: No  ?Dressing Type Foam - Lift dressing to assess site every shift 11/04/21 0815  ? ?DVT prophylaxis:  ?heparin injection 5,000 Units Start: 10/30/21 1400 ?Place and maintain sequential compression device Start: 10/25/21 0954 ?SCDs Start: 10/22/21 1429 ? ?Code Status: Full code ?Family Communication: Patient and/or RN. Available if any question.  ?Level of care: Stepdown ?Status is: Inpatient ?Remains inpatient appropriate because: Severe sepsis in the setting of peritonitis requiring IV antibiotics and TPN, safe disposition/SNF ? ? ?Final disposition: SNF ?Consultants:  ?Pulmonology admitted patient ?General surgery ?Palliative medicine ? ?Sch Meds:  ?Scheduled Meds: ? chlorhexidine  15 mL Mouth Rinse BID  ? Chlorhexidine Gluconate Cloth  6 each Topical Daily  ?  heparin injection (subcutaneous)  5,000 Units Subcutaneous Q8H  ? insulin aspart  0-9 Units Subcutaneous Q8H  ? ipratropium-albuterol  3 mL Nebulization Q6H  ? mouth rinse  15 mL Mouth Rinse q12n4p  ? metoprolol tartrate  5 mg Intravenous Q6H  ? nicotine  14 mg Transdermal Daily  ? sodium chloride flush  10-40 mL  Intracatheter Q12H  ? sodium chloride flush  5 mL Intracatheter Q8H  ? ?Continuous Infusions: ? sodium chloride Stopped (10/25/21 1512)  ? piperacillin-tazobactam (ZOSYN)  IV 3.375 g (11/04/21 1002)  ? TPN ADULT (ION) 8

## 2021-11-04 NOTE — Progress Notes (Addendum)
? ? ?13 Days Post-Op  ?Subjective: ?CC: ?Increase VAC output overnight that now looks feculent. IR drain remains purulent/feculent. G-tube to suction w/ bilious output. She is still having some colostomy output.  She shakes her head no when asked about worsening pain.  Afebrile.  Stable tachycardia.  No hypotension.  ?RN reports mental status and verbal responses are at baseline. ?Seen with attending.  ? ?Objective: ?Vital signs in last 24 hours: ?Temp:  [98.2 ?F (36.8 ?C)-99.6 ?F (37.6 ?C)] 98.2 ?F (36.8 ?C) (04/28 0334) ?Pulse Rate:  [94-119] 103 (04/28 0600) ?Resp:  [17-25] 24 (04/28 0600) ?BP: (93-169)/(56-91) 161/82 (04/28 0600) ?SpO2:  [96 %-100 %] 99 % (04/28 0600) ?Weight:  [55.3 kg] 55.3 kg (04/28 0646) ?Last BM Date : 11/03/21 ? ?Intake/Output from previous day: ?04/27 0701 - 04/28 0700 ?In: 2336.2 [I.V.:2243.2; IV Piggyback:88] ?Out: 8413 [Urine:2000; Drains:1305; Stool:85] ?Intake/Output this shift: ?No intake/output data recorded. ? ?PE: ?General: Awake and alert  ?Heart: sinus tachycardia  ?Lungs: Respiratory effort nonlabored ?Abd: At least mildly distended but soft. Difficult to assess tenderness but shakes her head no when palpating and asking if she has any tenderness. No grimacing.  No rigidity or guarding.  Gastrostomy in place with bilious drainage in canister. Drain in LLQ with purulent-feculent looking material. Stoma pink budded and viable. Ostomy bag w/ some thin liquid stool. VAC in place w/ good seal. Feculent like thin liquid in cannister. VAC removed w/ WOCN and attending. There is thin feculent material draining from the superior aspect of the midline wound.  There may be a small pinpoint hole about 4-5cm from the superior aspect that drainage is coming from but difficult to fully assess as drainage is ongoing. The rest of the fascia appears intact.  ?MS: Moves right hand to command, LUE and digits of BLE. LE's in prevalon boots.  ? ? ? ? ? ? ?Lab Results:  ?Recent Labs  ?   11/02/21 ?0500 11/04/21 ?0600  ?WBC 6.4 6.3  ?HGB 8.2* 8.5*  ?HCT 25.7* 26.0*  ?PLT 142* 187  ? ?BMET ?Recent Labs  ?  11/02/21 ?0500 11/03/21 ?0600  ?NA 141 145  ?K 3.5 4.0  ?CL 111 119*  ?CO2 22 22  ?GLUCOSE 279* 123*  ?BUN 78* 78*  ?CREATININE 1.88* 1.95*  ?CALCIUM 8.1* 8.6*  ? ?PT/INR ?No results for input(s): LABPROT, INR in the last 72 hours. ?CMP  ?   ?Component Value Date/Time  ? NA 145 11/03/2021 0600  ? K 4.0 11/03/2021 0600  ? CL 119 (H) 11/03/2021 0600  ? CO2 22 11/03/2021 0600  ? GLUCOSE 123 (H) 11/03/2021 0600  ? BUN 78 (H) 11/03/2021 0600  ? CREATININE 1.95 (H) 11/03/2021 0600  ? CALCIUM 8.6 (L) 11/03/2021 0600  ? PROT 5.9 (L) 11/03/2021 0600  ? ALBUMIN 2.2 (L) 11/03/2021 0600  ? AST 33 11/03/2021 0600  ? ALT 19 11/03/2021 0600  ? ALKPHOS 64 11/03/2021 0600  ? BILITOT 1.4 (H) 11/03/2021 0600  ? GFRNONAA 29 (L) 11/03/2021 0600  ? GFRAA >60 12/12/2017 0433  ? ?Lipase  ?   ?Component Value Date/Time  ? LIPASE 27 10/22/2021 1141  ? ? ?Studies/Results: ?[place] EKG SITE RITE ? ?Result Date: 11/02/2021 ?If [employer] not attached, placement could not be confirmed due to current cardiac rhythm.  ? ?Anti-infectives: ?Anti-infectives (From admission, onward)  ? ? Start     Dose/Rate Route Frequency Ordered Stop  ? 10/27/21 1600  cefTRIAXone (ROCEPHIN) 2 g in sodium chloride 0.9 %  100 mL IVPB  Status:  Discontinued       ? 2 g ?200 mL/hr over 30 Minutes Intravenous Every 24 hours 10/27/21 0843 10/27/21 1106  ? 10/27/21 1600  piperacillin-tazobactam (ZOSYN) IVPB 3.375 g       ? 3.375 g ?12.5 mL/hr over 240 Minutes Intravenous Every 8 hours 10/27/21 1106 11/03/21 2000  ? 10/27/21 1400  metroNIDAZOLE (FLAGYL) IVPB 500 mg  Status:  Discontinued       ? 500 mg ?100 mL/hr over 60 Minutes Intravenous Every 12 hours 10/27/21 0843 10/27/21 1106  ? 10/24/21 1600  piperacillin-tazobactam (ZOSYN) IVPB 3.375 g  Status:  Discontinued       ? 3.375 g ?12.5 mL/hr over 240 Minutes Intravenous Every 8 hours 10/24/21 1422 10/27/21  0843  ? 10/24/21 1000  cefTRIAXone (ROCEPHIN) 2 g in sodium chloride 0.9 % 100 mL IVPB  Status:  Discontinued       ? 2 g ?200 mL/hr over 30 Minutes Intravenous Every 24 hours 10/24/21 0747 10/24/21 1357  ? 10/23/21 1530  vancomycin (VANCOCIN) IVPB 1000 mg/200 mL premix       ? 1,000 mg ?200 mL/hr over 60 Minutes Intravenous  Once 10/23/21 1439 10/23/21 1547  ? 10/23/21 0200  ceFEPIme (MAXIPIME) 2 g in sodium chloride 0.9 % 100 mL IVPB  Status:  Discontinued       ? 2 g ?200 mL/hr over 30 Minutes Intravenous Every 12 hours 10/22/21 2032 10/24/21 0747  ? 10/22/21 2200  cefOXitin (MEFOXIN) 2 g in sodium chloride 0.9 % 100 mL IVPB  Status:  Discontinued       ? 2 g ?200 mL/hr over 30 Minutes Intravenous Every 8 hours 10/22/21 1706 10/22/21 2032  ? 10/22/21 1956  vancomycin variable dose per unstable renal function (pharmacist dosing)  Status:  Discontinued       ?  Does not apply See admin instructions 10/22/21 1957 10/23/21 1917  ? 10/22/21 1245  vancomycin (VANCOCIN) IVPB 1000 mg/200 mL premix       ? 1,000 mg ?200 mL/hr over 60 Minutes Intravenous  Once 10/22/21 1241 10/22/21 1402  ? 10/22/21 1245  ceFEPIme (MAXIPIME) 2 g in sodium chloride 0.9 % 100 mL IVPB       ? 2 g ?200 mL/hr over 30 Minutes Intravenous  Once 10/22/21 1241 10/22/21 1321  ? 10/22/21 1230  metroNIDAZOLE (FLAGYL) IVPB 500 mg  Status:  Discontinued       ? 500 mg ?100 mL/hr over 60 Minutes Intravenous Every 12 hours 10/22/21 1222 10/24/21 1357  ? ?  ? ? ? ?Assessment/Plan ?POD#13 - PERFORATED SIGMOID COLON - status post EX LAP, HARTMAN'S PROCEDURE, Gastrostomy tube placement - 03/25/3845 Dr. Leighton Ruff ? OR FINDINGS: Purulent ascites throughout the abdomen and stool contamination in the pelvis due to necrotic perforated sigmoid colon ?- Status post drainage of LLQ fluid collection by IR - 4/22, Cx with pseudomonas ?- Midline wound with drainage as noted above. D/C VAC. Dry dressing over midline wound and change as needed for saturation. Discussed  with MD. STAT CT A/P w/ contrast via g-tube.  ?- Restart abx ?- G-tube to LIWS and TPN ?- WOCN following for colostomy care ? ?  ?FEN - g-tube to LIWS, TPN for severe protein calorie malnutrition ?VTE - SCDs, SQH ?ID - zosyn 4/17 - 4/27; 4/28 >>  ?  ?- below per TRH -  ?Tongue edema - SLP following  ?Klebsiella oxytoca bacteremia - repeat blood cxs 4/24 negative  ?  Protein calorie malnutrition - continue TPN for now ?AKI - Cr 1.95 yesterday from 1.88, continue to monitor  ?HTN ?Tobacco use ?Etoh use w/ hx of DT's ?Hx R humerus fx - on xray 3/13. Has seen Dr. Sammuel Hines in the office on 3/20 w/ plans for non-op management, NWB x 1 month and reassessment  ? ? LOS: 13 days  ? ? ?Jillyn Ledger , PA-C ?[facility] ?11/04/2021, 8:12 AM ?Please see Amion for pager number during day hours 7:00am-4:30pm ? ?

## 2021-11-04 NOTE — Progress Notes (Addendum)
SLP Cancellation Note ? ?Patient Details ?Name: ARINA TORRY ?MRN: [id number] ?DOB: 05-29-1961 ? ? ?Cancelled treatment:       Reason Eval/Treat Not Completed: Other (comment) (pt getting cleaned up at time SLP attempted visit) ? ?Kathleen Lime, MS CCC SLP ?[facility] Services ?Office [phone] ?Pager [phone] ? ?[name] ?11/04/2021, 4:23 PM ?

## 2021-11-04 NOTE — Consult Note (Addendum)
[place] Nurse wound follow up ?Initial assessment with [facility]CS PA-C M. Maczis.  Dr. Harlow Asa to bedside for assessment and POC development. ? ?Wound type:Surgical ?Measurement:Not measured today, see Wednesday's measurement, 18cm x 5cm with 30% nonviable slough obscuring wound bed. ?Wound bed:See above ?Drainage (amount, consistency, odor) large amount of tan feculent material from apex of incision. Effluent (continual flow) obscures thorough assessment ?Periwound: intact, firm ?Dressing procedure/placement/frequency: Dry to dry gauze dressing applied, topped with ABD pad and secured with paper tape. Orders provided for nursing to discontinue the NPWT at this time and to change dry to dry gauze every 6 hours and PRN drainage strike through onto exterior dressing PRN. ? ?[place] Nurse ostomy consult note (10/22/21 Dr. Marcello Moores) ?Stoma type/location: LLQ end colostomy ?Stomal assessment/size: 1 and 3/4 inch round, raised, moist. Non viable mucosal sloughing <25% ?Peristomal assessment: with reabsorbing blood blister, full thickness skin lesion at 9 o'clock ?Treatment options for stomal/peristomal skin:  ?Output: 173ms liquid brown stool ?Ostomy pouching: 2pc. 2 and 3/4 inch ostomy pouching system with skin barrier ring. ?Education provided: None. Patient is critically ill and in the ICU. No family present. ?Enrolled patient in HDanaprogram: Yes ? ?[facility] V[facility]ing team will follow, and will remain available to this patient, the nursing and medical teams.   ? ? ?LMaudie Flakes MSN, RN, GL[place] CAstor CWON-AP, [place] ?Pager# ([phone] ?

## 2021-11-04 NOTE — Assessment & Plan Note (Addendum)
Blood pressure within acceptable range. ?-Added clonidine patch 0.1 mg on 4/27 ?-Continue IV metoprolol 5 mg every 6 hours ?-IV labetalol to 20 mg every 2 hours as needed ?

## 2021-11-04 NOTE — Progress Notes (Signed)
Peripherally Inserted Central Catheter Placement ? ?Consent obtained by MD due to medical necessity and no next of kin to contact. The benefits include less needle sticks, lab draws from the catheter, and the patient may be discharged home with the catheter. Risks include, but not limited to, infection, bleeding, blood clot (thrombus formation), and puncture of an artery; nerve damage and irregular heartbeat and possibility to perform a PICC exchange if needed/ordered by physician.  Alternatives to this procedure were also discussed.  B[facility] Power PICC patient education guide, fact sheet on infection prevention and patient information card has been provided to patient /or left at bedside.   ? ?PICC Placement Documentation  ?PICC Triple Lumen 11/04/21 Left Brachial 41 cm (Active)  ?Indication for Insertion or Continuance of Line Administration of hyperosmolar/irritating solutions (i.e. TPN, Vancomycin, etc.) 11/04/21 1204  ?Exposed Catheter (cm) 0 cm 11/04/21 1204  ?Site Assessment Clean, Dry, Intact 11/04/21 1204  ?Lumen #1 Status Flushed;Saline locked;Blood return noted 11/04/21 1204  ?Lumen #2 Status Saline locked;Flushed 11/04/21 1204  ?Lumen #3 Status Blood return noted;Saline locked;Flushed 11/04/21 1204  ?Dressing Status Antimicrobial disc in place 11/04/21 1204  ?Safety Lock Not Applicable 77/93/90 3009  ?Dressing Change Due 11/11/21 11/04/21 1204  ? ? ? ? ? ?Shon Hale ?11/04/2021, 12:24 PM ? ?

## 2021-11-04 NOTE — Progress Notes (Signed)
Dr. Harlow Asa notified of increasing amount of feculent drainage coming from midline incision. Will continue to change dressing as frequently as needed or place a eakin pouch per Dr. Tera Helper recommendations. ?

## 2021-11-04 NOTE — Progress Notes (Signed)
I have reviewed the scan with Dr. Harlow Asa. Our team has reached out to IR and asked them if the new fluid collection amenable to IR drainage. Further recs to follow.  ?

## 2021-11-04 NOTE — Assessment & Plan Note (Addendum)
Recent Labs  ?  10/29/21 ?0336 10/29/21 ?6681 10/30/21 ?5947 10/31/21 ?0330 10/31/21 ?1858 11/01/21 ?0236 11/02/21 ?0500 11/04/21 ?0600 11/06/21 ?0761 11/07/21 ?0500  ?HGB 6.6* 9.3* 8.8* 8.7* 8.1* 8.2* 8.2* 8.5* 8.2* 7.4*  ?Slight drop in Hgb.  Likely dilutional from IV fluid.  No obvious signs of bleeding. ?Continue monitoring ? ?

## 2021-11-04 NOTE — Progress Notes (Signed)
Referring Physician(s): Dr. Michaell Cowing  Supervising Physician: Mir, Mauri Reading  Patient Status:  [name] - In-pt  Chief Complaint:  Perforated sigmoid colon s/p exploratory laparotomy, Hartman's procedure and insertion of gastrostomy tube 10/22/21. Post-op fluid collection s/p drain placement in IR 10/28/21  Subjective:  Pt resting in bed. She is alert and attempts to communicate, but her speech is illegible. She is in no distress.   Allergies: Patient has no known allergies.  Medications: Prior to Admission medications   Medication Sig Start Date End Date Taking? Authorizing Provider  fluticasone (FLONASE) 50 MCG/ACT nasal spray Place 1-2 sprays into both nostrils daily as needed for allergies or rhinitis.   Yes [provider]  loratadine (CLARITIN) 10 MG tablet Take 10 mg by mouth daily as needed for allergies.   Yes [provider]  Multiple Vitamins-Minerals (MULTIVITAMIN ADULTS 50+) TABS Take 1 tablet by mouth every morning.   Yes [provider]  ondansetron (ZOFRAN) 8 MG tablet Take 8 mg by mouth daily as needed for nausea/vomiting. 10/10/21  Yes [provider]  cetirizine (ZYRTEC) 10 MG tablet Take 10 mg by mouth daily as needed for allergies.    [provider]  diclofenac Sodium (VOLTAREN) 1 % GEL Apply 4 g topically 4 (four) times daily. Patient not taking: Reported on 10/22/2021 09/21/21   Melene Plan, DO  mirtazapine (REMERON SOL-TAB) 15 MG disintegrating tablet Take 1 tablet (15 mg total) by mouth at bedtime. 12/12/17 01/11/18  Arrien, York Ram, MD  morphine (MSIR) 15 MG tablet Take 0.5 tablets (7.5 mg total) by mouth every 4 (four) hours as needed for severe pain. Patient not taking: Reported on 10/22/2021 09/26/21   Huel Cote, MD     Vital Signs: BP (!) 189/109 (BP Location: Left Leg)   Pulse (!) 113   Temp 98.9 F (37.2 C) (Axillary)   Resp 19   Ht 5' 10.5" (1.791 m)   Wt 121 lb 14.6 oz (55.3 kg)   SpO2 97%   BMI  17.25 kg/m   Physical Exam Vitals reviewed.  Constitutional:      General: She is not in acute distress.    Appearance: She is ill-appearing.  Pulmonary:     Effort: Pulmonary effort is normal. No respiratory distress.  Abdominal:     Comments:  -LLQ drain intact with sutures/statlock in place. Dressing C/D/I. Flushes/aspirates easily. Scant amt tan, feculent OP in JP. 5 cc documented OP over past 24 hours.  -LLQ colostomy in place -RUQ Gastrostomy tube in place  Skin:    General: Skin is warm and dry.  Neurological:     Mental Status: She is alert.    Imaging: CT ABDOMEN PELVIS WO CONTRAST  Result Date: 11/04/2021 CLINICAL DATA:  Postop exploratory laparotomy and Hartmann's pouch procedure. EXAM: CT ABDOMEN AND PELVIS WITHOUT CONTRAST TECHNIQUE: Multidetector CT imaging of the abdomen and pelvis was performed following the standard protocol without IV contrast. RADIATION DOSE REDUCTION: This exam was performed according to the departmental dose-optimization program which includes automated exposure control, adjustment of the mA and/or kV according to patient size and/or use of iterative reconstruction technique. COMPARISON:  10/28/2021 FINDINGS: Lower chest: Small left pleural effusion with left lower lobe airspace consolidation/atelectasis. Hepatobiliary: No focal liver abnormality identified. Gallstone measures 5 mm. Mild gallbladder wall edema is identified, image 31/2. No bile duct dilatation. Pancreas: Unremarkable. No pancreatic ductal dilatation or surrounding inflammatory changes. Spleen: Normal in size without focal abnormality. Adrenals/Urinary Tract: Normal adrenal glands. No kidney  mass, nephrolithiasis or hydronephrosis. Small foci of gas noted within the bladder. Bladder otherwise unremarkable. Stomach/Bowel: Gastrostomy tube is identified within the stomach. Evaluation of bowel pathology is significantly limited due to lack of IV contrast material and lack of enteric contrast  material within the pelvic bowel loops. The small bowel loops are upper limits of normal in caliber measuring 3 cm which may reflect postoperative ileus. This is improved when compared with 10/28/2021 when the small bowel loops measured up to 5.1 cm. There is a left lower quadrant colostomy. Hartmann's procedure has been performed. Vascular/Lymphatic: Aortic atherosclerosis. No aneurysm. Within the limitations of unenhanced technique no abdominopelvic adenopathy identified. Reproductive: No mass identified. Other: There is a percutaneous pigtail drainage catheter which enters from a left lower quadrant approach with pigtail terminating in the ventral pelvis just right of the midline, image 68/2. The margins of the previously noted anterior pelvic fluid collection are difficult to visualize separate from unopacified bowel loops. Within this limitation there appears to be decreased volume of the previously noted fluid within the ventral pelvis. The surgically placed JP drain within the posterior pelvis has been removed. Within the cul-de-sac, there is a fluid collection measuring 6.6 x 3.4 cm, image 68/2. Maturity of this fluid collection and suitability for drainage is difficult to assess due to lack of IV contrast. No additional suspicious or new fluid collections identified within the abdomen or pelvis. A few foci of intraperitoneal gas are identified, likely reflecting post up change. Musculoskeletal: No acute or significant osseous findings. Unchanged compression fracture at the L2 level. Severe degenerative disc disease noted at L3-4. IMPRESSION: 1. Evaluation of bowel pathology is significantly limited due to lack of IV contrast material and lack of enteric contrast material within the pelvic bowel loops. 2. There is a new fluid collection within the cul-de-sac measuring 6.6 x 3.4 cm. Maturity of this fluid collection and suitability for drainage is difficult to assess due to lack of IV contrast. 3. Unchanged  position of percutaneous pigtail drainage catheter which terminates in the right ventral pelvis. Previously noted fluid collection within the anterior pelvis is difficult to reassess due to factors described above. Within this limitation it appears that there has been decrease in the volume ventral fluid collection however. This is difficult to quantify however due to lack of IV contrast and presence of unopacified bowel loops in the ventral pelvis/lower abdomen. 4. Improved appearance of small bowel distension consistent with resolving small-bowel ileus 5. Small left pleural effusion with left lower lobe airspace consolidation/atelectasis. 6. Gallstone with mild gallbladder wall edema. 7. Aortic Atherosclerosis (ICD10-I70.0). Electronically Signed   By: Signa Kell M.D.   On: 11/04/2021 11:36   DG CHEST PORT 1 VIEW  Result Date: 11/01/2021 CLINICAL DATA:  Hypoxia EXAM: PORTABLE CHEST 1 VIEW COMPARISON:  Chest x-ray dated October 28, 2021 FINDINGS: Cardiac and mediastinal contours are within normal limits. Right arm PICC line with tip projecting over the expected area of the superior cavoatrial junction. Opacity of the lower left lung, likely combination of atelectasis and pleural effusion. No evidence of pneumothorax. IMPRESSION: Increased opacity of the lower left lung, likely due to worsening pleural effusion and atelectasis. Electronically Signed   By: Allegra Lai M.D.   On: 11/01/2021 08:11   DG Abd Portable 1V  Result Date: 11/01/2021 CLINICAL DATA:  Abdominal distension. EXAM: PORTABLE ABDOMEN - 1 VIEW COMPARISON:  10/24/2021 FINDINGS: There is a percutaneous drainage catheter with pigtail overlying the left hemipelvis. A gastrostomy tube is identified  within the left hemiabdomen. Mildly dilated loops of small bowel within the left lower quadrant of the abdomen are improved from previous exam. No new findings. IMPRESSION: 1. Improving small bowel distension. No new findings. 2. Stable position  of gastrostomy tube and percutaneous drainage catheter. Electronically Signed   By: Signa Kell M.D.   On: 11/01/2021 09:53   [place] EKG SITE RITE  Result Date: 11/02/2021 If Site Rite image not attached, placement could not be confirmed due to current cardiac rhythm.   Labs:  CBC: Recent Labs    10/31/21 1858 11/01/21 0236 11/02/21 0500 11/04/21 0600  WBC 7.1 6.7 6.4 6.3  HGB 8.1* 8.2* 8.2* 8.5*  HCT 24.7* 25.2* 25.7* 26.0*  PLT 124* 132* 142* 187    COAGS: Recent Labs    10/22/21 2119 10/29/21 0650 10/30/21 0337  INR 1.3* 1.2 1.2    BMP: Recent Labs    11/01/21 0236 11/01/21 1509 11/02/21 0500 11/03/21 0600  NA 148* 145 141 145  K 3.3* 4.3 3.5 4.0  CL 114* 114* 111 119*  CO2 26 24 22 22   GLUCOSE 134* 124* 279* 123*  BUN 84* 82* 78* 78*  CALCIUM 8.5* 8.2* 8.1* 8.6*  CREATININE 1.93* 2.09* 1.88* 1.95*  GFRNONAA 29* 27* 30* 29*    LIVER FUNCTION TESTS: Recent Labs    10/30/21 0337 10/31/21 0330 10/31/21 1858 11/03/21 0600  BILITOT 1.7* 2.0* 2.2* 1.4*  AST 20 18 18  33  ALT 18 15 12 19   ALKPHOS 34* 37* 35* 64  PROT 4.9* 5.2* 5.6* 5.9*  ALBUMIN 1.8* 1.7* 2.6* 2.2*    Assessment and Plan: Perforated sigmoid colon s/p exploratory laparotomy, Hartman's procedure and insertion of gastrostomy tube 10/22/21. Post-op fluid collection s/p drain placement in IR 10/28/21  Pt resting in bed. She is alert and attempts to communicate, but her speech is illegible. She is in no distress.  LLQ drain intact with sutures/statlock in place. Dressing C/D/I. Flushes/aspirates easily. Scant amt tan, feculent OP in JP. 5 cc documented OP over past 24 hours.   WBC WNL, Afebrile Pseudomonas Aeruginosa from abscess culture.   Continue documenting OP in Epic q shift.  Continue flushing TID.  Change dressing q shift or as needed.  Please call IR if difficulty flushing or sudden change in output.    Electronically Signed: Shon Hough, NP 11/04/2021, 1:44 PM   I spent  a total of 15 Minutes at the the patient's bedside AND on the patient's hospital floor or unit, greater than 50% of which was counseling/coordinating care for  s/p post-op fluid collection s/p drain placement in IR 10/28/21.

## 2021-11-05 DIAGNOSIS — R000 Tachycardia, unspecified: Secondary | ICD-10-CM | POA: Insufficient documentation

## 2021-11-05 DIAGNOSIS — E860 Dehydration: Secondary | ICD-10-CM

## 2021-11-05 DIAGNOSIS — E870 Hyperosmolality and hypernatremia: Secondary | ICD-10-CM

## 2021-11-05 HISTORY — DX: Dehydration: E86.0

## 2021-11-05 LAB — GLUCOSE, CAPILLARY
Glucose-Capillary: 117 mg/dL — ABNORMAL HIGH (ref 70–99)
Glucose-Capillary: 115 mg/dL — ABNORMAL HIGH (ref 70–99)
Glucose-Capillary: 131 mg/dL — ABNORMAL HIGH (ref 70–99)

## 2021-11-05 LAB — BASIC METABOLIC PANEL
Anion gap: 7 (ref 5–15)
BUN: 85 mg/dL — ABNORMAL HIGH (ref 6–20)
CO2: 23 mmol/L (ref 22–32)
Calcium: 8.6 mg/dL — ABNORMAL LOW (ref 8.9–10.3)
Chloride: 118 mmol/L — ABNORMAL HIGH (ref 98–111)
Creatinine, Ser: 1.95 mg/dL — ABNORMAL HIGH (ref 0.44–1.00)
GFR, Estimated: 29 mL/min — ABNORMAL LOW (ref >60)
Glucose, Bld: 126 mg/dL — ABNORMAL HIGH (ref 70–99)
Potassium: 3.9 mmol/L (ref 3.5–5.1)
Sodium: 148 mmol/L — ABNORMAL HIGH (ref 135–145)

## 2021-11-05 LAB — CULTURE, BLOOD (ROUTINE X 2)
Culture: NO GROWTH
Culture: NO GROWTH
Special Requests: ADEQUATE
Special Requests: ADEQUATE

## 2021-11-05 LAB — MAGNESIUM: Magnesium: 2.3 mg/dL (ref 1.7–2.4)

## 2021-11-05 LAB — PHOSPHORUS: Phosphorus: 4 mg/dL (ref 2.5–4.6)

## 2021-11-05 MED ORDER — DEXTROSE-NACL 5-0.45 % IV SOLN: INTRAVENOUS | Status: DC

## 2021-11-05 MED ORDER — TRAVASOL 10 % IV SOLN
INTRAVENOUS | Status: AC
  Filled 2021-11-05: qty 1113.6

## 2021-11-05 NOTE — Progress Notes (Signed)
14 Days Post-Op  ? ?Subjective/Chief Complaint: ?Pt in bed no complaints  ? ? ?Objective: ?Vital signs in last 24 hours: ?Temp:  [98.1 ?F (36.7 ?C)-99.3 ?F (37.4 ?C)] 98.1 ?F (36.7 ?C) (04/29 0400) ?Pulse Rate:  [101-117] 106 (04/29 0700) ?Resp:  [19-32] 19 (04/29 0700) ?BP: (123-218)/(60-109) 135/72 (04/29 0700) ?SpO2:  [93 %-99 %] 96 % (04/29 0741) ?Weight:  [53.1 kg] 53.1 kg (04/29 0448) ?Last BM Date : 11/03/21 ? ?Intake/Output from previous day: ?04/28 0701 - 04/29 0700 ?In: 2084 [I.V.:1946.6; IV Piggyback:137.4] ?Out: 2500 [Urine:2050; Drains:450] ?Intake/Output this shift: ?Total I/O ?In: 87.3 [I.V.:75.7; IV Piggyback:11.6] ?Out: -  ? ?General: Awake and alert  ?Heart: sinus tachycardia  ?Lungs: Respiratory effort nonlabored ?Abd: At least mildly distended but soft. Difficult to assess tenderness but shakes her head no when palpating and asking if she has any tenderness. No grimacing.  No rigidity or guarding.  Gastrostomy in place with bilious drainage in canister. Drain in LLQ with purulent-feculent looking material. Stoma pink budded and viable. Ostomy bag w/ some thin liquid stool. Dressing in place without feculent changes  ? ? ?MS: Moves right hand to command, LUE and digits of BLE. LE's in prevalon boots ? ?Lab Results:  ?Recent Labs  ?  11/04/21 ?0600  ?WBC 6.3  ?HGB 8.5*  ?HCT 26.0*  ?PLT 187  ? ?BMET ?Recent Labs  ?  11/03/21 ?0600  ?NA 145  ?K 4.0  ?CL 119*  ?CO2 22  ?GLUCOSE 123*  ?BUN 78*  ?CREATININE 1.95*  ?CALCIUM 8.6*  ? ?PT/INR ?No results for input(s): LABPROT, INR in the last 72 hours. ?ABG ?No results for input(s): PHART, HCO3 in the last 72 hours. ? ?Invalid input(s): PCO2, PO2 ? ?Studies/Results: ?CT ABDOMEN PELVIS WO CONTRAST ? ?Result Date: 11/04/2021 ?CLINICAL DATA:  Postop exploratory laparotomy and Hartmann's pouch procedure. EXAM: CT ABDOMEN AND PELVIS WITHOUT CONTRAST TECHNIQUE: Multidetector CT imaging of the abdomen and pelvis was performed following the standard protocol  without IV contrast. RADIATION DOSE REDUCTION: This exam was performed according to the departmental dose-optimization program which includes automated exposure control, adjustment of the mA and/or kV according to patient size and/or use of iterative reconstruction technique. COMPARISON:  10/28/2021 FINDINGS: Lower chest: Small left pleural effusion with left lower lobe airspace consolidation/atelectasis. Hepatobiliary: No focal liver abnormality identified. Gallstone measures 5 mm. Mild gallbladder wall edema is identified, image 31/2. No bile duct dilatation. Pancreas: Unremarkable. No pancreatic ductal dilatation or surrounding inflammatory changes. Spleen: Normal in size without focal abnormality. Adrenals/Urinary Tract: Normal adrenal glands. No kidney mass, nephrolithiasis or hydronephrosis. Small foci of gas noted within the bladder. Bladder otherwise unremarkable. Stomach/Bowel: Gastrostomy tube is identified within the stomach. Evaluation of bowel pathology is significantly limited due to lack of IV contrast material and lack of enteric contrast material within the pelvic bowel loops. The small bowel loops are upper limits of normal in caliber measuring 3 cm which may reflect postoperative ileus. This is improved when compared with 10/28/2021 when the small bowel loops measured up to 5.1 cm. There is a left lower quadrant colostomy. Hartmann's procedure has been performed. Vascular/Lymphatic: Aortic atherosclerosis. No aneurysm. Within the limitations of unenhanced technique no abdominopelvic adenopathy identified. Reproductive: No mass identified. Other: There is a percutaneous pigtail drainage catheter which enters from a left lower quadrant approach with pigtail terminating in the ventral pelvis just right of the midline, image 68/2. The margins of the previously noted anterior pelvic fluid collection are difficult to visualize separate from unopacified  bowel loops. Within this limitation there appears  to be decreased volume of the previously noted fluid within the ventral pelvis. The surgically placed JP drain within the posterior pelvis has been removed. Within the cul-de-sac, there is a fluid collection measuring 6.6 x 3.4 cm, image 68/2. Maturity of this fluid collection and suitability for drainage is difficult to assess due to lack of IV contrast. No additional suspicious or new fluid collections identified within the abdomen or pelvis. A few foci of intraperitoneal gas are identified, likely reflecting post up change. Musculoskeletal: No acute or significant osseous findings. Unchanged compression fracture at the L2 level. Severe degenerative disc disease noted at L3-4. IMPRESSION: 1. Evaluation of bowel pathology is significantly limited due to lack of IV contrast material and lack of enteric contrast material within the pelvic bowel loops. 2. There is a new fluid collection within the cul-de-sac measuring 6.6 x 3.4 cm. Maturity of this fluid collection and suitability for drainage is difficult to assess due to lack of IV contrast. 3. Unchanged position of percutaneous pigtail drainage catheter which terminates in the right ventral pelvis. Previously noted fluid collection within the anterior pelvis is difficult to reassess due to factors described above. Within this limitation it appears that there has been decrease in the volume ventral fluid collection however. This is difficult to quantify however due to lack of IV contrast and presence of unopacified bowel loops in the ventral pelvis/lower abdomen. 4. Improved appearance of small bowel distension consistent with resolving small-bowel ileus 5. Small left pleural effusion with left lower lobe airspace consolidation/atelectasis. 6. Gallstone with mild gallbladder wall edema. 7. Aortic Atherosclerosis (ICD10-I70.0). Electronically Signed   By: Kerby Moors M.D.   On: 11/04/2021 11:36   ? ?Anti-infectives: ?Anti-infectives (From admission, onward)  ? ?  Start     Dose/Rate Route Frequency Ordered Stop  ? 11/04/21 1000  piperacillin-tazobactam (ZOSYN) IVPB 3.375 g       ? 3.375 g ?12.5 mL/hr over 240 Minutes Intravenous Every 8 hours 11/04/21 0935 11/11/21 1759  ? 10/27/21 1600  cefTRIAXone (ROCEPHIN) 2 g in sodium chloride 0.9 % 100 mL IVPB  Status:  Discontinued       ? 2 g ?200 mL/hr over 30 Minutes Intravenous Every 24 hours 10/27/21 0843 10/27/21 1106  ? 10/27/21 1600  piperacillin-tazobactam (ZOSYN) IVPB 3.375 g       ? 3.375 g ?12.5 mL/hr over 240 Minutes Intravenous Every 8 hours 10/27/21 1106 11/03/21 2000  ? 10/27/21 1400  metroNIDAZOLE (FLAGYL) IVPB 500 mg  Status:  Discontinued       ? 500 mg ?100 mL/hr over 60 Minutes Intravenous Every 12 hours 10/27/21 0843 10/27/21 1106  ? 10/24/21 1600  piperacillin-tazobactam (ZOSYN) IVPB 3.375 g  Status:  Discontinued       ? 3.375 g ?12.5 mL/hr over 240 Minutes Intravenous Every 8 hours 10/24/21 1422 10/27/21 0843  ? 10/24/21 1000  cefTRIAXone (ROCEPHIN) 2 g in sodium chloride 0.9 % 100 mL IVPB  Status:  Discontinued       ? 2 g ?200 mL/hr over 30 Minutes Intravenous Every 24 hours 10/24/21 0747 10/24/21 1357  ? 10/23/21 1530  vancomycin (VANCOCIN) IVPB 1000 mg/200 mL premix       ? 1,000 mg ?200 mL/hr over 60 Minutes Intravenous  Once 10/23/21 1439 10/23/21 1547  ? 10/23/21 0200  ceFEPIme (MAXIPIME) 2 g in sodium chloride 0.9 % 100 mL IVPB  Status:  Discontinued       ?  2 g ?200 mL/hr over 30 Minutes Intravenous Every 12 hours 10/22/21 2032 10/24/21 0747  ? 10/22/21 2200  cefOXitin (MEFOXIN) 2 g in sodium chloride 0.9 % 100 mL IVPB  Status:  Discontinued       ? 2 g ?200 mL/hr over 30 Minutes Intravenous Every 8 hours 10/22/21 1706 10/22/21 2032  ? 10/22/21 1956  vancomycin variable dose per unstable renal function (pharmacist dosing)  Status:  Discontinued       ?  Does not apply See admin instructions 10/22/21 1957 10/23/21 1917  ? 10/22/21 1245  vancomycin (VANCOCIN) IVPB 1000 mg/200 mL premix       ? 1,000  mg ?200 mL/hr over 60 Minutes Intravenous  Once 10/22/21 1241 10/22/21 1402  ? 10/22/21 1245  ceFEPIme (MAXIPIME) 2 g in sodium chloride 0.9 % 100 mL IVPB       ? 2 g ?200 mL/hr over 30 Minutes Intravenous  Onc

## 2021-11-05 NOTE — Progress Notes (Signed)
?PROGRESS NOTE ? ?Jessica [name] ZOX:[id number] DOB: 08/01/1960  ? ?PCP: Deland Pretty, MD ? ?Patient is from: Home ? ?DOA: 10/22/2021 LOS: 14 ? ?Chief complaints ?Chief Complaint  ?Patient presents with  ? Abdominal Pain  ? Weakness  ? Constipation  ?  ? ?Brief Narrative / Interim history: ?61 year old F with history of alcohol use disorder and prior withdrawal with DT, tobacco use disorder and recent right proximal humeral fracture presented with severe diffuse abdominal pain for 1 day and admitted to ICU with severe sepsis due to feculent peritonitis with sigmoid perforation.  She was started on IV Zosyn.  She underwent ex lap with Hartman's procedure and G-tube placement on 10/22/2021.  She required reintubation and vasopressors on 4/17.  She was extubated on 4/20.  She also required Precedex due to agitation and hallucination.  She had IR drainage of post op abd abscess 4/22(10 fr to JP)- feculent output. She was started on TPN on 4/22 due to tube feed intolerance.  She was weaned off vasopressors on 4/24.  She came off Precedex on 4/25.  Palliative medicine involved.  She was [facility] on 4/27 on room air.  ? ?Patient with feculent output from laparotomy site.  Repeat CT abdomen on 4/28 with new fluid collection within the cul-de-sac measuring 6.6 x 3.4 cm, improved appearance of small bowel distention.  Remains on IV Zosyn and TPN.  General surgery, IR and palliative medicine following. ? ?  ? ?Subjective: ?Seen and examined earlier this morning.  No major events overnight of this morning.  Patient is sleepy but wakes to voice.  Not quite alert.  Follows some commands.  She tries to speak but very difficult due to aphonia and lethargy.  Vital stable except for mild tachycardia ? ?Objective: ?Vitals:  ? 11/05/21 0815 11/05/21 0839 11/05/21 0900 11/05/21 1000  ?BP: 131/86  (!) 145/87 130/72  ?Pulse: (!) 111  (!) 114 (!) 114  ?Resp: (!) 27  (!) 27 (!) 24  ?Temp:  98.6 ?F (37 ?C)     ?TempSrc:  Oral    ?SpO2: 95%  95% 95%  ?Weight:      ?Height:      ? ? ?Examination: ? ?GENERAL: Frail and sick appearing.  ?HEENT: Dry oral mucosa.  Vision and hearing grossly intact.  PERRL. ?NECK: Supple.  No apparent JVD.  ?RESP: 95% on RA.  No IWOB.  Fair aeration bilaterally. ?CVS:  RRR. Heart sounds normal.  ?ABD/GI/GU: BS+. Abd soft, NTND.  JP drain with minimal purulent output.  Ostomy bag with normal looking stool.  Some gray staining on the underside of laparotomy wound dressing ?MSK/EXT:  Moves extremities. No apparent deformity. No edema.  ?SKIN: no apparent skin lesion or wound ?NEURO: Sleepy but wakes to voice.  Follows some commands.  No apparent focal neuro deficit but limited exam due to aphonia and mental status ?PSYCH: Calm.  No agitation or distress. ? ?Procedures:  ?4/15-ex lap with Hartman's procedure and gastrostomy tube placement on 8/11 by Dr. Leighton Ruff ? ?Microbiology summarized: ?4/15-blood culture with pansensitive Klebsiella Oxytoca except to ampicillin in 1 out of 2 bottles. ?4/16-MRSA PCR screen negative. ?4/22-respiratory culture with pansensitive rare Pseudomonas aeruginosa ?4/24-repeat blood cultures NGTD ? 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 ?IR drainage of post op abd abscess 4/22(10 fr to JP)- feculent output. ?Feculent output from laparotomy site on 4/27.   ?CT abdomen on  4/28 with new fluid collection within the cul-de-sac measuring 6.6 x 3.4 cm, improved appearance of small bowel distention. ?TPN per surgery-started on 4/19 due to TF intolerance ?Remains on IV Zosyn 4/20 >>> ?Added D5-1/2NS at 75 cc an hour for hypernatremia and dehydration ? ?Oropharyngeal dysphagia ?Could be due to to intubation and encephalopathy ?-Remains NPO ?-SLP on board ?-TPN for nutrition ?-IV fluid as above ? ?Acute metabolic encephalopathy ?Multifactorial including severe sepsis, alcohol  withdrawal, ICU delirium, possible uremia, polypharmacy.  She is sleepy but wakes to voice.  Not quite alert.  Difficult to assess orientation due to aphonia.  Follows some commands.  No agitation. ?-Treat treatable causes ?-Reorientation and delirium precautions. ?-Avoid/minimize sedating medications ? ?Bacteremia due to Gram-negative bacteria ?On IV Zosyn 4/20>>>.  Repeat blood culture on 4/24 negative. ? ?Acute respiratory failure with hypoxia ([place]) ?Likely due to severe sepsis.  Reintubated on 4/16 and extubated on 4/20.  Respiratory culture with Pseudomonas aeruginosa.  Currently on room air. ?Covered on IV Zosyn as above. ?Pulmonary hygiene and aspiration precaution ?Continue scheduled DuoNebs ? ?Delirium tremens ([place]) ?Came off Precedex on 11/01/2021.  She should be outside withdrawal window.  No further agitation ?-Reorientation and delirium precaution ?-Continue thiamine, folic acid and multivitamin ?-Ativan as needed ?-Started clonidine patch on 4/28 mainly for hypertension ? ?Sinus tachycardia ?Multifactorial including acute infection, dehydration  ?-Antihypertensive meds as above ?-IV fluid ? ?Dehydration ?IV fluid as above. ? ?Normocytic anemia ?Recent Labs  ?  10/27/21 ?0401 10/28/21 ?0302 10/29/21 ?0336 10/29/21 ?1755 10/30/21 ?4431 10/31/21 ?0330 10/31/21 ?1858 11/01/21 ?0236 11/02/21 ?0500 11/04/21 ?0600  ?HGB 8.4* 7.8* 6.6* 9.3* 8.8* 8.7* 8.1* 8.2* 8.2* 8.5*  ?H&H relatively stable. ?Continue monitoring ? ? ?Right proximal humeral fracture ?Continue nonweightbearing on right arm ?-Outpatient follow-up with orthopedic surgery ? ?Elevated brain natriuretic peptide (BNP) level ?Due to sepsis?  TTE basically normal but not great quality.  Does not appear fluid overloaded.  Probably dehydrated. ? ?Hypernatremia, hyponatremia, hypokalemia, hyperphosphatemia ?Sodium 148.  Likely hypovolemic. ?-IV fluid as above. ? ?AKI (acute kidney injury) ([place]) ?Recent Labs  ?  10/29/21 ?0336 10/30/21 ?5400  10/31/21 ?0330 10/31/21 ?1459 10/31/21 ?1858 11/01/21 ?0236 11/01/21 ?1509 11/02/21 ?0500 11/03/21 ?0600 11/05/21 ?0841  ?BUN 57* 66* 75* 75* 76* 84* 82* 78* 78* 85*  ?CREATININE 1.97* 2.02* 2.02* 1.98* 2.01* 1.93* 2.09* 1.88* 1.95* 1.95*  ?Unknown baseline. Her Cr was in the range of 0.5-0.6 in 2019.  No interval value. ?-Continue monitoring ?-Avoid nephrotoxic meds ?-IV fluid as above. ? ? ?Protein-calorie malnutrition, severe ([place]) ?Nutrition Status: ?Nutrition Problem: Moderate Malnutrition ?Etiology: chronic illness (alcohol abuse) ?Signs/Symptoms: moderate fat depletion, moderate muscle depletion, severe muscle depletion ?Interventions: TPN ? ?Uncontrolled hypertension ?Normotensive this morning. ?-Added clonidine patch 0.1 mg on 4/27 ?-Continue IV metoprolol 5 mg every 6 hours ?-Increased IV labetalol to 20 mg every 2 hours as needed ? ?Tobacco use disorder ?Encourage cessation when able to comprehend. ?-Continue nicotine patch ? ?Chronic pain ?Does not appear to be in pain.   ?-As needed fentanyl for pain control-reduced dose ? ? ?Thrombocytopenia ([place]) ?Likely due to acute illness and alcohol use disorder.  Resolved. ? ? ? ? ?Pressure skin injury ?Pressure Injury 10/27/21 Vertebral column Mid;Medial Deep Tissue Pressure Injury - Purple or maroon localized area of discolored intact skin or blood-filled blister due to damage of underlying soft tissue from pressure and/or shear. (Active)  ?10/27/21 0428  ?Location: Vertebral column  ?Location Orientation: Mid;Medial  ?Staging: Deep Tissue Pressure Injury - Purple or  maroon localized area of discolored intact skin or blood-filled blister due to damage of underlying soft tissue from pressure and/or shear.  ?Wound Description (Comments):   ?Present on Admission: No  ?Dressing Type Foam - Lift dressing to assess site every shift 11/05/21 0800  ? ?DVT prophylaxis:  ?heparin injection 5,000 Units Start: 10/30/21 1400 ?Place and maintain sequential compression device  Start: 10/25/21 0954 ?SCDs Start: 10/22/21 1429 ? ?Code Status: Full code ?Family Communication: Patient and/or RN. Available if any question.  ?Level of care: Stepdown ?Status is: Inpatient ?Remains inpatient appropria

## 2021-11-05 NOTE — Assessment & Plan Note (Signed)
Multifactorial including acute infection, dehydration  ?-Antihypertensive meds as above ?-IV fluid ?

## 2021-11-05 NOTE — Assessment & Plan Note (Addendum)
She seems to have significant insensible loss and drain output ?-LR bolus followed by maintenance ?

## 2021-11-05 NOTE — Assessment & Plan Note (Deleted)
Recent Labs  ?Lab 10/30/21 ?2297 10/31/21 ?0330 10/31/21 ?1459 10/31/21 ?1858 11/01/21 ?0236 11/01/21 ?1509 11/02/21 ?0500 11/03/21 ?0600 11/05/21 ?0841  ?NA 146* 149* 149* 148* 148* 145 141 145 148*  ?-Started D5-1/2 NS at 75 cc an hour. ?-Recheck in the morning ?

## 2021-11-05 NOTE — Progress Notes (Signed)
PHARMACY - TOTAL PARENTERAL NUTRITION CONSULT NOTE  ? ?Indication: Prolonged ileus ? ?Patient Measurements: ?Height: 5' 10.5" (179.1 cm) ?Weight: 53.1 kg (117 lb 1 oz) ?IBW/kg (Calculated) : 69.65 ?TPN AdjBW (KG): 53.8 ?Body mass index is 16.56 kg/m?. ?Usual Weight: 53.8 kg ? ?Assessment: 61 yo F w/ PMH etoh use disorder, smoker, HTN, GERD ?s/p Exp Lap Hartmans 10/22/2021 for perforated sigmoid colon, Dr. Marcello Moores  ? ?Glucose / Insulin: no hx DM.  CBGs <150.  Minimal SSI use ?Electrolytes: Na 148 up again (removed Na 4/24), K 3.9 (goal K >=4), Mag 2.3 (goal >=2), phos 4 ?Renal: SCr 1.95 unchanged, BUN 85 up ?Hepatic: LFTs improved to WNL; T bili down to 1.4 ?Intake / Output;  UOP 2 L last 24 hr down, Drains 450 mL/day down, No Stool output charted ?Net I/O yesterday: +84 mL ?MIVF:  none ?GI Imaging:  ?4/15 CTA: bowel perforation ?4/17 KUB ileus or pSBO ?4/20 CTA: severe ileus or pSBO ? ?GI Surgeries / Procedures:  ?10/22/21 s/p EXPLORATORY LAPAROTOMY hartmans for perforated sigmoid colon, Dr. Marcello Moores  ?4/23 IR placed drain LLQ ?Central access: CVC TL placed 10/23/21 ?TPN start date: 10/26/21 ? ?Nutritional Goals: ?Goal TPN rate is 80 mL/hr (provides ~ 111g of protein and ~ 2001 kcals per day)  ? ?RD Assessment: ?Estimated Needs ?Total Energy Estimated Needs: 1900-2150 kcal ?Total Protein Estimated Needs: 110-125 grams ?Total Fluid Estimated Needs: >/= 3 L/day ? ?Current Nutrition:  ?TPN @ 80 ml/hr  ?NPO  ?Starting NG clamping trial on 4/26 ? ?Plan:  ?Continue TPN @  80 mL/hr to provide 100% of goals ?Electrolytes in TPN: Na 0 mEq/L, K 65 mEq/L, Ca 1mq/L, Mg 595m/L, and Phos 42m15m/L. Cl:Ac max Ac ?Continue standard MVI and trace elements to TPN ?Continue folic acid & thiamine in TPN ?Continue Sensitive q8h SSI and adjust as needed ?Monitor TPN labs on Mon/Thurs ?Per Md, patient is getting dehydrated, will start IVF likely D51/2NS for time being and re-evaluate ? ? ?JusAdrian SaranharmD, BCPS ?Secure Chat if ?s ?11/05/2021  10:37 AM ? ? ?

## 2021-11-06 LAB — GLUCOSE, CAPILLARY
Glucose-Capillary: 124 mg/dL — ABNORMAL HIGH (ref 70–99)
Glucose-Capillary: 128 mg/dL — ABNORMAL HIGH (ref 70–99)
Glucose-Capillary: 133 mg/dL — ABNORMAL HIGH (ref 70–99)
Glucose-Capillary: 137 mg/dL — ABNORMAL HIGH (ref 70–99)

## 2021-11-06 LAB — CBC
HCT: 25.4 % — ABNORMAL LOW (ref 36.0–46.0)
Hemoglobin: 8.2 g/dL — ABNORMAL LOW (ref 12.0–15.0)
MCH: 29.7 pg (ref 26.0–34.0)
MCHC: 32.3 g/dL (ref 30.0–36.0)
MCV: 92 fL (ref 80.0–100.0)
Platelets: 283 10*3/uL (ref 150–400)
RBC: 2.76 MIL/uL — ABNORMAL LOW (ref 3.87–5.11)
RDW: 15.9 % — ABNORMAL HIGH (ref 11.5–15.5)
WBC: 9.6 10*3/uL (ref 4.0–10.5)
nRBC: 0 % (ref 0.0–0.2)

## 2021-11-06 LAB — COMPREHENSIVE METABOLIC PANEL
ALT: 26 U/L (ref 0–44)
AST: 26 U/L (ref 15–41)
Albumin: 2.2 g/dL — ABNORMAL LOW (ref 3.5–5.0)
Alkaline Phosphatase: 100 U/L (ref 38–126)
Anion gap: 8 (ref 5–15)
BUN: 84 mg/dL — ABNORMAL HIGH (ref 6–20)
CO2: 26 mmol/L (ref 22–32)
Calcium: 8.9 mg/dL (ref 8.9–10.3)
Chloride: 118 mmol/L — ABNORMAL HIGH (ref 98–111)
Creatinine, Ser: 1.82 mg/dL — ABNORMAL HIGH (ref 0.44–1.00)
GFR, Estimated: 31 mL/min — ABNORMAL LOW (ref >60)
Glucose, Bld: 135 mg/dL — ABNORMAL HIGH (ref 70–99)
Potassium: 3.5 mmol/L (ref 3.5–5.1)
Sodium: 152 mmol/L — ABNORMAL HIGH (ref 135–145)
Total Bilirubin: 1.1 mg/dL (ref 0.3–1.2)
Total Protein: 6.8 g/dL (ref 6.5–8.1)

## 2021-11-06 LAB — MAGNESIUM: Magnesium: 2.3 mg/dL (ref 1.7–2.4)

## 2021-11-06 MED ORDER — IPRATROPIUM-ALBUTEROL 0.5-2.5 (3) MG/3ML IN SOLN
3.0000 mL | Freq: Two times a day (BID) | RESPIRATORY_TRACT | Status: DC
  Administered 2021-11-07 – 2021-11-18 (×21): 3 mL via RESPIRATORY_TRACT
  Filled 2021-11-06 (×23): qty 3

## 2021-11-06 MED ORDER — TRAVASOL 10 % IV SOLN
INTRAVENOUS | Status: AC
  Filled 2021-11-06: qty 1113.6

## 2021-11-06 MED ORDER — CHLORHEXIDINE GLUCONATE CLOTH 2 % EX PADS
6.0000 | MEDICATED_PAD | Freq: Every day | CUTANEOUS | Status: DC
  Administered 2021-11-07 – 2021-11-11 (×6): 6 via TOPICAL

## 2021-11-06 MED ORDER — PANTOPRAZOLE SODIUM 40 MG IV SOLR
40.0000 mg | INTRAVENOUS | Status: DC
  Administered 2021-11-06 – 2021-11-27 (×22): 40 mg via INTRAVENOUS
  Filled 2021-11-06 (×22): qty 10

## 2021-11-06 MED ORDER — POTASSIUM CHLORIDE 10 MEQ/50ML IV SOLN
10.0000 meq | INTRAVENOUS | Status: AC
  Administered 2021-11-06 (×4): 10 meq via INTRAVENOUS
  Filled 2021-11-06 (×4): qty 50

## 2021-11-06 MED ORDER — POTASSIUM CHLORIDE 10 MEQ/100ML IV SOLN: 10.0000 meq | INTRAVENOUS | Status: DC

## 2021-11-06 MED ORDER — DEXTROSE 5 % IV SOLN: INTRAVENOUS | Status: DC

## 2021-11-06 NOTE — Progress Notes (Signed)
?PROGRESS NOTE ? ?Jessica [name] RCV:[id number] DOB: 1960/11/16  ? ?PCP: Deland Pretty, MD ? ?Patient is from: Home ? ?DOA: 10/22/2021 LOS: 15 ? ?Chief complaints ?Chief Complaint  ?Patient presents with  ? Abdominal Pain  ? Weakness  ? Constipation  ?  ? ?Brief Narrative / Interim history: ?61 year old F with history of alcohol use disorder and prior withdrawal with DT, tobacco use disorder and recent right proximal humeral fracture presented with severe diffuse abdominal pain for 1 day and admitted to ICU with severe sepsis due to feculent peritonitis with sigmoid perforation.  She was started on IV Zosyn.  She underwent ex lap with Hartman's procedure and G-tube placement on 10/22/2021.  She required reintubation and vasopressors on 4/17.  She was extubated on 4/20.  She also required Precedex due to agitation and hallucination.  She had IR drainage of post op abd abscess 4/22(10 fr to JP)- feculent output. She was started on TPN on 4/22 due to tube feed intolerance.  She was weaned off vasopressors on 4/24.  She came off Precedex on 4/25.  Palliative medicine involved.  She was transferred to [facility] on 4/27 on room air.  ? ?Patient with feculent output from laparotomy site.  Repeat CT abdomen on 4/28 with new fluid collection within the cul-de-sac measuring 6.6 x 3.4 cm, improved appearance of small bowel distention.  Remains on IV Zosyn and TPN.  General surgery, IR and palliative medicine following. ? ? ?Subjective: ?Seen and examined earlier this morning.  No major events overnight of this morning.  She is awake and alert.  Difficult to understand what she is saying likely from dry oral mucosa.  Her tongue looks dry as well.  ? ?Objective: ?Vitals:  ? 11/06/21 0800 11/06/21 0815 11/06/21 0900 11/06/21 1000  ?BP: (!) 152/89  (!) 153/88 (!) 145/79  ?Pulse: (!) 108 (!) 107 (!) 115 (!) 115  ?Resp: (!) 22 (!) 23 (!) 22 (!) 22  ?Temp:      ?TempSrc:      ?SpO2: 95% 95% 93% 95%  ?Weight:       ?Height:      ? ? ?Examination: ? ?GENERAL: Frail and ill-appearing. ?HEENT: Dry oral mucosa.  Vision and hearing grossly intact.  ?NECK: Supple.  No apparent JVD.  ?RESP:  No IWOB.  Fair aeration bilaterally. ?CVS:  RRR. Heart sounds normal.  ?ABD/GI/GU: BS+. Abd soft, NTND.  JP drain with minimal purulent output.  Ostomy bag with normal looking stool.  Saturated laparotomy wound packing. ?MSK/EXT:  Moves extremities.  Significant muscle mass and subcu fat loss. ?SKIN: Laparotomy wound ?NEURO: Awake and alert.  Follows some commands.  No apparent focal neurodeficit but limited exam ?PSYCH: Calm.  No apparent distress or agitation. ? ?Procedures:  ?4/15-ex lap with Hartman's procedure and gastrostomy tube placement on 1/02 by Dr. Leighton Ruff ? ?Microbiology summarized: ?4/15-blood culture with pansensitive Klebsiella Oxytoca except to ampicillin in 1 out of 2 bottles. ?4/16-MRSA PCR screen negative. ?4/22-peritoneal fluid culture with pansensitive rare Pseudomonas aeruginosa ?4/24-repeat blood cultures NGTD ? 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 ?IR drainage of post op abd abscess 4/22(10 fr to JP)- feculent output. ?Feculent output from laparotomy site on 4/27.   ?CT abdomen on 4/28 with new fluid collection within the cul-de-sac measuring 6.6 x 3.4 cm, improved appearance of small bowel distention. ?TPN per surgery-started on 4/19 due to TF intolerance ?Remains on IV  Zosyn 4/20 >>> ?D5 at 100 cc an hour for hypernatremia ? ?Oropharyngeal dysphagia ?Could be due to to intubation and encephalopathy ?-N.p.o. and oral care. ?-SLP on board ?-TPN for nutrition ? ? ?Acute metabolic encephalopathy ?Multifactorial including severe sepsis, alcohol withdrawal, ICU delirium, possible uremia, polypharmacy.  She is sleepy but wakes to voice.  Not quite alert.  Difficult to assess orientation due to aphonia.  Follows some  commands.  No agitation. ?-Treat treatable causes ?-Reorientation and delirium precautions. ?-Avoid/minimize sedating medications ? ?Bacteremia due to Gram-negative bacteria ?On IV Zosyn 4/20>>>.  Repeat blood culture on 4/24 negative. ? ?Acute respiratory failure with hypoxia ([place]) ?Likely due to severe sepsis.  Reintubated on 4/16 and extubated on 4/20.  Respiratory culture with Pseudomonas aeruginosa.  Currently on room air. ?Covered on IV Zosyn as above. ?Pulmonary hygiene and aspiration precaution ?Continue scheduled DuoNebs ? ?Delirium tremens ([place]) ?Came off Precedex on 11/01/2021.  She should be outside withdrawal window.  No further agitation ?-Reorientation and delirium precaution ?-Continue thiamine, folic acid and multivitamin ?-Ativan as needed ?-Started clonidine patch on 4/28 mainly for hypertension ? ?Sinus tachycardia ?Multifactorial including acute infection, dehydration  ?-Antihypertensive meds as above ?-IV fluid ? ?Dehydration ?IV fluid as above. ? ?Normocytic anemia ?Recent Labs  ?  10/28/21 ?0302 10/29/21 ?0336 10/29/21 ?1755 10/30/21 ?0263 10/31/21 ?0330 10/31/21 ?1858 11/01/21 ?0236 11/02/21 ?0500 11/04/21 ?0600 11/06/21 ?7858  ?HGB 7.8* 6.6* 9.3* 8.8* 8.7* 8.1* 8.2* 8.2* 8.5* 8.2*  ?H&H relatively stable. ?Continue monitoring ? ? ?Right proximal humeral fracture ?Continue nonweightbearing on right arm ?-Outpatient follow-up with orthopedic surgery ? ?Elevated brain natriuretic peptide (BNP) level ?Due to sepsis?  TTE basically normal but not great quality.  Does not appear fluid overloaded.  Probably dehydrated. ? ?Hypernatremia, hyponatremia, hypokalemia, hyperphosphatemia ?Sodium up to 152. ?-Changed IV D5 half-normal saline to IV dextrose ?-Recheck in the morning ? ?AKI (acute kidney injury) ([place]) ?Recent Labs  ?  10/30/21 ?8502 10/31/21 ?0330 10/31/21 ?1459 10/31/21 ?1858 11/01/21 ?0236 11/01/21 ?1509 11/02/21 ?0500 11/03/21 ?0600 11/05/21 ?0841 11/06/21 ?7741  ?BUN 66* 75* 75* 76* 84*  82* 78* 78* 85* 84*  ?CREATININE 2.02* 2.02* 1.98* 2.01* 1.93* 2.09* 1.88* 1.95* 1.95* 1.82*  ?Unknown baseline. Her Cr was in the range of 0.5-0.6 in 2019.  No interval value. ?-Continue monitoring ?-Avoid nephrotoxic meds ?-IV dextrose as above ? ? ?Protein-calorie malnutrition, severe ([place]) ?Nutrition Status: ?Nutrition Problem: Moderate Malnutrition ?Etiology: chronic illness (alcohol abuse) ?Signs/Symptoms: moderate fat depletion, moderate muscle depletion, severe muscle depletion ?Interventions: TPN ? ?Uncontrolled hypertension ?Blood pressure within acceptable range. ?-Added clonidine patch 0.1 mg on 4/27 ?-Continue IV metoprolol 5 mg every 6 hours ?-IV labetalol to 20 mg every 2 hours as needed ? ?Tobacco use disorder ?Encourage cessation when able to comprehend. ?-Continue nicotine patch ? ?Chronic pain ?Does not appear to be in pain.   ?-As needed fentanyl for pain control-reduced dose ? ? ?Thrombocytopenia ([place]) ?Likely due to acute illness and alcohol use disorder.  Resolved. ? ? ? ? ?Pressure skin injury ?Pressure Injury 10/27/21 Vertebral column Mid;Medial Deep Tissue Pressure Injury - Purple or maroon localized area of discolored intact skin or blood-filled blister due to damage of underlying soft tissue from pressure and/or shear. (Active)  ?10/27/21 0428  ?Location: Vertebral column  ?Location Orientation: Mid;Medial  ?Staging: Deep Tissue Pressure Injury - Purple or maroon localized area of discolored intact skin or blood-filled blister due to damage of underlying soft tissue from pressure and/or shear.  ?Wound Description (Comments):   ?  Present on Admission: No  ?Dressing Type Foam - Lift dressing to assess site every shift 11/06/21 0800  ? ?DVT prophylaxis:  ?heparin injection 5,000 Units Start: 10/30/21 1400 ?Place and maintain sequential compression device Start: 10/25/21 0954 ?SCDs Start: 10/22/21 1429 ? ?Code Status: Full code ?Family Communication: Patient and/or RN. Available if any  question.  ?Level of care: Stepdown ?Status is: Inpatient ?Remains inpatient appropriate because: Severe sepsis in the setting of peritonitis requiring IV antibiotics and TPN, safe disposition/SNF ? ? ?Final disposition:

## 2021-11-06 NOTE — Progress Notes (Signed)
15 Days Post-Op  ? ?Subjective/Chief Complaint: ?Pt sleepy but wakes to voice  ? ? ?Objective: ?Vital signs in last 24 hours: ?Temp:  [97.4 ?F (36.3 ?C)-99.3 ?F (37.4 ?C)] 98.3 ?F (36.8 ?C) (04/30 0734) ?Pulse Rate:  [101-117] 106 (04/30 0700) ?Resp:  [19-29] 23 (04/30 0700) ?BP: (126-161)/(69-103) 149/81 (04/30 0700) ?SpO2:  [93 %-100 %] 95 % (04/30 0815) ?Weight:  [54.2 kg] 54.2 kg (04/30 0500) ?Last BM Date : 11/05/21 ? ?Intake/Output from previous day: ?04/29 0701 - 04/30 0700 ?In: 1529.9 [I.V.:1362.4; IV Piggyback:72.5] ?Out: 2925 [LKGMW:1027; Drains:1100; OZDGU:440] ?Intake/Output this shift: ?No intake/output data recorded. ? ? ?General: more somnolent  ?Heart: sinus tachycardia  ?Lungs: Respiratory effort nonlabored ?Abd: At least mildly distended but soft. Difficult to assess tenderness but shakes her head no when palpating and asking if she has any tenderness. No grimacing.  No rigidity or guarding.  Gastrostomy in place with bilious drainage in canister. Drain in LLQ with purulent-feculent looking material. Stoma pink budded and viable. Ostomy bag w/ some thin liquid stool. Dressing in place with more greenish drainage / feculent changes  ? ? ? ?Lab Results:  ?Recent Labs  ?  11/04/21 ?0600 11/06/21 ?3474  ?WBC 6.3 9.6  ?HGB 8.5* 8.2*  ?HCT 26.0* 25.4*  ?PLT 187 283  ? ?BMET ?Recent Labs  ?  11/05/21 ?0841 11/06/21 ?2595  ?NA 148* 152*  ?K 3.9 3.5  ?CL 118* 118*  ?CO2 23 26  ?GLUCOSE 126* 135*  ?BUN 85* 84*  ?CREATININE 1.95* 1.82*  ?CALCIUM 8.6* 8.9  ? ?PT/INR ?No results for input(s): LABPROT, INR in the last 72 hours. ?ABG ?No results for input(s): PHART, HCO3 in the last 72 hours. ? ?Invalid input(s): PCO2, PO2 ? ?Studies/Results: ?CT ABDOMEN PELVIS WO CONTRAST ? ?Result Date: 11/04/2021 ?CLINICAL DATA:  Postop exploratory laparotomy and Hartmann's pouch procedure. EXAM: CT ABDOMEN AND PELVIS WITHOUT CONTRAST TECHNIQUE: Multidetector CT imaging of the abdomen and pelvis was performed following the  standard protocol without IV contrast. RADIATION DOSE REDUCTION: This exam was performed according to the departmental dose-optimization program which includes automated exposure control, adjustment of the mA and/or kV according to patient size and/or use of iterative reconstruction technique. COMPARISON:  10/28/2021 FINDINGS: Lower chest: Small left pleural effusion with left lower lobe airspace consolidation/atelectasis. Hepatobiliary: No focal liver abnormality identified. Gallstone measures 5 mm. Mild gallbladder wall edema is identified, image 31/2. No bile duct dilatation. Pancreas: Unremarkable. No pancreatic ductal dilatation or surrounding inflammatory changes. Spleen: Normal in size without focal abnormality. Adrenals/Urinary Tract: Normal adrenal glands. No kidney mass, nephrolithiasis or hydronephrosis. Small foci of gas noted within the bladder. Bladder otherwise unremarkable. Stomach/Bowel: Gastrostomy tube is identified within the stomach. Evaluation of bowel pathology is significantly limited due to lack of IV contrast material and lack of enteric contrast material within the pelvic bowel loops. The small bowel loops are upper limits of normal in caliber measuring 3 cm which may reflect postoperative ileus. This is improved when compared with 10/28/2021 when the small bowel loops measured up to 5.1 cm. There is a left lower quadrant colostomy. Hartmann's procedure has been performed. Vascular/Lymphatic: Aortic atherosclerosis. No aneurysm. Within the limitations of unenhanced technique no abdominopelvic adenopathy identified. Reproductive: No mass identified. Other: There is a percutaneous pigtail drainage catheter which enters from a left lower quadrant approach with pigtail terminating in the ventral pelvis just right of the midline, image 68/2. The margins of the previously noted anterior pelvic fluid collection are difficult to visualize separate from  unopacified bowel loops. Within this  limitation there appears to be decreased volume of the previously noted fluid within the ventral pelvis. The surgically placed JP drain within the posterior pelvis has been removed. Within the cul-de-sac, there is a fluid collection measuring 6.6 x 3.4 cm, image 68/2. Maturity of this fluid collection and suitability for drainage is difficult to assess due to lack of IV contrast. No additional suspicious or new fluid collections identified within the abdomen or pelvis. A few foci of intraperitoneal gas are identified, likely reflecting post up change. Musculoskeletal: No acute or significant osseous findings. Unchanged compression fracture at the L2 level. Severe degenerative disc disease noted at L3-4. IMPRESSION: 1. Evaluation of bowel pathology is significantly limited due to lack of IV contrast material and lack of enteric contrast material within the pelvic bowel loops. 2. There is a new fluid collection within the cul-de-sac measuring 6.6 x 3.4 cm. Maturity of this fluid collection and suitability for drainage is difficult to assess due to lack of IV contrast. 3. Unchanged position of percutaneous pigtail drainage catheter which terminates in the right ventral pelvis. Previously noted fluid collection within the anterior pelvis is difficult to reassess due to factors described above. Within this limitation it appears that there has been decrease in the volume ventral fluid collection however. This is difficult to quantify however due to lack of IV contrast and presence of unopacified bowel loops in the ventral pelvis/lower abdomen. 4. Improved appearance of small bowel distension consistent with resolving small-bowel ileus 5. Small left pleural effusion with left lower lobe airspace consolidation/atelectasis. 6. Gallstone with mild gallbladder wall edema. 7. Aortic Atherosclerosis (ICD10-I70.0). Electronically Signed   By: Kerby Moors M.D.   On: 11/04/2021 11:36   ? ?Anti-infectives: ?Anti-infectives (From  admission, onward)  ? ? Start     Dose/Rate Route Frequency Ordered Stop  ? 11/04/21 1000  piperacillin-tazobactam (ZOSYN) IVPB 3.375 g       ? 3.375 g ?12.5 mL/hr over 240 Minutes Intravenous Every 8 hours 11/04/21 0935 11/11/21 1759  ? 10/27/21 1600  cefTRIAXone (ROCEPHIN) 2 g in sodium chloride 0.9 % 100 mL IVPB  Status:  Discontinued       ? 2 g ?200 mL/hr over 30 Minutes Intravenous Every 24 hours 10/27/21 0843 10/27/21 1106  ? 10/27/21 1600  piperacillin-tazobactam (ZOSYN) IVPB 3.375 g       ? 3.375 g ?12.5 mL/hr over 240 Minutes Intravenous Every 8 hours 10/27/21 1106 11/03/21 2000  ? 10/27/21 1400  metroNIDAZOLE (FLAGYL) IVPB 500 mg  Status:  Discontinued       ? 500 mg ?100 mL/hr over 60 Minutes Intravenous Every 12 hours 10/27/21 0843 10/27/21 1106  ? 10/24/21 1600  piperacillin-tazobactam (ZOSYN) IVPB 3.375 g  Status:  Discontinued       ? 3.375 g ?12.5 mL/hr over 240 Minutes Intravenous Every 8 hours 10/24/21 1422 10/27/21 0843  ? 10/24/21 1000  cefTRIAXone (ROCEPHIN) 2 g in sodium chloride 0.9 % 100 mL IVPB  Status:  Discontinued       ? 2 g ?200 mL/hr over 30 Minutes Intravenous Every 24 hours 10/24/21 0747 10/24/21 1357  ? 10/23/21 1530  vancomycin (VANCOCIN) IVPB 1000 mg/200 mL premix       ? 1,000 mg ?200 mL/hr over 60 Minutes Intravenous  Once 10/23/21 1439 10/23/21 1547  ? 10/23/21 0200  ceFEPIme (MAXIPIME) 2 g in sodium chloride 0.9 % 100 mL IVPB  Status:  Discontinued       ?  2 g ?200 mL/hr over 30 Minutes Intravenous Every 12 hours 10/22/21 2032 10/24/21 0747  ? 10/22/21 2200  cefOXitin (MEFOXIN) 2 g in sodium chloride 0.9 % 100 mL IVPB  Status:  Discontinued       ? 2 g ?200 mL/hr over 30 Minutes Intravenous Every 8 hours 10/22/21 1706 10/22/21 2032  ? 10/22/21 1956  vancomycin variable dose per unstable renal function (pharmacist dosing)  Status:  Discontinued       ?  Does not apply See admin instructions 10/22/21 1957 10/23/21 1917  ? 10/22/21 1245  vancomycin (VANCOCIN) IVPB 1000 mg/200 mL  premix       ? 1,000 mg ?200 mL/hr over 60 Minutes Intravenous  Once 10/22/21 1241 10/22/21 1402  ? 10/22/21 1245  ceFEPIme (MAXIPIME) 2 g in sodium chloride 0.9 % 100 mL IVPB       ? 2 g ?200 mL/hr over 30 Minu

## 2021-11-06 NOTE — Progress Notes (Signed)
PHARMACY - TOTAL PARENTERAL NUTRITION CONSULT NOTE  ? ?Indication: Prolonged ileus ? ?Patient Measurements: ?Height: 5' 10.5" (179.1 cm) ?Weight: 54.2 kg (119 lb 7.8 oz) ?IBW/kg (Calculated) : 69.65 ?TPN AdjBW (KG): 53.8 ?Body mass index is 16.9 kg/m?. ?Usual Weight: 53.8 kg ? ?Assessment: 61 yo F w/ PMH etoh use disorder, smoker, HTN, GERD ?s/p Exp Lap Hartmans 10/22/2021 for perforated sigmoid colon, Dr. Marcello Moores  ? ?Glucose / Insulin: no hx DM.  CBGs <150.  Minimal SSI use ?Electrolytes: Na 152 up again (removed Na 4/24), K 3.5 down (goal K >=4), Mag 2.3 stable (goal >=2), Cl 18 ?Renal: SCr 1.82 improved, BUN 84  ?Hepatic: LFTs improved to WNL; T bili down to 1.1 ?Intake / Output;  UOP 2.15 L last 24 hr down, Drains 500 mL/day, 154m Stool output charted ?Net I/O yesterday: -1295 mL ?MIVF:  D51/2NS 4/29 - changed to D5W 4/30 due to increasing Na ?GI Imaging:  ?4/15 CTA: bowel perforation ?4/17 KUB ileus or pSBO ?4/20 CTA: severe ileus or pSBO ? ?GI Surgeries / Procedures:  ?10/22/21 s/p EXPLORATORY LAPAROTOMY hartmans for perforated sigmoid colon, Dr. TMarcello Moores ?4/23 IR placed drain LLQ ?Central access: CVC TL placed 10/23/21 ?TPN start date: 10/26/21 ? ?Nutritional Goals: ?Goal TPN rate is 80 mL/hr (provides ~ 111g of protein and ~ 2001 kcals per day)  ? ?RD Assessment: ?Estimated Needs ?Total Energy Estimated Needs: 1900-2150 kcal ?Total Protein Estimated Needs: 110-125 grams ?Total Fluid Estimated Needs: >/= 3 L/day ? ?Current Nutrition:  ?TPN @ 80 ml/hr  ?NPO  ?Starting NG clamping trial on 4/26 ? ?Plan:  ?Now: KCL 148m IV x 4 doses ? ?At 1800: Continue TPN @  80 mL/hr to provide 100% of goals ?Electrolytes in TPN: Na 0 mEq/L, K 70 mEq/L, Ca 33m19mL, Mg 33mE72m, and Phos 8mmo333m. Cl:Ac max Ac ?Continue standard MVI and trace elements to TPN ?Continue folic acid & thiamine in TPN ?Continue Sensitive SSI q8 - monitor with start of D5W per Md ?Monitor TPN labs on Mon/Thurs ?Per Md, IVF's changed to D5W at 100ml/40mue to  continued rise in Na ? ? ?JustinAdrian SaranmD, BCPS ?Secure Chat if ?s ?11/06/2021 10:19 AM ? ? ?

## 2021-11-07 ENCOUNTER — Inpatient Hospital Stay (HOSPITAL_COMMUNITY): Payer: Self-pay

## 2021-11-07 DIAGNOSIS — R531 Weakness: Secondary | ICD-10-CM

## 2021-11-07 DIAGNOSIS — Z7189 Other specified counseling: Secondary | ICD-10-CM

## 2021-11-07 DIAGNOSIS — Z515 Encounter for palliative care: Secondary | ICD-10-CM

## 2021-11-07 DIAGNOSIS — E440 Moderate protein-calorie malnutrition: Secondary | ICD-10-CM | POA: Insufficient documentation

## 2021-11-07 LAB — COMPREHENSIVE METABOLIC PANEL
ALT: 22 U/L (ref 0–44)
ALT: 23 U/L (ref 0–44)
AST: 22 U/L (ref 15–41)
AST: 20 U/L (ref 15–41)
Albumin: 2.1 g/dL — ABNORMAL LOW (ref 3.5–5.0)
Albumin: 2.2 g/dL — ABNORMAL LOW (ref 3.5–5.0)
Alkaline Phosphatase: 98 U/L (ref 38–126)
Alkaline Phosphatase: 96 U/L (ref 38–126)
Anion gap: 6 (ref 5–15)
Anion gap: 7 (ref 5–15)
BUN: 74 mg/dL — ABNORMAL HIGH (ref 6–20)
BUN: 73 mg/dL — ABNORMAL HIGH (ref 6–20)
CO2: 23 mmol/L (ref 22–32)
CO2: 26 mmol/L (ref 22–32)
Calcium: 8.4 mg/dL — ABNORMAL LOW (ref 8.9–10.3)
Calcium: 8.3 mg/dL — ABNORMAL LOW (ref 8.9–10.3)
Chloride: 111 mmol/L (ref 98–111)
Chloride: 109 mmol/L (ref 98–111)
Creatinine, Ser: 1.72 mg/dL — ABNORMAL HIGH (ref 0.44–1.00)
Creatinine, Ser: 1.67 mg/dL — ABNORMAL HIGH (ref 0.44–1.00)
GFR, Estimated: 34 mL/min — ABNORMAL LOW (ref >60)
GFR, Estimated: 35 mL/min — ABNORMAL LOW (ref >60)
Glucose, Bld: 108 mg/dL — ABNORMAL HIGH (ref 70–99)
Glucose, Bld: 154 mg/dL — ABNORMAL HIGH (ref 70–99)
Potassium: 4.3 mmol/L (ref 3.5–5.1)
Potassium: 4.5 mmol/L (ref 3.5–5.1)
Sodium: 140 mmol/L (ref 135–145)
Sodium: 142 mmol/L (ref 135–145)
Total Bilirubin: 0.9 mg/dL (ref 0.3–1.2)
Total Bilirubin: 0.9 mg/dL (ref 0.3–1.2)
Total Protein: 6.7 g/dL (ref 6.5–8.1)
Total Protein: 6.8 g/dL (ref 6.5–8.1)

## 2021-11-07 LAB — CBC
HCT: 23.5 % — ABNORMAL LOW (ref 36.0–46.0)
HCT: 26.8 % — ABNORMAL LOW (ref 36.0–46.0)
Hemoglobin: 7.4 g/dL — ABNORMAL LOW (ref 12.0–15.0)
Hemoglobin: 8.2 g/dL — ABNORMAL LOW (ref 12.0–15.0)
MCH: 30.7 pg (ref 26.0–34.0)
MCH: 29 pg (ref 26.0–34.0)
MCHC: 31.5 g/dL (ref 30.0–36.0)
MCHC: 30.6 g/dL (ref 30.0–36.0)
MCV: 97.5 fL (ref 80.0–100.0)
MCV: 94.7 fL (ref 80.0–100.0)
Platelets: 312 10*3/uL (ref 150–400)
Platelets: 386 10*3/uL (ref 150–400)
RBC: 2.41 MIL/uL — ABNORMAL LOW (ref 3.87–5.11)
RBC: 2.83 MIL/uL — ABNORMAL LOW (ref 3.87–5.11)
RDW: 16.7 % — ABNORMAL HIGH (ref 11.5–15.5)
RDW: 15.9 % — ABNORMAL HIGH (ref 11.5–15.5)
WBC: 9.6 10*3/uL (ref 4.0–10.5)
WBC: 13.5 10*3/uL — ABNORMAL HIGH (ref 4.0–10.5)
nRBC: 0 % (ref 0.0–0.2)
nRBC: 0 % (ref 0.0–0.2)

## 2021-11-07 LAB — MAGNESIUM: Magnesium: 1.9 mg/dL (ref 1.7–2.4)

## 2021-11-07 LAB — LACTIC ACID, PLASMA
Lactic Acid, Venous: 0.7 mmol/L (ref 0.5–1.9)
Lactic Acid, Venous: 0.9 mmol/L (ref 0.5–1.9)

## 2021-11-07 LAB — BLOOD GAS, ARTERIAL
Acid-base deficit: 3.4 mmol/L — ABNORMAL HIGH (ref 0.0–2.0)
Acid-base deficit: 1.7 mmol/L (ref 0.0–2.0)
Bicarbonate: 23.6 mmol/L (ref 20.0–28.0)
Bicarbonate: 25.2 mmol/L (ref 20.0–28.0)
Drawn by: 29503
Drawn by: 59133
FIO2: 100 %
MECHVT: 550 mL
O2 Content: 40 L/min
O2 Saturation: 84.5 %
O2 Saturation: 97.2 %
PEEP: 5 cmH2O
Patient temperature: 37
Patient temperature: 34.7
RATE: 18 resp/min
pCO2 arterial: 49 mmHg — ABNORMAL HIGH (ref 32–48)
pCO2 arterial: 45 mmHg (ref 32–48)
pH, Arterial: 7.29 — ABNORMAL LOW (ref 7.35–7.45)
pH, Arterial: 7.34 — ABNORMAL LOW (ref 7.35–7.45)
pO2, Arterial: 52 mmHg — ABNORMAL LOW (ref 83–108)
pO2, Arterial: 92 mmHg (ref 83–108)

## 2021-11-07 LAB — GLUCOSE, CAPILLARY
Glucose-Capillary: 115 mg/dL — ABNORMAL HIGH (ref 70–99)
Glucose-Capillary: 128 mg/dL — ABNORMAL HIGH (ref 70–99)
Glucose-Capillary: 130 mg/dL — ABNORMAL HIGH (ref 70–99)
Glucose-Capillary: 134 mg/dL — ABNORMAL HIGH (ref 70–99)

## 2021-11-07 LAB — TROPONIN I (HIGH SENSITIVITY): Troponin I (High Sensitivity): 13 ng/L (ref <18)

## 2021-11-07 LAB — BRAIN NATRIURETIC PEPTIDE: B Natriuretic Peptide: 173.1 pg/mL — ABNORMAL HIGH (ref 0.0–100.0)

## 2021-11-07 LAB — PHOSPHORUS: Phosphorus: 4.3 mg/dL (ref 2.5–4.6)

## 2021-11-07 LAB — TRIGLYCERIDES: Triglycerides: 108 mg/dL (ref <150)

## 2021-11-07 MED ORDER — ORAL CARE MOUTH RINSE
15.0000 mL | OROMUCOSAL | Status: DC
  Administered 2021-11-07 – 2021-11-09 (×15): 15 mL via OROMUCOSAL

## 2021-11-07 MED ORDER — FENTANYL CITRATE PF 50 MCG/ML IJ SOSY
PREFILLED_SYRINGE | INTRAMUSCULAR | Status: AC
  Administered 2021-11-07: 50 ug via INTRAVENOUS
  Filled 2021-11-07: qty 2

## 2021-11-07 MED ORDER — SUCCINYLCHOLINE CHLORIDE 200 MG/10ML IV SOSY
PREFILLED_SYRINGE | INTRAVENOUS | Status: AC
  Filled 2021-11-07: qty 10

## 2021-11-07 MED ORDER — ROCURONIUM BROMIDE 10 MG/ML (PF) SYRINGE
PREFILLED_SYRINGE | INTRAVENOUS | Status: AC
  Administered 2021-11-07: 50 mg
  Filled 2021-11-07: qty 10

## 2021-11-07 MED ORDER — FENTANYL CITRATE PF 50 MCG/ML IJ SOSY
50.0000 ug | PREFILLED_SYRINGE | INTRAMUSCULAR | Status: DC | PRN
  Administered 2021-11-08: 50 ug via INTRAVENOUS

## 2021-11-07 MED ORDER — LACTATED RINGERS IV BOLUS
1000.0000 mL | Freq: Once | INTRAVENOUS | Status: AC
  Administered 2021-11-07: 1000 mL via INTRAVENOUS

## 2021-11-07 MED ORDER — FENTANYL CITRATE PF 50 MCG/ML IJ SOSY
50.0000 ug | PREFILLED_SYRINGE | INTRAMUSCULAR | Status: AC | PRN
  Administered 2021-11-07 – 2021-11-08 (×2): 50 ug via INTRAVENOUS

## 2021-11-07 MED ORDER — CHLORHEXIDINE GLUCONATE 0.12% ORAL RINSE (MEDLINE KIT)
15.0000 mL | Freq: Two times a day (BID) | OROMUCOSAL | Status: DC
  Administered 2021-11-07 – 2021-11-09 (×4): 15 mL via OROMUCOSAL

## 2021-11-07 MED ORDER — KETAMINE HCL 50 MG/5ML IJ SOSY
PREFILLED_SYRINGE | INTRAMUSCULAR | Status: AC
  Filled 2021-11-07: qty 5

## 2021-11-07 MED ORDER — POLYETHYLENE GLYCOL 3350 17 G PO PACK: 17.0000 g | PACK | Freq: Every day | ORAL | Status: DC

## 2021-11-07 MED ORDER — ETOMIDATE 2 MG/ML IV SOLN
INTRAVENOUS | Status: AC
  Administered 2021-11-07: 20 mg
  Filled 2021-11-07: qty 20

## 2021-11-07 MED ORDER — TRAVASOL 10 % IV SOLN
INTRAVENOUS | Status: AC
  Filled 2021-11-07: qty 1113.6

## 2021-11-07 MED ORDER — MIDAZOLAM HCL 2 MG/2ML IJ SOLN
2.0000 mg | INTRAMUSCULAR | Status: DC | PRN
  Administered 2021-11-07: 2 mg via INTRAVENOUS
  Filled 2021-11-07: qty 2

## 2021-11-07 MED ORDER — SODIUM BICARBONATE 8.4 % IV SOLN
100.0000 meq | Freq: Once | INTRAVENOUS | Status: AC
Start: 2021-11-07 — End: 2021-11-07

## 2021-11-07 MED ORDER — MIDAZOLAM HCL 2 MG/2ML IJ SOLN
INTRAMUSCULAR | Status: AC
  Administered 2021-11-07: 2 mg via INTRAVENOUS
  Filled 2021-11-07: qty 2

## 2021-11-07 MED ORDER — FENTANYL 2500MCG IN NS 250ML (10MCG/ML) PREMIX INFUSION
0.0000 ug/h | INTRAVENOUS | Status: DC
  Administered 2021-11-08: 25 ug/h via INTRAVENOUS
  Administered 2021-11-09: 50 ug/h via INTRAVENOUS
  Filled 2021-11-07 (×2): qty 250

## 2021-11-07 MED ORDER — FENTANYL CITRATE (PF) 100 MCG/2ML IJ SOLN
INTRAMUSCULAR | Status: AC
  Filled 2021-11-07: qty 2

## 2021-11-07 MED ORDER — PHENYLEPHRINE 80 MCG/ML (10ML) SYRINGE FOR IV PUSH (FOR BLOOD PRESSURE SUPPORT)
PREFILLED_SYRINGE | INTRAVENOUS | Status: AC
  Filled 2021-11-07: qty 10

## 2021-11-07 MED ORDER — MIDAZOLAM HCL 2 MG/2ML IJ SOLN: 2.0000 mg | INTRAMUSCULAR | Status: DC | PRN

## 2021-11-07 MED ORDER — NOREPINEPHRINE 4 MG/250ML-% IV SOLN
INTRAVENOUS | Status: AC
Start: 2021-11-07 — End: 2021-11-07
  Administered 2021-11-07: 12 ug/min via INTRAVENOUS
  Filled 2021-11-07: qty 250

## 2021-11-07 MED ORDER — DOCUSATE SODIUM 50 MG/5ML PO LIQD
100.0000 mg | Freq: Two times a day (BID) | ORAL | Status: DC
  Filled 2021-11-07: qty 10

## 2021-11-07 MED ORDER — SODIUM BICARBONATE 8.4 % IV SOLN
INTRAVENOUS | Status: AC
  Administered 2021-11-07: 100 meq via INTRAVENOUS
  Filled 2021-11-07: qty 50

## 2021-11-07 MED ORDER — NOREPINEPHRINE 4 MG/250ML-% IV SOLN
0.0000 ug/min | INTRAVENOUS | Status: DC
  Administered 2021-11-08: 5 ug/min via INTRAVENOUS
  Administered 2021-11-08: 3 ug/min via INTRAVENOUS
  Filled 2021-11-07 (×2): qty 250

## 2021-11-07 NOTE — Progress Notes (Signed)
? ?NAME:  Jessica [name], MRN:  [id number], DOB:  1961-06-05, LOS: 41 ?ADMISSION DATE:  10/22/2021, CONSULTATION DATE:  10/22/21 ?REFERRING MD:  Francia Greaves, CHIEF COMPLAINT:  abdominal pain  ? ?History of Present Illness:  ?[name] with history of alcohol use disorder with prior severe withdrawal/DT's requiring ICU admission and precedex, smoking who presented to the ED 4/15 for severe diffuse abdominal pain that began the night before admission. She had had constipation for 4d PTA. Endorsed frequent alcohol use. Last drink was day PTA.  ? ?In ED she was found to have pneumoperitoneum and free fluid on CT A/P, severe lactic acidosis. She was started on broad spectrum antibiotics and has been given a total of 5L of crystalloid. Surgery was consulted and she was taken to OR where she underwent ex lap and was found to have perforated sigmoid colon, then had hartman's procedure and insertion of g-tube which was left to gravity drainage, JP bulb in pelvis. She was noted to have feculent peritonitis in the pelvis. ICU course complicated by klebsiella oxytoca bacteremia, profound protein calorie malnutrition requiring TPN, hypoxemic respiratory failure, tachycardia and failure to progress.  She was transferred to  ? ?Pertinent  Medical History  ?Etoh use disorder ?History of DT ?Smoking ? ?Significant Hospital Events: ?Including procedures, antibiotic start and stop dates in addition to other pertinent events   ?10/22/21 ex lap, hartman's procedure, g tube placement. Extubated in OR ?4/17 Reintubated overnight with continued pressor support x2. 11 liters positive thus far with progressive AKI this am  ?4/18 Continued issues with electrolyte abnormalities overnight, remains on levo and neo. She is now 14L positive but urine output has increased  ?4/19 No issues overnight, remains 14L positive but urine output continues to improve. Start to slowly diureses today  ?4/20 weaning NE, extubated, diuresed, started trickle TF, remains on  precedex for agitation  ?4/21 periods of agitation/ hallucinations, scheduled ativan helps, remains on precedex 0.7. CT A/P with suspicious for partial small bowel obstruction, with transition point in the lower central pelvis. Increased small to moderate amount of ascites, without definite focal abscess collection. Small amount of postop free air also noted. New small left pleural effusion and left lower lobe atelectasis. New diffuse body wall edema, consistent with anasarca. Cholelithiasis.  ?4/22 On  TPN.  Did not tolerated TF yesterday -vomitted and now G tube to LIS with bilios returns.  Low grade fever +. WBC now normal. On Zosyn. Hgb < 7g% without overt bleeding - 1 U PRBC. delirium worse.  ?4/24 Remains off vasopressors, overnight with agitation requiring precedex, 5 mg Haldol, 2 mg versed ?4/25 Out of bed. SLP eval declined by patient as well as OT.  ?4/26 Off precedex. Seen by palliative care > pt requested full code. No family.  ?4/27 Transferred to [facility]  ?5/1 PCCM called back for AMS ? ?Interim History / Subjective:  ?Declining respiratory status. Requiring 15L O2 ?Tmax 99.2  ?I/O 1.6L UOP, 1.1L from drains, +775m in last 24 hours  ?Emergently called to [facility] and intubated - thick mucus that partially was suctioned and partially needed finger to remove ? ?Objective   ?Blood pressure (!) 156/73, pulse 95, temperature 99.2 ?F (37.3 ?C), temperature source Axillary, resp. rate (!) 35, height 5' 10.5" (1.791 m), weight 54 kg, SpO2 (!) 83 %. ?   ?   ? ?Intake/Output Summary (Last 24 hours) at 11/07/2021 1831 ?Last data filed at 11/07/2021 1826 ?Gross per 24 hour  ?Intake 4495.05 ml  ?Output 2732 ml  ?  Net 1763.05 ml  ? ?Filed Weights  ? 11/05/21 0448 11/06/21 0500 11/07/21 0500  ?Weight: 53.1 kg 54.2 kg 54 kg  ? ? ?Examination: ?General:  Frail lady  ?HEENT: MM pink/moist ?Neuro: Obtunded ?CV: s1s2 , no m/r/g ?PULM:  tachypnea ?GI: soft, bsx4 active  ?Extremities: warm/dry,   ?Skin: no rashes or  lesions ? ?Resolved Hospital Problem list   ?[place] ?Lactic acidosis  ?Hyponatremia  ?Hypocalcemia ?Transaminitis ?Hypernatremia  ? ?Assessment & Plan:  ? ?Severe Sepis due to feculent peritonitis with sigmoid perforation  ?Klebsiella Oxytoca Bacteremia  ?S/p hartman's procedure, g-tube placement 4/15. C/T AP 4/21 suspicious for partial small bowel obstruction, with transition point in the lower central pelvis. Increased small to moderate amount of ascites, without definite focal abscess collection. Small amount of postop free air also noted. New small left pleural effusion and left lower lobe atelectasis. New diffuse body wall edema, consistent with anasarca. Cholelithiasis.  ?-post operative care per [facility]  ?-drains per IR  ?-continue IV zosyn ? ?-TPN per [facility], initated 4/19  ?-WOC for colostomy care / surgical wounds  ?  ?Acute Hypoxic Respiratory Failure  ?Secondary to above, re-intubated overnight 4/16, Extubated 4/20.  Increased O2 needs, AMS requiring reintubation 5/1. ? ?Plan ?-PRVC 8cc/kg  ?-ABG in one hour  ?-CXR post intubation and in am  ?-wean PEEP / FiO2 for sats >90% ? ?HTN ?Tachycardia  ?-tele monitoring  ?-lopressor '5mg'$  IV Q6, clonidine patch > may need to hold with sedation  ?-PRN labetolol IV in place ? ?Acute Encephalopathy, multifactorial with ICU delirium, ongoing sepsis, ETOH withdrawal (should be out of window at this point)  ?Alcohol use and History of DT ?Acute Pain  ?Has needed precedex before in ICU. ?-PAD protocol while on vent  ?-PRN ativan  ?-TPN per pharmacy  ? ?AKI ?Hypernatremia  ?Hypokalemia  ?-Trend BMP / urinary output ?-Replace electrolytes as indicated ?-Avoid nephrotoxic agents, ensure adequate renal perfusion ? ?Thrombocytopenia  ?Normocytic Anemia  ?Likely 2/2 to septic shock.  No evidence of bleeding/ clotting. Has been on SCDs only for VTEx.   Monitor closely.  Zosyn may be contributing.  ?Normocytic anemia ?-transfuse for Hgb <7% or active bleeding  ?-follow CBC ? ?Cachexia -  Prior to & Present on Admit ?Severe Protein Calorie Malnuritioin - (ablg 2,7 at admit) - moderate Prior to & Present on Admit ?-TPN per pharmacy  ?-defer enteral nutrition to [facility] ? ?Right proximal humerus fracture, pta  ?S/p fall in March 2023. Seen by ortho 09/26/21 with plans for non weightbearing of RUE x1 month and then f/u with ortho in one month.  ?-outpatient ortho follow up  ?  ? ?Best Practice (right click and "Reselect all SmartList Selections" daily)  ?Diet/type: NPO TPN, trickle TF per surgery- do not advance  ?DVT prophylaxis: prophylactic heparin  ?GI prophylaxis: PPI ?Lines: Central line on TPN ?Foley:  N/A ?Code Status:  full code ?Last date of multidisciplinary goals of care discussion: Continue to update family daily. No family other than boyfriend identified. No next of kin.   ? ? ? ? ? ?[place]  ? ?The patient HILMA STEINHILBER is critically ill with multiple organ systems failure and requires high complexity decision making for assessment and support, frequent evaluation and titration of therapies, application of advanced monitoring technologies and extensive interpretation of multiple databases.  ? ?Critical Care Time devoted to patient care services described in this note is  35  Minutes. This time reflects time of care of this  signee Dr Brand Males. This critical care time does not reflect procedure time, or teaching time or supervisory time of PA/NP/Med student/Med Resident etc but could involve care discussion time  ? ? ? ?Dr. Brand Males, M.D., F.C.C.P ?Pulmonary and Critical Care Medicine ?Medical Director - [facility] ICU ?Staff Physician, [place] ?[facility] ?Pager: [phone], If no answer or between  15:00h - 7:00h: call [phone] ? ?11/07/2021 ?6:56 PM ? ? ? ?LABS  ? ? ?PULMONARY ?Recent Labs  ?Lab 11/07/21 ?1835  ?PHART 7.29*  ?PCO2ART 49*  ?PO2ART 52*  ?HCO3 23.6  ?O2SAT 84.5  ? ? ?CBC ?Recent Labs  ?Lab  11/06/21 ?0258 11/07/21 ?0500 11/07/21 ?1832  ?HGB 8.2* 7.4* 8.2*  ?HCT 25.4* 23.5* 26.8*  ?WBC 9.6 9.6 13.5*  ?PLT 283 312 386  ? ? ?COAGULATION ?No results for input(s): INR in the last 168 hours. ? ?CARDIAC ? No results

## 2021-11-07 NOTE — Progress Notes (Signed)
PHARMACY - TOTAL PARENTERAL NUTRITION CONSULT NOTE  ? ?Indication: Prolonged ileus ? ?Patient Measurements: ?Height: 5' 10.5" (179.1 cm) ?Weight: 54 kg (119 lb 0.8 oz) ?IBW/kg (Calculated) : 69.65 ?TPN AdjBW (KG): 53.8 ?Body mass index is 16.84 kg/m?. ?Usual Weight: 53.8 kg ? ?Assessment: 61 yo F w/ PMH etoh use disorder, smoker, HTN, GERD ?s/p Exp Lap Hartmans 10/22/2021 for perforated sigmoid colon, Dr. Marcello Moores  ? ?Glucose / Insulin: no hx DM.  CBGs continue to be <150.  Minimal SSI use ?Electrolytes: Na improved down to 140 (removed Na 4/24), K 4.3 improved (goal K >=4), Mag 1.9 stable (goal >=2), Cl 111 improved ?Renal: SCr 1.72 improved, BUN 74 down ?Hepatic: LFTs improved to WNL; T bili down to 0.9, TG 108 (note they were drawn earlier this AM and level was 628, would assume this was drawn from TPN line, can check another TG if warranted in next few days ?Intake / Output;  UOP 1.2 L last 24 hr down, Drains 1572 mL/day, 140m Stool output charted ?Net I/O yesterday: +3848 mL ?MIVF:  D51/2NS 4/29 - changed to D5W 4/30 due to increasing Na - Na now improved 5/1 Md d/c'd D5W but did give LR bolus bc pt still dry per discussion ?GI Imaging:  ?4/15 CTA: bowel perforation ?4/17 KUB ileus or pSBO ?4/20 CTA: severe ileus or pSBO ? ?GI Surgeries / Procedures:  ?10/22/21 s/p EXPLORATORY LAPAROTOMY hartmans for perforated sigmoid colon, Dr. TMarcello Moores ?4/23 IR placed drain LLQ ?Central access: CVC TL placed 10/23/21 ?TPN start date: 10/26/21 ? ?Nutritional Goals: ?Goal TPN rate is 80 mL/hr (provides ~ 111g of protein and ~ 2001 kcals per day)  ? ?RD Assessment: ?Estimated Needs ?Total Energy Estimated Needs: 1900-2150 kcal ?Total Protein Estimated Needs: 110-125 grams ?Total Fluid Estimated Needs: >/= 3 L/day ? ?Current Nutrition:  ?TPN @ 80 ml/hr  ?NPO  ?Starting NG clamping trial on 4/26 ? ?Plan:  ?Continue TPN @  80 mL/hr to provide 100% of goals ?Electrolytes in TPN: Na 0 mEq/L, K 70 mEq/L, Ca 525m/L, Mg 60m12mL, and Phos  8mm54mL. Cl:Ac max Ac ?Continue standard MVI and trace elements to TPN ?Continue folic acid & thiamine in TPN ?Discontinue CBG/SSI as patient has been stable for at least a week ?Monitor TPN labs on Mon/Thurs ? ? ?JustAdrian SaranarmD, BCPS ?Secure Chat if ?s ?11/07/2021 10:44 AM ? ? ?

## 2021-11-07 NOTE — Progress Notes (Signed)
eLink Physician-Brief Progress Note ?Patient Name: Jessica [name] ?DOB: August 30, 1960 ?MRN: [id number] ? ? ?Date of Service ? 11/07/2021  ?HPI/Events of Note ? Patient with sub-optimal sedation on the ventilator.  ?eICU Interventions ? Fentanyl gtt ordered.  ? ? ? ?  ? ?Frederik Pear ?11/07/2021, 11:48 PM ?

## 2021-11-07 NOTE — Consult Note (Signed)
WOC Nurse Consult Note: ?Reason for Consult: Placement of Eakin pouch to midline abdominal wound ?Wound type: surgical ?Pressure Injury POA: Yes/No/NA ?Measurement: ?Wound bed: ?Drainage (amount, consistency, odor) appears to be feces draining from the inferior aspect of the wound ?Periwound: intac ?Dressing procedure/placement/frequency: ?Use SMALL Mickie Bail [phone], to pouch the midline abdominal wound. Use strips of barrier ring at the top and bottom of the wound, then place the pouch over it.  ? ?I left the pattern for the Eakin pouch at the bedside. ? ?[place] Nurse ostomy follow up ?I replaced the 2 piece, 2 3/4 inch pouching system, with a one piece flexible pouch, Kellie Simmering #725 and barrier ring.  Due to the proximity of the ostomy to the wound, there is not enough intact skin to make the pouches independent of one another.  The ostomy pouch slightly overlaps the Eakin pouch. ? ?I have requested the [place] to order more of the small Eakin pouches and the one piece ostomy pouches and barrier rings. ? ?The patient tolerated the care without undue distress. ? ?Val Riles, RN, MSN, CWOCN, CNS-BC, pager [phone]  ?

## 2021-11-07 NOTE — Progress Notes (Signed)
eLink Physician-Brief Progress Note ?Patient Name: Jessica [name] ?DOB: May 31, 1961 ?MRN: [id number] ? ? ?Date of Service ? 11/07/2021  ?HPI/Events of Note ? Patient needs an order for a Foley catheter.  ?eICU Interventions ? Foley catheter ordered.  ? ? ? ?  ? ?Frederik Pear ?11/07/2021, 10:23 PM ?

## 2021-11-07 NOTE — Progress Notes (Signed)
? ?Progress Note ? ?16 Days Post-Op  ?Subjective: ?Pt difficult to understand with tongue edema and scabbing. Nods yes to being in pain. Appears to have had increase in feculent looking drainage through the midline over the weekend.  ? ?Objective: ?Vital signs in last 24 hours: ?Temp:  [97.3 ?F (36.3 ?C)-99.4 ?F (37.4 ?C)] 97.8 ?F (36.6 ?C) (05/01 0445) ?Pulse Rate:  [96-118] 99 (05/01 0700) ?Resp:  [16-26] 24 (05/01 0700) ?BP: (123-162)/(58-106) 157/84 (05/01 0700) ?SpO2:  [92 %-99 %] 95 % (05/01 0700) ?Weight:  [54 kg] 54 kg (05/01 0500) ?Last BM Date : 11/06/21 ? ?Intake/Output from previous day: ?04/30 0701 - 05/01 0700 ?In: 4624.8 [I.V.:4171.6; IV Piggyback:363.2] ?Out: 2272 [Urine:1200; Drains:972; Stool:100] ?Intake/Output this shift: ?No intake/output data recorded. ? ?PE: ?General: Awake and alert  ?Heart: sinus tachycardia  ?Lungs: Respiratory effort nonlabored ?Abd: At least mildly distended but soft. Appropriate ttp.  Gastrostomy in place with bilious drainage in canister. Drain in LLQ with purulent-feculent looking material. Stoma pink budded and viable. Ostomy bag w/ some thin liquid stool. Feculent drainage noted in midline wound  ? ? ?Lab Results:  ?Recent Labs  ?  11/06/21 ?9030 11/07/21 ?0500  ?WBC 9.6 9.6  ?HGB 8.2* 7.4*  ?HCT 25.4* 23.5*  ?PLT 283 312  ? ?BMET ?Recent Labs  ?  11/05/21 ?0841 11/06/21 ?0923  ?NA 148* 152*  ?K 3.9 3.5  ?CL 118* 118*  ?CO2 23 26  ?GLUCOSE 126* 135*  ?BUN 85* 84*  ?CREATININE 1.95* 1.82*  ?CALCIUM 8.6* 8.9  ? ?PT/INR ?No results for input(s): LABPROT, INR in the last 72 hours. ?CMP  ?   ?Component Value Date/Time  ? NA 152 (H) 11/06/2021 0525  ? K 3.5 11/06/2021 0525  ? CL 118 (H) 11/06/2021 0525  ? CO2 26 11/06/2021 0525  ? GLUCOSE 135 (H) 11/06/2021 0525  ? BUN 84 (H) 11/06/2021 0525  ? CREATININE 1.82 (H) 11/06/2021 0525  ? CALCIUM 8.9 11/06/2021 0525  ? PROT 6.8 11/06/2021 0525  ? ALBUMIN 2.2 (L) 11/06/2021 0525  ? AST 26 11/06/2021 0525  ? ALT 26 11/06/2021  0525  ? ALKPHOS 100 11/06/2021 0525  ? BILITOT 1.1 11/06/2021 0525  ? GFRNONAA 31 (L) 11/06/2021 0525  ? GFRAA >60 12/12/2017 0433  ? ?Lipase  ?   ?Component Value Date/Time  ? LIPASE 27 10/22/2021 1141  ? ? ? ? ? ?Studies/Results: ?No results found. ? ?Anti-infectives: ?Anti-infectives (From admission, onward)  ? ? Start     Dose/Rate Route Frequency Ordered Stop  ? 11/04/21 1000  piperacillin-tazobactam (ZOSYN) IVPB 3.375 g       ? 3.375 g ?12.5 mL/hr over 240 Minutes Intravenous Every 8 hours 11/04/21 0935 11/11/21 1759  ? 10/27/21 1600  cefTRIAXone (ROCEPHIN) 2 g in sodium chloride 0.9 % 100 mL IVPB  Status:  Discontinued       ? 2 g ?200 mL/hr over 30 Minutes Intravenous Every 24 hours 10/27/21 0843 10/27/21 1106  ? 10/27/21 1600  piperacillin-tazobactam (ZOSYN) IVPB 3.375 g       ? 3.375 g ?12.5 mL/hr over 240 Minutes Intravenous Every 8 hours 10/27/21 1106 11/03/21 2000  ? 10/27/21 1400  metroNIDAZOLE (FLAGYL) IVPB 500 mg  Status:  Discontinued       ? 500 mg ?100 mL/hr over 60 Minutes Intravenous Every 12 hours 10/27/21 0843 10/27/21 1106  ? 10/24/21 1600  piperacillin-tazobactam (ZOSYN) IVPB 3.375 g  Status:  Discontinued       ? 3.375 g ?  12.5 mL/hr over 240 Minutes Intravenous Every 8 hours 10/24/21 1422 10/27/21 0843  ? 10/24/21 1000  cefTRIAXone (ROCEPHIN) 2 g in sodium chloride 0.9 % 100 mL IVPB  Status:  Discontinued       ? 2 g ?200 mL/hr over 30 Minutes Intravenous Every 24 hours 10/24/21 0747 10/24/21 1357  ? 10/23/21 1530  vancomycin (VANCOCIN) IVPB 1000 mg/200 mL premix       ? 1,000 mg ?200 mL/hr over 60 Minutes Intravenous  Once 10/23/21 1439 10/23/21 1547  ? 10/23/21 0200  ceFEPIme (MAXIPIME) 2 g in sodium chloride 0.9 % 100 mL IVPB  Status:  Discontinued       ? 2 g ?200 mL/hr over 30 Minutes Intravenous Every 12 hours 10/22/21 2032 10/24/21 0747  ? 10/22/21 2200  cefOXitin (MEFOXIN) 2 g in sodium chloride 0.9 % 100 mL IVPB  Status:  Discontinued       ? 2 g ?200 mL/hr over 30 Minutes  Intravenous Every 8 hours 10/22/21 1706 10/22/21 2032  ? 10/22/21 1956  vancomycin variable dose per unstable renal function (pharmacist dosing)  Status:  Discontinued       ?  Does not apply See admin instructions 10/22/21 1957 10/23/21 1917  ? 10/22/21 1245  vancomycin (VANCOCIN) IVPB 1000 mg/200 mL premix       ? 1,000 mg ?200 mL/hr over 60 Minutes Intravenous  Once 10/22/21 1241 10/22/21 1402  ? 10/22/21 1245  ceFEPIme (MAXIPIME) 2 g in sodium chloride 0.9 % 100 mL IVPB       ? 2 g ?200 mL/hr over 30 Minutes Intravenous  Once 10/22/21 1241 10/22/21 1321  ? 10/22/21 1230  metroNIDAZOLE (FLAGYL) IVPB 500 mg  Status:  Discontinued       ? 500 mg ?100 mL/hr over 60 Minutes Intravenous Every 12 hours 10/22/21 1222 10/24/21 1357  ? ?  ? ? ? ?Assessment/Plan ?POD#16 - PERFORATED SIGMOID COLON - status post EX LAP, HARTMAN'S PROCEDURE, Gastrostomy tube placement - 2/95/2841 Dr. Leighton Ruff ? OR FINDINGS: Purulent ascites throughout the abdomen and stool contamination in the pelvis due to necrotic perforated sigmoid colon ?- Status post drainage of LLQ fluid collection by IR - 4/22, Cx with pseudomonas ?- Midline wound with feculent appearing drainage - appears  to be consistent with EC vs CC fistula, will discuss placement of Eakin's with WOC today  ?- NPO cont TNA / ABX  ?- CT shows pelvic fluid collection IR asked to comment - no contrast so not clear if this can be drained  ?- G-tube to LIWS and TPN ?- WOCN following for colostomy care ?  ?  ?FEN - g-tube to LIWS, TPN for severe protein calorie malnutrition ?VTE - SCDs, SQH ?ID - zosyn 4/17 - 4/27; 4/28 >>  ?  ?- below per TRH -  ?Tongue edema - SLP following  ?Klebsiella oxytoca bacteremia - repeat blood cxs 4/24 negative  ?Protein calorie malnutrition - continue TPN for now ?AKI - Cr 1.82 yesterday, UOP good  ?HTN ?Tobacco use ?Etoh use w/ hx of DT's ?Hx R humerus fx - on xray 3/13. Has seen Dr. Sammuel Hines in the office on 3/20 w/ plans for non-op management, NWB x  1 month and reassessment   ? LOS: 16 days  ? ? ? ?Norm Parcel, PA-C ?[facility] ?11/07/2021, 7:44 AM ?Please see Amion for pager number during day hours 7:00am-4:30pm  ?

## 2021-11-07 NOTE — Progress Notes (Signed)
CPT not done at this time.  ?

## 2021-11-07 NOTE — Progress Notes (Signed)
? ?                                                                                                                                                     ?                                                   ?Daily Progress Note  ? ?Patient Name: Jessica [name]       Date: 11/07/2021 ?DOB: 1961-03-18  Age: 61 y.o. MRN#: [id number] ?Attending Physician: Mercy Riding, MD ?Primary Care Physician: Deland Pretty, MD ?Admit Date: 10/22/2021 ? ?Reason for Consultation/Follow-up: Establishing goals of care ? ?Subjective: ?Resting in bed, awakens and responds some, is not alert, has dried dark material on tongue, mouth is open, appears chronically ill and weak.  ? ?Length of Stay: 16 ? ?Current Medications: ?Scheduled Meds:  ? chlorhexidine  15 mL Mouth Rinse BID  ? Chlorhexidine Gluconate Cloth  6 each Topical Daily  ? cloNIDine  0.1 mg Transdermal Weekly  ? heparin injection (subcutaneous)  5,000 Units Subcutaneous Q8H  ? ipratropium-albuterol  3 mL Nebulization BID  ? mouth rinse  15 mL Mouth Rinse q12n4p  ? metoprolol tartrate  5 mg Intravenous Q6H  ? nicotine  14 mg Transdermal Daily  ? pantoprazole (PROTONIX) IV  40 mg Intravenous Q24H  ? sodium chloride flush  10-40 mL Intracatheter Q12H  ? sodium chloride flush  5 mL Intracatheter Q8H  ? ? ?Continuous Infusions: ? sodium chloride Stopped (11/06/21 1949)  ? piperacillin-tazobactam (ZOSYN)  IV 3.375 g (11/07/21 1042)  ? TPN ADULT (ION) 80 mL/hr at 11/07/21 1000  ? TPN ADULT (ION)    ? ? ?PRN Meds: ?acetaminophen, antiseptic oral rinse, fentaNYL (SUBLIMAZE) injection, labetalol, lip balm, LORazepam, ondansetron (ZOFRAN) IV, sodium chloride flush ? ?Physical Exam         ?Appears chronically ill and frail ?Has dried dark blood material on tongue not ale to verbalize well ?Appears tachypneic today ?Has JP drain ?Has ostomy ?Appears with generalized weakness ? ?Vital Signs: BP 136/67   Pulse (!) 106   Temp 98.2 ?F (36.8 ?C) (Axillary)   Resp (!) 27   Ht 5' 10.5" (1.791 m)    Wt 54 kg   SpO2 94%   BMI 16.84 kg/m?  ?SpO2: SpO2: 94 % ?O2 Device: O2 Device: Room Air ?O2 Flow Rate: O2 Flow Rate (L/min): 4 L/min ? ?Intake/output summary:  ?Intake/Output Summary (Last 24 hours) at 11/07/2021 1227 ?Last data filed at 11/07/2021 1000 ?Gross per 24 hour  ?Intake 4665.76 ml  ?Output 1922 ml  ?Net 2743.76 ml  ? ?LBM: Last BM Date : 11/06/21 ?Baseline Weight: Weight: 54  kg ?Most recent weight: Weight: 54 kg ? ?     ?Palliative Assessment/Data: ? ? ? ? ? ?Patient Active Problem List  ? Diagnosis Date Noted  ? Dehydration 11/05/2021  ? Sinus tachycardia 11/05/2021  ? Normocytic anemia 11/04/2021  ? Acute respiratory failure with hypoxia ([place]) 11/03/2021  ? Bacteremia due to Gram-negative bacteria 11/03/2021  ? Acute metabolic encephalopathy 67/34/1937  ? Thrombocytopenia ([place]) 11/03/2021  ? Hypernatremia, hyponatremia, hypokalemia, hyperphosphatemia 11/03/2021  ? Hypernatremia 11/03/2021  ? Hypokalemia 11/03/2021  ? Hyperphosphatemia 11/03/2021  ? Hyponatremia 11/03/2021  ? Elevated brain natriuretic peptide (BNP) level 11/03/2021  ? Right proximal humeral fracture 11/03/2021  ? Oropharyngeal dysphagia 11/03/2021  ? Colostomy in place [facility]) 10/30/2021  ? Stricture and stenosis of esophagus 10/30/2021  ? Gastrostomy tube in place [facility]) 10/30/2021  ? AKI (acute kidney injury) ([place]) 10/25/2021  ? Anxiety 10/24/2021  ? Chronic pain 10/24/2021  ? Allergic rhinitis 10/24/2021  ? 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 Tobacco use disorder 10/24/2021  ? Underweight 10/24/2021  ? Acquired hammer toe of right foot 10/24/2021  ? Uncontrolled hypertension 10/24/2021  ? Urticaria 10/24/2021  ? Dry mouth 10/24/2021  ? Protein-calorie malnutrition, severe ([place]) 10/24/2021  ? Severe sepsis due to perforated sigmoid colon s/p Hartmann/colostomy 10/22/2021 10/22/2021  ? Substance induced mood disorder ([place])   ? Delirium tremens ([place]) 12/04/2017  ? ? ?Palliative Care Assessment & Plan  ? ?Patient Profile: ?   ? ?Assessment: ?61 year old lady with history of alcohol use disorder, tobacco use, recent right proximal humeral fracture, presented to the hospital at this time with diffuse abdominal pain.  Was found to have sepsis sigmoid perforation, was placed on antibiotics and underwent ex lap with Hartman's procedure and G-tube placement on 10-22-2021.  Postop course complicated by patient requiring reintubation and vasopressors on 4-17 and then she was extubated on 4-20.  Periodically has required Precedex due to agitation and hallucinations.  Found to have postop abdominal abscess requiring drainage on 4-22.  Started on TPN due to tube feed intolerance.  Weaned off of vasopressors as well as Precedex.  Hospital course complicated by feculent output from laparotomy site and repeat CT abdomen on 4-28 with new fluid collection within the cul-de-sac.  Remains on IV antibiotics and TPN.  Interventional radiology and general surgery are following.  Palliative medicine team consulted for ongoing goals of care discussions.  At the time of initial consultation patient had endorsed full code/full scope.  She had stated that she had survived her colon bursting inside of her and hence wished to continue with any and all means to get better.  Hospital course complicated by patient having new fluid collection in her abdomen on repeat imaging as well as ongoing weakness. ? ?Recommendations/Plan: ?Continue current mode of care.  Attempted to contact boyfriend Jessica [name] at [phone], unable to reach.  Palliative medicine team will follow the patient's hospital course and overall disease trajectory of illness so as to continue further goals of care conversations.  Concern remains for the patient having high risk for ongoing decline in decompensation and spite of current measures.PMT to follow.  ? ?Goals of Care and Additional Recommendations: ?Limitations on Scope of Treatment: Full Scope Treatment ? ?Code Status: ? ?  ?Code Status  Orders  ?(From admission, onward)  ?  ? ? ?  ? ?  Start     Ordered  ? 10/22/21 1429  Full code  Continuous       ? 10/22/21 1431  ? ?  ?  ? ?  ? ?  Code Status History   ? ? Date Active Date Inactive Code Status Order ID Comments User Context  ? 12/04/2017 2253 12/12/2017 1930 Full Code [id number]  Reyne Dumas, MD ED  ? ?  ? ? ?Prognosis: ? Guarded  ? ?Discharge Planning: ?To Be Determined based on hospital course.  ? ?Care plan was discussed with patient, multiple calls made to boyfriend Jessica [name] at [phone], unable to reach him. PMT to follow up again on 11-08-21.  ? ?Thank you for allowing the Palliative Medicine Team to assist in the care of this patient. ? ? ?Time In: 12 Time Out: 12.25 Total Time 25 Prolonged Time Billed  no   ? ?   ?Greater than 50%  of this time was spent counseling and coordinating care related to the above assessment and plan. ? ?Loistine Chance, MD ? ?Please contact Palliative Medicine Team phone at [phone] for questions and concerns.  ? ? ? ? ? ?

## 2021-11-07 NOTE — Progress Notes (Signed)
?PROGRESS NOTE ? ?Jessica [name] AJG:[id number] DOB: July 09, 1961  ? ?PCP: Deland Pretty, MD ? ?Patient is from: Home ? ?DOA: 10/22/2021 LOS: 16 ? ?Chief complaints ?Chief Complaint  ?Patient presents with  ? Abdominal Pain  ? Weakness  ? Constipation  ?  ? ?Brief Narrative / Interim history: ?61 year old F with history of alcohol use disorder and prior withdrawal with DT, tobacco use disorder and recent right proximal humeral fracture presented with severe diffuse abdominal pain for 1 day and admitted to ICU with severe sepsis due to feculent peritonitis with sigmoid perforation.  She was started on IV Zosyn.  She underwent ex lap with Hartman's procedure and G-tube placement on 10/22/2021.  She required reintubation and vasopressors on 4/17.  She was extubated on 4/20.  She also required Precedex due to agitation and hallucination.  She had IR drainage of post op abd abscess 4/22(10 fr to JP)- feculent output. She was started on TPN on 4/22 due to tube feed intolerance.  She was weaned off vasopressors on 4/24.  She came off Precedex on 4/25.  Palliative medicine involved.  She was transferred to [facility] hospitalist service on 4/27 on room air.  ? ?Patient with feculent output from laparotomy site.  Repeat CT abdomen on 4/28 with new fluid collection within the cul-de-sac measuring 6.6 x 3.4 cm, improved appearance of small bowel distention.  Remains on IV Zosyn and TPN.  General surgery, IR and palliative medicine following. ? ? ?Subjective: ?Seen and examined earlier this morning.  She is awake but not quite alert.  She follows some commands.  Not able to speak due to dry oral mucous and tongue. ? ?Objective: ?Vitals:  ? 11/07/21 0847 11/07/21 0900 11/07/21 0909 11/07/21 1000  ?BP:  139/77  136/67  ?Pulse: (!) 108 (!) 115  (!) 106  ?Resp: (!) 30 (!) 24  (!) 27  ?Temp:      ?TempSrc:      ?SpO2: 95% 96% 94% 94%  ?Weight:      ?Height:      ? ? ?Examination: ? ?GENERAL: Frail and chronically ill-appearing. ?HEENT: Dry  oral mucosa with some dried blood at the base of the tongue. ?NECK: Supple.  No apparent JVD.  ?RESP:  No IWOB.  Tachypneic.  Fair aeration bilaterally. ?CVS: Slightly tachycardic.  Heart sounds normal.  ?ABD/GI/GU: JP drain with minimal purulent output.  Ostomy bag with normal looking stool.  Bulky dressing over laparotomy wound ?MSK/EXT: Significant muscle mass and subcu fat loss. ?SKIN: See abdominal exam ?NEURO: Awake but not quite alert.  Follows some commands.  Looks lethargic.  No apparent focal neuro deficit but limited exam ?PSYCH: Looks tired and exhausted. ? ?Procedures:  ?4/15-ex lap with Hartman's procedure and gastrostomy tube placement on 3/55 by Dr. Leighton Ruff ? ?Microbiology summarized: ?4/15-blood culture with pansensitive Klebsiella Oxytoca except to ampicillin in 1 out of 2 bottles. ?4/16-MRSA PCR screen negative. ?4/22-peritoneal fluid culture with pansensitive rare Pseudomonas aeruginosa ?4/24-repeat blood cultures NGTD ? 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 ?IR drainage of post op abd abscess 4/22(10 fr to JP)- feculent output. ?Feculent output from laparotomy site on 4/27.   ?CT abdomen on 4/28 with new fluid collection within the cul-de-sac measuring 6.6 x 3.4 cm, improved appearance of small bowel distention. ?TPN per surgery-started on 4/19 due to TF intolerance ?Remains on IV Zosyn 4/20 >>> ?She looks very dehydrated from drain output and insensible  loss.  LR bolus followed by maintenance ? ?Oropharyngeal dysphagia ?Could be due to to intubation and encephalopathy ?-N.p.o. and oral care. ?-SLP on board ?-TPN for nutrition ? ? ?Acute metabolic encephalopathy ?Multifactorial including severe sepsis, alcohol withdrawal, ICU delirium, possible uremia, polypharmacy.  She is sleepy but wakes to voice.  Not quite alert.  Difficult to assess orientation due to aphonia.  Follows some commands.   No agitation. ?-Treat treatable causes ?-Reorientation and delirium precautions. ?-Avoid/minimize sedating medications ? ?Bacteremia due to Gram-negative bacteria ?On IV Zosyn 4/20>>>.  Repeat blood culture on 4/24 negative. ? ?Acute respiratory failure with hypoxia ([place]) ?Likely due to severe sepsis.  Reintubated on 4/16 and extubated on 4/20.  Respiratory culture with Pseudomonas aeruginosa.  Currently on room air. ?Covered on IV Zosyn as above. ?Pulmonary hygiene and aspiration precaution ?Continue scheduled DuoNebs ? ?Delirium tremens ([place]) ?Came off Precedex on 11/01/2021.  She should be outside withdrawal window.  No further agitation ?-Reorientation and delirium precaution ?-Continue thiamine, folic acid and multivitamin ?-Ativan as needed ?-Started clonidine patch on 4/28 mainly for hypertension ? ?Sinus tachycardia ?Multifactorial including acute infection, dehydration  ?-Antihypertensive meds as above ?-IV fluid ? ?Dehydration ?She seems to have significant insensible loss and drain output ?-LR bolus followed by maintenance ? ?Normocytic anemia ?Recent Labs  ?  10/29/21 ?0336 10/29/21 ?0347 10/30/21 ?4259 10/31/21 ?0330 10/31/21 ?1858 11/01/21 ?0236 11/02/21 ?0500 11/04/21 ?0600 11/06/21 ?5638 11/07/21 ?0500  ?HGB 6.6* 9.3* 8.8* 8.7* 8.1* 8.2* 8.2* 8.5* 8.2* 7.4*  ?Slight drop in Hgb.  Likely dilutional from IV fluid.  No obvious signs of bleeding. ?Continue monitoring ? ? ?Right proximal humeral fracture ?Continue nonweightbearing on right arm ?-Outpatient follow-up with orthopedic surgery ? ?Elevated brain natriuretic peptide (BNP) level ?Due to sepsis?  TTE basically normal but not great quality.  Does not appear fluid overloaded.  Probably dehydrated. ? ?Hypernatremia, hyponatremia, hypokalemia, hyperphosphatemia ?Resolved. ? ?AKI (acute kidney injury) ([place]) ?Recent Labs  ?  10/31/21 ?0330 10/31/21 ?1459 10/31/21 ?1858 11/01/21 ?0236 11/01/21 ?1509 11/02/21 ?0500 11/03/21 ?0600 11/05/21 ?0841  11/06/21 ?7564 11/07/21 ?0850  ?BUN 75* 75* 76* 84* 82* 78* 78* 85* 84* 74*  ?CREATININE 2.02* 1.98* 2.01* 1.93* 2.09* 1.88* 1.95* 1.95* 1.82* 1.72*  ?Unknown baseline. Her Cr was in the range of 0.5-0.6 in 2019.  No interval value. ?-Continue monitoring ?-Avoid nephrotoxic meds ?-LR bolus followed by maintenance ? ? ?Protein-calorie malnutrition, severe ([place]) ?Nutrition Status: ?Nutrition Problem: Moderate Malnutrition ?Etiology: chronic illness (alcohol abuse) ?Signs/Symptoms: moderate fat depletion, moderate muscle depletion, severe muscle depletion ?Interventions: TPN ? ?Uncontrolled hypertension ?Blood pressure within acceptable range. ?-Added clonidine patch 0.1 mg on 4/27 ?-Continue IV metoprolol 5 mg every 6 hours ?-IV labetalol to 20 mg every 2 hours as needed ? ?Tobacco use disorder ?Encourage cessation when able to comprehend. ?-Continue nicotine patch ? ?Chronic pain ?Does not appear to be in pain.   ?-As needed fentanyl for pain control-reduced dose ? ? ?Thrombocytopenia ([place]) ?Likely due to acute illness and alcohol use disorder.  Resolved. ? ? ? ? ?Pressure skin injury ?Pressure Injury 10/27/21 Vertebral column Mid;Medial Deep Tissue Pressure Injury - Purple or maroon localized area of discolored intact skin or blood-filled blister due to damage of underlying soft tissue from pressure and/or shear. (Active)  ?10/27/21 0428  ?Location: Vertebral column  ?Location Orientation: Mid;Medial  ?Staging: Deep Tissue Pressure Injury - Purple or maroon localized area of discolored intact skin or blood-filled blister due to damage of underlying soft tissue from pressure and/or shear.  ?  Wound Description (Comments):   ?Present on Admission: No  ?Dressing Type Foam - Lift dressing to assess site every shift 11/06/21 2000  ? ?DVT prophylaxis:  ?heparin injection 5,000 Units Start: 10/30/21 1400 ?Place and maintain sequential compression device Start: 10/25/21 0954 ?SCDs Start: 10/22/21 1429 ? ?Code Status: Full  code ?Family Communication: None at bedside today. ?Level of care: Stepdown ?Status is: Inpatient ?Remains inpatient appropriate because: Severe sepsis in the setting of peritonitis requiring IV antibiotics and TPN,

## 2021-11-07 NOTE — Progress Notes (Signed)
Physical Therapy Treatment ?Patient Details ?Name: Jessica [name] ?MRN: [id number] ?DOB: 14-Nov-1960 ?Today's Date: 11/07/2021 ? ? 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  Pt intubated post op and extubated 10/27/21.    Pt with hx of ETOH abuse and recent R shoulder fx (09/20/21) with X ray from 10/27/21 indicating fx still present. ? ?  ?PT Comments  ? ? Pt admitted with above diagnosis. Pt was able to sit EOB with mod assist to max assist of 2 for 10 min.  Pt leans posterior and to left.  Pt does appear to attempt to follow some commands however also resists movement at times. Will continue to follow acutely. Pt currently with functional limitations due to balance and endurance deficits. Pt will benefit from skilled PT to increase their independence and safety with mobility to allow discharge to the venue listed below.      ?Recommendations for follow up therapy are one component of a multi-disciplinary discharge planning process, led by the attending physician.  Recommendations may be updated based on patient status, additional functional criteria and insurance authorization. ? ?Follow Up Recommendations ? Skilled nursing-short term rehab (<3 hours/day) ?  ?  ?Assistance Recommended at Discharge Frequent or constant Supervision/Assistance  ?Patient can return home with the following Two people to help with walking and/or transfers;A lot of help with bathing/dressing/bathroom;Assistance with cooking/housework;Assist for transportation;Help with stairs or ramp for entrance ?  ?Equipment Recommendations ? Other (comment) (TBD)  ?  ?Recommendations for Other Services OT consult ? ? ?  ?Precautions / Restrictions Precautions ?Precautions: Fall ?Precaution Comments: multiple lines--> L JP drain, gastrostomy tube, triple lumen central line ?Restrictions ?Weight Bearing Restrictions: No ?RUE Weight Bearing: Non weight bearing ?Other  Position/Activity Restrictions: recent R UE/shoulder fx - assume NWB, though per chart review RUE WB status expired on 10/27/21.  ?  ? ?Mobility ? Bed Mobility ?Overal bed mobility: Needs Assistance ?Bed Mobility: Supine to Sit, Sit to Supine ?  ?  ?Supine to sit: Total assist, +2 for physical assistance, +2 for safety/equipment ?Sit to supine: Total assist, +2 for physical assistance, +2 for safety/equipment ?  ?General bed mobility comments: Attempt to have patient log roll but needing extensive assistance, total A +2 with use of bed pad to transition supine <> sit. ?  ? ?Transfers ?  ?  ?  ?  ?  ?  ?  ?  ?  ?General transfer comment: Deferred 2* limited sitting balance ?  ? ?Ambulation/Gait ?  ?  ?  ?  ?  ?  ?  ?  ? ? ?Stairs ?  ?  ?  ?  ?  ? ? ?Wheelchair Mobility ?  ? ?Modified Rankin (Stroke Patients Only) ?  ? ? ?  ?Balance Overall balance assessment: Needs assistance ?Sitting-balance support: Feet supported ?Sitting balance-Leahy Scale: Poor ?Sitting balance - Comments: Patient able to sit EOB with balls of feet on floor and maintain sitting balance without BUE support but at times had heavy lean posteriorly, unable to correct with mod A from therapist, worked on incr upright sitting by trying to stretch pt and use towel to incr anterior pelvic tilt without much success. Pt leans to left with preference of that side.  Tried to weight shift to pts right hip and pt resists.  Pt sat about 10 minutes before wanting to lie back down. changed pads as purewick had leaked. ?Postural control: Posterior lean, Left  lateral lean ?  ?  ?  ?  ?  ?  ?  ?  ?  ?  ?  ?  ?  ?  ?  ? ?  ?Cognition Arousal/Alertness: Awake/alert ?Behavior During Therapy: [facility] for tasks assessed/performed ?Overall Cognitive Status: Difficult to assess ?Area of Impairment: Orientation, Attention, Following commands, Problem solving, Awareness, Safety/judgement ?  ?  ?  ?  ?  ?  ?  ?  ?Orientation Level: Disoriented to, Situation, Time ?Current  Attention Level: Focused ?  ?Following Commands: Follows multi-step commands inconsistently, Follows one step commands consistently ?  ?  ?Problem Solving: Decreased initiation, Difficulty sequencing, Requires verbal cues, Slow processing, Requires tactile cues ?General Comments: participates with multi-modal cues, pt speaks almost constatntly however is extremely dysarthric with low volume, nearly impossible to understand ?  ?  ? ?  ?Exercises Low Level/ICU Exercises ?Ankle Circles/Pumps: AROM, Both, 10 reps, Supine ?Heel Slides: AAROM, Both, 10 reps, Supine ?Other Exercises ?Other Exercises: Attempted bridging with pt doing 2 reps with max assist to lift and holding feet in place. ? ?  ?General Comments   ?  ?  ? ?Pertinent Vitals/Pain Pain Assessment ?Pain Assessment: Faces ?Faces Pain Scale: Hurts little more ?Breathing: normal ?Negative Vocalization: none ?Facial Expression: smiling or inexpressive ?Body Language: relaxed ?Consolability: no need to console ?PAINAD Score: 0 ?Pain Location: Abdomen ?Pain Descriptors / Indicators: Discomfort, Grimacing ?Pain Intervention(s): Limited activity within patient's tolerance, Monitored during session, Repositioned, RN gave pain meds during session  ? ? ?Home Living   ?  ?  ?  ?  ?  ?  ?  ?  ?  ?   ?  ?Prior Function    ?  ?  ?   ? ?PT Goals (current goals can now be found in the care plan section) Acute Rehab PT Goals ?Patient Stated Goal: Lie back down ?Progress towards PT goals: Progressing toward goals ? ?  ?Frequency ? ? ? Min 2X/week ? ? ? ?  ?PT Plan Current plan remains appropriate;Frequency needs to be updated  ? ? ?Co-evaluation   ?  ?  ?  ?  ? ?  ?AM-PAC PT "6 Clicks" Mobility   ?Outcome Measure ? Help needed turning from your back to your side while in a flat bed without using bedrails?: Total ?Help needed moving from lying on your back to sitting on the side of a flat bed without using bedrails?: Total ?Help needed moving to and from a bed to a chair  (including a wheelchair)?: Total ?Help needed standing up from a chair using your arms (e.g., wheelchair or bedside chair)?: Total ?Help needed to walk in hospital room?: Total ?Help needed climbing 3-5 steps with a railing? : Total ?6 Click Score: 6 ? ?  ?End of Session Equipment Utilized During Treatment: Oxygen;Gait belt ?Activity Tolerance: Patient limited by fatigue;Other (comment) (cognition) ?Patient left: in bed;with call bell/phone within reach;with bed alarm set;Other (comment) (Bed placed in chair position, nurse notified) ?Nurse Communication: Mobility status;Need for lift equipment ?PT Visit Diagnosis: Muscle weakness (generalized) (M62.81);History of falling (Z91.81);Difficulty in walking, not elsewhere classified (R26.2);Pain ?Pain - part of body:  (abdomen) ?  ? ? ?Time: 1610-9604 ?PT Time Calculation (min) (ACUTE ONLY): 26 min ? ?Charges:  $Therapeutic Exercise: 8-22 mins ?$Therapeutic Activity: 8-22 mins          ?          ? ?Chanoch Mccleery M,PT ?Acute Rehab Services ?[phone] ?[phone] (pager)  ? ? ?  Alvira Philips ?11/07/2021, 3:51 PM ? ?

## 2021-11-07 NOTE — Procedures (Signed)
Intubation Procedure Note ? ?Bowie A Niblett  ?[id number]  ?Jan 03, 1961 ? ?Date:11/07/21  ?Time:6:53 PM  ? ?Provider Performing:Klyn Kroening  ? ? ?Procedure: Intubation (57473) ? ?Indication(s) ?Respiratory Failure ? ?Consent ?Unable to obtain consent due to emergent nature of procedure. ? ? ?Anesthesia ?Etomidate, Versed, Fentanyl, and Rocuronium ? ? ?Time Out ?Verified patient identification, verified procedure, site/side was marked, verified correct patient position, special equipment/implants available, medications/allergies/relevant history reviewed, required imaging and test results available. ? ? ?Sterile Technique ?Usual hand hygeine, masks, and gloves were used ? ? ?Procedure Description ?Patient positioned in bed supine.  Sedation given as noted above.  Patient was intubated with endotracheal tube using Glidescope.  View was Grade 1 full glottis .  Number of attempts was 1.  Colorimetric CO2 detector was consistent with tracheal placement. THICK MUCUS BLOCKING VOCAL CORDS - likely cause of resp decline ? ? ?Complications/Tolerance ?None; patient tolerated the procedure well. ?Chest X-ray is ordered to verify placement. ? ? ?EBL ?NONE ? ? ?Specimen(s) ?None ?I ? ? ? ?SIGNATURE  ? ? ?Dr. Brand Males, M.D., F.C.C.P,  ?Pulmonary and Critical Care Medicine ?Staff Physician, [place] ?Center Director - Interstitial Lung Disease  Program  ?Medical Director - [facility] ?[facility] [place] [facility] ?[place], [place], [address] ? ?NPI Number:  NPI #[id number] ?DEA Number: [id number] ? ?Pager: [phone], If no answer  -> Check AMION or Try [phone] ?Telephone (clinical office): [phone] ?Telephone (research): [phone] ? ?6:54 PM ?11/07/2021 ? ?

## 2021-11-08 LAB — MAGNESIUM: Magnesium: 1.9 mg/dL (ref 1.7–2.4)

## 2021-11-08 LAB — GLUCOSE, CAPILLARY
Glucose-Capillary: 110 mg/dL — ABNORMAL HIGH (ref 70–99)
Glucose-Capillary: 111 mg/dL — ABNORMAL HIGH (ref 70–99)
Glucose-Capillary: 113 mg/dL — ABNORMAL HIGH (ref 70–99)
Glucose-Capillary: 120 mg/dL — ABNORMAL HIGH (ref 70–99)
Glucose-Capillary: 133 mg/dL — ABNORMAL HIGH (ref 70–99)
Glucose-Capillary: 97 mg/dL (ref 70–99)

## 2021-11-08 LAB — CBC
HCT: 22.3 % — ABNORMAL LOW (ref 36.0–46.0)
Hemoglobin: 7.1 g/dL — ABNORMAL LOW (ref 12.0–15.0)
MCH: 29.5 pg (ref 26.0–34.0)
MCHC: 31.8 g/dL (ref 30.0–36.0)
MCV: 92.5 fL (ref 80.0–100.0)
Platelets: 373 10*3/uL (ref 150–400)
RBC: 2.41 MIL/uL — ABNORMAL LOW (ref 3.87–5.11)
RDW: 15.9 % — ABNORMAL HIGH (ref 11.5–15.5)
WBC: 14.5 10*3/uL — ABNORMAL HIGH (ref 4.0–10.5)
nRBC: 0 % (ref 0.0–0.2)

## 2021-11-08 LAB — BASIC METABOLIC PANEL
Anion gap: 6 (ref 5–15)
BUN: 73 mg/dL — ABNORMAL HIGH (ref 6–20)
CO2: 25 mmol/L (ref 22–32)
Calcium: 8.1 mg/dL — ABNORMAL LOW (ref 8.9–10.3)
Chloride: 108 mmol/L (ref 98–111)
Creatinine, Ser: 1.55 mg/dL — ABNORMAL HIGH (ref 0.44–1.00)
GFR, Estimated: 38 mL/min — ABNORMAL LOW (ref >60)
Glucose, Bld: 146 mg/dL — ABNORMAL HIGH (ref 70–99)
Potassium: 4.5 mmol/L (ref 3.5–5.1)
Sodium: 139 mmol/L (ref 135–145)

## 2021-11-08 MED ORDER — METOPROLOL TARTRATE 5 MG/5ML IV SOLN: 2.5000 mg | INTRAVENOUS | Status: AC | PRN

## 2021-11-08 MED ORDER — TRAVASOL 10 % IV SOLN
INTRAVENOUS | Status: AC
  Filled 2021-11-08: qty 1113.6

## 2021-11-08 NOTE — Progress Notes (Signed)
50cc brown from midline  ?

## 2021-11-08 NOTE — Progress Notes (Signed)
SLP Cancellation Note ? ?Patient Details ?Name: Jessica [name] ?MRN: [id number] ?DOB: 02-05-1961 ? ? ?Cancelled treatment:       Reason Eval/Treat Not Completed: Other (comment) (pt now intubated due to respiratory compromise, per chart copious oropharygneal secretions present) ? ?Kathleen Lime, MS CCC SLP ?Acute Rehab Services ?Office [phone] ?Pager [phone] ? ?Jessica [name] ?11/08/2021, 9:20 AM ?

## 2021-11-08 NOTE — Progress Notes (Signed)
Occupational Therapy Discharge ?Patient Details ?Name: Jessica [name] ?MRN: [id number] ?DOB: 1960-10-17 ?Today's Date: 11/08/2021 ?Time:  -  ?  ? ?Patient discharged from OT services secondary to medical decline - will need to re-order OT to resume therapy services. Patient intubated  ? ?Please see latest therapy progress note for current level of functioning and progress toward goals.   ? ?Progress and discharge plan discussed with patient and/or caregiver: Patient unable to participate in discharge planning and no caregivers available ? ?Delbert Phenix OT ?OT pager: [phone] ? ? ?GO ?   ? ?Rosemary Holms ?11/08/2021, 6:56 AM  ?

## 2021-11-08 NOTE — Progress Notes (Addendum)
PHARMACY - TOTAL PARENTERAL NUTRITION CONSULT NOTE  ? ?Indication: Prolonged ileus ? ?Patient Measurements: ?Height: 5' 10.5" (179.1 cm) ?Weight: 54.2 kg (119 lb 7.8 oz) ?IBW/kg (Calculated) : 69.65 ?TPN AdjBW (KG): 53.8 ?Body mass index is 16.9 kg/m?. ?Usual Weight: 53.8 kg ? ?Assessment: 61 yo F w/ PMH etoh use disorder, smoker, HTN, GERD ?s/p Exp Lap Hartmans 10/22/2021 for perforated sigmoid colon, Dr. Marcello Moores  ? ?Glucose / Insulin: no hx DM.  CBGs continue to be <150.  Minimal SSI use and discontinued 5/1 ?Electrolytes: Na improved down to 139 (removed Na 4/24), K 4.5 stable (goal K >=4), Mag 1.9 stable (goal >=2, however albumin low at 2.2 (5/1) affecting magnesium level), Cl 108 stable ?Vitamins:  Vitamin C <0.1 (4/22), receiving 200 mg ascorbic acid daily in TPN from MVI ?Vitamin A 14.2 (4/22), receiving 1 mg of vitamin A daily in TPN from MVI ?Unable to add additional ascorbic acid and vitamin A to TPN per TPN policy ?Renal: SCr 1.72 improved, BUN 74 down ?Hepatic: LFTs improved to WNL; T bili down to 0.9, TG 108 (note they were drawn earlier this AM and level was 628, would assume this was drawn from TPN line, can check another TG if warranted in next few days ?Intake / Output;  UOP 800 ml last 24 hr down, Drains 980 mL/day, no Stool output charted ?Net I/O yesterday: 1578 mL ?MIVF:  D51/2NS 4/29 - changed to D5W 4/30 due to increasing Na - Na now improved 5/1 Md d/c'd D5W but did give LR bolus bc pt still dry per discussion ?GI Imaging:  ?4/15 CTA: bowel perforation ?4/17 KUB ileus or pSBO ?4/20 CTA: severe ileus or pSBO ? ?GI Surgeries / Procedures:  ?10/22/21 s/p EXPLORATORY LAPAROTOMY hartmans for perforated sigmoid colon, Dr. Marcello Moores  ?4/23 IR placed drain LLQ ?Central access: CVC TL placed 10/23/21 ?TPN start date: 10/26/21 ? ?Nutritional Goals: ?Goal TPN rate is 80 mL/hr (provides ~ 111g of protein and ~ 2001 kcals per day)  ? ?RD Assessment: ?Estimated Needs ?Total Energy Estimated Needs: 1900-2150  kcal ?Total Protein Estimated Needs: 110-125 grams ?Total Fluid Estimated Needs: >/= 3 L/day ? ?Current Nutrition:  ?TPN @ 80 ml/hr  ?NPO  ?Starting NG clamping trial on 4/26 ? ?Plan:  ?Continue TPN @  80 mL/hr to provide 100% of goals ?Electrolytes in TPN:  ?Na 0 mEq/L, K 70 mEq/L, Ca 7mq/L, Mg 656m/L, and Phos 26m56m/L. Cl:Ac max Ac - no changes today ?Continue standard MVI and trace elements to TPN ?Continue folic acid & thiamine in TPN ?Monitor TPN labs on Mon/Thurs, BMP tomorrow am ? ? ?MelRoyetta AsalharmD, BCPS ?Clinical Pharmacist ?ConDresserlease utilize Amion for appropriate phone number to reach the unit pharmacist ([facility] [place]5/08/2021 8:09 AM ? ? ? ?

## 2021-11-08 NOTE — Progress Notes (Signed)
Nutrition Follow-up ? ?DOCUMENTATION CODES:  ? ?Non-severe (moderate) malnutrition in context of chronic illness, Underweight ? ?INTERVENTION:  ?- continue TPN regimen per Pharmacist. ? ?- once able to provide things via G-tube, recommend vitamin A supplementation due to result of 14.2 (ref range: 22-69.5) ? ?- will monitor for ability to initiate TF via G-tube. ? ?- recommend 500 mg ascorbic acid BID d/t vitamin C result of <0.1 (ref range: 0.4-2) ? ? ?NUTRITION DIAGNOSIS:  ? ?Moderate Malnutrition related to chronic illness (alcohol abuse) as evidenced by moderate fat depletion, moderate muscle depletion, severe muscle depletion. -ongoing ? ?GOAL:  ? ?Patient will meet greater than or equal to 90% of their needs -met with TPN regimen ? ?MONITOR:  ? ?Labs, Weight trends, Skin, I & O's, Other (Comment) (TPN regimen; ability to initiate TF via G-tube) ? ?REASON FOR ASSESSMENT:  ? ?Ventilator ? ?ASSESSMENT:  ? ?61 year old female with medical history of tobacco abuse and alcohol use disorder with prior severe withdrawal/DT requiring ICU admission and precede. She presented to the ED due to severe, diffuse abdominal pain that began the night prior and constipation x4 days. She reported frequent alcohol ingestion and last drink was the day PTA. In ED she was found to have pneumoperitoneum and free fluid on CT abdomen/pelvis and severe lactic acidosis. She was started on broad spectrum antibiotics and has been given a total of 5L of crystalloid. Surgery was consulted and she was taken to OR where she underwent ex lap and was found to have perforated sigmoid colon, then had hartman's procedure and insertion of g-tube which was left to gravity drainage, JP bulb in pelvis. She was noted to have feculent peritonitis in the pelvis. ? ?Significant Events:  ?4/15- admission; ex lap with Hartman's procedure with colostomy and insertion of feeding G-tube d/t feculent peritonitis and sigmoid perforation ?4/17- re-intubated; OGT  insertion; initial RD assessment ?4/18- OGT dislodgement ?4/19- TPN initiation ?4/20- initiation of TF at 20 ml/hr; extubation ?4/21- large volume emesis; G-tube clamped and then placed to LIWS; drainage of LLQ fluid collectomy ?4/22- checked vitamins A, B12, C, and D with 0500 labs ?4/28- triple lumen PICC placed in L brachial ?5/1- re-intubation ? ? ?Repeated NFPE this AM. Findings outlined below and mainly consistent with findings on 4/17. ? ?Patient discussed in rounds this AM. Discussed re-estimated kcal need with Pharmacist--plan to adjust TPN regimen on 5/3 to reflect this if patient is unable to be extubated today.  ? ?Discussed with CCM NP and Pharmacist about vitamin C lab result.  ? ?G-tube remains to LIS with ~75 ml brown-ish green output present. RN shared during rounds that output has been significantly decreased. ? ?Weight has been fairly stable over the past 6 days. Non-pitting edema to unknown areas documented in the edema section of flow sheet. ? ?She is receiving TPN via PICC at goal rate of 80 ml/hr which is providing 2001 kcal and 111 grams protein.  ? ? ?Patient is currently intubated on ventilator support ?MV: 9.5 L/min ?Temp (24hrs), Avg:98.9 ?F (37.2 ?C), Min:96.4 ?F (35.8 ?C), Max:101 ?F (38.3 ?C) ?Propofol: none ?BP: 111/52 and MAP: 71 ? ? ?Labs reviewed; CBGs: 111,110, and 97 mg/dl, BUN: 73 mg/dl, creatinine: 1.55 mg/dl, Ca: 8.1 mg/dl, GFR: 38 ml/min. ? ?Medications reviewed; 100 mg colace BID, 40 mg IV protonix/day, 17 g miralax/day, 100 mEq sodium bicarb x1 dose 5/1. ? ?Drips; fentanyl @ 75 mcg/hr, levo @ 3 mcg/min. ? ?  ? ?NUTRITION - FOCUSED PHYSICAL EXAM: ? ?  Flowsheet Row Most Recent Value  ?Orbital Region Unable to assess  [ETT holder]  ?Upper Arm Region Moderate depletion  ?Thoracic and Lumbar Region Moderate depletion  ?Buccal Region Unable to assess  [ETT holder]  ?Temple Region Severe depletion  ?Clavicle Bone Region Severe depletion  ?Clavicle and Acromion Bone Region Severe  depletion  ?Scapular Bone Region Severe depletion  ?Dorsal Hand Moderate depletion  ?Patellar Region Moderate depletion  ?Anterior Thigh Region Moderate depletion  ?Posterior Calf Region Unable to assess  [una boots]  ?Edema (RD Assessment) Unable to assess  ?Hair Reviewed  ?Eyes Unable to assess  ?Mouth Unable to assess  ?Skin Reviewed  ?Nails Reviewed  ? ?  ? ? ?Diet Order:   ?Diet Order   ? ?       ?  Diet NPO time specified Except for: Ice Chips  Diet effective now       ?  ? ?  ?  ? ?  ? ? ?EDUCATION NEEDS:  ? ?Not appropriate for education at this time ? ?Skin:  Skin Assessment: Skin Integrity Issues: ?Skin Integrity Issues:: DTI, Incisions ?DTI: vertebral column ?Incisions: abdomen (4/15 and 4/21) ? ?Last BM:  5/1 (type 7 x2 via colostomy documented in flow sheet but I/O sheet indicates 0 ml on 5/1) ? ?Height:  ? ?Ht Readings from Last 1 Encounters:  ?10/24/21 5' 10.5" (1.791 m)  ? ? ?Weight:  ? ?Wt Readings from Last 1 Encounters:  ?11/08/21 54.2 kg  ? ? ? ?BMI:  Body mass index is 16.9 kg/m?. ? ?Estimated Nutritional Needs:  ?Kcal:  1635 kcal ?Protein:  110-125 grams ?Fluid:  >/= 3 L/day ? ? ? ? ?Jarome Matin, MS, RD, LDN ?Registered Dietitian II ?Inpatient Clinical Nutrition ?RD pager # and on-call/weekend pager # available in [place]  ? ?

## 2021-11-08 NOTE — Progress Notes (Signed)
40cc brown liquid similar to stool from midline incision ?

## 2021-11-08 NOTE — Progress Notes (Addendum)
? ?                                                                                                                                                     ?                                                   ?Daily Progress Note  ? ?Patient Name: Jessica [name]       Date: 11/08/2021 ?DOB: [birth year]  Age: 61 y.o. MRN#: [id number] ?Attending Physician: Brand Males, MD ?Primary Care Physician: Deland Pretty, MD ?Admit Date: 10/22/2021 ? ?Reason for Consultation/Follow-up: Establishing goals of care ? ?Subjective: ?Intubated and sedated.  No one present at bedside.  Discussed with RN who reports that prior to being intubated, patient declined for her boy friend to be notified of intubation. ? ?I asked RN to let me know if any visitors to the bedside today. ? ?Length of Stay: 17 ? ?Current Medications: ?Scheduled Meds:  ? chlorhexidine gluconate (MEDLINE KIT)  15 mL Mouth Rinse BID  ? Chlorhexidine Gluconate Cloth  6 each Topical Daily  ? docusate  100 mg Per Tube BID  ? heparin injection (subcutaneous)  5,000 Units Subcutaneous Q8H  ? ipratropium-albuterol  3 mL Nebulization BID  ? mouth rinse  15 mL Mouth Rinse q12n4p  ? mouth rinse  15 mL Mouth Rinse 10 times per day  ? nicotine  14 mg Transdermal Daily  ? pantoprazole (PROTONIX) IV  40 mg Intravenous Q24H  ? polyethylene glycol  17 g Per Tube Daily  ? sodium chloride flush  10-40 mL Intracatheter Q12H  ? sodium chloride flush  5 mL Intracatheter Q8H  ? ? ?Continuous Infusions: ? sodium chloride Stopped (11/08/21 0134)  ? fentaNYL infusion INTRAVENOUS 50 mcg/hr (11/08/21 0200)  ? norepinephrine (LEVOPHED) Adult infusion 3 mcg/min (11/08/21 0612)  ? piperacillin-tazobactam (ZOSYN)  IV Stopped (11/08/21 0534)  ? TPN ADULT (ION) 80 mL/hr at 11/08/21 0200  ? TPN ADULT (ION)    ? ? ?PRN Meds: ?acetaminophen, antiseptic oral rinse, fentaNYL (SUBLIMAZE) injection, fentaNYL (SUBLIMAZE) injection, fentaNYL (SUBLIMAZE) injection, lip balm, LORazepam, metoprolol tartrate,  midazolam, midazolam, ondansetron (ZOFRAN) IV, sodium chloride flush ? ?Physical Exam         ?Appears chronically ill and frail ?Intubated and sedated ?Has JP drain ?Has ostomy ?Appears with generalized weakness ? ?Vital Signs: BP 129/73 (BP Location: Right Arm)   Pulse (!) 113   Temp 98.7 ?F (37.1 ?C) (Oral)   Resp 17   Ht 5' 10.5" (1.791 m)   Wt 54.2 kg   SpO2 100%   BMI 16.90 kg/m?  ?SpO2: SpO2: 100 % ?O2 Device: O2 Device: Ventilator ?  O2 Flow Rate: O2 Flow Rate (L/min): 15 L/min ? ?Intake/output summary:  ?Intake/Output Summary (Last 24 hours) at 11/08/2021 0953 ?Last data filed at 11/08/2021 0200 ?Gross per 24 hour  ?Intake 2594.82 ml  ?Output 1260 ml  ?Net 1334.82 ml  ? ? ?LBM: Last BM Date : 11/07/21 ?Baseline Weight: Weight: 54 kg ?Most recent weight: Weight: 54.2 kg ? ?     ?Palliative Assessment/Data: ? ? ? ? ? ?Patient Active Problem List  ? Diagnosis Date Noted  ? Severe sepsis due to perforated sigmoid colon s/p Hartmann/colostomy 10/22/2021 10/22/2021  ? Acute respiratory failure with hypoxia ([place]) 11/03/2021  ? Bacteremia due to Gram-negative bacteria 11/03/2021  ? Acute metabolic encephalopathy 44/07/270  ? Oropharyngeal dysphagia 11/03/2021  ? Delirium tremens ([place]) 12/04/2017  ? Dehydration 11/05/2021  ? Sinus tachycardia 11/05/2021  ? Normocytic anemia 11/04/2021  ? Hypernatremia, hyponatremia, hypokalemia, hyperphosphatemia 11/03/2021  ? Elevated brain natriuretic peptide (BNP) level 11/03/2021  ? Right proximal humeral fracture 11/03/2021  ? AKI (acute kidney injury) ([place]) 10/25/2021  ? Chronic pain 10/24/2021  ? Tobacco use disorder 10/24/2021  ? Uncontrolled hypertension 10/24/2021  ? Protein-calorie malnutrition, severe ([place]) 10/24/2021  ? Malnutrition of moderate degree 11/07/2021  ? Palliative care by specialist   ? Goals of care, counseling/discussion   ? General weakness   ? Thrombocytopenia ([place]) 11/03/2021  ? Hypernatremia 11/03/2021  ? Hypokalemia 11/03/2021  ? Hyperphosphatemia  11/03/2021  ? Hyponatremia 11/03/2021  ? Colostomy in place [facility]) 10/30/2021  ? Stricture and stenosis of esophagus 10/30/2021  ? Gastrostomy tube in place [facility]) 10/30/2021  ? Anxiety 10/24/2021  ? Allergic rhinitis 10/24/2021  ? 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 Underweight 10/24/2021  ? Acquired hammer toe of right foot 10/24/2021  ? Urticaria 10/24/2021  ? Dry mouth 10/24/2021  ? Substance induced mood disorder ([place])   ? ? ?Palliative Care Assessment & Plan  ? ?Patient Profile: ?  ? ?Assessment: ?61 year old lady with history of alcohol use disorder, tobacco use, recent right proximal humeral fracture, presented to the hospital at this time with diffuse abdominal pain.  Was found to have sepsis sigmoid perforation, was placed on antibiotics and underwent ex lap with Hartman's procedure and G-tube placement on 10-22-2021.  Postop course complicated by patient requiring reintubation and vasopressors on 4-17 and then she was extubated on 4-20.  Periodically has required Precedex due to agitation and hallucinations.  Found to have postop abdominal abscess requiring drainage on 4-22.  Started on TPN due to tube feed intolerance.  Weaned off of vasopressors as well as Precedex.  Hospital course complicated by feculent output from laparotomy site and repeat CT abdomen on 4-28 with new fluid collection within the cul-de-sac.  Remains on IV antibiotics and TPN.  Interventional radiology and general surgery are following.  Palliative medicine team consulted for ongoing goals of care discussions.  At the time of initial consultation patient had endorsed full code/full scope.  She had stated that she had survived her colon bursting inside of her and hence wished to continue with any and all means to get better.  Hospital course complicated by patient having new fluid collection in her abdomen on repeat imaging as well as ongoing weakness. ? ?Recommendations/Plan: ?Continue current mode of care. ?Complicated situation in regard  to surrogate decision making.  Patient had requested her boyfriend not be notified via phone of her requiring reintubation.  She has not other next of kin that has been identified.  Will plan to meet to discuss further  with boyfriend when he is in to visit. ? ?Goals of Care and Additional Recommendations: ?Limitations on Scope of Treatment: Full Scope Treatment ? ?Code Status: ? ?  ?Code Status Orders  ?(From admission, onward)  ?  ? ? ?  ? ?  Start     Ordered  ? 10/22/21 1429  Full code  Continuous       ? 10/22/21 1431  ? ?  ?  ? ?  ? ?Code Status History   ? ? Date Active Date Inactive Code Status Order ID Comments User Context  ? 12/04/2017 2253 12/12/2017 1930 Full Code [id number]  Reyne Dumas, MD ED  ? ?  ? ? ?Prognosis: ? Guarded  ? ?Discharge Planning: ?To Be Determined based on hospital course.  ? ?Care plan was discussed with RN. ? ?Thank you for allowing the Palliative Medicine Team to assist in the care of this patient. ? ? ?Time In: 0930 Time Out: 0955 Total Time 25 Prolonged Time Billed  no   ? ?Micheline Rough, MD ? ?Please contact Palliative Medicine Team phone at [phone] for questions and concerns.  ? ? ? ? ? ?

## 2021-11-08 NOTE — Progress Notes (Signed)
eLink Physician-Brief Progress Note ?Patient Name: Jessica [name] ?DOB: 1960-10-15 ?MRN: [id number] ? ? ?Date of Service ? 11/08/2021  ?HPI/Events of Note ? Patient with a persistent sinus tachy cardia with the rates rising as high as the 120's, no obvious treatable cause.  ?eICU Interventions ? Will order Lopressor 2.5 mg iv Q 4 hours PRN HR > 115, as long as SBP is > 105 mmHG  ? ? ? ?  ? ?Jessica [name] ?11/08/2021, 4:15 AM ?

## 2021-11-08 NOTE — Progress Notes (Signed)
25cc from midline incision. Brown liquid, similar to stool ?

## 2021-11-08 NOTE — Progress Notes (Addendum)
? ?NAME:  Jessica [name], MRN:  [id number], DOB:  [birth year], LOS: 20 ?ADMISSION DATE:  10/22/2021, CONSULTATION DATE:  10/22/21 ?REFERRING MD:  Francia Greaves, CHIEF COMPLAINT:  abdominal pain  ? ?History of Present Illness:  ?[name] with history of alcohol use disorder with prior severe withdrawal/DT's requiring ICU admission and precedex, smoking who presented to the ED 4/15 for severe diffuse abdominal pain that began the night before admission. She had had constipation for 4d PTA. Endorsed frequent alcohol use. Last drink was day PTA.  ? ?In ED she was found to have pneumoperitoneum and free fluid on CT A/P, severe lactic acidosis. She was started on broad spectrum antibiotics and has been given a total of 5L of crystalloid. Surgery was consulted and she was taken to OR where she underwent ex lap and was found to have perforated sigmoid colon, then had hartman's procedure and insertion of g-tube which was left to gravity drainage, JP bulb in pelvis. She was noted to have feculent peritonitis in the pelvis. ICU course complicated by klebsiella oxytoca bacteremia, profound protein calorie malnutrition requiring TPN, hypoxemic respiratory failure, tachycardia and failure to progress.  She was transferred to  ? ?Pertinent  Medical History  ?Etoh use disorder ?History of DT ?Smoking ? ?Significant Hospital Events: ?Including procedures, antibiotic start and stop dates in addition to other pertinent events   ?10/22/21 ex lap, hartman's procedure, g tube placement. Extubated in OR ?4/17 Reintubated overnight with continued pressor support x2. 11 liters positive thus far with progressive AKI this am  ?4/18 Continued issues with electrolyte abnormalities overnight, remains on levo and neo. She is now 14L positive but urine output has increased  ?4/19 No issues overnight, remains 14L positive but urine output continues to improve. Start to slowly diureses today  ?4/20 weaning NE, extubated, diuresed, started trickle TF, remains on  precedex for agitation  ?4/21 periods of agitation/ hallucinations, scheduled ativan helps, remains on precedex 0.7. CT A/P with suspicious for partial small bowel obstruction, with transition point in the lower central pelvis. Increased small to moderate amount of ascites, without definite focal abscess collection. Small amount of postop free air also noted. New small left pleural effusion and left lower lobe atelectasis. New diffuse body wall edema, consistent with anasarca. Cholelithiasis.  ?4/22 On  TPN.  Did not tolerated TF yesterday -vomitted and now G tube to LIS with bilios returns.  Low grade fever +. WBC now normal. On Zosyn. Hgb < 7g% without overt bleeding - 1 U PRBC. delirium worse.  ?4/24 Remains off vasopressors, overnight with agitation requiring precedex, 5 mg Haldol, 2 mg versed ?4/25 Out of bed. SLP eval declined by patient as well as OT.  ?4/26 Off precedex. Seen by palliative care > pt requested full code. No family.  ?4/27 Transferred to [facility]  ?5/1 PCCM called back for AMS, intubated.  ? ?Interim History / Subjective:  ?Intubated yesterday for declining mental status and progressive hypoxia. Copious oral and pharyngeal secretions noted on intubation. Levophed started overnight for BP support.  ? ?Objective   ?Blood pressure (!) 92/50, pulse (!) 105, temperature 98.9 ?F (37.2 ?C), temperature source Axillary, resp. rate 17, height 5' 10.5" (1.791 m), weight 54.2 kg, SpO2 100 %. ?   ?Vent Mode: PRVC ?FiO2 (%):  [80 %-100 %] 80 % ?Set Rate:  [18 bmp] 18 bmp ?Vt Set:  [550 mL] 550 mL ?PEEP:  [5 cmH20] 5 cmH20 ?Plateau Pressure:  [15 cmH20-25 cmH20] 15 cmH20  ? ?Intake/Output Summary (Last 24  hours) at 11/08/2021 1540 ?Last data filed at 11/08/2021 0200 ?Gross per 24 hour  ?Intake 2888.78 ml  ?Output 1310 ml  ?Net 1578.78 ml  ? ? ?Filed Weights  ? 11/06/21 0500 11/07/21 0500 11/08/21 0500  ?Weight: 54.2 kg 54 kg 54.2 kg  ? ? ?Examination: ?General:  Cachectic middle aged female on vent ?HEENT: [place]/AT,  PERRL, no JVD ?Neuro: SEdated on vent ?CV: RRR, no MRG ?PULM:  Clear bilateral vent supported breaths. Breathing over vent, breath stacking.  ?GI: Multiple drains all draining similar appearing murky brown/green feculent material. Midline incision remains open with eakins pouch in place draining feculent material. LLQ stoma looks good.  ?Extremities: warm/dry, trace edema/anasarca.   ?Skin: abdominal wounds as above.  ? ?Culture data reviewed by me:  ?Abdominal abscess 4/22 > Pseudomonas pan sensitive  ?Blood 4/15 > Klebsiella oxytoca ?Blood 4/24 > negative ? ?CXR reviewed by me 5/1 >ETT in good position. Trace to small left effusion. Hyperinflated lungs.  ?Resolved Hospital Problem list   ?[place] ?Lactic acidosis  ?Hyponatremia  ?Hypocalcemia ?Transaminitis ?Hypernatremia  ? ?Assessment & Plan:  ? ?Severe Sepis due to feculent peritonitis with sigmoid perforation  ?Klebsiella Oxytoca Bacteremia  ?S/p hartman's procedure, g-tube placement 4/15. C/T AP 4/21 suspicious for partial small bowel obstruction, with transition point in the lower central pelvis. Increased small to moderate amount of ascites, without definite focal abscess collection. Small amount of postop free air also noted. New small left pleural effusion and left lower lobe atelectasis. New diffuse body wall edema, consistent with anasarca. Cholelithiasis.  ?- post operative care per CCS  ?- drains per IR  ?- continue IV zosyn ?- TPN per CCS, initated 4/19  ?- CT with contrast would be helpful for CCS/IR, fortunately renal function has been improving. Hopefully we can pursue this in the next couple days. Also makes sense to do while intubated for higher quality study.  ?- WOC for colostomy care / surgical wounds  ?  ?Acute Hypoxic Respiratory Failure  ?Secondary to above, re-intubated overnight 4/16, Extubated 4/20.  Increased O2 needs, AMS requiring reintubation 5/1. Copious upper airway secretions seen on intubation. Post intubation CXR with small L  effusion, but no clear cause for hypoxia.  ?- Full vent support ?- Down to 40% FiO2 this morning.  ?- Vent dyssynchrony on PRVC, doing well on pressure support 5/5 with light sedation.  ?- VAP bundle, significant oral secretions per RN ?- Fentanyl infusion per PAD protocol for RASS -1 ? ?HTN ?Tachycardia  ?Now hypotensive after intubation- ? Sedation related vs hypovolemic.  ?- tele monitoring  ?- NE started overnight (3 mcg now) ?- Remove clonidine patch.  ?- PRN labetolol IV in place ? ?Acute Encephalopathy, multifactorial with ICU delirium, ongoing sepsis, ETOH withdrawal (should be out of window at this point) Has needed precedex before in ICU. ?Alcohol use and History of DT ?Acute Pain  ?-PAD protocol while on vent as above.  ?-PRN ativan  ?-TPN per pharmacy  ? ?AKI ?Hypernatremia  ?Hypokalemia  ?-Trend BMP / urinary output ?-Replace electrolytes as indicated ?-Avoid nephrotoxic agents, ensure adequate renal perfusion ? ?Thrombocytopenia  ?Normocytic Anemia No evidence of bleeding/ clotting.  Monitor closely.  Zosyn may be contributing.  ?-transfuse for Hgb <7% or active bleeding  ?-follow CBC ? ?Cachexia - Prior to & Present on Admit ?Severe Protein Calorie Malnuritioin - (ablg 2,7 at admit) - moderate Prior to & Present on Admit ?-TPN per pharmacy  ?-defer enteral nutrition to CCS ? ?Right proximal humerus  fracture, pta  ?S/p fall in March 2023. Seen by ortho 09/26/21 with plans for non weightbearing of RUE x1 month and then f/u with ortho in one month.  ?-outpatient ortho follow up  ?  ? ?Best Practice (right click and "Reselect all SmartList Selections" daily)  ?Diet/type: NPO TPN ?DVT prophylaxis: prophylactic heparin  ?GI prophylaxis: PPI ?Lines: Central line on TPN ?Foley:  N/A ?Code Status:  full code ?Last date of multidisciplinary goals of care discussion: Continue to update family daily. No family other than boyfriend identified. No next of kin.   ? ?Patient has asked [place] not to inform boyfriend  regarding intubation.  ? ?SIGNATURE  ? ?Critical care time: 43 minutes.  ? ?Georgann Housekeeper, AGACNP-BC ?[facility] ? ?See Amion for personal pager ?PCCM on call pager [phone] until 7pm.

## 2021-11-08 NOTE — Progress Notes (Signed)
17 Days Post-Op  ? ?Subjective/Chief Complaint: ?Pt intubated yesterday d/t resp compromise  ? ? ?Objective: ?Vital signs in last 24 hours: ?Temp:  [96.4 ?F (35.8 ?C)-101 ?F (38.3 ?C)] 98.9 ?F (37.2 ?C) (05/02 0530) ?Pulse Rate:  [86-117] 113 (05/02 0800) ?Resp:  [15-36] 17 (05/02 0800) ?BP: (90-184)/(49-111) 129/73 (05/02 0800) ?SpO2:  [83 %-100 %] 100 % (05/02 0800) ?FiO2 (%):  [40 %-100 %] 40 % (05/02 0753) ?Weight:  [54.2 kg] 54.2 kg (05/02 0500) ?Last BM Date : 11/07/21 ? ?Intake/Output from previous day: ?05/01 0701 - 05/02 0700 ?In: 2888.8 [I.V.:2396; IV Piggyback:362.8] ?Out: 1310 [Urine:800; Drains:510] ? ?Intake/Output this shift: ?No intake/output data recorded. ? ?PE: ?General: sedated ?Heart: sinus tachycardia  ?Lungs: intubated ?Abd: At least mildly distended but soft. Appropriate ttp.  Gastrostomy in place with bilious drainage in canister. Drain in LLQ with purulent-feculent looking material. Stoma pink budded and viable. Ostomy bag w/ some thin liquid stool. Eakins pouch with feculent drainage ? ?Lab Results:  ?Recent Labs  ?  11/07/21 ?1832 11/08/21 ?0533  ?WBC 13.5* 14.5*  ?HGB 8.2* 7.1*  ?HCT 26.8* 22.3*  ?PLT 386 373  ? ?BMET ?Recent Labs  ?  11/07/21 ?1919 11/08/21 ?0533  ?NA 142 139  ?K 4.5 4.5  ?CL 109 108  ?CO2 26 25  ?GLUCOSE 154* 146*  ?BUN 73* 73*  ?CREATININE 1.67* 1.55*  ?CALCIUM 8.3* 8.1*  ? ?PT/INR ?No results for input(s): LABPROT, INR in the last 72 hours. ?ABG ?Recent Labs  ?  11/07/21 ?1835 11/07/21 ?1958  ?PHART 7.29* 7.34*  ?HCO3 23.6 25.2  ? ? ?Studies/Results: ?DG CHEST PORT 1 VIEW ? ?Result Date: 11/07/2021 ?CLINICAL DATA:  Status post intubation. EXAM: PORTABLE CHEST 1 VIEW COMPARISON:  Nov 07, 2021 (2:28 p.m.) FINDINGS: There is stable left-sided PICC line positioning. Interval endotracheal tube placement is noted with its distal tip approximally 3.7 cm from the carina. Persistent atelectasis is seen within the left lung base, with a small, stable left pleural effusion. The  right lung remains clear. No pneumothorax is identified. The heart size and mediastinal contours are within normal limits. The visualized skeletal structures are unremarkable. IMPRESSION: 1. Interval endotracheal tube placement positioning, as described above. 2. Persistent left basilar atelectasis, with a small, stable left pleural effusion. Electronically Signed   By: Virgina Norfolk M.D.   On: 11/07/2021 19:30  ? ?DG Chest Port 1 View ? ?Result Date: 11/07/2021 ?CLINICAL DATA:  Hypoxia. EXAM: PORTABLE CHEST 1 VIEW COMPARISON:  November 01, 2021. FINDINGS: The heart size and mediastinal contours are within normal limits. Right lung is clear. Mild left basilar atelectasis or infiltrate is noted with associated pleural effusion. Left-sided PICC line is noted with tip in expected position of right atrium. There appears to be the interval development of comminuted right proximal humeral fracture. IMPRESSION: Mild left basilar atelectasis or infiltrate is noted with associated pleural effusion. Probable interval development of comminuted right proximal humeral fracture. Electronically Signed   By: Marijo Conception M.D.   On: 11/07/2021 14:54   ? ?Anti-infectives: ?Anti-infectives (From admission, onward)  ? ? Start     Dose/Rate Route Frequency Ordered Stop  ? 11/04/21 1000  piperacillin-tazobactam (ZOSYN) IVPB 3.375 g       ? 3.375 g ?12.5 mL/hr over 240 Minutes Intravenous Every 8 hours 11/04/21 0935 11/11/21 1759  ? 10/27/21 1600  cefTRIAXone (ROCEPHIN) 2 g in sodium chloride 0.9 % 100 mL IVPB  Status:  Discontinued       ?  2 g ?200 mL/hr over 30 Minutes Intravenous Every 24 hours 10/27/21 0843 10/27/21 1106  ? 10/27/21 1600  piperacillin-tazobactam (ZOSYN) IVPB 3.375 g       ? 3.375 g ?12.5 mL/hr over 240 Minutes Intravenous Every 8 hours 10/27/21 1106 11/03/21 2000  ? 10/27/21 1400  metroNIDAZOLE (FLAGYL) IVPB 500 mg  Status:  Discontinued       ? 500 mg ?100 mL/hr over 60 Minutes Intravenous Every 12 hours 10/27/21  0843 10/27/21 1106  ? 10/24/21 1600  piperacillin-tazobactam (ZOSYN) IVPB 3.375 g  Status:  Discontinued       ? 3.375 g ?12.5 mL/hr over 240 Minutes Intravenous Every 8 hours 10/24/21 1422 10/27/21 0843  ? 10/24/21 1000  cefTRIAXone (ROCEPHIN) 2 g in sodium chloride 0.9 % 100 mL IVPB  Status:  Discontinued       ? 2 g ?200 mL/hr over 30 Minutes Intravenous Every 24 hours 10/24/21 0747 10/24/21 1357  ? 10/23/21 1530  vancomycin (VANCOCIN) IVPB 1000 mg/200 mL premix       ? 1,000 mg ?200 mL/hr over 60 Minutes Intravenous  Once 10/23/21 1439 10/23/21 1547  ? 10/23/21 0200  ceFEPIme (MAXIPIME) 2 g in sodium chloride 0.9 % 100 mL IVPB  Status:  Discontinued       ? 2 g ?200 mL/hr over 30 Minutes Intravenous Every 12 hours 10/22/21 2032 10/24/21 0747  ? 10/22/21 2200  cefOXitin (MEFOXIN) 2 g in sodium chloride 0.9 % 100 mL IVPB  Status:  Discontinued       ? 2 g ?200 mL/hr over 30 Minutes Intravenous Every 8 hours 10/22/21 1706 10/22/21 2032  ? 10/22/21 1956  vancomycin variable dose per unstable renal function (pharmacist dosing)  Status:  Discontinued       ?  Does not apply See admin instructions 10/22/21 1957 10/23/21 1917  ? 10/22/21 1245  vancomycin (VANCOCIN) IVPB 1000 mg/200 mL premix       ? 1,000 mg ?200 mL/hr over 60 Minutes Intravenous  Once 10/22/21 1241 10/22/21 1402  ? 10/22/21 1245  ceFEPIme (MAXIPIME) 2 g in sodium chloride 0.9 % 100 mL IVPB       ? 2 g ?200 mL/hr over 30 Minutes Intravenous  Once 10/22/21 1241 10/22/21 1321  ? 10/22/21 1230  metroNIDAZOLE (FLAGYL) IVPB 500 mg  Status:  Discontinued       ? 500 mg ?100 mL/hr over 60 Minutes Intravenous Every 12 hours 10/22/21 1222 10/24/21 1357  ? ?  ? ? ?Assessment/Plan: ?POD#17 - PERFORATED SIGMOID COLON - status post EX LAP, HARTMAN'S PROCEDURE, Gastrostomy tube placement - 11/01/9561 Dr. Leighton Ruff ? OR FINDINGS: Purulent ascites throughout the abdomen and stool contamination in the pelvis due to necrotic perforated sigmoid colon ?- Status post  drainage of LLQ fluid collection by IR - 4/22, Cx with pseudomonas ?- Midline wound with feculent appearing drainage - appears  to be consistent with EC vs CC fistula, con't Eakins pouch ?- NPO cont TNA / ABX  ?- CT shows pelvic fluid collection IR asked to comment - no IV contrast so not clear if this can be drained.  Pt will likely require another CT with IV contrast with ARF is resolved. ?- G-tube to LIWS and TPN ?- WOCN following for colostomy care ?  ?  ?FEN - g-tube to LIWS, TPN for severe protein calorie malnutrition ?VTE - SCDs, SQH ?ID - zosyn 4/17 - 4/27; 4/28 >>  ?  ?- below per CCM-  ?Tongue  edema - SLP following  ?Klebsiella oxytoca bacteremia - repeat blood cxs 4/24 negative  ?Protein calorie malnutrition - continue TPN for now ?AKI - Cr 1.55 , UOP good  ?HTN ?Tobacco use ?Etoh use w/ hx of DT's ?Hx R humerus fx - on xray 3/13. Has seen Dr. Sammuel Hines in the office on 3/20 w/ plans for non-op management, NWB x 1 month and reassessment   ? ? LOS: 17 days  ? ? ?Ralene Ok ?11/08/2021 ? ?

## 2021-11-08 NOTE — Progress Notes (Signed)
? ? ?Referring Physician(s): ?Gross,S ? ?Supervising Physician: Michaelle Birks ? ?Patient Status:  [facility] - In-pt ? ?Chief Complaint: ?Abdominal abscess ? ? ?Subjective: ?Pt now intubated/sedated; afebrile; BP ok; minimal output from LLQ drain ? ? ?Allergies: ?Patient has no known allergies. ? ?Medications: ?Prior to Admission medications   ?Medication Sig Start Date End Date Taking? Authorizing Provider  ?fluticasone (FLONASE) 50 MCG/ACT nasal spray Place 1-2 sprays into both nostrils daily as needed for allergies or rhinitis.   Yes [provider]  ?loratadine (CLARITIN) 10 MG tablet Take 10 mg by mouth daily as needed for allergies.   Yes [provider]  ?Multiple Vitamins-Minerals (MULTIVITAMIN ADULTS 50+) TABS Take 1 tablet by mouth every morning.   Yes [provider]  ?ondansetron (ZOFRAN) 8 MG tablet Take 8 mg by mouth daily as needed for nausea/vomiting. 10/10/21  Yes [provider]  ?cetirizine (ZYRTEC) 10 MG tablet Take 10 mg by mouth daily as needed for allergies.    [provider]  ?diclofenac Sodium (VOLTAREN) 1 % GEL Apply 4 g topically 4 (four) times daily. ?Patient not taking: Reported on 10/22/2021 09/21/21   Deno Etienne, DO  ?mirtazapine (REMERON SOL-TAB) 15 MG disintegrating tablet Take 1 tablet (15 mg total) by mouth at bedtime. 12/12/17 01/11/18  Arrien, Jimmy Picket, MD  ?morphine (MSIR) 15 MG tablet Take 0.5 tablets (7.5 mg total) by mouth every 4 (four) hours as needed for severe pain. ?Patient not taking: Reported on 10/22/2021 09/26/21   Vanetta Mulders, MD  ? ? ? ?Vital Signs: ?BP 136/69   Pulse (!) 110   Temp 98.7 ?F (37.1 ?C) (Oral)   Resp 20   Ht 5' 10.5" (1.791 m)   Wt 119 lb 7.8 oz (54.2 kg)   SpO2 100%   BMI 16.90 kg/m?  ? ?Physical Exam: intubated, sedated; LLQ drain intact, OP 10 cc yellow fluid; drain flushed but with minimal to no return ? ?Imaging: ?CT ABDOMEN PELVIS WO CONTRAST ? ?Result Date: 11/04/2021 ?CLINICAL DATA:  Postop  exploratory laparotomy and Hartmann's pouch procedure. EXAM: CT ABDOMEN AND PELVIS WITHOUT CONTRAST TECHNIQUE: Multidetector CT imaging of the abdomen and pelvis was performed following the standard protocol without IV contrast. RADIATION DOSE REDUCTION: This exam was performed according to the departmental dose-optimization program which includes automated exposure control, adjustment of the mA and/or kV according to patient size and/or use of iterative reconstruction technique. COMPARISON:  10/28/2021 FINDINGS: Lower chest: Small left pleural effusion with left lower lobe airspace consolidation/atelectasis. Hepatobiliary: No focal liver abnormality identified. Gallstone measures 5 mm. Mild gallbladder wall edema is identified, image 31/2. No bile duct dilatation. Pancreas: Unremarkable. No pancreatic ductal dilatation or surrounding inflammatory changes. Spleen: Normal in size without focal abnormality. Adrenals/Urinary Tract: Normal adrenal glands. No kidney mass, nephrolithiasis or hydronephrosis. Small foci of gas noted within the bladder. Bladder otherwise unremarkable. Stomach/Bowel: Gastrostomy tube is identified within the stomach. Evaluation of bowel pathology is significantly limited due to lack of IV contrast material and lack of enteric contrast material within the pelvic bowel loops. The small bowel loops are upper limits of normal in caliber measuring 3 cm which may reflect postoperative ileus. This is improved when compared with 10/28/2021 when the small bowel loops measured up to 5.1 cm. There is a left lower quadrant colostomy. Hartmann's procedure has been performed. Vascular/Lymphatic: Aortic atherosclerosis. No aneurysm. Within the limitations of unenhanced technique no abdominopelvic adenopathy identified. Reproductive: No mass identified. Other: There is a percutaneous pigtail drainage catheter which enters  from a left lower quadrant approach with pigtail terminating in the ventral pelvis just  right of the midline, image 68/2. The margins of the previously noted anterior pelvic fluid collection are difficult to visualize separate from unopacified bowel loops. Within this limitation there appears to be decreased volume of the previously noted fluid within the ventral pelvis. The surgically placed JP drain within the posterior pelvis has been removed. Within the cul-de-sac, there is a fluid collection measuring 6.6 x 3.4 cm, image 68/2. Maturity of this fluid collection and suitability for drainage is difficult to assess due to lack of IV contrast. No additional suspicious or new fluid collections identified within the abdomen or pelvis. A few foci of intraperitoneal gas are identified, likely reflecting post up change. Musculoskeletal: No acute or significant osseous findings. Unchanged compression fracture at the L2 level. Severe degenerative disc disease noted at L3-4. IMPRESSION: 1. Evaluation of bowel pathology is significantly limited due to lack of IV contrast material and lack of enteric contrast material within the pelvic bowel loops. 2. There is a new fluid collection within the cul-de-sac measuring 6.6 x 3.4 cm. Maturity of this fluid collection and suitability for drainage is difficult to assess due to lack of IV contrast. 3. Unchanged position of percutaneous pigtail drainage catheter which terminates in the right ventral pelvis. Previously noted fluid collection within the anterior pelvis is difficult to reassess due to factors described above. Within this limitation it appears that there has been decrease in the volume ventral fluid collection however. This is difficult to quantify however due to lack of IV contrast and presence of unopacified bowel loops in the ventral pelvis/lower abdomen. 4. Improved appearance of small bowel distension consistent with resolving small-bowel ileus 5. Small left pleural effusion with left lower lobe airspace consolidation/atelectasis. 6. Gallstone with mild  gallbladder wall edema. 7. Aortic Atherosclerosis (ICD10-I70.0). Electronically Signed   By: Kerby Moors M.D.   On: 11/04/2021 11:36  ? ?DG CHEST PORT 1 VIEW ? ?Result Date: 11/07/2021 ?CLINICAL DATA:  Status post intubation. EXAM: PORTABLE CHEST 1 VIEW COMPARISON:  Nov 07, 2021 (2:28 p.m.) FINDINGS: There is stable left-sided PICC line positioning. Interval endotracheal tube placement is noted with its distal tip approximally 3.7 cm from the carina. Persistent atelectasis is seen within the left lung base, with a small, stable left pleural effusion. The right lung remains clear. No pneumothorax is identified. The heart size and mediastinal contours are within normal limits. The visualized skeletal structures are unremarkable. IMPRESSION: 1. Interval endotracheal tube placement positioning, as described above. 2. Persistent left basilar atelectasis, with a small, stable left pleural effusion. Electronically Signed   By: Virgina Norfolk M.D.   On: 11/07/2021 19:30  ? ?DG Chest Port 1 View ? ?Result Date: 11/07/2021 ?CLINICAL DATA:  Hypoxia. EXAM: PORTABLE CHEST 1 VIEW COMPARISON:  November 01, 2021. FINDINGS: The heart size and mediastinal contours are within normal limits. Right lung is clear. Mild left basilar atelectasis or infiltrate is noted with associated pleural effusion. Left-sided PICC line is noted with tip in expected position of right atrium. There appears to be the interval development of comminuted right proximal humeral fracture. IMPRESSION: Mild left basilar atelectasis or infiltrate is noted with associated pleural effusion. Probable interval development of comminuted right proximal humeral fracture. Electronically Signed   By: Marijo Conception M.D.   On: 11/07/2021 14:54   ? ?Labs: ? ?CBC: ?Recent Labs  ?  11/06/21 ?8676 11/07/21 ?0500 11/07/21 ?1832 11/08/21 ?0533  ?WBC 9.6  9.6 13.5* 14.5*  ?HGB 8.2* 7.4* 8.2* 7.1*  ?HCT 25.4* 23.5* 26.8* 22.3*  ?PLT 283 312 386 373  ? ? ?COAGS: ?Recent Labs  ?   10/22/21 ?2119 10/29/21 ?0650 10/30/21 ?0337  ?INR 1.3* 1.2 1.2  ? ? ?BMP: ?Recent Labs  ?  11/06/21 ?3567 11/07/21 ?0141 11/07/21 ?1919 11/08/21 ?0533  ?NA 152* 140 142 139  ?K 3.5 4.3 4.5 4.5  ?CL 118* 111 109 108  ?

## 2021-11-09 ENCOUNTER — Encounter (HOSPITAL_COMMUNITY): Payer: Self-pay | Admitting: Student

## 2021-11-09 DIAGNOSIS — T8143XA Infection following a procedure, organ and space surgical site, initial encounter: Secondary | ICD-10-CM | POA: Diagnosis present

## 2021-11-09 DIAGNOSIS — A419 Sepsis, unspecified organism: Secondary | ICD-10-CM

## 2021-11-09 DIAGNOSIS — K632 Fistula of intestine: Secondary | ICD-10-CM | POA: Diagnosis present

## 2021-11-09 LAB — COMPREHENSIVE METABOLIC PANEL
ALT: 18 U/L (ref 0–44)
AST: 18 U/L (ref 15–41)
Albumin: 1.9 g/dL — ABNORMAL LOW (ref 3.5–5.0)
Alkaline Phosphatase: 86 U/L (ref 38–126)
Anion gap: 7 (ref 5–15)
BUN: 79 mg/dL — ABNORMAL HIGH (ref 6–20)
CO2: 24 mmol/L (ref 22–32)
Calcium: 8.3 mg/dL — ABNORMAL LOW (ref 8.9–10.3)
Chloride: 108 mmol/L (ref 98–111)
Creatinine, Ser: 1.73 mg/dL — ABNORMAL HIGH (ref 0.44–1.00)
GFR, Estimated: 33 mL/min — ABNORMAL LOW (ref >60)
Glucose, Bld: 121 mg/dL — ABNORMAL HIGH (ref 70–99)
Potassium: 4.3 mmol/L (ref 3.5–5.1)
Sodium: 139 mmol/L (ref 135–145)
Total Bilirubin: 0.7 mg/dL (ref 0.3–1.2)
Total Protein: 6.4 g/dL — ABNORMAL LOW (ref 6.5–8.1)

## 2021-11-09 LAB — GLUCOSE, CAPILLARY
Glucose-Capillary: 114 mg/dL — ABNORMAL HIGH (ref 70–99)
Glucose-Capillary: 119 mg/dL — ABNORMAL HIGH (ref 70–99)
Glucose-Capillary: 107 mg/dL — ABNORMAL HIGH (ref 70–99)
Glucose-Capillary: 96 mg/dL (ref 70–99)
Glucose-Capillary: 117 mg/dL — ABNORMAL HIGH (ref 70–99)

## 2021-11-09 LAB — CBC
HCT: 20.7 % — ABNORMAL LOW (ref 36.0–46.0)
Hemoglobin: 6.6 g/dL — CL (ref 12.0–15.0)
MCH: 29.2 pg (ref 26.0–34.0)
MCHC: 31.9 g/dL (ref 30.0–36.0)
MCV: 91.6 fL (ref 80.0–100.0)
Platelets: 376 10*3/uL (ref 150–400)
RBC: 2.26 MIL/uL — ABNORMAL LOW (ref 3.87–5.11)
RDW: 16 % — ABNORMAL HIGH (ref 11.5–15.5)
WBC: 13 10*3/uL — ABNORMAL HIGH (ref 4.0–10.5)
nRBC: 0 % (ref 0.0–0.2)

## 2021-11-09 LAB — MAGNESIUM: Magnesium: 2.1 mg/dL (ref 1.7–2.4)

## 2021-11-09 LAB — HEMOGLOBIN AND HEMATOCRIT, BLOOD
HCT: 25.4 % — ABNORMAL LOW (ref 36.0–46.0)
Hemoglobin: 8.3 g/dL — ABNORMAL LOW (ref 12.0–15.0)

## 2021-11-09 LAB — PHOSPHORUS: Phosphorus: 4.6 mg/dL (ref 2.5–4.6)

## 2021-11-09 LAB — PREPARE RBC (CROSSMATCH)

## 2021-11-09 LAB — TRIGLYCERIDES: Triglycerides: UNDETERMINED mg/dL (ref <150)

## 2021-11-09 MED ORDER — LORAZEPAM 2 MG/ML IJ SOLN
0.5000 mg | Freq: Four times a day (QID) | INTRAMUSCULAR | Status: DC | PRN
  Administered 2021-11-16 – 2021-11-28 (×24): 0.5 mg via INTRAVENOUS
  Filled 2021-11-09 (×24): qty 1

## 2021-11-09 MED ORDER — SODIUM CHLORIDE 0.9% IV SOLUTION: Freq: Once | INTRAVENOUS | Status: AC

## 2021-11-09 MED ORDER — FENTANYL CITRATE PF 50 MCG/ML IJ SOSY
12.5000 ug | PREFILLED_SYRINGE | INTRAMUSCULAR | Status: DC | PRN
  Administered 2021-11-10 – 2021-11-28 (×53): 12.5 ug via INTRAVENOUS
  Filled 2021-11-09 (×57): qty 1

## 2021-11-09 MED ORDER — TRAVASOL 10 % IV SOLN
INTRAVENOUS | Status: AC
  Filled 2021-11-09: qty 1113.6

## 2021-11-09 MED ORDER — TRAVASOL 10 % IV SOLN
INTRAVENOUS | Status: DC
  Filled 2021-11-09: qty 1113.6

## 2021-11-09 MED ORDER — ORAL CARE MOUTH RINSE
15.0000 mL | Freq: Two times a day (BID) | OROMUCOSAL | Status: DC
  Administered 2021-11-09 – 2021-11-11 (×4): 15 mL via OROMUCOSAL

## 2021-11-09 NOTE — Progress Notes (Signed)
? ?Progress Note ? ?18 Days Post-Op  ?Subjective: ?On the vent. Eakin's in place and appears to be working. LLQ drain more serous now. No ostomy output.  ? ?Objective: ?Vital signs in last 24 hours: ?Temp:  [99 ?F (37.2 ?C)-100.9 ?F (38.3 ?C)] 99.7 ?F (37.6 ?C) (05/03 0350) ?Pulse Rate:  [92-121] 92 (05/03 0700) ?Resp:  [12-23] 21 (05/03 0700) ?BP: (87-144)/(41-104) 129/69 (05/03 0700) ?SpO2:  [97 %-100 %] 100 % (05/03 0700) ?FiO2 (%):  [30 %-40 %] 30 % (05/03 0757) ?Weight:  [54.1 kg] 54.1 kg (05/03 0500) ?Last BM Date : 11/07/21 ? ?Intake/Output from previous day: ?05/02 0701 - 05/03 0700 ?In: 3076.4 [I.V.:2941.3; IV Piggyback:135] ?Out: 4403 [Urine:1500; KVQQVZ:563] ?Intake/Output this shift: ?No intake/output data recorded. ? ?PE: ?General: alert, calm  ?Heart: sinus tachycardia  ?Lungs: intubated ?Abd: At least mildly distended but soft. Appropriate ttp.  Gastrostomy in place with bilious drainage in canister. Drain in LLQ with scant serous looking fluid. Stoma pink budded and viable. Ostomy bag w/ nothing in it this AM. Eakins pouch with feculent drainage  ? ? ?Lab Results:  ?Recent Labs  ?  11/08/21 ?8756 11/09/21 ?0258  ?WBC 14.5* 13.0*  ?HGB 7.1* 6.6*  ?HCT 22.3* 20.7*  ?PLT 373 376  ? ?BMET ?Recent Labs  ?  11/08/21 ?4332 11/09/21 ?0258  ?NA 139 139  ?K 4.5 4.3  ?CL 108 108  ?CO2 25 24  ?GLUCOSE 146* 121*  ?BUN 73* 79*  ?CREATININE 1.55* 1.73*  ?CALCIUM 8.1* 8.3*  ? ?PT/INR ?No results for input(s): LABPROT, INR in the last 72 hours. ?CMP  ?   ?Component Value Date/Time  ? NA 139 11/09/2021 0258  ? K 4.3 11/09/2021 0258  ? CL 108 11/09/2021 0258  ? CO2 24 11/09/2021 0258  ? GLUCOSE 121 (H) 11/09/2021 0258  ? BUN 79 (H) 11/09/2021 0258  ? CREATININE 1.73 (H) 11/09/2021 0258  ? CALCIUM 8.3 (L) 11/09/2021 0258  ? PROT 6.4 (L) 11/09/2021 0258  ? ALBUMIN 1.9 (L) 11/09/2021 0258  ? AST 18 11/09/2021 0258  ? ALT 18 11/09/2021 0258  ? ALKPHOS 86 11/09/2021 0258  ? BILITOT 0.7 11/09/2021 0258  ? GFRNONAA 33 (L)  11/09/2021 0258  ? GFRAA >60 12/12/2017 0433  ? ?Lipase  ?   ?Component Value Date/Time  ? LIPASE 27 10/22/2021 1141  ? ? ? ? ? ?Studies/Results: ?DG CHEST PORT 1 VIEW ? ?Result Date: 11/07/2021 ?CLINICAL DATA:  Status post intubation. EXAM: PORTABLE CHEST 1 VIEW COMPARISON:  Nov 07, 2021 (2:28 p.m.) FINDINGS: There is stable left-sided PICC line positioning. Interval endotracheal tube placement is noted with its distal tip approximally 3.7 cm from the carina. Persistent atelectasis is seen within the left lung base, with a small, stable left pleural effusion. The right lung remains clear. No pneumothorax is identified. The heart size and mediastinal contours are within normal limits. The visualized skeletal structures are unremarkable. IMPRESSION: 1. Interval endotracheal tube placement positioning, as described above. 2. Persistent left basilar atelectasis, with a small, stable left pleural effusion. Electronically Signed   By: Virgina Norfolk M.D.   On: 11/07/2021 19:30   ? ?Anti-infectives: ?Anti-infectives (From admission, onward)  ? ? Start     Dose/Rate Route Frequency Ordered Stop  ? 11/04/21 1000  piperacillin-tazobactam (ZOSYN) IVPB 3.375 g       ? 3.375 g ?12.5 mL/hr over 240 Minutes Intravenous Every 8 hours 11/04/21 0935 11/11/21 1759  ? 10/27/21 1600  cefTRIAXone (ROCEPHIN) 2 g in sodium  chloride 0.9 % 100 mL IVPB  Status:  Discontinued       ? 2 g ?200 mL/hr over 30 Minutes Intravenous Every 24 hours 10/27/21 0843 10/27/21 1106  ? 10/27/21 1600  piperacillin-tazobactam (ZOSYN) IVPB 3.375 g       ? 3.375 g ?12.5 mL/hr over 240 Minutes Intravenous Every 8 hours 10/27/21 1106 11/03/21 2000  ? 10/27/21 1400  metroNIDAZOLE (FLAGYL) IVPB 500 mg  Status:  Discontinued       ? 500 mg ?100 mL/hr over 60 Minutes Intravenous Every 12 hours 10/27/21 0843 10/27/21 1106  ? 10/24/21 1600  piperacillin-tazobactam (ZOSYN) IVPB 3.375 g  Status:  Discontinued       ? 3.375 g ?12.5 mL/hr over 240 Minutes Intravenous Every  8 hours 10/24/21 1422 10/27/21 0843  ? 10/24/21 1000  cefTRIAXone (ROCEPHIN) 2 g in sodium chloride 0.9 % 100 mL IVPB  Status:  Discontinued       ? 2 g ?200 mL/hr over 30 Minutes Intravenous Every 24 hours 10/24/21 0747 10/24/21 1357  ? 10/23/21 1530  vancomycin (VANCOCIN) IVPB 1000 mg/200 mL premix       ? 1,000 mg ?200 mL/hr over 60 Minutes Intravenous  Once 10/23/21 1439 10/23/21 1547  ? 10/23/21 0200  ceFEPIme (MAXIPIME) 2 g in sodium chloride 0.9 % 100 mL IVPB  Status:  Discontinued       ? 2 g ?200 mL/hr over 30 Minutes Intravenous Every 12 hours 10/22/21 2032 10/24/21 0747  ? 10/22/21 2200  cefOXitin (MEFOXIN) 2 g in sodium chloride 0.9 % 100 mL IVPB  Status:  Discontinued       ? 2 g ?200 mL/hr over 30 Minutes Intravenous Every 8 hours 10/22/21 1706 10/22/21 2032  ? 10/22/21 1956  vancomycin variable dose per unstable renal function (pharmacist dosing)  Status:  Discontinued       ?  Does not apply See admin instructions 10/22/21 1957 10/23/21 1917  ? 10/22/21 1245  vancomycin (VANCOCIN) IVPB 1000 mg/200 mL premix       ? 1,000 mg ?200 mL/hr over 60 Minutes Intravenous  Once 10/22/21 1241 10/22/21 1402  ? 10/22/21 1245  ceFEPIme (MAXIPIME) 2 g in sodium chloride 0.9 % 100 mL IVPB       ? 2 g ?200 mL/hr over 30 Minutes Intravenous  Once 10/22/21 1241 10/22/21 1321  ? 10/22/21 1230  metroNIDAZOLE (FLAGYL) IVPB 500 mg  Status:  Discontinued       ? 500 mg ?100 mL/hr over 60 Minutes Intravenous Every 12 hours 10/22/21 1222 10/24/21 1357  ? ?  ? ? ? ?Assessment/Plan ?POD#18 - PERFORATED SIGMOID COLON - status post EX LAP, HARTMAN'S PROCEDURE, Gastrostomy tube placement - 10/05/9240 Dr. Leighton Ruff ? 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 No longer having output from stoma so seems likely proximal to this but CT with PO contrast would be helpful to try to localize ?- NPO cont TNA / ABX  ?- CT shows pelvic fluid collection IR asked to comment - no IV contrast so not clear if this can be drained.  Pt will likely require another CT with IV and PO contrast with ARF is resolved. ?- G-tube to LIWS and TPN ?-  WOCN following for colostomy care and pouching ?Acute hypoxic respiratory failure in setting of shock ?septic - likely secondary to above, Vent and pressors per CCM ? ?FEN - g-tube to LIWS, TPN for severe protein calorie malnutrition ?VTE - SCDs, SQH ?ID - zosyn 4/17 - 4/27; 4/28 >>  ?  ?- below per CCM-  ?Tongue edema - SLP following  ?Klebsiella oxytoca bacteremia - repeat blood cxs 4/24 negative  ?Protein calorie malnutrition - continue TPN for now ?AKI - Cr 1.73 from 1.55 this AM, UOP good  ?HTN ?Tobacco use ?Etoh use w/ hx of DT's ?Hx R humerus fx - on xray 3/13. Has seen Dr. Sammuel Hines in the office on 3/20 w/ plans for non-op management, NWB x 1 month and reassessment    ? LOS: 18 days  ? ? ? ?Norm Parcel, PA-C ?[facility] ?11/09/2021, 8:56 AM ?Please see Amion for pager number during day hours 7:00am-4:30pm ? ?

## 2021-11-09 NOTE — Progress Notes (Signed)
PT Cancellation Note ? ?Patient Details ?Name: Jessica [name] ?MRN: [id number] ?DOB: 1960/12/18 ? ? ?Cancelled Treatment:    Reason Eval/Treat Not Completed: Medical issues which prohibited therapy (Pt intubated 11/07/21. Hgb 6.6 this morning. Will follow.) ? ?Blondell Reveal Kistler PT 11/09/2021  ?Acute Rehabilitation Services ?Pager [phone] ?Office [phone] ? ?

## 2021-11-09 NOTE — Progress Notes (Addendum)
? ?NAME:  Jessica [name], MRN:  [id number], DOB:  01-13-1961, LOS: 48 ?ADMISSION DATE:  10/22/2021, CONSULTATION DATE:  10/22/21 ?REFERRING MD:  Francia Greaves, CHIEF COMPLAINT:  abdominal pain  ? ?History of Present Illness:  ?[name] with history of alcohol use disorder with prior severe withdrawal/DT's requiring ICU admission and precedex, smoking who presented to the ED 4/15 for severe diffuse abdominal pain that began the night before admission. She had had constipation for 4d PTA. Endorsed frequent alcohol use. Last drink was day PTA.  ? ?In ED she was found to have pneumoperitoneum and free fluid on CT A/P, severe lactic acidosis. She was started on broad spectrum antibiotics and has been given a total of 5L of crystalloid. Surgery was consulted and she was taken to OR where she underwent ex lap and was found to have perforated sigmoid colon, then had hartman's procedure and insertion of g-tube which was left to gravity drainage, JP bulb in pelvis. She was noted to have feculent peritonitis in the pelvis. ICU course complicated by klebsiella oxytoca bacteremia, profound protein calorie malnutrition requiring TPN, hypoxemic respiratory failure, tachycardia and failure to progress.  She was transferred to ICU  ? ?Pertinent  Medical History  ?Etoh use disorder ?History of DT ?Smoking ? ?Significant Hospital Events: ?Including procedures, antibiotic start and stop dates in addition to other pertinent events   ?10/22/21 ex lap, hartman's procedure, g tube placement. Extubated in OR ?4/17 Reintubated overnight with continued pressor support x2. 11 liters positive thus far with progressive AKI this am  ?4/18 Continued issues with electrolyte abnormalities overnight, remains on levo and neo. She is now 14L positive but urine output has increased  ?4/19 No issues overnight, remains 14L positive but urine output continues to improve. Start to slowly diureses today  ?4/20 weaning NE, extubated, diuresed, started trickle TF, remains  on precedex for agitation  ?4/21 periods of agitation/ hallucinations, scheduled ativan helps, remains on precedex 0.7. CT A/P with suspicious for partial small bowel obstruction, with transition point in the lower central pelvis. Increased small to moderate amount of ascites, without definite focal abscess collection. Small amount of postop free air also noted. New small left pleural effusion and left lower lobe atelectasis. New diffuse body wall edema, consistent with anasarca. Cholelithiasis.  ?4/22 On  TPN.  Did not tolerated TF yesterday -vomitted and now G tube to LIS with bilios returns.  Low grade fever +. WBC now normal. On Zosyn. Hgb < 7g% without overt bleeding - 1 U PRBC. delirium worse.  ?4/24 Remains off vasopressors, overnight with agitation requiring precedex, 5 mg Haldol, 2 mg versed ?4/25 Out of bed. SLP eval declined by patient as well as OT.  ?4/26 Off precedex. Seen by palliative care > pt requested full code. No family.  ?4/27 Transferred to [place]  ?5/1 PCCM called back for AMS, intubated for declining mental status and hypoxia. Back in Septic shock, working dx on-going abd sepsis and new aspiration PNA  ?5/2 Followed by IR and surgery both concerned about on-going intrabd infection. 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 Both teams wanting contrasted film but renal fxn barrier. ?5/3 got blood. Passed SBT->extubated  ? ?Interim History / Subjective:  ?Appears comfortable.  Currently on spontaneous breathing trial with acceptable tidal volumes ? ?Objective   ?Blood pressure (Abnormal) 166/68, pulse (Abnormal) 114, temperature 99.7 ?F (37.6 ?C), temperature source Axillary, resp. rate 20, height '5\' 10"'$  (1.778 m), weight 54.1 kg, SpO2 95 %. ?   ?Vent Mode: CPAP;PSV ?FiO2 (%):  [  30 %-40 %] 30 % ?Set Rate:  [18 bmp] 18 bmp ?Vt Set:  [550 mL] 550 mL ?PEEP:  [5 cmH20] 5 cmH20 ?Pressure Support:  [4 cmH20-5 cmH20] 4 cmH20 ?Plateau Pressure:  [17 cmH20-19 cmH20] 18 cmH20  ? ?Intake/Output Summary (Last 24  hours) at 11/09/2021 0946 ?Last data filed at 11/09/2021 0759 ?Gross per 24 hour  ?Intake 3076.36 ml  ?Output 1830 ml  ?Net 1246.36 ml  ? ?Filed Weights  ? 11/07/21 0500 11/08/21 0500 11/09/21 0500  ?Weight: 54 kg 54.2 kg 54.1 kg  ? ? ?Examination: ?General: Debilitated 62 year old white female currently on spontaneous breathing trial she appears in no acute distress ?HEENT normocephalic and traumatic does have temporal wasting orally intubated ?Pulmonary: Coarse scattered rhonchi no accessory use currently on spontaneous breathing trial with acceptable tidal volumes ranging in the 500 range ?Cardiac regular rate and rhythm without murmur rub or gallop ?Abdomen and midline incision with colocutaneous fistula draining feculent drainage emptying into a pouch.  Ostomy stoma with minimal output bowel sounds present abdominal pain managed ?Extremities warm dry without significant edema ?Neuro awake moves all extremities ?GU clear yellow. ?Resolved Hospital Problem list   ?[place] ?Lactic acidosis  ?Hyponatremia  ?Hypernatremia  ?Hypocalcemia ?Transaminitis ?Hypernatremia  ?Klebsiella Oxytoca Bacteremia ?Thrombocytopenia  ?Acute abd pain  ?Assessment & Plan:  ?Principal Problem: ?  Severe sepsis due to perforated sigmoid colon s/p Hartmann/colostomy 10/22/2021 ?Active Problems: ?  Substance induced mood disorder ([place]) ?  Anxiety ?  Chronic pain ?  Primary insomnia ?  Tobacco use disorder ?  Underweight ?  Protein-calorie malnutrition, severe ([place]) ?  Colostomy in place [facility]) ?  AKI (acute kidney injury) ([place]) ?  Stricture and stenosis of esophagus ?  Gastrostomy tube in place [facility]) ?  Acute respiratory failure with hypoxia ([place]) ?  Acute metabolic encephalopathy ?  Hypernatremia, hyponatremia, hypokalemia, hyperphosphatemia ?  Hyponatremia ?  Elevated brain natriuretic peptide (BNP) level ?  Right proximal humeral fracture ?  Oropharyngeal dysphagia ?  Normocytic anemia ?  Palliative care by specialist ?  Goals of care,  counseling/discussion ?  General weakness ?  Septic shock ([place]) ?  Postoperative intra-abdominal abscess ?  Colocutaneous fistula ?  ?feculent peritonitis with sigmoid perforation, now S/p hartman's procedure, g-tube placement 4/15. Post-op complicated by colocutaneous fistula and Pseudomonas intrabd abscess which required percutaneous drainage  ?Plan ? drains per IR  ?Cont IV zosyn ?Bowel rest ?TPN started 4/19 ?CT w/ contrast when renal fxn will allow ?Wound care as directed by surgical team  ? ?Septic shock 2/2 aspiration pna and ongoing abd sepsis ?Plan ?Wean pressors ?Keep euvolemic ?Holding antihypertensives ?  ?Acute Hypoxic Respiratory Failure 2/2 aspiration PNA  ?Extubated 4/20.  Increased O2 needs, AMS ->re-intubated 5/1 ?Passed SBT ?Plan ?Extubate ?NPO ?IS ?Mobilize ?Limit sedation ? ?H/o htn ?Plan ?Holding meds  ? ?Acute Encephalopathy, multifactorial with ICU delirium, ongoing sepsis, ETOH withdrawal (should be out of window at this point) Has needed precedex before in ICU. ?Alcohol use and History of DT ?Plan ?Dc sedation  ?Low dose ativan if needed ?Low dose fent for pain  ? ?AKI 2/2 septic shock ?Plan ?Map goal > 65 ?Hold diuresis and antihypertensives ?Renal dose meds ?Strict I&O ?Am chem  ? ?Fluid and electrolyte imbalance (intermittent) ?Plan ?Monitor  ?Replace as indicated ? ? ?Normocytic Anemia c/b anemic or critical illnessNo evidence of bleeding/ clotting.  ?Plan ?Got blood today  ?Am cbc ?Trigger for transfusion < 7  ? ?Cachexia - Prior to & Present  on Admit ?Severe Protein Calorie Malnuritioin - (ablg 2,7 at admit) - moderate Prior to & Present on Admit ?Plan ?TPN ? ?Right proximal humerus fracture, pta  ?S/p fall in March 2023. Seen by ortho 09/26/21 with plans for non weightbearing of RUE x1 month and then f/u with ortho in one month.  ?Plan ?Outpt f/u w/ ortho  ? ?Best Practice (right click and "Reselect all SmartList Selections" daily)  ?Diet/type: NPO TPN ?DVT prophylaxis: prophylactic  heparin  ?GI prophylaxis: PPI ?Lines: Central line on TPN ?Foley:  N/A ?Code Status:  full code ?Last date of multidisciplinary goals of care discussion: Continue to update family daily. No family other than bo

## 2021-11-09 NOTE — Progress Notes (Signed)
Physical Therapy Treatment ?Patient Details ?Name: Jessica [name] ?MRN: [id number] ?DOB: April 06, 1961 ?Today's Date: 11/09/2021 ? ? 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  Pt intubated post op and extubated 10/27/21. Re Intubated 11/07/21.   Pt with hx of ETOH abuse and recent R shoulder fx (09/20/21) with X ray from 10/27/21 indicating fx still present. ? ?  ?PT Comments  ? ? +2 max assist to roll and for sidelying to sit, max verbal cues for technique. Pt sat at edge of bed x 10 minutes with varying amounts of assistance for sitting balance, at times she needed mod assist 2* posterior lean, at times she could briefly maintain her trunk in neutral. Pt performed forward reaching, but could not reach over her base of support, and performed seated knee extension AROM. Activity tolerance limited by pain. SaO2 97% on room air with activity.  ?   ?Recommendations for follow up therapy are one component of a multi-disciplinary discharge planning process, led by the attending physician.  Recommendations may be updated based on patient status, additional functional criteria and insurance authorization. ? ?Follow Up Recommendations ? Skilled nursing-short term rehab (<3 hours/day) ?  ?  ?Assistance Recommended at Discharge Frequent or constant Supervision/Assistance  ?Patient can return home with the following Two people to help with walking and/or transfers;A lot of help with bathing/dressing/bathroom;Assistance with cooking/housework;Assist for transportation;Help with stairs or ramp for entrance ?  ?Equipment Recommendations ? None recommended by PT  ?  ?Recommendations for Other Services OT consult ? ? ?  ?Precautions / Restrictions Precautions ?Precautions: Fall ?Precaution Comments: multiple lines--> L JP drain, gastrostomy tube, triple lumen central line ?Restrictions ?Weight Bearing Restrictions: No ?RUE Weight Bearing: Non weight  bearing ?Other Position/Activity Restrictions: recent R UE/shoulder fx - assume NWB, though per chart review RUE WB status expired on 10/27/21.  ?  ? ?Mobility ? Bed Mobility ?Overal bed mobility: Needs Assistance ?Bed Mobility: Rolling, Sidelying to Sit ?Rolling: Max assist, +2 for physical assistance ?Sidelying to sit: Max assist, +2 for physical assistance ?  ?  ?  ?General bed mobility comments: max A +2 with use of bed pad roll L then sidelying to sit ?  ? ?Transfers ?  ?  ?  ?  ?  ?  ?  ?  ?  ?General transfer comment: Deferred 2* limited sitting balance ?  ? ?Ambulation/Gait ?  ?  ?  ?  ?  ?  ?  ?  ? ? ?Stairs ?  ?  ?  ?  ?  ? ? ?Wheelchair Mobility ?  ? ?Modified Rankin (Stroke Patients Only) ?  ? ? ?  ?Balance Overall balance assessment: Needs assistance ?Sitting-balance support: Feet supported, No upper extremity supported ?Sitting balance-Leahy Scale: Poor ?Sitting balance - Comments: Patient able to sit EOB with balls of feet on floor and maintain sitting balance with mod A 2* frequent lean posteriorly, able to briefly bring trunk to neutral,  10 minutes seated EOB with seated knee extension AROM. Attempted forward reaching, pt not able to reach over base of support. ?Postural control: Posterior lean ?  ?  ?  ?  ?  ?  ?  ?  ?  ?  ?  ?  ?  ?  ?  ? ?  ?Cognition Arousal/Alertness: Awake/alert ?Behavior During Therapy: [facility] for tasks assessed/performed ?Overall Cognitive Status: Difficult to assess ?Area of Impairment: Orientation, Attention, Following commands, Problem  solving, Awareness, Safety/judgement ?  ?  ?  ?  ?  ?  ?  ?  ?Orientation Level: Disoriented to, Situation, Time ?Current Attention Level: Focused ?  ?Following Commands: Follows multi-step commands inconsistently, Follows one step commands consistently ?  ?  ?Problem Solving: Decreased initiation, Difficulty sequencing, Requires verbal cues, Slow processing, Requires tactile cues ?General Comments: participates with multi-modal cues, pt's  speech is slurred with low volume, very difficult to understand ?  ?  ? ?  ?Exercises General Exercises - Lower Extremity ?Long Arc Quad: AROM, AAROM, Strengthening, Both, Seated, 10 reps ? ?  ?General Comments   ?  ?  ? ?Pertinent Vitals/Pain Pain Assessment ?Faces Pain Scale: Hurts little more ?Pain Location: Abdomen with movement ?Pain Descriptors / Indicators: Discomfort, Grimacing ?Pain Intervention(s): Limited activity within patient's tolerance, Monitored during session, Repositioned  ? ? ?Home Living   ?  ?  ?  ?  ?  ?  ?  ?  ?  ?   ?  ?Prior Function    ?  ?  ?   ? ?PT Goals (current goals can now be found in the care plan section) Acute Rehab PT Goals ?Patient Stated Goal: Lie back down ?PT Goal Formulation: With patient ?Time For Goal Achievement: 11/11/21 ?Potential to Achieve Goals: Fair ?Progress towards PT goals: Progressing toward goals ? ?  ?Frequency ? ? ? Min 2X/week ? ? ? ?  ?PT Plan Current plan remains appropriate  ? ? ?Co-evaluation   ?  ?  ?  ?  ? ?  ?AM-PAC PT "6 Clicks" Mobility   ?Outcome Measure ? Help needed turning from your back to your side while in a flat bed without using bedrails?: Total ?Help needed moving from lying on your back to sitting on the side of a flat bed without using bedrails?: Total ?Help needed moving to and from a bed to a chair (including a wheelchair)?: Total ?Help needed standing up from a chair using your arms (e.g., wheelchair or bedside chair)?: Total ?Help needed to walk in hospital room?: Total ?Help needed climbing 3-5 steps with a railing? : Total ?6 Click Score: 6 ? ?  ?End of Session   ?Activity Tolerance: Patient limited by fatigue ?Patient left: in bed;with call bell/phone within reach;with bed alarm set;Other (comment) ?Nurse Communication: Mobility status;Need for lift equipment ?PT Visit Diagnosis: Muscle weakness (generalized) (M62.81);History of falling (Z91.81);Difficulty in walking, not elsewhere classified (R26.2);Pain ?  ? ? ?Time:  3818-2993 ?PT Time Calculation (min) (ACUTE ONLY): 18 min ? ?Charges:  $Therapeutic Activity: 8-22 mins          ?          ? ?Philomena Doheny PT 11/09/2021  ?Acute Rehabilitation Services ?Pager [phone] ?Office [phone] ? ? ?

## 2021-11-09 NOTE — Procedures (Signed)
Extubation Procedure Note ? ?Patient Details:   ?Name: Jessica [name] ?DOB: 1961/03/13 ?MRN: [id number] ?  ?Airway Documentation:  ?Airway 7.5 mm (Active)  ?Secured at (cm) 23 cm 11/09/21 0759  ?Measured From Lips 11/09/21 0759  ?Secured Location Right 11/09/21 0757  ?Secured By [employer] 11/09/21 0759  ?Tube Holder Repositioned Yes 11/09/21 0757  ?Prone position No 11/09/21 0757  ?Cuff Pressure (cm H2O) MOV (Manual Technique) 11/09/21 0757  ?Site Condition Dry 11/09/21 0757  ? ?Vent end date: 10/27/21 Vent end time: 1550  ? ?Evaluation ? O2 sats: stable throughout ?Complications: No apparent complications ?Patient did tolerate procedure well. ?Bilateral Breath Sounds: Diminished ?  ?Yes ? ?Johnette Abraham ?11/09/2021, 9:49 AM ? ?

## 2021-11-09 NOTE — Progress Notes (Signed)
Eakins pouch output: ? ?Surgery PA asked me to find a spot to document eakins pouch output since the LDA does not have an eakins pouch section.  ? ?I have documented the eakins pouch output under the output column that says "other". I will pass this along in report so that this column can be used to track eakins pouch output.  ?

## 2021-11-09 NOTE — Progress Notes (Signed)
SLP Cancellation Note ? ?Patient Details ?Name: MARZELLA MIRACLE ?MRN: [id number] ?DOB: 02-Dec-1960 ? ? ?Cancelled treatment:       Reason Eval/Treat Not Completed: Other (comment) (pt remains intubated at this time, will sign off, please reorder as desire) ? ?Kathleen Lime, MS CCC SLP ?Acute Rehab Services ?Office [phone] ?Pager [phone] ? ? ?[name] ?11/09/2021, 7:13 AM ?

## 2021-11-09 NOTE — Progress Notes (Signed)
PHARMACY - TOTAL PARENTERAL NUTRITION CONSULT NOTE  ? ?Indication: Prolonged ileus ? ?Patient Measurements: ?Height: '5\' 10"'$  (177.8 cm) ?Weight: 54.1 kg (119 lb 4.3 oz) ?IBW/kg (Calculated) : 68.5 ?TPN AdjBW (KG): 53.8 ?Body mass index is 17.11 kg/m?. ?Usual Weight: 53.8 kg ? ?Assessment: 61 yo F w/ PMH etoh use disorder, smoker, HTN, GERD ?s/p Exp Lap Hartmans 10/22/2021 for perforated sigmoid colon, Dr. Marcello Moores  ? ?Glucose / Insulin: no hx DM.  CBGs continue to be <150.  No SSI (d/c 5/1) ?Electrolytes: Na stable at 139 (removed Na 4/24), K 4.3 stable (goal K >=4), Mag 2.1 stable (goal >=2, other lytes wnl ?Vitamins:  Vitamin C <0.1 (4/22), receiving 200 mg ascorbic acid daily in TPN from MVI ?Vitamin A 14.2 (4/22), receiving 1 mg of vitamin A daily in TPN from MVI ?Unable to add additional ascorbic acid and vitamin A to TPN per TPN policy ?Renal: SCr inc back up to 1.73, BUN inc to 79 ?Hepatic: LFTs WNL; T bili down to 0.7 ?- 5/1: TG 108 ?Intake / Output;  UOP 1500 ml last 24 hr down, Drains 330 mL/day, no Stool output charted ?Net I/O yesterday: 1246 mL ?MIVF:  NS @ KVO ?GI Imaging:  ?4/15 CTA: bowel perforation ?4/17 KUB ileus or pSBO ?4/20 CTA: severe ileus or pSBO ? ?GI Surgeries / Procedures:  ?10/22/21 s/p EXPLORATORY LAPAROTOMY hartmans for perforated sigmoid colon, Dr. Marcello Moores  ?4/23 IR placed drain LLQ ?Central access: CVC TL placed 10/23/21 ?TPN start date: 10/26/21 ? ?Nutritional Goals: ?Goal TPN rate is 80 mL/hr (provides ~ 111g of protein and ~ 2001 kcals per day)  ? ?RD Assessment: updated 5/2 as patient was intubated ?Estimated Needs ?Total Energy Estimated Needs: 1635 kcal ?Total Protein Estimated Needs: 110-125 grams ?Total Fluid Estimated Needs: >/= 3 L/day ? ?5/3: Discussed with RD - now that patient was extubated this AM, ok to target previous nutrition goals as listed below: ?Total Energy Estimated Needs: 1900-2150 kcal ?Total Protein Estimated Needs: 110-125 grams ?Total Fluid Estimated Needs: >/= 3  L/day ? ?Current Nutrition:  ?TPN @ 80 ml/hr  ?NPO except for ice chips ? ?Plan:  ?Continue TPN @  80 mL/hr to provide 100% of goals ?Electrolytes in TPN:  ?Na 0 mEq/L ?K 70 mEq/L ?Ca 35mq/L ?Mg 564m/L ?Phos 68m26m/L ?Cl:Ac max Ac  ?Continue standard MVI and trace elements to TPN ?Continue folic acid & thiamine in TPN ?Monitor TPN labs on Mon/Thurs ? ?MarDimple NanasharmD ?11/09/2021 10:14 AM ? ? ?

## 2021-11-09 NOTE — Progress Notes (Signed)
eLink Physician-Brief Progress Note ?Patient Name: YOSELINE ANDERSSON ?DOB: [birth year] ?MRN: [id number] ? ? ?Date of Service ? 11/09/2021  ?HPI/Events of Note ? Hemoglobin 6.6 gm, no overt bleeding per bedside RN,  ?eICU Interventions ? One unit of  PRBC ordered transfused.  ? ? ? ?  ? ?Kerry Kass Chicquita Mendel ?11/09/2021, 4:40 AM ?

## 2021-11-09 NOTE — Progress Notes (Signed)
? ?                                                                                                                                                     ?                                                   ?Daily Progress Note  ? ?Patient Name: Jessica [name]       Date: 11/09/2021 ?DOB: 12/19/1960  Age: 61 y.o. MRN#: [id number] ?Attending Physician: Brand Males, MD ?Primary Care Physician: Deland Pretty, MD ?Admit Date: 10/22/2021 ? ?Reason for Consultation/Follow-up: Establishing goals of care ? ?Subjective: ?I saw and examined Ms. Roca after extubation this morning.  She was awake and alert but speech was difficult to understand. ? ?Attempted to discuss with her regarding clinical course over the past couple of days.  I also talked with her about need to determine who would be her surrogate decision maker.  She does indicate having a brother but adamantly indicates that she does not want him involved in her care.  Rene Paci is listed as primary contact and I tried to determine with her if this is who she feels would be the best person to help make decisions on her behalf.  She went back and forth between saying [name] and Jeneen Rinks with no consistency in her answer. ? ?Discussed with her that this is something that I and planning to bring up again to potentially further discuss tomorrow. ? ?Length of Stay: 18 ? ?Current Medications: ?Scheduled Meds:  ? Chlorhexidine Gluconate Cloth  6 each Topical Daily  ? heparin injection (subcutaneous)  5,000 Units Subcutaneous Q8H  ? ipratropium-albuterol  3 mL Nebulization BID  ? mouth rinse  15 mL Mouth Rinse BID  ? nicotine  14 mg Transdermal Daily  ? pantoprazole (PROTONIX) IV  40 mg Intravenous Q24H  ? sodium chloride flush  10-40 mL Intracatheter Q12H  ? sodium chloride flush  5 mL Intracatheter Q8H  ? ? ?Continuous Infusions: ? sodium chloride Stopped (11/08/21 0755)  ? norepinephrine (LEVOPHED) Adult infusion Stopped (11/09/21 1123)  ? piperacillin-tazobactam (ZOSYN)   IV 3.375 g (11/09/21 1733)  ? TPN ADULT (ION) Stopped (11/09/21 1714)  ? TPN ADULT (ION) 80 mL/hr at 11/09/21 1715  ? ? ?PRN Meds: ?acetaminophen, antiseptic oral rinse, fentaNYL (SUBLIMAZE) injection, lip balm, LORazepam, ondansetron (ZOFRAN) IV, sodium chloride flush ? ?Physical Exam         ?Appears chronically ill and frail ?Extubated ?Has JP drain ?Has ostomy ?Appears with generalized weakness ? ?Vital Signs: BP 135/62   Pulse 95   Temp (!) 100.6 ?F (38.1 ?C) (Axillary)   Resp 13  Ht '5\' 10"'$  (1.778 m)   Wt 54.1 kg   SpO2 98%   BMI 17.11 kg/m?  ?SpO2: SpO2: 98 % ?O2 Device: O2 Device: Room Air ?O2 Flow Rate: O2 Flow Rate (L/min): 2 L/min ? ?Intake/output summary:  ?Intake/Output Summary (Last 24 hours) at 11/09/2021 1744 ?Last data filed at 11/09/2021 1733 ?Gross per 24 hour  ?Intake 2629.31 ml  ?Output 3000 ml  ?Net -370.69 ml  ? ? ?LBM: Last BM Date : 11/07/21 ?Baseline Weight: Weight: 54 kg ?Most recent weight: Weight: 54.1 kg ? ?     ?Palliative Assessment/Data: ? ? ? ? ? ?Patient Active Problem List  ? Diagnosis Date Noted  ? Severe sepsis due to perforated sigmoid colon s/p Hartmann/colostomy 10/22/2021 10/22/2021  ? Acute respiratory failure with hypoxia ([place]) 11/03/2021  ? Acute metabolic encephalopathy 83/66/2947  ? Oropharyngeal dysphagia 11/03/2021  ? Sinus tachycardia 11/05/2021  ? Normocytic anemia 11/04/2021  ? Hypernatremia, hyponatremia, hypokalemia, hyperphosphatemia 11/03/2021  ? Elevated brain natriuretic peptide (BNP) level 11/03/2021  ? Right proximal humeral fracture 11/03/2021  ? AKI (acute kidney injury) ([place]) 10/25/2021  ? Chronic pain 10/24/2021  ? Tobacco use disorder 10/24/2021  ? Uncontrolled hypertension 10/24/2021  ? Protein-calorie malnutrition, severe ([place]) 10/24/2021  ? Septic shock ([place]) 11/09/2021  ? Postoperative intra-abdominal abscess 11/09/2021  ? 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 Malnutrition of moderate degree 11/07/2021  ? Palliative care by specialist   ? Goals of  care, counseling/discussion   ? General weakness   ? Hypokalemia 11/03/2021  ? Hyperphosphatemia 11/03/2021  ? Hyponatremia 11/03/2021  ? Colostomy in place [facility]) 10/30/2021  ? Stricture and stenosis of esophagus 10/30/2021  ? Gastrostomy tube in place [facility]) 10/30/2021  ? Anxiety 10/24/2021  ? Allergic rhinitis 10/24/2021  ? 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 Underweight 10/24/2021  ? Acquired hammer toe of right foot 10/24/2021  ? Urticaria 10/24/2021  ? Dry mouth 10/24/2021  ? Substance induced mood disorder ([place])   ? ? ?Palliative Care Assessment & Plan  ? ?Patient Profile: ?  ? ?Assessment: ?61 year old lady with history of alcohol use disorder, tobacco use, recent right proximal humeral fracture, presented to the hospital at this time with diffuse abdominal pain.  Was found to have sepsis sigmoid perforation, was placed on antibiotics and underwent ex lap with Hartman's procedure and G-tube placement on 10-22-2021.  Postop course complicated by patient requiring reintubation and vasopressors on 4-17 and then she was extubated on 4-20.  Periodically has required Precedex due to agitation and hallucinations.  Found to have postop abdominal abscess requiring drainage on 4-22.  Started on TPN due to tube feed intolerance.  Weaned off of vasopressors as well as Precedex.  Hospital course complicated by feculent output from laparotomy site and repeat CT abdomen on 4-28 with new fluid collection within the cul-de-sac.  Remains on IV antibiotics and TPN.  Interventional radiology and general surgery are following.  Palliative medicine team consulted for ongoing goals of care discussions.  At the time of initial consultation patient had endorsed full code/full scope.  She had stated that she had survived her colon bursting inside of her and hence wished to continue with any and all means to get better.  Hospital course complicated by patient having new fluid collection in her abdomen on repeat imaging as well as ongoing  weakness. ? ?Recommendations/Plan: ?Continue current mode of care. ?Complicated situation in regard to surrogate decision making.  I am hopeful that she is able to more consistently participate in conversation and  we will plan to attempt to readdress tomorrow. ? ?Goals of Care and Additional Recommendations: ?Limitations on Scope of Treatment: Full Scope Treatment ? ?Code Status: ? ?  ?Code Status Orders  ?(From admission, onward)  ?  ? ? ?  ? ?  Start     Ordered  ? 10/22/21 1429  Full code  Continuous       ? 10/22/21 1431  ? ?  ?  ? ?  ? ?Code Status History   ? ? Date Active Date Inactive Code Status Order ID Comments User Context  ? 12/04/2017 2253 12/12/2017 1930 Full Code [id number]  Reyne Dumas, MD ED  ? ?  ? ? ?Prognosis: ? Guarded  ? ?Discharge Planning: ?To Be Determined based on hospital course.  ? ?Care plan was discussed with RN. ? ?Thank you for allowing the Palliative Medicine Team to assist in the care of this patient. ? ? ?Time In: 1000 Time Out: 1040 Total Time 40 Prolonged Time Billed  no   ? ?Micheline Rough, MD ? ?Please contact Palliative Medicine Team phone at [phone] for questions and concerns.  ? ? ? ? ? ?

## 2021-11-10 ENCOUNTER — Inpatient Hospital Stay (HOSPITAL_COMMUNITY): Payer: Self-pay

## 2021-11-10 DIAGNOSIS — E871 Hypo-osmolality and hyponatremia: Secondary | ICD-10-CM

## 2021-11-10 DIAGNOSIS — K632 Fistula of intestine: Secondary | ICD-10-CM

## 2021-11-10 DIAGNOSIS — R6521 Severe sepsis with septic shock: Secondary | ICD-10-CM

## 2021-11-10 DIAGNOSIS — A419 Sepsis, unspecified organism: Secondary | ICD-10-CM

## 2021-11-10 LAB — COMPREHENSIVE METABOLIC PANEL
ALT: 18 U/L (ref 0–44)
AST: 18 U/L (ref 15–41)
Albumin: 1.9 g/dL — ABNORMAL LOW (ref 3.5–5.0)
Alkaline Phosphatase: 90 U/L (ref 38–126)
Anion gap: 7 (ref 5–15)
BUN: 66 mg/dL — ABNORMAL HIGH (ref 6–20)
CO2: 24 mmol/L (ref 22–32)
Calcium: 8.5 mg/dL — ABNORMAL LOW (ref 8.9–10.3)
Chloride: 110 mmol/L (ref 98–111)
Creatinine, Ser: 1.37 mg/dL — ABNORMAL HIGH (ref 0.44–1.00)
GFR, Estimated: 44 mL/min — ABNORMAL LOW (ref >60)
Glucose, Bld: 128 mg/dL — ABNORMAL HIGH (ref 70–99)
Potassium: 3.6 mmol/L (ref 3.5–5.1)
Sodium: 141 mmol/L (ref 135–145)
Total Bilirubin: 0.6 mg/dL (ref 0.3–1.2)
Total Protein: 6.4 g/dL — ABNORMAL LOW (ref 6.5–8.1)

## 2021-11-10 LAB — BPAM RBC
Blood Product Expiration Date: 202305312359
ISSUE DATE / TIME: 202305031117
Unit Type and Rh: 5100

## 2021-11-10 LAB — TYPE AND SCREEN
ABO/RH(D): O POS
Antibody Screen: NEGATIVE
Unit division: 0

## 2021-11-10 LAB — GLUCOSE, CAPILLARY
Glucose-Capillary: 105 mg/dL — ABNORMAL HIGH (ref 70–99)
Glucose-Capillary: 108 mg/dL — ABNORMAL HIGH (ref 70–99)
Glucose-Capillary: 110 mg/dL — ABNORMAL HIGH (ref 70–99)
Glucose-Capillary: 121 mg/dL — ABNORMAL HIGH (ref 70–99)
Glucose-Capillary: 128 mg/dL — ABNORMAL HIGH (ref 70–99)

## 2021-11-10 LAB — MAGNESIUM: Magnesium: 1.8 mg/dL (ref 1.7–2.4)

## 2021-11-10 LAB — TRIGLYCERIDES: Triglycerides: 141 mg/dL (ref <150)

## 2021-11-10 LAB — PHOSPHORUS: Phosphorus: 3.5 mg/dL (ref 2.5–4.6)

## 2021-11-10 MED ORDER — FREE WATER
200.0000 mL | Freq: Four times a day (QID) | Status: DC
  Administered 2021-11-10 – 2021-11-11 (×5): 200 mL

## 2021-11-10 MED ORDER — HYDRALAZINE HCL 20 MG/ML IJ SOLN
5.0000 mg | INTRAMUSCULAR | Status: DC | PRN
  Administered 2021-11-10 – 2021-11-11 (×3): 5 mg via INTRAVENOUS
  Filled 2021-11-10 (×3): qty 1

## 2021-11-10 MED ORDER — SODIUM CHLORIDE (PF) 0.9 % IJ SOLN
INTRAMUSCULAR | Status: AC
  Filled 2021-11-10: qty 50

## 2021-11-10 MED ORDER — IOHEXOL 9 MG/ML PO SOLN
120.0000 mL | ORAL | Status: AC
  Administered 2021-11-10: 120 mL via ORAL

## 2021-11-10 MED ORDER — TRAVASOL 10 % IV SOLN
INTRAVENOUS | Status: AC
  Filled 2021-11-10: qty 1252.8

## 2021-11-10 MED ORDER — MAGNESIUM SULFATE 2 GM/50ML IV SOLN
2.0000 g | Freq: Once | INTRAVENOUS | Status: AC
  Administered 2021-11-10: 2 g via INTRAVENOUS
  Filled 2021-11-10: qty 50

## 2021-11-10 MED ORDER — IOHEXOL 300 MG/ML  SOLN
80.0000 mL | Freq: Once | INTRAMUSCULAR | Status: AC | PRN
Start: 2021-11-10 — End: 2021-11-10
  Administered 2021-11-10: 75 mL via INTRAVENOUS

## 2021-11-10 MED ORDER — POTASSIUM CHLORIDE 10 MEQ/50ML IV SOLN
10.0000 meq | INTRAVENOUS | Status: AC
  Administered 2021-11-10 (×4): 10 meq via INTRAVENOUS
  Filled 2021-11-10 (×3): qty 50

## 2021-11-10 NOTE — Progress Notes (Addendum)
? ? ?19 Days Post-Op  ?Subjective: ?CC: ?Extubated yesterday. ?Most awake and alert I have seen her. Answering questions. Noted pain in her upper abdomen and nausea. Eakins w/ 400cc/24 hrs. Ostomy had some stool output (120cc/24 hours). Febrile to 101 overnight.  ?Palliative also involved to clarify who would be her surrogate decision maker and establish [place].  ? ?Objective: ?Vital signs in last 24 hours: ?Temp:  [98 ?F (36.7 ?C)-101 ?F (38.3 ?C)] 98.8 ?F (37.1 ?C) (05/04 0400) ?Pulse Rate:  [87-114] 96 (05/04 0700) ?Resp:  [11-25] 16 (05/04 0700) ?BP: (118-166)/(5-112) 154/66 (05/04 0700) ?SpO2:  [94 %-100 %] 94 % (05/04 0753) ?Last BM Date : 11/07/21 ? ?Intake/Output from previous day: ?05/03 0701 - 05/04 0700 ?In: 2303.3 [I.V.:1863.4; Blood:289; IV Piggyback:151] ?Out: 2670 [Urine:2000; Drains:150; Stool:120] ?Intake/Output this shift: ?No intake/output data recorded. ? ?PE: ?Gen:  Alert, NAD, pleasant ?Card:  Reg, 90's ?Pulm: Rate and effort normal ?Abd: Mildly distended but soft. Appropriate ttp, greatest in the epigastrium and LUQ without peritonitis.  Gastrostomy in place with bilious drainage in canister. Drain in LLQ with scant serous looking fluid. Stoma pink budded and viable with small amount of liquid stool in ostomy bag. Eakins pouch with feculent drainage  ? ?Lab Results:  ?Recent Labs  ?  11/08/21 ?9371 11/09/21 ?0258 11/09/21 ?1545  ?WBC 14.5* 13.0*  --   ?HGB 7.1* 6.6* 8.3*  ?HCT 22.3* 20.7* 25.4*  ?PLT 373 376  --   ? ?BMET ?Recent Labs  ?  11/09/21 ?0258 11/10/21 ?0250  ?NA 139 141  ?K 4.3 3.6  ?CL 108 110  ?CO2 24 24  ?GLUCOSE 121* 128*  ?BUN 79* 66*  ?CREATININE 1.73* 1.37*  ?CALCIUM 8.3* 8.5*  ? ?PT/INR ?No results for input(s): LABPROT, INR in the last 72 hours. ?CMP  ?   ?Component Value Date/Time  ? NA 141 11/10/2021 0250  ? K 3.6 11/10/2021 0250  ? CL 110 11/10/2021 0250  ? CO2 24 11/10/2021 0250  ? GLUCOSE 128 (H) 11/10/2021 0250  ? BUN 66 (H) 11/10/2021 0250  ? CREATININE 1.37 (H)  11/10/2021 0250  ? CALCIUM 8.5 (L) 11/10/2021 0250  ? PROT 6.4 (L) 11/10/2021 0250  ? ALBUMIN 1.9 (L) 11/10/2021 0250  ? AST 18 11/10/2021 0250  ? ALT 18 11/10/2021 0250  ? ALKPHOS 90 11/10/2021 0250  ? BILITOT 0.6 11/10/2021 0250  ? GFRNONAA 44 (L) 11/10/2021 0250  ? GFRAA >60 12/12/2017 0433  ? ?Lipase  ?   ?Component Value Date/Time  ? LIPASE 27 10/22/2021 1141  ? ? ?Studies/Results: ?No results found. ? ?Anti-infectives: ?Anti-infectives (From admission, onward)  ? ? Start     Dose/Rate Route Frequency Ordered Stop  ? 11/04/21 1000  piperacillin-tazobactam (ZOSYN) IVPB 3.375 g       ? 3.375 g ?12.5 mL/hr over 240 Minutes Intravenous Every 8 hours 11/04/21 0935 11/11/21 1759  ? 10/27/21 1600  cefTRIAXone (ROCEPHIN) 2 g in sodium chloride 0.9 % 100 mL IVPB  Status:  Discontinued       ? 2 g ?200 mL/hr over 30 Minutes Intravenous Every 24 hours 10/27/21 0843 10/27/21 1106  ? 10/27/21 1600  piperacillin-tazobactam (ZOSYN) IVPB 3.375 g       ? 3.375 g ?12.5 mL/hr over 240 Minutes Intravenous Every 8 hours 10/27/21 1106 11/03/21 2000  ? 10/27/21 1400  metroNIDAZOLE (FLAGYL) IVPB 500 mg  Status:  Discontinued       ? 500 mg ?100 mL/hr over 60 Minutes Intravenous Every 12  hours 10/27/21 0843 10/27/21 1106  ? 10/24/21 1600  piperacillin-tazobactam (ZOSYN) IVPB 3.375 g  Status:  Discontinued       ? 3.375 g ?12.5 mL/hr over 240 Minutes Intravenous Every 8 hours 10/24/21 1422 10/27/21 0843  ? 10/24/21 1000  cefTRIAXone (ROCEPHIN) 2 g in sodium chloride 0.9 % 100 mL IVPB  Status:  Discontinued       ? 2 g ?200 mL/hr over 30 Minutes Intravenous Every 24 hours 10/24/21 0747 10/24/21 1357  ? 10/23/21 1530  vancomycin (VANCOCIN) IVPB 1000 mg/200 mL premix       ? 1,000 mg ?200 mL/hr over 60 Minutes Intravenous  Once 10/23/21 1439 10/23/21 1547  ? 10/23/21 0200  ceFEPIme (MAXIPIME) 2 g in sodium chloride 0.9 % 100 mL IVPB  Status:  Discontinued       ? 2 g ?200 mL/hr over 30 Minutes Intravenous Every 12 hours 10/22/21 2032  10/24/21 0747  ? 10/22/21 2200  cefOXitin (MEFOXIN) 2 g in sodium chloride 0.9 % 100 mL IVPB  Status:  Discontinued       ? 2 g ?200 mL/hr over 30 Minutes Intravenous Every 8 hours 10/22/21 1706 10/22/21 2032  ? 10/22/21 1956  vancomycin variable dose per unstable renal function (pharmacist dosing)  Status:  Discontinued       ?  Does not apply See admin instructions 10/22/21 1957 10/23/21 1917  ? 10/22/21 1245  vancomycin (VANCOCIN) IVPB 1000 mg/200 mL premix       ? 1,000 mg ?200 mL/hr over 60 Minutes Intravenous  Once 10/22/21 1241 10/22/21 1402  ? 10/22/21 1245  ceFEPIme (MAXIPIME) 2 g in sodium chloride 0.9 % 100 mL IVPB       ? 2 g ?200 mL/hr over 30 Minutes Intravenous  Once 10/22/21 1241 10/22/21 1321  ? 10/22/21 1230  metroNIDAZOLE (FLAGYL) IVPB 500 mg  Status:  Discontinued       ? 500 mg ?100 mL/hr over 60 Minutes Intravenous Every 12 hours 10/22/21 1222 10/24/21 1357  ? ?  ? ? ? ?Assessment/Plan ?POD#19 - PERFORATED SIGMOID COLON - status post EX LAP, HARTMAN'S PROCEDURE, Gastrostomy tube placement - 3/53/6144 Dr. 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 Con't Eakins pouch. Less output from stoma so seems likely suspected CC fistula is proximal to ostomy. Would be helpful to do CT with gastrografin through stoma to localize. Cont NPO, G-tube to LIWS, TPN, ABx ?- CT 4/28 showed pelvic fluid collection. IR asked to comment - no IV contrast so not clear if this can be drained.   ?With improvement in kidney function may be able to get a CT w/ reduced dose IV contrast & gastrografin via stoma to assess pelvic fluid collection and possible CC fistula today. Will discuss with MD and also touch  base with [facility].  ?  ?FEN - G-tube to LIWS, TPN for severe protein calorie malnutrition ?VTE - SCDs, SQH ?ID - zosyn 4/17 - 4/27; 4/28 >> Tmax 101 ?  ?- below per [facility]- ?Tongue edema - SLP following  ?Acute resp failure - extubated 5/3 ?Klebsiella oxytoca bacteremia - repeat blood cxs 4/24 negative  ?Protein calorie malnutrition - continue TPN for now ?AKI - Cr 1.37 today,  UOP good  ?HTN ?Tobacco use ?Etoh use w/ hx of DT's ?Hx R humerus fx - on xray 3/13. Has seen Dr. Sammuel Hines in the office on 3/20 w/ plans for non-op management, NWB x 1 month and reassessment    ? ? LOS: 19 days  ? ? ?Jillyn Ledger , PA-C ?[facility] ?11/10/2021, 8:27 AM ?Please see Amion for pager number during day hours 7:00am-4:30pm ? ?

## 2021-11-10 NOTE — Progress Notes (Signed)
PHARMACY - TOTAL PARENTERAL NUTRITION CONSULT NOTE  ? ?Indication: Prolonged ileus ? ?Patient Measurements: ?Height: '5\' 10"'$  (177.8 cm) ?Weight: 54.1 kg (119 lb 4.3 oz) ?IBW/kg (Calculated) : 68.5 ?TPN AdjBW (KG): 53.8 ?Body mass index is 17.11 kg/m?. ?Usual Weight: 53.8 kg ? ?Assessment: 61 yo F w/ PMH etoh use disorder, smoker, HTN, GERD.  S/p Exp Lap Hartmans 10/22/2021 for perforated sigmoid colon, Dr. Marcello Moores.   ? ?Glucose / Insulin: no hx DM.  CBGs continue to be <150.  No SSI (d/c 5/1) ?Electrolytes: Na 141 (removed Na 4/24), K decreased to 3.6 (goal K >=4), Mag decreased to 1.8 (goal >=2), other lytes wnl ?Vitamins:  Vitamin C <0.1 (4/22), receiving 200 mg ascorbic acid daily in TPN from MVI ?Vitamin A 14.2 (4/22), receiving 1 mg of vitamin A daily in TPN from [facility] ?Unable to add additional ascorbic acid and vitamin A to TPN per TPN policy ?Renal: SCr back down to 1.37, BUN down 66 ?Hepatic: LFTs WNL; T bili WNL ?- 5/1: TG 108, 5/4 TG 141 ?Intake / Output;  UOP up to 2L/ 24h, Drains total 550 mL, Stool 120 mL ?- maintenance fluids:  Free water 200 mL per tube q6h, NS @ KVO ?Net I/O yesterday: -366 mL ?GI Imaging:  ?4/15 CTA: bowel perforation ?4/17 KUB ileus or pSBO ?4/20 CTA: severe ileus or pSBO ? ?GI Surgeries / Procedures:  ?10/22/21 s/p EXPLORATORY LAPAROTOMY hartmans for perforated sigmoid colon ?4/23 IR placed drain LLQ ? ?Central access: CVC TL placed 10/23/21 ?TPN start date: 10/26/21 ? ?Nutritional Goals: ?Goal TPN rate is 90 mL/hr (provides 125 g of protein and ~ 2060 kcals per day)  ? ?RD Assessment: updated 5/2 as patient was intubated ?Estimated Needs ?Total Energy Estimated Needs: 1635 kcal ?Total Protein Estimated Needs: 110-125 grams ?Total Fluid Estimated Needs: >/= 3 L/day ? ?5/3: Discussed with RD - now that patient was extubated this AM, ok to target previous nutrition goals as listed below: ?Total Energy Estimated Needs: 1900-2150 kcal ?Total Protein Estimated Needs: 110-125 grams ?Total Fluid  Estimated Needs: >/= 3 L/day ? ?Current Nutrition:  ?TPN @ 80 ml/hr  ?NPO except for ice chips ? ?Plan:  ?Per MD: KCl 42mq IV x4 runs; Mag 2g IV x1 ?Continue TPN @  90 mL/hr to provide 100% of goals ?Electrolytes in TPN:  ?Na 0 mEq/L ?K 70 mEq/L ?Ca 5 mEq/L ?Mg 6 mEq/L ?Phos 7 mmol/L ?Cl:Ac max Ac  ?Continue standard MVI and trace elements to TPN ?Continue folic acid & thiamine in TPN ?Monitor TPN labs on Mon/Thurs ? ? ?CGretta ArabPharmD, BCPS ?Clinical Pharmacist ?[facility] [phone]?11/10/2021 8:33 AM ? ? ? ?

## 2021-11-10 NOTE — Progress Notes (Signed)
[facility] ADULT ICU REPLACEMENT PROTOCOL ? ? ?The patient does apply for the [facility] Adult ICU Electrolyte Replacment Protocol based on the criteria listed below:  ? ?1.Exclusion criteria: TCTS patients, ECMO patients, and Dialysis patients ?2. Is GFR >/= 30 ml/min? Yes.    ?Patient's GFR today is 44 ?3. Is SCr </= 2? Yes.   ?Patient's SCr is 1.37 mg/dL ?4. Did SCr increase >/= 0.5 in 24 hours? No. ?5.Pt's weight >40kg  Yes.   ?6. Abnormal electrolyte(s): mag 1.8, K+ 3.6  ?7. Electrolytes replaced per protocol ?8.  Call MD STAT for K+ </= 2.5, Phos </= 1, or Mag </= 1 ?Physician:  n/a ? ?Jessica [name] 11/10/2021 4:27 AM ? ?

## 2021-11-10 NOTE — Progress Notes (Addendum)
[facility] ?PROGRESS NOTE ? ? ? ?Progress Note  ?[name]  PPJ:[id number] DOB: 11-13-1960 DOA: 10/22/2021 ?PCP: Deland Pretty, MD  ? ? ? ?Brief Narrative:  ? ?Jessica [name] is an 61 y.o. female past medical history significant for alcohol abuse, with admissions for severe alcohol withdrawals requiring ICU admissions tobacco use presented to the ED on 10/22/2021 diffuse abdominal pain that started the day prior to admission.  CT of the abdomen pelvis showed pneumoperitoneum and free fluid, severe sepsis with started on broad-spectrum antibiotics fluid resuscitated surgery was consulted was taken emergently to the OR underwent exploratory laparotomy and was found to have a perforated sigmoid colon, status post Hartmann procedure and insertion of the G-tube which was left to drain to gravity, JP in pelvis was noted to have feculent peritonitis in the pelvis.  ICU course was complicated by Klebsiella bacteremia profound caloric protein malnutrition requiring TPN, acute respiratory failure with hypoxia and tachycardia with failure to progress ? ? ?Significant Events: ?10/22/21 ex lap, hartman's procedure, g tube placement. Extubated in OR ?4/17 Reintubated overnight with continued pressor support x2. 11 liters positive thus far with progressive AKI this am  ?4/18 Continued issues with electrolyte abnormalities overnight, remains on levo and neo. She is now 14L positive but urine output has increased  ?4/19 No issues overnight, remains 14L positive but urine output continues to improve. Start to slowly diureses today  ?4/20 weaning NE, extubated, diuresed, started trickle TF, remains on precedex for agitation  ?4/21 periods of agitation/ hallucinations, scheduled ativan helps, remains on precedex 0.7. CT A/P with suspicious for partial small bowel obstruction, with transition point in the lower central pelvis. Increased small to moderate amount of ascites, without definite focal abscess collection. Small amount  of postop free air also noted. New small left pleural effusion and left lower lobe atelectasis. New diffuse body wall edema, consistent with anasarca. Cholelithiasis.  ?4/22 On  TPN.  Did not tolerated TF yesterday -vomitted and now G tube to LIS with bilios returns.  Low grade fever +. WBC now normal. On Zosyn. Hgb < 7g% without overt bleeding - 1 U PRBC. delirium worse.  ?4/24 Remains off vasopressors, overnight with agitation requiring precedex, 5 mg Haldol, 2 mg versed ?4/25 Out of bed. SLP eval declined by patient as well as OT.  ?4/26 Off precedex. Seen by palliative care > pt requested full code. No family.  ?4/27 Transferred to [facility]  ?5/1 PCCM called back for AMS, intubated for declining mental status and hypoxia. Back in Septic shock, working dx on-going abd sepsis and new aspiration PNA  ?5/2 Followed by IR and surgery both concerned about on-going intrabd infection. 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 Both teams wanting contrasted film but renal fxn barrier. ?5/3 got blood. Passed SBT->extubated  ? ?Assessment/Plan:  ? ?Severe septic shock due to perforated sigmoid colon s/p Hartmann/colostomy 10/22/2021/with G-tube placement 10/21/2021/.  Complicated by colocutaneous fistula with Pseudomonas intra-abdominal leak requiring percutaneous drainage: ?Low- quadrant drain management per IR that was placed on 10/29/2021, he is currently on IV Zosyn.  Antibiotic duration per surgery  ?Tmax of 101.6. ?TPN started on 10/26/2021. ?Surgery and IR are on your recommended CT of abdomen with contrast once renal functions improved. ?Wound care directed by surgical team. ? ?Septic shock secondary to aspiration pneumonia and ongoing abdominal infection: ?Has been weaned off pressors diuresis negative about 5-1/2 L. ?Appears to be euvolemic, currently off pressors. ? ?Acute respiratory failure with hypoxia secondary to aspiration pneumonia: ?Extubated on 10/27/2021  due to altered mental status had to be reintubated on  11/07/2021. ?Now extubated on 11/26/2021, as continues breathing trial. ?Currently n.p.o. try to limit sedation. ?Physical therapy is on board. ? ?Acute encephalopathy multifactorial including but not limited to ICU delirium, infectious etiology, alcohol withdrawal: ?He has been off sedation, have only been using low-dose Ativan. ?And low-dose fentanyl for pain. ? ?Acute kidney injury secondary to severe septic shock: ?We will start on IV diuresis now negative about 1-1/2 L. ?Peaked 1.9 now improved this morning 1.3. ?Hold diuresis for now. ? ?Electrolyte imbalance: ?Potassium 3.6, magnesium 1.8. ?Try to keep potassium greater than 4 magnesium greater than 2. ? ?Normocytic anemia: ?No evidence of bleeding ?Status post 20 of packed red blood cells as his hemoglobin was less than 7, this morning 8.3. ?Continue to monitor hemoglobin intermittently. ? ?Severe protein caloric malnutrition/cachexia: ?Currently on TPN. ?G-tube placed on 10/22/2021 ?Speech on board to evaluate. ?Estimated body mass index is 17.11 kg/m? as calculated from the following: ?  Height as of this encounter: '5\' 10"'$  (1.778 m). ?  Weight as of this encounter: 54.1 kg. ? ?Right proximal humerus fracture: ?Status post fall in March 2023 seen by Ortho during that month with plan for nonweightbearing for 1 month then follow-up with them as an outpatient. ? ?Klebsiella bacteremia ?blood cultures on 10/23/2018 ?She was initially placed on vancomycin Flagyl and cefepime on 10/22/2021 ?De-escalated to IV Zosyn 10/24/2021, she has now completed 2-week course of IV antibiotics for bacteremia. ?Continue IV Zosyn per surgery for intra-abdominal infection ? ?Oropharyngeal dysphagia: ?Speech on board to evaluate. ? ?Hypernatremia//hyponatremia: ?Resolved with IV diuresis likely hypovolemic. ?Start free water per tube.  As we are holding diuresis. ? ?Substance induced mood disorder (HCC)/ Anxiety ? ?Goals of care/ethics: ?She remains a full code ?Palliative care has been  consulted for complicated situation in regards to her surrogate decision-making ? ?Vertebral Pressure ulcer present ?RN Pressure Injury Documentation: ?Pressure Injury 10/27/21 Vertebral column Mid;Medial Deep Tissue Pressure Injury - Purple or maroon localized area of discolored intact skin or blood-filled blister due to damage of underlying soft tissue from pressure and/or shear. (Active)  ?10/27/21 0428  ?Location: Vertebral column  ?Location Orientation: Mid;Medial  ?Staging: Deep Tissue Pressure Injury - Purple or maroon localized area of discolored intact skin or blood-filled blister due to damage of underlying soft tissue from pressure and/or shear.  ?Wound Description (Comments):   ?Present on Admission: No  ?Dressing Type Foam - Lift dressing to assess site every shift 11/09/21 2000  ? ? ? ? ?DVT prophylaxis: lovenox ?Family Communication:none ?Status is: Inpatient ?Remains inpatient appropriate because: She has multiple medical problems including septic shock due to perforated bowels and aspiration ? ? ? ? ? ?Code Status:  ? ?  ?Code Status Orders  ?(From admission, onward)  ?  ? ? ?  ? ?  Start     Ordered  ? 10/22/21 1429  Full code  Continuous       ? 10/22/21 1431  ? ?  ?  ? ?  ? ?Code Status History   ? ? Date Active Date Inactive Code Status Order ID Comments User Context  ? 12/04/2017 2253 12/12/2017 1930 Full Code [id number]  Reyne Dumas, MD ED  ? ?  ? ? ? ? ?IV Access:  ? ?Peripheral IV ? ? ?Procedures and diagnostic studies:  ? ?No results found. ? ? ?Medical Consultants:  ? ?None. ? ? ?Subjective:  ? ? ?Kharlie Sallye Lat she relates that  she is really sick she thinks her life is over. ? ?Objective:  ? ? ?Vitals:  ? 11/10/21 0100 11/10/21 0200 11/10/21 0300 11/10/21 0400  ?BP: (!) 149/74 140/71  (!) 149/70  ?Pulse: 92 93 (!) 106 87  ?Resp: '19 14 17 14  '$ ?Temp:    98.8 ?F (37.1 ?C)  ?TempSrc:    Oral  ?SpO2: 99% 97% 99% 97%  ?Weight:      ?Height:      ? ?SpO2: 97 % ?O2 Flow Rate (L/min): 2  L/min ?FiO2 (%): 30 % ? ? ?Intake/Output Summary (Last 24 hours) at 11/10/2021 1610 ?Last data filed at 11/10/2021 0448 ?Gross per 24 hour  ?Intake 2303.33 ml  ?Output 2520 ml  ?Net -216.67 ml  ? ?Filed Weights  ? 05

## 2021-11-10 NOTE — Progress Notes (Signed)
? ?                                                                                                                                                     ?                                                   ?Daily Progress Note  ? ?Patient Name: Jessica [name]       Date: 11/10/2021 ?DOB: 08-Apr-1961  Age: 61 y.o. MRN#: [id number] ?Attending Physician: Charlynne Cousins, MD ?Primary Care Physician: Deland Pretty, MD ?Admit Date: 10/22/2021 ? ?Reason for Consultation/Follow-up: Establishing goals of care ? ?Subjective: ?I saw and examined Jessica [name] this morning.  She was awake and alert at time of my encounter.  She was able to participate in conversation but had some periods where she seemed to drift off or say things unrelated to topic we were currently discussing. ? ?Attempted to discuss her understanding of her situation.  She realizes she is critically ill and remains in the hospital.  We discussed the fact that she was on ventilator again and she states that she did not remember this fact. ? ?We also talked about home would be the best people to speak on her behalf in the instance she is unable to speak for herself.  We went through surrogate decision making from legal standpoint and [place].  She does not have a court appointed guardian.  She is not legally married.  Her parents are deceased.  She does not have any children.  She has 1 brother but reports that she has not spoken with him and she would not want him involved in her medical care or making decisions on her behalf.  She states that she has not talked with any friends about acting as her surrogate as she is "afraid to bother them" and "that is a tough thing to ask somebody to do."  Discussed that the people who have been present in the hospital have included Jessica [name] and Jessica [name].  States that Jessica [name] is a former long-term boyfriend whom she has lived with in the past.  She characterizes Jessica [name] as "somewhat" her current significant other.  When asked her  thoughts on who would know her wishes the best and whom she would want to speak on her behalf she stated, " maybe Jessica [name]."  She did not give a definitive answer.  Discussed with her that this is something I think that she needs to seriously consider and we would continue to discuss over the next couple of days. ? ?Length of Stay: 19 ? ?Current Medications: ?Scheduled Meds:  ? Chlorhexidine Gluconate Cloth  6 each Topical  Daily  ? free water  200 mL Per Tube Q6H  ? heparin injection (subcutaneous)  5,000 Units Subcutaneous Q8H  ? ipratropium-albuterol  3 mL Nebulization BID  ? mouth rinse  15 mL Mouth Rinse BID  ? nicotine  14 mg Transdermal Daily  ? pantoprazole (PROTONIX) IV  40 mg Intravenous Q24H  ? sodium chloride (PF)      ? sodium chloride flush  10-40 mL Intracatheter Q12H  ? sodium chloride flush  5 mL Intracatheter Q8H  ? ? ?Continuous Infusions: ? sodium chloride Stopped (11/08/21 0755)  ? norepinephrine (LEVOPHED) Adult infusion Stopped (11/09/21 1123)  ? piperacillin-tazobactam (ZOSYN)  IV Stopped (11/10/21 1351)  ? TPN ADULT (ION) 80 mL/hr at 11/10/21 0912  ? TPN ADULT (ION) 90 mL/hr at 11/10/21 1729  ? ? ?PRN Meds: ?acetaminophen, antiseptic oral rinse, fentaNYL (SUBLIMAZE) injection, hydrALAZINE, lip balm, LORazepam, ondansetron (ZOFRAN) IV, sodium chloride flush ? ?Physical Exam         ?Appears chronically ill and frail ?Extubated ?Has JP drain ?Has ostomy ?Appears with generalized weakness ? ?Vital Signs: BP (!) 172/88   Pulse (!) 102   Temp (!) 97.5 ?F (36.4 ?C) (Oral) Comment: RN notified  Resp 14   Ht '5\' 10"'$  (1.778 m)   Wt 54.1 kg   SpO2 98%   BMI 17.11 kg/m?  ?SpO2: SpO2: 98 % ?O2 Device: O2 Device: Room Air ?O2 Flow Rate: O2 Flow Rate (L/min): 2 L/min ? ?Intake/output summary:  ?Intake/Output Summary (Last 24 hours) at 11/10/2021 1746 ?Last data filed at 11/10/2021 1000 ?Gross per 24 hour  ?Intake 1667.56 ml  ?Output 1620 ml  ?Net 47.56 ml  ? ? ?LBM: Last BM Date : 11/07/21 ?Baseline Weight:  Weight: 54 kg ?Most recent weight: Weight: 54.1 kg ? ?     ?Palliative Assessment/Data: ? ? ? ? ? ?Patient Active Problem List  ? Diagnosis Date Noted  ? Severe sepsis due to perforated sigmoid colon s/p Hartmann/colostomy 10/22/2021 10/22/2021  ? Acute respiratory failure with hypoxia ([place]) 11/03/2021  ? Acute metabolic encephalopathy 73/53/2992  ? Oropharyngeal dysphagia 11/03/2021  ? Sinus tachycardia 11/05/2021  ? Normocytic anemia 11/04/2021  ? Hypernatremia, hyponatremia, hypokalemia, hyperphosphatemia 11/03/2021  ? Elevated brain natriuretic peptide (BNP) level 11/03/2021  ? Right proximal humeral fracture 11/03/2021  ? AKI (acute kidney injury) ([place]) 10/25/2021  ? Chronic pain 10/24/2021  ? Tobacco use disorder 10/24/2021  ? Uncontrolled hypertension 10/24/2021  ? Protein-calorie malnutrition, severe ([place]) 10/24/2021  ? Septic shock ([place]) 11/09/2021  ? Postoperative intra-abdominal abscess 11/09/2021  ? 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 Malnutrition of moderate degree 11/07/2021  ? Palliative care by specialist   ? Goals of care, counseling/discussion   ? General weakness   ? Hypokalemia 11/03/2021  ? Hyperphosphatemia 11/03/2021  ? Hyponatremia 11/03/2021  ? Colostomy in place [facility]) 10/30/2021  ? Stricture and stenosis of esophagus 10/30/2021  ? Gastrostomy tube in place [employer] [facility] [employer] [facility]) 10/30/2021  ? Anxiety 10/24/2021  ? Allergic rhinitis 10/24/2021  ? 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 Underweight 10/24/2021  ? Acquired hammer toe of right foot 10/24/2021  ? Urticaria 10/24/2021  ? Dry mouth 10/24/2021  ? Substance induced mood disorder ([place])   ? ? ?Palliative Care Assessment & Plan  ? ?Patient Profile: ?  ? ?Assessment: ?61 year old lady with history of alcohol use disorder, tobacco use, recent right proximal humeral fracture, presented to the hospital at this time with diffuse abdominal pain.  Was found to have sepsis sigmoid perforation, was placed on antibiotics  and underwent ex lap with Hartman's procedure and  G-tube placement on 10-22-2021.  Postop course complicated by patient requiring reintubation and vasopressors on 4-17 and then she was extubated on 4-20.  Periodically has required Precedex due to agitation and hallucinations.  Found to have postop abdominal abscess requiring drainage on 4-22.  Started on TPN due to tube feed intolerance.  Weaned off of vasopressors as well as Precedex.  Hospital course complicated by feculent output from laparotomy site and repeat CT abdomen on 4-28 with new fluid collection within the cul-de-sac.  Remains on IV antibiotics and TPN.  Interventional radiology and general surgery are following.  Palliative medicine team consulted for ongoing goals of care discussions.  At the time of initial consultation patient had endorsed full code/full scope.  She had stated that she had survived her colon bursting inside of her and hence wished to continue with any and all means to get better.  Hospital course complicated by patient having new fluid collection in her abdomen on repeat imaging as well as ongoing weakness. ? ?Recommendations/Plan: ?Continue current mode of care. ?Complicated situation in regard to surrogate decision making.  We will continue to discuss with her and hopefully there is consistency in her wishes and we can work to establish healthcare power of attorney. ? ?Goals of Care and Additional Recommendations: ?Limitations on Scope of Treatment: Full Scope Treatment ? ?Code Status: ? ?  ?Code Status Orders  ?(From admission, onward)  ?  ? ? ?  ? ?  Start     Ordered  ? 10/22/21 1429  Full code  Continuous       ? 10/22/21 1431  ? ?  ?  ? ?  ? ?Code Status History   ? ? Date Active Date Inactive Code Status Order ID Comments User Context  ? 12/04/2017 2253 12/12/2017 1930 Full Code [id number]  Reyne Dumas, MD ED  ? ?  ? ? ?Prognosis: ? Guarded  ? ?Discharge Planning: ?To Be Determined based on hospital course.  ? ?Care plan was discussed with RN. ? ?Thank you for allowing  the Palliative Medicine Team to assist in the care of this patient. ? ?Micheline Rough, MD ? ?Please contact Palliative Medicine Team phone at [phone] for questions and concerns.  ? ? ? ? ? ?

## 2021-11-10 NOTE — Progress Notes (Signed)
CPT on hold due to pt going to CT.  ?

## 2021-11-11 LAB — CBC
HCT: 26.5 % — ABNORMAL LOW (ref 36.0–46.0)
Hemoglobin: 8.7 g/dL — ABNORMAL LOW (ref 12.0–15.0)
MCH: 29.4 pg (ref 26.0–34.0)
MCHC: 32.8 g/dL (ref 30.0–36.0)
MCV: 89.5 fL (ref 80.0–100.0)
Platelets: 501 10*3/uL — ABNORMAL HIGH (ref 150–400)
RBC: 2.96 MIL/uL — ABNORMAL LOW (ref 3.87–5.11)
RDW: 16.4 % — ABNORMAL HIGH (ref 11.5–15.5)
WBC: 10.2 10*3/uL (ref 4.0–10.5)
nRBC: 0 % (ref 0.0–0.2)

## 2021-11-11 LAB — GLUCOSE, CAPILLARY
Glucose-Capillary: 119 mg/dL — ABNORMAL HIGH (ref 70–99)
Glucose-Capillary: 120 mg/dL — ABNORMAL HIGH (ref 70–99)
Glucose-Capillary: 133 mg/dL — ABNORMAL HIGH (ref 70–99)

## 2021-11-11 LAB — BASIC METABOLIC PANEL
Anion gap: 11 (ref 5–15)
BUN: 66 mg/dL — ABNORMAL HIGH (ref 6–20)
CO2: 22 mmol/L (ref 22–32)
Calcium: 9.2 mg/dL (ref 8.9–10.3)
Chloride: 109 mmol/L (ref 98–111)
Creatinine, Ser: 1.34 mg/dL — ABNORMAL HIGH (ref 0.44–1.00)
GFR, Estimated: 45 mL/min — ABNORMAL LOW (ref >60)
Glucose, Bld: 113 mg/dL — ABNORMAL HIGH (ref 70–99)
Potassium: 3.9 mmol/L (ref 3.5–5.1)
Sodium: 142 mmol/L (ref 135–145)

## 2021-11-11 LAB — MAGNESIUM: Magnesium: 2.1 mg/dL (ref 1.7–2.4)

## 2021-11-11 LAB — PHOSPHORUS: Phosphorus: 3.5 mg/dL (ref 2.5–4.6)

## 2021-11-11 MED ORDER — TRAVASOL 10 % IV SOLN
INTRAVENOUS | Status: AC
  Filled 2021-11-11: qty 1252.8

## 2021-11-11 MED ORDER — CHLORHEXIDINE GLUCONATE 0.12 % MT SOLN
15.0000 mL | Freq: Two times a day (BID) | OROMUCOSAL | Status: DC
  Administered 2021-11-11 – 2021-12-03 (×35): 15 mL via OROMUCOSAL
  Filled 2021-11-11 (×38): qty 15

## 2021-11-11 MED ORDER — CHLORHEXIDINE GLUCONATE CLOTH 2 % EX PADS
6.0000 | MEDICATED_PAD | Freq: Every day | CUTANEOUS | Status: DC
  Administered 2021-11-12 – 2021-12-06 (×26): 6 via TOPICAL

## 2021-11-11 MED ORDER — DEXTROSE 5 % IV SOLN: INTRAVENOUS | Status: AC

## 2021-11-11 MED ORDER — VITAMIN A 3 MG (10000 UNIT) PO CAPS
10000.0000 [IU] | ORAL_CAPSULE | Freq: Every day | ORAL | Status: DC
  Administered 2021-11-12 – 2021-11-28 (×15): 10000 [IU]
  Filled 2021-11-11 (×19): qty 1

## 2021-11-11 MED ORDER — SODIUM CHLORIDE 0.9% FLUSH
5.0000 mL | Freq: Two times a day (BID) | INTRAVENOUS | Status: DC
  Administered 2021-11-11 – 2021-11-27 (×19): 5 mL

## 2021-11-11 MED ORDER — ASCORBIC ACID 500 MG PO TABS
500.0000 mg | ORAL_TABLET | Freq: Two times a day (BID) | ORAL | Status: DC
  Administered 2021-11-11 – 2021-11-28 (×34): 500 mg
  Filled 2021-11-11 (×36): qty 1

## 2021-11-11 MED ORDER — ORAL CARE MOUTH RINSE
15.0000 mL | Freq: Two times a day (BID) | OROMUCOSAL | Status: DC
  Administered 2021-11-11 – 2021-12-06 (×32): 15 mL via OROMUCOSAL

## 2021-11-11 NOTE — Progress Notes (Signed)
PHARMACY - TOTAL PARENTERAL NUTRITION CONSULT NOTE  ? ?Indication: Prolonged ileus ? ?Patient Measurements: ?Height: '5\' 10"'$  (177.8 cm) ?Weight: 53.8 kg (118 lb 9.7 oz) ?IBW/kg (Calculated) : 68.5 ?TPN AdjBW (KG): 53.8 ?Body mass index is 17.02 kg/m?. ?Usual Weight: 53.8 kg ? ?Assessment: 61 yo F w/ PMH etoh use disorder, smoker, HTN, GERD.  S/p Exp Lap Hartmans 10/22/2021 for perforated sigmoid colon, Dr. Marcello Moores.   ? ?Glucose / Insulin: no hx DM.  CBGs continue to be <150.  No SSI (d/c 5/1) ?Electrolytes: Na 142 (removed Na 4/24), K 3.9 (goal K >=4), Mag 2.1 (goal >=2), other lytes wnl ?Vitamins:  Vitamin C <0.1 (4/22), receiving 200 mg ascorbic acid daily in TPN from MVI ?Vitamin A 14.2 (4/22), receiving 1 mg of vitamin A daily in TPN from [facility] ?Unable to add additional ascorbic acid and vitamin A to TPN per TPN policy ?Renal: SCr trending down to 1.34, BUN down 66 ?Hepatic: LFTs WNL; T bili WNL ?- 5/1: TG 108, 5/4 TG 141 ?Intake / Output;  UOP 1.1 L/ 24h, Drains total 250 mL, Stool 120 mL ?- maintenance fluids:  NS @ KVO ?Net I/O yesterday: -541 mL ?GI Imaging:  ?4/15 CTA: bowel perforation ?4/17 KUB ileus or pSBO ?4/20 CTA: severe ileus or pSBO ? ?GI Surgeries / Procedures:  ?10/22/21 s/p EXPLORATORY LAPAROTOMY hartmans for perforated sigmoid colon ?4/23 IR placed drain LLQ ? ?Central access: CVC TL placed 10/23/21 ?TPN start date: 10/26/21 ? ?Nutritional Goals: ?Goal TPN rate is 90 mL/hr (provides 125 g of protein and ~ 2060 kcals per day)  ? ?RD Assessment: updated 5/2 as patient was intubated ?Estimated Needs ?Total Energy Estimated Needs: 1635 kcal ?Total Protein Estimated Needs: 110-125 grams ?Total Fluid Estimated Needs: >/= 3 L/day ? ?5/3: Discussed with RD - now that patient was extubated this AM, ok to target previous nutrition goals as listed below: ?Total Energy Estimated Needs: 1900-2150 kcal ?Total Protein Estimated Needs: 110-125 grams ?Total Fluid Estimated Needs: >/= 3 L/day ? ?Current Nutrition:  ?TPN  @ 80 ml/hr  ?NPO except for ice chips ? ?Plan:  ?Continue TPN @  90 mL/hr to provide 100% of goals ?Electrolytes in TPN:  ?Na 0 mEq/L ?K 80 mEq/L (increased) ?Ca 5 mEq/L ?Mg 6 mEq/L ?Phos 7 mmol/L ?Cl:Ac max Ac  ?Continue standard MVI and trace elements to TPN ?Continue folic acid & thiamine in TPN ?Monitor TPN labs on Mon/Thurs, BMET & magnesium in am ? ? ?Royetta Asal, PharmD, BCPS ?Clinical Pharmacist ?[place] ?Please utilize Amion for appropriate phone number to reach the unit pharmacist ([place]) ?11/11/2021 10:04 AM ? ? ? ? ?

## 2021-11-11 NOTE — Progress Notes (Signed)
? ? ?Referring Physician(s): Gross, S.  ? ?Supervising Physician: Aletta Edouard ? ?Patient Status:  [facility] - In-pt ? ?Chief Complaint: ? ?Perforated bowel s/p emergent ex lap with [place] and G tube placement on 1/44, recovery complicated by intraabdominal fluid collection development, s/p intraabdominal drain placement by Dr. Earleen Newport on 4/22. ? ?Subjective: ? ?Patient laying in bed, sleeping.  ?Opens eyes and mumbles, but not able to answer questions.  ? ?Allergies: ?Patient has no known allergies. ? ?Medications: ?Prior to Admission medications   ?Medication Sig Start Date End Date Taking? Authorizing Provider  ?fluticasone (FLONASE) 50 MCG/ACT nasal spray Place 1-2 sprays into both nostrils daily as needed for allergies or rhinitis.   Yes [provider]  ?loratadine (CLARITIN) 10 MG tablet Take 10 mg by mouth daily as needed for allergies.   Yes [provider]  ?Multiple Vitamins-Minerals (MULTIVITAMIN ADULTS 50+) TABS Take 1 tablet by mouth every morning.   Yes [provider]  ?ondansetron (ZOFRAN) 8 MG tablet Take 8 mg by mouth daily as needed for nausea/vomiting. 10/10/21  Yes [provider]  ?cetirizine (ZYRTEC) 10 MG tablet Take 10 mg by mouth daily as needed for allergies.    [provider]  ?diclofenac Sodium (VOLTAREN) 1 % GEL Apply 4 g topically 4 (four) times daily. ?Patient not taking: Reported on 10/22/2021 09/21/21   Deno Etienne, DO  ?mirtazapine (REMERON SOL-TAB) 15 MG disintegrating tablet Take 1 tablet (15 mg total) by mouth at bedtime. 12/12/17 01/11/18  Arrien, Jimmy Picket, MD  ?morphine (MSIR) 15 MG tablet Take 0.5 tablets (7.5 mg total) by mouth every 4 (four) hours as needed for severe pain. ?Patient not taking: Reported on 10/22/2021 09/26/21   Vanetta Mulders, MD  ? ? ? ?Vital Signs: ?BP (!) 141/94   Pulse (!) 109   Temp 98.2 ?F (36.8 ?C) (Oral)   Resp 17   Ht '5\' 10"'$  (1.778 m)   Wt 118 lb 9.7 oz (53.8 kg)   SpO2 98%   BMI 17.02 kg/m?   ? ?Physical Exam ?Vitals reviewed.  ?Constitutional:   ?   General: She is not in acute distress. ?   Appearance: She is ill-appearing.  ?HENT:  ?   Head: Normocephalic.  ?Cardiovascular:  ?   Rate and Rhythm: Normal rate and regular rhythm.  ?Pulmonary:  ?   Effort: Pulmonary effort is normal.  ?Abdominal:  ?   General: Abdomen is flat.  ?   Palpations: Abdomen is soft.  ?   Comments: + ostomy bag  ?Skin: ?   General: Skin is warm and dry.  ?   Coloration: Skin is not cyanotic or jaundiced.  ?   Comments: Positive LLQ drain to a suction bulb. Site is unremarkable with no erythema, edema, tenderness, bleeding or drainage. Suture and stat lock in place. No dressing. Trace of  clear fluid noted in the bulb. Drain does not aspirates and flushes with resistance.  ?  ? ? ?Imaging: ?CT ABDOMEN PELVIS W CONTRAST ? ?Result Date: 11/10/2021 ?CLINICAL DATA:  Abdominal pain, postop. Evaluate for fluid collection. Contrast via stoma to evaluate for possible colocutaneous fistula. EXAM: CT ABDOMEN AND PELVIS WITH CONTRAST TECHNIQUE: Multidetector CT imaging of the abdomen and pelvis was performed using the standard protocol following bolus administration of intravenous contrast. RADIATION DOSE REDUCTION: This exam was performed according to the departmental dose-optimization program which includes automated exposure control, adjustment of the mA and/or kV according to patient size and/or use of iterative reconstruction technique.  CONTRAST:  39m OMNIPAQUE IOHEXOL 300 MG/ML  SOLN COMPARISON:  11/04/2021 FINDINGS: Lower chest: Small left pleural effusion with lower lobe opacity and volume loss. Hepatobiliary: No focal liver abnormality.Cholelithiasis. Prominent gallbladder mucosal enhancement that is diffuse. No over distension or pericholecystic inflammation. Pancreas: Unremarkable. Spleen: Unremarkable. Adrenals/Urinary Tract: Negative adrenals. No hydronephrosis or stone. Unremarkable bladder. Stomach/Bowel: Descending colostomy  which was reportedly injected. Contrast opacifies the upstream colon and a ventral pelvic collection which contains the pigtail catheter, collection being approximately 7 cm in diameter and 1.3 cm in thickness. Visible fistula between the collection and the skin. Matting of bowel loops in the pelvis. No contrast opacifies the more posterior pelvic collection which measures up to 6 x 3.2 cm on axial images, similar to prior. There is a tubular area of rim enhancing fluid in the presacral region which is in retrospect stable from prior noncontrast CT. Vascular/Lymphatic: No acute vascular abnormality. Reproductive:No acute finding Other: No ascites or pneumoperitoneum. Musculoskeletal: No acute abnormalities. Advanced L2 height loss from old compression fracture. 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 IMPRESSION: 1. History of ostomy injection with distal colon opacification that is contiguous with the ventral collection and midline wound- a colocutaneous fistula. The percutaneous catheter resides within this collection. 2. Separate more posterior and deep pelvic collections, the larger measuring 6 x 3.2 cm. These do not opacify. 3. Small left pleural effusion with lower lobe atelectasis. Left lower lobe aeration is improved from prior CT. 4. Cholelithiasis. Electronically Signed   By: JJorje GuildM.D.   On: 11/10/2021 12:25  ? ?DG CHEST PORT 1 VIEW ? ?Result Date: 11/07/2021 ?CLINICAL DATA:  Status post intubation. EXAM: PORTABLE CHEST 1 VIEW COMPARISON:  Nov 07, 2021 (2:28 p.m.) FINDINGS: There is stable left-sided PICC line positioning. Interval endotracheal tube placement is noted with its distal tip approximally 3.7 cm from the carina. Persistent atelectasis is seen within the left lung base, with a small, stable left pleural effusion. The right lung remains clear. No pneumothorax is identified. The heart size and mediastinal contours are within normal limits. The  visualized skeletal structures are unremarkable. IMPRESSION: 1. Interval endotracheal tube placement positioning, as described above. 2. Persistent left basilar atelectasis, with a small, stable left pleural effusion. Electronically Signed   By: TVirgina NorfolkM.D.   On: 11/07/2021 19:30   ? ?Labs: ? ?CBC: ?Recent Labs  ?  11/07/21 ?0500 11/07/21 ?1832 11/08/21 ?0630105/03/23 ?0258 11/09/21 ?1545  ?WBC 9.6 13.5* 14.5* 13.0*  --   ?HGB 7.4* 8.2* 7.1* 6.6* 8.3*  ?HCT 23.5* 26.8* 22.3* 20.7* 25.4*  ?PLT 312 386 373 376  --   ? ? ?COAGS: ?Recent Labs  ?  10/22/21 ?2119 10/29/21 ?0650 10/30/21 ?0337  ?INR 1.3* 1.2 1.2  ? ? ?BMP: ?Recent Labs  ?  11/07/21 ?1919 11/08/21 ?0601005/03/23 ?0932305/04/23 ?0250  ?NA 142 139 139 141  ?K 4.5 4.5 4.3 3.6  ?CL 109 108 108 110  ?CO2 '26 25 24 24  '$ ?GLUCOSE 154* 146* 121* 128*  ?BUN 73* 73* 79* 66*  ?CALCIUM 8.3* 8.1* 8.3* 8.5*  ?CREATININE 1.67* 1.55* 1.73* 1.37*  ?GFRNONAA 35* 38* 33* 44*  ? ? ?LIVER FUNCTION TESTS: ?Recent Labs  ?  11/07/21 ?0850 11/07/21 ?1919 11/09/21 ?0258 11/10/21 ?0250  ?BILITOT 0.9 0.9 0.7 0.6  ?AST '22 20 18 18  '$ ?ALT '22 23 18 18  '$ ?ALKPHOS 98 96 86 90  ?PROT 6.7 6.8 6.4* 6.4*  ?ALBUMIN 2.1* 2.2*  1.9* 1.9*  ? ? ?Assessment and Plan: ? ?61 y.o. female with perforated bowel s/p emergent ex lap with Henderson [place] and G tube placement on 4/15, s/p intraabdominal drain placement by Dr. Earleen Newport on 4/22. ? ?IR was reached by [facility] for possible additional drain placement.  ?Case was reviewed by Dr. Kathlene Cote, NO additional drain placement possible due to size and the location of the new pelvic fluid collection.  ? ?Today exam showed drain functional, not clogged.  ?Does not aspirate, flushes with some resistance.  ?Trace of clear fluid in the bulb.  ?WBC trending down, w/in normal limit today  ? ?Drain Location: LLQ ?Size: Fr size: 10 Fr ?Date of placement: 4/22 ?Currently to: Drain collection device: suction bulb ?24 hour output:  ?Output by Drain (mL) 11/09/21 0701 -  11/09/21 1900 11/09/21 1901 - 11/10/21 0700 11/10/21 0701 - 11/10/21 1900 11/10/21 1901 - 11/11/21 0700 11/11/21 0701 - 11/11/21 0845  ?Closed System Drain Left Abdomen Bulb (JP) 10.2 Fr.   0    ?Gastrostomy/Enterostomy G

## 2021-11-11 NOTE — Progress Notes (Signed)
[facility] ?PROGRESS NOTE ? ? ? ?Progress Note  ?Jessica Jessica [name]  OVZ:[id number] DOB: 1961-05-03 DOA: 10/22/2021 ?PCP: Deland Pretty, MD  ? ? ? ?Brief Narrative:  ? ?Jessica Jessica [name] is an 62 y.o. female past medical history significant for alcohol abuse, with admissions for severe alcohol withdrawals requiring ICU admissions tobacco use presented to the ED on 10/22/2021 diffuse abdominal pain that started the day prior to admission.  CT of the abdomen pelvis showed pneumoperitoneum and free fluid, severe sepsis with started on broad-spectrum antibiotics fluid resuscitated surgery was consulted was taken emergently to the OR underwent exploratory laparotomy and was found to have a perforated sigmoid colon, status post Hartmann procedure and insertion of the G-tube which was left to drain to gravity, JP in pelvis was noted to have feculent peritonitis in the pelvis.  ICU course was complicated by Klebsiella bacteremia profound caloric protein malnutrition requiring TPN, acute respiratory failure with hypoxia and tachycardia with failure to progress ? ? ?Significant Events: ?10/22/21 ex lap, hartman's procedure, g tube placement. Extubated in OR ?4/17 Reintubated overnight with continued pressor support x2. 11 liters positive thus far with progressive AKI this am  ?4/18 Continued issues with electrolyte abnormalities overnight, remains on levo and neo. She is now 14L positive but urine output has increased  ?4/19 No issues overnight, remains 14L positive but urine output continues to improve. Start to slowly diureses today  ?4/20 weaning NE, extubated, diuresed, started trickle TF, remains on precedex for agitation  ?4/21 periods of agitation/ hallucinations, scheduled ativan helps, remains on precedex 0.7. CT A/P with suspicious for partial small bowel obstruction, with transition point in the lower central pelvis. Increased small to moderate amount of ascites, without definite focal abscess collection. Small amount  of postop free air also noted. New small left pleural effusion and left lower lobe atelectasis. New diffuse body wall edema, consistent with anasarca. Cholelithiasis.  ?4/22 On  TPN.  Did not tolerated TF yesterday -vomitted and now G tube to LIS with bilios returns.  Low grade fever +. WBC now normal. On Zosyn. Hgb < 7g% without overt bleeding - 1 U PRBC. delirium worse.  ?4/24 Remains off vasopressors, overnight with agitation requiring precedex, 5 mg Haldol, 2 mg versed ?4/25 Out of bed. SLP eval declined by patient as well as OT.  ?4/26 Off precedex. Seen by palliative care > pt requested full code. No family.  ?4/27 Transferred to [place] [facility]  ?5/1 PCCM called back for AMS, intubated for declining mental status and hypoxia. Back in Septic shock, working dx on-going abd sepsis and new aspiration PNA  ?5/2 Followed by IR and surgery both concerned about on-going intrabd infection. 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 Both teams wanting contrasted film but renal fxn barrier. ?5/3 got blood. Passed SBT->extubated  ? ?Assessment/Plan:  ? ?Severe septic shock Jessica [name] to perforated sigmoid colon s/p Hartmann/colostomy 10/22/2021/with G-tube placement 10/21/2021/.  Complicated by colocutaneous fistula with Pseudomonas intra-abdominal leak requiring percutaneous drainage: ?Low- quadrant drain management per IR that was placed on 10/29/2021, he is currently on IV Zosyn.  Antibiotic duration per surgery  ?Tmax of 99 ?CT scan of the abdomen pelvis showed ventralcollection and midline wound, drainage fistula which catheter resides within this collection.  There is also subacute urine collection 6x3 cm. ?Concern about an abscess. ? ?Septic shock secondary to aspiration pneumonia and ongoing abdominal infection: ?Has been weaned off pressors diuresis negative about 5-1/2 L. ?She continues to be negative. ? ?Acute respiratory failure with hypoxia secondary to aspiration  pneumonia: ?Extubated on 10/27/2021 Jessica [name] to altered mental status had to  be reintubated on 11/07/2021. ?Now extubated on 11/26/2021, as continues breathing trial. ?Currently n.p.o. try to limit sedation.now wean to room air. ?Physical therapy is on board. ? ?Acute encephalopathy multifactorial including but not limited to ICU delirium, infectious etiology, alcohol withdrawal: ?He has been off sedation, have only been using low-dose Ativan. ?And low-dose fentanyl for pain. ? ?Acute kidney injury secondary to severe septic shock: ?Was ob IV diuresis now negative about 1-1/2 L. ?Peaked 1.9 now improved this morning 1.3. ?Cont to hold diuresis. ?B-met pending ? ?Electrolyte imbalance: ?Potassium 3.6, magnesium 1.8. ?Try to keep potassium greater than 4 magnesium greater than 2. ? ?Normocytic anemia: ?No evidence of bleeding ?Status post 20 of packed red blood cells as his hemoglobin was less than 7, this morning 8.3. ?Continue to monitor hemoglobin intermittently. ? ?Severe protein caloric malnutrition/cachexia: ?Currently on TPN. ?G-tube placed on 10/22/2021, currently to suction ?Speech on board to evaluate. ?Estimated body mass index is 17.11 kg/m? as calculated from the following: ?  Height as of this encounter: _0  (1.778 m). ?  Weight as of this encounter: 54.1 kg. ? ?Right proximal humerus fracture: ?Status post fall in March 2023 seen by Ortho during that month with plan for nonweightbearing for 1 month then follow-up with them as an outpatient. ? ?Klebsiella bacteremia ?blood cultures on 10/23/2018 ?She was initially placed on vancomycin Flagyl and cefepime on 10/22/2021 ?De-escalated to IV Zosyn 10/24/2021, she has now completed 2-week course of IV antibiotics for bacteremia. ?Continue IV Zosyn per surgery for intra-abdominal infection ? ?Oropharyngeal dysphagia: ?Speech on board to evaluate. ? ?Hypernatremia//hyponatremia: ?Resolved with IV diuresis likely hypovolemic. ?Start d5w, b-met pending. ? ?Substance induced mood disorder (HCC)/ Anxiety ? ?Goals of care/ethics: ?She remains a  full code ?Palliative care has been consulted for complicated situation in regards to her surrogate decision-making ? ?Vertebral Pressure ulcer present ?RN Pressure Injury Documentation: ?Pressure Injury 10/27/21 Vertebral column Mid;Medial Deep Tissue Pressure Injury - Purple or maroon localized area of discolored intact skin or blood-filled blister Jessica [name] to damage of underlying soft tissue from pressure and/or shear. (Active)  ?10/27/21 0428  ?Location: Vertebral column  ?Location Orientation: Mid;Medial  ?Staging: Deep Tissue Pressure Injury - Purple or maroon localized area of discolored intact skin or blood-filled blister Jessica [name] to damage of underlying soft tissue from pressure and/or shear.  ?Wound Description (Comments):   ?Present on Admission: No  ?Dressing Type Foam - Lift dressing to assess site every shift 11/10/21 2000  ? ? ? ? ?DVT prophylaxis: lovenox ?Family Communication:none ?Status is: Inpatient ?Remains inpatient appropriate because: She has multiple medical problems including septic shock Jessica [name] to perforated bowels and aspiration ? ? ? ? ? ?Code Status:  ? ?  ?Code Status Orders  ?(From admission, onward)  ?  ? ? ?  ? ?  Start     Ordered  ? 10/22/21 1429  Full code  Continuous       ? 10/22/21 1431  ? ?  ?  ? ?  ? ?Code Status History   ? ? Date Active Date Inactive Code Status Order ID Comments User Context  ? 12/04/2017 2253 12/12/2017 1930 Full Code [id number]  Reyne Dumas, MD ED  ? ?  ? ? ? ? ?IV Access:  ? ?Peripheral IV ? ? ?Procedures and diagnostic studies:  ? ?CT ABDOMEN PELVIS W CONTRAST ? ?Result Date: 11/10/2021 ?CLINICAL DATA:  Abdominal pain, postop. Evaluate for fluid  collection. Contrast via stoma to evaluate for possible colocutaneous fistula. EXAM: CT ABDOMEN AND PELVIS WITH CONTRAST TECHNIQUE: Multidetector CT imaging of the abdomen and pelvis was performed using the standard protocol following bolus administration of intravenous contrast. RADIATION DOSE REDUCTION: This exam was  performed according to the departmental dose-optimization program which includes automated exposure control, adjustment of the mA and/or kV according to patient size and/or use of iterative reconstru

## 2021-11-11 NOTE — Progress Notes (Signed)
Nutrition Follow-up ? ?DOCUMENTATION CODES:  ? ?Non-severe (moderate) malnutrition in context of chronic illness, Underweight ? ?INTERVENTION:  ?- continue TPN per Pharmacist. ? ?- 500 mg ascorbic acid BID via G-tube and 100000 units vitamin A once/day via G tube; tube to be clamped x1 hour after administration then returned to suction.  ? ? ?NUTRITION DIAGNOSIS:  ? ?Moderate Malnutrition related to chronic illness (alcohol abuse) as evidenced by moderate fat depletion, moderate muscle depletion, severe muscle depletion. -ongoing ? ?GOAL:  ? ?Patient will meet greater than or equal to 90% of their needs -met with TPN regimen ? ?MONITOR:  ? ?Labs, Weight trends, Skin, I & O's, Other (Comment) (TPN regimen; ability to initiate TF via G-tube) ? ? ?ASSESSMENT:  ? ?61 year old female with medical history of tobacco abuse and alcohol use disorder with prior severe withdrawal/DT requiring ICU admission and precede. She presented to the ED due to severe, diffuse abdominal pain that began the night prior and constipation x4 days. She reported frequent alcohol ingestion and last drink was the day PTA. In ED she was found to have pneumoperitoneum and free fluid on CT abdomen/pelvis and severe lactic acidosis. She was started on broad spectrum antibiotics and has been given a total of 5L of crystalloid. Surgery was consulted and she was taken to OR where she underwent ex lap and was found to have perforated sigmoid colon, then had hartman's procedure and insertion of g-tube which was left to gravity drainage, JP bulb in pelvis. She was noted to have feculent peritonitis in the pelvis. ? ?Significant Events:  ?4/15- admission; ex lap with Hartman's procedure with colostomy and insertion of feeding G-tube d/t feculent peritonitis and sigmoid perforation ?4/17- re-intubated; OGT insertion; initial RD assessment ?4/18- OGT dislodgement ?4/19- TPN initiation ?4/20- initiation of TF at 20 ml/hr; extubation ?4/21- large volume emesis;  G-tube clamped and then placed to LIWS; drainage of LLQ fluid collectomy ?4/22- checked vitamins A, B12, C, and D with 0500 labs ?4/28- triple lumen PICC placed in L brachial ?5/1- re-intubation ?5/3- extubation ? ? ?Yesterday able to talk with Pharmacists, Palliative Care MD, RN, and patient. Patient was more alert and conversant than previous RD visits. She was able to recount some of the events of that morning and she was requesting juice.  ? ?Patient remains Full Code.  ? ?Able to talk with CCS PA concerning vitamin A and vitamin C lab results and request for supplementation. Surgery team clears for supplementation to be given via G-tube, tube to be clamped x1 hour, then returned to suction. ? ?Patient discussed in rounds, with Pharmacist, and with RN. All aware of orders for vitamin A and vitamin C and instructions for clamping after administration; this information is also in [employer].  ? ?She remains NPO and no plans for PO or enteral nutrition in the near future.  ? ?She is receiving TPN at goal rate of 90 ml/hr which is providing 2060 kcal and 125 grams protein. ? ?Weight has been stable since 4/26. Mild pitting edema to BLE and moderate pitting edema to BUE documented in the edema section of flow sheet.  ? ?Yesterday she underwent CT abdomen/pelvis for r/o fistula. IR note from today indicates consult for possible additional drain placement. Notes and discussion with CCS PA indicate high concern for fistula, possibly colocutaneous. ? ? ?Labs reviewed; CBGs: 120 and 119 mg/dl, BUN: 66 mg/dl, creatinine: 1.34 mg/dl, GFR: 45 ml/min.  ? ?Medications reviewed; 40 mg IV protonix/day. ? ?IVF; D5 @  50 ml/hr (204 kcal/24 hrs) x1 day. ? ? ? ?Diet Order:   ?Diet Order   ? ?       ?  Diet NPO time specified Except for: Ice Chips  Diet effective now       ?  ? ?  ?  ? ?  ? ? ?EDUCATION NEEDS:  ? ?Not appropriate for education at this time ? ?Skin:  Skin Assessment: Skin Integrity Issues: ?Skin Integrity  Issues:: DTI, Incisions ?DTI: vertebral column ?Incisions: abdomen (4/15 and 4/21) ? ?Last BM:  5/4 (120 ml via colostomy) ? ?Height:  ? ?Ht Readings from Last 1 Encounters:  ?11/08/21 _0  (1.778 m)  ? ? ?Weight:  ? ?Wt Readings from Last 1 Encounters:  ?11/11/21 53.8 kg  ? ? ? ?BMI:  Body mass index is 17.02 kg/m?. ? ?Estimated Nutritional Needs:  ?Kcal:  2000-2200 kcal ?Protein:  105-125 grams ?Fluid:  >/= 2.2 L/day ? ? ? ? ?Jarome Matin, MS, RD, LDN ?Registered Dietitian II ?Inpatient Clinical Nutrition ?RD pager # and on-call/weekend pager # available in [place]  ? ?

## 2021-11-11 NOTE — Progress Notes (Signed)
? ? ?20 Days Post-Op  ?Subjective: ?CC: ?No abdominal pain this morning. Some nausea.  ?Eakin's with 100cc/24 hours. Scant ostomy output. JP with scant output. G-tube bilious.  ? ?Objective: ?Vital signs in last 24 hours: ?Temp:  [97.5 ?F (36.4 ?C)-99 ?F (37.2 ?C)] 98.2 ?F (36.8 ?C) (05/05 0420) ?Pulse Rate:  [34-116] 109 (05/05 0700) ?Resp:  [11-25] 17 (05/05 0700) ?BP: (138-192)/(54-158) 141/94 (05/05 0700) ?SpO2:  [94 %-100 %] 98 % (05/05 0734) ?Weight:  [53.8 kg] 53.8 kg (05/05 0500) ?Last BM Date : 11/07/21 ? ?Intake/Output from previous day: ?05/04 0701 - 05/05 0700 ?In: 1308.7 [I.V.:1098.8; IV Piggyback:209.9] ?Out: 1850 [PNTIR:4431] ?Intake/Output this shift: ?No intake/output data recorded. ? ?PE: ?Gen:  Alert, NAD, pleasant ?Pulm: Rate and effort normal ?Abd: Mildly distended but soft. Appropriate ttp, greatest in the epigastrium and LUQ without peritonitis.  Gastrostomy in place with bilious drainage in canister. Drain in LLQ with scant serous looking fluid. Stoma pink budded and viable with small amount of liquid stool in ostomy bag. Eakins pouch with feculent drainage  ? ?Lab Results:  ?Recent Labs  ?  11/09/21 ?0258 11/09/21 ?1545  ?WBC 13.0*  --   ?HGB 6.6* 8.3*  ?HCT 20.7* 25.4*  ?PLT 376  --   ? ?BMET ?Recent Labs  ?  11/09/21 ?0258 11/10/21 ?0250  ?NA 139 141  ?K 4.3 3.6  ?CL 108 110  ?CO2 24 24  ?GLUCOSE 121* 128*  ?BUN 79* 66*  ?CREATININE 1.73* 1.37*  ?CALCIUM 8.3* 8.5*  ? ?PT/INR ?No results for input(s): LABPROT, INR in the last 72 hours. ?CMP  ?   ?Component Value Date/Time  ? NA 141 11/10/2021 0250  ? K 3.6 11/10/2021 0250  ? CL 110 11/10/2021 0250  ? CO2 24 11/10/2021 0250  ? GLUCOSE 128 (H) 11/10/2021 0250  ? BUN 66 (H) 11/10/2021 0250  ? CREATININE 1.37 (H) 11/10/2021 0250  ? CALCIUM 8.5 (L) 11/10/2021 0250  ? PROT 6.4 (L) 11/10/2021 0250  ? ALBUMIN 1.9 (L) 11/10/2021 0250  ? AST 18 11/10/2021 0250  ? ALT 18 11/10/2021 0250  ? ALKPHOS 90 11/10/2021 0250  ? BILITOT 0.6 11/10/2021 0250  ?  GFRNONAA 44 (L) 11/10/2021 0250  ? GFRAA >60 12/12/2017 0433  ? ?Lipase  ?   ?Component Value Date/Time  ? LIPASE 27 10/22/2021 1141  ? ? ?Studies/Results: ?CT ABDOMEN PELVIS W CONTRAST ? ?Result Date: 11/10/2021 ?CLINICAL DATA:  Abdominal pain, postop. Evaluate for fluid collection. Contrast via stoma to evaluate for possible colocutaneous fistula. EXAM: CT ABDOMEN AND PELVIS WITH CONTRAST TECHNIQUE: Multidetector CT imaging of the abdomen and pelvis was performed using the standard protocol following bolus administration of intravenous contrast. RADIATION DOSE REDUCTION: This exam was performed according to the departmental dose-optimization program which includes automated exposure control, adjustment of the mA and/or kV according to patient size and/or use of iterative reconstruction technique. CONTRAST:  68m OMNIPAQUE IOHEXOL 300 MG/ML  SOLN COMPARISON:  11/04/2021 FINDINGS: Lower chest: Small left pleural effusion with lower lobe opacity and volume loss. Hepatobiliary: No focal liver abnormality.Cholelithiasis. Prominent gallbladder mucosal enhancement that is diffuse. No over distension or pericholecystic inflammation. Pancreas: Unremarkable. Spleen: Unremarkable. Adrenals/Urinary Tract: Negative adrenals. No hydronephrosis or stone. Unremarkable bladder. Stomach/Bowel: Descending colostomy which was reportedly injected. Contrast opacifies the upstream colon and a ventral pelvic collection which contains the pigtail catheter, collection being approximately 7 cm in diameter and 1.3 cm in thickness. Visible fistula between the collection and the skin. Matting of bowel loops in  the pelvis. No contrast opacifies the more posterior pelvic collection which measures up to 6 x 3.2 cm on axial images, similar to prior. There is a tubular area of rim enhancing fluid in the presacral region which is in retrospect stable from prior noncontrast CT. Vascular/Lymphatic: No acute vascular abnormality. Reproductive:No acute  finding Other: No ascites or pneumoperitoneum. Musculoskeletal: No acute abnormalities. Advanced L2 height loss from old compression fracture. 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 IMPRESSION: 1. History of ostomy injection with distal colon opacification that is contiguous with the ventral collection and midline wound- a colocutaneous fistula. The percutaneous catheter resides within this collection. 2. Separate more posterior and deep pelvic collections, the larger measuring 6 x 3.2 cm. These do not opacify. 3. Small left pleural effusion with lower lobe atelectasis. Left lower lobe aeration is improved from prior CT. 4. Cholelithiasis. Electronically Signed   By: Jorje Guild M.D.   On: 11/10/2021 12:25   ? ?Anti-infectives: ?Anti-infectives (From admission, onward)  ? ? Start     Dose/Rate Route Frequency Ordered Stop  ? 11/04/21 1000  piperacillin-tazobactam (ZOSYN) IVPB 3.375 g       ? 3.375 g ?12.5 mL/hr over 240 Minutes Intravenous Every 8 hours 11/04/21 0935 11/11/21 1759  ? 10/27/21 1600  cefTRIAXone (ROCEPHIN) 2 g in sodium chloride 0.9 % 100 mL IVPB  Status:  Discontinued       ? 2 g ?200 mL/hr over 30 Minutes Intravenous Every 24 hours 10/27/21 0843 10/27/21 1106  ? 10/27/21 1600  piperacillin-tazobactam (ZOSYN) IVPB 3.375 g       ? 3.375 g ?12.5 mL/hr over 240 Minutes Intravenous Every 8 hours 10/27/21 1106 11/03/21 2000  ? 10/27/21 1400  metroNIDAZOLE (FLAGYL) IVPB 500 mg  Status:  Discontinued       ? 500 mg ?100 mL/hr over 60 Minutes Intravenous Every 12 hours 10/27/21 0843 10/27/21 1106  ? 10/24/21 1600  piperacillin-tazobactam (ZOSYN) IVPB 3.375 g  Status:  Discontinued       ? 3.375 g ?12.5 mL/hr over 240 Minutes Intravenous Every 8 hours 10/24/21 1422 10/27/21 0843  ? 10/24/21 1000  cefTRIAXone (ROCEPHIN) 2 g in sodium chloride 0.9 % 100 mL IVPB  Status:  Discontinued       ? 2 g ?200 mL/hr over 30 Minutes Intravenous Every 24 hours 10/24/21  0747 10/24/21 1357  ? 10/23/21 1530  vancomycin (VANCOCIN) IVPB 1000 mg/200 mL premix       ? 1,000 mg ?200 mL/hr over 60 Minutes Intravenous  Once 10/23/21 1439 10/23/21 1547  ? 10/23/21 0200  ceFEPIme (MAXIPIME) 2 g in sodium chloride 0.9 % 100 mL IVPB  Status:  Discontinued       ? 2 g ?200 mL/hr over 30 Minutes Intravenous Every 12 hours 10/22/21 2032 10/24/21 0747  ? 10/22/21 2200  cefOXitin (MEFOXIN) 2 g in sodium chloride 0.9 % 100 mL IVPB  Status:  Discontinued       ? 2 g ?200 mL/hr over 30 Minutes Intravenous Every 8 hours 10/22/21 1706 10/22/21 2032  ? 10/22/21 1956  vancomycin variable dose per unstable renal function (pharmacist dosing)  Status:  Discontinued       ?  Does not apply See admin instructions 10/22/21 1957 10/23/21 1917  ? 10/22/21 1245  vancomycin (VANCOCIN) IVPB 1000 mg/200 mL premix       ? 1,000 mg ?200 mL/hr over 60 Minutes Intravenous  Once 10/22/21 1241 10/22/21 1402  ?  10/22/21 1245  ceFEPIme (MAXIPIME) 2 g in sodium chloride 0.9 % 100 mL IVPB       ? 2 g ?200 mL/hr over 30 Minutes Intravenous  Once 10/22/21 1241 10/22/21 1321  ? 10/22/21 1230  metroNIDAZOLE (FLAGYL) IVPB 500 mg  Status:  Discontinued       ? 500 mg ?100 mL/hr over 60 Minutes Intravenous Every 12 hours 10/22/21 1222 10/24/21 1357  ? ?  ? ? ? ?Assessment/Plan ?POD#20 - PERFORATED SIGMOID COLON - status post EX LAP, HARTMAN'S PROCEDURE, Gastrostomy tube placement - 2/33/0076 Dr. Leighton Ruff ?OR FINDINGS: Purulent ascites throughout the abdomen and stool contamination in the pelvis due to necrotic perforated sigmoid colon ?- Status post drainage of LLQ fluid collection by IR - 4/22, Cx with pseudomonas ?- WOCN following for colostomy care and pouching ?- Midline wound with feculent appearing drainage - ? colocutaneous fistula. Con't Eakins pouch. CT 5/3 with distal colon opacification that is contiguous with the ventral collection and midline wound.  ?- CT 5/3 also with pelvic fluid collection measuring 6 x 3.2 cm.  Discussed with IR and not felt amenable/safe for drainage.  ?- Cont NPO, G-tube to LIWS, TPN, ABx ?- Repeat labs today ?  ?FEN - G-tube to LIWS, TPN for severe protein calorie malnutrition ?VTE - SCDs, [facility]

## 2021-11-11 NOTE — Progress Notes (Signed)
? ?                                                                                                                                                     ?                                                   ?Daily Progress Note  ? ?Patient Name: Jessica [name]       Date: 11/11/2021 ?DOB: 01/20/1961  Age: 61 y.o. MRN#: [id number] ?Attending Physician: Charlynne Cousins, MD ?Primary Care Physician: Deland Pretty, MD ?Admit Date: 10/22/2021 ? ?Reason for Consultation/Follow-up: Establishing goals of care ? ?Subjective: ?I saw and examined Jessica [name] today.  She was lying in bed in no distress.   ? ?We discussed her clinical course and plan for her care moving forward.  She remains hopeful for continued improvement and remains invested in plan for continuation of aggressive medical interventions. ? ?We also again discussed surrogate decision making.  She tells me that it is difficult for her to narrow down exactly who she would want to make decisions on her behalf.  She clearly tells me that she does not want her brother to be involved in her care at all.  She also says that she does not think that [name] would be the person that she would choose to make decisions on her behalf.  States that she has her thoughts narrowed down to 2 people Jessica [name] or Jessica [name]?).  Encouraged her to think about this and recommended that we complete HCPOA paperwork as soon as she has made a decision. ? ?Length of Stay: 20 ? ?Current Medications: ?Scheduled Meds:  ? vitamin C  500 mg Per Tube BID  ? chlorhexidine  15 mL Mouth Rinse BID  ? Chlorhexidine Gluconate Cloth  6 each Topical Daily  ? heparin injection (subcutaneous)  5,000 Units Subcutaneous Q8H  ? ipratropium-albuterol  3 mL Nebulization BID  ? mouth rinse  15 mL Mouth Rinse q12n4p  ? nicotine  14 mg Transdermal Daily  ? pantoprazole (PROTONIX) IV  40 mg Intravenous Q24H  ? sodium chloride flush  10-40 mL Intracatheter Q12H  ? sodium chloride flush  5 mL Intracatheter Q12H  ? vitamin A   10,000 Units Per Tube Daily  ? ? ?Continuous Infusions: ? sodium chloride Stopped (11/08/21 0755)  ? dextrose 50 mL/hr at 11/11/21 1700  ? norepinephrine (LEVOPHED) Adult infusion Stopped (11/09/21 1123)  ? TPN ADULT (ION) 90 mL/hr at 11/11/21 1700  ? TPN ADULT (ION)    ? ? ?PRN Meds: ?acetaminophen, antiseptic oral rinse, fentaNYL (SUBLIMAZE) injection, hydrALAZINE, lip balm, LORazepam, ondansetron (ZOFRAN) IV, sodium chloride flush ? ?Physical  Exam         ?Appears chronically ill and frail ?Extubated ?Has JP drain ?Has ostomy ?Appears with generalized weakness ? ?Vital Signs: BP (!) 182/90   Pulse (!) 112   Temp 98 ?F (36.7 ?C) (Oral)   Resp (!) 21   Ht '5\' 10"'$  (1.778 m)   Wt 53.8 kg   SpO2 99%   BMI 17.02 kg/m?  ?SpO2: SpO2: 99 % ?O2 Device: O2 Device: Room Air ?O2 Flow Rate: O2 Flow Rate (L/min): 2 L/min ? ?Intake/output summary:  ?Intake/Output Summary (Last 24 hours) at 11/11/2021 1758 ?Last data filed at 11/11/2021 1700 ?Gross per 24 hour  ?Intake 3330.88 ml  ?Output 1050 ml  ?Net 2280.88 ml  ? ? ?LBM: Last BM Date : 11/07/21 ?Baseline Weight: Weight: 54 kg ?Most recent weight: Weight: 53.8 kg ? ?     ?Palliative Assessment/Data: ? ? ? ? ? ?Patient Active Problem List  ? Diagnosis Date Noted  ? Severe sepsis due to perforated sigmoid colon s/p Hartmann/colostomy 10/22/2021 10/22/2021  ? Acute respiratory failure with hypoxia ([place]) 11/03/2021  ? Acute metabolic encephalopathy 94/70/9628  ? Oropharyngeal dysphagia 11/03/2021  ? Sinus tachycardia 11/05/2021  ? Normocytic anemia 11/04/2021  ? Hypernatremia, hyponatremia, hypokalemia, hyperphosphatemia 11/03/2021  ? Elevated brain natriuretic peptide (BNP) level 11/03/2021  ? Right proximal humeral fracture 11/03/2021  ? AKI (acute kidney injury) ([place]) 10/25/2021  ? Chronic pain 10/24/2021  ? Tobacco use disorder 10/24/2021  ? Uncontrolled hypertension 10/24/2021  ? Protein-calorie malnutrition, severe ([place]) 10/24/2021  ? Septic shock ([place]) 11/09/2021  ?  Postoperative intra-abdominal abscess 11/09/2021  ? 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 Malnutrition of moderate degree 11/07/2021  ? Palliative care by specialist   ? Goals of care, counseling/discussion   ? General weakness   ? Hypokalemia 11/03/2021  ? Hyperphosphatemia 11/03/2021  ? Hyponatremia 11/03/2021  ? Colostomy in place [facility]) 10/30/2021  ? Stricture and stenosis of esophagus 10/30/2021  ? Gastrostomy tube in place [facility]) 10/30/2021  ? Anxiety 10/24/2021  ? Allergic rhinitis 10/24/2021  ? 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 Underweight 10/24/2021  ? Acquired hammer toe of right foot 10/24/2021  ? Urticaria 10/24/2021  ? Dry mouth 10/24/2021  ? Substance induced mood disorder ([place])   ? ? ?Palliative Care Assessment & Plan  ? ?Patient Profile: ?  ? ?Assessment: ?61 year old lady with history of alcohol use disorder, tobacco use, recent right proximal humeral fracture, presented to the hospital at this time with diffuse abdominal pain.  Was found to have sepsis sigmoid perforation, was placed on antibiotics and underwent ex lap with Hartman's procedure and G-tube placement on 10-22-2021.  Postop course complicated by patient requiring reintubation and vasopressors on 4-17 and then she was extubated on 4-20.  Periodically has required Precedex due to agitation and hallucinations.  Found to have postop abdominal abscess requiring drainage on 4-22.  Started on TPN due to tube feed intolerance.  Weaned off of vasopressors as well as Precedex.  Hospital course complicated by feculent output from laparotomy site and repeat CT abdomen on 4-28 with new fluid collection within the cul-de-sac.  Remains on IV antibiotics and TPN.  Interventional radiology and general surgery are following.  Palliative medicine team consulted for ongoing goals of care discussions.  At the time of initial consultation patient had endorsed full code/full scope.  She had stated that she had survived her colon bursting inside of her and hence  wished to continue with any and all means to get better.  Hospital course complicated by patient having new fluid collection in her abdomen on repeat imaging as well as ongoing weakness. ? ?Recommendations/Plan: ?Continue current mode of care. ?Complicated situation in regard to surrogate decision making.  We will continue to discuss with her and hopefully there is consistency in her wishes.  I encouraged her to make a decision so we can work on completing healthcare power of attorney paperwork. ?Goals of Care and Additional Recommendations: ?Limitations on Scope of Treatment: Full Scope Treatment ? ?Code Status: ? ?  ?Code Status Orders  ?(From admission, onward)  ?  ? ? ?  ? ?  Start     Ordered  ? 10/22/21 1429  Full code  Continuous       ? 10/22/21 1431  ? ?  ?  ? ?  ? ?Code Status History   ? ? Date Active Date Inactive Code Status Order ID Comments User Context  ? 12/04/2017 2253 12/12/2017 1930 Full Code [id number]  Reyne Dumas, MD ED  ? ?  ? ? ?Prognosis: ? Guarded  ? ?Discharge Planning: ?To Be Determined based on hospital course.  ? ?Care plan was discussed with RN. ? ?Thank you for allowing the Palliative Medicine Team to assist in the care of this patient. ? ?Micheline Rough, MD ? ?Please contact Palliative Medicine Team phone at [phone] for questions and concerns.  ? ? ? ? ? ?

## 2021-11-11 NOTE — TOC Progression Note (Signed)
Transition of Care (TOC) - Progression Note  ? ? ?Patient Details  ?Name: Jessica [name] ?MRN: [id number] ?Date of Birth: September 30, 1960 ? ?Transition of Care (TOC) CM/SW Contact  ?Demetrios Byron, LCSW ?Phone Number: ?11/11/2021, 10:23 AM ? ?Clinical Narrative:    ?TOC continuing to monitor medical progression and to assist with dc planning as appropriate.  At this time, we continue to anticipate need for SNF. ? ? ?Expected Discharge Plan: Home/Self Care (TBD) ?Barriers to Discharge: Continued Medical Work up ? ?Expected Discharge Plan and Services ?Expected Discharge Plan: Home/Self Care (TBD) ?  ?Discharge Planning Services: CM Consult ?  ?Living arrangements for the past 2 months: [place] ?                ?  ?  ?  ?  ?  ?  ?  ?  ?  ?  ? ? ?Social Determinants of Health (SDOH) Interventions ?  ? ?Readmission Risk Interventions ? ?  10/24/2021  ? 10:10 AM  ?Readmission Risk Prevention Plan  ?Transportation Screening Complete  ?PCP or Specialist Appt within 3-5 Days Complete  ?[place] or Home Care Consult Complete  ?Social Work Consult for [place] Planning/Counseling Complete  ?Palliative Care Screening Complete  ?Medication Review [employer] photographer) Complete  ? ? ?

## 2021-11-12 LAB — BASIC METABOLIC PANEL
Anion gap: 9 (ref 5–15)
BUN: 64 mg/dL — ABNORMAL HIGH (ref 6–20)
CO2: 23 mmol/L (ref 22–32)
Calcium: 8.8 mg/dL — ABNORMAL LOW (ref 8.9–10.3)
Chloride: 105 mmol/L (ref 98–111)
Creatinine, Ser: 1.26 mg/dL — ABNORMAL HIGH (ref 0.44–1.00)
GFR, Estimated: 49 mL/min — ABNORMAL LOW (ref >60)
Glucose, Bld: 123 mg/dL — ABNORMAL HIGH (ref 70–99)
Potassium: 4.3 mmol/L (ref 3.5–5.1)
Sodium: 137 mmol/L (ref 135–145)

## 2021-11-12 LAB — GLUCOSE, CAPILLARY
Glucose-Capillary: 127 mg/dL — ABNORMAL HIGH (ref 70–99)
Glucose-Capillary: 120 mg/dL — ABNORMAL HIGH (ref 70–99)
Glucose-Capillary: 99 mg/dL (ref 70–99)

## 2021-11-12 LAB — MAGNESIUM: Magnesium: 1.8 mg/dL (ref 1.7–2.4)

## 2021-11-12 MED ORDER — METOPROLOL TARTRATE 25 MG PO TABS
25.0000 mg | ORAL_TABLET | Freq: Two times a day (BID) | ORAL | Status: DC
  Administered 2021-11-12 (×2): 25 mg via ORAL
  Filled 2021-11-12 (×2): qty 1

## 2021-11-12 MED ORDER — MAGNESIUM SULFATE 2 GM/50ML IV SOLN
2.0000 g | Freq: Once | INTRAVENOUS | Status: AC
  Administered 2021-11-12: 2 g via INTRAVENOUS
  Filled 2021-11-12: qty 50

## 2021-11-12 MED ORDER — TRAVASOL 10 % IV SOLN
INTRAVENOUS | Status: AC
  Filled 2021-11-12: qty 1252.8

## 2021-11-12 NOTE — Progress Notes (Signed)
? ? ?Assessment & Plan: ?POD#21 - PERFORATED SIGMOID COLON - status post EX LAP, HARTMAN'S PROCEDURE, Gastrostomy tube placement - 6/73/4193 Dr. Leighton Ruff ?OR FINDINGS: Purulent ascites throughout the abdomen and stool contamination in the pelvis due to necrotic perforated sigmoid colon ?- Status post drainage of LLQ fluid collection by IR - 4/22, Cx with pseudomonas ?- WOCN following for colostomy care and pouching of fistula ?- Midline wound with colocutaneous fistula. Con't Eakins pouch.  ?- CT 5/3 also with pelvic fluid collection measuring 6 x 3.2 cm. Discussed with IR and not felt amenable/safe for drainage.  ?- Cont NPO, G-tube to gravity, TPN, ABx ?  ?FEN - G-tube to gravity, TPN ?VTE - SCDs, SQH ?ID - zosyn 4/17 - 4/27; 4/28 >> present ? ?Discussed with nurse at bedside.  Continue present management.  Will place gastrostomy to gravity bag today.  Allow ice chips. ? ?      Armandina Gemma, MD ?[facility] ?A [facility] practice ?Office: [phone] ?       ?Chief Complaint: ?Colonic perforation with fecal peritonitis ? ?Subjective: ?Patient awakens to voice, resists exam.  Wants ice chips. ? ?Objective: ?Vital signs in last 24 hours: ?Temp:  [97.9 ?F (36.6 ?C)-99.6 ?F (37.6 ?C)] 98.3 ?F (36.8 ?C) (05/06 0400) ?Pulse Rate:  [103-121] 107 (05/06 0600) ?Resp:  [14-23] 19 (05/06 0600) ?BP: (119-182)/(64-97) 153/73 (05/06 0600) ?SpO2:  [95 %-100 %] 97 % (05/06 0600) ?Weight:  [53.1 kg] 53.1 kg (05/06 0341) ?Last BM Date : 11/07/21 ? ?Intake/Output from previous day: ?05/05 0701 - 05/06 0700 ?In: 4203.2 [I.V.:4053.4; IV Piggyback:149.8] ?Out: 2195 [HGDJM:4268; Drains:375; Stool:170] ?Intake/Output this shift: ?No intake/output data recorded. ? ?Physical Exam: ?HEENT - sclerae clear, mucous membranes moist ?Neck - soft ?Abdomen - soft, non-distended; feculent material in Eakin's pouch and ostomy bag; minimal from gastrostomy tube on suction ?Ext - no edema, non-tender ? ?Lab Results:  ?Recent Labs  ?   11/09/21 ?1545 11/11/21 ?1013  ?WBC  --  10.2  ?HGB 8.3* 8.7*  ?HCT 25.4* 26.5*  ?PLT  --  501*  ? ?BMET ?Recent Labs  ?  11/11/21 ?0853 11/12/21 ?3419  ?NA 142 137  ?K 3.9 4.3  ?CL 109 105  ?CO2 22 23  ?GLUCOSE 113* 123*  ?BUN 66* 64*  ?CREATININE 1.34* 1.26*  ?CALCIUM 9.2 8.8*  ? ?PT/INR ?No results for input(s): LABPROT, INR in the last 72 hours. ?Comprehensive Metabolic Panel: ?   ?Component Value Date/Time  ? NA 137 11/12/2021 0511  ? NA 142 11/11/2021 0853  ? K 4.3 11/12/2021 0511  ? K 3.9 11/11/2021 0853  ? CL 105 11/12/2021 0511  ? CL 109 11/11/2021 0853  ? CO2 23 11/12/2021 0511  ? CO2 22 11/11/2021 0853  ? BUN 64 (H) 11/12/2021 0511  ? BUN 66 (H) 11/11/2021 0853  ? CREATININE 1.26 (H) 11/12/2021 0511  ? CREATININE 1.34 (H) 11/11/2021 0853  ? GLUCOSE 123 (H) 11/12/2021 0511  ? GLUCOSE 113 (H) 11/11/2021 0853  ? CALCIUM 8.8 (L) 11/12/2021 0511  ? CALCIUM 9.2 11/11/2021 0853  ? AST 18 11/10/2021 0250  ? AST 18 11/09/2021 0258  ? ALT 18 11/10/2021 0250  ? ALT 18 11/09/2021 0258  ? ALKPHOS 90 11/10/2021 0250  ? ALKPHOS 86 11/09/2021 0258  ? BILITOT 0.6 11/10/2021 0250  ? BILITOT 0.7 11/09/2021 0258  ? PROT 6.4 (L) 11/10/2021 0250  ? PROT 6.4 (L) 11/09/2021 0258  ? ALBUMIN 1.9 (L) 11/10/2021 0250  ? ALBUMIN 1.9 (  L) 11/09/2021 0258  ? ? ?Studies/Results: ?CT ABDOMEN PELVIS W CONTRAST ? ?Result Date: 11/10/2021 ?CLINICAL DATA:  Abdominal pain, postop. Evaluate for fluid collection. Contrast via stoma to evaluate for possible colocutaneous fistula. EXAM: CT ABDOMEN AND PELVIS WITH CONTRAST TECHNIQUE: Multidetector CT imaging of the abdomen and pelvis was performed using the standard protocol following bolus administration of intravenous contrast. RADIATION DOSE REDUCTION: This exam was performed according to the departmental dose-optimization program which includes automated exposure control, adjustment of the mA and/or kV according to patient size and/or use of iterative reconstruction technique. CONTRAST:  66m  OMNIPAQUE IOHEXOL 300 MG/ML  SOLN COMPARISON:  11/04/2021 FINDINGS: Lower chest: Small left pleural effusion with lower lobe opacity and volume loss. Hepatobiliary: No focal liver abnormality.Cholelithiasis. Prominent gallbladder mucosal enhancement that is diffuse. No over distension or pericholecystic inflammation. Pancreas: Unremarkable. Spleen: Unremarkable. Adrenals/Urinary Tract: Negative adrenals. No hydronephrosis or stone. Unremarkable bladder. Stomach/Bowel: Descending colostomy which was reportedly injected. Contrast opacifies the upstream colon and a ventral pelvic collection which contains the pigtail catheter, collection being approximately 7 cm in diameter and 1.3 cm in thickness. Visible fistula between the collection and the skin. Matting of bowel loops in the pelvis. No contrast opacifies the more posterior pelvic collection which measures up to 6 x 3.2 cm on axial images, similar to prior. There is a tubular area of rim enhancing fluid in the presacral region which is in retrospect stable from prior noncontrast CT. Vascular/Lymphatic: No acute vascular abnormality. Reproductive:No acute finding Other: No ascites or pneumoperitoneum. Musculoskeletal: No acute abnormalities. Advanced L2 height loss from old compression fracture. 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 IMPRESSION: 1. History of ostomy injection with distal colon opacification that is contiguous with the ventral collection and midline wound- a colocutaneous fistula. The percutaneous catheter resides within this collection. 2. Separate more posterior and deep pelvic collections, the larger measuring 6 x 3.2 cm. These do not opacify. 3. Small left pleural effusion with lower lobe atelectasis. Left lower lobe aeration is improved from prior CT. 4. Cholelithiasis. Electronically Signed   By: JJorje GuildM.D.   On: 11/10/2021 12:25   ? ? ? ?TArmandina Gemma?11/12/2021 ? ?  ?

## 2021-11-12 NOTE — Progress Notes (Signed)
[facility] ?PROGRESS NOTE ? ? ? ?Progress Note  ?Jessica [name]  IRS:[id number] DOB: 1960-12-28 DOA: 10/22/2021 ?PCP: Deland Pretty, MD  ? ? ? ?Brief Narrative:  ? ?Jessica [name] is an 61 y.o. female past medical history significant for alcohol abuse, with admissions for severe alcohol withdrawals requiring ICU admissions tobacco use presented to the ED on 10/22/2021 diffuse abdominal pain that started the day prior to admission.  CT of the abdomen pelvis showed pneumoperitoneum and free fluid, severe sepsis with started on broad-spectrum antibiotics fluid resuscitated surgery was consulted was taken emergently to the OR underwent exploratory laparotomy and was found to have a perforated sigmoid colon, status post Hartmann procedure and insertion of the G-tube which was left to drain to gravity, JP in pelvis was noted to have feculent peritonitis in the pelvis.  ICU course was complicated by Klebsiella bacteremia profound caloric protein malnutrition requiring TPN, acute respiratory failure with hypoxia and tachycardia with failure to progress ? ? ?Significant Events: ?10/22/21 ex lap, hartman's procedure, g tube placement. Extubated in OR ?4/17 Reintubated overnight with continued pressor support x2. 11 liters positive thus far with progressive AKI this am  ?4/18 Continued issues with electrolyte abnormalities overnight, remains on levo and neo. She is now 14L positive but urine output has increased  ?4/19 No issues overnight, remains 14L positive but urine output continues to improve. Start to slowly diureses today  ?4/20 weaning NE, extubated, diuresed, started trickle TF, remains on precedex for agitation  ?4/21 periods of agitation/ hallucinations, scheduled ativan helps, remains on precedex 0.7. CT A/P with suspicious for partial small bowel obstruction, with transition point in the lower central pelvis. Increased small to moderate amount of ascites, without definite focal abscess collection. Small amount  of postop free air also noted. New small left pleural effusion and left lower lobe atelectasis. New diffuse body wall edema, consistent with anasarca. Cholelithiasis.  ?4/22 On  TPN.  Did not tolerated TF yesterday -vomitted and now G tube to LIS with bilios returns.  Low grade fever +. WBC now normal. On Zosyn. Hgb < 7g% without overt bleeding - 1 U PRBC. delirium worse.  ?4/24 Remains off vasopressors, overnight with agitation requiring precedex, 5 mg Haldol, 2 mg versed ?4/25 Out of bed. SLP eval declined by patient as well as OT.  ?4/26 Off precedex. Seen by palliative care > pt requested full code. No family.  ?4/27 Transferred to [place]  ?5/1 PCCM called back for AMS, intubated for declining mental status and hypoxia. Back in Septic shock, working dx on-going abd sepsis and new aspiration PNA  ?5/2 Followed by IR and surgery both concerned about on-going intrabd infection. 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 Both teams wanting contrasted film but renal fxn barrier. ?5/3 got blood. Passed SBT->extubated  ?5.4 ct abd and pelvis  CT scan of the abdomen pelvis showed ventralcollection and midline wound, drainage fistula which catheter resides within this collection.  ? ?Assessment/Plan:  ? ?Severe septic shock due to perforated sigmoid colon s/p Hartmann/colostomy 10/22/2021/with G-tube placement 10/21/2021/.  Complicated by colocutaneous fistula with Pseudomonas intra-abdominal leak requiring percutaneous drainage: ?Low- quadrant drain management per IR that was placed on 10/29/2021, he is currently on IV Zosyn.  Antibiotic duration per surgery  ?Tmax of 97.6. ?IR related fluid collection high risk for percutaneous approach. ?Keep NPO and TNA.  ?Midline draining, appear to be decreasing. ? ?Septic shock secondary to aspiration pneumonia and ongoing abdominal infection: ?Has been weaned off pressors , bow about 2 L +. ?  KVO IV fluids. ?She continues to be negative. ? ?Acute respiratory failure with hypoxia secondary to  aspiration pneumonia: ?Extubated on 10/27/2021 due to altered mental status had to be reintubated on 11/07/2021. ?Now extubated on 11/26/2021, as continues breathing trial. ?Currently n.p.o. try to limit sedation.now wean to room air. ?Physical therapy is on board. Will need SNF. ? ?Acute encephalopathy multifactorial including but not limited to ICU delirium, infectious etiology, alcohol withdrawal: ?He has been off sedation, have only been using low-dose Ativan. ?And low-dose fentanyl for pain. ? ?Acute kidney injury secondary to severe septic shock: ?Was ob IV diuresis now negative about 1-1/2 L. ?Peaked 1.9 now improved this morning 1.3. ?KVO IV fluids ? ?Electrolyte imbalance: ?Potassium 3.6, magnesium 1.8. ?Try to keep potassium greater than 4 magnesium greater than 2. ? ?Normocytic anemia: ?No evidence of bleeding ?Status post 20 of packed red blood cells as his hemoglobin was less than 7, this morning 8.3. ?Continue to monitor hemoglobin intermittently. ? ?Severe protein caloric malnutrition/cachexia: ?Currently on TPN. ?G-tube placed on 10/22/2021, currently to suction ?Speech on board to evaluate. ?Estimated body mass index is 17.11 kg/m? as calculated from the following: ?  Height as of this encounter: '5\' 10"'  (1.778 m). ?  Weight as of this encounter: 54.1 kg. ? ?Right proximal humerus fracture: ?Status post fall in March 2023 seen by Ortho during that month with plan for nonweightbearing for 1 month then follow-up with them as an outpatient. ? ?Klebsiella bacteremia ?blood cultures on 10/23/2018 ?She was initially placed on vancomycin Flagyl and cefepime on 10/22/2021 ?De-escalated to IV Zosyn 10/24/2021, she has now completed 2-week course of IV antibiotics for bacteremia. ?Continue IV Zosyn per surgery for intra-abdominal infection ? ?Oropharyngeal dysphagia: ?Speech on board to evaluate. ? ?Hypernatremia//hyponatremia: ?Resolved with IV diuresis likely hypovolemic. ?Start d5w, b-met pending. ? ?Substance  induced mood disorder (HCC)/ Anxiety ? ?Goals of care/ethics: ?She remains a full code ?Palliative care has been consulted for complicated situation in regards to her surrogate decision-making ? ?Vertebral Pressure ulcer present ?RN Pressure Injury Documentation: ?Pressure Injury 10/27/21 Vertebral column Mid;Medial Deep Tissue Pressure Injury - Purple or maroon localized area of discolored intact skin or blood-filled blister due to damage of underlying soft tissue from pressure and/or shear. (Active)  ?10/27/21 0428  ?Location: Vertebral column  ?Location Orientation: Mid;Medial  ?Staging: Deep Tissue Pressure Injury - Purple or maroon localized area of discolored intact skin or blood-filled blister due to damage of underlying soft tissue from pressure and/or shear.  ?Wound Description (Comments):   ?Present on Admission: No  ?Dressing Type Foam - Lift dressing to assess site every shift 11/12/21 0400  ? ? ? ? ?DVT prophylaxis: lovenox ?Family Communication:none ?Status is: Inpatient ?Remains inpatient appropriate because: She has multiple medical problems including septic shock due to perforated bowels and aspiration ? ? ? ? ? ?Code Status:  ? ?  ?Code Status Orders  ?(From admission, onward)  ?  ? ? ?  ? ?  Start     Ordered  ? 10/22/21 1429  Full code  Continuous       ? 10/22/21 1431  ? ?  ?  ? ?  ? ?Code Status History   ? ? Date Active Date Inactive Code Status Order ID Comments User Context  ? 12/04/2017 2253 12/12/2017 1930 Full Code [id number]  Reyne Dumas, MD ED  ? ?  ? ? ? ? ?IV Access:  ? ?Peripheral IV ? ? ?Procedures and diagnostic studies:  ? ?CT  ABDOMEN PELVIS W CONTRAST ? ?Result Date: 11/10/2021 ?CLINICAL DATA:  Abdominal pain, postop. Evaluate for fluid collection. Contrast via stoma to evaluate for possible colocutaneous fistula. EXAM: CT ABDOMEN AND PELVIS WITH CONTRAST TECHNIQUE: Multidetector CT imaging of the abdomen and pelvis was performed using the standard protocol following bolus  administration of intravenous contrast. RADIATION DOSE REDUCTION: This exam was performed according to the departmental dose-optimization program which includes automated exposure control, adjustment of th

## 2021-11-12 NOTE — Progress Notes (Addendum)
PHARMACY - TOTAL PARENTERAL NUTRITION CONSULT NOTE  ? ?Indication: Prolonged ileus ? ?Patient Measurements: ?Height: '5\' 10"'$  (177.8 cm) ?Weight: 53.1 kg (117 lb 1 oz) ?IBW/kg (Calculated) : 68.5 ?TPN AdjBW (KG): 53.8 ?Body mass index is 16.8 kg/m?. ?Usual Weight: 53.8 kg ? ?Assessment: 61 yo F w/ PMH etoh use disorder, smoker, HTN, GERD.  S/p Exp Lap Hartmans 10/22/2021 for perforated sigmoid colon, Dr. Marcello Moores.   ? ?Glucose / Insulin: no hx DM.  CBGs continue to be <150.  No SSI (d/c 5/1) ?Electrolytes: Na 137 (removed Na 4/24), K 4.3 (goal K >=4), Mag 1.8 (goal >=2), other lytes wnl ?Vitamins:  Vitamin C <0.1 (4/22), receiving 200 mg ascorbic acid daily in TPN from MVI ?Vitamin A 14.2 (4/22), receiving 1 mg of vitamin A daily in TPN from MVI ?Unable to add additional ascorbic acid and vitamin A to TPN per TPN policy ?5/5 - initiated ascorbic acid 500 mg per tube BID and vitamin A 10,000 units per tube daily ?Renal: SCr trending down to 1.26, BUN down 64 ?Hepatic: LFTs WNL; T bili WNL ?- 5/1: TG 108, 5/4 TG 141 ?Intake / Output;  UOP 1.8 L/ 24h, Drains total 375 mL, Stool 170 mL ?- maintenance fluids:  NS @ KVO ?Net I/O yesterday: +2008.2 mL ?GI Imaging:  ?4/15 CTA: bowel perforation ?4/17 KUB ileus or pSBO ?4/20 CTA: severe ileus or pSBO ? ?GI Surgeries / Procedures:  ?10/22/21 s/p EXPLORATORY LAPAROTOMY hartmans for perforated sigmoid colon ?4/23 IR placed drain LLQ ? ?Central access: CVC TL placed 10/23/21 ?TPN start date: 10/26/21 ? ?Nutritional Goals: ?Goal TPN rate is 90 mL/hr (provides 125 g of protein and ~ 2060 kcals per day)  ? ?RD Assessment: updated 5/2 as patient was intubated ?Estimated Needs ?Total Energy Estimated Needs: 2000-2200 kcal ?Total Protein Estimated Needs: 105-125 grams ?Total Fluid Estimated Needs: >/= 2.2 L/day ? ?5/3: Discussed with RD - now that patient was extubated this AM, ok to target previous nutrition goals as listed below: ?Total Energy Estimated Needs: 1900-2150 kcal ?Total Protein  Estimated Needs: 110-125 grams ?Total Fluid Estimated Needs: >/= 3 L/day ? ?Current Nutrition:  ?TPN @ 80 ml/hr  ?NPO except for ice chips ? ?Plan:  ?Magnesium 2 g IV x 1 ? ?Continue TPN @  90 mL/hr to provide 100% of goals ?Electrolytes in TPN:  ?Na 0 mEq/L ?K 80 mEq/L ?Ca 5 mEq/L ?Mg 8 mEq/L (increased) ?Phos 7 mmol/L ?Cl:Ac max Ac  ?Continue standard MVI and trace elements to TPN ?Continue folic acid & thiamine in TPN ?Monitor TPN labs on Mon/Thurs, BMET & magnesium in am ? ? ?Royetta Asal, PharmD, BCPS ?Clinical Pharmacist ?[place] ?Please utilize Amion for appropriate phone number to reach the unit pharmacist ([place]) ?11/12/2021 7:29 AM ? ? ? ? ?

## 2021-11-13 LAB — BASIC METABOLIC PANEL
Anion gap: 8 (ref 5–15)
BUN: 62 mg/dL — ABNORMAL HIGH (ref 6–20)
CO2: 23 mmol/L (ref 22–32)
Calcium: 8.8 mg/dL — ABNORMAL LOW (ref 8.9–10.3)
Chloride: 105 mmol/L (ref 98–111)
Creatinine, Ser: 1.19 mg/dL — ABNORMAL HIGH (ref 0.44–1.00)
GFR, Estimated: 52 mL/min — ABNORMAL LOW (ref >60)
Glucose, Bld: 112 mg/dL — ABNORMAL HIGH (ref 70–99)
Potassium: 4.8 mmol/L (ref 3.5–5.1)
Sodium: 136 mmol/L (ref 135–145)

## 2021-11-13 LAB — GLUCOSE, CAPILLARY
Glucose-Capillary: 110 mg/dL — ABNORMAL HIGH (ref 70–99)
Glucose-Capillary: 108 mg/dL — ABNORMAL HIGH (ref 70–99)
Glucose-Capillary: 110 mg/dL — ABNORMAL HIGH (ref 70–99)
Glucose-Capillary: 110 mg/dL — ABNORMAL HIGH (ref 70–99)
Glucose-Capillary: 122 mg/dL — ABNORMAL HIGH (ref 70–99)

## 2021-11-13 LAB — MAGNESIUM: Magnesium: 2.2 mg/dL (ref 1.7–2.4)

## 2021-11-13 MED ORDER — METOPROLOL TARTRATE 5 MG/5ML IV SOLN
2.5000 mg | Freq: Two times a day (BID) | INTRAVENOUS | Status: DC
  Administered 2021-11-13 – 2021-11-16 (×8): 2.5 mg via INTRAVENOUS
  Filled 2021-11-13 (×8): qty 5

## 2021-11-13 MED ORDER — TRAVASOL 10 % IV SOLN
INTRAVENOUS | Status: DC
  Filled 2021-11-13: qty 1252.8

## 2021-11-13 NOTE — Progress Notes (Signed)
Patient refused CPT at this time.  

## 2021-11-13 NOTE — Progress Notes (Signed)
[facility] ?PROGRESS NOTE ? ? ? ?Progress Note  ?Jessica [name]  YBF:[id number] DOB: 04-14-1961 DOA: 10/22/2021 ?PCP: Deland Pretty, MD  ? ? ? ?Brief Narrative:  ? ?Jessica [name] is an 61 y.o. female past medical history significant for alcohol abuse, with admissions for severe alcohol withdrawals requiring ICU admissions tobacco use presented to the ED on 10/22/2021 diffuse abdominal pain that started the day prior to admission.  CT of the abdomen pelvis showed pneumoperitoneum and free fluid, severe sepsis with started on broad-spectrum antibiotics fluid resuscitated surgery was consulted was taken emergently to the OR underwent exploratory laparotomy and was found to have a perforated sigmoid colon, status post Hartmann procedure and insertion of the G-tube which was left to drain to gravity, JP in pelvis was noted to have feculent peritonitis in the pelvis.  ICU course was complicated by Klebsiella bacteremia profound caloric protein malnutrition requiring TPN, acute respiratory failure with hypoxia and tachycardia with failure to progress ? ? ?Significant Events: ?10/22/21 ex lap, hartman's procedure, g tube placement. Extubated in OR ?4/17 Reintubated overnight with continued pressor support x2. 11 liters positive thus far with progressive AKI this am  ?4/18 Continued issues with electrolyte abnormalities overnight, remains on levo and neo. She is now 14L positive but urine output has increased  ?4/19 No issues overnight, remains 14L positive but urine output continues to improve. Start to slowly diureses today  ?4/20 weaning NE, extubated, diuresed, started trickle TF, remains on precedex for agitation  ?4/21 periods of agitation/ hallucinations, scheduled ativan helps, remains on precedex 0.7. CT A/P with suspicious for partial small bowel obstruction, with transition point in the lower central pelvis. Increased small to moderate amount of ascites, without definite focal abscess collection. Small amount  of postop free air also noted. New small left pleural effusion and left lower lobe atelectasis. New diffuse body wall edema, consistent with anasarca. Cholelithiasis.  ?4/22 On  TPN.  Did not tolerated TF yesterday -vomitted and now G tube to LIS with bilios returns.  Low grade fever +. WBC now normal. On Zosyn. Hgb < 7g% without overt bleeding - 1 U PRBC. delirium worse.  ?4/24 Remains off vasopressors, overnight with agitation requiring precedex, 5 mg Haldol, 2 mg versed ?4/25 Out of bed. SLP eval declined by patient as well as OT.  ?4/26 Off precedex. Seen by palliative care > pt requested full code. No family.  ?4/27 Transferred to [place]  ?5/1 PCCM called back for AMS, intubated for declining mental status and hypoxia. Back in Septic shock, working dx on-going abd sepsis and new aspiration PNA  ?5/2 Followed by IR and surgery both concerned about on-going intrabd infection. 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 Both teams wanting contrasted film but renal fxn barrier. ?5/3 got blood. Passed SBT->extubated  ?5/4 ct abd and pelvis  CT scan of the abdomen pelvis showed ventralcollection and midline wound, drainage fistula which catheter resides within this collection.  ? ?Assessment/Plan:  ? ?Severe septic shock due to perforated sigmoid colon s/p Hartmann/colostomy 10/22/2021/with G-tube placement 10/21/2021/.  Complicated by colocutaneous fistula with Pseudomonas intra-abdominal leak requiring percutaneous drainage: ?Low- quadrant drain management per IR that was placed on 10/29/2021, he is currently on IV Zosyn.  ?Please remain afebrile with no leukocytosis antibiotic per surgery. ?Tmax of 97.6. ?Keep NPO and TNA.  ?Midline draining, appear to be decreasing. ? ?Septic shock secondary to aspiration pneumonia and ongoing abdominal infection: ?Has been weaned off pressors ,  about 2 L +. ?KVO IV fluids. ?Vitals  are stable. ? ?Acute respiratory failure with hypoxia secondary to aspiration pneumonia: ?Extubated on  10/27/2021 due to altered mental status had to be reintubated on 11/07/2021. ?Now extubated on 11/26/2021, as continues breathing trial. ?Currently n.p.o. ,now wean to room air. ?Physical therapy is on board. Will need SNF. ? ?Acute encephalopathy multifactorial including but not limited to ICU delirium, infectious etiology, alcohol withdrawal: ?He has been off sedation, have only been using low-dose Ativan. ?Continue low-dose fentanyl for pain. ?Limit sedation. ? ?Acute kidney injury secondary to severe septic shock: ?Was on IV diuresis now positive about 2 L. ?Continue to monitor strict I's and O's. ?Peaked 1.9 now improved this morning 1.3. ?KVO IV fluids ? ?Electrolyte imbalance: ?Potassium 3.6, magnesium 1.8. ?Try to keep potassium greater than 4 magnesium greater than 2. ? ?Normocytic anemia: ?No evidence of bleeding ?Status post 20 of packed red blood cells as his hemoglobin was less than 7, this morning 8.3. ?Continue to monitor hemoglobin intermittently. ? ?Severe protein caloric malnutrition/cachexia: ?Currently on TPN. ?G-tube placed on 10/22/2021, currently to suction ?Speech on board to evaluate. ?Estimated body mass index is 17.11 kg/m? as calculated from the following: ?  Height as of this encounter: 5' 10" (1.778 m). ?  Weight as of this encounter: 54.1 kg. ? ?Right proximal humerus fracture: ?Status post fall in March 2023 seen by Ortho during that month with plan for nonweightbearing for 1 month then follow-up with them as an outpatient. ? ?Klebsiella bacteremia ?blood cultures on 10/23/2018 ?She was initially placed on vancomycin Flagyl and cefepime on 10/22/2021 ?De-escalated to IV Zosyn 10/24/2021, she has now completed 2-week course of IV antibiotics for bacteremia. ?Continue IV Zosyn per surgery for intra-abdominal infection ? ?Oropharyngeal dysphagia: ?Speech on board to evaluate. ? ?Hypernatremia//hyponatremia: ?Resolved with IV diuresis likely hypovolemic. ?Start d5w, b-met pending. ? ?Substance  induced mood disorder (HCC)/ Anxiety ? ?Goals of care/ethics: ?She remains a full code ?Palliative care has been consulted for complicated situation in regards to her surrogate decision-making ? ?Vertebral Pressure ulcer present ?RN Pressure Injury Documentation: ?Pressure Injury 10/27/21 Vertebral column Mid;Medial Deep Tissue Pressure Injury - Purple or maroon localized area of discolored intact skin or blood-filled blister due to damage of underlying soft tissue from pressure and/or shear. (Active)  ?10/27/21 0428  ?Location: Vertebral column  ?Location Orientation: Mid;Medial  ?Staging: Deep Tissue Pressure Injury - Purple or maroon localized area of discolored intact skin or blood-filled blister due to damage of underlying soft tissue from pressure and/or shear.  ?Wound Description (Comments):   ?Present on Admission: No  ?Dressing Type Foam - Lift dressing to assess site every shift 11/12/21 0400  ? ? ? ? ?DVT prophylaxis: lovenox ?Family Communication:none ?Status is: Inpatient ?Remains inpatient appropriate because: She has multiple medical problems including septic shock due to perforated bowels and aspiration ? ? ? ? ? ?Code Status:  ? ?  ?Code Status Orders  ?(From admission, onward)  ?  ? ? ?  ? ?  Start     Ordered  ? 10/22/21 1429  Full code  Continuous       ? 10/22/21 1431  ? ?  ?  ? ?  ? ?Code Status History   ? ? Date Active Date Inactive Code Status Order ID Comments User Context  ? 12/04/2017 2253 12/12/2017 1930 Full Code [id number]  Reyne Dumas, MD ED  ? ?  ? ? ? ? ?IV Access:  ? ?Peripheral IV ? ? ?Procedures and diagnostic studies:  ? ?No  results found. ? ? ?Medical Consultants:  ? ?None. ? ? ?Subjective:  ? ? ?Narya A Gitlin sleepy this morning no complaints. ? ?Objective:  ? ? ?Vitals:  ? 11/13/21 0300 11/13/21 0400 11/13/21 0500 11/13/21 0600  ?BP: (!) 162/77 (!) 160/86 (!) 163/69 (!) 127/92  ?Pulse: (!) 116 (!) 113 (!) 117 (!) 112  ?Resp: (!) 21 (!) 25 (!) 22 (!) 22  ?Temp:  97.7 ?F  (36.5 ?C)    ?TempSrc:  Oral    ?SpO2: 96% 97% 99% 100%  ?Weight:   53 kg   ?Height:      ? ?SpO2: 100 % ?O2 Flow Rate (L/min): 2 L/min ?FiO2 (%): 30 % ? ? ?Intake/Output Summary (Last 24 hours) at 11/13/2021

## 2021-11-13 NOTE — Plan of Care (Signed)
  Problem: Education: Goal: Knowledge of General Education information will improve Description: Including pain rating scale, medication(s)/side effects and non-pharmacologic comfort measures Outcome: Progressing   Problem: Health Behavior/Discharge Planning: Goal: Ability to manage health-related needs will improve Outcome: Progressing   Problem: Clinical Measurements: Goal: Ability to maintain clinical measurements within normal limits will improve Outcome: Progressing Goal: Will remain free from infection Outcome: Progressing Goal: Diagnostic test results will improve Outcome: Progressing Goal: Respiratory complications will improve Outcome: Progressing Goal: Cardiovascular complication will be avoided Outcome: Progressing   Problem: Activity: Goal: Risk for activity intolerance will decrease Outcome: Progressing   Problem: Nutrition: Goal: Adequate nutrition will be maintained Outcome: Progressing   Problem: Coping: Goal: Level of anxiety will decrease Outcome: Progressing   Problem: Elimination: Goal: Will not experience complications related to bowel motility Outcome: Progressing Goal: Will not experience complications related to urinary retention Outcome: Progressing   Problem: Pain Managment: Goal: General experience of comfort will improve Outcome: Progressing   Problem: Safety: Goal: Ability to remain free from injury will improve Outcome: Progressing   Problem: Skin Integrity: Goal: Risk for impaired skin integrity will decrease Outcome: Progressing   Problem: Fluid Volume: Goal: Hemodynamic stability will improve Outcome: Progressing   Problem: Clinical Measurements: Goal: Diagnostic test results will improve Outcome: Progressing Goal: Signs and symptoms of infection will decrease Outcome: Progressing   Problem: Respiratory: Goal: Ability to maintain adequate ventilation will improve Outcome: Progressing   Problem: Safety: Goal:  Non-violent Restraint(s) Outcome: Progressing   

## 2021-11-13 NOTE — Progress Notes (Signed)
? ? ?  Assessment & Plan: ?POD#22 - PERFORATED SIGMOID COLON - status post EX LAP, HARTMAN'S PROCEDURE, Gastrostomy tube placement - 3/81/8299 Dr. Leighton Ruff ?OR FINDINGS: Purulent ascites throughout the abdomen and stool contamination in the pelvis due to necrotic perforated sigmoid colon ?- Status post drainage of LLQ fluid collection by IR - 4/22, Cx with pseudomonas ?- WOCN following for colostomy care and pouching of fistula ?- Midline wound with colocutaneous fistula. Continue Eakins pouch.  ?- CT 5/3 also with pelvic fluid collection measuring 6 x 3.2 cm. Discussed with IR and not felt amenable/safe for drainage.  ?- Cont NPO, G-tube may be plugged, TPN, ABx ?  ?FEN - G-tube plugged, TPN ?VTE - SCDs, SQH ?ID - zosyn 4/17 - 4/27; 4/28 >> present ?  ?Discussed with nursing.  Continue present management.  Will plug gastrostomy today.  Continue to allow ice chips. ? ?      Armandina Gemma, MD ?[facility] ?A [facility] practice ?Office: [phone] ?       ?Chief Complaint: ?Perforated colon ? ?Subjective: ?Patient in bed, conversant. ? ?Objective: ?Vital signs in last 24 hours: ?Temp:  [97.7 ?F (36.5 ?C)-98.8 ?F (37.1 ?C)] 97.7 ?F (36.5 ?C) (05/07 0400) ?Pulse Rate:  [103-117] 113 (05/07 0700) ?Resp:  [14-25] 16 (05/07 0700) ?BP: (126-184)/(64-99) 138/73 (05/07 0700) ?SpO2:  [95 %-100 %] 99 % (05/07 0700) ?Weight:  [53 kg] 53 kg (05/07 0500) ?Last BM Date : 11/12/21 ? ?Intake/Output from previous day: ?05/06 0701 - 05/07 0700 ?In: 2284.6 [I.V.:2229.9; IV Piggyback:49.7] ?Out: 8101 [Urine:1200; Drains:325; Stool:5] ?Intake/Output this shift: ?No intake/output data recorded. ? ?Physical Exam: ?HEENT - sclerae clear, mucous membranes moist ?Neck - soft ?Abdomen - soft, non-distended; minimal in gastrostomy drainage bag; minimal in ostomy bag; feculent material in Eakin's pouch ?Ext - no edema, non-tender ? ?Lab Results:  ?Recent Labs  ?  11/11/21 ?1013  ?WBC 10.2  ?HGB 8.7*  ?HCT 26.5*  ?PLT 501*   ? ?BMET ?Recent Labs  ?  11/12/21 ?7510 11/13/21 ?2585  ?NA 137 136  ?K 4.3 4.8  ?CL 105 105  ?CO2 23 23  ?GLUCOSE 123* 112*  ?BUN 64* 62*  ?CREATININE 1.26* 1.19*  ?CALCIUM 8.8* 8.8*  ? ?PT/INR ?No results for input(s): LABPROT, INR in the last 72 hours. ?Comprehensive Metabolic Panel: ?   ?Component Value Date/Time  ? NA 136 11/13/2021 0608  ? NA 137 11/12/2021 0511  ? K 4.8 11/13/2021 0608  ? K 4.3 11/12/2021 0511  ? CL 105 11/13/2021 0608  ? CL 105 11/12/2021 0511  ? CO2 23 11/13/2021 0608  ? CO2 23 11/12/2021 0511  ? BUN 62 (H) 11/13/2021 2778  ? BUN 64 (H) 11/12/2021 0511  ? CREATININE 1.19 (H) 11/13/2021 2423  ? CREATININE 1.26 (H) 11/12/2021 0511  ? GLUCOSE 112 (H) 11/13/2021 5361  ? GLUCOSE 123 (H) 11/12/2021 0511  ? CALCIUM 8.8 (L) 11/13/2021 4431  ? CALCIUM 8.8 (L) 11/12/2021 0511  ? AST 18 11/10/2021 0250  ? AST 18 11/09/2021 0258  ? ALT 18 11/10/2021 0250  ? ALT 18 11/09/2021 0258  ? ALKPHOS 90 11/10/2021 0250  ? ALKPHOS 86 11/09/2021 0258  ? BILITOT 0.6 11/10/2021 0250  ? BILITOT 0.7 11/09/2021 0258  ? PROT 6.4 (L) 11/10/2021 0250  ? PROT 6.4 (L) 11/09/2021 0258  ? ALBUMIN 1.9 (L) 11/10/2021 0250  ? ALBUMIN 1.9 (L) 11/09/2021 0258  ? ? ?Studies/Results: ?No results found. ? ? ? ?Armandina Gemma ?11/13/2021 ? ?  ?

## 2021-11-13 NOTE — Progress Notes (Addendum)
PHARMACY - TOTAL PARENTERAL NUTRITION CONSULT NOTE  ? ?Indication: Prolonged ileus ? ?Patient Measurements: ?Height: '5\' 10"'$  (177.8 cm) ?Weight: 53 kg (116 lb 13.5 oz) ?IBW/kg (Calculated) : 68.5 ?TPN AdjBW (KG): 53.8 ?Body mass index is 16.77 kg/m?. ?Usual Weight: 53.8 kg ? ?Assessment: 61 yo F w/ PMH etoh use disorder, smoker, HTN, GERD.  S/p Exp Lap Hartmans 10/22/2021 for perforated sigmoid colon, Dr. Marcello Moores.   ? ?Glucose / Insulin: no hx DM.  CBGs continue to be <150.  No SSI (d/c 5/1) ?Electrolytes: Na 136 (removed Na 4/24), K up 4.8 (goal K >=4), Mag 2.2 (goal >=2), other lytes wnl ?Vitamins:  Vitamin C <0.1 (4/22), receiving 200 mg ascorbic acid daily in TPN from MVI ?Vitamin A 14.2 (4/22), receiving 1 mg of vitamin A daily in TPN from MVI ?Unable to add additional ascorbic acid and vitamin A to TPN per TPN policy ?5/5 - initiated ascorbic acid 500 mg per tube BID and vitamin A 10,000 units per tube daily ?Renal: SCr trending down to 1.26, BUN down 64 ?Hepatic: LFTs WNL; T bili WNL ?- 5/1: TG 108, 5/4 TG 141 ?Intake / Output;  UOP 1.8 L/ 24h, Drains total 375 mL, Stool 170 mL ?- maintenance fluids:  NS @ KVO ?Net I/O yesterday: +2008.2 mL ?GI Imaging:  ?4/15 CTA: bowel perforation ?4/17 KUB ileus or pSBO ?4/20 CTA: severe ileus or pSBO ? ?GI Surgeries / Procedures:  ?10/22/21 s/p EXPLORATORY LAPAROTOMY hartmans for perforated sigmoid colon ?4/23 IR placed drain LLQ ? ?Central access: CVC TL placed 10/23/21 ?TPN start date: 10/26/21 ? ?Nutritional Goals: ?Goal TPN rate is 90 mL/hr (provides 125 g of protein and ~ 2060 kcals per day)  ? ?RD Assessment: updated 5/2 as patient was intubated ?Estimated Needs ?Total Energy Estimated Needs: 2000-2200 kcal ?Total Protein Estimated Needs: 105-125 grams ?Total Fluid Estimated Needs: >/= 2.2 L/day ? ?5/3: Discussed with RD - now that patient was extubated this AM, ok to target previous nutrition goals as listed below: ?Total Energy Estimated Needs: 1900-2150 kcal ?Total  Protein Estimated Needs: 110-125 grams ?Total Fluid Estimated Needs: >/= 3 L/day ? ?Current Nutrition:  ?TPN @ 90 ml/hr  ?NPO except for ice chips ? ?Plan:  ?Continue TPN @  90 mL/hr to provide 100% of goals ?Electrolytes in TPN:  ?Na 0 mEq/L ?K 70 mEq/L (decreased) ?Ca 5 mEq/L ?Mg 8 mEq/L ?Phos 7 mmol/L ?Cl:Ac max Ac  ?Continue standard MVI and trace elements to TPN ?Continue folic acid & thiamine in TPN ?Monitor TPN labs on Mon/Thurs ? ?Royetta Asal, PharmD, BCPS ?Clinical Pharmacist ?[place] ?Please utilize Amion for appropriate phone number to reach the unit pharmacist ([place]) ?11/13/2021 9:13 AM ? ? ? ? ?

## 2021-11-14 LAB — COMPREHENSIVE METABOLIC PANEL
ALT: 22 U/L (ref 0–44)
AST: 27 U/L (ref 15–41)
Albumin: 2.2 g/dL — ABNORMAL LOW (ref 3.5–5.0)
Alkaline Phosphatase: 129 U/L — ABNORMAL HIGH (ref 38–126)
Anion gap: 10 (ref 5–15)
BUN: 64 mg/dL — ABNORMAL HIGH (ref 6–20)
CO2: 18 mmol/L — ABNORMAL LOW (ref 22–32)
Calcium: 8.8 mg/dL — ABNORMAL LOW (ref 8.9–10.3)
Chloride: 105 mmol/L (ref 98–111)
Creatinine, Ser: 1.19 mg/dL — ABNORMAL HIGH (ref 0.44–1.00)
GFR, Estimated: 52 mL/min — ABNORMAL LOW (ref >60)
Glucose, Bld: 123 mg/dL — ABNORMAL HIGH (ref 70–99)
Potassium: 5.3 mmol/L — ABNORMAL HIGH (ref 3.5–5.1)
Sodium: 133 mmol/L — ABNORMAL LOW (ref 135–145)
Total Bilirubin: 0.9 mg/dL (ref 0.3–1.2)
Total Protein: 7 g/dL (ref 6.5–8.1)

## 2021-11-14 LAB — PHOSPHORUS: Phosphorus: 4.7 mg/dL — ABNORMAL HIGH (ref 2.5–4.6)

## 2021-11-14 LAB — GLUCOSE, CAPILLARY
Glucose-Capillary: 120 mg/dL — ABNORMAL HIGH (ref 70–99)
Glucose-Capillary: 96 mg/dL (ref 70–99)
Glucose-Capillary: 106 mg/dL — ABNORMAL HIGH (ref 70–99)
Glucose-Capillary: 127 mg/dL — ABNORMAL HIGH (ref 70–99)
Glucose-Capillary: 141 mg/dL — ABNORMAL HIGH (ref 70–99)

## 2021-11-14 LAB — MAGNESIUM: Magnesium: 2.1 mg/dL (ref 1.7–2.4)

## 2021-11-14 LAB — TRIGLYCERIDES: Triglycerides: 142 mg/dL (ref <150)

## 2021-11-14 MED ORDER — PIPERACILLIN-TAZOBACTAM 3.375 G IVPB
3.3750 g | Freq: Three times a day (TID) | INTRAVENOUS | Status: DC
  Administered 2021-11-14 – 2021-12-06 (×67): 3.375 g via INTRAVENOUS
  Filled 2021-11-14 (×63): qty 50

## 2021-11-14 MED ORDER — ACETAMINOPHEN 160 MG/5ML PO SOLN
650.0000 mg | Freq: Once | ORAL | Status: AC | PRN
  Administered 2021-11-18: 650 mg
  Filled 2021-11-14: qty 20.3

## 2021-11-14 MED ORDER — ACETAMINOPHEN 160 MG/5ML PO SOLN: 650.0000 mg | Freq: Once | ORAL | Status: DC

## 2021-11-14 MED ORDER — TRAVASOL 10 % IV SOLN
INTRAVENOUS | Status: AC
  Filled 2021-11-14: qty 1252.8

## 2021-11-14 MED ORDER — SODIUM CHLORIDE 0.9 % IV SOLN: INTRAVENOUS | Status: DC

## 2021-11-14 MED ORDER — DEXTROSE-NACL 5-0.45 % IV SOLN: INTRAVENOUS | Status: AC

## 2021-11-14 NOTE — Progress Notes (Signed)
PT Cancellation Note ? ?Patient Details ?Name: Jessica [name] ?MRN: [id number] ?DOB: 1960-10-04 ? ? ?Cancelled Treatment:     Pt declined, no specific reason other than "today is not a good day".  Pt in bed with a female friend visiting.  Will attempt to see pt another day. ? ? ? ?Rica Koyanagi  PTA ?Acute  Rehabilitation Services ?Pager      [phone] ?Office      [phone] ? ?

## 2021-11-14 NOTE — Progress Notes (Signed)
? ?                                                                                                                                                     ?                                                   ?Daily Progress Note  ? ?Patient Name: Jessica [name]       Date: 11/14/2021 ?DOB: [birth year]  Age: 61 y.o. MRN#: [id number] ?Attending Physician: Charlynne Cousins, MD ?Primary Care Physician: Deland Pretty, MD ?Admit Date: 10/22/2021 ? ?Reason for Consultation/Follow-up: Establishing goals of care ? ?Subjective: ?I saw and examined Ms. Jessica [name].  At time of my encounter she was completing Medicaid application with assistance from staff.  Jessica [name] was also present at the bedside with her. ? ?She is much brighter today and tells me she is feeling better.  Speech is clear and she is appropriate in conversation. ? ?Jessica [name] then noted that they are also planning to work on healthcare power of attorney documents this afternoon.  I asked her about this and Jessica [name] told me again that she would like to name Jessica [name] as her Air traffic controller.  This is consistent with what she had told me multiple times through the weekend.  Spiritual care had also met with her alone this morning and she named Jessica [name] as well. ? ?Length of Stay: 23 ? ?Current Medications: ?Scheduled Meds:  ? vitamin C  500 mg Per Tube BID  ? chlorhexidine  15 mL Mouth Rinse BID  ? Chlorhexidine Gluconate Cloth  6 each Topical Daily  ? heparin injection (subcutaneous)  5,000 Units Subcutaneous Q8H  ? ipratropium-albuterol  3 mL Nebulization BID  ? mouth rinse  15 mL Mouth Rinse q12n4p  ? metoprolol tartrate  2.5 mg Intravenous Q12H  ? nicotine  14 mg Transdermal Daily  ? pantoprazole (PROTONIX) IV  40 mg Intravenous Q24H  ? sodium chloride flush  10-40 mL Intracatheter Q12H  ? sodium chloride flush  5 mL Intracatheter Q12H  ? vitamin A  10,000 Units Per Tube Daily  ? ? ?Continuous Infusions: ? sodium chloride Stopped (11/14/21 0834)  ? sodium chloride  100 mL/hr at 11/14/21 1100  ? dextrose 5 % and 0.45% NaCl 90 mL/hr at 11/14/21 1100  ? norepinephrine (LEVOPHED) Adult infusion Stopped (11/09/21 1123)  ? piperacillin-tazobactam (ZOSYN)  IV Stopped (11/14/21 1234)  ? TPN ADULT (ION)    ? ? ?PRN Meds: ?acetaminophen, antiseptic oral rinse, fentaNYL (SUBLIMAZE) injection, hydrALAZINE, lip balm, LORazepam, ondansetron (ZOFRAN) IV, sodium chloride flush ? ?Physical Exam         ?Appears chronically  ill and frail ?Awake and alert ?Has JP drain ?Has ostomy ?Appears with generalized weakness ? ?Vital Signs: BP (!) 176/88   Pulse 100   Temp 99.3 ?F (37.4 ?C) (Axillary)   Resp (!) 25   Ht '5\' 10"'  (1.778 m)   Wt 50.8 kg   SpO2 99%   BMI 16.07 kg/m?  ?SpO2: SpO2: 99 % ?O2 Device: O2 Device: Room Air ?O2 Flow Rate: O2 Flow Rate (L/min): 2 L/min ? ?Intake/output summary:  ?Intake/Output Summary (Last 24 hours) at 11/14/2021 1415 ?Last data filed at 11/14/2021 1100 ?Gross per 24 hour  ?Intake 2347.13 ml  ?Output 850 ml  ?Net 1497.13 ml  ? ? ?LBM: Last BM Date : 11/12/21 ?Baseline Weight: Weight: 54 kg ?Most recent weight: Weight: 50.8 kg ? ?     ?Palliative Assessment/Data: ? ? ? ? ? ?Patient Active Problem List  ? Diagnosis Date Noted  ? Severe sepsis due to perforated sigmoid colon s/p Hartmann/colostomy 10/22/2021 10/22/2021  ? Acute respiratory failure with hypoxia ([place]) 11/03/2021  ? Acute metabolic encephalopathy 51/08/5850  ? Oropharyngeal dysphagia 11/03/2021  ? Sinus tachycardia 11/05/2021  ? Normocytic anemia 11/04/2021  ? Hypernatremia, hyponatremia, hypokalemia, hyperphosphatemia 11/03/2021  ? Elevated brain natriuretic peptide (BNP) level 11/03/2021  ? Right proximal humeral fracture 11/03/2021  ? AKI (acute kidney injury) ([place]) 10/25/2021  ? Chronic pain 10/24/2021  ? Tobacco use disorder 10/24/2021  ? Uncontrolled hypertension 10/24/2021  ? Protein-calorie malnutrition, severe ([place]) 10/24/2021  ? Septic shock ([place]) 11/09/2021  ? Postoperative intra-abdominal  abscess 11/09/2021  ? 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 Malnutrition of moderate degree 11/07/2021  ? Palliative care by specialist   ? Goals of care, counseling/discussion   ? General weakness   ? Hypokalemia 11/03/2021  ? Hyperphosphatemia 11/03/2021  ? Hyponatremia 11/03/2021  ? Colostomy in place [facility]) 10/30/2021  ? Stricture and stenosis of esophagus 10/30/2021  ? Gastrostomy tube in place [facility]) 10/30/2021  ? Anxiety 10/24/2021  ? Allergic rhinitis 10/24/2021  ? 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 Underweight 10/24/2021  ? Acquired hammer toe of right foot 10/24/2021  ? Urticaria 10/24/2021  ? Dry mouth 10/24/2021  ? Substance induced mood disorder ([place])   ? ? ?Palliative Care Assessment & Plan  ? ?Patient Profile: ?  ? ?Assessment: ?61 year old lady with history of alcohol use disorder, tobacco use, recent right proximal humeral fracture, presented to the hospital at this time with diffuse abdominal pain.  Was found to have sepsis sigmoid perforation, was placed on antibiotics and underwent ex lap with Hartman's procedure and G-tube placement on 10-22-2021.  Postop course complicated by patient requiring reintubation and vasopressors on 4-17 and then she was extubated on 4-20.  Periodically has required Precedex due to agitation and hallucinations.  Found to have postop abdominal abscess requiring drainage on 4-22.  Started on TPN due to tube feed intolerance.  Weaned off of vasopressors as well as Precedex.  Hospital course complicated by feculent output from laparotomy site and repeat CT abdomen on 4-28 with new fluid collection within the cul-de-sac.  Remains on IV antibiotics and TPN.  Interventional radiology and general surgery are following.  Palliative medicine team consulted for ongoing goals of care discussions.  At the time of initial consultation patient had endorsed full code/full scope.  She had stated that she had survived her colon bursting inside of her and hence wished to continue with any and  all means to get better.  Hospital course complicated by patient having new fluid collection in  her abdomen on repeat imaging as well as ongoing weakness. ? ?Recommendations/Plan: ?Continue current mode of care. ?She states that she would like to name her friend, Jessica [name], as her healthcare power of attorney.  Spiritual care is following and assisting her with this.  I appreciate this help greatly. ? ?Goals of Care and Additional Recommendations: ?Limitations on Scope of Treatment: Full Scope Treatment ? ?Code Status: ? ?  ?Code Status Orders  ?(From admission, onward)  ?  ? ? ?  ? ?  Start     Ordered  ? 10/22/21 1429  Full code  Continuous       ? 10/22/21 1431  ? ?  ?  ? ?  ? ?Code Status History   ? ? Date Active Date Inactive Code Status Order ID Comments User Context  ? 12/04/2017 2253 12/12/2017 1930 Full Code [id number]  Reyne Dumas, MD ED  ? ?  ? ? ?Prognosis: ? Guarded  ? ?Discharge Planning: ?To Be Determined based on hospital course.  ? ?Care plan was discussed with RN. ? ?Thank you for allowing the Palliative Medicine Team to assist in the care of this patient. ? ?Micheline Rough, MD ? ?Please contact Palliative Medicine Team phone at [phone] for questions and concerns.  ? ? ? ? ? ?

## 2021-11-14 NOTE — Progress Notes (Signed)
Chaplain checked back in to offer follow-up on Advanced Directive and Jessica [name] was sleeping and visitor, Jeneen Rinks, was not present.  ? ?Chaplain will follow-up tomorrow and work to get paperwork completed if Jessica [name] is up to doing so.  ? ? ? 11/14/21 1400  ?Clinical Encounter Type  ?Visited With Patient not available  ?Visit Type Follow-up  ? ? ?

## 2021-11-14 NOTE — Progress Notes (Signed)
PHARMACY - TOTAL PARENTERAL NUTRITION CONSULT NOTE  ? ?Indication: Prolonged ileus ? ?Patient Measurements: ?Height: '5\' 10"'$  (177.8 cm) ?Weight: 50.8 kg (111 lb 15.9 oz) ?IBW/kg (Calculated) : 68.5 ?TPN AdjBW (KG): 53.8 ?Body mass index is 16.07 kg/m?. ?Usual Weight: 53.8 kg ? ?Assessment: 61 yo F w/ PMH etoh use disorder, smoker, HTN, GERD.  S/p Exp Lap Hartmans 10/22/2021 for perforated sigmoid colon, Dr. Marcello Moores.   ? ?Glucose / Insulin: no hx DM.  CBGs continue to be <150.  (insulin d/c 5/1) ?Electrolytes: Na down to 133 (removed 4/24, resume Na 5/8), K up 5.3 (goal K >=4), Mag 2.1 (goal >=2), Phos up 4.7, C02 low, other lytes wnl ?Vitamins:  Vitamin C <0.1 (4/22), receiving 200 mg ascorbic acid daily in TPN from MVI ?Vitamin A 14.2 (4/22), receiving 1 mg of vitamin A daily in TPN from [facility] ?Unable to add additional ascorbic acid and vitamin A to TPN per TPN policy ?5/5 - initiated ascorbic acid 500 mg per tube BID and vitamin A 10,000 units per tube daily.  tube to be clamped x1 hour after administration then returned to suction.  ?Renal: SCr 1.19, BUN down 64 ?Hepatic: LFTs, Tbili WNL ?- 5/1: TG 108, 141 ?Intake / Output;  net I/O +910 mL ?- UOP 1.3 L, Drains total down to 240 mL, Stool 0 mL ?- maintenance fluids:  NS @ 100 ml/hr ? ?GI Imaging:  ?4/15 CTA: bowel perforation ?4/17 KUB ileus or pSBO ?4/20 CTA: severe ileus or pSBO ? ?GI Surgeries / Procedures:  ?10/22/21 s/p EXPLORATORY LAPAROTOMY hartmans for perforated sigmoid colon ?4/23 IR placed drain LLQ ? ?Central access: CVC TL placed 10/23/21 ?TPN start date: 10/26/21 ? ?Nutritional Goals: ?Goal TPN rate is 90 mL/hr (provides 125 g of protein and ~ 2060 kcals per day)  ? ?RD Assessment: (5/5) ?Estimated Needs ?Total Energy Estimated Needs: 2000-2200 kcal ?Total Protein Estimated Needs: 105-125 grams ?Total Fluid Estimated Needs: >/= 2.2 L/day ? ?Current Nutrition:  ?TPN @ 90 ml/hr  ?NPO except for ice chips ? ?Plan:  ?Discontinue current bag of TPN  (hyperkalemia) and start D5 1/2 NS @ 90 ml/hr until 18:00 ? ?Tonight at 18:00, restart TPN @  90 mL/hr to provide 100% of goals ?Electrolytes in TPN:  ?Na 30 mEq/L ?K 0 mEq/L ?Ca 5 mEq/L ?Mg 8 mEq/L ?Phos 0 mmol/L ?Cl:Ac max Ac  ?Continue standard MVI and trace elements to TPN ?Continue folic acid & thiamine in TPN ?Monitor TPN labs on Mon/Thurs ? ? ?Gretta Arab PharmD, BCPS ?Clinical Pharmacist ?[facility] [phone] ?11/14/2021 9:11 AM ? ? ? ? ? ?

## 2021-11-14 NOTE — Progress Notes (Signed)
Chaplain engaged in an initial visit with Jessica [name].  Chaplain was able to get Jessica [name] some ice chips and a warm blanket as she expressed that she was cold.  Chaplain was also able to explain to Jessica [name] about why she was consulted concerning the Advanced Directive, Healthcare POA.  When Chaplain voiced this, Jessica [name] brought up her friend Jessica [name].  She desires for him to be present for conversation.  Chaplain let team to know to have her paged when Jessica [name] or other friend arrives to go over paperwork.  ? ?Jessica [name] showed worry over the process taking too long and Chaplain let her know she could get it done as soon as possible. Chaplain offered support, presence, and education.  ? ? ? 11/14/21 1100  ?Clinical Encounter Type  ?Visited With Patient  ?Visit Type Initial;Social support  ?Referral From Palliative care team  ?Consult/Referral To Chaplain  ? ? ?

## 2021-11-14 NOTE — Progress Notes (Signed)
Pharmacy Antibiotic Note ? ?Jessica [name] is a 61 y.o. female admitted on 10/22/2021 with Intra-abdominal infection.  Zosyn course completed on 4/27, and on 5/5, but now has fever again.  Pharmacy has been re-consulted for Zosyn dosing. ? ?Plan: ?Zosyn 3.375g IV Q8H infused over 4hrs.  ?Pharmacy will sign off notes, but follow up peripherally for renal function, culture results, and clinical course. ? ? ?Height: '5\' 10"'$  (177.8 cm) ?Weight: 50.8 kg (111 lb 15.9 oz) ?IBW/kg (Calculated) : 68.5 ? ?Temp (24hrs), Avg:100.2 ?F (37.9 ?C), Min:99.3 ?F (37.4 ?C), Max:100.9 ?F (38.3 ?C) ? ?Recent Labs  ?Lab 11/07/21 ?1832 11/07/21 ?1919 11/07/21 ?2125 11/08/21 ?2297 11/09/21 ?9892 11/10/21 ?1194 11/11/21 ?1740 11/11/21 ?1013 11/12/21 ?8144 11/13/21 ?8185 11/14/21 ?0430  ?WBC 13.5*  --   --  14.5* 13.0*  --   --  10.2  --   --   --   ?CREATININE  --    < >  --  1.55* 1.73* 1.37* 1.34*  --  1.26* 1.19* 1.19*  ?LATICACIDVEN 0.7  --  0.9  --   --   --   --   --   --   --   --   ? < > = values in this interval not displayed.  ?  ?Estimated Creatinine Clearance: 40.3 mL/min (A) (by C-G formula based on SCr of 1.19 mg/dL (H)).   ? ?No Known Allergies ? ?Antimicrobials this admission: ?4/15 Vanc>>4/16 ?4/15 Flagyl >>4/17 ?4/15 cefepime >>4/17 ?4/17 CTX >> 4/17 ?4/17 ZEI>> 4/27,  4/28 >> 5/5, resumed 5/8 >>  ? ?Dose adjustments this admission: ? ? ?Microbiology results: ?4/15 BCx: GNR (BCID + Klebsiella oxytoca) R amp. Sens to all others F ?4/16 MRSA PCR: negative ?4/16 HIV NR ?4/22 fluid from IR abdomen peritoneal drain: gram stain neg, cx GNR - pseudomonas - pan-sensitive ?  ? ?Thank you for allowing pharmacy to be a part of this patient?s care. ? ?Gretta Arab PharmD, BCPS ?Clinical Pharmacist ?[facility] [phone] ?11/14/2021 7:38 AM ? ? ?

## 2021-11-14 NOTE — Progress Notes (Signed)
[facility] ?PROGRESS NOTE ? ? ? ?Progress Note  ?Jessica [name]  BJS:[id number] DOB: [birth year] DOA: 10/22/2021 ?PCP: Deland Pretty, MD  ? ? ? ?Brief Narrative:  ? ?Jessica [name] is an 61 y.o. female past medical history significant for alcohol abuse, with admissions for severe alcohol withdrawals requiring ICU admissions tobacco use presented to the ED on 10/22/2021 diffuse abdominal pain that started the day prior to admission.  CT of the abdomen pelvis showed pneumoperitoneum and free fluid, severe sepsis with started on broad-spectrum antibiotics fluid resuscitated surgery was consulted was taken emergently to the OR underwent exploratory laparotomy and was found to have a perforated sigmoid colon, status post Hartmann procedure and insertion of the G-tube which was left to drain to gravity, JP in pelvis was noted to have feculent peritonitis in the pelvis.  ICU course was complicated by Klebsiella bacteremia profound caloric protein malnutrition requiring TPN, acute respiratory failure with hypoxia and tachycardia with failure to progress ? ? ?Significant Events: ?10/22/21 ex lap, hartman's procedure, g tube placement. Extubated in OR ?4/17 Reintubated overnight with continued pressor support x2. 11 liters positive thus far with progressive AKI this am  ?4/18 Continued issues with electrolyte abnormalities overnight, remains on levo and neo. She is now 14L positive but urine output has increased  ?4/19 No issues overnight, remains 14L positive but urine output continues to improve. Start to slowly diureses today  ?4/20 weaning NE, extubated, diuresed, started trickle TF, remains on precedex for agitation  ?4/21 periods of agitation/ hallucinations, scheduled ativan helps, remains on precedex 0.7. CT A/P with suspicious for partial small bowel obstruction, with transition point in the lower central pelvis. Increased small to moderate amount of ascites, without definite focal abscess collection. Small amount  of postop free air also noted. New small left pleural effusion and left lower lobe atelectasis. New diffuse body wall edema, consistent with anasarca. Cholelithiasis.  ?4/22 On  TPN.  Did not tolerated TF yesterday -vomitted and now G tube to LIS with bilios returns.  Low grade fever +. WBC now normal. On Zosyn. Hgb < 7g% without overt bleeding - 1 U PRBC. delirium worse.  ?4/24 Remains off vasopressors, overnight with agitation requiring precedex, 5 mg Haldol, 2 mg versed ?4/25 Out of bed. SLP eval declined by patient as well as OT.  ?4/26 Off precedex. Seen by palliative care > pt requested full code. No family.  ?4/27 Transferred to [facility]  ?5/1 PCCM called back for AMS, intubated for declining mental status and hypoxia. Back in Septic shock, working dx on-going abd sepsis and new aspiration PNA  ?5/2 Followed by IR and surgery both concerned about on-going intrabd infection. 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 Both teams wanting contrasted film but renal fxn barrier. ?5/3 got blood. Passed SBT->extubated  ?5/4 ct abd and pelvis  CT scan of the abdomen pelvis showed ventralcollection and midline wound, drainage fistula which catheter resides within this collection.  ? ?Assessment/Plan:  ? ?Severe septic shock due to perforated sigmoid colon s/p Hartmann/colostomy 10/22/2021/with G-tube placement 10/21/2021/.  Complicated by colocutaneous fistula with Pseudomonas intra-abdominal leak requiring percutaneous drainage: ?Low- quadrant drain management per IR that was placed on 10/29/2021. ?Please remain afebrile with no leukocytosis antibiotic per surgery. ?Tmax of 97.6. ?CT on 10/10/2021 showed pelvic fluid collection amenable to drainage. ?Keep the patient n.p.o. continue NG tube and TNA and antibiotics per surgery. ?She is now spiking fever, last dose of antibiotics on 11/11/2021. ?We will discuss with surgery to resume antibiotics. ?Check blood  cultures x2 ? ?Septic shock secondary to aspiration pneumonia and ongoing  abdominal infection: ?Has been weaned off pressors ,  about 2 L +. ?KVO IV fluids. ?Vitals are stable. ? ?Acute respiratory failure with hypoxia secondary to aspiration pneumonia: ?Extubated on 10/27/2021 due to altered mental status had to be reintubated on 11/07/2021. ?Now extubated on 11/26/2021. ?Currently n.p.o. ,now wean to room air. ?Physical therapy is on board. Will need SNF vs LTAC. ? ?Acute encephalopathy multifactorial including but not limited to ICU delirium, infectious etiology, alcohol withdrawal: ?He has been off sedation, have only been using low-dose Ativan. ?Continue low-dose fentanyl for pain. ?Limit sedation. ? ?Acute kidney injury secondary to severe septic shock: ?Was on IV diuresis now positive about 2 L. ?Continue to monitor strict I's and O's. ?Peaked 1.9 now improved this morning 1.1. ?Resume IV fluids she is becoming hyperkalemic. ? ?Electrolyte imbalance: ?Potassium 5.3 magnesium 1.8. ?Try to keep potassium greater than 4 magnesium greater than 2. ? ?Normocytic anemia: ?No evidence of bleeding ?Status post 20 of packed red blood cells as his hemoglobin was less than 7, this morning 8.3. ?Continue to monitor hemoglobin intermittently. ? ?Severe protein caloric malnutrition/cachexia: ?Currently on TPN. ?G-tube placed on 10/22/2021, currently to suction ?Speech on board to evaluate. ?Estimated body mass index is 17.11 kg/m? as calculated from the following: ?  Height as of this encounter: '5\' 10"'  (1.778 m). ?  Weight as of this encounter: 54.1 kg. ? ?Right proximal humerus fracture: ?Status post fall in March 2023 seen by Ortho during that month with plan for nonweightbearing for 1 month then follow-up with them as an outpatient. ? ?Klebsiella bacteremia ?blood cultures on 10/23/2018 ?She was initially placed on vancomycin Flagyl and cefepime on 10/22/2021 ?De-escalated to IV Zosyn 10/24/2021, she has now completed 2-week course of IV antibiotics for bacteremia. ?Continue IV Zosyn per surgery for  intra-abdominal infection ? ?Oropharyngeal dysphagia: ?Speech on board to evaluate. ? ?Hypernatremia//hyponatremia: ?Resolved with IV diuresis likely hypovolemic. ?Start d5w, b-met pending. ? ?Mild hyperkalemia: ?The patient is currently n.p.o. we will start IV fluids recheck a basic metabolic panel in the morning cannot use Kayexalate due to her recent abdominal surgery. ?Will have to reduce potassium in TNA solution. ? ?Substance induced mood disorder (HCC)/ Anxiety ? ?Goals of care/ethics: ?She remains a full code ?Palliative care has been consulted for complicated situation in regards to her surrogate decision-making ? ?Vertebral Pressure ulcer present ?RN Pressure Injury Documentation: ?Pressure Injury 10/27/21 Vertebral column Mid;Medial Deep Tissue Pressure Injury - Purple or maroon localized area of discolored intact skin or blood-filled blister due to damage of underlying soft tissue from pressure and/or shear. (Active)  ?10/27/21 0428  ?Location: Vertebral column  ?Location Orientation: Mid;Medial  ?Staging: Deep Tissue Pressure Injury - Purple or maroon localized area of discolored intact skin or blood-filled blister due to damage of underlying soft tissue from pressure and/or shear.  ?Wound Description (Comments):   ?Present on Admission: No  ?Dressing Type Foam - Lift dressing to assess site every shift 11/13/21 2000  ? ? ? ? ?DVT prophylaxis: lovenox ?Family Communication:none ?Status is: Inpatient ?Remains inpatient appropriate because: She has multiple medical problems including septic shock due to perforated bowels and aspiration ? ? ? ? ? ?Code Status:  ? ?  ?Code Status Orders  ?(From admission, onward)  ?  ? ? ?  ? ?  Start     Ordered  ? 10/22/21 1429  Full code  Continuous       ?  10/22/21 1431  ? ?  ?  ? ?  ? ?Code Status History   ? ? Date Active Date Inactive Code Status Order ID Comments User Context  ? 12/04/2017 2253 12/12/2017 1930 Full Code [id number]  Reyne Dumas, MD ED  ? ?   ? ? ? ? ?IV Access:  ? ?Peripheral IV ? ? ?Procedures and diagnostic studies:  ? ?No results found. ? ? ?Medical Consultants:  ? ?None. ? ? ?Subjective:  ? ? ?Jessica [name] no complaints this morning relate

## 2021-11-14 NOTE — Progress Notes (Signed)
? ? ?Assessment & Plan: ?POD#23 - PERFORATED SIGMOID COLON - status post EX LAP, HARTMAN'S PROCEDURE, Gastrostomy tube placement - 7/67/2094 Dr. Leighton Ruff ?OR FINDINGS: Purulent ascites throughout the abdomen and stool contamination in the pelvis due to necrotic perforated sigmoid colon ?- Status post drainage of LLQ fluid collection by IR - 4/22, Cx with pseudomonas ?- Midline wound with colocutaneous fistula. Continue Eakins pouch. WOCN following for colostomy and pouching of fistula ?- CT 5/4 also with pelvic fluid collection measuring 6 x 3.2 cm. Discussed with IR and not felt amenable/safe for drainage. IV abx. ?-  G-tube clamped 5/7. Patient tolerating. Allow sips from floor, TPN ?  ?FEN - G-tube capped, TPN, sips/chips ?VTE - SCDs, SQH ?ID - zosyn 4/17 - 4/27; 4/28-5/6, 5/8 >> ?  ?Resume IV abx. so far tolerated clamped G tube. Allow sips from floor today. Monitor temps and WBC. May need repeat CT this week for persistent fever, leukocytosis. ? ?      Obie Dredge, PA-C ?[facility] ?A [facility] practice ?Office: [phone] ?       ?Chief Complaint: ?Perforated colon ? ?Subjective: ?Patient in bed, resting. Refuses to let me examine her - asks that I come back later. Tolerated G-tube clamped for the last 24h. No n/v ? ?HR 111 bpm ?TMAX 100.9 ? ?Objective: ?Vital signs in last 24 hours: ?Temp:  [99.3 ?F (37.4 ?C)-100.9 ?F (38.3 ?C)] 100.3 ?F (37.9 ?C) (05/08 0815) ?Pulse Rate:  [100-116] 110 (05/08 0600) ?Resp:  [15-24] 17 (05/08 0600) ?BP: (138-178)/(65-88) 155/85 (05/08 0600) ?SpO2:  [93 %-100 %] 99 % (05/08 0815) ?Weight:  [50.8 kg] 50.8 kg (05/08 0304) ?Last BM Date : 11/12/21 ? ?Intake/Output from previous day: ?05/07 0701 - 05/08 0700 ?In: 2287 [I.V.:2182] ?Out: 9476 [Urine:1300; Drains:90] ?Intake/Output this shift: ?No intake/output data recorded. ? ?Physical Exam: ?HEENT - sclerae clear, mucous membranes moist ?Neck - soft ?Abdomen - soft, patient refused exam. ?Ext - no  edema, non-tender ? ?Lab Results:  ?Recent Labs  ?  11/11/21 ?1013  ?WBC 10.2  ?HGB 8.7*  ?HCT 26.5*  ?PLT 501*  ? ?BMET ?Recent Labs  ?  11/13/21 ?0608 11/14/21 ?0430  ?NA 136 133*  ?K 4.8 5.3*  ?CL 105 105  ?CO2 23 18*  ?GLUCOSE 112* 123*  ?BUN 62* 64*  ?CREATININE 1.19* 1.19*  ?CALCIUM 8.8* 8.8*  ? ?PT/INR ?No results for input(s): LABPROT, INR in the last 72 hours. ?Comprehensive Metabolic Panel: ?   ?Component Value Date/Time  ? NA 133 (L) 11/14/2021 0430  ? NA 136 11/13/2021 0608  ? K 5.3 (H) 11/14/2021 0430  ? K 4.8 11/13/2021 0608  ? CL 105 11/14/2021 0430  ? CL 105 11/13/2021 0608  ? CO2 18 (L) 11/14/2021 0430  ? CO2 23 11/13/2021 0608  ? BUN 64 (H) 11/14/2021 0430  ? BUN 62 (H) 11/13/2021 5465  ? CREATININE 1.19 (H) 11/14/2021 0430  ? CREATININE 1.19 (H) 11/13/2021 0354  ? GLUCOSE 123 (H) 11/14/2021 0430  ? GLUCOSE 112 (H) 11/13/2021 6568  ? CALCIUM 8.8 (L) 11/14/2021 0430  ? CALCIUM 8.8 (L) 11/13/2021 1275  ? AST 27 11/14/2021 0430  ? AST 18 11/10/2021 0250  ? ALT 22 11/14/2021 0430  ? ALT 18 11/10/2021 0250  ? ALKPHOS 129 (H) 11/14/2021 0430  ? ALKPHOS 90 11/10/2021 0250  ? BILITOT 0.9 11/14/2021 0430  ? BILITOT 0.6 11/10/2021 0250  ? PROT 7.0 11/14/2021 0430  ? PROT 6.4 (L) 11/10/2021 0250  ? ALBUMIN  2.2 (L) 11/14/2021 0430  ? ALBUMIN 1.9 (L) 11/10/2021 0250  ? ? ?Studies/Results: ?No results found. ? ? ? ?[place] ?11/14/2021 ? ?  ?

## 2021-11-15 ENCOUNTER — Encounter (HOSPITAL_COMMUNITY): Payer: Self-pay

## 2021-11-15 DIAGNOSIS — R1084 Generalized abdominal pain: Secondary | ICD-10-CM

## 2021-11-15 LAB — GLUCOSE, CAPILLARY
Glucose-Capillary: 121 mg/dL — ABNORMAL HIGH (ref 70–99)
Glucose-Capillary: 121 mg/dL — ABNORMAL HIGH (ref 70–99)
Glucose-Capillary: 123 mg/dL — ABNORMAL HIGH (ref 70–99)
Glucose-Capillary: 132 mg/dL — ABNORMAL HIGH (ref 70–99)
Glucose-Capillary: 136 mg/dL — ABNORMAL HIGH (ref 70–99)
Glucose-Capillary: 141 mg/dL — ABNORMAL HIGH (ref 70–99)

## 2021-11-15 LAB — BASIC METABOLIC PANEL
Anion gap: 9 (ref 5–15)
BUN: 56 mg/dL — ABNORMAL HIGH (ref 6–20)
CO2: 20 mmol/L — ABNORMAL LOW (ref 22–32)
Calcium: 8.3 mg/dL — ABNORMAL LOW (ref 8.9–10.3)
Chloride: 104 mmol/L (ref 98–111)
Creatinine, Ser: 1.14 mg/dL — ABNORMAL HIGH (ref 0.44–1.00)
GFR, Estimated: 55 mL/min — ABNORMAL LOW (ref >60)
Glucose, Bld: 126 mg/dL — ABNORMAL HIGH (ref 70–99)
Potassium: 3.3 mmol/L — ABNORMAL LOW (ref 3.5–5.1)
Sodium: 133 mmol/L — ABNORMAL LOW (ref 135–145)

## 2021-11-15 LAB — MAGNESIUM: Magnesium: 1.8 mg/dL (ref 1.7–2.4)

## 2021-11-15 LAB — PHOSPHORUS: Phosphorus: 4.1 mg/dL (ref 2.5–4.6)

## 2021-11-15 MED ORDER — TRAVASOL 10 % IV SOLN
INTRAVENOUS | Status: AC
  Filled 2021-11-15: qty 1252.8

## 2021-11-15 MED ORDER — POTASSIUM CHLORIDE 10 MEQ/50ML IV SOLN
10.0000 meq | INTRAVENOUS | Status: AC
  Administered 2021-11-15 (×5): 10 meq via INTRAVENOUS
  Filled 2021-11-15 (×5): qty 50

## 2021-11-15 NOTE — Progress Notes (Signed)
PHARMACY - TOTAL PARENTERAL NUTRITION CONSULT NOTE  ? ?Indication: Prolonged ileus ? ?Patient Measurements: ?Height: '5\' 10"'$  (177.8 cm) ?Weight: 53.2 kg (117 lb 4.6 oz) ?IBW/kg (Calculated) : 68.5 ?TPN AdjBW (KG): 53.8 ?Body mass index is 16.83 kg/m?. ?Usual Weight: 53.8 kg ? ?Assessment: 61 yo F w/ PMH etoh use disorder, smoker, HTN, GERD.  S/p Exp Lap Hartmans 10/22/2021 for perforated sigmoid colon, Dr. Marcello Moores.   ? ?Glucose / Insulin: no hx DM.  CBGs continue to be <150.  (insulin d/c 5/1) ?Electrolytes: Na remains low at 133 (removed 4/24, resumed Na in TPN 5/8), K down to 3.3 (hyperK 5/8, removed K from TPN on 5/8, goal K >=4), C02 low/improved.  Others WNL including Mag 1.8 (goal >=2), Phos 4.1 ?Vitamins:  Vitamin C <0.1 (4/22), receiving 200 mg ascorbic acid daily in TPN from MVI ?Vitamin A 14.2 (4/22), receiving 1 mg of vitamin A daily in TPN from MVI ?Unable to add additional ascorbic acid and vitamin A to TPN per TPN policy ?5/5 - initiated ascorbic acid 500 mg per tube BID and vitamin A 10,000 units per tube daily.  tube to be clamped x1 hour after administration then returned to suction.  ?Renal: SCr down to 1.14, BUN down 56 ?Hepatic: LFTs, Tbili WNL ?- TG 108, 141, 142 ?Intake / Output;  net I/O +1738 mL  ?- UOP down to 900 mL, Drains total down to 60 mL, Stool 50 mL ?- maintenance fluids off ? ?GI Imaging:  ?4/15 CTA: bowel perforation ?4/17 KUB ileus or pSBO ?4/20 CTA: severe ileus or pSBO ? ?GI Surgeries / Procedures:  ?10/22/21 s/p EXPLORATORY LAPAROTOMY hartmans for perforated sigmoid colon ?4/23 IR placed drain LLQ ? ?Central access: CVC TL placed 10/23/21 ?TPN start date: 10/26/21 ? ?Nutritional Goals: ?Goal TPN rate is 90 mL/hr (provides 125 g of protein and ~ 2060 kcals per day)  ? ?RD Assessment: (5/5) ?Estimated Needs ?Total Energy Estimated Needs: 2000-2200 kcal ?Total Protein Estimated Needs: 105-125 grams ?Total Fluid Estimated Needs: >/= 2.2 L/day ? ?Current Nutrition:  ?TPN @ 90 ml/hr  ?NPO  except for ice chips ?G-tube clamped 5/7. Tolerating ice chips/sips. SLP eval and advance to a liquid diet. ? ?Plan:  ?Now:  KCl 10 mEq IV x5 runs ?At 18:00:  Continue TPN @  90 mL/hr to provide 100% of goals ?Electrolytes in TPN:  ?Na 50 mEq/L ?K 30 mEq/L ?Ca 5 mEq/L ?Mg 8 mEq/L ?Phos 3 mmol/L ?Cl:Ac max Ac  ?Continue standard MVI and trace elements to TPN ?Continue folic acid & thiamine in TPN ?Monitor TPN labs on Mon/Thurs ?Follow up enteral intake.   ? ? ?Gretta Arab PharmD, BCPS ?Clinical Pharmacist ?[facility] [phone] ?11/15/2021 7:35 AM ? ? ? ? ? ?

## 2021-11-15 NOTE — Progress Notes (Signed)
? ?                                                                                                                                                     ?                                                   ?Daily Progress Note  ? ?Patient Name: Jessica [name]       Date: 11/15/2021 ?DOB: 22-Jul-1960  Age: 61 y.o. MRN#: [id number] ?Attending Physician: Charlynne Cousins, MD ?Primary Care Physician: Deland Pretty, MD ?Admit Date: 10/22/2021 ? ?Reason for Consultation/Follow-up: Establishing goals of care ? ?Subjective: ?Patient is awake and alert, she is resting in a chair, she states that she has a lot of weakness and feels like she is not able to remember anything these days. She asks for chicken broth, I told her that SLP evaluation is pending, she denies pain, she did complete HCPOA documents and has designated her boyfriend james [place] as her HCPOA agent.  ?  ? ?Length of Stay: 24 ? ?Current Medications: ?Scheduled Meds:  ? vitamin C  500 mg Per Tube BID  ? chlorhexidine  15 mL Mouth Rinse BID  ? Chlorhexidine Gluconate Cloth  6 each Topical Daily  ? heparin injection (subcutaneous)  5,000 Units Subcutaneous Q8H  ? ipratropium-albuterol  3 mL Nebulization BID  ? mouth rinse  15 mL Mouth Rinse q12n4p  ? metoprolol tartrate  2.5 mg Intravenous Q12H  ? nicotine  14 mg Transdermal Daily  ? pantoprazole (PROTONIX) IV  40 mg Intravenous Q24H  ? sodium chloride flush  10-40 mL Intracatheter Q12H  ? sodium chloride flush  5 mL Intracatheter Q12H  ? vitamin A  10,000 Units Per Tube Daily  ? ? ?Continuous Infusions: ? sodium chloride 10 mL/hr at 11/14/21 1900  ? norepinephrine (LEVOPHED) Adult infusion Stopped (11/09/21 1123)  ? piperacillin-tazobactam (ZOSYN)  IV Stopped (11/15/21 1212)  ? potassium chloride 50 mL/hr at 11/15/21 1417  ? TPN ADULT (ION) 90 mL/hr at 11/14/21 1805  ? TPN ADULT (ION)    ? ? ?PRN Meds: ?acetaminophen (TYLENOL) oral liquid 160 mg/5 mL, acetaminophen, antiseptic oral rinse, fentaNYL (SUBLIMAZE)  injection, hydrALAZINE, lip balm, LORazepam, ondansetron (ZOFRAN) IV, sodium chloride flush ? ?Physical Exam         ?Appears chronically ill and frail ?Awake and alert ?Has JP drain ?Has ostomy ?Appears with generalized weakness ? ?Vital Signs: BP (!) 159/84   Pulse (!) 111   Temp 98.1 ?F (36.7 ?C) (Oral)   Resp (!) 21   Ht '5\' 10"'$  (1.778 m)   Wt 53.2 kg   SpO2 100%  BMI 16.83 kg/m?  ?SpO2: SpO2: 100 % ?O2 Device: O2 Device: Room Air ?O2 Flow Rate: O2 Flow Rate (L/min): 2 L/min ? ?Intake/output summary:  ?Intake/Output Summary (Last 24 hours) at 11/15/2021 1446 ?Last data filed at 11/15/2021 1417 ?Gross per 24 hour  ?Intake 2051.95 ml  ?Output 1560 ml  ?Net 491.95 ml  ? ? ?LBM: Last BM Date : 11/14/21 ?Baseline Weight: Weight: 54 kg ?Most recent weight: Weight: 53.2 kg ? ?     ?Palliative Assessment/Data: ? ? ? ? ? ?Patient Active Problem List  ? Diagnosis Date Noted  ? Septic shock ([place]) 11/09/2021  ? Postoperative intra-abdominal abscess 11/09/2021  ? 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 Malnutrition of moderate degree 11/07/2021  ? Palliative care by specialist   ? Goals of care, counseling/discussion   ? General weakness   ? Sinus tachycardia 11/05/2021  ? Normocytic anemia 11/04/2021  ? Acute respiratory failure with hypoxia () 11/03/2021  ? Acute metabolic encephalopathy 61/60/7371  ? Hypernatremia, hyponatremia, hypokalemia, hyperphosphatemia 11/03/2021  ? Hypokalemia 11/03/2021  ? Hyperphosphatemia 11/03/2021  ? Hyponatremia 11/03/2021  ? Elevated brain natriuretic peptide (BNP) level 11/03/2021  ? Right proximal humeral fracture 11/03/2021  ? Oropharyngeal dysphagia 11/03/2021  ? Colostomy in place [facility]) 10/30/2021  ? Stricture and stenosis of esophagus 10/30/2021  ? Gastrostomy tube in place [facility]) 10/30/2021  ? AKI (acute kidney injury) ([place]) 10/25/2021  ? Anxiety 10/24/2021  ? Chronic pain 10/24/2021  ? Allergic rhinitis 10/24/2021  ? 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 Tobacco use disorder 10/24/2021  ?  Underweight 10/24/2021  ? Acquired hammer toe of right foot 10/24/2021  ? Uncontrolled hypertension 10/24/2021  ? Urticaria 10/24/2021  ? Dry mouth 10/24/2021  ? Protein-calorie malnutrition, severe ([place]) 10/24/2021  ? Severe sepsis due to perforated sigmoid colon s/p Hartmann/colostomy 10/22/2021 10/22/2021  ? Substance induced mood disorder ([place])   ? ? ?Palliative Care Assessment & Plan  ? ?Patient Profile: ?  ? ?Assessment: ?61 year old lady with history of alcohol use disorder, tobacco use, recent right proximal humeral fracture, presented to the hospital at this time with diffuse abdominal pain.  Was found to have sepsis sigmoid perforation, was placed on antibiotics and underwent ex lap with Hartman's procedure and G-tube placement on 10-22-2021.  Postop course complicated by patient requiring reintubation and vasopressors on 4-17 and then she was extubated on 4-20.  Periodically has required Precedex due to agitation and hallucinations.  Found to have postop abdominal abscess requiring drainage on 4-22.  Started on TPN due to tube feed intolerance.  Weaned off of vasopressors as well as Precedex.  Hospital course complicated by feculent output from laparotomy site and repeat CT abdomen on 4-28 with new fluid collection within the cul-de-sac.  Remains on IV antibiotics and TPN.  Interventional radiology and general surgery are following.  Palliative medicine team consulted for ongoing goals of care discussions.  At the time of initial consultation patient had endorsed full code/full scope.  She had stated that she had survived her colon bursting inside of her and hence wished to continue with any and all means to get better.  Hospital course complicated by patient having new fluid collection in her abdomen on repeat imaging as well as ongoing weakness. ? ?Recommendations/Plan: ?Continue current mode of care. ?Her boyfriend [name] [place] has been designated as her HCPOA agent as per her wishes, appreciate  spiritual care assistance. ?Monitor hospital course, recommend SNF rehab with palliative.   ? ?Goals of Care and Additional Recommendations: ?Limitations on Scope  of Treatment: Full Scope Treatment ? ?Code Status: ? ?  ?Code Status Orders  ?(From admission, onward)  ?  ? ? ?  ? ?  Start     Ordered  ? 10/22/21 1429  Full code  Continuous       ? 10/22/21 1431  ? ?  ?  ? ?  ? ?Code Status History   ? ? Date Active Date Inactive Code Status Order ID Comments User Context  ? 12/04/2017 2253 12/12/2017 1930 Full Code [id number]  Reyne Dumas, MD ED  ? ?  ? ? ?Prognosis: ? Guarded  ? ?Discharge Planning: ?To Be Determined based on hospital course.  ? ?Care plan was discussed with RN and patient.  ? ?Thank you for allowing the Palliative Medicine Team to assist in the care of this patient. ? ?Loistine Chance, MD ? ?Please contact Palliative Medicine Team phone at [phone] for questions and concerns.  ? ? ? ? ? ?

## 2021-11-15 NOTE — Progress Notes (Signed)
? ? ?Assessment & Plan: ?POD#24 - PERFORATED SIGMOID COLON - status post EX LAP, HARTMAN'S PROCEDURE, Gastrostomy tube placement - 0/86/7619 Dr. Leighton Ruff ?OR FINDINGS: Purulent ascites throughout the abdomen and stool contamination in the pelvis due to necrotic perforated sigmoid colon ?- Status post drainage of LLQ fluid collection by IR - 4/22, Cx with pseudomonas ?- Midline wound with colocutaneous fistula. Continue Eakins pouch. WOCN following for colostomy and pouching of fistula ?- CT 5/4 also with pelvic fluid collection measuring 6 x 3.2 cm. Discussed with IR and not felt amenable/safe for drainage. IV abx. ?-  G-tube clamped 5/7. Patient tolerating. Tolerating ice chips/sips. SLP eval today and plan to advance to a liquid diet. ?  ?FEN - G-tube capped, TPN, sips/chips; SLP eval. ?VTE - SCDs, SQH ?ID - zosyn 4/17 - 4/27; 4/28-5/6, 5/8 >> ?  ?IV abx. advance diet to liquids if cleared by speech. Keep G tube capped. Place to gravity if pt develops nausea/vomiting. Monitor temps and WBC. May need repeat CT this week for persistent fever, leukocytosis. ? ?      Obie Dredge, PA-C ?[facility] ?A [facility] practice ?Office: [phone] ?       ?Chief Complaint: ?Perforated colon ? ?Subjective: ?Patient in bed, resting. agrees to let me examine her.  ? ?AFVSS ? ?Objective: ?Vital signs in last 24 hours: ?Temp:  [97.6 ?F (36.4 ?C)-100.9 ?F (38.3 ?C)] 97.6 ?F (36.4 ?C) (05/09 0825) ?Pulse Rate:  [94-121] 98 (05/09 0800) ?Resp:  [14-25] 20 (05/09 0800) ?BP: (138-191)/(68-95) 162/87 (05/09 0800) ?SpO2:  [94 %-100 %] 97 % (05/09 0800) ?Weight:  [53.2 kg] 53.2 kg (05/09 0354) ?Last BM Date : 11/14/21 ? ?Intake/Output from previous day: ?05/08 0701 - 05/09 0700 ?In: 2658.4 [P.O.:225; I.V.:2014.5; IV Piggyback:148.9] ?Out: 1010 [Urine:900; Stool:50] ?Intake/Output this shift: ?No intake/output data recorded. ? ?Physical Exam: ?HEENT - sclerae clear, mucous membranes moist ?Neck - soft ?Abdomen -  soft, overall nontender ? Midline wound/[facility] - eakins pouch in place with purulent and SS material.  ? LLQ ostomy - stoma viable, small amount dark liquid stool in pouch  ? JP - no drainage ? G tube - capped ?Ext - no edema, non-tender ? ?Lab Results:  ?No results for input(s): WBC, HGB, HCT, PLT in the last 72 hours. ? ?BMET ?Recent Labs  ?  11/14/21 ?0430 11/15/21 ?0352  ?NA 133* 133*  ?K 5.3* 3.3*  ?CL 105 104  ?CO2 18* 20*  ?GLUCOSE 123* 126*  ?BUN 64* 56*  ?CREATININE 1.19* 1.14*  ?CALCIUM 8.8* 8.3*  ? ?PT/INR ?No results for input(s): LABPROT, INR in the last 72 hours. ?Comprehensive Metabolic Panel: ?   ?Component Value Date/Time  ? NA 133 (L) 11/15/2021 0352  ? NA 133 (L) 11/14/2021 0430  ? K 3.3 (L) 11/15/2021 0352  ? K 5.3 (H) 11/14/2021 0430  ? CL 104 11/15/2021 0352  ? CL 105 11/14/2021 0430  ? CO2 20 (L) 11/15/2021 0352  ? CO2 18 (L) 11/14/2021 0430  ? BUN 56 (H) 11/15/2021 0352  ? BUN 64 (H) 11/14/2021 0430  ? CREATININE 1.14 (H) 11/15/2021 0352  ? CREATININE 1.19 (H) 11/14/2021 0430  ? GLUCOSE 126 (H) 11/15/2021 0352  ? GLUCOSE 123 (H) 11/14/2021 0430  ? CALCIUM 8.3 (L) 11/15/2021 0352  ? CALCIUM 8.8 (L) 11/14/2021 0430  ? AST 27 11/14/2021 0430  ? AST 18 11/10/2021 0250  ? ALT 22 11/14/2021 0430  ? ALT 18 11/10/2021 0250  ? ALKPHOS 129 (H) 11/14/2021  0430  ? ALKPHOS 90 11/10/2021 0250  ? BILITOT 0.9 11/14/2021 0430  ? BILITOT 0.6 11/10/2021 0250  ? PROT 7.0 11/14/2021 0430  ? PROT 6.4 (L) 11/10/2021 0250  ? ALBUMIN 2.2 (L) 11/14/2021 0430  ? ALBUMIN 1.9 (L) 11/10/2021 0250  ? ? ?Studies/Results: ?No results found. ? ? ? ?[place] ?11/15/2021 ? ?  ?

## 2021-11-15 NOTE — Progress Notes (Signed)
P t refused breathing tx and cpt and said later. RT will continue to monitor ?

## 2021-11-15 NOTE — Progress Notes (Signed)
? ? ?Supervising Physician: Markus Daft ? ?Patient Status:  [facility] - In-pt ? ?Chief Complaint: ? ?Perforated bowel s/p emergent ex lap with [place] and G tube placement on 1/02, recovery complicated by intraabdominal fluid collection development, s/p intraabdominal drain placement by Dr. Earleen Newport on 4/22. ? ?Subjective: ? ?Awake, in bed, Feeling hungry.  Looking forward to popsicles and liquids.   ? ?Allergies: ?Patient has no known allergies. ? ?Medications: ?Prior to Admission medications   ?Medication Sig Start Date End Date Taking? Authorizing Provider  ?fluticasone (FLONASE) 50 MCG/ACT nasal spray Place 1-2 sprays into both nostrils daily as needed for allergies or rhinitis.   Yes [provider]  ?loratadine (CLARITIN) 10 MG tablet Take 10 mg by mouth daily as needed for allergies.   Yes [provider]  ?Multiple Vitamins-Minerals (MULTIVITAMIN ADULTS 50+) TABS Take 1 tablet by mouth every morning.   Yes [provider]  ?ondansetron (ZOFRAN) 8 MG tablet Take 8 mg by mouth daily as needed for nausea/vomiting. 10/10/21  Yes [provider]  ?cetirizine (ZYRTEC) 10 MG tablet Take 10 mg by mouth daily as needed for allergies.    [provider]  ?diclofenac Sodium (VOLTAREN) 1 % GEL Apply 4 g topically 4 (four) times daily. ?Patient not taking: Reported on 10/22/2021 09/21/21   Deno Etienne, DO  ?mirtazapine (REMERON SOL-TAB) 15 MG disintegrating tablet Take 1 tablet (15 mg total) by mouth at bedtime. 12/12/17 01/11/18  Arrien, Jimmy Picket, MD  ?morphine (MSIR) 15 MG tablet Take 0.5 tablets (7.5 mg total) by mouth every 4 (four) hours as needed for severe pain. ?Patient not taking: Reported on 10/22/2021 09/26/21   Vanetta Mulders, MD  ? ? ? ?Vital Signs: ?BP (!) 147/79 (BP Location: Right Arm)   Pulse (!) 101   Temp 98.1 ?F (36.7 ?C) (Oral)   Resp 17   Ht '5\' 10"'$  (1.778 m)   Wt 117 lb 4.6 oz (53.2 kg)   SpO2 99%   BMI 16.83 kg/m?  ? ?Physical Exam ?Constitutional:   ?    Appearance: She is ill-appearing.  ?HENT:  ?   Head: Normocephalic and atraumatic.  ?Pulmonary:  ?   Effort: Pulmonary effort is normal.  ?Skin: ?   General: Skin is warm and dry.  ?   Capillary Refill: Capillary refill takes less than 2 seconds.  ?Neurological:  ?   General: No focal deficit present.  ?   Mental Status: She is alert.  ?Psychiatric:     ?   Mood and Affect: Mood normal.     ?   Behavior: Behavior normal.  ? ?Drain Location: LLQ ?Size: Fr size: 10 Fr ?Date of placement: 10/29/21 ?Currently to: Drain collection device: suction bulb ?24 hour output:  ?Output by Drain (mL) 11/13/21 0701 - 11/13/21 1900 11/13/21 1901 - 11/14/21 0700 11/14/21 0701 - 11/14/21 1900 11/14/21 1901 - 11/15/21 0700 11/15/21 0701 - 11/15/21 1501  ?Closed System Drain Left Abdomen Bulb (JP) 10.2 Fr. 0  0 0 0  ?Gastrostomy/Enterostomy Gastrostomy 22 Fr. LUQ 90      ? ? ?Current examination: ?Flushes easily.  Does not aspirate ?Insertion site unremarkable. ?Suture in place. ?No OP ? ? ? ?Imaging: ?No results found. ? ?Labs: ? ?CBC: ?Recent Labs  ?  11/07/21 ?1832 11/08/21 ?7253 11/09/21 ?0258 11/09/21 ?1545 11/11/21 ?1013  ?WBC 13.5* 14.5* 13.0*  --  10.2  ?HGB 8.2* 7.1* 6.6* 8.3* 8.7*  ?HCT 26.8* 22.3* 20.7* 25.4* 26.5*  ?PLT 386 373 376  --  501*  ? ? ?COAGS: ?Recent Labs  ?  10/22/21 ?2119 10/29/21 ?0650 10/30/21 ?0337  ?INR 1.3* 1.2 1.2  ? ? ?BMP: ?Recent Labs  ?  11/12/21 ?1779 11/13/21 ?3903 11/14/21 ?0430 11/15/21 ?0092  ?NA 137 136 133* 133*  ?K 4.3 4.8 5.3* 3.3*  ?CL 105 105 105 104  ?CO2 23 23 18* 20*  ?GLUCOSE 123* 112* 123* 126*  ?BUN 64* 62* 64* 56*  ?CALCIUM 8.8* 8.8* 8.8* 8.3*  ?CREATININE 1.26* 1.19* 1.19* 1.14*  ?GFRNONAA 49* 52* 52* 55*  ? ? ?LIVER FUNCTION TESTS: ?Recent Labs  ?  11/07/21 ?1919 11/09/21 ?0258 11/10/21 ?0250 11/14/21 ?0430  ?BILITOT 0.9 0.7 0.6 0.9  ?AST '20 18 18 27  '$ ?ALT '23 18 18 22  '$ ?ALKPHOS 96 86 90 129*  ?PROT 6.8 6.4* 6.4* 7.0  ?ALBUMIN 2.2* 1.9* 1.9* 2.2*  ? ? ?Assessment and  Plan: ?Intra-abdominal fluid collection ?-- Drain in place for 17 days ?-- No OP, will set up for repeat drain injection (last one was 1 week ago), and possible repositioning. ?--Continue BID flushes with 5 cc NS.  Record output Q shift.  Dressing changes QD or PRN if soiled.  ? ? ?Call IR APP or on call IR MD if difficulty flushing or sudden change in drain output.  ?Repeat imaging/possible drain injection once output < 10 mL/QD (excluding flush material.) ? ?Discharge planning: ?Please contact IR APP or on call IR MD prior to patient d/c to ensure appropriate follow up plans are in place. Typically patient will follow up with IR clinic 10-14 days post d/c for repeat imaging/possible drain injection. IR scheduler will contact patient with date/time of appointment. Patient will need to flush drain QD with 5 cc NS, record output QD, dressing changes every 2-3 days or earlier if soiled.  ? ?IR will continue to follow - please call with questions or concerns. ? ? ?Electronically Signed: ?Pasty Spillers, PA ?11/15/2021, 1:49 PM ? ? ?I spent a total of 15 Minutes at the the patient's bedside AND on the patient's hospital floor or unit, greater than 50% of which was counseling/coordinating care for drain management ? ? ? ? ? ?

## 2021-11-15 NOTE — Progress Notes (Signed)
Physical Therapy Treatment ?Patient Details ?Name: Jessica [name] ?MRN: [id number] ?DOB: 22-Nov-1960 ?Today's Date: 11/15/2021 ? ? 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  Pt intubated post op and extubated 10/27/21. Re Intubated 11/07/21.   Pt with hx of ETOH abuse and recent R shoulder fx (09/20/21) with X ray from 10/27/21 indicating fx still present. ? ?  ?PT Comments  ? ? Pt's cognition has improved since last session, she was better able to participate in PT and required less assistance for bed mobility. She was able to maintain sitting balance at edge of bed without assistance and stood to transfer to a recliner with +2 min assist. Pt reported being lightheaded in sitting but was not orthostatic. Good progress with mobility today.    ?Recommendations for follow up therapy are one component of a multi-disciplinary discharge planning process, led by the attending physician.  Recommendations may be updated based on patient status, additional functional criteria and insurance authorization. ? ?Follow Up Recommendations ? Skilled nursing-short term rehab (<3 hours/day) ?  ?  ?Assistance Recommended at Discharge    ?Patient can return home with the following Two people to help with walking and/or transfers;A lot of help with bathing/dressing/bathroom;Assistance with cooking/housework;Assist for transportation;Help with stairs or ramp for entrance ?  ?Equipment Recommendations ? None recommended by PT  ?  ?Recommendations for Other Services OT consult ? ? ?  ?Precautions / Restrictions Precautions ?Precautions: Fall ?Precaution Comments: multiple lines--> L JP drain, gastrostomy tube, triple lumen central line ?Restrictions ?Weight Bearing Restrictions: Yes ?RUE Weight Bearing: Non weight bearing ?Other Position/Activity Restrictions: recent R UE/shoulder fx - assume NWB, though per chart review RUE WB status expired on 10/27/21.  ?   ? ?Mobility ? Bed Mobility ?Overal bed mobility: Needs Assistance ?Bed Mobility: Supine to Sit ?  ?  ?Supine to sit: +2 for physical assistance, +2 for safety/equipment, Max assist ?  ?  ?General bed mobility comments: assist to raise trunk and pivot hips to edge of bed, pt 30% ?  ? ?Transfers ?Overall transfer level: Needs assistance ?Equipment used: 2 person hand held assist ?Transfers: Sit to/from Stand, Bed to chair/wheelchair/BSC ?Sit to Stand: Min assist, +2 safety/equipment ?  ?Step pivot transfers: +2 safety/equipment, Min assist ?  ?  ?  ?General transfer comment: assist to power up and to steady, pt able to take a few pivotal steps to recliner, BLEs tremulous in weight bearing ?  ? ?Ambulation/Gait ?  ?  ?  ?  ?  ?  ?  ?General Gait Details: deferred 2* fatigue ? ? ?Stairs ?  ?  ?  ?  ?  ? ? ?Wheelchair Mobility ?  ? ?Modified Rankin (Stroke Patients Only) ?  ? ? ?  ?Balance Overall balance assessment: Needs assistance ?Sitting-balance support: Feet supported, Single extremity supported ?Sitting balance-Leahy Scale: Poor ?Sitting balance - Comments: able to maintain trunk upright with single UE support ?  ?  ?  ?  ?  ?  ?  ?  ?  ?  ?  ?  ?  ?  ?  ?  ? ?  ?Cognition Arousal/Alertness: Awake/alert ?Behavior During Therapy: [facility] for tasks assessed/performed ?Overall Cognitive Status: Impaired/Different from baseline ?  ?  ?  ?  ?  ?  ?  ?  ?  ?  ?  ?  ?Following Commands: Follows one step commands consistently, Follows multi-step commands with increased time ?  ?  ?  Problem Solving: Slow processing, Decreased initiation, Difficulty sequencing, Requires verbal cues, Requires tactile cues ?General Comments: pt is aware that she has some memory deficits - "I've been hallucinating so I don't know what's real or not". ?  ?  ? ?  ?Exercises   ? ?  ?General Comments   ?  ?  ? ?Pertinent Vitals/Pain Pain Assessment ?Faces Pain Scale: Hurts little more ?Pain Location: "all over" with movement ?Pain Descriptors /  Indicators: Discomfort, Grimacing ?Pain Intervention(s): Limited activity within patient's tolerance, Monitored during session, Repositioned  ? ? ?Home Living   ?  ?  ?  ?  ?  ?  ?  ?  ?  ?   ?  ?Prior Function    ?  ?  ?   ? ?PT Goals (current goals can now be found in the care plan section) Acute Rehab PT Goals ?Patient Stated Goal: to get my back stronger ?PT Goal Formulation: With patient ?Time For Goal Achievement: 11/11/21 ?Potential to Achieve Goals: Fair ?Progress towards PT goals: Progressing toward goals ? ?  ?Frequency ? ? ? Min 2X/week ? ? ? ?  ?PT Plan Current plan remains appropriate  ? ? ?Co-evaluation   ?  ?  ?  ?  ? ?  ?AM-PAC PT "6 Clicks" Mobility   ?Outcome Measure ? Help needed turning from your back to your side while in a flat bed without using bedrails?: Total ?Help needed moving from lying on your back to sitting on the side of a flat bed without using bedrails?: Total ?Help needed moving to and from a bed to a chair (including a wheelchair)?: A Lot ?Help needed standing up from a chair using your arms (e.g., wheelchair or bedside chair)?: A Lot ?Help needed to walk in hospital room?: Total ?Help needed climbing 3-5 steps with a railing? : Total ?6 Click Score: 8 ? ?  ?End of Session Equipment Utilized During Treatment: Gait belt ?Activity Tolerance: Patient limited by fatigue;Patient tolerated treatment well ?Patient left: in chair;with chair alarm set;with call bell/phone within reach;with nursing/sitter in room ?Nurse Communication: Mobility status ?PT Visit Diagnosis: Muscle weakness (generalized) (M62.81);History of falling (Z91.81);Difficulty in walking, not elsewhere classified (R26.2);Pain ?Pain - Right/Left: Right ?Pain - part of body: Shoulder ?  ? ? ?Time: 0355-9741 ?PT Time Calculation (min) (ACUTE ONLY): 21 min ? ?Charges:  $Therapeutic Activity: 8-22 mins          ?          ? ?Philomena Doheny PT 11/15/2021  ?Acute Rehabilitation Services ?Pager [phone] ?Office  [phone] ? ? ?

## 2021-11-15 NOTE — Evaluation (Addendum)
Clinical/Bedside Swallow Evaluation ?Patient Details  ?Name: Jessica [name] ?MRN: [id number] ?Date of Birth: 20-Nov-1960 ? ?Today's Date: 11/15/2021 ?Time: SLP Start Time (ACUTE ONLY): 1502 SLP Stop Time (ACUTE ONLY): 2563 ?SLP Time Calculation (min) (ACUTE ONLY): 33 min ? ?Past Medical History:  ?Past Medical History:  ?Diagnosis Date  ? Bacteremia due to Gram-negative bacteria 11/03/2021  ? Dehydration 11/05/2021  ? Delirium tremens ([place]) 12/04/2017  ? Hypernatremia 11/03/2021  ? Thrombocytopenia ([place]) 11/03/2021  ? ?Past Surgical History:  ?Past Surgical History:  ?Procedure Laterality Date  ? LAPAROTOMY N/A 10/22/2021  ? Procedure: EXPLORATORY LAPAROTOMY hartmans;  Surgeon: Leighton Ruff, MD;  Location: [facility] ORS;  Service: General;  Laterality: N/A;  ? ?HPI:  ?77 year old lady who lives at home with her boyfriend, admitted with abdominal pain.  Has a history of alcohol use and was found to have perforated sigmoid colon.  Underwent ex lap Hartman's procedure, gastrostomy tube placement on 10-22-2021. Was put on TPN for severe protein calorie malnutrition.  History of chronic right proximal humerus fracture as well. Patient is awake alert resting in bed.  She has some degree of dysarthria per notes.  Pt hospital coarse also complicated by AMS, infection, aspiration, etc.  Pt underwent surgery on 4/15 - extubated in OR, reintubated 4/17, extubated 4/20, reintubated 5/1 and extubated 5/3. Swallow eval ordered.  Last chest imaging showed Small left pleural effusion with lower lobe atelectasis. Left  lower lobe aeration is improved from prior CT.  ?  ?Assessment / Plan / Recommendation  ?Clinical Impression ? Patient today has a clear voice- fortunately- without concerns for secretion management *as during prior session 4/26. No focal CN deficit but her reflexive cough is weak - increasing pna risk is she aspirtes.  With 7/18 thin liquid boluses, pt presented with delayed weak cough.  Delayed weak cough occured with 1/5 nectar  thick boluses - Pt reports it is more difficult for her to drink the thicker consistencies- pointing to her esophagus to indicate area of retention.  Chin tuck posture did not prevent subtle cough.  Increased respiratory effort noted with intake - causing pt to use her accessory muscles with inhalation immediate post-swallow. Pt admits to "shortness of breath all of the time".   SLP can not rule out an acute pharyngeal dysphagia from reintubation, deconditioning that may acutely cause edema/airway compromise.  Nor can SLP rule out an esophageal issue as pt sensed esophageal retention with few boluses of nectar juice and demonstrated eructation at the end of the small snack. She denies refluxing into pharynx. - - Recommend consider conservative po administration of clear liquids via tsp -- with strict precautions including ceasing intake if dyspneic or coughing. Using teach back, educated pt to recommendations. SLP will follow pt clinically for tolerance and readiness for dietary advancement. Pt benefits from encouragement to sit upright as she prefers to be further reclined.   ?SLP Visit Diagnosis: Dysphagia, oropharyngeal phase (R13.12) ?   ?Aspiration Risk ? Moderate aspiration risk  ?  ?Diet Recommendation Thin liquid (clears via tsp with clos monitoring of tolerance)  ? ?Liquid Administration via: Spoon ?Medication Administration: Via alternative means ?Supervision: Full supervision/cueing for compensatory strategies ?Compensations: Slow rate;Small sips/bites ?Postural Changes: Seated upright at 90 degrees;Remain upright for at least 30 minutes after po intake  ?  ?Other  Recommendations Oral Care Recommendations: Oral care QID   ? ?Recommendations for follow up therapy are one component of a multi-disciplinary discharge planning process, led by the attending physician.  Recommendations may  be updated based on patient status, additional functional criteria and insurance authorization. ? ?Follow up  Recommendations Skilled nursing-short term rehab (<3 hours/day)  ? ? ?  ?Assistance Recommended at Discharge  Full supervision  ?Functional Status Assessment Patient has had a recent decline in their functional status and demonstrates the ability to make significant improvements in function in a reasonable and predictable amount of time.  ?Frequency and Duration min 1 x/week  ?1 week ?  ?   ? ?Prognosis Prognosis for Safe Diet Advancement: Fair ?Barriers to Reach Goals: Time post onset;Other (Comment) (deconditioning)  ? ?  ? ?Swallow Study   ?General Date of Onset: 11/15/21 ?HPI: 23 year old lady who lives at home with her boyfriend, admitted with abdominal pain.  Has a history of alcohol use and was found to have perforated sigmoid colon.  Underwent ex lap Hartman's procedure, gastrostomy tube placement on 10-22-2021. Was put on TPN for severe protein calorie malnutrition.  History of chronic right proximal humerus fracture as well. Patient is awake alert resting in bed.  She has some degree of dysarthria per notes.  Pt hospital coarse also complicated by AMS, infection, aspiration, etc.  Pt underwent surgery on 4/15 - extubated in OR, reintubated 4/17, extubated 4/20, reintubated 5/1 and extubated 5/3. Swallow eval ordered.  Last chest imaging showed Small left pleural effusion with lower lobe atelectasis. Left  lower lobe aeration is improved from prior CT. ?Type of Study: Bedside Swallow Evaluation ?Previous Swallow Assessment: none ?Diet Prior to this Study:  (sips, chips) ?Temperature Spikes Noted: No ?Respiratory Status: Room air ?History of Recent Intubation: Yes ?Length of Intubations (days):  (intubated x3 during hospital coarse) ?Date extubated:  (see HPI, last extubation 5/3) ?Behavior/Cognition: Alert;Cooperative ?Oral Cavity Assessment: Within Functional Limits ?Oral Care Completed by SLP: Yes ?Oral Cavity - Dentition: Adequate natural dentition ?Vision: Functional for self-feeding ?Self-Feeding  Abilities: Able to feed self ?Patient Positioning: Upright in bed ?Baseline Vocal Quality: Normal ?Volitional Cough: Weak ?Volitional Swallow: Able to elicit  ?  ?Oral/Motor/Sensory Function Overall Oral Motor/Sensory Function: Within functional limits   ?Ice Chips Ice chips: Within functional limits ?Presentation: Spoon   ?Thin Liquid Thin Liquid: Impaired ?Presentation: Cup;Spoon ?Pharyngeal  Phase Impairments: Throat Clearing - Delayed;Cough - Delayed ?Other Comments: delayed weak cough post swallow of 7/18 boluses; chin tuck posture did not fully prevent indication of aspiration  ?  ?Nectar Thick Nectar Thick Liquid: Impaired ?Presentation: Cup;Self Fed;Spoon ?Pharyngeal Phase Impairments: Cough - Delayed ?Other Comments: delayed cough x1 of 5 swallows but pt reported sensation of retention in esophagus   ?Honey Thick Honey Thick Liquid: Not tested   ?Puree Puree: Not tested   ?Solid ? ? ?  Solid: Not tested  ? ?  ? ?[name] ?11/15/2021,4:06 PM ?Kathleen Lime, MS [facility] SLP ?Acute Rehab Services ?Office [phone] ?Pager [phone] ? ? ? ?

## 2021-11-15 NOTE — Progress Notes (Signed)
[facility] ?PROGRESS NOTE ? ? ? ?Progress Note  ?Jessica [name]  TFT:[id number] DOB: [birth year] DOA: 10/22/2021 ?PCP: Deland Pretty, MD  ? ? ? ?Brief Narrative:  ? ?Jessica [name] is an 61 y.o. female past medical history significant for alcohol abuse, with admissions for severe alcohol withdrawals requiring ICU admissions tobacco use presented to the ED on 10/22/2021 diffuse abdominal pain that started the day prior to admission.  CT of the abdomen pelvis showed pneumoperitoneum and free fluid, severe sepsis with started on broad-spectrum antibiotics fluid resuscitated surgery was consulted was taken emergently to the OR underwent exploratory laparotomy and was found to have a perforated sigmoid colon, status post Hartmann procedure and insertion of the G-tube which was left to drain to gravity, JP in pelvis was noted to have feculent peritonitis in the pelvis.  ICU course was complicated by Klebsiella bacteremia profound caloric protein malnutrition requiring TPN, acute respiratory failure with hypoxia and tachycardia with failure to progress ? ? ?Significant Events: ?10/22/21 ex lap, hartman's procedure, g tube placement. Extubated in OR ?4/17 Reintubated overnight with continued pressor support x2. 11 liters positive thus far with progressive AKI this am  ?4/18 Continued issues with electrolyte abnormalities overnight, remains on levo and neo. She is now 14L positive but urine output has increased  ?4/19 No issues overnight, remains 14L positive but urine output continues to improve. Start to slowly diureses today  ?4/20 weaning NE, extubated, diuresed, started trickle TF, remains on precedex for agitation  ?4/21 periods of agitation/ hallucinations, scheduled ativan helps, remains on precedex 0.7. CT A/P with suspicious for partial small bowel obstruction, with transition point in the lower central pelvis. Increased small to moderate amount of ascites, without definite focal abscess collection. Small amount  of postop free air also noted. New small left pleural effusion and left lower lobe atelectasis. New diffuse body wall edema, consistent with anasarca. Cholelithiasis.  ?4/22 On  TPN.  Did not tolerated TF yesterday -vomitted and now G tube to LIS with bilios returns.  Low grade fever +. WBC now normal. On Zosyn. Hgb < 7g% without overt bleeding - 1 U PRBC. delirium worse.  ?4/24 Remains off vasopressors, overnight with agitation requiring precedex, 5 mg Haldol, 2 mg versed ?4/25 Out of bed. SLP eval declined by patient as well as OT.  ?4/26 Off precedex. Seen by palliative care > pt requested full code. No family.  ?4/27 Transferred to [place]  ?5/1 PCCM called back for AMS, intubated for declining mental status and hypoxia. Back in Septic shock, working dx on-going abd sepsis and new aspiration PNA  ?5/2 Followed by IR and surgery both concerned about on-going intrabd infection. 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 Both teams wanting contrasted film but renal fxn barrier. ?5/3 got blood. Passed SBT->extubated  ?5/4 ct abd and pelvis  CT scan of the abdomen pelvis showed ventralcollection and midline wound, drainage fistula which catheter resides within this collection.  ? ?Assessment/Plan:  ? ?Severe septic shock due to perforated sigmoid colon s/p Hartmann/colostomy 10/22/2021/with G-tube placement 10/21/2021/.  Complicated by colocutaneous fistula with Pseudomonas intra-abdominal leak requiring percutaneous drainage: ?Lower quadrant drain managed per IR placed on 10/29/2021. ?Repeated CT scan on 11/10/2031 showed new pelvic collection not amenable to drainage likely an abscess. ?Patient is febrile, with a Tmax of 100.9. ?CT on 10/10/2021 showed pelvic fluid collection not amenable to drainage.  Lesion abscess. ?Keep the patient n.p.o. continue NG tube and TNA and antibiotics per surgery. ?Antibiotics were resumed on 11/14/2021. ? ?Septic  shock secondary to aspiration pneumonia and ongoing abdominal infection: ?Has been  weaned off pressors , she is positive about 9 L.   ?IV fluids have been KVO. ?Once her potassium above 3.5 can start her on low-dose Lasix. ? ?Acute respiratory failure with hypoxia secondary to aspiration pneumonia: ?Extubated on 10/27/2021 due to altered mental status had to be reintubated on 11/07/2021. ?Now extubated on 11/26/2021. ?Currently n.p.o. ,now wean to room air. ?Physical therapy is on board. Will need SNF vs LTAC. ? ?Acute encephalopathy multifactorial including but not limited to ICU delirium, infectious etiology, alcohol withdrawal: ?He has been off sedation, have only been using low-dose Ativan. ?Continue low-dose fentanyl for pain. ?Limit sedation. ? ?Acute kidney injury secondary to severe septic shock: ?Was on IV diuresis, diuretics were held she is positive about 9 L. ?Her potassium is low.  We will have to replete. ?Continue monitor strict I's and O's, creatinine this morning 1.1. ? ?Electrolyte imbalance: ?Potassium 3.3 magnesium 1.8. ?Try to keep potassium greater than 4 magnesium greater than 2. ? ?Normocytic anemia: ?No evidence of bleeding ?Status post 20 of packed red blood cells as his hemoglobin was less than 7, this morning 8.3. ?Continue to monitor hemoglobin intermittently. ? ?Severe protein caloric malnutrition/cachexia: ?Currently on TPN. ?G-tube placed on 10/22/2021, currently to suction ?Speech on board to evaluate. ?Estimated body mass index is 17.11 kg/m? as calculated from the following: ?  Height as of this encounter: '5\' 10"'$  (1.778 m). ?  Weight as of this encounter: 54.1 kg. ? ?Right proximal humerus fracture: ?Status post fall in March 2023 seen by Ortho during that month with plan for nonweightbearing for 1 month then follow-up with them as an outpatient. ? ?Klebsiella bacteremia ?blood cultures on 10/23/2018 ?She was initially placed on vancomycin Flagyl and cefepime on 10/22/2021 ?De-escalated to IV Zosyn 10/24/2021, she has now completed 2-week course of IV antibiotics for  bacteremia. ?Continue IV Zosyn per surgery for intra-abdominal infection ? ?Oropharyngeal dysphagia: ?Speech on board to evaluate. ? ?Hypernatremia//hyponatremia: ?Resolved with IV diuresis likely hypovolemic. ?Sodium today is 133. ? ? ?Mild hyperkalemia: ?Resolved with changes made to TNA. ?Continue to follow electrolytes closely. ? ?Substance induced mood disorder (HCC)/ Anxiety ? ?Goals of care/ethics: ?She remains a full code ?Palliative care has been consulted for complicated situation in regards to her surrogate decision-making ? ?Vertebral Pressure ulcer present ?RN Pressure Injury Documentation: ?Pressure Injury 10/27/21 Vertebral column Mid;Medial Deep Tissue Pressure Injury - Purple or maroon localized area of discolored intact skin or blood-filled blister due to damage of underlying soft tissue from pressure and/or shear. (Active)  ?10/27/21 0428  ?Location: Vertebral column  ?Location Orientation: Mid;Medial  ?Staging: Deep Tissue Pressure Injury - Purple or maroon localized area of discolored intact skin or blood-filled blister due to damage of underlying soft tissue from pressure and/or shear.  ?Wound Description (Comments):   ?Present on Admission: No  ?Dressing Type Foam - Lift dressing to assess site every shift 11/14/21 2000  ? ? ? ? ?DVT prophylaxis: lovenox ?Family Communication:none ?Status is: Inpatient ?Remains inpatient appropriate because: She has multiple medical problems including septic shock due to perforated bowels and aspiration ? ? ? ? ? ?Code Status:  ? ?  ?Code Status Orders  ?(From admission, onward)  ?  ? ? ?  ? ?  Start     Ordered  ? 10/22/21 1429  Full code  Continuous       ? 10/22/21 1431  ? ?  ?  ? ?  ? ?  Code Status History   ? ? Date Active Date Inactive Code Status Order ID Comments User Context  ? 12/04/2017 2253 12/12/2017 1930 Full Code [id number]  Reyne Dumas, MD ED  ? ?  ? ? ? ? ?IV Access:  ? ?Peripheral IV ? ? ?Procedures and diagnostic studies:  ? ?No results  found. ? ? ?Medical Consultants:  ? ?None. ? ? ?Subjective:  ? ? ?Jessica [name] did not want to work with physical therapy yesterday. ? ?Objective:  ? ? ?Vitals:  ? 11/15/21 0354 11/15/21 0400 11/15/21 050

## 2021-11-16 ENCOUNTER — Inpatient Hospital Stay (HOSPITAL_COMMUNITY): Payer: Self-pay

## 2021-11-16 DIAGNOSIS — F1994 Other psychoactive substance use, unspecified with psychoactive substance-induced mood disorder: Secondary | ICD-10-CM

## 2021-11-16 DIAGNOSIS — R636 Underweight: Secondary | ICD-10-CM

## 2021-11-16 DIAGNOSIS — S42201A Unspecified fracture of upper end of right humerus, initial encounter for closed fracture: Secondary | ICD-10-CM

## 2021-11-16 DIAGNOSIS — L0291 Cutaneous abscess, unspecified: Secondary | ICD-10-CM

## 2021-11-16 DIAGNOSIS — T8143XA Infection following a procedure, organ and space surgical site, initial encounter: Secondary | ICD-10-CM

## 2021-11-16 HISTORY — PX: IR CATHETER TUBE CHANGE: IMG717

## 2021-11-16 LAB — GLUCOSE, CAPILLARY
Glucose-Capillary: 122 mg/dL — ABNORMAL HIGH (ref 70–99)
Glucose-Capillary: 124 mg/dL — ABNORMAL HIGH (ref 70–99)
Glucose-Capillary: 125 mg/dL — ABNORMAL HIGH (ref 70–99)
Glucose-Capillary: 130 mg/dL — ABNORMAL HIGH (ref 70–99)
Glucose-Capillary: 131 mg/dL — ABNORMAL HIGH (ref 70–99)

## 2021-11-16 LAB — BASIC METABOLIC PANEL
Anion gap: 8 (ref 5–15)
BUN: 48 mg/dL — ABNORMAL HIGH (ref 6–20)
CO2: 20 mmol/L — ABNORMAL LOW (ref 22–32)
Calcium: 8.3 mg/dL — ABNORMAL LOW (ref 8.9–10.3)
Chloride: 101 mmol/L (ref 98–111)
Creatinine, Ser: 0.99 mg/dL (ref 0.44–1.00)
GFR, Estimated: >60 mL/min (ref >60)
Glucose, Bld: 123 mg/dL — ABNORMAL HIGH (ref 70–99)
Potassium: 3.2 mmol/L — ABNORMAL LOW (ref 3.5–5.1)
Sodium: 129 mmol/L — ABNORMAL LOW (ref 135–145)

## 2021-11-16 LAB — PHOSPHORUS: Phosphorus: 2.9 mg/dL (ref 2.5–4.6)

## 2021-11-16 LAB — MAGNESIUM: Magnesium: 1.6 mg/dL — ABNORMAL LOW (ref 1.7–2.4)

## 2021-11-16 MED ORDER — LIDOCAINE HCL 1 % IJ SOLN
INTRAMUSCULAR | Status: DC | PRN
  Administered 2021-11-16: 5 mL

## 2021-11-16 MED ORDER — LIDOCAINE HCL 1 % IJ SOLN
INTRAMUSCULAR | Status: AC
  Filled 2021-11-16: qty 20

## 2021-11-16 MED ORDER — MAGNESIUM SULFATE IN D5W 1-5 GM/100ML-% IV SOLN
1.0000 g | Freq: Once | INTRAVENOUS | Status: AC
  Administered 2021-11-16: 1 g via INTRAVENOUS
  Filled 2021-11-16: qty 100

## 2021-11-16 MED ORDER — TRAVASOL 10 % IV SOLN
INTRAVENOUS | Status: AC
  Filled 2021-11-16: qty 1252.8

## 2021-11-16 MED ORDER — SODIUM CHLORIDE 0.9% FLUSH
5.0000 mL | Freq: Three times a day (TID) | INTRAVENOUS | Status: DC
  Administered 2021-11-16 – 2021-11-27 (×22): 5 mL

## 2021-11-16 MED ORDER — POTASSIUM CHLORIDE 10 MEQ/50ML IV SOLN
10.0000 meq | INTRAVENOUS | Status: AC
  Administered 2021-11-16 (×6): 10 meq via INTRAVENOUS
  Filled 2021-11-16 (×6): qty 50

## 2021-11-16 MED ORDER — IOHEXOL 300 MG/ML  SOLN
50.0000 mL | Freq: Once | INTRAMUSCULAR | Status: AC | PRN
  Administered 2021-11-16: 10 mL

## 2021-11-16 MED ORDER — BOOST / RESOURCE BREEZE PO LIQD CUSTOM
1.0000 | Freq: Two times a day (BID) | ORAL | Status: DC
Start: 2021-11-16 — End: 2021-11-23
  Administered 2021-11-22 (×2): 1 via ORAL

## 2021-11-16 MED ORDER — POTASSIUM CHLORIDE 10 MEQ/100ML IV SOLN: 10.0000 meq | INTRAVENOUS | Status: AC

## 2021-11-16 MED ORDER — MAGNESIUM SULFATE 2 GM/50ML IV SOLN
2.0000 g | Freq: Once | INTRAVENOUS | Status: AC
  Administered 2021-11-16: 2 g via INTRAVENOUS
  Filled 2021-11-16: qty 50

## 2021-11-16 NOTE — Progress Notes (Signed)
?PROGRESS NOTE ? ? ? ?Jessica [name]  WVP:[id number] DOB: 1961/03/25 DOA: 10/22/2021 ?PCP: Deland Pretty, MD  ? ? ? ?Brief Narrative:  ?Jessica [name] is an 61 y.o. female past medical history significant for alcohol abuse, with admissions for severe alcohol withdrawals requiring ICU admissions tobacco use presented to the ED on 10/22/2021 diffuse abdominal pain that started the day prior to admission.  CT of the abdomen pelvis showed pneumoperitoneum and free fluid, severe sepsis with started on broad-spectrum antibiotics fluid resuscitated surgery was consulted was taken emergently to the OR underwent exploratory laparotomy and was found to have a perforated sigmoid colon, status post Hartmann procedure and insertion of the G-tube which was left to drain to gravity, JP in pelvis was noted to have feculent peritonitis in the pelvis.  ICU course was complicated by Klebsiella bacteremia profound caloric protein malnutrition requiring TPN, acute respiratory failure with hypoxia and tachycardia with failure to progress ? ? ?Subjective: ?A/O x4, pain control ? ? ?Assessment & Plan: ?Covid vaccination; ?  ?Principal Problem: ?  Severe sepsis due to perforated sigmoid colon s/p Hartmann/colostomy 10/22/2021 ?Active Problems: ?  Acute respiratory failure with hypoxia ([place]) ?  Acute metabolic encephalopathy ?  Oropharyngeal dysphagia ?  Chronic pain ?  Tobacco use disorder ?  Protein-calorie malnutrition, severe ([place]) ?  AKI (acute kidney injury) ([place]) ?  Hypernatremia, hyponatremia, hypokalemia, hyperphosphatemia ?  Elevated brain natriuretic peptide (BNP) level ?  Right proximal humeral fracture ?  Normocytic anemia ?  Substance induced mood disorder ([place]) ?  Anxiety ?  Primary insomnia ?  Underweight ?  Colostomy in place [facility]) ?  Stricture and stenosis of esophagus ?  Gastrostomy tube in place [facility]) ?  Hyponatremia ?  Palliative care by specialist ?  Goals of care, counseling/discussion ?  General weakness ?  Septic shock  ([place]) ?  Postoperative intra-abdominal abscess ?  Colocutaneous fistula ?  Generalized abdominal pain ? ?Severe septic shock due to perforated sigmoid colon  ?-4/15/ 2023 s/p Hartmann/colostomy  ?-10/21/2021 s/p G-tube placement.  Complicated by colocutaneous fistula with Pseudomonas intra-abdominal leak requiring percutaneous drainage: ?-10/29/2021 lower quadrant drain placed: Managed per IR ?Repeated CT scan on 11/10/2031 showed new pelvic collection not amenable to drainage likely an abscess. ?Patient is febrile, with a Tmax of 100.9. ?-CT on 10/10/2021 showed pelvic fluid collection not amenable to drainage.  Lesion abscess. ?Keep the patient n.p.o. continue NG tube and TNA and antibiotics per surgery. ?-Antibiotics were resumed on 11/14/2021. ?-5/10 s/p fluoro guided exchange up-sizing and repositioning of now 14 Fr drainage catheter into peri-stomal collection yielding feculent debris ?-5/11 CT abdomen and pelvis pending ? ?Septic shock/Aspiration pneumonia/Abdominal infection: ?-Has been weaned off pressors  ?-Strict in and out +9.4 L ?- Daily weight ?Filed Weights  ? 11/14/21 0304 11/15/21 0354 11/16/21 0500  ?Weight: 50.8 kg 53.2 kg 54.3 kg  ?   ? ?Acute respiratory failure with hypoxia/Aspiration pneumonia: ?-Extubated on 10/27/2021 due to altered mental status had to be reintubated on 11/07/2021. ?-Extubated on 11/08/2021. ?-Currently n.p.o. ,now on room air. ?-Physical therapy is on board. Will need SNF vs LTAC. ?   ?Klebsiella bacteremia ?-Blood cultures on 10/23/2018 ?-Initially placed on vancomycin Flagyl and cefepime on 10/22/2021 ?-De-escalated to IV Zosyn 10/24/2021, she has now completed 2-week course of IV antibiotics for bacteremia. ?-Continue IV Zosyn per surgery for intra-abdominal infection ? ?Acute encephalopathy  ?-Multifactorial  ICU delirium, infectious etiology, EtOH withdrawal: ?-5/10 resolved . ?-Continue low-dose fentanyl for pain. ?-Limit sedating medication. ? ?  Acute kidney injury secondary to  severe septic shock: ?-Was on IV diuresis, diuretics were held she is positive about 9 L. ? ?Lab Results  ?Component Value Date  ? CREATININE 0.99 11/16/2021  ? CREATININE 1.14 (H) 11/15/2021  ? CREATININE 1.19 (H) 11/14/2021  ? CREATININE 1.19 (H) 11/13/2021  ? CREATININE 1.26 (H) 11/12/2021  ? ?   ?Normocytic anemia: ?-4/22 transfuse 1 unit PRBC ?-5/3 transfuse 1 unit PRBC ?-5/10 anemia panel pending ? ?Severe protein caloric malnutrition/cachexia: ?-Currently on TPN. ?-G-tube placed on 10/22/2021, ?-Speech on board to evaluate. ? ?Oropharyngeal dysphagia: ?-Speech on board to evaluate. ? ?Right proximal humerus fracture: ?-Status post fall in March 2023 seen by Ortho during that month with plan for nonweightbearing for 1 month then follow-up with them as an outpatient. ? ?Hypernatremia//hyponatremia: ?-Slightly low, asymptomatic. ? ?  ?Hypokalemia ?-Potassium goal> 4 ?-5/10 potassium IV 50 mEq ? ?Hypomagnesmia ?- 5/10 Magnesium goal> 2 ?- 5/10 magnesium IV 3 g ?  ?Substance induced mood disorder (HCC)/ Anxiety ? ? ?Pressure Injury 10/27/21 Vertebral column Mid;Medial Deep Tissue Pressure Injury - Purple or maroon localized area of discolored intact skin or blood-filled blister due to damage of underlying soft tissue from pressure and/or shear. (Active)  ?10/27/21 0428  ?Location: Vertebral column  ?Location Orientation: Mid;Medial  ?Staging: Deep Tissue Pressure Injury - Purple or maroon localized area of discolored intact skin or blood-filled blister due to damage of underlying soft tissue from pressure and/or shear.  ?Wound Description (Comments):   ?Present on Admission: No  ?Dressing Type Foam - Lift dressing to assess site every shift 11/16/21 1000  ? ? ?Severe protein calorie malnutrition ?- Continue TPN ?  ? ? ?Mobility Assessment (last 72 hours)   ? ? Mobility Assessment   ? ? Herman Name 11/15/21 1423  ?  ?  ?  ?  ? What is the highest level of mobility based on the progressive mobility assessment? Level 3  (Stands with assist) - Balance while standing  and cannot march in place      ? ?  ?  ? ?  ? ?Goals of care ?- 5/10 palliative care consult: Discuss change of CODE STATUS to DNR, HCPOA, ? ? ?Interdisciplinary Goals of Care Family Meeting ? ? ?Date carried out: 11/16/2021 ? ?Location of the meeting:  ? ?Member's involved:  ? ?Durable Power of Tour manager:    ? ?Discussion: We discussed goals of care for [employer] .   ? ?Code status:  ? ?Disposition:  ? ?Time spent for the meeting:  ? ? ? ?Hazelgrace Bonham, Geraldo Docker, MD ? ?11/16/2021, 8:54 PM ?  ? ?   ?DVT prophylaxis: Subcu heparin ?Code Status: Full ?Family Communication:  ?Status is: Inpatient ? ? ? ?Dispo: The patient is from: SNF ?             Anticipated d/c is to: LTAC ?             Anticipated d/c date is: > 3 days ?             Patient currently is not medically stable to d/c. ? ? ? ? ? ?Consultants:  ?IR ? ? ?Procedures/Significant Events:  ?10/22/21 ex lap, hartman's procedure, g tube placement. Extubated in OR ?4/17 Reintubated overnight with continued pressor support x2. 11 liters positive thus far with progressive AKI this am  ?4/18 Continued issues with electrolyte abnormalities overnight, remains on levo and neo. She is now 14L positive  but urine output has increased  ?4/19 No issues overnight, remains 14L positive but urine output continues to improve. Start to slowly diureses today  ?4/20 weaning NE, extubated, diuresed, started trickle TF, remains on precedex for agitation  ?4/21 periods of agitation/ hallucinations, scheduled ativan helps, remains on precedex 0.7. CT A/P with suspicious for partial small bowel obstruction, with transition point in the lower central pelvis. Increased small to moderate amount of ascites, without definite focal abscess collection. Small amount of postop free air also noted. New small left pleural effusion and left lower lobe atelectasis. New diffuse body wall edema, consistent with anasarca.  Cholelithiasis.  ?4/22 On  TPN.  Did not tolerated TF yesterday -vomitted and now G tube to LIS with bilios returns.  Low grade fever +. WBC now normal. On Zosyn. Hgb < 7g% without overt bleeding - 1 U PRBC. d

## 2021-11-16 NOTE — Progress Notes (Addendum)
Nutrition Follow-up ? ?DOCUMENTATION CODES:  ? ?Non-severe (moderate) malnutrition in context of chronic illness, Underweight ? ?INTERVENTION:  ?- will order Boost Breeze BID via teaspoon only, each supplement provides 250 kcal and 9 grams of protein. ?- TPN per Pharmacist. ?- diet advancement as medically feasible.  ? ? ?NUTRITION DIAGNOSIS:  ? ?Moderate Malnutrition related to chronic illness (alcohol abuse) as evidenced by moderate fat depletion, moderate muscle depletion, severe muscle depletion. -ongoing ? ?GOAL:  ? ?Patient will meet greater than or equal to 90% of their needs -met with TPN regimen ? ?MONITOR:  ? ?PO intake, Supplement acceptance, Diet advancement, Labs, Weight trends, Other (Comment), I & O's (TPN regimen) ? ?ASSESSMENT:  ? ?61 year old female with medical history of tobacco abuse and alcohol use disorder with prior severe withdrawal/DT requiring ICU admission and precede. She presented to the ED due to severe, diffuse abdominal pain that began the night prior and constipation x4 days. She reported frequent alcohol ingestion and last drink was the day PTA. In ED she was found to have pneumoperitoneum and free fluid on CT abdomen/pelvis and severe lactic acidosis. She was started on broad spectrum antibiotics and has been given a total of 5L of crystalloid. Surgery was consulted and she was taken to OR where she underwent ex lap and was found to have perforated sigmoid colon, then had hartman's procedure and insertion of g-tube which was left to gravity drainage, JP bulb in pelvis. She was noted to have feculent peritonitis in the pelvis. ? ?Significant Events:  ?4/15- admission; ex lap with Hartman's procedure with colostomy and insertion of feeding G-tube d/t feculent peritonitis and sigmoid perforation ?4/17- re-intubated; OGT insertion; initial RD assessment ?4/18- OGT dislodgement ?4/19- TPN initiation ?4/20- initiation of TF at 20 ml/hr; extubation ?4/21- large volume emesis; G-tube  clamped and then placed to LIWS; drainage of LLQ fluid collectomy ?4/22- checked vitamins A, B12, C, and D with 0500 labs ?4/28- triple lumen PICC placed in L brachial ?5/1- re-intubation ?5/3- extubation ?5/4- CT abdomen/pelvis for r/o fistula ?5/5- vitamin A and vitamin C supplementation initiated via G-tube ?5/9- CLD via teaspoon only ? ? ?Diet advanced yesterday. Patient laying in bed with no visitors present at the time of RD visit. Patient reports having procedures this AM and being interested in being left to rest for an hour or two. She has ice chips at bedside. She denies having any other liquids this AM.  ? ?SLP was unable to work with patient this AM due to her being out of the room. She went to IR this AM for exchange, up-sizing, and re-positioning of drainage catheter into peri-stomal collection; feculent debris noted.  ? ?She is receiving custom TPN at goal rate of 90 ml/hr which is providing 2060 kcal and 125 grams protein.  ? ?Weight has been stable since 4/27. She is noted to be +8.7 L since 4/26. She had 75 ml stool output via colostomy 5/9. ? ?G-tube remains clamped. CT on 5/4 showed colocutaneous fistula; Eakin's pouch in place. ? ? ?Labs reviewed; CBGs: 130, 125, 122 mg/dl, Na: 129 mmol/l, K: 3.2 mmol/l, BUN: 48 mg/dl, Ca: 8.3 mg/dl, Mg: 1.6 mg/dl, triglycerides on 5/8: 142 mg/dl. ? ?Medications reviewed; 500 mg ascorbic acid BID via G-tube, 2 g IV Mg sulfate x1 run 5/10, 40 mg IV protonix/day, 10 mEq IV KCl x6 runs 5/10, 10000 units vitamin A/day per G-tube.  ? ? ?Diet Order:   ?Diet Order   ? ?       ?  Diet NPO time specified Except for: Sips with Meds, Ice Chips  Diet effective midnight       ?  ?  Diet clear liquid Room service appropriate? Yes; Fluid consistency: Thin  Diet effective now       ?  ? ?  ?  ? ?  ? ? ?EDUCATION NEEDS:  ? ?Not appropriate for education at this time ? ?Skin:  Skin Assessment: Skin Integrity Issues: ?Skin Integrity Issues:: DTI, Incisions ?DTI: vertebral  column ?Incisions: abdomen (4/15 and 4/21) ? ?Last BM:  5/10 (50 ml via colostomy) ? ?Height:  ? ?Ht Readings from Last 1 Encounters:  ?11/08/21 '5\' 10"'  (1.778 m)  ? ? ?Weight:  ? ?Wt Readings from Last 1 Encounters:  ?11/16/21 54.3 kg  ? ? ?BMI:  Body mass index is 17.18 kg/m?. ? ?Estimated Nutritional Needs:  ?Kcal:  2000-2200 kcal ?Protein:  105-125 grams ?Fluid:  >/= 2.2 L/day ? ? ? ? ? ?Jessica Matin, MS, RD, LDN ?Registered Dietitian II ?Inpatient Clinical Nutrition ?RD pager # and on-call/weekend pager # available in [place]  ? ?

## 2021-11-16 NOTE — Consult Note (Signed)
[place] Nurse ostomy follow up ?Stoma type/location: LLQ colostomy.  Just had drain upsized.  New pouch is in place.  Opening is cut too large and skin is exposed.  I gather supplies and offer to change the ostomy pouch but she does not want to do this.   I tell her to notify her nurse if the skin begins to itch or burn around the stoma and explain the stool sitting on the skin can cause irritation and breakdown.  I explain to the bedside RN that I offered to change pouch and patient refused. There is a flat 1 piece pouch and a barrier ring at the supply box in the window seal if needed.   ?Stomal assessment/size: 1 3/4" pink and moist ?Peristomal assessment: exposed skin-pouch opening cut too large.   ?Treatment options for stomal/peristomal skin:  1 piece flat pouch.  Stoma well budded ?Output pouch empty at this time.  Has just returned from IR.   ?Ostomy pouching: 1pc. ?Education provided: Patient lives alone.  We discuss that she will need ostomy teaching to take care of herself prior to discharge.  I encourage her to assist staff in emptying pouch to learn this skill.  She agrees to learn.  She has a friend that is a Marine scientist and lives nearby that can help as well.   ?Enrolled patient in [place] Start Discharge program: Yes ? ?[place] Nurse wound follow up ?Wound type:midline abdominal wound with Eakin fistula pouch in place.  Seal intact.  No odor or issue noted.   Soft brown feculent effluent in pouch. ?Measurement:not assessed  refuses to have pouches changed.  ? ?Dressing procedure/placement/frequency:No pouches are leaking and patient does not want them changed.  No needs at this time. Supplies in box at window including pattern for Eakin pouch.  ?Will follow and anticipate ongoing ostomy teaching as appropriate and patient allows.  ?Domenic Moras MSN, RN, FNP-BC CWON ?Wound, Ostomy, Continence Nurse ?Pager [phone]  ?

## 2021-11-16 NOTE — Progress Notes (Signed)
PHARMACY - TOTAL PARENTERAL NUTRITION CONSULT NOTE  ? ?Indication: Prolonged ileus ? ?Patient Measurements: ?Height: '5\' 10"'$  (177.8 cm) ?Weight: 54.3 kg (119 lb 11.4 oz) ?IBW/kg (Calculated) : 68.5 ?TPN AdjBW (KG): 53.8 ?Body mass index is 17.18 kg/m?. ?Usual Weight: 53.8 kg ? ?Assessment: 61 yo F w/ PMH etoh use disorder, smoker, HTN, GERD.  S/p Exp Lap Hartmans 10/22/2021 for perforated sigmoid colon, Dr. Marcello Moores.   ? ?Glucose / Insulin: no hx DM.  CBGs continue to be <150.  (insulin d/c 5/1) ?Electrolytes: Na remains low at 129 (removed 4/24, resumed Na in TPN 5/8), K down to 3.2 (hyperK 5/8, removed K from TPN on 5/8, goal K >=4), C02 low/improved, mag 1.6 down, Phos 2.9 ?Vitamins:  Vitamin C <0.1 (4/22), receiving 200 mg ascorbic acid daily in TPN from MVI ?Vitamin A 14.2 (4/22), receiving 1 mg of vitamin A daily in TPN from [facility] ?Unable to add additional ascorbic acid and vitamin A to TPN per TPN policy ?5/5 - initiated ascorbic acid 500 mg per tube BID and vitamin A 10,000 units per tube daily.  tube to be clamped x1 hour after administration then returned to suction.  ?Renal: SCr down to 0.99, BUN down 48 ?Hepatic: LFTs, Tbili WNL ?- TG 108, 141, 142 ?Intake / Output;  net I/O +173 mL  ?- UOP down to 900 mL, Drains total down to 60 mL, Stool 75 mL ?- maintenance fluids off ? ?GI Imaging:  ?4/15 CTA: bowel perforation ?4/17 KUB ileus or pSBO ?4/20 CTA: severe ileus or pSBO ? ?GI Surgeries / Procedures:  ?10/22/21 s/p EXPLORATORY LAPAROTOMY hartmans for perforated sigmoid colon ?4/23 IR placed drain LLQ ? ?Central access: CVC TL placed 10/23/21 ?TPN start date: 10/26/21 ? ?Nutritional Goals: ?Goal TPN rate is 90 mL/hr (provides 125 g of protein and ~ 2060 kcals per day)  ? ?RD Assessment: (5/5) ?Estimated Needs ?Total Energy Estimated Needs: 2000-2200 kcal ?Total Protein Estimated Needs: 105-125 grams ?Total Fluid Estimated Needs: >/= 2.2 L/day ? ?Current Nutrition:  ?TPN @ 90 ml/hr  ?NPO except for ice chips ?G-tube  clamped 5/7. Tolerating ice chips/sips. SLP eval and advance to a liquid diet. ? ?Plan:  ?Now: KCL 70mq IV x 6 and magnesium sulfate 2g IV x 1 ?At 18:00:  Continue TPN @  90 mL/hr to provide 100% of goals ?Electrolytes in TPN:  ?Na 75 mEq/L ?K  40 mEq/L ?Ca 5 mEq/L ?Mg 8 mEq/L ?Phos 5 mmol/L ?Cl:Ac max Ac  ?Continue standard MVI and trace elements to TPN ?Continue folic acid & thiamine in TPN ?Monitor TPN labs on Mon/Thurs ?Follow up enteral intake.   ? ? ?JAdrian Saran PharmD, BCPS ?Secure Chat if ?s ?11/16/2021 9:57 AM ? ? ? ? ? ? ?

## 2021-11-16 NOTE — Progress Notes (Signed)
Chaplain received a page that Jessica [name], Jessica [name]'s SO, was in the room and that [name] wanted to assign him as her HCPOA.  Chaplain provided education and assistance with filling out and notarizing AD.  Copies were given to the patient along with the original and a copy was placed in patient chart and sent to [email or web address] so that it can be scanned in to the electronic record. Patient also requested a letter of proof that she has been hospitalized due to not being able to present to court.  Chaplain spoke with unit leadership to assist with this. ? ?[employer], Bcc ?Pager, [phone] ?

## 2021-11-16 NOTE — Progress Notes (Signed)
SLP Cancellation Note ? ?Patient Details ?Name: Jessica [name] ?MRN: [id number] ?DOB: [birth year] ? ? ?Cancelled treatment:        Pt is currently in IR. Will continue efforts.  ? ? ?Houston Siren ?11/16/2021, 9:57 AM ?

## 2021-11-16 NOTE — Procedures (Signed)
Pre procedural Dx: Post op abscess ?Post procedural Dx: Same ? ?Technically successful fluoro guided exchange up-sizing and repositioning of now 14 Fr drainage catheter into peri-stomal collection yielding feculent debris.  ? ?Drain connected to gravity bag.  ? ?EBL: Trace ?Complications: None immediate ? ?Ronny Bacon, MD ?Pager #: [phone] ? ? ?

## 2021-11-17 ENCOUNTER — Inpatient Hospital Stay (HOSPITAL_COMMUNITY): Payer: Self-pay

## 2021-11-17 DIAGNOSIS — J690 Pneumonitis due to inhalation of food and vomit: Secondary | ICD-10-CM

## 2021-11-17 DIAGNOSIS — S42309A Unspecified fracture of shaft of humerus, unspecified arm, initial encounter for closed fracture: Secondary | ICD-10-CM | POA: Diagnosis present

## 2021-11-17 DIAGNOSIS — B961 Klebsiella pneumoniae [K. pneumoniae] as the cause of diseases classified elsewhere: Secondary | ICD-10-CM

## 2021-11-17 DIAGNOSIS — R7881 Bacteremia: Secondary | ICD-10-CM | POA: Diagnosis present

## 2021-11-17 DIAGNOSIS — G9340 Encephalopathy, unspecified: Secondary | ICD-10-CM | POA: Diagnosis present

## 2021-11-17 LAB — GLUCOSE, CAPILLARY
Glucose-Capillary: 136 mg/dL — ABNORMAL HIGH (ref 70–99)
Glucose-Capillary: 122 mg/dL — ABNORMAL HIGH (ref 70–99)
Glucose-Capillary: 122 mg/dL — ABNORMAL HIGH (ref 70–99)

## 2021-11-17 LAB — IRON AND TIBC
Iron: 40 ug/dL (ref 28–170)
Saturation Ratios: 20 % (ref 10.4–31.8)
TIBC: 203 ug/dL — ABNORMAL LOW (ref 250–450)
UIBC: 163 ug/dL

## 2021-11-17 LAB — FERRITIN: Ferritin: 862 ng/mL — ABNORMAL HIGH (ref 11–307)

## 2021-11-17 LAB — MAGNESIUM: Magnesium: 2.1 mg/dL (ref 1.7–2.4)

## 2021-11-17 LAB — FOLATE: Folate: 33.2 ng/mL (ref >5.9)

## 2021-11-17 LAB — COMPREHENSIVE METABOLIC PANEL
ALT: 21 U/L (ref 0–44)
AST: 22 U/L (ref 15–41)
Albumin: 2 g/dL — ABNORMAL LOW (ref 3.5–5.0)
Alkaline Phosphatase: 112 U/L (ref 38–126)
Anion gap: 6 (ref 5–15)
BUN: 42 mg/dL — ABNORMAL HIGH (ref 6–20)
CO2: 22 mmol/L (ref 22–32)
Calcium: 7.9 mg/dL — ABNORMAL LOW (ref 8.9–10.3)
Chloride: 102 mmol/L (ref 98–111)
Creatinine, Ser: 0.8 mg/dL (ref 0.44–1.00)
GFR, Estimated: >60 mL/min (ref >60)
Glucose, Bld: 121 mg/dL — ABNORMAL HIGH (ref 70–99)
Potassium: 4 mmol/L (ref 3.5–5.1)
Sodium: 130 mmol/L — ABNORMAL LOW (ref 135–145)
Total Bilirubin: 0.7 mg/dL (ref 0.3–1.2)
Total Protein: 6.3 g/dL — ABNORMAL LOW (ref 6.5–8.1)

## 2021-11-17 LAB — RETICULOCYTES
Immature Retic Fract: 15.5 % (ref 2.3–15.9)
RBC.: 2.56 MIL/uL — ABNORMAL LOW (ref 3.87–5.11)
Retic Count, Absolute: 76.8 10*3/uL (ref 19.0–186.0)
Retic Ct Pct: 3 % (ref 0.4–3.1)

## 2021-11-17 LAB — PHOSPHORUS: Phosphorus: 2.9 mg/dL (ref 2.5–4.6)

## 2021-11-17 LAB — TRIGLYCERIDES: Triglycerides: 124 mg/dL (ref <150)

## 2021-11-17 LAB — LACTATE DEHYDROGENASE: LDH: 144 U/L (ref 98–192)

## 2021-11-17 LAB — VITAMIN B12: Vitamin B-12: 4281 pg/mL — ABNORMAL HIGH (ref 180–914)

## 2021-11-17 MED ORDER — SODIUM CHLORIDE (PF) 0.9 % IJ SOLN
INTRAMUSCULAR | Status: AC
  Administered 2021-11-17: 5 mL
  Filled 2021-11-17: qty 50

## 2021-11-17 MED ORDER — METOPROLOL TARTRATE 5 MG/5ML IV SOLN
5.0000 mg | Freq: Three times a day (TID) | INTRAVENOUS | Status: DC
  Administered 2021-11-17 – 2021-11-19 (×8): 5 mg via INTRAVENOUS
  Filled 2021-11-17 (×8): qty 5

## 2021-11-17 MED ORDER — IOHEXOL 300 MG/ML  SOLN
75.0000 mL | Freq: Once | INTRAMUSCULAR | Status: AC | PRN
  Administered 2021-11-17: 75 mL via INTRAVENOUS

## 2021-11-17 MED ORDER — TRAVASOL 10 % IV SOLN
INTRAVENOUS | Status: AC
  Filled 2021-11-17: qty 1252.8

## 2021-11-17 MED ORDER — DIPHENHYDRAMINE HCL 25 MG PO CAPS
25.0000 mg | ORAL_CAPSULE | Freq: Once | ORAL | Status: AC | PRN
  Administered 2021-11-17: 25 mg via ORAL
  Filled 2021-11-17: qty 1

## 2021-11-17 NOTE — Progress Notes (Signed)
SLP Cancellation Note ? ?Patient Details ?Name: Jessica [name] ?MRN: [id number] ?DOB: Dec 26, 1960 ? ? ?Cancelled treatment:        Pt currently NPO and note from Dr. Sherral Hammers on 5/10 stated "keep the patient n.p.o.continue NG tube and TNA and antibiotics per surgery." ?Will continue to follow.  ? ? ?Houston Siren ?11/17/2021, 10:35 AM ?

## 2021-11-17 NOTE — Progress Notes (Signed)
? ?                                                                                                                                                     ?                                                   ?Daily Progress Note  ? ?Patient Name: Jessica [name]       Date: 11/17/2021 ?DOB: 1960-08-10  Age: 61 y.o. MRN#: [id number] ?Attending Physician: Allie Bossier, MD ?Primary Care Physician: Deland Pretty, MD ?Admit Date: 10/22/2021 ? ?Reason for Consultation/Follow-up: Establishing goals of care ? ?Subjective: ?Patient is awake and alert, she is resting in her bed, she states that she was initially being given sips of clear liquids however she is now back to only being allowed to have ice chips.  She denies any abdominal pain.  She is awake, alert and oriented.  She asks about what is happening in the intensive care unit with regards to other patients and is without any acute distress currently. ?  ? ?Length of Stay: 26 ? ?Current Medications: ?Scheduled Meds:  ? vitamin C  500 mg Per Tube BID  ? chlorhexidine  15 mL Mouth Rinse BID  ? Chlorhexidine Gluconate Cloth  6 each Topical Daily  ? feeding supplement  1 Container Oral BID BM  ? heparin injection (subcutaneous)  5,000 Units Subcutaneous Q8H  ? ipratropium-albuterol  3 mL Nebulization BID  ? mouth rinse  15 mL Mouth Rinse q12n4p  ? metoprolol tartrate  5 mg Intravenous Q8H  ? nicotine  14 mg Transdermal Daily  ? pantoprazole (PROTONIX) IV  40 mg Intravenous Q24H  ? sodium chloride (PF)      ? sodium chloride flush  10-40 mL Intracatheter Q12H  ? sodium chloride flush  5 mL Intracatheter Q12H  ? sodium chloride flush  5 mL Intracatheter Q8H  ? vitamin A  10,000 Units Per Tube Daily  ? ? ?Continuous Infusions: ? sodium chloride 10 mL/hr at 11/14/21 1900  ? piperacillin-tazobactam (ZOSYN)  IV Stopped (11/17/21 1247)  ? TPN ADULT (ION) 90 mL/hr at 11/17/21 0600  ? TPN ADULT (ION)    ? ? ?PRN Meds: ?acetaminophen (TYLENOL) oral liquid 160 mg/5 mL, acetaminophen,  antiseptic oral rinse, fentaNYL (SUBLIMAZE) injection, hydrALAZINE, lidocaine, lip balm, LORazepam, ondansetron (ZOFRAN) IV, sodium chloride flush ? ?Physical Exam         ?Appears chronically ill and frail ?Awake and alert ?Has ostomy ?Appears with generalized weakness ? ?Vital Signs: BP (!) 155/76   Pulse (!) 102   Temp 99.3 ?F (37.4 ?C) (Axillary)   Resp 15  Ht '5\' 10"'$  (1.778 m)   Wt 54.3 kg   SpO2 99%   BMI 17.18 kg/m?  ?SpO2: SpO2: 99 % ?O2 Device: O2 Device: Room Air ?O2 Flow Rate: O2 Flow Rate (L/min): 2 L/min ? ?Intake/output summary:  ?Intake/Output Summary (Last 24 hours) at 11/17/2021 1327 ?Last data filed at 11/17/2021 0600 ?Gross per 24 hour  ?Intake 2023 ml  ?Output 1380 ml  ?Net 643 ml  ? ? ?LBM: Last BM Date : 11/16/21 ?Baseline Weight: Weight: 54 kg ?Most recent weight: Weight: 54.3 kg ? ?     ?Palliative Assessment/Data: ? ? ? ? ? ?Patient Active Problem List  ? Diagnosis Date Noted  ? Generalized abdominal pain   ? Septic shock ([place]) 11/09/2021  ? Postoperative intra-abdominal abscess 11/09/2021  ? 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 Malnutrition of moderate degree 11/07/2021  ? Palliative care by specialist   ? Goals of care, counseling/discussion   ? General weakness   ? Sinus tachycardia 11/05/2021  ? Normocytic anemia 11/04/2021  ? Acute respiratory failure with hypoxia ([place]) 11/03/2021  ? Acute metabolic encephalopathy 23/30/0762  ? Hypernatremia, hyponatremia, hypokalemia, hyperphosphatemia 11/03/2021  ? Hypokalemia 11/03/2021  ? Hyperphosphatemia 11/03/2021  ? Hyponatremia 11/03/2021  ? Elevated brain natriuretic peptide (BNP) level 11/03/2021  ? Right proximal humeral fracture 11/03/2021  ? Oropharyngeal dysphagia 11/03/2021  ? Colostomy in place [facility]) 10/30/2021  ? Stricture and stenosis of esophagus 10/30/2021  ? Gastrostomy tube in place [facility]) 10/30/2021  ? AKI (acute kidney injury) ([place]) 10/25/2021  ? Anxiety 10/24/2021  ? Chronic pain 10/24/2021  ? Allergic rhinitis 10/24/2021  ?  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 Tobacco use disorder 10/24/2021  ? Underweight 10/24/2021  ? Acquired hammer toe of right foot 10/24/2021  ? Uncontrolled hypertension 10/24/2021  ? Urticaria 10/24/2021  ? Dry mouth 10/24/2021  ? Protein-calorie malnutrition, severe ([place]) 10/24/2021  ? Severe sepsis due to perforated sigmoid colon s/p Hartmann/colostomy 10/22/2021 10/22/2021  ? Substance induced mood disorder ([place])   ? ? ?Palliative Care Assessment & Plan  ? ?Patient Profile: ?  ? ?Assessment: ?62 year old lady with history of alcohol use disorder, tobacco use, recent right proximal humeral fracture, presented to the hospital at this time with diffuse abdominal pain.  Was found to have sepsis sigmoid perforation, was placed on antibiotics and underwent ex lap with Hartman's procedure and G-tube placement on 10-22-2021.  Postop course complicated by patient requiring reintubation and vasopressors on 4-17 and then she was extubated on 4-20.  Periodically has required Precedex due to agitation and hallucinations.  Found to have postop abdominal abscess requiring drainage on 4-22.  Started on TPN due to tube feed intolerance.  Weaned off of vasopressors as well as Precedex.  Hospital course complicated by feculent output from laparotomy site and repeat CT abdomen on 4-28 with new fluid collection within the cul-de-sac.  Remains on IV antibiotics and TPN.  Interventional radiology and general surgery are following.  Palliative medicine team consulted for ongoing goals of care discussions.  At the time of initial consultation patient had endorsed full code/full scope.  She had stated that she had survived her colon bursting inside of her and hence wished to continue with any and all means to get better.  Hospital course complicated by patient having new fluid collection in her abdomen on repeat imaging as well as ongoing weakness.  On 5-10 status post fluoroscopy guided exchange upsizing and repositioning of drainage catheter  into peristomal collection yielding feculent debris. ? ?Recommendations/Plan: ?Continue current  mode of care.  Chart reviewed, SLP following, surgical colleagues following.  Remains admitted to hospital medicine service.  Note plans for repeat CT of the abdomen. ?Her boyfriend [name] [place] has been designated as her HCPOA agent as per her wishes, appreciate spiritual care assistance. ?Monitor hospital course, recommend SNF rehab with palliative.  Goals of care rediscussed with patient today.  She wishes to continue current mode of care.  No further palliative medicine team specific recommendations at this time.  We will follow peripherally. ? ?Goals of Care and Additional Recommendations: ?Limitations on Scope of Treatment: Full Scope Treatment ? ?Code Status: ? ?  ?Code Status Orders  ?(From admission, onward)  ?  ? ? ?  ? ?  Start     Ordered  ? 10/22/21 1429  Full code  Continuous       ? 10/22/21 1431  ? ?  ?  ? ?  ? ?Code Status History   ? ? Date Active Date Inactive Code Status Order ID Comments User Context  ? 12/04/2017 2253 12/12/2017 1930 Full Code [id number]  Reyne Dumas, MD ED  ? ?  ? ? ?Prognosis: ? Guarded  ? ?Discharge Planning: ?Recommend skilled nursing facility for rehab with palliative support. ? ?Care plan was discussed with RN and patient.  ? ?Thank you for allowing the Palliative Medicine Team to assist in the care of this patient. ? ?Loistine Chance, MD ? ?Please contact Palliative Medicine Team phone at [phone] for questions and concerns.  ? ? ? ? ? ?

## 2021-11-17 NOTE — Progress Notes (Signed)
Patients skin assessed, bilateral buttocks are red blanchable, sacral foam replaced. Wound back, foam replaced, wound right os coxae wound, foam in place. Colostomy, Gastrostomy tube, eakons pouch intact and to gravity. ?

## 2021-11-17 NOTE — Progress Notes (Addendum)
On admission to the unit, patient complained of itching she stated"i'll just take some benadryl from my bag". She stated that she has been taking them during this admission when experiencing itching. Pt education provided regarding medication risk. Pt made aware of home medication policy. Pt did not allow bag search. She states that Benadryl is the only medication she had in her possession. Will send medication to pharmacy. Hospitalist Provider updated.  ?

## 2021-11-17 NOTE — Progress Notes (Signed)
Confirmed face-to-face with Clarene Essex NP/[facility] regarding additional 63mq x5 KCL ordered because patient still infusing the KCL ordered in AM. He said hold off and draw the labs at 1am, replacement will be ordered based from the result.  ?

## 2021-11-17 NOTE — Progress Notes (Signed)
?PROGRESS NOTE ? ? ? ?Jessica [name]  OIN:[id number] DOB: 01/05/1961 DOA: 10/22/2021 ?PCP: Deland Pretty, MD  ? ? ? ?Brief Narrative:  ?Jessica [name] is an 61 y.o. female past medical history significant for alcohol abuse, with admissions for severe alcohol withdrawals requiring ICU admissions tobacco use presented to the ED on 10/22/2021 diffuse abdominal pain that started the day prior to admission.  CT of the abdomen pelvis showed pneumoperitoneum and free fluid, severe sepsis with started on broad-spectrum antibiotics fluid resuscitated surgery was consulted was taken emergently to the OR underwent exploratory laparotomy and was found to have a perforated sigmoid colon, status post Hartmann procedure and insertion of the G-tube which was left to drain to gravity, JP in pelvis was noted to have feculent peritonitis in the pelvis.  ICU course was complicated by Klebsiella bacteremia profound caloric protein malnutrition requiring TPN, acute respiratory failure with hypoxia and tachycardia with failure to progress ? ? ?Subjective: ?5/11 afebrile overnight A/O x4.  Patient states pain better controlled today ? ? ?Assessment & Plan: ?Covid vaccination; ?  ?Principal Problem: ?  Severe sepsis due to perforated sigmoid colon s/p Hartmann/colostomy 10/22/2021 ?Active Problems: ?  Acute respiratory failure with hypoxia ([place]) ?  Acute metabolic encephalopathy ?  Oropharyngeal dysphagia ?  Chronic pain ?  Tobacco use disorder ?  Protein-calorie malnutrition, severe ([place]) ?  AKI (acute kidney injury) ([place]) ?  Hypernatremia, hyponatremia, hypokalemia, hyperphosphatemia ?  Elevated brain natriuretic peptide (BNP) level ?  Right proximal humeral fracture ?  Normocytic anemia ?  Substance induced mood disorder ([place]) ?  Anxiety ?  Primary insomnia ?  Underweight ?  Colostomy in place [facility]) ?  Stricture and stenosis of esophagus ?  Gastrostomy tube in place [facility]) ?  Hyponatremia ?  Palliative care by specialist ?  Goals of care,  counseling/discussion ?  General weakness ?  Septic shock ([place]) ?  Postoperative intra-abdominal abscess ?  Colocutaneous fistula ?  Generalized abdominal pain ?  Aspiration pneumonia ([place]) ?  Bacteremia due to Klebsiella pneumoniae ?  Acute encephalopathy ?  Humerus fracture ? ?Severe septic shock due to perforated sigmoid colon  ?-4/15/ 2023 s/p Hartmann/colostomy  ?-10/21/2021 s/p G-tube placement.  Complicated by colocutaneous fistula with Pseudomonas intra-abdominal leak requiring percutaneous drainage: ?-10/29/2021 lower quadrant drain placed: Managed per IR ?Repeated CT scan on 11/10/2031 showed new pelvic collection not amenable to drainage likely an abscess. ?Patient is febrile, with a Tmax of 100.9. ?-CT on 10/10/2021 showed pelvic fluid collection not amenable to drainage.  Lesion abscess. ?Keep the patient n.p.o. continue NG tube and TNA and antibiotics per surgery. ?-Antibiotics were resumed on 11/14/2021. ?-5/10 s/p fluoro guided exchange up-sizing and repositioning of now 14 Fr drainage catheter into peri-stomal collection yielding feculent debris ?-5/11 CT abdomen and pelvis showing continued abscesses/fluid collection.  Will discuss in a.m. with CCS if now amenable to drainage ? ?Septic shock/Aspiration pneumonia/Abdominal infection: ?-Has been weaned off pressors  ?-Strict in and out +10.1 L ?- Daily weight ?Filed Weights  ? 11/14/21 0304 11/15/21 0354 11/16/21 0500  ?Weight: 50.8 kg 53.2 kg 54.3 kg  ?   ? ?Acute respiratory failure with hypoxia/Aspiration pneumonia: ?-Extubated on 10/27/2021 due to altered mental status had to be reintubated on 11/07/2021. ?-Extubated on 11/08/2021. ?-Currently n.p.o. ,now on room air. ?-Physical therapy is on board. Will need SNF vs LTAC. ?   ?Klebsiella bacteremia ?-Blood cultures on 10/23/2018 ?-Initially placed on vancomycin Flagyl and cefepime on 10/22/2021 ?-De-escalated to IV Zosyn  10/24/2021, she has now completed 2-week course of IV antibiotics for bacteremia. ?-Continue  IV Zosyn per surgery for intra-abdominal infection ?-5/11 given patient's complicated multiple infections will consult ID in a.m. ? ? ?Acute encephalopathy  ?-Multifactorial  ICU delirium, infectious etiology, EtOH withdrawal: ?-5/10 resolved . ?-Continue low-dose fentanyl for pain. ?-Limit sedating medication. ?-5/11 resolved ? ?Acute kidney injury secondary to severe septic shock: ?-Was on IV diuresis, diuretics were held she is positive about 9 L. ? ?Lab Results  ?Component Value Date  ? CREATININE 0.80 11/17/2021  ? CREATININE 0.99 11/16/2021  ? CREATININE 1.14 (H) 11/15/2021  ? CREATININE 1.19 (H) 11/14/2021  ? CREATININE 1.19 (H) 11/13/2021  ?-5/11 resolved ? ?Essential HTN ?- 5/11 Metoprolol IV 5 mg TID ?   ?Normocytic anemia: ?-4/22 transfuse 1 unit PRBC ?-5/3 transfuse 1 unit PRBC ?-5/10 anemia panel pending ? ?Severe protein caloric malnutrition/cachexia: ?-Currently on TPN. ?-G-tube placed on 10/22/2021, ?-Speech on board to evaluate. ? ?Oropharyngeal dysphagia: ?-5/11 Speech concurs with continued n.p.o., TPN and antibiotics per surgery.   ? ?Right proximal humerus fracture: ?-Status post fall in March 2023 seen by Ortho during that month with plan for nonweightbearing for 1 month then follow-up with them as an outpatient. ? ?Hypernatremia//hyponatremia: ?Lab Results  ?Component Value Date  ? NA 130 (L) 11/17/2021  ? NA 129 (L) 11/16/2021  ? NA 133 (L) 11/15/2021  ? NA 133 (L) 11/14/2021  ? NA 136 11/13/2021  ?-Slightly low, asymptomatic. ? ?  ?Hypokalemia ?-Potassium goal> 4 ?-5/10 potassium IV 50 mEq ? ?Hypomagnesmia ?- 5/10 Magnesium goal> 2 ?- 5/10 magnesium IV 3 g ?  ?Substance induced mood disorder (HCC)/ Anxiety ? ? ?Pressure Injury 10/27/21 Vertebral column Mid;Medial Deep Tissue Pressure Injury - Purple or maroon localized area of discolored intact skin or blood-filled blister due to damage of underlying soft tissue from pressure and/or shear. (Active)  ?10/27/21 0428  ?Location: Vertebral column   ?Location Orientation: Mid;Medial  ?Staging: Deep Tissue Pressure Injury - Purple or maroon localized area of discolored intact skin or blood-filled blister due to damage of underlying soft tissue from pressure and/or shear.  ?Wound Description (Comments):   ?Present on Admission: No  ?Dressing Type Foam - Lift dressing to assess site every shift 11/17/21 0928  ?-Continue treatment per wound care ? ?Severe protein calorie malnutrition ?- Continue TPN ?  ? ? ?Mobility Assessment (last 72 hours)   ? ? Mobility Assessment   ? ? [place] Name 11/15/21 1423  ?  ?  ?  ?  ? What is the highest level of mobility based on the progressive mobility assessment? Level 3 (Stands with assist) - Balance while standing  and cannot march in place      ? ?  ?  ? ?  ? ?Goals of care ?- 5/10 palliative care consult: Discuss change of CODE STATUS to DNR, HCPOA, ? ? ?Interdisciplinary Goals of Care Family Meeting ? ? ?Date carried out: 11/17/2021 ? ?Location of the meeting: ICU ? ?Member's involved: Dr.Anwar, patient ? ?Durable Power of Tour manager: Boyfriend [name] [place] HCPOA ? ?Discussion: We discussed goals of care for [employer] .   ? ?Code status: Full code ? ?Disposition: recommend SNF rehab with palliative ? ?Time spent for the meeting: 30 minutes ? ? ? ?Aikam Hellickson, Geraldo Docker, MD ? ?11/17/2021, 5:07 PM ?  ? ?   ?DVT prophylaxis: Subcu heparin ?Code Status: Full ?Family Communication:  ?Status is: Inpatient ? ? ? ?Dispo:  The patient is from: SNF ?             Anticipated d/c is to: LTAC ?             Anticipated d/c date is: > 3 days ?             Patient currently is not medically stable to d/c. ? ? ? ? ? ?Consultants:  ?IR ?Palliative care ?CCS ? ? ?Procedures/Significant Events:  ?10/22/21 ex lap, hartman's procedure, g tube placement. Extubated in OR ?4/17 Reintubated overnight with continued pressor support x2. 11 liters positive thus far with progressive AKI this am  ?4/18 Continued issues with  electrolyte abnormalities overnight, remains on levo and neo. She is now 14L positive but urine output has increased  ?4/19 No issues overnight, remains 14L positive but urine output continues to improve. [place]

## 2021-11-17 NOTE — Progress Notes (Signed)
? ? ?Assessment & Plan: ?POD#26 - PERFORATED SIGMOID COLON - status post EX LAP, HARTMAN'S PROCEDURE, Gastrostomy tube placement - 7/94/8016 Dr. Leighton Ruff ?OR FINDINGS: Purulent ascites throughout the abdomen and stool contamination in the pelvis due to necrotic perforated sigmoid colon ?- Status post drainage of LLQ fluid collection by IR - 4/22, Cx with pseudomonas; drain adjusted and upsized 5/10 ?- Midline wound with colocutaneous fistula. Continue Eakins pouch. WOCN following for colostomy and pouching of fistula ?- CT 5/4 also with pelvic fluid collection measuring 6 x 3.2 cm. Discussed with IR and not felt amenable/safe for drainage. IV abx. Repeat CT today to re-assess collection. ?-  G-tube clamped 5/7. Patient tolerating. Tolerating ice chips/sips. SLP following, currently sips of clears with tsp ? ?FEN - G-tube capped, TPN, CLD per tsp per SLP ?VTE - SCDs, SQH ?ID - zosyn 4/17 - 4/27; 4/28-5/6, 5/8 >> ?  ?IV abx. Repeat CT today to re-evaluate pelvic fluid collection and see if it is now amenable to drainage. Advance diet per SLP and monitor drain/fistula output. ? ?      Obie Dredge, PA-C ?[facility] ?A [facility] practice ?Office: [phone] ?       ?Chief Complaint: ?Perforated colon ? ?Subjective: ?Patient in bed, resting. agrees to let me examine her.  ? ?AFVSS ? ?Objective: ?Vital signs in last 24 hours: ?Temp:  [97.3 ?F (36.3 ?C)-99.7 ?F (37.6 ?C)] 97.7 ?F (36.5 ?C) (05/11 0800) ?Pulse Rate:  [91-109] 102 (05/11 0600) ?Resp:  [11-28] 15 (05/11 0600) ?BP: (116-183)/(63-115) 155/76 (05/11 0600) ?SpO2:  [97 %-100 %] 99 % (05/11 0928) ?Last BM Date : 11/16/21 ? ?Intake/Output from previous day: ?05/10 0701 - 05/11 0700 ?In: 2706.5 [I.V.:2169.9; IV Piggyback:536.6] ?Out: 1380 [Urine:1200; Drains:30; Stool:150] ?Intake/Output this shift: ?No intake/output data recorded. ? ?Physical Exam: ?HEENT - sclerae clear, mucous membranes moist ?Neck - soft ?Abdomen - soft, overall  nontender ? Midline wound/[facility] - eakins pouch in place with feculent drainage ? LLQ ostomy - stoma viable, liquid stoll in pouch ? JP - tan drainage  ? G tube - capped ?Ext - no edema, non-tender ? ?Lab Results:  ?No results for input(s): WBC, HGB, HCT, PLT in the last 72 hours. ? ?BMET ?Recent Labs  ?  11/16/21 ?8675 11/17/21 ?0200  ?NA 129* 130*  ?K 3.2* 4.0  ?CL 101 102  ?CO2 20* 22  ?GLUCOSE 123* 121*  ?BUN 48* 42*  ?CREATININE 0.99 0.80  ?CALCIUM 8.3* 7.9*  ? ?PT/INR ?No results for input(s): LABPROT, INR in the last 72 hours. ?Comprehensive Metabolic Panel: ?   ?Component Value Date/Time  ? NA 130 (L) 11/17/2021 0200  ? NA 129 (L) 11/16/2021 4492  ? K 4.0 11/17/2021 0200  ? K 3.2 (L) 11/16/2021 0100  ? CL 102 11/17/2021 0200  ? CL 101 11/16/2021 0508  ? CO2 22 11/17/2021 0200  ? CO2 20 (L) 11/16/2021 0508  ? BUN 42 (H) 11/17/2021 0200  ? BUN 48 (H) 11/16/2021 0508  ? CREATININE 0.80 11/17/2021 0200  ? CREATININE 0.99 11/16/2021 0508  ? GLUCOSE 121 (H) 11/17/2021 0200  ? GLUCOSE 123 (H) 11/16/2021 7121  ? CALCIUM 7.9 (L) 11/17/2021 0200  ? CALCIUM 8.3 (L) 11/16/2021 0508  ? AST 22 11/17/2021 0200  ? AST 27 11/14/2021 0430  ? ALT 21 11/17/2021 0200  ? ALT 22 11/14/2021 0430  ? ALKPHOS 112 11/17/2021 0200  ? ALKPHOS 129 (H) 11/14/2021 0430  ? BILITOT 0.7 11/17/2021 0200  ? BILITOT 0.9  11/14/2021 0430  ? PROT 6.3 (L) 11/17/2021 0200  ? PROT 7.0 11/14/2021 0430  ? ALBUMIN 2.0 (L) 11/17/2021 0200  ? ALBUMIN 2.2 (L) 11/14/2021 0430  ? ? ?Studies/Results: ?IR Catheter Tube Change ? ?Result Date: 11/16/2021 ?CLINICAL DATA:  History of colonic resection, post percutaneous drainage catheter placement 10/29/2021. EXAM: IR CATHETER TUBE CHANGE COMPARISON:  CT abdomen pelvis-11/10/2021; 11/04/2021; 10/28/2021 CT-guided percutaneous catheter placement-10/29/2021 CONTRAST:  10 cc Omnipaque 300-administered via the percutaneous drainage catheter. MEDICATIONS: None. ANESTHESIA/SEDATION: None FLUOROSCOPY TIME:  1 minute, 30  seconds (3 mGy) TECHNIQUE: Patient was positioned supine on the fluoroscopy table. The external portion of the existing percutaneous drainage catheter as well as the surrounding skin was prepped and draped in usual sterile fashion. A preprocedural spot fluoroscopic image was obtained of the existing percutaneous drainage catheter. A small amount of contrast was injected via the existing percutaneous drainage catheter and several fluoroscopic images were obtained in various obliquities. The external portion of the percutaneous drainage catheter was cut and cannulated with a short Amplatz wire. Under intermittent fluoroscopic guidance, the existing percutaneous drainage catheter was exchanged for a new slightly larger now 76 [place] percutaneous drainage catheter which was retracted in more ideally positioned with end coiled and locked within the residual abscess cavity. Contrast injection confirmed appropriate position functionality of the percutaneous drainage catheter. The percutaneous drainage catheter was connected to a gravity bag and secured in place within interrupted suture and a StatLock device. A dressing was applied. The patient tolerated the procedure well without immediate postprocedural complication. FINDINGS: Preprocedural spot fluoroscopic image demonstrates stable positioning of left lower abdominal percutaneous drainage catheter Contrast injection demonstrates opacification of the residual abscess cavity which appears to demonstrate continues fistulous connection to the open midline abdominal wound. There is no definitive communication between the ventral abdominal/pelvic abscess and the known collection with the pelvic cul-de-sac. After fluoroscopic guided exchange, repositioning and up sizing, the new slightly larger now 14 French drainage catheter is more ideally positioned within the residual abscess cavity. IMPRESSION: 1. Successful fluoroscopic guided exchange, repositioning and up  sizing of now 14 [place] all-purpose drainage catheter with end now more ideally positioned within the dominant component of the residual abscess. 2. 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 3. Residual abscess cavity does not appear to communicate with known collection within the pelvic cul-de-sac. PLAN: - As the existing drainage catheter does not appear to communicate with the known pelvic fluid collection, would recommend CT scan of the abdomen pelvis for further evaluation as indicated. Above discussed with surgical PA, Obelia Bonello, at the time procedure completion. Electronically Signed   By: Sandi Mariscal M.D.   On: 11/16/2021 11:23   ? ? ? ?[place] ?11/17/2021 ? ?  ?

## 2021-11-17 NOTE — Progress Notes (Signed)
PHARMACY - TOTAL PARENTERAL NUTRITION CONSULT NOTE  ? ?Indication: Prolonged ileus ? ?Patient Measurements: ?Height: '5\' 10"'$  (177.8 cm) ?Weight: 54.3 kg (119 lb 11.4 oz) ?IBW/kg (Calculated) : 68.5 ?TPN AdjBW (KG): 53.8 ?Body mass index is 17.18 kg/m?. ?Usual Weight: 53.8 kg ? ?Assessment: 61 yo F w/ PMH etoh use disorder, smoker, HTN, GERD.  S/p Exp Lap Hartmans 10/22/2021 for perforated sigmoid colon, Dr. Marcello Moores.   ? ?Glucose / Insulin: no hx DM.  CBGs continue to be <150.  (insulin d/c 5/1) ?Electrolytes: Na remains low at 129 (removed 4/24, resumed Na in TPN 5/8), K down to 3.2 (hyperK 5/8, removed K from TPN on 5/8, goal K >=4), C02 low/improved, mag 1.6 down, Phos 2.9 ?Vitamins:  Vitamin C <0.1 (4/22), receiving 200 mg ascorbic acid daily in TPN from MVI ?Vitamin A 14.2 (4/22), receiving 1 mg of vitamin A daily in TPN from [facility] ?Unable to add additional ascorbic acid and vitamin A to TPN per TPN policy ?5/5 - initiated ascorbic acid 500 mg per tube BID and vitamin A 10,000 units per tube daily.  tube to be clamped x1 hour after administration then returned to suction.  ?Renal: SCr down to 0.99, BUN down 48 ?Hepatic: LFTs, Tbili WNL ?- TG 108, 141, 142 ?Intake / Output;  net I/O +6.8 L for admission ? ? ?GI Imaging:  ?4/15 CTA: bowel perforation ?4/17 KUB ileus or pSBO ?4/20 CTA: severe ileus or pSBO ?5/04 CT: new pelvic collection not amenable to drainage, likely an abscess ? ?GI Surgeries / Procedures:  ?10/22/21 s/p EXPLORATORY LAPAROTOMY hartmans for perforated sigmoid colon ?4/23 IR placed drain LLQ ?5/10 Drainage cath exchanged ? ?Central access: CVC TL placed 10/23/21 ?TPN start date: 10/26/21 ? ?Nutritional Goals: ?Goal TPN rate is 90 mL/hr (provides 125 g of protein and ~ 2060 kcals per day)  ? ?RD Assessment: (5/10) ?Estimated Needs ?Total Energy Estimated Needs: 2000-2200 kcal ?Total Protein Estimated Needs: 105-125 grams ?Total Fluid Estimated Needs: >/= 2.2 L/day ? ?Current Nutrition:  ?TPN ?NPO except  for ice chips ?G-tube clamped 5/7. Tolerating ice chips/sips. SLP following ? ?Plan:  ?Now: KCL 28mq IV x 6 and magnesium sulfate 2g IV x 1 ?At 18:00:  Continue TPN @  90 mL/hr to provide 100% of goals ?Electrolytes in TPN:  ?Na 75 mEq/L ?K  40 mEq/L ?Ca 5 mEq/L ?Mg 8 mEq/L ?Phos 5 mmol/L ?Cl:Ac 1:1 ?Continue standard MVI and trace elements to TPN ?Continue folic acid & thiamine in TPN ?Monitor TPN labs on Mon/Thurs ?Follow up enteral intake for appropriate wean off TPN   ? ? ?ATawnya Crook PharmD, BCPS ?Clinical Pharmacist ?11/17/2021 11:35 AM ? ? ? ? ? ? ? ?

## 2021-11-18 LAB — CBC WITH DIFFERENTIAL/PLATELET
Abs Immature Granulocytes: 0.03 10*3/uL (ref 0.00–0.07)
Basophils Absolute: 0 10*3/uL (ref 0.0–0.1)
Basophils Relative: 0 %
Eosinophils Absolute: 0.3 10*3/uL (ref 0.0–0.5)
Eosinophils Relative: 4 %
HCT: 23.8 % — ABNORMAL LOW (ref 36.0–46.0)
Hemoglobin: 7.8 g/dL — ABNORMAL LOW (ref 12.0–15.0)
Immature Granulocytes: 0 %
Lymphocytes Relative: 26 %
Lymphs Abs: 1.9 10*3/uL (ref 0.7–4.0)
MCH: 29.1 pg (ref 26.0–34.0)
MCHC: 32.8 g/dL (ref 30.0–36.0)
MCV: 88.8 fL (ref 80.0–100.0)
Monocytes Absolute: 0.5 10*3/uL (ref 0.1–1.0)
Monocytes Relative: 6 %
Neutro Abs: 4.7 10*3/uL (ref 1.7–7.7)
Neutrophils Relative %: 64 %
Platelets: 275 10*3/uL (ref 150–400)
RBC: 2.68 MIL/uL — ABNORMAL LOW (ref 3.87–5.11)
RDW: 16.1 % — ABNORMAL HIGH (ref 11.5–15.5)
WBC: 7.4 10*3/uL (ref 4.0–10.5)
nRBC: 0 % (ref 0.0–0.2)

## 2021-11-18 LAB — COMPREHENSIVE METABOLIC PANEL
ALT: 27 U/L (ref 0–44)
AST: 24 U/L (ref 15–41)
Albumin: 2.1 g/dL — ABNORMAL LOW (ref 3.5–5.0)
Alkaline Phosphatase: 114 U/L (ref 38–126)
Anion gap: 7 (ref 5–15)
BUN: 39 mg/dL — ABNORMAL HIGH (ref 6–20)
CO2: 21 mmol/L — ABNORMAL LOW (ref 22–32)
Calcium: 7.9 mg/dL — ABNORMAL LOW (ref 8.9–10.3)
Chloride: 101 mmol/L (ref 98–111)
Creatinine, Ser: 0.9 mg/dL (ref 0.44–1.00)
GFR, Estimated: >60 mL/min (ref >60)
Glucose, Bld: 114 mg/dL — ABNORMAL HIGH (ref 70–99)
Potassium: 3.6 mmol/L (ref 3.5–5.1)
Sodium: 129 mmol/L — ABNORMAL LOW (ref 135–145)
Total Bilirubin: 0.6 mg/dL (ref 0.3–1.2)
Total Protein: 6.4 g/dL — ABNORMAL LOW (ref 6.5–8.1)

## 2021-11-18 LAB — GLUCOSE, CAPILLARY
Glucose-Capillary: 116 mg/dL — ABNORMAL HIGH (ref 70–99)
Glucose-Capillary: 118 mg/dL — ABNORMAL HIGH (ref 70–99)
Glucose-Capillary: 122 mg/dL — ABNORMAL HIGH (ref 70–99)
Glucose-Capillary: 129 mg/dL — ABNORMAL HIGH (ref 70–99)

## 2021-11-18 LAB — MAGNESIUM: Magnesium: 1.9 mg/dL (ref 1.7–2.4)

## 2021-11-18 LAB — HAPTOGLOBIN: Haptoglobin: 270 mg/dL (ref 33–346)

## 2021-11-18 LAB — PHOSPHORUS: Phosphorus: 3.4 mg/dL (ref 2.5–4.6)

## 2021-11-18 MED ORDER — TRAVASOL 10 % IV SOLN
INTRAVENOUS | Status: AC
  Filled 2021-11-18: qty 1252.8

## 2021-11-18 MED ORDER — IPRATROPIUM-ALBUTEROL 0.5-2.5 (3) MG/3ML IN SOLN: 3.0000 mL | RESPIRATORY_TRACT | Status: DC | PRN

## 2021-11-18 NOTE — Progress Notes (Signed)
? ? ?Referring Physician(s): ?Gross,S ? ?Supervising Physician: Ruthann Cancer ? ?Patient Status:  [facility] - In-pt ? ?Chief Complaint: ?Abdominal abscess ? ? ?Subjective: ?Pt much more alert/talkative than when in ICU; states she is hungry and has "the shakes"; denies worsening abd pain,N/V ? ? ?Allergies: ?Patient has no known allergies. ? ?Medications: ?Prior to Admission medications   ?Medication Sig Start Date End Date Taking? Authorizing Provider  ?fluticasone (FLONASE) 50 MCG/ACT nasal spray Place 1-2 sprays into both nostrils daily as needed for allergies or rhinitis.   Yes [provider]  ?loratadine (CLARITIN) 10 MG tablet Take 10 mg by mouth daily as needed for allergies.   Yes [provider]  ?Multiple Vitamins-Minerals (MULTIVITAMIN ADULTS 50+) TABS Take 1 tablet by mouth every morning.   Yes [provider]  ?ondansetron (ZOFRAN) 8 MG tablet Take 8 mg by mouth daily as needed for nausea/vomiting. 10/10/21  Yes [provider]  ?cetirizine (ZYRTEC) 10 MG tablet Take 10 mg by mouth daily as needed for allergies.    [provider]  ?diclofenac Sodium (VOLTAREN) 1 % GEL Apply 4 g topically 4 (four) times daily. ?Patient not taking: Reported on 10/22/2021 09/21/21   Deno Etienne, DO  ?mirtazapine (REMERON SOL-TAB) 15 MG disintegrating tablet Take 1 tablet (15 mg total) by mouth at bedtime. 12/12/17 01/11/18  Arrien, Jimmy Picket, MD  ?morphine (MSIR) 15 MG tablet Take 0.5 tablets (7.5 mg total) by mouth every 4 (four) hours as needed for severe pain. ?Patient not taking: Reported on 10/22/2021 09/26/21   Vanetta Mulders, MD  ? ? ? ?Vital Signs: ?BP (!) 149/80 (BP Location: Right Arm)   Pulse (!) 110   Temp 98.4 ?F (36.9 ?C) (Oral)   Resp 20   Ht '5\' 10"'$  (1.778 m)   Wt 121 lb 14.6 oz (55.3 kg)   SpO2 99%   BMI 17.49 kg/m?  ? ?Physical Exam awake/alert; LLQ drain intact, insertion site okay , minimal tenderness, feculent fluid in drain bag ? ?Imaging: ?CT ABDOMEN PELVIS  W CONTRAST ? ?Result Date: 11/17/2021 ?CLINICAL DATA:  Intraop abscess EXAM: CT ABDOMEN AND PELVIS WITH CONTRAST TECHNIQUE: Multidetector CT imaging of the abdomen and pelvis was performed using the standard protocol following bolus administration of intravenous contrast. RADIATION DOSE REDUCTION: This exam was performed according to the departmental dose-optimization program which includes automated exposure control, adjustment of the mA and/or kV according to patient size and/or use of iterative reconstruction technique. CONTRAST:  41m OMNIPAQUE IOHEXOL 300 MG/ML  SOLN COMPARISON:  CT abdomen and pelvis dated Nov 10, 2021 FINDINGS: Lower chest: Small left pleural effusion with atelectasis. Linear opacity of the lingula with associated traction bronchiectasis, likely sequela of prior infection. Hepatobiliary: No focal liver abnormality is seen. Cholelithiasis. Unchanged gallbladder mucosal enhancement with no evidence of gallbladder distension. No biliary ductal dilation. Pancreas: Unremarkable. No pancreatic ductal dilatation or surrounding inflammatory changes. Spleen: Normal in size without focal abnormality. Adrenals/Urinary Tract: Adrenal glands are unremarkable. Kidneys are normal, without renal calculi, focal lesion, or hydronephrosis. Mild wall thickening of the urinary bladder which is likely reactive. Stomach/Bowel: Gastrostomy tube is seen in the body of the stomach. 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 Numerous matted loops of small bowel seen in the pelvis with adjacent rim enhancing pelvic fluid collection measuring 6.5 x 3.1 cm on series 3, image 64, unchanged in size when compared with prior exam. More anteriorly located fluid collection which contains a pigtail catheter located on series 3, image 87 unchanged  in size when compared with prior exam, measuring approximately 0.8 x 1.6 cm, unchanged when compared with prior exam and remeasured in similar plane. Small collection along the  left pelvic sidewall measures up to 2.5 cm on series 3, image 48, previously 2.5 cm. Vascular/Lymphatic: Aortic atherosclerosis. No enlarged abdominal or pelvic lymph nodes. Reproductive: Prior hysterectomy. Other: No ascites or pneumoperitoneum. Stable appearance of midline wound, compatible with history of colocutaneous fistula. Musculoskeletal: Unchanged severe compression deformity of L1. Moderate to severe degenerative disc disease at L3-L4. No aggressive appearing osseous lesions. IMPRESSION: 1. Rim enhancing fluid collections of the pelvis are unchanged in size when compared with prior exam, more anteriorly located collection contains a pigtail drainage catheter. 2. 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 Numerous matted and thick-walled loops of large and small bowel small bowel seen in the pelvis, similar to prior exam. 3. Small left pleural effusion. 4. Cholelithiasis. Electronically Signed   By: Yetta Glassman M.D.   On: 11/17/2021 12:38  ? ?IR Catheter Tube Change ? ?Result Date: 11/16/2021 ?CLINICAL DATA:  History of colonic resection, post percutaneous drainage catheter placement 10/29/2021. EXAM: IR CATHETER TUBE CHANGE COMPARISON:  CT abdomen pelvis-11/10/2021; 11/04/2021; 10/28/2021 CT-guided percutaneous catheter placement-10/29/2021 CONTRAST:  10 cc Omnipaque 300-administered via the percutaneous drainage catheter. MEDICATIONS: None. ANESTHESIA/SEDATION: None FLUOROSCOPY TIME:  1 minute, 30 seconds (3 mGy) TECHNIQUE: Patient was positioned supine on the fluoroscopy table. The external portion of the existing percutaneous drainage catheter as well as the surrounding skin was prepped and draped in usual sterile fashion. A preprocedural spot fluoroscopic image was obtained of the existing percutaneous drainage catheter. A small amount of contrast was injected via the existing percutaneous drainage catheter and several fluoroscopic images were obtained in various obliquities. The external  portion of the percutaneous drainage catheter was cut and cannulated with a short Amplatz wire. Under intermittent fluoroscopic guidance, the existing percutaneous drainage catheter was exchanged for a new slightly larger now 28 [place] percutaneous drainage catheter which was retracted in more ideally positioned with end coiled and locked within the residual abscess cavity. Contrast injection confirmed appropriate position functionality of the percutaneous drainage catheter. The percutaneous drainage catheter was connected to a gravity bag and secured in place within interrupted suture and a StatLock device. A dressing was applied. The patient tolerated the procedure well without immediate postprocedural complication. FINDINGS: Preprocedural spot fluoroscopic image demonstrates stable positioning of left lower abdominal percutaneous drainage catheter Contrast injection demonstrates opacification of the residual abscess cavity which appears to demonstrate continues fistulous connection to the open midline abdominal wound. There is no definitive communication between the ventral abdominal/pelvic abscess and the known collection with the pelvic cul-de-sac. After fluoroscopic guided exchange, repositioning and up sizing, the new slightly larger now 14 French drainage catheter is more ideally positioned within the residual abscess cavity. IMPRESSION: 1. Successful fluoroscopic guided exchange, repositioning and up sizing of now 14 [place] all-purpose drainage catheter with end now more ideally positioned within the dominant component of the residual abscess. 2. 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 3. Residual abscess cavity does not appear to communicate with known collection within the pelvic cul-de-sac. PLAN: - As the existing drainage catheter does not appear to communicate with the known pelvic fluid collection, would recommend CT scan of the  abdomen pelvis for further evaluation as indicated. Above discussed with surgical PA, [place], at the time procedure completion. Electronically Signed   By: Eldridge Abrahams.D.  On: 11/16/2021 11:23   ? ?Labs: ? ?CB

## 2021-11-18 NOTE — Progress Notes (Signed)
Physical Therapy Treatment ?Patient Details ?Name: Jessica [name] ?MRN: [id number] ?DOB: 03-22-1961 ?Today's Date: 11/18/2021 ? ? 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  Pt intubated post op and extubated 10/27/21. Re Intubated 11/07/21.   Pt with hx of ETOH abuse and recent R shoulder fx (09/20/21) with X ray from 10/27/21 indicating fx still present. ? ?  ?PT Comments  ? ? Patient asking from ice chips frequently. Patient demonstrates improved  mobility, able to  sit at bed edge and maintain balance. Mod assist to rise  and stand at RW x 2 then step to recliner.  HR 130. Patient  with noted tremors and rash on body. ?Continue PT for increased mobility.   ?Recommendations for follow up therapy are one component of a multi-disciplinary discharge planning process, led by the attending physician.  Recommendations may be updated based on patient status, additional functional criteria and insurance authorization. ? ?Follow Up Recommendations ? Skilled nursing-short term rehab (<3 hours/day) ?  ?  ?Assistance Recommended at Discharge Frequent or constant Supervision/Assistance  ?Patient can return home with the following Two people to help with walking and/or transfers;A lot of help with bathing/dressing/bathroom;Assistance with cooking/housework;Assist for transportation;Help with stairs or ramp for entrance ?  ?Equipment Recommendations ? None recommended by PT  ?  ?Recommendations for Other Services   ? ? ?  ?Precautions / Restrictions Precautions ?Precautions: Fall ?Precaution Comments: multiple lines--> L JP drain, gastrostomy tube, triple lumen central line, colostomy ?Restrictions ?Other Position/Activity Restrictions: recent R UE/shoulder fx - assume NWB, though per chart review RUE WB status expired on 10/27/21.  ?  ? ?Mobility ? Bed Mobility ?  ?Bed Mobility: Rolling ?Rolling: Supervision ?Sidelying to sit: Mod assist, HOB  elevated ?  ?  ?  ?General bed mobility comments: patient able to push self  up to sitting with min assist. ?  ? ?Transfers ?Overall transfer level: Needs assistance ?Equipment used: Rollator (4 wheels) ?Transfers: Sit to/from Stand, Bed to chair/wheelchair/BSC ?Sit to Stand: Mod assist ?  ?Step pivot transfers: Mod assist ?  ?  ?  ?General transfer comment: assist to power up and to steady, pt able to take a few pivotal steps to recliner, BLEs tremulous in weight bearing ?  ? ?Ambulation/Gait ?  ?  ?  ?  ?  ?  ?  ?  ? ? ?Stairs ?  ?  ?  ?  ?  ? ? ?Wheelchair Mobility ?  ? ?Modified Rankin (Stroke Patients Only) ?  ? ? ?  ?Balance   ?Sitting-balance support: No upper extremity supported, Feet supported ?Sitting balance-Leahy Scale: Fair ?Sitting balance - Comments: able to maintain trunk upright with single UE support ?  ?Standing balance support: Bilateral upper extremity supported, During functional activity, Reliant on assistive device for balance ?Standing balance-Leahy Scale: Poor ?Standing balance comment: able to stand briefly , then step to recliner ?  ?  ?  ?  ?  ?  ?  ?  ?  ?  ?  ?  ? ?  ?Cognition Arousal/Alertness: Awake/alert ?Behavior During Therapy: [facility] for tasks assessed/performed ?Overall Cognitive Status: Impaired/Different from baseline ?Area of Impairment: Safety/judgement, Awareness ?  ?  ?  ?  ?  ?  ?  ?  ?  ?  ?  ?Following Commands: Follows one step commands consistently, Follows multi-step commands with increased time ?  ?  ?Problem Solving: Slow processing ?General Comments:  patient  asking for ice chips frequently, patient asked" If i want therapy can I ask for it.? " speach is improved, still rapid but much more understandable , able to express self. ?  ?  ? ?  ?Exercises   ? ?  ?General Comments   ?  ?  ? ?Pertinent Vitals/Pain Pain Assessment ?Faces Pain Scale: Hurts little more ?Pain Location: back ?Pain Descriptors / Indicators: Discomfort ?Pain Intervention(s): Monitored during  session  ? ? ?Home Living   ?  ?  ?  ?  ?  ?  ?  ?  ?  ?   ?  ?Prior Function    ?  ?  ?   ? ?PT Goals (current goals can now be found in the care plan section) Acute Rehab PT Goals ?Patient Stated Goal: to get my back stronger ?PT Goal Formulation: With patient ?Time For Goal Achievement: 12/02/21 ?Potential to Achieve Goals: Fair ?Progress towards PT goals: Progressing toward goals ? ?  ?Frequency ? ? ? Min 2X/week ? ? ? ?  ?PT Plan Current plan remains appropriate  ? ? ?Co-evaluation   ?  ?  ?  ?  ? ?  ?AM-PAC PT "6 Clicks" Mobility   ?Outcome Measure ? Help needed turning from your back to your side while in a flat bed without using bedrails?: A Little ?Help needed moving from lying on your back to sitting on the side of a flat bed without using bedrails?: A Lot ?Help needed moving to and from a bed to a chair (including a wheelchair)?: A Lot ?Help needed standing up from a chair using your arms (e.g., wheelchair or bedside chair)?: A Lot ?Help needed to walk in hospital room?: Total ?Help needed climbing 3-5 steps with a railing? : Total ?6 Click Score: 11 ? ?  ?End of Session Equipment Utilized During Treatment: Gait belt ?Activity Tolerance: Patient limited by fatigue;Patient tolerated treatment well ?Patient left: in chair;with chair alarm set;with call bell/phone within reach ?Nurse Communication: Mobility status ?Pain - Right/Left: Right ?Pain - part of body: Shoulder ?  ? ? ?Time: 0092-3300 ?PT Time Calculation (min) (ACUTE ONLY): 35 min ? ?Charges:  $Therapeutic Activity: 23-37 mins          ?          ? ?Tresa Endo PT ?Acute Rehabilitation Services ?Pager [phone] ?Office [phone] ? ? ? ?Yeva Bissette, Shella Maxim ?11/18/2021, 3:02 PM ? ?

## 2021-11-18 NOTE — Progress Notes (Signed)
?PROGRESS NOTE ? ? ? ?Jessica [name]  ATF:[id number] DOB: 1961-04-25 DOA: 10/22/2021 ?PCP: Jessica Pretty, MD  ? ? ? ?Brief Narrative:  ?Jessica [name] is an 61 y.o. female past medical history significant for alcohol abuse, with admissions for severe alcohol withdrawals requiring ICU admissions tobacco use presented to the ED on 10/22/2021 diffuse abdominal pain that started the day prior to admission.  CT of the abdomen pelvis showed pneumoperitoneum and free fluid, severe sepsis with started on broad-spectrum antibiotics fluid resuscitated surgery was consulted was taken emergently to the OR underwent exploratory laparotomy and was found to have a perforated sigmoid colon, status post Hartmann procedure and insertion of the G-tube which was left to drain to gravity, JP in pelvis was noted to have feculent peritonitis in the pelvis.  ICU course was complicated by Klebsiella bacteremia profound caloric protein malnutrition requiring TPN, acute respiratory failure with hypoxia and tachycardia with failure to progress ? ? ?Subjective: ?5/12 afebrile overnight, A/O x4.  Much more alert and interactive today. ? ? ?Assessment & Plan: ?Covid vaccination; ?  ?Principal Problem: ?  Severe sepsis due to perforated sigmoid colon s/p Hartmann/colostomy 10/22/2021 ?Active Problems: ?  Acute respiratory failure with hypoxia ([place]) ?  Acute metabolic encephalopathy ?  Oropharyngeal dysphagia ?  Chronic pain ?  Tobacco use disorder ?  Protein-calorie malnutrition, severe ([place]) ?  AKI (acute kidney injury) ([place]) ?  Hypernatremia, hyponatremia, hypokalemia, hyperphosphatemia ?  Elevated brain natriuretic peptide (BNP) level ?  Right proximal humeral fracture ?  Normocytic anemia ?  Substance induced mood disorder ([place]) ?  Anxiety ?  Primary insomnia ?  Underweight ?  Colostomy in place [facility]) ?  Stricture and stenosis of esophagus ?  Gastrostomy tube in place [facility]) ?  Hyponatremia ?  Palliative care by specialist ?  Goals of care,  counseling/discussion ?  General weakness ?  Septic shock ([place]) ?  Postoperative intra-abdominal abscess ?  Colocutaneous fistula ?  Generalized abdominal pain ?  Aspiration pneumonia ([place]) ?  Bacteremia due to Klebsiella pneumoniae ?  Acute encephalopathy ?  Humerus fracture ? ?Severe septic shock due to perforated sigmoid colon  ?-4/15/ 2023 s/p Hartmann/colostomy  ?-10/21/2021 s/p G-tube placement.  Complicated by colocutaneous fistula with Pseudomonas intra-abdominal leak requiring percutaneous drainage: ?-10/29/2021 lower quadrant drain placed: Managed per IR ?Repeated CT scan on 11/10/2031 showed new pelvic collection not amenable to drainage likely an abscess. ?Patient is febrile, with a Tmax of 100.9. ?-CT on 10/10/2021 showed pelvic fluid collection not amenable to drainage.  Lesion abscess. ?Keep the patient n.p.o. continue NG tube and TNA and antibiotics per surgery. ?-Antibiotics were resumed on 11/14/2021. ?-5/10 s/p fluoro guided exchange up-sizing and repositioning of now 14 Fr drainage catheter into peri-stomal collection yielding feculent debris ?-5/11 CT abdomen and pelvis showing continued abscesses/fluid collection.  Will discuss in a.m. with CCS if now amenable to drainage ?-5/12 discussed case with PA Obie Dredge CCS and concurs that surgery at this time would be too high risk.  We will continue with antibiotics. ?- 5/12 discuss in a.m. with ID revised timeline for length of antibiotic treatment ? ?Septic shock/Aspiration pneumonia/Abdominal infection: ?-Has been weaned off pressors  ?-Strict in and out +13.2 L ?- Daily weight ?Filed Weights  ? 11/15/21 0354 11/16/21 0500 11/18/21 0500  ?Weight: 53.2 kg 54.3 kg 55.3 kg  ?   ? ?Acute respiratory failure with hypoxia/Aspiration pneumonia: ?-Extubated on 10/27/2021 due to altered mental status had to be reintubated on 11/07/2021. ?-Extubated  on 11/08/2021. ?-Currently n.p.o. ,now on room air. ?-Physical therapy is on board. Will need SNF vs LTAC. ?    ?Klebsiella bacteremia ?-Blood cultures on 10/23/2018 ?-Initially placed on vancomycin Flagyl and cefepime on 10/22/2021 ?-De-escalated to IV Zosyn 10/24/2021, she has now completed 2-week course of IV antibiotics for bacteremia. ?-Continue IV Zosyn per surgery for intra-abdominal infection ?-5/11 given patient's complicated multiple infections will consult ID in a.m. ? ? ?Acute encephalopathy  ?-Multifactorial  ICU delirium, infectious etiology, EtOH withdrawal: ?-5/10 resolved . ?-Continue low-dose fentanyl for pain. ?-Limit sedating medication. ?-5/11 resolved ? ?AKI ?-Secondary to severe septic shock: ?-Was on IV diuresis, diuretics were held she is positive about 9 L. ? ?Lab Results  ?Component Value Date  ? CREATININE 0.90 11/18/2021  ? CREATININE 0.80 11/17/2021  ? CREATININE 0.99 11/16/2021  ? CREATININE 1.14 (H) 11/15/2021  ? CREATININE 1.19 (H) 11/14/2021  ?-5/11 resolved ? ?Essential HTN ?- 5/11 Metoprolol IV 5 mg TID ?   ?Normocytic anemia: ?-4/22 transfuse 1 unit PRBC ?-5/3 transfuse 1 unit PRBC ?-5/10 anemia panel pending ?-5/12 transfuse for hemoglobin<7 ? ?Severe protein caloric malnutrition/cachexia: ?-Currently on TPN. ?-G-tube placed on 10/22/2021, ?-Speech on board to evaluate. ? ?Oropharyngeal dysphagia: ?-5/11 Speech concurs with continued n.p.o., TPN and antibiotics per surgery.  ?-5/12 patient much more alert and interactive reconsult speech for swallow evaluation. ? ?Right proximal humerus fracture: ?-Status post fall in March 2023 seen by Ortho during that month with plan for nonweightbearing for 1 month then follow-up with them as an outpatient. ? ?Hypernatremia//hyponatremia: ?Lab Results  ?Component Value Date  ? NA 130 (L) 11/17/2021  ? NA 129 (L) 11/16/2021  ? NA 133 (L) 11/15/2021  ? NA 133 (L) 11/14/2021  ? NA 136 11/13/2021  ?-Slightly low, asymptomatic. ? ?  ?Hypokalemia ?-Potassium goal> 4 ?-5/10 potassium IV 50 mEq ? ?Hypomagnesmia ?- 5/10 Magnesium goal> 2 ?- 5/10 magnesium IV 3  g ?  ?Substance induced mood disorder (HCC)/ Anxiety ? ? ?Pressure Injury 10/27/21 Vertebral column Mid;Medial Deep Tissue Pressure Injury - Purple or maroon localized area of discolored intact skin or blood-filled blister due to damage of underlying soft tissue from pressure and/or shear. (Active)  ?10/27/21 0428  ?Location: Vertebral column  ?Location Orientation: Mid;Medial  ?Staging: Deep Tissue Pressure Injury - Purple or maroon localized area of discolored intact skin or blood-filled blister due to damage of underlying soft tissue from pressure and/or shear.  ?Wound Description (Comments):   ?Present on Admission: No  ?Dressing Type Foam - Lift dressing to assess site every shift 11/18/21 0804  ?-Continue treatment per wound care ? ?Severe protein calorie malnutrition ?- Continue TPN ?  ? ? ?Mobility Assessment (last 72 hours)   ? ? Mobility Assessment   ? ? [place] Name 11/18/21 0804 11/17/21 2104 11/17/21 1700 11/15/21 1423  ?  ? Does patient have an order for bedrest or is patient medically unstable No - Continue assessment No - Continue assessment No - Continue assessment --   ? What is the highest level of mobility based on the progressive mobility assessment? Level 3 (Stands with assist) - Balance while standing  and cannot march in place Level 3 (Stands with assist) - Balance while standing  and cannot march in place -- Level 3 (Stands with assist) - Balance while standing  and cannot march in place   ? Is the above level different from baseline mobility prior to current illness? Yes - Recommend PT order No - Consider discontinuing PT/OT -- --   ? ?  ?  ? ?  ? ?  Goals of care ?- 5/10 palliative care consult: Discuss change of CODE STATUS to DNR, HCPOA, ? ? ?Interdisciplinary Goals of Care Family Meeting ? ? ?Date carried out: 11/18/2021 ? ?Location of the meeting: ICU ? ?Member's involved: Dr.Anwar, patient ? ?Durable Power of Tour manager: Boyfriend [name] [place]  HCPOA ? ?Discussion: We discussed goals of care for [employer] .   ? ?Code status: Full code ? ?Disposition: recommend SNF rehab with palliative ? ?Time spent for the meeting: 30 minutes ? ? ? ?Finlee Milo, Geraldo Docker, MD ? ?5/12/202

## 2021-11-18 NOTE — Progress Notes (Addendum)
Tylenol given to relieve low grade fever (99.4 to 99.8) which elevated slowly. ?Rash all over her body and especially upper body, neck, back,  and legs. Pt stated mild itchiness  and rash started couple days ago. Pt's skin was warm to touch . Benadryl given earlier and pt stated no itchiness at this time.  ?

## 2021-11-18 NOTE — TOC Progression Note (Signed)
Transition of Care (TOC) - Progression Note  ? ? ?Patient Details  ?Name: Jessica [name] ?MRN: [id number] ?Date of Birth: 07-27-1960 ? ?Transition of Care (TOC) CM/SW Contact  ?Leeroy Cha, RN ?Phone Number: ?11/18/2021, 10:18 AM ? ?Clinical Narrative:    ?No significant changes in care.  Palliative following.  Cm following for toc needs. ? ? ?Expected Discharge Plan: Home/Self Care (TBD) ?Barriers to Discharge: Continued Medical Work up ? ?Expected Discharge Plan and Services ?Expected Discharge Plan: Home/Self Care (TBD) ?  ?Discharge Planning Services: CM Consult ?  ?Living arrangements for the past 2 months: [place] ?                ?  ?  ?  ?  ?  ?  ?  ?  ?  ?  ? ? ?Social Determinants of Health (SDOH) Interventions ?  ? ?Readmission Risk Interventions ? ?  10/24/2021  ? 10:10 AM  ?Readmission Risk Prevention Plan  ?Transportation Screening Complete  ?PCP or Specialist Appt within 3-5 Days Complete  ?[place] or Home Care Consult Complete  ?Social Work Consult for [place] Planning/Counseling Complete  ?Palliative Care Screening Complete  ?Medication Review Press photographer) Complete  ? ? ?

## 2021-11-18 NOTE — Progress Notes (Signed)
PHARMACY - TOTAL PARENTERAL NUTRITION CONSULT NOTE  ? ?Indication: Prolonged ileus ? ?Patient Measurements: ?Height: '5\' 10"'$  (177.8 cm) ?Weight: 55.3 kg (121 lb 14.6 oz) ?IBW/kg (Calculated) : 68.5 ?TPN AdjBW (KG): 53.8 ?Body mass index is 17.49 kg/m?. ?Usual Weight: 53.8 kg ? ?Assessment: 61 yo F w/ PMH etoh use disorder, smoker, HTN, GERD. S/p Exp Lap Hartmans 10/22/2021 for perforated sigmoid colon, Dr. Marcello Moores.   ? ?Glucose / Insulin: no hx DM.  CBGs continue to be <150.  (insulin d/c 5/1) ?Electrolytes: Na remains low despite adding back and titrating up over the past 3 days; K & Mg improved to WNL after supplementation yesterday; Cl, Ca, Phos all stable WNL; bicarb borderline low but improved ?Vitamins:  Vitamin C <0.1 (4/22); receiving 200 mg ascorbic acid daily in TPN from MVI ?Vitamin A 14.2 (4/22); receiving 1 mg of vitamin A daily in TPN from [facility] ?Unable to add additional ascorbic acid and vitamin A to TPN per TPN policy ?5/5 - initiated ascorbic acid 500 mg per tube BID and vitamin A 10,000 units per tube daily. Tube to be clamped x1 hour after administration then returned to suction.  ?Renal: SCr decreased to WNL; BUN still elevated but trending down; UOP remains adequate ?Hepatic: LFTs, Tbili WNL; TG stable WNL ?Intake / Output: Charting shows +11L this admission ?- negligible abd drain output ? ?GI Imaging:  ?4/15 CTA: bowel perforation ?4/17 KUB ileus or pSBO ?4/20 CTA: severe ileus or pSBO ?5/04 CT: new pelvic collection not amenable to drainage, likely an abscess ?5/11 CTa/p: fluid collections unchanged from 5/4 ?GI Surgeries / Procedures:  ?10/22/21 s/p EXPLORATORY LAPAROTOMY hartmans for perforated sigmoid colon ?4/23 IR placed drain LLQ ?5/10 Drainage cath exchanged ? ?Central access: CVC TL placed 10/23/21 ?TPN start date: 10/26/21 ? ?Nutritional Goals: ?Goal TPN rate is 90 mL/hr (provides 125 g of protein and ~ 2060 kcals per day)  ? ?RD Assessment: (5/10) ?Estimated Needs ?Total Energy Estimated  Needs: 2000-2200 kcal ?Total Protein Estimated Needs: 105-125 grams ?Total Fluid Estimated Needs: >/= 2.2 L/day ? ?Current Nutrition:  ?TPN ?NPO except for ice chips ?G-tube clamped 5/7. Tolerating ice chips/sips. SLP following ? ?Plan:  ?At 18:00:  Continue TPN @  90 mL/hr to provide 100% of goals ?Electrolytes in TPN: no changes from yesterday ?Na 75 mEq/L ?K  40 mEq/L ?Ca 5 mEq/L ?Mg 8 mEq/L ?Phos 5 mmol/L ?Cl:Ac 1:1 ?Continue standard MVI and trace elements to TPN ?Continue folic acid & thiamine in TPN ?Monitor TPN labs on Mon/Thurs ?Bmet, Mg, Phos tomorrow ?Follow up enteral intake for appropriate wean off TPN   ? ? ?Xylah Early A, PharmD, BCPS ?Clinical Pharmacist ?11/18/2021 7:53 AM ? ? ? ? ? ? ? ?

## 2021-11-18 NOTE — Progress Notes (Addendum)
? ? ?Assessment & Plan: ?POD#27 - PERFORATED SIGMOID COLON - status post EX LAP, HARTMAN'S PROCEDURE, Gastrostomy tube placement - 10/16/8117 Dr. Leighton Ruff; ?- path with diverticulitis, no malignancy ?OR FINDINGS: Purulent ascites throughout the abdomen and stool contamination in the pelvis due to necrotic perforated sigmoid colon ?- Status post drainage of LLQ fluid collection by IR - 4/22, Cx with pseudomonas; drain adjusted and upsized 5/10 ?- Midline wound with colocutaneous fistula. Continue Eakins pouch. WOCN following for colostomy and pouching of fistula ?- CT 5/4 also with pelvic fluid collection measuring 6 x 3.2 cm. Discussed with IR and not felt amenable/safe for drainage. IV abx. Repeat CT 5/11 w/ stable pelvic abscess that remains unable to be safely accessed per IR (Dr. Kathlene Cote) ?-  G-tube clamped 5/7. Patient tolerating. Tolerating ice chips/sips. SLP following, currently sips of clears with tsp - ok to advance diet if she tolerates from a CCS perspective. Per SLP ? ?FEN - G-tube capped, TPN, CLD per tsp per SLP ?VTE - SCDs, SQH ?ID - zosyn 4/17 - 4/27; 4/28-5/6, 5/8 >> ?  ?Continue IV abx - do not have source control at this time, pelvic abscess not amenable to drainage. Advance diet per SLP and monitor drain/fistula output. No emergent surgical needs. Continue to try to avoid surgery if possible as she is only 4 weeks out from her last operation - her abdomen would be scarred in and high risk for complications, not to mention she has not had any enteral nutrition since surgery.  We will continue to follow closely. Check prealbumin in AM. ? ?      Obie Dredge, PA-C ?[facility] ?A [facility] practice ?Office: [phone] ?       ?Chief Complaint: ?Perforated colon ? ?Subjective: ?Patient in bed, resting. agrees to let me examine her.  ? ?AFVSS ? ?Objective: ?Vital signs in last 24 hours: ?Temp:  [98.2 ?F (36.8 ?C)-99.8 ?F (37.7 ?C)] 99.8 ?F (37.7 ?C) (05/12 0541) ?Pulse Rate:   [97-119] 108 (05/12 0541) ?Resp:  [14-23] 16 (05/12 0541) ?BP: (130-172)/(64-113) 140/64 (05/12 0541) ?SpO2:  [96 %-100 %] 99 % (05/12 0733) ?Weight:  [55.3 kg] 55.3 kg (05/12 0500) ?Last BM Date : 11/17/21 ? ?Intake/Output from previous day: ?05/11 0701 - 05/12 0700 ?In: 2001.8 [I.V.:1847.2; IV Piggyback:129.6] ?Out: 1425 [Urine:1250; Stool:175] ?Intake/Output this shift: ?No intake/output data recorded. ? ?Physical Exam: ?HEENT - sclerae clear, mucous membranes moist ?Neck - soft ?Abdomen - soft, overall nontender ? Midline wound/[facility] - eakins pouch in place with feculent drainage ? LLQ ostomy - stoma viable, liquid stool in pouch ? JP - tan feculent drainage  ? G tube - capped ?Ext - no edema, non-tender ? ?Lab Results:  ?No results for input(s): WBC, HGB, HCT, PLT in the last 72 hours. ? ?BMET ?Recent Labs  ?  11/16/21 ?3086 11/17/21 ?0200  ?NA 129* 130*  ?K 3.2* 4.0  ?CL 101 102  ?CO2 20* 22  ?GLUCOSE 123* 121*  ?BUN 48* 42*  ?CREATININE 0.99 0.80  ?CALCIUM 8.3* 7.9*  ? ?PT/INR ?No results for input(s): LABPROT, INR in the last 72 hours. ?Comprehensive Metabolic Panel: ?   ?Component Value Date/Time  ? NA 130 (L) 11/17/2021 0200  ? NA 129 (L) 11/16/2021 5784  ? K 4.0 11/17/2021 0200  ? K 3.2 (L) 11/16/2021 6962  ? CL 102 11/17/2021 0200  ? CL 101 11/16/2021 0508  ? CO2 22 11/17/2021 0200  ? CO2 20 (L) 11/16/2021 0508  ? BUN 42 (  H) 11/17/2021 0200  ? BUN 48 (H) 11/16/2021 0508  ? CREATININE 0.80 11/17/2021 0200  ? CREATININE 0.99 11/16/2021 0508  ? GLUCOSE 121 (H) 11/17/2021 0200  ? GLUCOSE 123 (H) 11/16/2021 6812  ? CALCIUM 7.9 (L) 11/17/2021 0200  ? CALCIUM 8.3 (L) 11/16/2021 0508  ? AST 22 11/17/2021 0200  ? AST 27 11/14/2021 0430  ? ALT 21 11/17/2021 0200  ? ALT 22 11/14/2021 0430  ? ALKPHOS 112 11/17/2021 0200  ? ALKPHOS 129 (H) 11/14/2021 0430  ? BILITOT 0.7 11/17/2021 0200  ? BILITOT 0.9 11/14/2021 0430  ? PROT 6.3 (L) 11/17/2021 0200  ? PROT 7.0 11/14/2021 0430  ? ALBUMIN 2.0 (L) 11/17/2021 0200  ?  ALBUMIN 2.2 (L) 11/14/2021 0430  ? ? ?Studies/Results: ?CT ABDOMEN PELVIS W CONTRAST ? ?Result Date: 11/17/2021 ?CLINICAL DATA:  Intraop abscess EXAM: CT ABDOMEN AND PELVIS WITH CONTRAST TECHNIQUE: Multidetector CT imaging of the abdomen and pelvis was performed using the standard protocol following bolus administration of intravenous contrast. RADIATION DOSE REDUCTION: This exam was performed according to the departmental dose-optimization program which includes automated exposure control, adjustment of the mA and/or kV according to patient size and/or use of iterative reconstruction technique. CONTRAST:  37m OMNIPAQUE IOHEXOL 300 MG/ML  SOLN COMPARISON:  CT abdomen and pelvis dated Nov 10, 2021 FINDINGS: Lower chest: Small left pleural effusion with atelectasis. Linear opacity of the lingula with associated traction bronchiectasis, likely sequela of prior infection. Hepatobiliary: No focal liver abnormality is seen. Cholelithiasis. Unchanged gallbladder mucosal enhancement with no evidence of gallbladder distension. No biliary ductal dilation. Pancreas: Unremarkable. No pancreatic ductal dilatation or surrounding inflammatory changes. Spleen: Normal in size without focal abnormality. Adrenals/Urinary Tract: Adrenal glands are unremarkable. Kidneys are normal, without renal calculi, focal lesion, or hydronephrosis. Mild wall thickening of the urinary bladder which is likely reactive. Stomach/Bowel: Gastrostomy tube is seen in the body of the stomach. 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 Numerous matted loops of small bowel seen in the pelvis with adjacent rim enhancing pelvic fluid collection measuring 6.5 x 3.1 cm on series 3, image 64, unchanged in size when compared with prior exam. More anteriorly located fluid collection which contains a pigtail catheter located on series 3, image 87 unchanged in size when compared with prior exam, measuring approximately 0.8 x 1.6 cm, unchanged when compared with  prior exam and remeasured in similar plane. Small collection along the left pelvic sidewall measures up to 2.5 cm on series 3, image 48, previously 2.5 cm. Vascular/Lymphatic: Aortic atherosclerosis. No enlarged abdominal or pelvic lymph nodes. Reproductive: Prior hysterectomy. Other: No ascites or pneumoperitoneum. Stable appearance of midline wound, compatible with history of colocutaneous fistula. Musculoskeletal: Unchanged severe compression deformity of L1. Moderate to severe degenerative disc disease at L3-L4. No aggressive appearing osseous lesions. IMPRESSION: 1. Rim enhancing fluid collections of the pelvis are unchanged in size when compared with prior exam, more anteriorly located collection contains a pigtail drainage catheter. 2. 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 Numerous matted and thick-walled loops of large and small bowel small bowel seen in the pelvis, similar to prior exam. 3. Small left pleural effusion. 4. Cholelithiasis. Electronically Signed   By: LYetta GlassmanM.D.   On: 11/17/2021 12:38  ? ?IR Catheter Tube Change ? ?Result Date: 11/16/2021 ?CLINICAL DATA:  History of colonic resection, post percutaneous drainage catheter placement 10/29/2021. EXAM: IR CATHETER TUBE CHANGE COMPARISON:  CT abdomen pelvis-11/10/2021; 11/04/2021; 10/28/2021 CT-guided percutaneous catheter placement-10/29/2021 CONTRAST:  10 cc Omnipaque 300-administered  via the percutaneous drainage catheter. MEDICATIONS: None. ANESTHESIA/SEDATION: None FLUOROSCOPY TIME:  1 minute, 30 seconds (3 mGy) TECHNIQUE: Patient was positioned supine on the fluoroscopy table. The external portion of the existing percutaneous drainage catheter as well as the surrounding skin was prepped and draped in usual sterile fashion. A preprocedural spot fluoroscopic image was obtained of the existing percutaneous drainage catheter. A small amount of contrast was injected via the existing percutaneous drainage catheter and several  fluoroscopic images were obtained in various obliquities. The external portion of the percutaneous drainage catheter was cut and cannulated with a short Amplatz wire. Under intermittent fluoroscopic guid

## 2021-11-18 NOTE — Progress Notes (Addendum)
?   11/18/21 0042  ?Assess: MEWS Score  ?Temp 99.4 ?F (37.4 ?C)  ?BP 133/71  ?Pulse Rate (!) 119  ?Resp 16  ?SpO2 96 %  ?O2 Device Room Air  ?Assess: MEWS Score  ?MEWS Temp 0  ?MEWS Systolic 0  ?MEWS Pulse 2  ?MEWS RR 0  ?MEWS LOC 0  ?MEWS Score 2  ?MEWS Score Color Yellow  ?Assess: SIRS CRITERIA  ?SIRS Temperature  0  ?SIRS Pulse 1  ?SIRS Respirations  0  ?SIRS WBC 0  ?SIRS Score Sum  1  ? ?Pulse was elevated and Temperature elevated from 99.2 to 99.4. Pt refused Tylenol at this time. Notify CN and On-Call NP and will continue to monitor. ?

## 2021-11-18 NOTE — Consult Note (Signed)
[place] Nurse wound follow up ?Patient receiving care in [place] 1408. Patient now with a total body rash type situation of unknown etiology. ?Wound type: midline surgical wound with fistula just above inferior border. Feculent drainage eminating from fistula. ?Measurement: deferred ?Wound bed: pink ?Drainage (amount, consistency, odor) feculent drainage ?Periwound: intact ?Dressing procedure/placement/frequency: ?The existing small Eakin pouch Kellie Simmering [phone]) removed, area cleansed, new small Eakin pouch placed. I used the portion of the Eakin pouch barrier that was cut out to accommodate the wound, as a barrier along the inferior border.  Patient tolerated well.  Two more small Eakin pouches and pattern in bedside chair for future use. ? ?[place] Nurse ostomy follow up ?Stoma type/location: LUQ colostomy ?Stomal assessment/size: approximately 1.5 inches, budded, pink, moist. There is an area of mucocutaneous separation at 7 o'clock that is approximately 1 cm deep. ?Peristomal assessment: intact ?Treatment options for stomal/peristomal skin: barrier ring and close fitting pouch ?Output: thin brown feces ?Ostomy pouching: 1pc. Pouch, Kellie Simmering [phone]. Available in supply room on unit ?Education provided: none.  The patient looked downwards towards the ostomy and wound, but stated, "this is way over my head and it's gross", then avoided looking at the care procedures.  The patient continues to be unable to teach the care steps. ?Enrolled patient in [place] Discharge program: Yes, previously. ?Val Riles, RN, MSN, CWOCN, CNS-BC, pager [phone]  ?

## 2021-11-19 LAB — COMPREHENSIVE METABOLIC PANEL
ALT: 24 U/L (ref 0–44)
AST: 20 U/L (ref 15–41)
Albumin: 1.9 g/dL — ABNORMAL LOW (ref 3.5–5.0)
Alkaline Phosphatase: 103 U/L (ref 38–126)
Anion gap: 7 (ref 5–15)
BUN: 37 mg/dL — ABNORMAL HIGH (ref 6–20)
CO2: 20 mmol/L — ABNORMAL LOW (ref 22–32)
Calcium: 7.8 mg/dL — ABNORMAL LOW (ref 8.9–10.3)
Chloride: 100 mmol/L (ref 98–111)
Creatinine, Ser: 0.78 mg/dL (ref 0.44–1.00)
GFR, Estimated: >60 mL/min (ref >60)
Glucose, Bld: 117 mg/dL — ABNORMAL HIGH (ref 70–99)
Potassium: 3.7 mmol/L (ref 3.5–5.1)
Sodium: 127 mmol/L — ABNORMAL LOW (ref 135–145)
Total Bilirubin: 0.6 mg/dL (ref 0.3–1.2)
Total Protein: 5.9 g/dL — ABNORMAL LOW (ref 6.5–8.1)

## 2021-11-19 LAB — CULTURE, BLOOD (ROUTINE X 2)
Culture: NO GROWTH
Culture: NO GROWTH
Special Requests: ADEQUATE
Special Requests: ADEQUATE

## 2021-11-19 LAB — CBC WITH DIFFERENTIAL/PLATELET
Abs Immature Granulocytes: 0.03 10*3/uL (ref 0.00–0.07)
Basophils Absolute: 0 10*3/uL (ref 0.0–0.1)
Basophils Relative: 0 %
Eosinophils Absolute: 0.4 10*3/uL (ref 0.0–0.5)
Eosinophils Relative: 5 %
HCT: 20.6 % — ABNORMAL LOW (ref 36.0–46.0)
Hemoglobin: 6.9 g/dL — CL (ref 12.0–15.0)
Immature Granulocytes: 0 %
Lymphocytes Relative: 29 %
Lymphs Abs: 2.3 10*3/uL (ref 0.7–4.0)
MCH: 29.6 pg (ref 26.0–34.0)
MCHC: 33.5 g/dL (ref 30.0–36.0)
MCV: 88.4 fL (ref 80.0–100.0)
Monocytes Absolute: 0.5 10*3/uL (ref 0.1–1.0)
Monocytes Relative: 7 %
Neutro Abs: 4.6 10*3/uL (ref 1.7–7.7)
Neutrophils Relative %: 59 %
Platelets: 232 10*3/uL (ref 150–400)
RBC: 2.33 MIL/uL — ABNORMAL LOW (ref 3.87–5.11)
RDW: 16.2 % — ABNORMAL HIGH (ref 11.5–15.5)
WBC: 7.8 10*3/uL (ref 4.0–10.5)
nRBC: 0 % (ref 0.0–0.2)

## 2021-11-19 LAB — GLUCOSE, CAPILLARY
Glucose-Capillary: 127 mg/dL — ABNORMAL HIGH (ref 70–99)
Glucose-Capillary: 128 mg/dL — ABNORMAL HIGH (ref 70–99)
Glucose-Capillary: 128 mg/dL — ABNORMAL HIGH (ref 70–99)
Glucose-Capillary: 129 mg/dL — ABNORMAL HIGH (ref 70–99)
Glucose-Capillary: 130 mg/dL — ABNORMAL HIGH (ref 70–99)

## 2021-11-19 LAB — HEMOGLOBIN AND HEMATOCRIT, BLOOD
HCT: 25.2 % — ABNORMAL LOW (ref 36.0–46.0)
Hemoglobin: 8.4 g/dL — ABNORMAL LOW (ref 12.0–15.0)

## 2021-11-19 LAB — PHOSPHORUS: Phosphorus: 3.1 mg/dL (ref 2.5–4.6)

## 2021-11-19 LAB — MAGNESIUM: Magnesium: 1.7 mg/dL (ref 1.7–2.4)

## 2021-11-19 LAB — PREPARE RBC (CROSSMATCH)

## 2021-11-19 LAB — PREALBUMIN: Prealbumin: 18.2 mg/dL (ref 18–38)

## 2021-11-19 MED ORDER — TRAVASOL 10 % IV SOLN
INTRAVENOUS | Status: AC
  Filled 2021-11-19: qty 1252.8

## 2021-11-19 MED ORDER — MAGNESIUM SULFATE IN D5W 1-5 GM/100ML-% IV SOLN
1.0000 g | Freq: Once | INTRAVENOUS | Status: AC
  Administered 2021-11-19: 1 g via INTRAVENOUS
  Filled 2021-11-19: qty 100

## 2021-11-19 MED ORDER — POTASSIUM CHLORIDE 10 MEQ/50ML IV SOLN
10.0000 meq | INTRAVENOUS | Status: AC
  Administered 2021-11-19 (×2): 10 meq via INTRAVENOUS
  Filled 2021-11-19 (×2): qty 50

## 2021-11-19 MED ORDER — SODIUM CHLORIDE 0.9% IV SOLUTION: Freq: Once | INTRAVENOUS | Status: DC

## 2021-11-19 MED ORDER — METOPROLOL TARTRATE 5 MG/5ML IV SOLN
5.0000 mg | Freq: Four times a day (QID) | INTRAVENOUS | Status: DC
  Administered 2021-11-19 – 2021-11-22 (×13): 5 mg via INTRAVENOUS
  Filled 2021-11-19 (×13): qty 5

## 2021-11-19 MED ORDER — DIPHENHYDRAMINE HCL 25 MG PO CAPS
25.0000 mg | ORAL_CAPSULE | Freq: Four times a day (QID) | ORAL | Status: DC | PRN
  Administered 2021-11-19 – 2021-11-27 (×5): 25 mg via ORAL
  Filled 2021-11-19 (×5): qty 1

## 2021-11-19 MED ORDER — SALINE SPRAY 0.65 % NA SOLN
1.0000 | NASAL | Status: DC | PRN
  Filled 2021-11-19: qty 44

## 2021-11-19 NOTE — Progress Notes (Signed)
SLP Cancellation Note ? ?Patient Details ?Name: Jessica [name] ?MRN: [id number] ?DOB: 10-Aug-1960 ? ? ?Cancelled treatment:        Attempted to see pt for swallowing reassessment.  MD requested repeat eval as pt is much more alert today than during prior assessment in ICU.  Per surgery note 5/13 may advance as tolerated per SLP.  Pt in chair at time of SLP arrival completing transfusion.  Pt expressed she is very tired and would like to get back in bed.  Pt politely declined PO trials and SLP assessment this date.  Pt is currently receiving TPN and clear liquids.  SLP to follow for reassessment as schedule permits.  ? ? ?Aidan Moten E Yuette Putnam, MA, CCC-SLP ?Acute Rehabilitation Services ?Office: [phone] ?11/19/2021, 3:37 PM ?

## 2021-11-19 NOTE — Progress Notes (Signed)
Patient refused to be extracted by blood via Picc line this morning. IV team came twice to get specimen.Will consult IV team again upon pt's consent and readiness.  ?

## 2021-11-19 NOTE — Evaluation (Signed)
Occupational Therapy Evaluation ?Patient Details ?Name: Jessica [name] ?MRN: [id number] ?DOB: [birth year] ?Today's Date: 11/19/2021 ? ? 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  Pt intubated post op and extubated 10/27/21. Re Intubated 11/07/21.   Pt with hx of ETOH abuse and recent R shoulder fx (09/20/21) with X ray from 10/27/21 indicating fx still present.  ? ?Clinical Impression ?  ?Patient was signed off for OT due to change in medical status on 5/2. Patient has been reordered for skilled OT services at this time with patient noted to have had an improvement in participation in communication since this therapist wrote a cancellation note in ICU. Patient continues to have global weakness, increased pain, decreased endurance, decreased standing balance and tolerance, decreased functional activity tolerance impacting participation in ADLs. Patient would continue to benefit from skilled OT services at this time while admitted and after d/c to address noted deficits in order to improve overall safety and independence in ADLs.  ? ?   ? ?Recommendations for follow up therapy are one component of a multi-disciplinary discharge planning process, led by the attending physician.  Recommendations may be updated based on patient status, additional functional criteria and insurance authorization.  ? ?Follow Up Recommendations ? Skilled nursing-short term rehab (<3 hours/day)  ?  ?Assistance Recommended at Discharge Frequent or constant Supervision/Assistance  ?Patient can return home with the following Two people to help with walking and/or transfers;A lot of help with bathing/dressing/bathroom;Assistance with cooking/housework;Direct supervision/assist for medications management;Direct supervision/assist for financial management;Assist for transportation;Help with stairs or ramp for entrance ? ?  ?Functional Status Assessment ? Patient has had  a recent decline in their functional status and demonstrates the ability to make significant improvements in function in a reasonable and predictable amount of time.  ?Equipment Recommendations ? Other (comment) (defer to next venue)  ?  ?Recommendations for Other Services   ? ? ?  ?Precautions / Restrictions Precautions ?Precautions: Fall ?Precaution Comments: multiple lines--> L JP drain, gastrostomy tube, triple lumen central line, colostomy ?Restrictions ?Weight Bearing Restrictions: Yes ?RUE Weight Bearing: Non weight bearing ?Other Position/Activity Restrictions: recent R UE/shoulder fx - assume NWB, though per chart review RUE WB status expired on 10/27/21. awaiting ortho consult  ? ?  ? ?Mobility Bed Mobility ?Overal bed mobility: Needs Assistance ?  ?  ?Sidelying to sit: Min assist, HOB elevated ?  ?  ?  ?General bed mobility comments: with increased time and cues to not use RUE. patient reported she uses it all the time. patient was educated on having NWB and no ROM recommendations pending an update at this time. patient verbalized understanding. ?  ? ?Transfers ?  ?  ?  ?  ?  ?  ?  ?  ?  ?  ?  ? ?  ?Balance Overall balance assessment: Needs assistance ?Sitting-balance support: No upper extremity supported, Feet supported ?Sitting balance-Leahy Scale: Fair ?  ?  ?Standing balance support: Bilateral upper extremity supported, During functional activity, Reliant on assistive device for balance ?Standing balance-Leahy Scale: Poor ?Standing balance comment: patient needed physical support to maintain balance while standing. ?  ?  ?  ?  ?  ?  ?  ?  ?  ?  ?  ?   ? ?ADL either performed or assessed with clinical judgement  ? ?ADL Overall ADL's : Needs assistance/impaired ?Eating/Feeding: NPO ?  ?Grooming: Sitting;Moderate assistance ?  ?Upper Body Bathing: Moderate  assistance;Bed level ?  ?Lower Body Bathing: Maximal assistance;Bed level ?  ?Upper Body Dressing : Bed level;Moderate assistance ?  ?Lower Body  Dressing: Bed level;Maximal assistance ?Lower Body Dressing Details (indicate cue type and reason): patient reported donning socks herself at bed level with RLE in figure four position. ?Toilet Transfer: +2 for safety/equipment;Minimal assistance ?Toilet Transfer Details (indicate cue type and reason): patient was able to transfer from edge of bed to recliner in room with increased time. patient ?Toileting- Clothing Manipulation and Hygiene: Bed level;Total assistance ?  ?  ?  ?Functional mobility during ADLs: +2 for safety/equipment;+2 for physical assistance;Minimal assistance ?General ADL Comments: patient noted to have globalized weakness with impaired balance, decreased activity tolerance, increased pain and decreased endurance.  ? ? ? ?Vision Patient Visual Report: No change from baseline ?   ?   ?Perception   ?  ?Praxis   ?  ? ?Pertinent Vitals/Pain Pain Assessment ?Pain Assessment: Faces ?Faces Pain Scale: Hurts even more ?Pain Location: R shoulder, and near colostomy bag site. ?Pain Descriptors / Indicators: Discomfort, Grimacing ?Pain Intervention(s): Limited activity within patient's tolerance, Monitored during session  ? ? ? ?Hand Dominance Right ?  ?Extremity/Trunk Assessment Upper Extremity Assessment ?RUE Deficits / Details: Shoulder ROM not assessed 2* presence of fx from 09/20/21 and still present on chest Xray 10/27/21. hospitalist secure chatted on this date to get update on ROM and WB recommendations updated since they have expired. ?  ?Lower Extremity Assessment ?Lower Extremity Assessment: Defer to PT evaluation ?  ?Cervical / Trunk Assessment ?Cervical / Trunk Assessment: Normal ?  ?Communication Communication ?Communication: No difficulties ?  ?Cognition Arousal/Alertness: Awake/alert ?Behavior During Therapy: [facility] for tasks assessed/performed ?Overall Cognitive Status: Impaired/Different from baseline ?Area of Impairment: Safety/judgement, Awareness ?  ?  ?  ?  ?  ?  ?  ?  ?  ?  ?  ?Following  Commands: Follows one step commands consistently, Follows multi-step commands with increased time ?  ?  ?Problem Solving: Slow processing ?General Comments: patient was able to communicate better on this date than when in ICU. patient was much more understandable. patient was able to make needs known. ?  ?  ?General Comments    ? ?  ?Exercises   ?  ?Shoulder Instructions    ? ? ?Home Living Family/patient expects to be discharged to:: Private residence ?Living Arrangements: Spouse/significant other ?  ?  ?  ?  ?  ?  ?  ?  ?  ?  ?  ?  ?  ?Home Equipment: None ?  ?Additional Comments: patient was easily distracted during session not feeling well. ?  ? ?  ?Prior Functioning/Environment Prior Level of Function : Independent/Modified Independent ?  ?  ?  ?  ?  ?  ?  ?  ?  ? ?  ?  ?OT Problem List: Decreased strength;Impaired balance (sitting and/or standing);Decreased activity tolerance;Decreased safety awareness;Decreased cognition;Pain ?  ?   ?OT Treatment/Interventions: Self-care/ADL training;Therapeutic exercise;DME and/or AE instruction;Therapeutic activities;Cognitive remediation/compensation;Patient/family education;Balance training  ?  ?OT Goals(Current goals can be found in the care plan section) Acute Rehab OT Goals ?Patient Stated Goal: to get to chair ?OT Goal Formulation: With patient ?Time For Goal Achievement: 12/03/21 ?Potential to Achieve Goals: Good  ?OT Frequency: Min 2X/week ?  ? ?Co-evaluation   ?  ?  ?  ?  ? ?  ?AM-PAC OT "6 Clicks" Daily Activity     ?Outcome Measure Help from another person eating meals?:  A Little ?Help from another person taking care of personal grooming?: A Little ?Help from another person toileting, which includes using toliet, bedpan, or urinal?: A Lot ?Help from another person bathing (including washing, rinsing, drying)?: A Lot ?Help from another person to put on and taking off regular upper body clothing?: A Lot ?Help from another person to put on and taking off regular  lower body clothing?: A Lot ?6 Click Score: 14 ?  ?End of Session Equipment Utilized During Treatment: Gait belt ?Nurse Communication: Other (comment) (nurse in room during session) ? ?Activity Tolerance: Patie

## 2021-11-19 NOTE — Progress Notes (Signed)
PHARMACY - TOTAL PARENTERAL NUTRITION CONSULT NOTE  ? ?Indication: Prolonged ileus ? ?Patient Measurements: ?Height: 5' 10" (177.8 cm) ?Weight: 59.9 kg (132 lb 0.9 oz) ?IBW/kg (Calculated) : 68.5 ?TPN AdjBW (KG): 53.8 ?Body mass index is 18.95 kg/m?. ?Usual Weight: 53.8 kg ? ?Assessment: 61 yo F w/ PMH etoh use disorder, smoker, HTN, GERD. S/p Exp Lap Hartmans 10/22/2021 for perforated sigmoid colon, Dr. Thomas.   ? ?Glucose / Insulin: no hx DM.  CBGs continue to be <150.  (insulin d/c 5/1) ?Electrolytes: Na (127) remains low; K (3.7) and Mg (1.7) WNL but on lower end of range. Phos and CorrCa (9.5) WNL ?-Bicarb low ?Vitamins:  Vitamin C <0.1 (4/22); receiving 200 mg ascorbic acid daily in TPN from MVI ?Vitamin A 14.2 (4/22); receiving 1 mg of vitamin A daily in TPN from MVI ?Unable to add additional ascorbic acid and vitamin A to TPN per TPN policy ?5/5 - initiated ascorbic acid 500 mg per tube BID and vitamin A 10,000 units per tube daily. Tube to be clamped x1 hour after administration then returned to suction.  ?Renal: SCr WNL; BUN still elevated but trending down; UOP 500 mL + unmeasured ?Hepatic: LFTs, Tbili, Alk Phos WNL; TG stable WNL ?Intake / Output: Strict I/O not measured.  ?-UOP: 500 mL + unmeasured. Drain output: 225 mL. Colostomy: 75 mL ?-No mIVF ?GI Imaging:  ?4/15 CTA: bowel perforation ?4/17 KUB ileus or pSBO ?4/20 CTA: severe ileus or pSBO ?5/04 CT: new pelvic collection not amenable to drainage, likely an abscess ?5/11 CTa/p: fluid collections unchanged from 5/4 ?GI Surgeries / Procedures:  ?10/22/21 s/p EXPLORATORY LAPAROTOMY hartmans for perforated sigmoid colon ?4/23 IR placed drain LLQ ?5/10 Drainage cath exchanged ? ?Central access: CVC TL placed 10/23/21 ?TPN start date: 10/26/21 ? ?Nutritional Goals: ?Goal TPN rate is 90 mL/hr (provides 125 g of protein and ~ 2060 kcals per day)  ? ?RD Assessment: (5/10) ?Estimated Needs ?Total Energy Estimated Needs: 2000-2200 kcal ?Total Protein Estimated  Needs: 105-125 grams ?Total Fluid Estimated Needs: >/= 2.2 L/day ? ?Current Nutrition:  ?TPN ?Diet advanced from NPO to CLD on 5/12 ?G-tube clamped 5/7. Tolerating ice chips/sips. SLP following ? ?Plan:  ?Now: ?Mg 1 g IV once ?KCl 10 mEq IV x 2 runs ? ?At 18:00:   ?Continue TPN @  90 mL/hr to provide 100% of goals ?Electrolytes in TPN: Increase Na, K ?Na 120 mEq/L ?K  50 mEq/L ?Ca 5 mEq/L ?Mg 8 mEq/L ?Phos 5 mmol/L ?Cl:Ac 1:2 (change) ?Continue standard MVI and trace elements to TPN ?Continue folic acid & thiamine in TPN ?Monitor TPN labs on Mon/Thurs ?Bmet, Mg, Phos tomorrow ?Follow up enteral intake for appropriate wean off TPN   ? ? M , PharmD, BCPS ?Clinical Pharmacist ?11/19/2021 9:02 AM ?

## 2021-11-19 NOTE — Progress Notes (Signed)
?PROGRESS NOTE ? ? ? ?Jessica [name]  BJS:[id number] DOB: August 19, 1960 DOA: 10/22/2021 ?PCP: Jessica Pretty, MD  ? ? ? ?Brief Narrative:  ?Jessica [name] is an 61 y.o. female past medical history significant for alcohol abuse, with admissions for severe alcohol withdrawals requiring ICU admissions tobacco use presented to the ED on 10/22/2021 diffuse abdominal pain that started the day prior to admission.  CT of the abdomen pelvis showed pneumoperitoneum and free fluid, severe sepsis with started on broad-spectrum antibiotics fluid resuscitated surgery was consulted was taken emergently to the OR underwent exploratory laparotomy and was found to have a perforated sigmoid colon, status post Hartmann procedure and insertion of the G-tube which was left to drain to gravity, JP in pelvis was noted to have feculent peritonitis in the pelvis.  ICU course was complicated by Klebsiella bacteremia profound caloric protein malnutrition requiring TPN, acute respiratory failure with hypoxia and tachycardia with failure to progress ? ? ?Subjective: ?5/13 afebrile overnight, tachycardic overnight, A/O x4 ? ? ?Assessment & Plan: ?Covid vaccination; ?  ?Principal Problem: ?  Severe sepsis due to perforated sigmoid colon s/p Hartmann/colostomy 10/22/2021 ?Active Problems: ?  Acute respiratory failure with hypoxia ([place]) ?  Acute metabolic encephalopathy ?  Oropharyngeal dysphagia ?  Chronic pain ?  Tobacco use disorder ?  Protein-calorie malnutrition, severe ([place]) ?  AKI (acute kidney injury) ([place]) ?  Hypernatremia, hyponatremia, hypokalemia, hyperphosphatemia ?  Elevated brain natriuretic peptide (BNP) level ?  Right proximal humeral fracture ?  Normocytic anemia ?  Substance induced mood disorder ([place]) ?  Anxiety ?  Primary insomnia ?  Underweight ?  Colostomy in place [facility]) ?  Stricture and stenosis of esophagus ?  Gastrostomy tube in place [facility]) ?  Hyponatremia ?  Palliative care by specialist ?  Goals of care, counseling/discussion ?   General weakness ?  Septic shock ([place]) ?  Postoperative intra-abdominal abscess ?  Colocutaneous fistula ?  Generalized abdominal pain ?  Aspiration pneumonia ([place]) ?  Bacteremia due to Klebsiella pneumoniae ?  Acute encephalopathy ?  Humerus fracture ? ?Severe septic shock due to perforated sigmoid colon  ?-4/15/ 2023 s/p Hartmann/colostomy  ?-10/21/2021 s/p G-tube placement.  Complicated by colocutaneous fistula with Pseudomonas intra-abdominal leak requiring percutaneous drainage: ?-10/29/2021 lower quadrant drain placed: Managed per IR ?Repeated CT scan on 11/10/2031 showed new pelvic collection not amenable to drainage likely an abscess. ?Patient is febrile, with a Tmax of 100.9. ?-CT on 10/10/2021 showed pelvic fluid collection not amenable to drainage.  Lesion abscess. ?Keep the patient n.p.o. continue NG tube and TNA and antibiotics per surgery. ?-Antibiotics were resumed on 11/14/2021. ?-5/10 s/p fluoro guided exchange up-sizing and repositioning of now 14 Fr drainage catheter into peri-stomal collection yielding feculent debris ?-5/11 CT abdomen and pelvis showing continued abscesses/fluid collection.  Will discuss in a.m. with CCS if now amenable to drainage ?-5/12 discussed case with PA Jessica [name] CCS and concurs that surgery at this time would be too high risk.  We will continue with antibiotics. ?- 5/13 discussed case with Dr. Jule Ser ID.  Secondary to complexity of case will evaluate patient today or tomorrow, will await recommendations.  ? ?Septic shock/Aspiration pneumonia/Abdominal infection: ?-Has been weaned off pressors  ?-Strict in and out +13.2 L ?- Daily weight ?Filed Weights  ? 11/16/21 0500 11/18/21 0500 11/19/21 0330  ?Weight: 54.3 kg 55.3 kg 59.9 kg  ?   ? ?Acute respiratory failure with hypoxia/Aspiration pneumonia: ?-Extubated on 10/27/2021 due to altered mental status had  to be reintubated on 11/07/2021. ?-Extubated on 11/08/2021. ?-Currently n.p.o. ,now on room air. ?-Physical therapy  is on board. Will need SNF vs LTAC. ?   ?Klebsiella bacteremia ?-Blood cultures on 10/23/2018 ?-Initially placed on vancomycin Flagyl and cefepime on 10/22/2021 ?-De-escalated to IV Zosyn 10/24/2021, she has now completed 2-week course of IV antibiotics for bacteremia. ?-Continue IV Zosyn per surgery for intra-abdominal infection ? ?Acute encephalopathy  ?-Multifactorial  ICU delirium, infectious etiology, EtOH withdrawal: ?-5/10 resolved . ?-Continue low-dose fentanyl for pain. ?-Limit sedating medication. ?-5/11 resolved ? ?AKI ?-Secondary to severe septic shock: ?-Was on IV diuresis, diuretics were held she is positive about 9 L. ? ?Lab Results  ?Component Value Date  ? CREATININE 0.78 11/19/2021  ? CREATININE 0.90 11/18/2021  ? CREATININE 0.80 11/17/2021  ? CREATININE 0.99 11/16/2021  ? CREATININE 1.14 (H) 11/15/2021  ?-5/11 resolved ? ?Essential HTN ?- 5/11 Metoprolol IV 5 mg TID ?-5/13 increase Metoprolol IV 5 mg QID ?   ?Normocytic anemia: ?-4/22 transfuse 1 unit PRBC ?-5/3 transfuse 1 unit PRBC ?-5/10 anemia panel pending ?Lab Results  ?Component Value Date  ? HGB 6.9 (LL) 11/19/2021  ? HGB 7.8 (L) 11/18/2021  ? HGB 8.7 (L) 11/11/2021  ? HGB 8.3 (L) 11/09/2021  ? HGB 6.6 (LL) 11/09/2021  ?-5/12 transfuse for hemoglobin<7 ?-5/13 transfuse 1 unit PRBC ? ?Severe protein caloric malnutrition/cachexia: ?-Currently on TPN. ?-G-tube placed on 10/22/2021, ?-Speech on board to evaluate. ? ?Oropharyngeal dysphagia: ?-5/11 Speech concurs with continued n.p.o., TPN and antibiotics per surgery.  ?-5/12 patient much more alert and interactive reconsult speech for swallow evaluation.  Recommendations pending  ? ?Right proximal humerus fracture: ?-Status post fall in March 2023 seen by Ortho during that month with plan for nonweightbearing for 1 month then follow-up with them as an outpatient. ? ?Hypernatremia//hyponatremia: ?Lab Results  ?Component Value Date  ? NA 127 (L) 11/19/2021  ? NA 129 (L) 11/18/2021  ? NA 130 (L)  11/17/2021  ? NA 129 (L) 11/16/2021  ? NA 133 (L) 11/15/2021  ?-Slightly low, asymptomatic. ? ?  ?Hypokalemia ?-Potassium goal> 4 ?-5/10 potassium IV 50 mEq ? ?Hypomagnesmia ?- 5/10 Magnesium goal> 2 ?- 5/10 magnesium IV 3 g ?  ?Substance induced mood disorder (HCC)/ Anxiety ? ? ?Pressure Injury 10/27/21 Vertebral column Mid;Medial Deep Tissue Pressure Injury - Purple or maroon localized area of discolored intact skin or blood-filled blister due to damage of underlying soft tissue from pressure and/or shear. (Active)  ?10/27/21 0428  ?Location: Vertebral column  ?Location Orientation: Mid;Medial  ?Staging: Deep Tissue Pressure Injury - Purple or maroon localized area of discolored intact skin or blood-filled blister due to damage of underlying soft tissue from pressure and/or shear.  ?Wound Description (Comments):   ?Present on Admission: No  ?Dressing Type Foam - Lift dressing to assess site every shift 11/18/21 2000  ?-Continue treatment per wound care ? ?  ? ? ?Mobility Assessment (last 72 hours)   ? ? Mobility Assessment   ? ? [place] Name 11/18/21 2000 11/18/21 1500 11/18/21 0804 11/17/21 2104 11/17/21 1700  ? Does patient have an order for bedrest or is patient medically unstable No - Continue assessment -- No - Continue assessment No - Continue assessment No - Continue assessment  ? What is the highest level of mobility based on the progressive mobility assessment? Level 3 (Stands with assist) - Balance while standing  and cannot march in place Level 3 (Stands with assist) - Balance while standing  and cannot march in  place Level 3 (Stands with assist) - Balance while standing  and cannot march in place Level 3 (Stands with assist) - Balance while standing  and cannot march in place --  ? Is the above level different from baseline mobility prior to current illness? Yes - Recommend PT order -- Yes - Recommend PT order No - Consider discontinuing PT/OT --  ? ?  ?  ? ?  ? ?Goals of care ?- 5/10 palliative care  consult: Discuss change of CODE STATUS to DNR, HCPOA, ? ? ?Interdisciplinary Goals of Care Family Meeting ? ? ?Date carried out: 11/19/2021 ? ?Location of the meeting: ICU ? ?Member's involved: Dr.Anwar, pati

## 2021-11-19 NOTE — Progress Notes (Signed)
28 Days Post-Op  ? ?Subjective/Chief Complaint: ?No complaints ? ? ?Objective: ?Vital signs in last 24 hours: ?Temp:  [97.9 ?F (36.6 ?C)-99.1 ?F (37.3 ?C)] 97.9 ?F (36.6 ?C) (05/13 0329) ?Pulse Rate:  [104-112] 104 (05/13 0329) ?Resp:  [20-22] 22 (05/13 0329) ?BP: (137-149)/(70-86) 148/86 (05/13 0329) ?SpO2:  [97 %-99 %] 98 % (05/13 0329) ?Weight:  [59.9 kg] 59.9 kg (05/13 0330) ?Last BM Date : 11/18/21 ? ?Intake/Output from previous day: ?05/12 0701 - 05/13 0700 ?In: 2286.8 [I.V.:2048.3; IV Piggyback:213.5] ?Out: 800 [Urine:500; Drains:225; Stool:75] ?Intake/Output this shift: ?No intake/output data recorded. ? ?General appearance: alert and cooperative ?Resp: clear to auscultation bilaterally ?Cardio: regular rate and rhythm ?GI: soft, mild tenderness. Ostomy pink and productive. Similar output from midline wound ? ?Lab Results:  ?Recent Labs  ?  11/18/21 ?1535 11/19/21 ?0815  ?WBC 7.4 7.8  ?HGB 7.8* 6.9*  ?HCT 23.8* 20.6*  ?PLT 275 232  ? ?BMET ?Recent Labs  ?  11/18/21 ?1535 11/19/21 ?0815  ?NA 129* 127*  ?K 3.6 3.7  ?CL 101 100  ?CO2 21* 20*  ?GLUCOSE 114* 117*  ?BUN 39* 37*  ?CREATININE 0.90 0.78  ?CALCIUM 7.9* 7.8*  ? ?PT/INR ?No results for input(s): LABPROT, INR in the last 72 hours. ?ABG ?No results for input(s): PHART, HCO3 in the last 72 hours. ? ?Invalid input(s): PCO2, PO2 ? ?Studies/Results: ?CT ABDOMEN PELVIS W CONTRAST ? ?Result Date: 11/17/2021 ?CLINICAL DATA:  Intraop abscess EXAM: CT ABDOMEN AND PELVIS WITH CONTRAST TECHNIQUE: Multidetector CT imaging of the abdomen and pelvis was performed using the standard protocol following bolus administration of intravenous contrast. RADIATION DOSE REDUCTION: This exam was performed according to the departmental dose-optimization program which includes automated exposure control, adjustment of the mA and/or kV according to patient size and/or use of iterative reconstruction technique. CONTRAST:  13m OMNIPAQUE IOHEXOL 300 MG/ML  SOLN COMPARISON:  CT abdomen  and pelvis dated Nov 10, 2021 FINDINGS: Lower chest: Small left pleural effusion with atelectasis. Linear opacity of the lingula with associated traction bronchiectasis, likely sequela of prior infection. Hepatobiliary: No focal liver abnormality is seen. Cholelithiasis. Unchanged gallbladder mucosal enhancement with no evidence of gallbladder distension. No biliary ductal dilation. Pancreas: Unremarkable. No pancreatic ductal dilatation or surrounding inflammatory changes. Spleen: Normal in size without focal abnormality. Adrenals/Urinary Tract: Adrenal glands are unremarkable. Kidneys are normal, without renal calculi, focal lesion, or hydronephrosis. Mild wall thickening of the urinary bladder which is likely reactive. Stomach/Bowel: Gastrostomy tube is seen in the body of the stomach. 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 Numerous matted loops of small bowel seen in the pelvis with adjacent rim enhancing pelvic fluid collection measuring 6.5 x 3.1 cm on series 3, image 64, unchanged in size when compared with prior exam. More anteriorly located fluid collection which contains a pigtail catheter located on series 3, image 87 unchanged in size when compared with prior exam, measuring approximately 0.8 x 1.6 cm, unchanged when compared with prior exam and remeasured in similar plane. Small collection along the left pelvic sidewall measures up to 2.5 cm on series 3, image 48, previously 2.5 cm. Vascular/Lymphatic: Aortic atherosclerosis. No enlarged abdominal or pelvic lymph nodes. Reproductive: Prior hysterectomy. Other: No ascites or pneumoperitoneum. Stable appearance of midline wound, compatible with history of colocutaneous fistula. Musculoskeletal: Unchanged severe compression deformity of L1. Moderate to severe degenerative disc disease at L3-L4. No aggressive appearing osseous lesions. IMPRESSION: 1. Rim enhancing fluid collections of the pelvis are unchanged in size when compared with prior  exam, more anteriorly located collection contains a pigtail drainage catheter. 2. 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 Numerous matted and thick-walled loops of large and small bowel small bowel seen in the pelvis, similar to prior exam. 3. Small left pleural effusion. 4. Cholelithiasis. Electronically Signed   By: Yetta Glassman M.D.   On: 11/17/2021 12:38   ? ?Anti-infectives: ?Anti-infectives (From admission, onward)  ? ? Start     Dose/Rate Route Frequency Ordered Stop  ? 11/14/21 0800  piperacillin-tazobactam (ZOSYN) IVPB 3.375 g       ? 3.375 g ?12.5 mL/hr over 240 Minutes Intravenous Every 8 hours 11/14/21 0739    ? 11/04/21 1000  piperacillin-tazobactam (ZOSYN) IVPB 3.375 g       ? 3.375 g ?12.5 mL/hr over 240 Minutes Intravenous Every 8 hours 11/04/21 0935 11/11/21 1312  ? 10/27/21 1600  cefTRIAXone (ROCEPHIN) 2 g in sodium chloride 0.9 % 100 mL IVPB  Status:  Discontinued       ? 2 g ?200 mL/hr over 30 Minutes Intravenous Every 24 hours 10/27/21 0843 10/27/21 1106  ? 10/27/21 1600  piperacillin-tazobactam (ZOSYN) IVPB 3.375 g       ? 3.375 g ?12.5 mL/hr over 240 Minutes Intravenous Every 8 hours 10/27/21 1106 11/03/21 2000  ? 10/27/21 1400  metroNIDAZOLE (FLAGYL) IVPB 500 mg  Status:  Discontinued       ? 500 mg ?100 mL/hr over 60 Minutes Intravenous Every 12 hours 10/27/21 0843 10/27/21 1106  ? 10/24/21 1600  piperacillin-tazobactam (ZOSYN) IVPB 3.375 g  Status:  Discontinued       ? 3.375 g ?12.5 mL/hr over 240 Minutes Intravenous Every 8 hours 10/24/21 1422 10/27/21 0843  ? 10/24/21 1000  cefTRIAXone (ROCEPHIN) 2 g in sodium chloride 0.9 % 100 mL IVPB  Status:  Discontinued       ? 2 g ?200 mL/hr over 30 Minutes Intravenous Every 24 hours 10/24/21 0747 10/24/21 1357  ? 10/23/21 1530  vancomycin (VANCOCIN) IVPB 1000 mg/200 mL premix       ? 1,000 mg ?200 mL/hr over 60 Minutes Intravenous  Once 10/23/21 1439 10/23/21 1547  ? 10/23/21 0200  ceFEPIme (MAXIPIME) 2 g in sodium chloride 0.9 %  100 mL IVPB  Status:  Discontinued       ? 2 g ?200 mL/hr over 30 Minutes Intravenous Every 12 hours 10/22/21 2032 10/24/21 0747  ? 10/22/21 2200  cefOXitin (MEFOXIN) 2 g in sodium chloride 0.9 % 100 mL IVPB  Status:  Discontinued       ? 2 g ?200 mL/hr over 30 Minutes Intravenous Every 8 hours 10/22/21 1706 10/22/21 2032  ? 10/22/21 1956  vancomycin variable dose per unstable renal function (pharmacist dosing)  Status:  Discontinued       ?  Does not apply See admin instructions 10/22/21 1957 10/23/21 1917  ? 10/22/21 1245  vancomycin (VANCOCIN) IVPB 1000 mg/200 mL premix       ? 1,000 mg ?200 mL/hr over 60 Minutes Intravenous  Once 10/22/21 1241 10/22/21 1402  ? 10/22/21 1245  ceFEPIme (MAXIPIME) 2 g in sodium chloride 0.9 % 100 mL IVPB       ? 2 g ?200 mL/hr over 30 Minutes Intravenous  Once 10/22/21 1241 10/22/21 1321  ? 10/22/21 1230  metroNIDAZOLE (FLAGYL) IVPB 500 mg  Status:  Discontinued       ? 500 mg ?100 mL/hr over 60 Minutes Intravenous Every 12 hours 10/22/21 1222 10/24/21 1357  ? ?  ? ? ?  Assessment/Plan: ?s/p Procedure(s): ?EXPLORATORY LAPAROTOMY hartmans (N/A) ?Continue clears ?POD#28 - PERFORATED SIGMOID COLON - status post EX LAP, HARTMAN'S PROCEDURE, Gastrostomy tube placement - 04/01/3006 Dr. Leighton Ruff; ?- path with diverticulitis, no malignancy ?OR FINDINGS: Purulent ascites throughout the abdomen and stool contamination in the pelvis due to necrotic perforated sigmoid colon ?- Status post drainage of LLQ fluid collection by IR - 4/22, Cx with pseudomonas; drain adjusted and upsized 5/10 ?- Midline wound with colocutaneous fistula. Continue Eakins pouch. WOCN following for colostomy and pouching of fistula ?- CT 5/4 also with pelvic fluid collection measuring 6 x 3.2 cm. Discussed with IR and not felt amenable/safe for drainage. IV abx. Repeat CT 5/11 w/ stable pelvic abscess that remains unable to be safely accessed per IR (Dr. Kathlene Cote) ?-  G-tube clamped 5/7. Patient tolerating. Tolerating  ice chips/sips. SLP following, currently sips of clears with tsp - ok to advance diet if she tolerates from a CCS perspective. Per SLP ?  ?FEN - G-tube capped, TPN, CLD per tsp per SLP ?VTE - SCDs, SQH ?I

## 2021-11-20 LAB — CBC WITH DIFFERENTIAL/PLATELET
Abs Immature Granulocytes: 0.04 10*3/uL (ref 0.00–0.07)
Basophils Absolute: 0 10*3/uL (ref 0.0–0.1)
Basophils Relative: 0 %
Eosinophils Absolute: 0.4 10*3/uL (ref 0.0–0.5)
Eosinophils Relative: 6 %
HCT: 24.4 % — ABNORMAL LOW (ref 36.0–46.0)
Hemoglobin: 8.1 g/dL — ABNORMAL LOW (ref 12.0–15.0)
Immature Granulocytes: 1 %
Lymphocytes Relative: 28 %
Lymphs Abs: 2 10*3/uL (ref 0.7–4.0)
MCH: 29.3 pg (ref 26.0–34.0)
MCHC: 33.2 g/dL (ref 30.0–36.0)
MCV: 88.4 fL (ref 80.0–100.0)
Monocytes Absolute: 0.5 10*3/uL (ref 0.1–1.0)
Monocytes Relative: 7 %
Neutro Abs: 4.2 10*3/uL (ref 1.7–7.7)
Neutrophils Relative %: 58 %
Platelets: 229 10*3/uL (ref 150–400)
RBC: 2.76 MIL/uL — ABNORMAL LOW (ref 3.87–5.11)
RDW: 15.8 % — ABNORMAL HIGH (ref 11.5–15.5)
WBC: 7.2 10*3/uL (ref 4.0–10.5)
nRBC: 0 % (ref 0.0–0.2)

## 2021-11-20 LAB — COMPREHENSIVE METABOLIC PANEL
ALT: 22 U/L (ref 0–44)
AST: 19 U/L (ref 15–41)
Albumin: 1.9 g/dL — ABNORMAL LOW (ref 3.5–5.0)
Alkaline Phosphatase: 101 U/L (ref 38–126)
Anion gap: 8 (ref 5–15)
BUN: 35 mg/dL — ABNORMAL HIGH (ref 6–20)
CO2: 21 mmol/L — ABNORMAL LOW (ref 22–32)
Calcium: 8.1 mg/dL — ABNORMAL LOW (ref 8.9–10.3)
Chloride: 103 mmol/L (ref 98–111)
Creatinine, Ser: 0.84 mg/dL (ref 0.44–1.00)
GFR, Estimated: >60 mL/min (ref >60)
Glucose, Bld: 114 mg/dL — ABNORMAL HIGH (ref 70–99)
Potassium: 4.2 mmol/L (ref 3.5–5.1)
Sodium: 132 mmol/L — ABNORMAL LOW (ref 135–145)
Total Bilirubin: 0.4 mg/dL (ref 0.3–1.2)
Total Protein: 5.8 g/dL — ABNORMAL LOW (ref 6.5–8.1)

## 2021-11-20 LAB — GLUCOSE, CAPILLARY
Glucose-Capillary: 120 mg/dL — ABNORMAL HIGH (ref 70–99)
Glucose-Capillary: 122 mg/dL — ABNORMAL HIGH (ref 70–99)
Glucose-Capillary: 127 mg/dL — ABNORMAL HIGH (ref 70–99)
Glucose-Capillary: 134 mg/dL — ABNORMAL HIGH (ref 70–99)

## 2021-11-20 LAB — MAGNESIUM: Magnesium: 1.9 mg/dL (ref 1.7–2.4)

## 2021-11-20 LAB — PHOSPHORUS: Phosphorus: 3.1 mg/dL (ref 2.5–4.6)

## 2021-11-20 MED ORDER — HYDRALAZINE HCL 20 MG/ML IJ SOLN
5.0000 mg | Freq: Four times a day (QID) | INTRAMUSCULAR | Status: DC
  Administered 2021-11-20 – 2021-11-23 (×12): 5 mg via INTRAVENOUS
  Filled 2021-11-20 (×13): qty 1

## 2021-11-20 MED ORDER — TRAVASOL 10 % IV SOLN
INTRAVENOUS | Status: AC
  Filled 2021-11-20: qty 1252.8

## 2021-11-20 NOTE — Progress Notes (Signed)
29 Days Post-Op  ? ?Subjective/Chief Complaint: ?No complaints. Had some leak from one of the bags last night ? ? ?Objective: ?Vital signs in last 24 hours: ?Temp:  [97.5 ?F (36.4 ?C)-98.7 ?F (37.1 ?C)] 98.7 ?F (37.1 ?C) (05/14 0330) ?Pulse Rate:  [93-107] 93 (05/14 0330) ?Resp:  [19-23] 19 (05/13 1654) ?BP: (147-173)/(76-94) 147/79 (05/14 0330) ?SpO2:  [98 %-100 %] 99 % (05/14 0330) ?Last BM Date :  (ostomy) ? ?Intake/Output from previous day: ?05/13 0701 - 05/14 0700 ?In: 3263.1 [P.O.:360; I.V.:2239; Blood:298; IV Piggyback:366.2] ?Out: 2575 [Urine:2300; Drains:275] ?Intake/Output this shift: ?No intake/output data recorded. ? ?General appearance: alert and cooperative ?Resp: clear to auscultation bilaterally ?Cardio: regular rate and rhythm ?GI: soft, appropriately tender. Ostomy and eakins pouch intact ? ?Lab Results:  ?Recent Labs  ?  11/19/21 ?9509 11/19/21 ?1745 11/20/21 ?0426  ?WBC 7.8  --  7.2  ?HGB 6.9* 8.4* 8.1*  ?HCT 20.6* 25.2* 24.4*  ?PLT 232  --  229  ? ?BMET ?Recent Labs  ?  11/19/21 ?3267 11/20/21 ?0426  ?NA 127* 132*  ?K 3.7 4.2  ?CL 100 103  ?CO2 20* 21*  ?GLUCOSE 117* 114*  ?BUN 37* 35*  ?CREATININE 0.78 0.84  ?CALCIUM 7.8* 8.1*  ? ?PT/INR ?No results for input(s): LABPROT, INR in the last 72 hours. ?ABG ?No results for input(s): PHART, HCO3 in the last 72 hours. ? ?Invalid input(s): PCO2, PO2 ? ?Studies/Results: ?No results found. ? ?Anti-infectives: ?Anti-infectives (From admission, onward)  ? ? Start     Dose/Rate Route Frequency Ordered Stop  ? 11/14/21 0800  piperacillin-tazobactam (ZOSYN) IVPB 3.375 g       ? 3.375 g ?12.5 mL/hr over 240 Minutes Intravenous Every 8 hours 11/14/21 0739    ? 11/04/21 1000  piperacillin-tazobactam (ZOSYN) IVPB 3.375 g       ? 3.375 g ?12.5 mL/hr over 240 Minutes Intravenous Every 8 hours 11/04/21 0935 11/11/21 1312  ? 10/27/21 1600  cefTRIAXone (ROCEPHIN) 2 g in sodium chloride 0.9 % 100 mL IVPB  Status:  Discontinued       ? 2 g ?200 mL/hr over 30 Minutes  Intravenous Every 24 hours 10/27/21 0843 10/27/21 1106  ? 10/27/21 1600  piperacillin-tazobactam (ZOSYN) IVPB 3.375 g       ? 3.375 g ?12.5 mL/hr over 240 Minutes Intravenous Every 8 hours 10/27/21 1106 11/03/21 2000  ? 10/27/21 1400  metroNIDAZOLE (FLAGYL) IVPB 500 mg  Status:  Discontinued       ? 500 mg ?100 mL/hr over 60 Minutes Intravenous Every 12 hours 10/27/21 0843 10/27/21 1106  ? 10/24/21 1600  piperacillin-tazobactam (ZOSYN) IVPB 3.375 g  Status:  Discontinued       ? 3.375 g ?12.5 mL/hr over 240 Minutes Intravenous Every 8 hours 10/24/21 1422 10/27/21 0843  ? 10/24/21 1000  cefTRIAXone (ROCEPHIN) 2 g in sodium chloride 0.9 % 100 mL IVPB  Status:  Discontinued       ? 2 g ?200 mL/hr over 30 Minutes Intravenous Every 24 hours 10/24/21 0747 10/24/21 1357  ? 10/23/21 1530  vancomycin (VANCOCIN) IVPB 1000 mg/200 mL premix       ? 1,000 mg ?200 mL/hr over 60 Minutes Intravenous  Once 10/23/21 1439 10/23/21 1547  ? 10/23/21 0200  ceFEPIme (MAXIPIME) 2 g in sodium chloride 0.9 % 100 mL IVPB  Status:  Discontinued       ? 2 g ?200 mL/hr over 30 Minutes Intravenous Every 12 hours 10/22/21 2032 10/24/21 0747  ?  10/22/21 2200  cefOXitin (MEFOXIN) 2 g in sodium chloride 0.9 % 100 mL IVPB  Status:  Discontinued       ? 2 g ?200 mL/hr over 30 Minutes Intravenous Every 8 hours 10/22/21 1706 10/22/21 2032  ? 10/22/21 1956  vancomycin variable dose per unstable renal function (pharmacist dosing)  Status:  Discontinued       ?  Does not apply See admin instructions 10/22/21 1957 10/23/21 1917  ? 10/22/21 1245  vancomycin (VANCOCIN) IVPB 1000 mg/200 mL premix       ? 1,000 mg ?200 mL/hr over 60 Minutes Intravenous  Once 10/22/21 1241 10/22/21 1402  ? 10/22/21 1245  ceFEPIme (MAXIPIME) 2 g in sodium chloride 0.9 % 100 mL IVPB       ? 2 g ?200 mL/hr over 30 Minutes Intravenous  Once 10/22/21 1241 10/22/21 1321  ? 10/22/21 1230  metroNIDAZOLE (FLAGYL) IVPB 500 mg  Status:  Discontinued       ? 500 mg ?100 mL/hr over 60 Minutes  Intravenous Every 12 hours 10/22/21 1222 10/24/21 1357  ? ?  ? ? ?Assessment/Plan: ?s/p Procedure(s): ?EXPLORATORY LAPAROTOMY hartmans (N/A) ?Continue clears ?POD#29 - PERFORATED SIGMOID COLON - status post EX LAP, HARTMAN'S PROCEDURE, Gastrostomy tube placement - 9/52/8413 Dr. Leighton Ruff; ?- path with diverticulitis, no malignancy ?OR FINDINGS: Purulent ascites throughout the abdomen and stool contamination in the pelvis due to necrotic perforated sigmoid colon ?- Status post drainage of LLQ fluid collection by IR - 4/22, Cx with pseudomonas; drain adjusted and upsized 5/10 ?- Midline wound with colocutaneous fistula. Continue Eakins pouch. WOCN following for colostomy and pouching of fistula ?- CT 5/4 also with pelvic fluid collection measuring 6 x 3.2 cm. Discussed with IR and not felt amenable/safe for drainage. IV abx. Repeat CT 5/11 w/ stable pelvic abscess that remains unable to be safely accessed per IR (Dr. Kathlene Cote) ?-  G-tube clamped 5/7. Patient tolerating. Tolerating ice chips/sips. SLP following, currently sips of clears with tsp - ok to advance diet if she tolerates from a CCS perspective. Per SLP ?  ?FEN - G-tube capped, TPN, CLD per tsp per SLP ?VTE - SCDs, SQH ?ID - zosyn 4/17 - 4/27; 4/28-5/6, 5/8 >> ?  ?Continue IV abx - do not have source control at this time, pelvic abscess not amenable to drainage. Advance diet per SLP and monitor drain/fistula output. No emergent surgical needs. Continue to try to avoid surgery if possible as she is only 4 weeks out from her last operation - her abdomen would be scarred in and high risk for complications, not to mention she has not had any enteral nutrition since surgery.  We will continue to follow closely. Check prealbumin in AM. ? LOS: 29 days  ? ? ?Jessica [name] III ?11/20/2021 ? ?

## 2021-11-20 NOTE — Progress Notes (Signed)
?PROGRESS NOTE ? ? ? ?Jessica [name]  YQM:[id number] DOB: 12/17/1960 DOA: 10/22/2021 ?PCP: Deland Pretty, MD  ? ? ? ?Brief Narrative:  ?Jessica [name] is an 61 y.o. female past medical history significant for alcohol abuse, with admissions for severe alcohol withdrawals requiring ICU admissions tobacco use presented to the ED on 10/22/2021 diffuse abdominal pain that started the day prior to admission.  CT of the abdomen pelvis showed pneumoperitoneum and free fluid, severe sepsis with started on broad-spectrum antibiotics fluid resuscitated surgery was consulted was taken emergently to the OR underwent exploratory laparotomy and was found to have a perforated sigmoid colon, status post Hartmann procedure and insertion of the G-tube which was left to drain to gravity, JP in pelvis was noted to have feculent peritonitis in the pelvis.  ICU course was complicated by Klebsiella bacteremia profound caloric protein malnutrition requiring TPN, acute respiratory failure with hypoxia and tachycardia with failure to progress ? ? ?Subjective: ?5/14 afebrile overnight A/O x4 patient states she was just too tired yesterday to participate with speech evaluation. ? ? ?Assessment & Plan: ?Covid vaccination; ?  ?Principal Problem: ?  Severe sepsis due to perforated sigmoid colon s/p Hartmann/colostomy 10/22/2021 ?Active Problems: ?  Acute respiratory failure with hypoxia ([place]) ?  Acute metabolic encephalopathy ?  Oropharyngeal dysphagia ?  Chronic pain ?  Tobacco use disorder ?  Protein-calorie malnutrition, severe ([place]) ?  AKI (acute kidney injury) ([place]) ?  Hypernatremia, hyponatremia, hypokalemia, hyperphosphatemia ?  Elevated brain natriuretic peptide (BNP) level ?  Right proximal humeral fracture ?  Normocytic anemia ?  Substance induced mood disorder ([place]) ?  Anxiety ?  Primary insomnia ?  Underweight ?  Colostomy in place [facility]) ?  Stricture and stenosis of esophagus ?  Gastrostomy tube in place [facility]) ?  Hyponatremia ?   Palliative care by specialist ?  Goals of care, counseling/discussion ?  General weakness ?  Septic shock ([place]) ?  Postoperative intra-abdominal abscess ?  Colocutaneous fistula ?  Generalized abdominal pain ?  Aspiration pneumonia ([place]) ?  Bacteremia due to Klebsiella pneumoniae ?  Acute encephalopathy ?  Humerus fracture ? ?Severe septic shock due to perforated sigmoid colon  ?-4/15/ 2023 s/p Hartmann/colostomy  ?-10/21/2021 s/p G-tube placement.  Complicated by colocutaneous fistula with Pseudomonas intra-abdominal leak requiring percutaneous drainage: ?-10/29/2021 lower quadrant drain placed: Managed per IR ?Repeated CT scan on 11/10/2031 showed new pelvic collection not amenable to drainage likely an abscess. ?Patient is febrile, with a Tmax of 100.9. ?-CT on 10/10/2021 showed pelvic fluid collection not amenable to drainage.  Lesion abscess. ?Keep the patient n.p.o. continue NG tube and TNA and antibiotics per surgery. ?-Antibiotics were resumed on 11/14/2021. ?-5/10 s/p fluoro guided exchange up-sizing and repositioning of now 14 Fr drainage catheter into peri-stomal collection yielding feculent debris ?-5/11 CT abdomen and pelvis showing continued abscesses/fluid collection.  Will discuss in a.m. with CCS if now amenable to drainage ?-5/12 discussed case with PA Obie Dredge CCS and concurs that surgery at this time would be too high risk.  We will continue with antibiotics. ?- 5/13 discussed case with Dr. Jule Ser ID.  Secondary to complexity of case will evaluate patient today or tomorrow, will await recommendations.  ? ?Septic shock/Aspiration pneumonia/Abdominal infection: ?-Has been weaned off pressors  ?-Strict in and out +13.6 L ?- Daily weight ?Filed Weights  ? 11/16/21 0500 11/18/21 0500 11/19/21 0330  ?Weight: 54.3 kg 55.3 kg 59.9 kg  ?   ? ?Acute respiratory failure with  hypoxia/Aspiration pneumonia: ?-Extubated on 10/27/2021 due to altered mental status had to be reintubated on  11/07/2021. ?-Extubated on 11/08/2021. ?-Currently n.p.o. ,now on room air. ?-Physical therapy is on board. Will need SNF vs LTAC. ?   ?Klebsiella bacteremia ?-Blood cultures on 10/23/2018 ?-Initially placed on vancomycin Flagyl and cefepime on 10/22/2021 ?-De-escalated to IV Zosyn 10/24/2021, she has now completed 2-week course of IV antibiotics for bacteremia. ?-Continue IV Zosyn per surgery for intra-abdominal infection ? ?Acute encephalopathy  ?-Multifactorial  ICU delirium, infectious etiology, EtOH withdrawal: ?-5/10 resolved . ?-Continue low-dose fentanyl for pain. ?-Limit sedating medication. ?-5/11 resolved ? ?AKI ?-Secondary to severe septic shock: ?-Was on IV diuresis, diuretics were held she is positive about 9 L. ? ?Lab Results  ?Component Value Date  ? CREATININE 0.84 11/20/2021  ? CREATININE 0.78 11/19/2021  ? CREATININE 0.90 11/18/2021  ? CREATININE 0.80 11/17/2021  ? CREATININE 0.99 11/16/2021  ?-5/11 resolved ? ?Essential HTN ?- 5/11 Metoprolol IV 5 mg TID ?-5/13 increase Metoprolol IV 5 mg QID ?-5/14 Hydralazine IV 5 mg QID ?   ?Normocytic anemia: ?-4/22 transfuse 1 unit PRBC ?-5/3 transfuse 1 unit PRBC ?-5/10 anemia panel pending ?Lab Results  ?Component Value Date  ? HGB 8.1 (L) 11/20/2021  ? HGB 8.4 (L) 11/19/2021  ? HGB 6.9 (LL) 11/19/2021  ? HGB 7.8 (L) 11/18/2021  ? HGB 8.7 (L) 11/11/2021  ?-5/12 transfuse for hemoglobin<7 ?-5/13 transfuse 1 unit PRBC ? ?Severe protein caloric malnutrition/cachexia: ?-Currently on TPN. ?-G-tube placed on 10/22/2021, ?-Speech on board to evaluate. ? ?Oropharyngeal dysphagia: ?-5/11 Speech concurs with continued n.p.o., TPN and antibiotics per surgery.  ?-5/12 patient much more alert and interactive reconsult speech for swallow evaluation.  Recommendations pending  ? ?Right proximal humerus fracture: ?-Status post fall in March 2023 seen by Ortho during that month with plan for nonweightbearing for 1 month then follow-up with them as an  outpatient. ? ?Hypernatremia//hyponatremia: ?Lab Results  ?Component Value Date  ? NA 132 (L) 11/20/2021  ? NA 127 (L) 11/19/2021  ? NA 129 (L) 11/18/2021  ? NA 130 (L) 11/17/2021  ? NA 129 (L) 11/16/2021  ?-Slightly low, asymptomatic. ? ?  ?Hypokalemia ?-Potassium goal> 4 ?-5/10 potassium IV 50 mEq ? ?Hypomagnesmia ?- 5/10 Magnesium goal> 2 ?- 5/10 magnesium IV 3 g ?  ?Substance induced mood disorder (HCC)/ Anxiety ? ? ?Pressure Injury 10/27/21 Vertebral column Mid;Medial Deep Tissue Pressure Injury - Purple or maroon localized area of discolored intact skin or blood-filled blister due to damage of underlying soft tissue from pressure and/or shear. (Active)  ?10/27/21 0428  ?Location: Vertebral column  ?Location Orientation: Mid;Medial  ?Staging: Deep Tissue Pressure Injury - Purple or maroon localized area of discolored intact skin or blood-filled blister due to damage of underlying soft tissue from pressure and/or shear.  ?Wound Description (Comments):   ?Present on Admission: No  ?Dressing Type Foam - Lift dressing to assess site every shift 11/20/21 0830  ?-Continue treatment per wound care ? ?  ? ? ?Mobility Assessment (last 72 hours)   ? ? Mobility Assessment   ? ? [place] Name 11/20/21 0830 11/19/21 2041 11/19/21 1433 11/19/21 0800 11/18/21 2000  ? Does patient have an order for bedrest or is patient medically unstable No - Continue assessment No - Continue assessment -- No - Continue assessment No - Continue assessment  ? What is the highest level of mobility based on the progressive mobility assessment? Level 3 (Stands with assist) - Balance while standing  and cannot march  in place Level 4 (Walks with assist in room) - Balance while marching in place and cannot step forward and back - Complete Level 3 (Stands with assist) - Balance while standing  and cannot march in place Level 3 (Stands with assist) - Balance while standing  and cannot march in place Level 3 (Stands with assist) - Balance while standing   and cannot march in place  ? Is the above level different from baseline mobility prior to current illness? Yes - Recommend PT order Yes - Recommend PT order -- Yes - Recommend PT order Yes - Recommend PT order  ? ? [place] Name 11/18/21 1500 11/18/21 0804 05/11/2

## 2021-11-20 NOTE — Progress Notes (Signed)
PHARMACY - TOTAL PARENTERAL NUTRITION CONSULT NOTE  ? ?Indication: Prolonged ileus ? ?Patient Measurements: ?Height: '5\' 10"'  (177.8 cm) ?Weight: 59.9 kg (132 lb 0.9 oz) ?IBW/kg (Calculated) : 68.5 ?TPN AdjBW (KG): 53.8 ?Body mass index is 18.95 kg/m?. ?Usual Weight: 53.8 kg ? ?Assessment: 61 yo F w/ PMH etoh use disorder, smoker, HTN, GERD. S/p Exp Lap Hartmans 10/22/2021 for perforated sigmoid colon, Dr. Marcello Moores.   ? ?Glucose / Insulin: no hx DM.  CBGs continue to be <150.  (insulin d/c 5/1) ?Electrolytes: Na (132) remains low, but increasing with increased Na concentration in TPN. CorrCa (9.8) on upper end of range. All other lytes WNL ?-Bicarb remains slightly low with change of Cl:Ac to 1:2 ?Vitamins:  Vitamin C <0.1 (4/22); receiving 200 mg ascorbic acid daily in TPN from MVI ?Vitamin A 14.2 (4/22); receiving 1 mg of vitamin A daily in TPN from MVI ?Unable to add additional ascorbic acid and vitamin A to TPN per TPN policy ?5/5 - initiated ascorbic acid 500 mg per tube BID and vitamin A 10,000 units per tube daily. Tube to be clamped x1 hour after administration then returned to suction.  ?Renal: SCr WNL; BUN still elevated but trending down; UOP good ?Hepatic: LFTs, Tbili, Alk Phos WNL; TG stable WNL. Albumin remains low ?Intake / Output: Strict I/O not measured.  ?-UOP: 2300 mL + 1 unmeasured. Drain output: 275 mL. Colostomy: no output charted/24 hrs ?-No mIVF ?GI Imaging:  ?4/15 CTA: bowel perforation ?4/17 KUB ileus or pSBO ?4/20 CTA: severe ileus or pSBO ?5/04 CT: new pelvic collection not amenable to drainage, likely an abscess ?5/11 CTa/p: fluid collections unchanged from 5/4 ?GI Surgeries / Procedures:  ?10/22/21 s/p EXPLORATORY LAPAROTOMY hartmans for perforated sigmoid colon ?4/23 IR placed drain LLQ ?5/10 Drainage cath exchanged ? ?Central access: CVC TL placed 10/23/21 ?TPN start date: 10/26/21 ? ?Nutritional Goals: ?Goal TPN rate is 90 mL/hr (provides 125 g of protein and ~ 2060 kcals per day)  ? ?RD  Assessment: (5/10) ?Estimated Needs ?Total Energy Estimated Needs: 2000-2200 kcal ?Total Protein Estimated Needs: 105-125 grams ?Total Fluid Estimated Needs: >/= 2.2 L/day ? ?Current Nutrition:  ?TPN ?Diet advanced from NPO to CLD on 5/12 ?G-tube clamped 5/7. Tolerating ice chips/sips. SLP following ? ?Plan:  ? ?At 18:00:   ?Continue TPN @  90 mL/hr to provide 100% of goals ?Electrolytes in TPN: Increase Na, decrease Ca ?Na 140 mEq/L ?K  50 mEq/L ?Ca 3 mEq/L ?Mg 8 mEq/L ?Phos 5 mmol/L ?Cl:Ac 1:2  ?Continue standard MVI and trace elements to TPN ?Continue folic acid & thiamine in TPN ?Monitor TPN labs on Mon/Thurs ?Follow up enteral intake for appropriate wean off TPN   ? ?Lenis Noon, PharmD, BCPS ?Clinical Pharmacist ?11/20/2021 8:41 AM ?

## 2021-11-21 LAB — TYPE AND SCREEN
ABO/RH(D): O POS
Antibody Screen: NEGATIVE
Unit division: 0

## 2021-11-21 LAB — COMPREHENSIVE METABOLIC PANEL
ALT: 21 U/L (ref 0–44)
AST: 19 U/L (ref 15–41)
Albumin: 1.9 g/dL — ABNORMAL LOW (ref 3.5–5.0)
Alkaline Phosphatase: 97 U/L (ref 38–126)
Anion gap: 7 (ref 5–15)
BUN: 32 mg/dL — ABNORMAL HIGH (ref 6–20)
CO2: 23 mmol/L (ref 22–32)
Calcium: 7.6 mg/dL — ABNORMAL LOW (ref 8.9–10.3)
Chloride: 99 mmol/L (ref 98–111)
Creatinine, Ser: 0.67 mg/dL (ref 0.44–1.00)
GFR, Estimated: >60 mL/min (ref >60)
Glucose, Bld: 116 mg/dL — ABNORMAL HIGH (ref 70–99)
Potassium: 3.8 mmol/L (ref 3.5–5.1)
Sodium: 129 mmol/L — ABNORMAL LOW (ref 135–145)
Total Bilirubin: 0.6 mg/dL (ref 0.3–1.2)
Total Protein: 5.7 g/dL — ABNORMAL LOW (ref 6.5–8.1)

## 2021-11-21 LAB — CBC WITH DIFFERENTIAL/PLATELET
Abs Immature Granulocytes: 0.03 10*3/uL (ref 0.00–0.07)
Basophils Absolute: 0 10*3/uL (ref 0.0–0.1)
Basophils Relative: 0 %
Eosinophils Absolute: 0.4 10*3/uL (ref 0.0–0.5)
Eosinophils Relative: 5 %
HCT: 24.3 % — ABNORMAL LOW (ref 36.0–46.0)
Hemoglobin: 8.2 g/dL — ABNORMAL LOW (ref 12.0–15.0)
Immature Granulocytes: 0 %
Lymphocytes Relative: 34 %
Lymphs Abs: 2.6 10*3/uL (ref 0.7–4.0)
MCH: 29.7 pg (ref 26.0–34.0)
MCHC: 33.7 g/dL (ref 30.0–36.0)
MCV: 88 fL (ref 80.0–100.0)
Monocytes Absolute: 0.5 10*3/uL (ref 0.1–1.0)
Monocytes Relative: 7 %
Neutro Abs: 4 10*3/uL (ref 1.7–7.7)
Neutrophils Relative %: 54 %
Platelets: 233 10*3/uL (ref 150–400)
RBC: 2.76 MIL/uL — ABNORMAL LOW (ref 3.87–5.11)
RDW: 16 % — ABNORMAL HIGH (ref 11.5–15.5)
WBC: 7.6 10*3/uL (ref 4.0–10.5)
nRBC: 0 % (ref 0.0–0.2)

## 2021-11-21 LAB — TRIGLYCERIDES: Triglycerides: 105 mg/dL (ref <150)

## 2021-11-21 LAB — PHOSPHORUS: Phosphorus: 3.2 mg/dL (ref 2.5–4.6)

## 2021-11-21 LAB — MAGNESIUM: Magnesium: 1.6 mg/dL — ABNORMAL LOW (ref 1.7–2.4)

## 2021-11-21 LAB — BPAM RBC
Blood Product Expiration Date: 202306132359
ISSUE DATE / TIME: 202305131408
Unit Type and Rh: 5100

## 2021-11-21 LAB — GLUCOSE, CAPILLARY
Glucose-Capillary: 134 mg/dL — ABNORMAL HIGH (ref 70–99)
Glucose-Capillary: 125 mg/dL — ABNORMAL HIGH (ref 70–99)

## 2021-11-21 MED ORDER — ACETAMINOPHEN 325 MG PO TABS
650.0000 mg | ORAL_TABLET | Freq: Once | ORAL | Status: AC
  Administered 2021-11-21: 650 mg via ORAL
  Filled 2021-11-21: qty 2

## 2021-11-21 MED ORDER — MEGESTROL ACETATE 400 MG/10ML PO SUSP
400.0000 mg | Freq: Two times a day (BID) | ORAL | Status: DC
  Administered 2021-11-21 – 2021-11-29 (×15): 400 mg via ORAL
  Filled 2021-11-21 (×22): qty 10

## 2021-11-21 MED ORDER — TRAVASOL 10 % IV SOLN
INTRAVENOUS | Status: AC
  Filled 2021-11-21: qty 1094.4

## 2021-11-21 MED ORDER — MAGNESIUM SULFATE 2 GM/50ML IV SOLN
2.0000 g | Freq: Once | INTRAVENOUS | Status: AC
  Administered 2021-11-21: 2 g via INTRAVENOUS
  Filled 2021-11-21: qty 50

## 2021-11-21 MED ORDER — MAGNESIUM SULFATE IN D5W 1-5 GM/100ML-% IV SOLN
1.0000 g | Freq: Once | INTRAVENOUS | Status: AC
  Administered 2021-11-21: 1 g via INTRAVENOUS
  Filled 2021-11-21: qty 100

## 2021-11-21 NOTE — Progress Notes (Signed)
? ?                                                                                                                                                     ?                                                   ?Daily Progress Note  ? ?Patient Name: Jessica [name]       Date: 11/21/2021 ?DOB: 12/02/1960  Age: 61 y.o. MRN#: [id number] ?Attending Physician: Allie Bossier, MD ?Primary Care Physician: Deland Pretty, MD ?Admit Date: 10/22/2021 ? ?Reason for Consultation/Follow-up: Establishing goals of care ? ?Subjective: ?Patient is resting in bed, denies any complaints, chart reviewed, she has been started on soft diet.  Remains on antibiotics, has drains and is on wound care. ?  ? ?Length of Stay: 30 ? ?Current Medications: ?Scheduled Meds:  ? sodium chloride   Intravenous Once  ? vitamin C  500 mg Per Tube BID  ? chlorhexidine  15 mL Mouth Rinse BID  ? Chlorhexidine Gluconate Cloth  6 each Topical Daily  ? feeding supplement  1 Container Oral BID BM  ? heparin injection (subcutaneous)  5,000 Units Subcutaneous Q8H  ? hydrALAZINE  5 mg Intravenous Q6H  ? mouth rinse  15 mL Mouth Rinse q12n4p  ? metoprolol tartrate  5 mg Intravenous Q6H  ? nicotine  14 mg Transdermal Daily  ? pantoprazole (PROTONIX) IV  40 mg Intravenous Q24H  ? sodium chloride flush  10-40 mL Intracatheter Q12H  ? sodium chloride flush  5 mL Intracatheter Q12H  ? sodium chloride flush  5 mL Intracatheter Q8H  ? vitamin A  10,000 Units Per Tube Daily  ? ? ?Continuous Infusions: ? sodium chloride 10 mL/hr at 11/14/21 1900  ? magnesium sulfate bolus IVPB    ? piperacillin-tazobactam (ZOSYN)  IV 3.375 g (11/21/21 0912)  ? TPN ADULT (ION) 90 mL/hr at 11/20/21 1743  ? TPN ADULT (ION)    ? ? ?PRN Meds: ?acetaminophen, antiseptic oral rinse, diphenhydrAMINE, fentaNYL (SUBLIMAZE) injection, hydrALAZINE, ipratropium-albuterol, lidocaine, lip balm, LORazepam, ondansetron (ZOFRAN) IV, sodium chloride, sodium chloride flush ? ?Physical Exam         ?Appears chronically  ill and frail ?Awake and alert ?Has ostomy ?Appears with generalized weakness ? ?Vital Signs: BP (!) (P) 146/76 (BP Location: Right Arm)   Pulse (!) (P) 108   Temp (P) 99.6 ?F (37.6 ?C)   Resp 20   Ht '5\' 10"'$  (1.778 m)   Wt 60.8 kg   SpO2 (P) 97%   BMI 19.23 kg/m?  ?SpO2: SpO2: (P) 97 % ?O2 Device: O2 Device: (P) Room Air ?O2  Flow Rate: O2 Flow Rate (L/min): 2 L/min ? ?Intake/output summary:  ?Intake/Output Summary (Last 24 hours) at 11/21/2021 1235 ?Last data filed at 11/21/2021 9767 ?Gross per 24 hour  ?Intake 3115.58 ml  ?Output 2220 ml  ?Net 895.58 ml  ? ? ?LBM: Last BM Date :  (ostomy) ?Baseline Weight: Weight: 54 kg ?Most recent weight: Weight: 60.8 kg ? ?     ?Palliative Assessment/Data: ? ? ? ? ? ?Patient Active Problem List  ? Diagnosis Date Noted  ? Aspiration pneumonia ([place]) 11/17/2021  ? Bacteremia due to Klebsiella pneumoniae 11/17/2021  ? Acute encephalopathy 11/17/2021  ? Humerus fracture 11/17/2021  ? Generalized abdominal pain   ? Septic shock ([place]) 11/09/2021  ? Postoperative intra-abdominal abscess 11/09/2021  ? 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 Malnutrition of moderate degree 11/07/2021  ? Palliative care by specialist   ? Goals of care, counseling/discussion   ? General weakness   ? Sinus tachycardia 11/05/2021  ? Normocytic anemia 11/04/2021  ? Acute respiratory failure with hypoxia ([place]) 11/03/2021  ? Acute metabolic encephalopathy 34/19/3790  ? Hypernatremia, hyponatremia, hypokalemia, hyperphosphatemia 11/03/2021  ? Hypokalemia 11/03/2021  ? Hyperphosphatemia 11/03/2021  ? Hyponatremia 11/03/2021  ? Elevated brain natriuretic peptide (BNP) level 11/03/2021  ? Right proximal humeral fracture 11/03/2021  ? Oropharyngeal dysphagia 11/03/2021  ? Colostomy in place [facility]) 10/30/2021  ? Stricture and stenosis of esophagus 10/30/2021  ? Gastrostomy tube in place [facility]) 10/30/2021  ? AKI (acute kidney injury) ([place]) 10/25/2021  ? Anxiety 10/24/2021  ? Chronic pain 10/24/2021  ? Allergic rhinitis  10/24/2021  ? 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 Tobacco use disorder 10/24/2021  ? Underweight 10/24/2021  ? Acquired hammer toe of right foot 10/24/2021  ? Uncontrolled hypertension 10/24/2021  ? Urticaria 10/24/2021  ? Dry mouth 10/24/2021  ? Protein-calorie malnutrition, severe ([place]) 10/24/2021  ? Severe sepsis due to perforated sigmoid colon s/p Hartmann/colostomy 10/22/2021 10/22/2021  ? Substance induced mood disorder ([place])   ? ? ?Palliative Care Assessment & Plan  ? ?Patient Profile: ?  ? ?Assessment: ?61 year old lady with history of alcohol use disorder, tobacco use, recent right proximal humeral fracture, presented to the hospital at this time with diffuse abdominal pain.  Was found to have sepsis sigmoid perforation, was placed on antibiotics and underwent ex lap with Hartman's procedure and G-tube placement on 10-22-2021.  Postop course complicated by patient requiring reintubation and vasopressors on 4-17 and then she was extubated on 4-20.  Periodically has required Precedex due to agitation and hallucinations.  Found to have postop abdominal abscess requiring drainage on 4-22.  Started on TPN due to tube feed intolerance.  Weaned off of vasopressors as well as Precedex.  Hospital course complicated by feculent output from laparotomy site and repeat CT abdomen on 4-28 with new fluid collection within the cul-de-sac.  Remains on IV antibiotics and TPN.  Interventional radiology and general surgery are following.  Palliative medicine team consulted for ongoing goals of care discussions.  At the time of initial consultation patient had endorsed full code/full scope.  She had stated that she had survived her colon bursting inside of her and hence wished to continue with any and all means to get better.  Hospital course complicated by patient having new fluid collection in her abdomen on repeat imaging as well as ongoing weakness.  On 5-10 status post fluoroscopy guided exchange upsizing and repositioning of  drainage catheter into peristomal collection yielding feculent debris. ? ?Recommendations/Plan: ?Continue current mode of care.  Chart reviewed,  SLP following, surgical colleagues following.  Remains admitted to hospital medicine service.  Note plans for starting a soft diet. ?Her boyfriend [name] [place] has been designated as her HCPOA agent as per her wishes, appreciate spiritual care assistance. ?Monitor hospital course, recommend SNF rehab with palliative.  Goals of care rediscussed with patient today.  She wishes to continue current mode of care.  No further palliative medicine team specific recommendations at this time.  We will follow peripherally. ? ?Goals of Care and Additional Recommendations: ?Limitations on Scope of Treatment: Full Scope Treatment ? ?Code Status: ? ?  ?Code Status Orders  ?(From admission, onward)  ?  ? ? ?  ? ?  Start     Ordered  ? 10/22/21 1429  Full code  Continuous       ? 10/22/21 1431  ? ?  ?  ? ?  ? ?Code Status History   ? ? Date Active Date Inactive Code Status Order ID Comments User Context  ? 12/04/2017 2253 12/12/2017 1930 Full Code [id number]  Reyne Dumas, MD ED  ? ?  ? ? ?Prognosis: ? Guarded  ? ?Discharge Planning: ?Recommend skilled nursing facility for rehab with palliative support. ? ?Care plan was discussed with interdisciplinary team ? ?Thank you for allowing the Palliative Medicine Team to assist in the care of this patient. ? ?Loistine Chance, MD ? ?Please contact Palliative Medicine Team phone at [phone] for questions and concerns.  ? ? ? ? ? ?

## 2021-11-21 NOTE — Consult Note (Signed)
?  [facility] for Infectious Disease  ? ? ?Date of Admission:  10/22/2021    ? ?Reason for Consult: Intra-abdominal Infection ?    ?Referring Physician: Dr Sherral Hammers ? ?Current antibiotics: ?Zosyn 4/17-5/5; 5/8 - present ? ?Prior antibiotics: ?Ceftriaxone 4/17 ?Cefepime 4/15 - 4/17 ?Metronidazole 4/15 - 4/17 ?Vancomycin 4/15 - 4/16 ? ? ?ASSESSMENT:   ? ?61 y.o. female admitted with: ? ?Complicated intra-abdominal infection: Patient presented with septic shock due to Klebsiella bacteremia from a perforated sigmoid colon on 4/15.  Status post Hartman/colostomy procedure with G-tube placement at that time.  This was complicated by colocutaneous fistula formation and intra-abdominal abscess.  Status post IR drain placement into this fluid collection 4/22 with cultures growing Pseudomonas.  Another CT scan obtained 5/4 notable for a second pelvic fluid collection measuring 6 x 3.2 cm unable to be drained by IR.  On 5/10, she underwent drain upsize into this peri-stomal collection yielding feculent debris.  This residual abscess cavity was noted to have a persistent fistulous connection with her open midline abdominal wound. ? ? ?RECOMMENDATIONS:   ? ?Continue Zosyn.  Anticipate will be on antibiotics for a prolonged period given uncontrolled intra-abdominal infection with abscess not amenable to drainage.  ?Monitor drain output ?Wound care ?Nutrition ?Lab monitoring ? ? ?Principal Problem: ?  Severe sepsis due to perforated sigmoid colon s/p Hartmann/colostomy 10/22/2021 ?Active Problems: ?  Substance induced mood disorder ([place]) ?  Anxiety ?  Chronic pain ?  Primary insomnia ?  Tobacco use disorder ?  Underweight ?  Protein-calorie malnutrition, severe ([place]) ?  Colostomy in place [facility]) ?  AKI (acute kidney injury) ([place]) ?  Stricture and stenosis of esophagus ?  Gastrostomy tube in place [facility]) ?  Acute respiratory failure with hypoxia ([place]) ?  Acute metabolic encephalopathy ?  Hypernatremia, hyponatremia, hypokalemia,  hyperphosphatemia ?  Hyponatremia ?  Elevated brain natriuretic peptide (BNP) level ?  Right proximal humeral fracture ?  Oropharyngeal dysphagia ?  Normocytic anemia ?  Palliative care by specialist ?  Goals of care, counseling/discussion ?  General weakness ?  Septic shock ([place]) ?  Postoperative intra-abdominal abscess ?  Colocutaneous fistula ?  Generalized abdominal pain ?  Aspiration pneumonia ([place]) ?  Bacteremia due to Klebsiella pneumoniae ?  Acute encephalopathy ?  Humerus fracture ? ? ?MEDICATIONS:   ? ?Scheduled Meds: ? sodium chloride   Intravenous Once  ? vitamin C  500 mg Per Tube BID  ? chlorhexidine  15 mL Mouth Rinse BID  ? Chlorhexidine Gluconate Cloth  6 each Topical Daily  ? feeding supplement  1 Container Oral BID BM  ? heparin injection (subcutaneous)  5,000 Units Subcutaneous Q8H  ? hydrALAZINE  5 mg Intravenous Q6H  ? mouth rinse  15 mL Mouth Rinse q12n4p  ? metoprolol tartrate  5 mg Intravenous Q6H  ? nicotine  14 mg Transdermal Daily  ? pantoprazole (PROTONIX) IV  40 mg Intravenous Q24H  ? sodium chloride flush  10-40 mL Intracatheter Q12H  ? sodium chloride flush  5 mL Intracatheter Q12H  ? sodium chloride flush  5 mL Intracatheter Q8H  ? vitamin A  10,000 Units Per Tube Daily  ? ?Continuous Infusions: ? sodium chloride 10 mL/hr at 11/14/21 1900  ? magnesium sulfate bolus IVPB    ? piperacillin-tazobactam (ZOSYN)  IV 3.375 g (11/21/21 0912)  ? TPN ADULT (ION) 90 mL/hr at 11/20/21 1743  ? TPN ADULT (ION)    ? ?PRN Meds:.acetaminophen, antiseptic oral rinse, diphenhydrAMINE,  fentaNYL (SUBLIMAZE) injection, hydrALAZINE, ipratropium-albuterol, lidocaine, lip balm, LORazepam, ondansetron (ZOFRAN) IV, sodium chloride, sodium chloride flush ? ?HPI:   ? ?Jessica [name] is a 61 y.o. female with a prolonged hospitalization who was admitted April 15 with diffuse abdominal pain starting the day prior to admission.  She had a CT of the abdomen/pelvis showing pneumoperitoneum and free fluid.  She was  taken emergently to the OR and underwent exploratory laparotomy where she was found to have a perforated sigmoid colon.  She had Klebsiella bacteremia.  She is status post Hartmann procedure and insertion of G-tube which was left to drain to gravity. ? ?This was complicated by a development of the left lower quadrant fluid collection status post IR drainage 4/22.  Cultures from her drain grew Pseudomonas.  This required having her drain upsized to 5/10 with fluid that appeared feculent.  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  She had a repeat CT scan 5/4 with a pelvic fluid collection measuring 6 x 3.2 cm.  This was discussed with IR and it was not felt safe or amenable to drainage.  She was continued on IV antibiotics.  A repeat CT scan showed stable pelvic abscess also remaining unable to be drained.  She has been on Zosyn for quite some time and is overall stable.  We have been consulted for further antibiotic recommendations. ? ? ?Past Medical History:  ?Diagnosis Date  ? Bacteremia due to Gram-negative bacteria 11/03/2021  ? Dehydration 11/05/2021  ? Delirium tremens ([place]) 12/04/2017  ? Hypernatremia 11/03/2021  ? Thrombocytopenia ([place]) 11/03/2021  ? ? ?Social History  ? ?Tobacco Use  ? Smoking status: Every Day  ?  Packs/day: 1.00  ?  Types: Cigarettes  ? Smokeless tobacco: Never  ?Substance Use Topics  ? Alcohol use: Yes  ?  Comment: answer varies at present "1 bottle a night" or " 3-4 glasses of wine"  ? Drug use: Never  ? ? ?History reviewed. No pertinent family history. ? ?No Known Allergies ? ?Review of Systems  ?All other systems reviewed and are negative. Except as noted in HPI.  ? ?OBJECTIVE:  ? ?Blood pressure (!) (P) 146/76, pulse (!) (P) 108, temperature (P) 99.6 ?F (37.6 ?C), resp. rate 20, height '5\' 10"'$  (1.778 m), weight 60.8 kg, SpO2 (P) 97 %. ?Body mass index is 19.23 kg/m?. ? ?Physical Exam ?Constitutional:   ?   General: She is not in  acute distress. ?   Appearance: Normal appearance.  ?   Comments: She is lying in bed.  Husband and therapy are at the bedside.   ?HENT:  ?   Head: Normocephalic and atraumatic.  ?Eyes:  ?   Extraocular Movements: Extraocular movements intact.  ?   Conjunctiva/sclera: Conjunctivae normal.  ?Pulmonary:  ?   Effort: Pulmonary effort is normal. No respiratory distress.  ?Abdominal:  ?   General: There is no distension.  ?   Palpations: Abdomen is soft.  ?Musculoskeletal:     ?   General: Normal range of motion.  ?   Cervical back: Normal range of motion and neck supple.  ?Skin: ?   General: Skin is warm and dry.  ?Neurological:  ?   General: No focal deficit present.  ?   Mental Status: She is alert and oriented to person, place, and time.  ?Psychiatric:     ?   Mood and Affect: Mood normal.     ?  Behavior: Behavior normal.  ? ? ? ?Lab Results: ?Lab Results  ?Component Value Date  ? WBC 7.6 11/21/2021  ? HGB 8.2 (L) 11/21/2021  ? HCT 24.3 (L) 11/21/2021  ? MCV 88.0 11/21/2021  ? PLT 233 11/21/2021  ?  ?Lab Results  ?Component Value Date  ? NA 129 (L) 11/21/2021  ? K 3.8 11/21/2021  ? CO2 23 11/21/2021  ? GLUCOSE 116 (H) 11/21/2021  ? BUN 32 (H) 11/21/2021  ? CREATININE 0.67 11/21/2021  ? CALCIUM 7.6 (L) 11/21/2021  ? GFRNONAA >60 11/21/2021  ? GFRAA >60 12/12/2017  ?  ?Lab Results  ?Component Value Date  ? ALT 21 11/21/2021  ? AST 19 11/21/2021  ? ALKPHOS 97 11/21/2021  ? BILITOT 0.6 11/21/2021  ? ? ?No results found for: CRP ? ?No results found for: ESRSEDRATE ? ?I have reviewed the micro and lab results in Epic. ? ?Imaging: ?No results found.  ? ?Imaging independently reviewed in Epic. ? ?Mignon Pine ?[facility] for Infectious Disease ?[place] ?[phone] pager ?11/21/2021, 11:42 AM ? ? ?

## 2021-11-21 NOTE — Progress Notes (Signed)
Physical Therapy Treatment ?Patient Details ?Name: Jessica [name] ?MRN: [id number] ?DOB: May 04, 1961 ?Today's Date: 11/21/2021 ? ? ?History of Present Illness Jessica [name] is an 61 y.o. female past medical history significant for alcohol abuse, with admissions for severe alcohol withdrawals requiring ICU admissions tobacco use presented to the ED on 10/22/2021 diffuse abdominal pain that started the day prior to admission.  CT of the abdomen pelvis showed pneumoperitoneum and free fluid, severe sepsis with started on broad-spectrum antibiotics fluid resuscitated surgery was consulted was taken emergently to the OR underwent exploratory laparotomy and was found to have a perforated sigmoid colon, status post Hartmann procedure and insertion of the G-tube which was left to drain to gravity, JP in pelvis was noted to have feculent peritonitis in the pelvis.  ICU course was complicated by Klebsiella bacteremia profound caloric protein malnutrition requiring TPN, acute respiratory failure with hypoxia and tachycardia with failure to progress ? ?  ?PT Comments  ? ? General Comments: AxO x 3 "not looking forward to this" (Physical Therapy) but agreed to get OOB and amb with EVA walker.  "I like this walker", stated pt.  Significant other present ans assisted by following with recliner as pt amb in hallway.  Overalll feels "drained". General bed mobility comments: HOB elevated and B knees slightly bent, assisted with sitting EOB.  "Give me a minute" with initial c/o dizziness that decreased with time.  Minimal functional use and caution to R UE s/p shoulder fx 09/20/21 per chart review images.  General transfer comment: pt was able to rise from elevated bed with + 2 side by side assist.  General Gait Details: using B Platform EVA walker and + 2 assist with Sig Other following with recliner, pt was able to amb 42 feet but with much effort and MAX c/o weakness as well as B LE tremors from muscle atrophy.  Max c/o fatigue after.   Assisted back to bed and positioned to comfort. ?Pt will need ST Rehab at SNF ?  ?Recommendations for follow up therapy are one component of a multi-disciplinary discharge planning process, led by the attending physician.  Recommendations may be updated based on patient status, additional functional criteria and insurance authorization. ? ?Follow Up Recommendations ? Skilled nursing-short term rehab (<3 hours/day) ?  ?  ?Assistance Recommended at Discharge Frequent or constant Supervision/Assistance  ?Patient can return home with the following Two people to help with walking and/or transfers;A lot of help with bathing/dressing/bathroom;Assistance with cooking/housework;Assist for transportation;Help with stairs or ramp for entrance ?  ?Equipment Recommendations ? None recommended by PT  ?  ?Recommendations for Other Services   ? ? ?  ?Precautions / Restrictions Precautions ?Precautions: Fall ?Precaution Comments: multiple lines--> L JP drain, gastrostomy tube, triple lumen central line, colostomy ?Restrictions ?Weight Bearing Restrictions: No ?Other Position/Activity Restrictions: recent R UE/shoulder fx - assume NWB, though per chart review RUE WB status expired on 10/27/21. awaiting ortho consult  ?  ? ?Mobility ? Bed Mobility ?Overal bed mobility: Needs Assistance ?Bed Mobility: Supine to Sit ?  ?  ?Supine to sit: Mod assist, Max assist ?  ?  ?General bed mobility comments: HOB elevated and B knees slightly bent, assisted with sitting EOB.  "Give me a minute" with initial c/o dizziness that decreased with time.  Minimal functional use and caution to R UE s/p shoulder fx 09/20/21 per chart review images. ?  ? ?Transfers ?Overall transfer level: Needs assistance ?Equipment used: Bilateral platform walker (EVA walker) ?Transfers: Sit to/from  Stand ?Sit to Stand: Min assist, Mod assist ?  ?  ?  ?  ?  ?General transfer comment: pt was able to rise from elevated bed with + 2 side by side assist. ?   ? ?Ambulation/Gait ?Ambulation/Gait assistance: Mod assist, +2 physical assistance, +2 safety/equipment ?Gait Distance (Feet): 42 Feet ?Assistive device: Bilateral platform walker (EVA walker) ?Gait Pattern/deviations: Step-to pattern, Step-through pattern, Decreased step length - right, Decreased step length - left, Decreased stance time - right, Trunk flexed ?Gait velocity: decreased ?  ?  ?General Gait Details: using B Platform EVA walker and + 2 assist with Sig Other following with recliner, pt was able to amb 42 feet but with much effort and MAX c/o weakness as well as B LE tremors from muscle atrophy.  Max c/o fatigue after.  Assisted back to bed. ? ? ?Stairs ?  ?  ?  ?  ?  ? ? ?Wheelchair Mobility ?  ? ?Modified Rankin (Stroke Patients Only) ?  ? ? ?  ?Balance   ?  ?  ?  ?  ?  ?  ?  ?  ?  ?  ?  ?  ?  ?  ?  ?  ?  ?  ?  ? ?  ?Cognition Arousal/Alertness: Awake/alert ?Behavior During Therapy: [facility] for tasks assessed/performed ?Overall Cognitive Status: Within Functional Limits for tasks assessed ?  ?  ?  ?  ?  ?  ?  ?  ?  ?  ?  ?  ?  ?  ?  ?  ?General Comments: AxO x 3 "not looking forward to this" (Physical Therapy) but agreed to get OOB and amb with EVA walker.  "I like this walker", stated pt.  Significant other present ans assisted by following with recliner as pt amb in hallway.  Overalll feels "drained". ?  ?  ? ?  ?Exercises   ? ?  ?General Comments   ?  ?  ? ?Pertinent Vitals/Pain Pain Assessment ?Pain Assessment: Faces ?Faces Pain Scale: Hurts even more ?Pain Location: ABD with "getting up and down" from bed. ?Pain Descriptors / Indicators: Discomfort, Grimacing ?Pain Intervention(s): Monitored during session, Repositioned  ? ? ?Home Living   ?  ?  ?  ?  ?  ?  ?  ?  ?  ?   ?  ?Prior Function    ?  ?  ?   ? ?PT Goals (current goals can now be found in the care plan section) Progress towards PT goals: Progressing toward goals ? ?  ?Frequency ? ? ? Min 2X/week ? ? ? ?  ?PT Plan Current plan remains  appropriate  ? ? ?Co-evaluation   ?  ?  ?  ?  ? ?  ?AM-PAC PT "6 Clicks" Mobility   ?Outcome Measure ? Help needed turning from your back to your side while in a flat bed without using bedrails?: A Lot ?Help needed moving from lying on your back to sitting on the side of a flat bed without using bedrails?: A Lot ?Help needed moving to and from a bed to a chair (including a wheelchair)?: A Lot ?Help needed standing up from a chair using your arms (e.g., wheelchair or bedside chair)?: A Lot ?Help needed to walk in hospital room?: A Lot ?Help needed climbing 3-5 steps with a railing? : Total ?6 Click Score: 11 ? ?  ?End of Session Equipment Utilized During Treatment: Gait belt ?Activity Tolerance: Patient limited by  fatigue ?Patient left: in bed;with call bell/phone within reach;with family/visitor present ?Nurse Communication: Mobility status ?PT Visit Diagnosis: Muscle weakness (generalized) (M62.81);History of falling (Z91.81);Difficulty in walking, not elsewhere classified (R26.2);Pain ?Pain - part of body: Shoulder (ABD) ?  ? ? ?Time: 4715-8063 ?PT Time Calculation (min) (ACUTE ONLY): 30 min ? ?Charges:  $Gait Training: 8-22 mins ?$Therapeutic Activity: 8-22 mins          ?          ? ?Rica Koyanagi  PTA ?Acute  Rehabilitation Services ?Pager      [phone] ?Office      [phone] ? ? ? ?

## 2021-11-21 NOTE — Progress Notes (Signed)
PHARMACY - TOTAL PARENTERAL NUTRITION CONSULT NOTE  ? ?Indication: Prolonged ileus ? ?Patient Measurements: ?Height: '5\' 10"'  (177.8 cm) ?Weight: 60.8 kg (134 lb 0.6 oz) ?IBW/kg (Calculated) : 68.5 ?TPN AdjBW (KG): 53.8 ?Body mass index is 19.23 kg/m?. ?Usual Weight: 53.8 kg ? ?Assessment: 61 yo F w/ PMH etoh use disorder, smoker, HTN, GERD. S/p Exp Lap Hartmans 10/22/2021 for perforated sigmoid colon, Dr. Marcello Moores.   ? ?Glucose / Insulin: no hx DM.  CBGs continue to be <150.  (insulin d/c 5/1) ?Electrolytes: Na low 129, Mag 1.6; K 3.8; CorrCa 9.8, phos wnl  ?- goal for ileus: Mag > 2, K > 4, phos ~3 ?- CL and CO2 wnl ?Vitamins:   ?- Vitamin C <0.1 (4/22); receiving 200 mg ascorbic acid daily in TPN from MVI ?- Vitamin A 14.2 (4/22); receiving 1 mg of vitamin A daily in TPN from [facility] ?- Unable to add additional ascorbic acid and vitamin A to TPN per TPN policy ?- 5/5 - initiated ascorbic acid 500 mg per tube BID and vitamin A 10,000 units per tube daily. Tube to be clamped x1 hour after administration then returned to suction.  ?Renal: SCr WNL; BUN still elevated but trending down; UOP good ?Hepatic: LFTs, Tbili, Alk Phos WNL; TG stable WNL. - Albumin remains low ?Intake / Output:  ?- I/O: +1205 mL ?-UOP: 1.4 ml/kg/hr.  ?- Drain output: 175 mL.  ?- Colostomy: 55 mL  ?-No mIVF ?GI Imaging:  ?- 4/15 CTA: bowel perforation ?- 4/17 KUB ileus or pSBO ?- 4/20 CTA: severe ileus or pSBO ?- 5/04 CT: new pelvic collection not amenable to drainage, likely an abscess ?- 5/11 CTa/p: fluid collections unchanged from 5/4 ?GI Surgeries / Procedures:  ?- 10/22/21 s/p EXPLORATORY LAPAROTOMY hartmans for perforated sigmoid colon ?- 4/23 IR placed drain LLQ ?- 5/10 Drainage cath exchanged ? ?Central access: CVC TL placed 10/23/21 ?TPN start date: 10/26/21 ? ?Nutritional Goals: ?Goal TPN rate is 95 mL/hr (provides 109 g of protein and 2170 kcals per day)  ? ?RD Assessment: (5/10) ?Estimated Needs ?Total Energy Estimated Needs: 2000-2200  kcal ?Total Protein Estimated Needs: 105-125 grams ?Total Fluid Estimated Needs: >/= 2.2 L/day ? ?Current Nutrition:  ?- TPN ?- Boost 1 container bid (pt refusing) ?- Diet advanced from NPO to CLD on 5/12 ?G-tube clamped 5/7. Tolerating ice chips/sips. SLP following ? ?Plan:  ? ?Now:  ?- magnesium sulfate 1gm IV x1 ? ?At 18:00:   ?Continue TPN @  95 mL/hr to provide 100% of goals ?Electrolytes in TPN:  ?Increase Na to  150 mEq/L ?K  50 mEq/L ?Ca 3 mEq/L ?Mg 8 mEq/L ?Phos 5 mmol/L ?Cl:Ac 1:2  ?Continue standard MVI and trace elements to TPN ?Continue folic acid & thiamine in TPN ?Monitor TPN labs on Mon/Thurs ?CMET, phos, mag ordered as daily by MD ?Follow up enteral intake for appropriate wean off TPN   ? ?Lynelle Doctor, PharmD, BCPS ?Clinical Pharmacist ?11/21/2021 6:59 AM ?

## 2021-11-21 NOTE — Progress Notes (Signed)
Speech Language Pathology Treatment: Dysphagia  ?Patient Details ?Name: Jessica [name] ?MRN: [id number] ?DOB: [birth year] ?Today's Date: 11/21/2021 ?Time: 9381-0175 ?SLP Time Calculation (min) (ACUTE ONLY): 20 min ? ?Assessment / Plan / Recommendation ?Clinical Impression ? Patient seen by SLP for skilled treatment session focused on dyshpagia goals. Significant other present in room as well and he as well as patient educated regarding swallow function. Patient was sleeping in bed when SLP arrived and saying she "was having the best sleep since she got here" but was willing to sit up and try some advanced solids. After getting patient repositioned in bed, SLP observed her with PO intake of: gelatin, regular solids (crackers) and thin liquids (water). She did not exhibit any overt s/s of aspiration or penetration, no c/o globus sensation as she had during initial swallow evaluation and mastication and oral phase of swallow appeared [facility]. Surgical PA entered room during this session and SLP confirmed with her regarding upgrading to solids and order written for Diet: soft (for digestion). Patient encouraged to try some advanced solids and she reported she wanted some mashed potatoes. SLP will follow briefly for toleration.  ?  ?HPI HPI: 61 year old lady who lives at home with her boyfriend, admitted with abdominal pain.  Has a history of alcohol use and was found to have perforated sigmoid colon.  Underwent ex lap Hartman's procedure, gastrostomy tube placement on 10-22-2021. Was put on TPN for severe protein calorie malnutrition.  History of chronic right proximal humerus fracture as well. Patient is awake alert resting in bed.  She has some degree of dysarthria per notes.  Pt hospital coarse also complicated by AMS, infection, aspiration, etc.  Pt underwent surgery on 4/15 - extubated in OR, reintubated 4/17, extubated 4/20, reintubated 5/1 and extubated 5/3. Swallow eval ordered.  Last chest imaging showed Small left  pleural effusion with lower lobe atelectasis. Left  lower lobe aeration is improved from prior CT. ?  ?   ?SLP Plan ? Continue with current plan of care ? ?  ?  ?Recommendations for follow up therapy are one component of a multi-disciplinary discharge planning process, led by the attending physician.  Recommendations may be updated based on patient status, additional functional criteria and insurance authorization. ?  ? ?Recommendations  ?Diet recommendations: Regular;Thin liquid ?Liquids provided via: Cup;Straw ?Medication Administration: Whole meds with liquid ?Supervision: Patient able to self feed;Intermittent supervision to cue for compensatory strategies ?Compensations: Slow rate;Small sips/bites ?Postural Changes and/or Swallow Maneuvers: Seated upright 90 degrees  ?   ?    ?   ? ? ? ? Oral Care Recommendations: Oral care BID;Staff/trained caregiver to provide oral care ?Follow Up Recommendations: Skilled nursing-short term rehab (<3 hours/day) ?Assistance recommended at discharge: Frequent or constant Supervision/Assistance ?SLP Visit Diagnosis: Dysphagia, oropharyngeal phase (R13.12) ?Plan: Continue with current plan of care ? ? ? ? ?  ?  ? ? ?Sonia Baller, MA, CCC-SLP ?Speech Therapy ? ?

## 2021-11-21 NOTE — Progress Notes (Signed)
30 Days Post-Op  ? ?Subjective/Chief Complaint: ?No complaints. States her bag "blew off" twice yesterday - unable to tell me if this was the ostomy or eakin pouch. SLP at bedside and has cleared her  for a diet. Reports she is not very hungry ? ? ?Objective: ?Vital signs in last 24 hours: ?Temp:  [98.3 ?F (36.8 ?C)-99.6 ?F (37.6 ?C)] (P) 99.6 ?F (37.6 ?C) (05/15 1003) ?Pulse Rate:  [105-117] (P) 108 (05/15 1003) ?Resp:  [20-22] 20 (05/15 0554) ?BP: (150-161)/(80-88) (P) 146/76 (05/15 1003) ?SpO2:  [96 %-98 %] (P) 97 % (05/15 1003) ?Weight:  [60.8 kg] 60.8 kg (05/15 0500) ?Last BM Date :  (ostomy) ? ?Intake/Output from previous day: ?05/14 0701 - 05/15 0700 ?In: 3475.6 [P.O.:900; I.V.:2416.9; IV Piggyback:158.7] ?Out: 2270 [Urine:2040; Drains:175; Stool:55] ?Intake/Output this shift: ?No intake/output data recorded. ? ?General appearance: alert and cooperative ?Resp: clear to auscultation bilaterally ?Cardio: regular rate and rhythm ?GI: soft, appropriately tender. Ostomy and eakins pouch intact ? ?Lab Results:  ?Recent Labs  ?  11/20/21 ?0426 11/21/21 ?0321  ?WBC 7.2 7.6  ?HGB 8.1* 8.2*  ?HCT 24.4* 24.3*  ?PLT 229 233  ? ?BMET ?Recent Labs  ?  11/20/21 ?0426 11/21/21 ?0321  ?NA 132* 129*  ?K 4.2 3.8  ?CL 103 99  ?CO2 21* 23  ?GLUCOSE 114* 116*  ?BUN 35* 32*  ?CREATININE 0.84 0.67  ?CALCIUM 8.1* 7.6*  ? ?PT/INR ?No results for input(s): LABPROT, INR in the last 72 hours. ?ABG ?No results for input(s): PHART, HCO3 in the last 72 hours. ? ?Invalid input(s): PCO2, PO2 ? ?Studies/Results: ?No results found. ? ?Anti-infectives: ?Anti-infectives (From admission, onward)  ? ? Start     Dose/Rate Route Frequency Ordered Stop  ? 11/14/21 0800  piperacillin-tazobactam (ZOSYN) IVPB 3.375 g       ? 3.375 g ?12.5 mL/hr over 240 Minutes Intravenous Every 8 hours 11/14/21 0739    ? 11/04/21 1000  piperacillin-tazobactam (ZOSYN) IVPB 3.375 g       ? 3.375 g ?12.5 mL/hr over 240 Minutes Intravenous Every 8 hours 11/04/21 0935  11/11/21 1312  ? 10/27/21 1600  cefTRIAXone (ROCEPHIN) 2 g in sodium chloride 0.9 % 100 mL IVPB  Status:  Discontinued       ? 2 g ?200 mL/hr over 30 Minutes Intravenous Every 24 hours 10/27/21 0843 10/27/21 1106  ? 10/27/21 1600  piperacillin-tazobactam (ZOSYN) IVPB 3.375 g       ? 3.375 g ?12.5 mL/hr over 240 Minutes Intravenous Every 8 hours 10/27/21 1106 11/03/21 2000  ? 10/27/21 1400  metroNIDAZOLE (FLAGYL) IVPB 500 mg  Status:  Discontinued       ? 500 mg ?100 mL/hr over 60 Minutes Intravenous Every 12 hours 10/27/21 0843 10/27/21 1106  ? 10/24/21 1600  piperacillin-tazobactam (ZOSYN) IVPB 3.375 g  Status:  Discontinued       ? 3.375 g ?12.5 mL/hr over 240 Minutes Intravenous Every 8 hours 10/24/21 1422 10/27/21 0843  ? 10/24/21 1000  cefTRIAXone (ROCEPHIN) 2 g in sodium chloride 0.9 % 100 mL IVPB  Status:  Discontinued       ? 2 g ?200 mL/hr over 30 Minutes Intravenous Every 24 hours 10/24/21 0747 10/24/21 1357  ? 10/23/21 1530  vancomycin (VANCOCIN) IVPB 1000 mg/200 mL premix       ? 1,000 mg ?200 mL/hr over 60 Minutes Intravenous  Once 10/23/21 1439 10/23/21 1547  ? 10/23/21 0200  ceFEPIme (MAXIPIME) 2 g in sodium chloride 0.9 % 100 mL  IVPB  Status:  Discontinued       ? 2 g ?200 mL/hr over 30 Minutes Intravenous Every 12 hours 10/22/21 2032 10/24/21 0747  ? 10/22/21 2200  cefOXitin (MEFOXIN) 2 g in sodium chloride 0.9 % 100 mL IVPB  Status:  Discontinued       ? 2 g ?200 mL/hr over 30 Minutes Intravenous Every 8 hours 10/22/21 1706 10/22/21 2032  ? 10/22/21 1956  vancomycin variable dose per unstable renal function (pharmacist dosing)  Status:  Discontinued       ?  Does not apply See admin instructions 10/22/21 1957 10/23/21 1917  ? 10/22/21 1245  vancomycin (VANCOCIN) IVPB 1000 mg/200 mL premix       ? 1,000 mg ?200 mL/hr over 60 Minutes Intravenous  Once 10/22/21 1241 10/22/21 1402  ? 10/22/21 1245  ceFEPIme (MAXIPIME) 2 g in sodium chloride 0.9 % 100 mL IVPB       ? 2 g ?200 mL/hr over 30 Minutes  Intravenous  Once 10/22/21 1241 10/22/21 1321  ? 10/22/21 1230  metroNIDAZOLE (FLAGYL) IVPB 500 mg  Status:  Discontinued       ? 500 mg ?100 mL/hr over 60 Minutes Intravenous Every 12 hours 10/22/21 1222 10/24/21 1357  ? ?  ? ? ?Assessment/Plan: ?s/p Procedure(s): ?EXPLORATORY LAPAROTOMY hartmans (N/A) ?Continue clears ?POD#30 - PERFORATED SIGMOID COLON - status post EX LAP, HARTMAN'S PROCEDURE, Gastrostomy tube placement - 1/54/0086 Dr. 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 WOCN following for colostomy and pouching of fistula ?- CT 5/4 also with pelvic fluid collection measuring 6 x 3.2 cm. Discussed with IR and not felt amenable/safe for drainage. IV abx. Repeat CT 5/11 w/ stable pelvic abscess that remains unable to be safely accessed per IR (Dr. Kathlene Cote) ?-  G-tube clamped 5/7. Patient tolerating. Tolerating ice chips/sips. SLP following  ?FEN - G-tube capped, TPN, diet SOFT starting today; prealbumin 18.2 on 5/13 ?VTE - SCDs, SQH ?ID - zosyn 4/17 - 4/27; 4/28-5/6, 5/8 >> ?  ?Continue IV abx - do not have source control at this time, pelvic abscess not amenable to drainage. Advance diet to SOFT and monitor drain/fistula output. No emergent surgical needs. Continue to try to avoid surgery if possible as she is only 4 weeks out from her last operation - her abdomen would be scarred in and high risk for complications, not to mention she has not had any enteral nutrition since surgery.  We will continue to follow closely. Check prealbumin in AM. ? ? LOS: 30 days  ? ? ?[place] ?11/21/2021 ? ?

## 2021-11-21 NOTE — Progress Notes (Signed)
?PROGRESS NOTE ? ? ? ?Jessica [name]  UDJ:[id number] DOB: Apr 07, 1961 DOA: 10/22/2021 ?PCP: Deland Pretty, MD  ? ? ? ?Brief Narrative:  ?Jessica [name] is an 61 y.o. female past medical history significant for alcohol abuse, with admissions for severe alcohol withdrawals requiring ICU admissions tobacco use presented to the ED on 10/22/2021 diffuse abdominal pain that started the day prior to admission.  CT of the abdomen pelvis showed pneumoperitoneum and free fluid, severe sepsis with started on broad-spectrum antibiotics fluid resuscitated surgery was consulted was taken emergently to the OR underwent exploratory laparotomy and was found to have a perforated sigmoid colon, status post Hartmann procedure and insertion of the G-tube which was left to drain to gravity, JP in pelvis was noted to have feculent peritonitis in the pelvis.  ICU course was complicated by Klebsiella bacteremia profound caloric protein malnutrition requiring TPN, acute respiratory failure with hypoxia and tachycardia with failure to progress ? ? ?Subjective: ?5/15 afebrile overnight, A/O x4 ? ? ?Assessment & Plan: ?Covid vaccination; ?  ?Principal Problem: ?  Severe sepsis due to perforated sigmoid colon s/p Hartmann/colostomy 10/22/2021 ?Active Problems: ?  Acute respiratory failure with hypoxia ([place]) ?  Acute metabolic encephalopathy ?  Oropharyngeal dysphagia ?  Chronic pain ?  Tobacco use disorder ?  Protein-calorie malnutrition, severe ([place]) ?  AKI (acute kidney injury) ([place]) ?  Hypernatremia, hyponatremia, hypokalemia, hyperphosphatemia ?  Elevated brain natriuretic peptide (BNP) level ?  Right proximal humeral fracture ?  Normocytic anemia ?  Substance induced mood disorder ([place]) ?  Anxiety ?  Primary insomnia ?  Underweight ?  Colostomy in place [facility]) ?  Stricture and stenosis of esophagus ?  Gastrostomy tube in place [facility]) ?  Hyponatremia ?  Palliative care by specialist ?  Goals of care, counseling/discussion ?  General weakness ?   Septic shock ([place]) ?  Postoperative intra-abdominal abscess ?  Colocutaneous fistula ?  Generalized abdominal pain ?  Aspiration pneumonia ([place]) ?  Bacteremia due to Klebsiella pneumoniae ?  Acute encephalopathy ?  Humerus fracture ? ?Severe septic shock due to perforated sigmoid colon  ?-4/15/ 2023 s/p Hartmann/colostomy  ?-10/21/2021 s/p G-tube placement.  Complicated by colocutaneous fistula with Pseudomonas intra-abdominal leak requiring percutaneous drainage: ?-10/29/2021 lower quadrant drain placed: Managed per IR ?Repeated CT scan on 11/10/2031 showed new pelvic collection not amenable to drainage likely an abscess. ?Patient is febrile, with a Tmax of 100.9. ?-CT on 10/10/2021 showed pelvic fluid collection not amenable to drainage.  Lesion abscess. ?Keep the patient n.p.o. continue NG tube and TNA and antibiotics per surgery. ?-Antibiotics were resumed on 11/14/2021. ?-5/10 s/p fluoro guided exchange up-sizing and repositioning of now 14 Fr drainage catheter into peri-stomal collection yielding feculent debris ?-5/11 CT abdomen and pelvis showing continued abscesses/fluid collection.  Will discuss in a.m. with CCS if now amenable to drainage ?-5/12 discussed case with PA Obie Dredge CCS and concurs that surgery at this time would be too high risk.  We will continue with antibiotics. ?- 5/13 discussed case with Dr. Jule Ser ID.  Secondary to complexity of case will evaluate patient today or tomorrow, will await recommendations.  ? ?Septic shock/Aspiration pneumonia/Abdominal infection: ?-Has been weaned off pressors  ?-Strict in and out +12.5 L ?- Daily weight ?Filed Weights  ? 11/18/21 0500 11/19/21 0330 11/21/21 0500  ?Weight: 55.3 kg 59.9 kg 60.8 kg  ?   ? ?Acute respiratory failure with hypoxia/Aspiration pneumonia: ?-Extubated on 10/27/2021 due to altered mental status had to be  reintubated on 11/07/2021. ?-Extubated on 11/08/2021. ?-Currently n.p.o. ,now on room air. ?-Physical therapy is on board. Will  need SNF vs LTAC. ?   ?Klebsiella bacteremia ?-Blood cultures on 10/23/2018 ?-Initially placed on vancomycin Flagyl and cefepime on 10/22/2021 ?-De-escalated to IV Zosyn 10/24/2021, she has now completed 2-week course of IV antibiotics for bacteremia. ?-Continue IV Zosyn per surgery for intra-abdominal infection ? ?Acute encephalopathy  ?-Multifactorial  ICU delirium, infectious etiology, EtOH withdrawal: ?-5/10 resolved . ?-Continue low-dose fentanyl for pain. ?-Limit sedating medication. ?-5/11 resolved ? ?AKI ?-Secondary to severe septic shock: ?-Was on IV diuresis, diuretics were held she is positive about 9 L. ? ?Lab Results  ?Component Value Date  ? CREATININE 0.67 11/21/2021  ? CREATININE 0.84 11/20/2021  ? CREATININE 0.78 11/19/2021  ? CREATININE 0.90 11/18/2021  ? CREATININE 0.80 11/17/2021  ?-5/11 resolved ? ?Essential HTN ?- 5/11 Metoprolol IV 5 mg TID ?-5/13 increase Metoprolol IV 5 mg QID ?-5/14 Hydralazine IV 5 mg QID ?   ?Normocytic anemia: ?-4/22 transfuse 1 unit PRBC ?-5/3 transfuse 1 unit PRBC ?-5/10 anemia panel pending ?Lab Results  ?Component Value Date  ? HGB 8.2 (L) 11/21/2021  ? HGB 8.1 (L) 11/20/2021  ? HGB 8.4 (L) 11/19/2021  ? HGB 6.9 (LL) 11/19/2021  ? HGB 7.8 (L) 11/18/2021  ?-5/12 transfuse for hemoglobin<7 ?-5/13 transfuse 1 unit PRBC ? ?Severe protein caloric malnutrition/cachexia: ?-Currently on TPN. ?-G-tube placed on 10/22/2021, ? ?Oropharyngeal dysphagia: ?-5/11 Speech concurs with continued n.p.o., TPN and antibiotics per surgery.  ?-5/12 patient much more alert and interactive reconsult speech for swallow evaluation. ?-5/15 patient passed swallow evaluation: Recommend regular diet with thin liquid ? ?Anorexia ?- Megace 400 mg BID ? ?RIGHT proximal humerus fracture: ?-Status post fall in March 2023 seen by Ortho during that month with plan for nonweightbearing for 1 month then follow-up with them as an outpatient. ? ?Hypernatremia//hyponatremia: ?Lab Results  ?Component Value Date  ?  NA 129 (L) 11/21/2021  ? NA 132 (L) 11/20/2021  ? NA 127 (L) 11/19/2021  ? NA 129 (L) 11/18/2021  ? NA 130 (L) 11/17/2021  ?-Slightly low, asymptomatic. ? ?  ?Hypokalemia ?-Potassium goal> 4 ?-5/10 potassium IV 50 mEq ? ?Hypomagnesmia ?- 5/10 Magnesium goal> 2 ?-5/15 Magnesium IV 3 g ?  ?Substance induced mood disorder (HCC)/ Anxiety ? ? ?Pressure Injury 10/27/21 Vertebral column Mid;Medial Deep Tissue Pressure Injury - Purple or maroon localized area of discolored intact skin or blood-filled blister due to damage of underlying soft tissue from pressure and/or shear. (Active)  ?10/27/21 0428  ?Location: Vertebral column  ?Location Orientation: Mid;Medial  ?Staging: Deep Tissue Pressure Injury - Purple or maroon localized area of discolored intact skin or blood-filled blister due to damage of underlying soft tissue from pressure and/or shear.  ?Wound Description (Comments):   ?Present on Admission: No  ?Dressing Type Foam - Lift dressing to assess site every shift 11/20/21 2031  ?-Continue treatment per wound care ? ?  ? ? ?Mobility Assessment (last 72 hours)   ? ? Mobility Assessment   ? ? [place] Name 11/20/21 2031 11/20/21 0830 11/19/21 2041 11/19/21 1433 11/19/21 0800  ? Does patient have an order for bedrest or is patient medically unstable No - Continue assessment No - Continue assessment No - Continue assessment -- No - Continue assessment  ? What is the highest level of mobility based on the progressive mobility assessment? -- Level 3 (Stands with assist) - Balance while standing  and cannot march in place Level 4 (  Walks with assist in room) - Balance while marching in place and cannot step forward and back - Complete Level 3 (Stands with assist) - Balance while standing  and cannot march in place Level 3 (Stands with assist) - Balance while standing  and cannot march in place  ? Is the above level different from baseline mobility prior to current illness? -- Yes - Recommend PT order Yes - Recommend PT order --  Yes - Recommend PT order  ? ? [place] Name 11/18/21 2000 11/18/21 1500  ?  ?  ?  ? Does patient have an order for bedrest or is patient medically unstable No - Continue assessment --     ? What is the highes

## 2021-11-22 LAB — CBC WITH DIFFERENTIAL/PLATELET
Abs Immature Granulocytes: 0.02 10*3/uL (ref 0.00–0.07)
Basophils Absolute: 0 10*3/uL (ref 0.0–0.1)
Basophils Relative: 0 %
Eosinophils Absolute: 0.4 10*3/uL (ref 0.0–0.5)
Eosinophils Relative: 6 %
HCT: 23.2 % — ABNORMAL LOW (ref 36.0–46.0)
Hemoglobin: 7.6 g/dL — ABNORMAL LOW (ref 12.0–15.0)
Immature Granulocytes: 0 %
Lymphocytes Relative: 27 %
Lymphs Abs: 1.9 10*3/uL (ref 0.7–4.0)
MCH: 29.2 pg (ref 26.0–34.0)
MCHC: 32.8 g/dL (ref 30.0–36.0)
MCV: 89.2 fL (ref 80.0–100.0)
Monocytes Absolute: 0.6 10*3/uL (ref 0.1–1.0)
Monocytes Relative: 9 %
Neutro Abs: 4.2 10*3/uL (ref 1.7–7.7)
Neutrophils Relative %: 58 %
Platelets: 196 10*3/uL (ref 150–400)
RBC: 2.6 MIL/uL — ABNORMAL LOW (ref 3.87–5.11)
RDW: 16.4 % — ABNORMAL HIGH (ref 11.5–15.5)
WBC: 7.1 10*3/uL (ref 4.0–10.5)
nRBC: 0 % (ref 0.0–0.2)

## 2021-11-22 LAB — COMPREHENSIVE METABOLIC PANEL
ALT: 21 U/L (ref 0–44)
AST: 18 U/L (ref 15–41)
Albumin: 1.8 g/dL — ABNORMAL LOW (ref 3.5–5.0)
Alkaline Phosphatase: 90 U/L (ref 38–126)
Anion gap: 6 (ref 5–15)
BUN: 33 mg/dL — ABNORMAL HIGH (ref 6–20)
CO2: 26 mmol/L (ref 22–32)
Calcium: 7.6 mg/dL — ABNORMAL LOW (ref 8.9–10.3)
Chloride: 99 mmol/L (ref 98–111)
Creatinine, Ser: 0.75 mg/dL (ref 0.44–1.00)
GFR, Estimated: >60 mL/min (ref >60)
Glucose, Bld: 128 mg/dL — ABNORMAL HIGH (ref 70–99)
Potassium: 3.5 mmol/L (ref 3.5–5.1)
Sodium: 131 mmol/L — ABNORMAL LOW (ref 135–145)
Total Bilirubin: 0.6 mg/dL (ref 0.3–1.2)
Total Protein: 5.6 g/dL — ABNORMAL LOW (ref 6.5–8.1)

## 2021-11-22 LAB — GLUCOSE, CAPILLARY
Glucose-Capillary: 123 mg/dL — ABNORMAL HIGH (ref 70–99)
Glucose-Capillary: 132 mg/dL — ABNORMAL HIGH (ref 70–99)
Glucose-Capillary: 142 mg/dL — ABNORMAL HIGH (ref 70–99)

## 2021-11-22 LAB — PHOSPHORUS: Phosphorus: 3.4 mg/dL (ref 2.5–4.6)

## 2021-11-22 LAB — MAGNESIUM: Magnesium: 2.1 mg/dL (ref 1.7–2.4)

## 2021-11-22 MED ORDER — ACETAMINOPHEN 325 MG PO TABS
650.0000 mg | ORAL_TABLET | Freq: Once | ORAL | Status: AC
  Administered 2021-11-22: 650 mg via ORAL
  Filled 2021-11-22: qty 2

## 2021-11-22 MED ORDER — POTASSIUM CHLORIDE 20 MEQ PO PACK
20.0000 meq | PACK | Freq: Once | ORAL | Status: AC
  Administered 2021-11-22: 20 meq via ORAL
  Filled 2021-11-22: qty 1

## 2021-11-22 MED ORDER — TRAVASOL 10 % IV SOLN
INTRAVENOUS | Status: AC
  Filled 2021-11-22: qty 1094.4

## 2021-11-22 MED ORDER — METOPROLOL TARTRATE 25 MG PO TABS
12.5000 mg | ORAL_TABLET | Freq: Two times a day (BID) | ORAL | Status: DC
Start: 2021-11-22 — End: 2021-11-23
  Administered 2021-11-22: 12.5 mg via ORAL
  Filled 2021-11-22 (×2): qty 1

## 2021-11-22 NOTE — Progress Notes (Signed)
2 wound certified RNs Brien Mates RN and Archer Asa O'Berry RN changed Jessica [name] pouch due to leakage. Colostomy bag and Eakins pouch unavoidably overlap due to close locations of stoma and wound, and also her PEG tube and L side drain. Therefore the colostomy bag also had to be changed. Eakins pouch was too small for wound. Attempted to split the adhesive on the bottom of the pouch and expand by adding back adhesive that we had cut out from pouch in the inferior aspect of wound to cover the whole area, but it was very difficult and pouch is still leaking.  We did try to contact two of the [place] RNs for assistance.  Unsure if we would be able to utilize a larger size Eakins pouch again due to the geography of drains/tubes/pouches on her abdomen and lack of space between the 4. Maintaining clean, dry skin as often as possible and providing barrier cream to prevent breakdown from drainage.  ?

## 2021-11-22 NOTE — Progress Notes (Signed)
Physical Therapy Treatment ?Patient Details ?Name: Jessica [name] ?MRN: [id number] ?DOB: [birth year] ?Today's Date: 11/22/2021 ? ? ?History of Present Illness Kemberly A Meroney is an 61 y.o. female past medical history significant for alcohol abuse, with admissions for severe alcohol withdrawals requiring ICU admissions tobacco use presented to the ED on 10/22/2021 diffuse abdominal pain that started the day prior to admission.  CT of the abdomen pelvis showed pneumoperitoneum and free fluid, severe sepsis with started on broad-spectrum antibiotics fluid resuscitated surgery was consulted was taken emergently to the OR underwent exploratory laparotomy and was found to have a perforated sigmoid colon, status post Hartmann procedure and insertion of the G-tube which was left to drain to gravity, JP in pelvis was noted to have feculent peritonitis in the pelvis.  ICU course was complicated by Klebsiella bacteremia profound caloric protein malnutrition requiring TPN, acute respiratory failure with hypoxia and tachycardia with failure to progress ? ?  ?PT Comments  ? ? General Comments: AxO x 3 increased motivation.  Pt wants to go home and NOT a "Nursing Home".  Stated her Boy Friend "will be there to help me".  General bed mobility comments: 25% VC's to "log Roll" roll onto side and use rails to push upper body up.  Initail c/o dizziness.  Decreased with time. General transfer comment: pt was able to rise from elevated bed with + 2 side by side assist. General Gait Details: using B Platform EVA walker and + 2 assist with Sig Other following with recliner, pt was able to amb 75 feet but with much effort and MAX c/o weakness as well as B LE tremors from muscle atrophy.  Tolerated well.  Assisted to recliner after vs bed.   ?Recommendations for follow up therapy are one component of a multi-disciplinary discharge planning process, led by the attending physician.  Recommendations may be updated based on patient status, additional  functional criteria and insurance authorization. ? ?Follow Up Recommendations ? Skilled nursing-short term rehab (<3 hours/day) (pt expressing a desire to go home) ?  ?  ?Assistance Recommended at Discharge Frequent or constant Supervision/Assistance  ?Patient can return home with the following Two people to help with walking and/or transfers;A lot of help with bathing/dressing/bathroom;Assistance with cooking/housework;Assist for transportation;Help with stairs or ramp for entrance ?  ?Equipment Recommendations ? None recommended by PT  ?  ?Recommendations for Other Services   ? ? ?  ?Precautions / Restrictions Precautions ?Precautions: Fall ?Precaution Comments: multiple lines--> L JP drain, gastrostomy tube, triple lumen central line, colostomy ?Restrictions ?Weight Bearing Restrictions: No ?Other Position/Activity Restrictions: recent R UE/shoulder fx - assume NWB, though per chart review RUE WB status expired on 10/27/21.  ?  ? ?Mobility ? Bed Mobility ?Overal bed mobility: Needs Assistance ?Bed Mobility: Supine to Sit ?  ?  ?Supine to sit: Mod assist ?  ?  ?General bed mobility comments: 25% VC's to "log Roll" roll onto side and use rails to push upper body up.  Initail c/o dizziness.  Decreased with time. ?  ? ?Transfers ?Overall transfer level: Needs assistance ?Equipment used: Bilateral platform walker ?Transfers: Sit to/from Stand ?Sit to Stand: Min assist, Mod assist ?  ?Step pivot transfers: Min assist ?  ?  ?  ?General transfer comment: pt was able to rise from elevated bed with + 2 side by side assist. ?  ? ?Ambulation/Gait ?Ambulation/Gait assistance: Min assist ?Gait Distance (Feet): 75 Feet ?Assistive device: Bilateral platform walker ?Gait Pattern/deviations: Step-to pattern, Step-through pattern, Decreased step  length - right, Decreased step length - left, Decreased stance time - right, Trunk flexed ?Gait velocity: decreased ?  ?  ?General Gait Details: using B Platform EVA walker and + 2 assist  with Sig Other following with recliner, pt was able to amb 75 feet but with much effort and MAX c/o weakness as well as B LE tremors from muscle atrophy.  Tolerated well.  Assisted to recliner after vs bed. ? ? ?Stairs ?  ?  ?  ?  ?  ? ? ?Wheelchair Mobility ?  ? ?Modified Rankin (Stroke Patients Only) ?  ? ? ?  ?Balance   ?  ?  ?  ?  ?  ?  ?  ?  ?  ?  ?  ?  ?  ?  ?  ?  ?  ?  ?  ? ?  ?Cognition Arousal/Alertness: Awake/alert ?Behavior During Therapy: [facility] for tasks assessed/performed ?Overall Cognitive Status: Within Functional Limits for tasks assessed ?  ?  ?  ?  ?  ?  ?  ?  ?  ?  ?  ?  ?  ?  ?  ?  ?General Comments: AxO x 3 increased motivation.  Pt wants to go home and NOT a "Nursing Home".  Stated her Boy Friend "will be there to help me". ?  ?  ? ?  ?Exercises   ? ?  ?General Comments   ?  ?  ? ?Pertinent Vitals/Pain Pain Assessment ?Pain Assessment: Faces ?Faces Pain Scale: Hurts a little bit ?Pain Location: ABD with "getting up and down" from bed. ?Pain Descriptors / Indicators: Discomfort, Grimacing  ? ? ?Home Living   ?  ?  ?  ?  ?  ?  ?  ?  ?  ?   ?  ?Prior Function    ?  ?  ?   ? ?PT Goals (current goals can now be found in the care plan section) Progress towards PT goals: Progressing toward goals ? ?  ?Frequency ? ? ? Min 2X/week ? ? ? ?  ?PT Plan Current plan remains appropriate  ? ? ?Co-evaluation   ?  ?  ?  ?  ? ?  ?AM-PAC PT "6 Clicks" Mobility   ?Outcome Measure ? Help needed turning from your back to your side while in a flat bed without using bedrails?: A Little ?Help needed moving from lying on your back to sitting on the side of a flat bed without using bedrails?: A Little ?Help needed moving to and from a bed to a chair (including a wheelchair)?: A Little ?Help needed standing up from a chair using your arms (e.g., wheelchair or bedside chair)?: A Little ?Help needed to walk in hospital room?: A Lot ?Help needed climbing 3-5 steps with a railing? : A Lot ?6 Click Score: 16 ? ?  ?End of Session  Equipment Utilized During Treatment: Gait belt ?Activity Tolerance: Patient tolerated treatment well ?Patient left: in chair;with call bell/phone within reach;with chair alarm set ?Nurse Communication: Mobility status ?PT Visit Diagnosis: Muscle weakness (generalized) (M62.81);History of falling (Z91.81);Difficulty in walking, not elsewhere classified (R26.2);Pain ?Pain - Right/Left: Right ?Pain - part of body: Shoulder ?  ? ? ?Time: 0086-7619 ?PT Time Calculation (min) (ACUTE ONLY): 27 min ? ?Charges:  $Gait Training: 8-22 mins ?$Therapeutic Activity: 8-22 mins          ?          ? ?Rica Koyanagi  PTA ?Acute  Rehabilitation Services ?Pager      [phone] ?Office      [phone] ? ?

## 2021-11-22 NOTE — NC FL2 (Signed)
[place] LEVEL OF CARE SCREENING TOOL     IDENTIFICATION  Patient Name: Jessica [name] Birthdate: June 15, 1961 Sex: female Admission Date (Current Location): 10/22/2021  [facility] and [place] Number:  Herbalist and Address:  [facility],  [address], [place]      Provider Number: [id number]  Attending Physician Name and Address:  Allie Bossier, MD  Relative Name and Phone Number:       Current Level of Care: Hospital Recommended Level of Care: [place] Prior Approval Number:    Date Approved/Denied:   PASRR Number: [id number]  Discharge Plan: SNF    Current Diagnoses: Patient Active Problem List   Diagnosis Date Noted   Aspiration pneumonia ([place]) 11/17/2021   Bacteremia due to Klebsiella pneumoniae 11/17/2021   Acute encephalopathy 11/17/2021   Humerus fracture 11/17/2021   Generalized abdominal pain    Septic shock ([place]) 11/09/2021   Postoperative intra-abdominal abscess 11/09/2021   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   Malnutrition of moderate degree 11/07/2021   Palliative care by specialist    Goals of care, counseling/discussion    General weakness    Sinus tachycardia 11/05/2021   Normocytic anemia 11/04/2021   Acute respiratory failure with hypoxia ([place]) 64/68/0321   Acute metabolic encephalopathy 22/48/2500   Hypernatremia, hyponatremia, hypokalemia, hyperphosphatemia 11/03/2021   Hypokalemia 11/03/2021   Hyperphosphatemia 11/03/2021   Hyponatremia 11/03/2021   Elevated brain natriuretic peptide (BNP) level 11/03/2021   Right proximal humeral fracture 11/03/2021   Oropharyngeal dysphagia 11/03/2021   Colostomy in place [facility]) 10/30/2021   Stricture and stenosis of esophagus 10/30/2021   Gastrostomy tube in place [facility]) 10/30/2021   AKI (acute kidney injury) ([place]) 10/25/2021   Anxiety 10/24/2021   Chronic pain 10/24/2021   Allergic rhinitis 10/24/2021   Primary insomnia 10/24/2021    Tobacco use disorder 10/24/2021   Underweight 10/24/2021   Acquired hammer toe of right foot 10/24/2021   Uncontrolled hypertension 10/24/2021   Urticaria 10/24/2021   Dry mouth 10/24/2021   Protein-calorie malnutrition, severe ([place]) 10/24/2021   Severe sepsis due to perforated sigmoid colon s/p Hartmann/colostomy 10/22/2021 10/22/2021   Substance induced mood disorder (HCC)     Orientation RESPIRATION BLADDER Height & Weight     Self, Time, Situation, Place  Normal Continent Weight: 60.8 kg Height:  '5\' 10"'$  (177.8 cm)  BEHAVIORAL SYMPTOMS/MOOD NEUROLOGICAL BOWEL NUTRITION STATUS      Continent Diet (regular)  AMBULATORY STATUS COMMUNICATION OF NEEDS Skin   Extensive Assist Verbally Surgical wounds                       Personal Care Assistance Level of Assistance  Bathing, Feeding, Dressing Bathing Assistance: Limited assistance Feeding assistance: Limited assistance Dressing Assistance: Limited assistance     Functional Limitations Info  Sight, Hearing, Speech Sight Info: Adequate Hearing Info: Adequate Speech Info: Adequate    SPECIAL CARE FACTORS FREQUENCY  PT (By licensed PT), OT (By licensed OT)     PT Frequency: 5 x weekly OT Frequency: 5 x weekly            Contractures Contractures Info: Not present    Additional Factors Info  Code Status Code Status Info: full             Current Medications (11/22/2021):  This is the current hospital active medication list Current Facility-Administered Medications  Medication Dose Route Frequency Provider Last Rate Last Admin   0.9 %  sodium  chloride infusion (Manually program via Guardrails IV Fluids)   Intravenous Once Allie Bossier, MD       0.9 %  sodium chloride infusion  250 mL Intravenous Continuous Cecilie Lowers T, MD 10 mL/hr at 11/22/21 0019 250 mL at 11/22/21 0019   acetaminophen (TYLENOL) suppository 650 mg  650 mg Rectal Q6H PRN Omar Person, NP   650 mg at 11/08/21 1822   antiseptic  oral rinse (BIOTENE) solution 15 mL  15 mL Mouth Rinse PRN Freddi Starr, MD       ascorbic acid (VITAMIN C) tablet 500 mg  500 mg Per Tube BID Ralene Ok, MD   500 mg at 11/22/21 0930   chlorhexidine (PERIDEX) 0.12 % solution 15 mL  15 mL Mouth Rinse BID Charlynne Cousins, MD   15 mL at 11/22/21 8242   Chlorhexidine Gluconate Cloth 2 % PADS 6 each  6 each Topical Daily Charlynne Cousins, MD   6 each at 11/22/21 0932   diphenhydrAMINE (BENADRYL) capsule 25 mg  25 mg Oral Q6H PRN Allie Bossier, MD   25 mg at 11/19/21 1318   feeding supplement (BOOST / RESOURCE BREEZE) liquid 1 Container  1 Container Oral BID BM Allie Bossier, MD   1 Container at 11/22/21 0932   fentaNYL (SUBLIMAZE) injection 12.5 mcg  12.5 mcg Intravenous Q2H PRN Erick Colace, NP   12.5 mcg at 11/22/21 1208   heparin injection 5,000 Units  5,000 Units Subcutaneous Q8H Brand Males, MD   5,000 Units at 11/22/21 0604   hydrALAZINE (APRESOLINE) injection 5 mg  5 mg Intravenous Q4H PRN Charlynne Cousins, MD   5 mg at 11/11/21 1715   hydrALAZINE (APRESOLINE) injection 5 mg  5 mg Intravenous Q6H Allie Bossier, MD   5 mg at 11/22/21 0930   ipratropium-albuterol (DUONEB) 0.5-2.5 (3) MG/3ML nebulizer solution 3 mL  3 mL Nebulization Q4H PRN Allie Bossier, MD       lidocaine (XYLOCAINE) 1 % (with pres) injection    PRN Sandi Mariscal, MD   5 mL at 11/16/21 1002   lip balm (CARMEX) ointment   Topical PRN Frederik Pear, MD   Given at 10/31/21 0200   LORazepam (ATIVAN) injection 0.5 mg  0.5 mg Intravenous Q6H PRN Erick Colace, NP   0.5 mg at 11/22/21 0603   MEDLINE mouth rinse  15 mL Mouth Rinse q12n4p Charlynne Cousins, MD   15 mL at 11/21/21 1330   megestrol (MEGACE) 400 MG/10ML suspension 400 mg  400 mg Oral BID Allie Bossier, MD   400 mg at 11/22/21 0928   metoprolol tartrate (LOPRESSOR) injection 5 mg  5 mg Intravenous Q6H Allie Bossier, MD   5 mg at 11/22/21 3536   nicotine (NICODERM CQ -  dosed in mg/24 hours) patch 14 mg  14 mg Transdermal Daily Maryjane Hurter, MD   14 mg at 11/22/21 0930   ondansetron (ZOFRAN) injection 4 mg  4 mg Intravenous Q8H PRN Jill Alexanders, PA-C   4 mg at 11/11/21 2134   pantoprazole (PROTONIX) injection 40 mg  40 mg Intravenous Q24H Donnie Mesa, MD   40 mg at 11/21/21 1351   piperacillin-tazobactam (ZOSYN) IVPB 3.375 g  3.375 g Intravenous Q8H Shade, Christine E, RPH 12.5 mL/hr at 11/22/21 0955 3.375 g at 11/22/21 0955   sodium chloride (OCEAN) 0.65 % nasal spray 1 spray  1 spray Each Nare PRN Hollace Hayward  K, NP       sodium chloride flush (NS) 0.9 % injection 10-40 mL  10-40 mL Intracatheter Q12H Maryjane Hurter, MD   10 mL at 11/21/21 2111   sodium chloride flush (NS) 0.9 % injection 10-40 mL  10-40 mL Intracatheter PRN Maryjane Hurter, MD       sodium chloride flush (NS) 0.9 % injection 5 mL  5 mL Intracatheter Q12H Han, Aimee H, PA-C   5 mL at 11/21/21 0916   sodium chloride flush (NS) 0.9 % injection 5 mL  5 mL Intracatheter Q8H Sandi Mariscal, MD   5 mL at 11/22/21 0604   TPN ADULT (ION)   Intravenous Continuous TPN Lynelle Doctor, RPH 95 mL/hr at 11/21/21 1811 New Bag at 11/21/21 1811   TPN ADULT (ION)   Intravenous Continuous TPN Lynelle Doctor, RPH       vitamin A capsule 10,000 Units  10,000 Units Per Tube Daily Ralene Ok, MD   10,000 Units at 11/22/21 5573     Discharge Medications: Please see discharge summary for a list of discharge medications.  Relevant Imaging Results:  Relevant Lab Results:   Additional Information [id number]  Leeroy Cha, RN

## 2021-11-22 NOTE — Progress Notes (Signed)
OT Cancellation Note ? ?Patient Details ?Name: Jessica [name] ?MRN: [id number] ?DOB: [birth year] ? ? ?Cancelled Treatment:    Reason Eval/Treat Not Completed: Other (comment): Pt refused, agitated as OT entered room. Pt raised voice and stated "What do you want?" Pt then apologized and stated "It's not a good time." ?Pt in recliner with CNA, Manuela Schwartz assisted and with needs being met. Pt agreed for OT to try again tomorrow as able.  ? ?Julien Girt ?11/22/2021, 1:15 PM ?

## 2021-11-22 NOTE — Progress Notes (Signed)
?PROGRESS NOTE ? ? ? ?Jessica [name]  SWN:[id number] DOB: [birth year] DOA: 10/22/2021 ?PCP: Deland Pretty, MD  ? ? ? ?Brief Narrative:  ?Jessica [name] is an 61 y.o. female past medical history significant for alcohol abuse, with admissions for severe alcohol withdrawals requiring ICU admissions tobacco use presented to the ED on 10/22/2021 diffuse abdominal pain that started the day prior to admission.  CT of the abdomen pelvis showed pneumoperitoneum and free fluid, severe sepsis with started on broad-spectrum antibiotics fluid resuscitated surgery was consulted was taken emergently to the OR underwent exploratory laparotomy and was found to have a perforated sigmoid colon, status post Hartmann procedure and insertion of the G-tube which was left to drain to gravity, JP in pelvis was noted to have feculent peritonitis in the pelvis.  ICU course was complicated by Klebsiella bacteremia profound caloric protein malnutrition requiring TPN, acute respiratory failure with hypoxia and tachycardia with failure to progress ? ? ?Subjective: ?5/16 Tmax overnight 38.1 ?C.  A/O x4 states has eaten some breakfast and lunch. ? ? ? ?Assessment & Plan: ?Covid vaccination; ?  ?Principal Problem: ?  Severe sepsis due to perforated sigmoid colon s/p Hartmann/colostomy 10/22/2021 ?Active Problems: ?  Acute respiratory failure with hypoxia ([place]) ?  Acute metabolic encephalopathy ?  Oropharyngeal dysphagia ?  Chronic pain ?  Tobacco use disorder ?  Protein-calorie malnutrition, severe ([place]) ?  AKI (acute kidney injury) ([place]) ?  Hypernatremia, hyponatremia, hypokalemia, hyperphosphatemia ?  Elevated brain natriuretic peptide (BNP) level ?  Right proximal humeral fracture ?  Normocytic anemia ?  Substance induced mood disorder ([place]) ?  Anxiety ?  Primary insomnia ?  Underweight ?  Colostomy in place [facility]) ?  Stricture and stenosis of esophagus ?  Gastrostomy tube in place [facility]) ?  Hyponatremia ?  Palliative care by specialist ?  Goals of  care, counseling/discussion ?  General weakness ?  Septic shock ([place]) ?  Postoperative intra-abdominal abscess ?  Colocutaneous fistula ?  Generalized abdominal pain ?  Aspiration pneumonia ([place]) ?  Bacteremia due to Klebsiella pneumoniae ?  Acute encephalopathy ?  Humerus fracture ? ?Severe septic shock due to perforated sigmoid colon  ?-4/15/ 2023 s/p Hartmann/colostomy  ?-10/21/2021 s/p G-tube placement.  Complicated by colocutaneous fistula with Pseudomonas intra-abdominal leak requiring percutaneous drainage: ?-10/29/2021 lower quadrant drain placed: Managed per IR ?Repeated CT scan on 11/10/2031 showed new pelvic collection not amenable to drainage likely an abscess. ?Patient is febrile, with a Tmax of 100.9. ?-CT on 10/10/2021 showed pelvic fluid collection not amenable to drainage.  Lesion abscess. ?Keep the patient n.p.o. continue NG tube and TNA and antibiotics per surgery. ?-Antibiotics were resumed on 11/14/2021. ?-5/10 s/p fluoro guided exchange up-sizing and repositioning of now 14 Fr drainage catheter into peri-stomal collection yielding feculent debris ?-5/11 CT abdomen and pelvis showing continued abscesses/fluid collection.  Will discuss in a.m. with CCS if now amenable to drainage ?-5/12 discussed case with PA Obie Dredge CCS and concurs that surgery at this time would be too high risk.  We will continue with antibiotics. ?- 5/13 discussed case with Dr. Jule Ser ID.  Secondary to complexity of case will evaluate patient today or tomorrow, will await recommendations.  ?-5/16 continue Zosyn per ID.  Undetermined prolonged period given uncontrolled intra-abdominal infection with abscess not amenable to drainage.  ? ?Septic shock/Aspiration pneumonia/Abdominal infection: ?-Has been weaned off pressors  ?-Strict in and out +12.2 L ?- Daily weight ?Filed Weights  ? 11/18/21 0500 11/19/21 0330 11/21/21  0500  ?Weight: 55.3 kg 59.9 kg 60.8 kg  ?   ? ?Acute respiratory failure with hypoxia/Aspiration  pneumonia: ?-Extubated on 10/27/2021 due to altered mental status had to be reintubated on 11/07/2021. ?-Extubated on 11/08/2021. ?-Currently n.p.o. ,now on room air. ?-Physical therapy is on board. Will need SNF vs LTAC. ?   ?Klebsiella bacteremia ?-Blood cultures on 10/23/2018 ?-Initially placed on vancomycin Flagyl and cefepime on 10/22/2021 ?-De-escalated to IV Zosyn 10/24/2021, she has now completed 2-week course of IV antibiotics for bacteremia. ?-Continue IV Zosyn per surgery for intra-abdominal infection ? ?Acute encephalopathy  ?-Multifactorial  ICU delirium, infectious etiology, EtOH withdrawal: ?-5/10 resolved . ?-Continue low-dose fentanyl for pain. ?-Limit sedating medication. ?-5/11 resolved ? ?AKI ?-Secondary to severe septic shock: ?-Was on IV diuresis, diuretics were held she is positive about 9 L. ? ?Lab Results  ?Component Value Date  ? CREATININE 0.75 11/22/2021  ? CREATININE 0.67 11/21/2021  ? CREATININE 0.84 11/20/2021  ? CREATININE 0.78 11/19/2021  ? CREATININE 0.90 11/18/2021  ?-5/11 resolved ? ?Essential HTN ?- 5/11 Metoprolol IV 5 mg TID ?-5/13 increase Metoprolol IV 5 mg QID ?-5/16 Metoprolol 12.5 mg BID ?-5/14 Hydralazine IV 5 mg QID ? ?   ?Normocytic anemia: ?-4/22 transfuse 1 unit PRBC ?-5/3 transfuse 1 unit PRBC ?-5/10 anemia panel pending ?Lab Results  ?Component Value Date  ? HGB 7.6 (L) 11/22/2021  ? HGB 8.2 (L) 11/21/2021  ? HGB 8.1 (L) 11/20/2021  ? HGB 8.4 (L) 11/19/2021  ? HGB 6.9 (LL) 11/19/2021  ?-5/12 transfuse for hemoglobin<7 ?-5/13 transfuse 1 unit PRBC ? ?Severe protein caloric malnutrition/cachexia: ?-Currently on TPN. ?-G-tube placed on 10/22/2021, ? ?Oropharyngeal dysphagia: ?-5/11 Speech concurs with continued n.p.o., TPN and antibiotics per surgery.  ?-5/12 patient much more alert and interactive reconsult speech for swallow evaluation. ?-5/15 patient passed swallow evaluation: Recommend regular diet with thin liquid ? ?Anorexia ?- Megace 400 mg BID ? ?RIGHT proximal humerus  fracture: ?-Status post fall in March 2023 seen by Ortho during that month with plan for nonweightbearing for 1 month then follow-up with them as an outpatient. ? ?Hypernatremia//hyponatremia: ?Lab Results  ?Component Value Date  ? NA 131 (L) 11/22/2021  ? NA 129 (L) 11/21/2021  ? NA 132 (L) 11/20/2021  ? NA 127 (L) 11/19/2021  ? NA 129 (L) 11/18/2021  ?-Slightly low, asymptomatic. ? ?  ?Hypokalemia ?-Potassium goal> 4 ?-5/10 potassium IV 50 mEq ? ?Hypomagnesmia ?- 5/10 Magnesium goal> 2 ?-5/15 Magnesium IV 3 g ? ?Hypocalcemia ?- Calcium goal > 8.9 ?-5/16 Corrected calcium= 9.3 ?  ?Substance induced mood disorder (HCC)/ Anxiety ? ? ?Pressure Injury 10/27/21 Vertebral column Mid;Medial Deep Tissue Pressure Injury - Purple or maroon localized area of discolored intact skin or blood-filled blister due to damage of underlying soft tissue from pressure and/or shear. (Active)  ?10/27/21 0428  ?Location: Vertebral column  ?Location Orientation: Mid;Medial  ?Staging: Deep Tissue Pressure Injury - Purple or maroon localized area of discolored intact skin or blood-filled blister due to damage of underlying soft tissue from pressure and/or shear.  ?Wound Description (Comments):   ?Present on Admission: No  ?Dressing Type Foam - Lift dressing to assess site every shift 11/21/21 1200  ?-Continue treatment per wound care ? ?  ? ? ?Mobility Assessment (last 72 hours)   ? ? Mobility Assessment   ? ? [place] Name 11/21/21 2033 11/21/21 1710 11/21/21 1200 11/20/21 2031 11/20/21 0830  ? Does patient have an order for bedrest or is patient medically unstable No -  Continue assessment -- No - Continue assessment No - Continue assessment No - Continue assessment  ? What is the highest level of mobility based on the progressive mobility assessment? Level 5 (Walks with assist in room/hall) - Balance while stepping forward/back and can walk in room with assist - Complete Level 5 (Walks with assist in room/hall) - Balance while stepping  forward/back and can walk in room with assist - Complete Level 5 (Walks with assist in room/hall) - Balance while stepping forward/back and can walk in room with assist - Complete -- Level 3 (Stands with assist) - B

## 2021-11-22 NOTE — Progress Notes (Signed)
SLP Cancellation Note ? ?Patient Details ?Name: Jessica [name] ?MRN: [id number] ?DOB: 10-07-1960 ? ? ?Cancelled treatment:       Reason Eval/Treat Not Completed: Patient unavailable ? ?Unable to assess diet tolerance at this time, as pt is currently with nursing. Will continue efforts. ? ?Hosey Burmester B. Theadore Blunck, MSP, CCC-SLP ?Speech Language Pathologist ?Office: [phone] ? ?Shonna Chock ?11/22/2021, 12:33 PM ?

## 2021-11-22 NOTE — Progress Notes (Signed)
PHARMACY - TOTAL PARENTERAL NUTRITION CONSULT NOTE  ? ?Indication: Prolonged ileus ? ?Patient Measurements: ?Height: _0  (177.8 cm) ?Weight: 60.8 kg (134 lb 0.6 oz) ?IBW/kg (Calculated) : 68.5 ?TPN AdjBW (KG): 53.8 ?Body mass index is 19.23 kg/m?. ?Usual Weight: 53.8 kg ? ?Assessment: 61 yo F w/ PMH etoh use disorder, smoker, HTN, GERD. S/p Exp Lap Hartmans 10/22/2021 for perforated sigmoid colon, Dr. Marcello Moores.   ? ?Glucose / Insulin: no hx DM.  CBGs continue to be <150.  (insulin d/c 5/1) ?Electrolytes: Na low but up 131 (max conc of Na in TPN), K 3.5; CorrCa 9.8, phos and Mag wnl  ?- goal for ileus: Mag > 2, K > 4, phos ~3 ?- CL and CO2 wnl ?Vitamins:   ?- Vitamin C <0.1 (4/22); receiving 200 mg ascorbic acid daily in TPN from MVI ?- Vitamin A 14.2 (4/22); receiving 1 mg of vitamin A daily in TPN from [facility] ?- Unable to add additional ascorbic acid and vitamin A to TPN per TPN policy ?- 5/5 - initiated ascorbic acid 500 mg per tube BID and vitamin A 10,000 units per tube daily. Tube to be clamped x1 hour after administration then returned to suction.  ?Renal: SCr WNL; BUN still elevated but trending down; UOP good ?Hepatic: LFTs, Tbili, Alk Phos WNL; TG stable WNL.  ?- Albumin remains low ?- prealbumin 18.2 on 5/13  ?Intake / Output:  ?- I/O: +436 mL ?-UOP: 1.3 ml/kg/hr.  ?- Drain output: 140 mL.  ?- Colostomy: 10 mL  ?- No IVF ?GI Imaging:  ?- 4/15 CTA: bowel perforation ?- 4/17 KUB ileus or pSBO ?- 4/20 CTA: severe ileus or pSBO ?- 5/04 CT: new pelvic collection not amenable to drainage, likely an abscess ?- 5/11 CTa/p: fluid collections unchanged from 5/4 ?GI Surgeries / Procedures:  ?- 10/22/21 s/p EXPLORATORY LAPAROTOMY hartmans for perforated sigmoid colon ?- 4/23 IR placed drain LLQ ?- 5/10 Drainage cath exchanged ? ?Central access: CVC TL placed 10/23/21 ?TPN start date: 10/26/21 ? ?Nutritional Goals: ?Goal TPN rate is 95 mL/hr (provides 109 g of protein and 2170 kcals per day)  ? ?RD Assessment:  (5/10) ?Estimated Needs ?Total Energy Estimated Needs: 2000-2200 kcal ?Total Protein Estimated Needs: 105-125 grams ?Total Fluid Estimated Needs: >/= 2.2 L/day ? ?Current Nutrition:  ?- TPN ?- Boost 1 container bid (pt refusing) ?- Diet advanced from NPO to CLD on 5/12 --> adv to soft diet on 5/15 (10-15% of meals consumed)  ? ?Plan:  ? ?Now:  ?- potassium chloride 20 meq PO x1 ? ?At 18:00:   ?Continue TPN @  95 mL/hr to provide 100% of goals ?Electrolytes in TPN:  ?Continue Na 150 mEq/L ?K  50 mEq/L ?Ca 3 mEq/L ?Mg 8 mEq/L ?Phos 5 mmol/L ?Change Cl:Ac to 1:1 ?Continue standard MVI and trace elements to TPN ?Continue folic acid & thiamine in TPN ?Monitor TPN labs on Mon/Thurs ?CMET, phos, mag ordered as daily by MD ?Follow up enteral intake for appropriate wean off TPN   ? ?Lynelle Doctor, PharmD, BCPS ?Clinical Pharmacist ?11/22/2021 7:02 AM ?

## 2021-11-22 NOTE — TOC Progression Note (Addendum)
Transition of Care (TOC) - Progression Note  ? ? ?Patient Details  ?Name: Jessica [name] ?MRN: [id number] ?Date of Birth: 07/13/1960 ? ?Transition of Care (TOC) CM/SW Contact  ?Leeroy Cha, RN ?Phone Number: ?11/22/2021, 8:07 AM ? ?Clinical Narrative:    ?Palliative care and toc following .  Pt has medicaid pending will be hard to place. ?Fl2 sent out to area snfs at 1248. ? ?Expected Discharge Plan: Home/Self Care (TBD) ?Barriers to Discharge: Continued Medical Work up ? ?Expected Discharge Plan and Services ?Expected Discharge Plan: Home/Self Care (TBD) ?  ?Discharge Planning Services: CM Consult ?  ?Living arrangements for the past 2 months: [place] ?                ?  ?  ?  ?  ?  ?  ?  ?  ?  ?  ? ? ?Social Determinants of Health (SDOH) Interventions ?  ? ?Readmission Risk Interventions ? ?  10/24/2021  ? 10:10 AM  ?Readmission Risk Prevention Plan  ?Transportation Screening Complete  ?PCP or Specialist Appt within 3-5 Days Complete  ?[place] or Home Care Consult Complete  ?Social Work Consult for [place] Planning/Counseling Complete  ?Palliative Care Screening Complete  ?Medication Review [employer]) Complete  ? ? ?

## 2021-11-22 NOTE — Progress Notes (Signed)
31 Days Post-Op  ? ?Subjective/Chief Complaint: ?NAEO. States she tolerated some solid food.  ?TMAX 100.5 yesterday that improved with tylenol  ? ? ?Objective: ?Vital signs in last 24 hours: ?Temp:  [97.8 ?F (36.6 ?C)-100.5 ?F (38.1 ?C)] 98.6 ?F (37 ?C) (05/16 0553) ?Pulse Rate:  [105-114] 109 (05/16 0553) ?Resp:  [19-21] 20 (05/16 0553) ?BP: (124-151)/(65-78) 151/78 (05/16 0553) ?SpO2:  [96 %-98 %] 97 % (05/16 0553) ?Last BM Date : 11/21/21 ? ?Intake/Output from previous day: ?05/15 0701 - 05/16 0700 ?In: 2536.5 [P.O.:1258; I.V.:1123.5; IV Piggyback:150] ?Out: 2100 [Urine:1850; Drains:140; Stool:10] ?Intake/Output this shift: ?No intake/output data recorded. ? ?General appearance: alert and cooperative ?Resp: clear to auscultation bilaterally ?Cardio: regular rate and rhythm ?GI: soft, appropriately tender. Ostomy and eakins pouch intact  draining feculent effluent  ? ?Lab Results:  ?Recent Labs  ?  11/21/21 ?0321 11/22/21 ?3154  ?WBC 7.6 7.1  ?HGB 8.2* 7.6*  ?HCT 24.3* 23.2*  ?PLT 233 196  ? ?BMET ?Recent Labs  ?  11/21/21 ?0321 11/22/21 ?0086  ?NA 129* 131*  ?K 3.8 3.5  ?CL 99 99  ?CO2 23 26  ?GLUCOSE 116* 128*  ?BUN 32* 33*  ?CREATININE 0.67 0.75  ?CALCIUM 7.6* 7.6*  ? ?PT/INR ?No results for input(s): LABPROT, INR in the last 72 hours. ?ABG ?No results for input(s): PHART, HCO3 in the last 72 hours. ? ?Invalid input(s): PCO2, PO2 ? ?Studies/Results: ?No results found. ? ?Anti-infectives: ?Anti-infectives (From admission, onward)  ? ? Start     Dose/Rate Route Frequency Ordered Stop  ? 11/14/21 0800  piperacillin-tazobactam (ZOSYN) IVPB 3.375 g       ? 3.375 g ?12.5 mL/hr over 240 Minutes Intravenous Every 8 hours 11/14/21 0739    ? 11/04/21 1000  piperacillin-tazobactam (ZOSYN) IVPB 3.375 g       ? 3.375 g ?12.5 mL/hr over 240 Minutes Intravenous Every 8 hours 11/04/21 0935 11/11/21 1312  ? 10/27/21 1600  cefTRIAXone (ROCEPHIN) 2 g in sodium chloride 0.9 % 100 mL IVPB  Status:  Discontinued       ? 2 g ?200  mL/hr over 30 Minutes Intravenous Every 24 hours 10/27/21 0843 10/27/21 1106  ? 10/27/21 1600  piperacillin-tazobactam (ZOSYN) IVPB 3.375 g       ? 3.375 g ?12.5 mL/hr over 240 Minutes Intravenous Every 8 hours 10/27/21 1106 11/03/21 2000  ? 10/27/21 1400  metroNIDAZOLE (FLAGYL) IVPB 500 mg  Status:  Discontinued       ? 500 mg ?100 mL/hr over 60 Minutes Intravenous Every 12 hours 10/27/21 0843 10/27/21 1106  ? 10/24/21 1600  piperacillin-tazobactam (ZOSYN) IVPB 3.375 g  Status:  Discontinued       ? 3.375 g ?12.5 mL/hr over 240 Minutes Intravenous Every 8 hours 10/24/21 1422 10/27/21 0843  ? 10/24/21 1000  cefTRIAXone (ROCEPHIN) 2 g in sodium chloride 0.9 % 100 mL IVPB  Status:  Discontinued       ? 2 g ?200 mL/hr over 30 Minutes Intravenous Every 24 hours 10/24/21 0747 10/24/21 1357  ? 10/23/21 1530  vancomycin (VANCOCIN) IVPB 1000 mg/200 mL premix       ? 1,000 mg ?200 mL/hr over 60 Minutes Intravenous  Once 10/23/21 1439 10/23/21 1547  ? 10/23/21 0200  ceFEPIme (MAXIPIME) 2 g in sodium chloride 0.9 % 100 mL IVPB  Status:  Discontinued       ? 2 g ?200 mL/hr over 30 Minutes Intravenous Every 12 hours 10/22/21 2032 10/24/21 0747  ? 10/22/21 2200  cefOXitin (MEFOXIN) 2 g in sodium chloride 0.9 % 100 mL IVPB  Status:  Discontinued       ? 2 g ?200 mL/hr over 30 Minutes Intravenous Every 8 hours 10/22/21 1706 10/22/21 2032  ? 10/22/21 1956  vancomycin variable dose per unstable renal function (pharmacist dosing)  Status:  Discontinued       ?  Does not apply See admin instructions 10/22/21 1957 10/23/21 1917  ? 10/22/21 1245  vancomycin (VANCOCIN) IVPB 1000 mg/200 mL premix       ? 1,000 mg ?200 mL/hr over 60 Minutes Intravenous  Once 10/22/21 1241 10/22/21 1402  ? 10/22/21 1245  ceFEPIme (MAXIPIME) 2 g in sodium chloride 0.9 % 100 mL IVPB       ? 2 g ?200 mL/hr over 30 Minutes Intravenous  Once 10/22/21 1241 10/22/21 1321  ? 10/22/21 1230  metroNIDAZOLE (FLAGYL) IVPB 500 mg  Status:  Discontinued       ? 500 mg ?100  mL/hr over 60 Minutes Intravenous Every 12 hours 10/22/21 1222 10/24/21 1357  ? ?  ? ? ?Assessment/Plan: ?s/p Procedure(s): ?EXPLORATORY LAPAROTOMY hartmans (N/A) ?POD#31 - PERFORATED SIGMOID COLON - status post EX LAP, HARTMAN'S PROCEDURE, Gastrostomy tube placement - 5/95/6387 Dr. 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 WOCN following for colostomy and pouching of fistula ?- CT 5/4 also with pelvic fluid collection measuring 6 x 3.2 cm. Discussed with IR and not felt amenable/safe for drainage. IV abx. Repeat CT 5/11 w/ stable pelvic abscess that remains unable to be safely accessed per IR (Dr. Kathlene Cote) ?-  G-tube clamped 5/7. Patient tolerating. Tolerating ice chips/sips. SLP following  ?FEN - G-tube capped, TPN, diet SOFT starting today; prealbumin 18.2 on 5/13 ?VTE - SCDs, SQH ?ID - zosyn 4/17 - 4/27; 4/28-5/6, 5/8 >> ?  ?Continue IV abx - do not have source control at this time, pelvic abscess not amenable to drainage.continue diet per SLP and monitor drain/fistula output. No emergent surgical needs. Continue to try to avoid surgery if possible as she is only 4 weeks out from her last operation - her abdomen would be scarred in and high risk for complications, not to mention she has not had any enteral nutrition since surgery.  We will continue to follow closely. Check prealbumin in AM. ? ? LOS: 31 days  ? ? ?[place] ?11/22/2021 ? ?

## 2021-11-23 ENCOUNTER — Inpatient Hospital Stay (HOSPITAL_COMMUNITY): Payer: Self-pay

## 2021-11-23 LAB — COMPREHENSIVE METABOLIC PANEL
ALT: 20 U/L (ref 0–44)
AST: 16 U/L (ref 15–41)
Albumin: 1.8 g/dL — ABNORMAL LOW (ref 3.5–5.0)
Alkaline Phosphatase: 79 U/L (ref 38–126)
Anion gap: 6 (ref 5–15)
BUN: 33 mg/dL — ABNORMAL HIGH (ref 6–20)
CO2: 22 mmol/L (ref 22–32)
Calcium: 7.4 mg/dL — ABNORMAL LOW (ref 8.9–10.3)
Chloride: 101 mmol/L (ref 98–111)
Creatinine, Ser: 0.83 mg/dL (ref 0.44–1.00)
GFR, Estimated: >60 mL/min (ref >60)
Glucose, Bld: 110 mg/dL — ABNORMAL HIGH (ref 70–99)
Potassium: 4 mmol/L (ref 3.5–5.1)
Sodium: 129 mmol/L — ABNORMAL LOW (ref 135–145)
Total Bilirubin: 0.5 mg/dL (ref 0.3–1.2)
Total Protein: 5.7 g/dL — ABNORMAL LOW (ref 6.5–8.1)

## 2021-11-23 LAB — CBC WITH DIFFERENTIAL/PLATELET
Abs Immature Granulocytes: 0.03 10*3/uL (ref 0.00–0.07)
Basophils Absolute: 0 10*3/uL (ref 0.0–0.1)
Basophils Relative: 0 %
Eosinophils Absolute: 0.4 10*3/uL (ref 0.0–0.5)
Eosinophils Relative: 6 %
HCT: 22.5 % — ABNORMAL LOW (ref 36.0–46.0)
Hemoglobin: 7.3 g/dL — ABNORMAL LOW (ref 12.0–15.0)
Immature Granulocytes: 0 %
Lymphocytes Relative: 32 %
Lymphs Abs: 2.2 10*3/uL (ref 0.7–4.0)
MCH: 29.4 pg (ref 26.0–34.0)
MCHC: 32.4 g/dL (ref 30.0–36.0)
MCV: 90.7 fL (ref 80.0–100.0)
Monocytes Absolute: 0.7 10*3/uL (ref 0.1–1.0)
Monocytes Relative: 10 %
Neutro Abs: 3.5 10*3/uL (ref 1.7–7.7)
Neutrophils Relative %: 52 %
Platelets: 188 10*3/uL (ref 150–400)
RBC: 2.48 MIL/uL — ABNORMAL LOW (ref 3.87–5.11)
RDW: 16.9 % — ABNORMAL HIGH (ref 11.5–15.5)
WBC: 6.8 10*3/uL (ref 4.0–10.5)
nRBC: 0 % (ref 0.0–0.2)

## 2021-11-23 LAB — GLUCOSE, CAPILLARY
Glucose-Capillary: 121 mg/dL — ABNORMAL HIGH (ref 70–99)
Glucose-Capillary: 122 mg/dL — ABNORMAL HIGH (ref 70–99)
Glucose-Capillary: 123 mg/dL — ABNORMAL HIGH (ref 70–99)
Glucose-Capillary: 127 mg/dL — ABNORMAL HIGH (ref 70–99)

## 2021-11-23 LAB — PHOSPHORUS: Phosphorus: 3.5 mg/dL (ref 2.5–4.6)

## 2021-11-23 LAB — MAGNESIUM: Magnesium: 2 mg/dL (ref 1.7–2.4)

## 2021-11-23 MED ORDER — ENSURE ENLIVE PO LIQD
237.0000 mL | Freq: Two times a day (BID) | ORAL | Status: DC
  Administered 2021-11-24 – 2021-11-27 (×3): 237 mL via ORAL

## 2021-11-23 MED ORDER — TRAVASOL 10 % IV SOLN
INTRAVENOUS | Status: AC
  Filled 2021-11-23: qty 1094.4

## 2021-11-23 MED ORDER — METOPROLOL TARTRATE 25 MG PO TABS
25.0000 mg | ORAL_TABLET | Freq: Two times a day (BID) | ORAL | Status: DC
  Administered 2021-11-23 – 2021-11-24 (×3): 25 mg via ORAL
  Filled 2021-11-23 (×2): qty 1

## 2021-11-23 MED ORDER — IOHEXOL 9 MG/ML PO SOLN
500.0000 mL | ORAL | Status: AC
  Administered 2021-11-23 (×2): 500 mL via ORAL

## 2021-11-23 MED ORDER — METOPROLOL TARTRATE 5 MG/5ML IV SOLN
2.5000 mg | INTRAVENOUS | Status: DC | PRN
  Filled 2021-11-23: qty 5

## 2021-11-23 MED ORDER — IOHEXOL 300 MG/ML  SOLN
100.0000 mL | Freq: Once | INTRAMUSCULAR | Status: AC | PRN
Start: 2021-11-23 — End: 2021-11-23
  Administered 2021-11-23: 100 mL via INTRAVENOUS

## 2021-11-23 MED ORDER — SODIUM CHLORIDE (PF) 0.9 % IJ SOLN
INTRAMUSCULAR | Status: AC
  Filled 2021-11-23: qty 50

## 2021-11-23 NOTE — Progress Notes (Signed)
SLP Cancellation Note ? ?Patient Details ?Name: Jessica [name] ?MRN: [id number] ?DOB: 29-Jul-1960 ? ? ?Cancelled treatment:       Reason Eval/Treat Not Completed: Other (comment). Pt sleeping all am; planned to return for lunch but pt now for CT abdomen. Will f/u as needed ? ? ?Shawan Tosh, Katherene Ponto ?11/23/2021, 1:28 PM ?

## 2021-11-23 NOTE — Progress Notes (Signed)
OT Cancellation Note ? ?Patient Details ?Name: Jessica [name] ?MRN: [id number] ?DOB: 1961/06/25 ? ? ?Cancelled Treatment:    Reason Eval/Treat Not Completed: Other (comment). Declined therapy due to lack of rest and going to be going to CT for imaging. Will follow up as able. ? ?Lenward Chancellor ?11/23/2021, 1:28 PM ?

## 2021-11-23 NOTE — Progress Notes (Signed)
?PROGRESS NOTE ? ? ? ?Jessica [name]  CBJ:[id number] DOB: February 05, 1961 DOA: 10/22/2021 ?PCP: Deland Pretty, MD  ? ?Brief Narrative:  ?61 year old female with history of alcohol abuse, several alcohol withdrawals requiring admissions, tobacco use presented on 10/22/2021 with diffuse abdominal pain with CT of abdomen and pelvis showed pneumoperitoneum and free fluid.  She was started on broad-spectrum antibiotics and emergently taken to the OR and underwent exploratory laparotomy where she was found to have a perforated sigmoid colon.  She is status post Hartmann procedure and insertion of G-tube.  She was found to have Klebsiella bacteremia.  Hospital course complicated by development of left lower quadrant fluid collection status post IR drainage on 10/29/2021, drain cultures grew Pseudomonas.  This required having her drain adjusted and upsized on 510, fluid appeared feculent.  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  She had a repeat CT on 11/10/2021 with pelvic fluid collection measuring 6 x 3.2 cm.  This was discussed with IR and it was not felt safe or amenable to drainage.  She was continued on IV antibiotics.  ID was consulted.  Repeat CT scan showed stable pelvic abscess also remaining unable to be drained.  She is currently on TPN.  Palliative care was also consulted for goals of care discussion.  Currently remains full code. ? ?Assessment & Plan: ?  ?Complicated intra-abdominal infection ?Perforated sigmoid colon status post expiratory laparotomy/Hartman's procedure/G-tube placement ?Septic shock: Present on admission, resolved ?Klebsiella bacteremia ?-Hospital course as above. ?-As per recent ID recommendations, patient has been on IV Zosyn: Undermined prolonged.  Given uncontrolled intra-abdominal infection with abscess not amenable to drainage. ?-General surgery following.  Continue TPN low-grade temperatures recently.  Follow blood cultures.  Follow  general surgery recommendations.  Wound care as per general surgery and wound care nurse recommendations.  Wound care nurse following for colostomy and pouching of fistula ?-Septic shock has already resolved.  Off pressors ? ?Acute respiratory failure with hypoxia/aspiration pneumonia ?-Has required intubation and extubation twice.  Extubated again on 11/08/2021.  Currently on room air. ? ?Acute metabolic encephalopathy ?-Multifactorial due to ICU delirium/infectious etiology/alcohol withdrawal ?-Mental status has mostly improved.  Monitor mental status. ?-Fall precautions ? ?AKI ?-Resolved. ? ?Essential hypertension ?-Monitor blood pressure.  Continue metoprolol and hydralazine ? ?Anemia of chronic disease ?-Has required 3 units packed red cells transfusion during the hospitalization ?-Transfuse if hemoglobin is less than 7 ? ?Hyponatremia ?-Sodium ranging from 129-1 32.  Currently on TPN.  Monitor ? ?Severe protein calorie malnutrition hypoalbuminemia ?Anorexia ?-Due to poor oral intake.  Continue TPN.  Nutrition following. ?-Currently on Megace as well ? ?Hypomagnesemia ?-Resolved ? ?Substance-induced mood disorder/anxiety ?-Continue lorazepam as needed. ? ?Physical deconditioning ?-Will need SNF placement ? ?DVT prophylaxis: Heparin subcutaneous ?Code Status: Full ?Family Communication: None at bedside ?Disposition Plan: ?Status is: Inpatient ?Remains inpatient appropriate because: Of severity of illness ? ? ? ?Consultants: General surgery/palliative care/ID/PCCM/IR ? ?Procedures: As above ? ?Antimicrobials:  ?Anti-infectives (From admission, onward)  ? ? Start     Dose/Rate Route Frequency Ordered Stop  ? 11/14/21 0800  piperacillin-tazobactam (ZOSYN) IVPB 3.375 g       ? 3.375 g ?12.5 mL/hr over 240 Minutes Intravenous Every 8 hours 11/14/21 0739    ? 11/04/21 1000  piperacillin-tazobactam (ZOSYN) IVPB 3.375 g       ? 3.375 g ?12.5 mL/hr over 240 Minutes Intravenous Every 8 hours 11/04/21 0935 11/11/21 1312  ?  10/27/21 1600  cefTRIAXone (ROCEPHIN) 2 g in sodium chloride 0.9 % 100 mL IVPB  Status:  Discontinued       ? 2 g ?200 mL/hr over 30 Minutes Intravenous Every 24 hours 10/27/21 0843 10/27/21 1106  ? 10/27/21 1600  piperacillin-tazobactam (ZOSYN) IVPB 3.375 g       ? 3.375 g ?12.5 mL/hr over 240 Minutes Intravenous Every 8 hours 10/27/21 1106 11/03/21 2000  ? 10/27/21 1400  metroNIDAZOLE (FLAGYL) IVPB 500 mg  Status:  Discontinued       ? 500 mg ?100 mL/hr over 60 Minutes Intravenous Every 12 hours 10/27/21 0843 10/27/21 1106  ? 10/24/21 1600  piperacillin-tazobactam (ZOSYN) IVPB 3.375 g  Status:  Discontinued       ? 3.375 g ?12.5 mL/hr over 240 Minutes Intravenous Every 8 hours 10/24/21 1422 10/27/21 0843  ? 10/24/21 1000  cefTRIAXone (ROCEPHIN) 2 g in sodium chloride 0.9 % 100 mL IVPB  Status:  Discontinued       ? 2 g ?200 mL/hr over 30 Minutes Intravenous Every 24 hours 10/24/21 0747 10/24/21 1357  ? 10/23/21 1530  vancomycin (VANCOCIN) IVPB 1000 mg/200 mL premix       ? 1,000 mg ?200 mL/hr over 60 Minutes Intravenous  Once 10/23/21 1439 10/23/21 1547  ? 10/23/21 0200  ceFEPIme (MAXIPIME) 2 g in sodium chloride 0.9 % 100 mL IVPB  Status:  Discontinued       ? 2 g ?200 mL/hr over 30 Minutes Intravenous Every 12 hours 10/22/21 2032 10/24/21 0747  ? 10/22/21 2200  cefOXitin (MEFOXIN) 2 g in sodium chloride 0.9 % 100 mL IVPB  Status:  Discontinued       ? 2 g ?200 mL/hr over 30 Minutes Intravenous Every 8 hours 10/22/21 1706 10/22/21 2032  ? 10/22/21 1956  vancomycin variable dose per unstable renal function (pharmacist dosing)  Status:  Discontinued       ?  Does not apply See admin instructions 10/22/21 1957 10/23/21 1917  ? 10/22/21 1245  vancomycin (VANCOCIN) IVPB 1000 mg/200 mL premix       ? 1,000 mg ?200 mL/hr over 60 Minutes Intravenous  Once 10/22/21 1241 10/22/21 1402  ? 10/22/21 1245  ceFEPIme (MAXIPIME) 2 g in sodium chloride 0.9 % 100 mL IVPB       ? 2 g ?200 mL/hr over 30 Minutes Intravenous  Once  10/22/21 1241 10/22/21 1321  ? 10/22/21 1230  metroNIDAZOLE (FLAGYL) IVPB 500 mg  Status:  Discontinued       ? 500 mg ?100 mL/hr over 60 Minutes Intravenous Every 12 hours 10/22/21 1222 10/24/21 1357  ? ?  ? ? ? ?Subjective: ?Patient seen and examined at bedside.  Does not feel well and feels tired.  And feels tired.  Does not want to be bothered and just wants to sleep/rest.  Not eating much.  No overnight seizures, vomiting, worsening abdominal pain reported.  Had fever overnight. ?Objective: ?Vitals:  ? 11/23/21 0044 11/23/21 0338 11/23/21 0410 11/23/21 1116  ?BP:  (!) 146/78  (!) 141/63  ?Pulse:  (!) 104    ?Resp: (!) '22 20  20  '$ ?Temp: 99.8 ?F (37.7 ?C)  99 ?F (37.2 ?C) 99.6 ?F (37.6 ?C)  ?TempSrc:   Axillary Oral  ?SpO2:  98%  97%  ?Weight:      ?Height:      ? ? ?Intake/Output Summary (Last 24 hours) at 11/23/2021 1413 ?Last data filed at 11/23/2021 1332 ?Gross per 24 hour  ?Intake 2595.95  ml  ?Output 2625 ml  ?Net -29.05 ml  ? ?Filed Weights  ? 11/18/21 0500 11/19/21 0330 11/21/21 0500  ?Weight: 55.3 kg 59.9 kg 60.8 kg  ? ? ?Examination: ? ?General exam: Appears calm and comfortable.  Looks chronically ill and deconditioned.  Currently on room air. ?Respiratory system: Bilateral decreased breath sounds at bases with some scattered crackles and intermittent tachypnea ?Cardiovascular system: S1 & S2 heard, intermittently tachycardic ?Gastrointestinal system: Abdomen is distended, soft and mildly tender diffusely. Normal bowel sounds heard.  Ostomy bag and Eakin's pouch present ?Extremities: No cyanosis, clubbing, edema  ?Central nervous system: Alert and oriented.  Slow to respond.  Poor historian.  No focal neurological deficits. Moving extremities ?Skin: No obvious petechiae/ecchymosis  ?psychiatry: Affect is flat.  No signs of agitation.  Does not want to participate in conversation much. ? ? ? ?Data Reviewed: I have personally reviewed following labs and imaging studies ? ?CBC: ?Recent Labs  ?Lab  11/19/21 ?0815 11/19/21 ?1745 11/20/21 ?0426 11/21/21 ?0321 11/22/21 ?8563 11/23/21 ?0329  ?WBC 7.8  --  7.2 7.6 7.1 6.8  ?NEUTROABS 4.6  --  4.2 4.0 4.2 3.5  ?HGB 6.9* 8.4* 8.1* 8.2* 7.6* 7.3*  ?HCT 20.6* 25.2* 24.4

## 2021-11-23 NOTE — Progress Notes (Signed)
PHARMACY - TOTAL PARENTERAL NUTRITION CONSULT NOTE  ? ?Indication: Prolonged ileus ? ?Patient Measurements: ?Height: _0  (177.8 cm) ?Weight: 60.8 kg (134 lb 0.6 oz) ?IBW/kg (Calculated) : 68.5 ?TPN AdjBW (KG): 53.8 ?Body mass index is 19.23 kg/m?. ?Usual Weight: 53.8 kg ? ?Assessment: 61 yo F w/ PMH etoh use disorder, smoker, HTN, GERD. S/p Exp Lap Hartmans 10/22/2021 for perforated sigmoid colon, Dr. Marcello Moores.   ? ?Glucose / Insulin: no hx DM.  CBGs continue to be <150.  (insulin d/c 5/1) ?Electrolytes: Na low 129 (max conc of Na in TPN); phos wnl but trending up, CorrCa, K and Mag wnl  ?- goal for ileus: Mag > 2, K > 4, phos ~3 ?- CL and CO2 wnl ?Vitamins:   ?- Vitamin C <0.1 (4/22); receiving 200 mg ascorbic acid daily in TPN from MVI ?- Vitamin A 14.2 (4/22); receiving 1 mg of vitamin A daily in TPN from [facility] ?- Unable to add additional ascorbic acid and vitamin A to TPN per TPN policy ?- 5/5 - initiated ascorbic acid 500 mg per tube BID and vitamin A 10,000 units per tube daily. Tube to be clamped x1 hour after administration then returned to suction.  ?Renal: SCr WNL; BUN 33 ?Hepatic: LFTs, Tbili, Alk Phos WNL; TG stable WNL.  ?- Albumin remains low ?- prealbumin 18.2 on 5/13  ?Intake / Output:  ?- I/O: +540 mL ?-UOP: 1.2 ml/kg/hr.  ?- Drain output: 75 mL.  ?- Colostomy: 0 mL  ?- No IVF ?GI Imaging:  ?- 4/15 CTA: bowel perforation ?- 4/17 KUB ileus or pSBO ?- 4/20 CTA: severe ileus or pSBO ?- 5/04 CT: new pelvic collection not amenable to drainage, likely an abscess ?- 5/11 CTa/p: fluid collections unchanged from 5/4 ?GI Surgeries / Procedures:  ?- 10/22/21 s/p EXPLORATORY LAPAROTOMY hartmans for perforated sigmoid colon ?- 4/23 IR placed drain LLQ ?- 5/10 Drainage cath exchanged ? ?Central access: CVC TL placed 10/23/21 ?TPN start date: 10/26/21 ? ?Nutritional Goals: ?Goal TPN rate is 95 mL/hr (provides 109 g of protein and 2170 kcals per day)  ? ?RD Assessment: (5/10) ?Estimated Needs ?Total Energy Estimated  Needs: 2000-2200 kcal ?Total Protein Estimated Needs: 105-125 grams ?Total Fluid Estimated Needs: >/= 2.2 L/day ? ?Current Nutrition:  ?- TPN ?- Boost 1 container bid (pt has been refusing, but took all her doses on 5/16) ?- Diet advanced from NPO to CLD on 5/12 --> adv to soft diet on 5/15  ? ?- on megace ?- 5/17: RN noted that appetite is poor on 5/16 ? ?Plan:  ? ?At 18:00:   ?Continue TPN @  95 mL/hr to provide 100% of goals ?Electrolytes in TPN:  ?Continue Na 150 mEq/L ?K  50 mEq/L ?Ca 3 mEq/L ?Mg 8 mEq/L ?Reduce Phos to 3 mmol/L ?Change Cl:Ac to 1:2 ?Continue standard MVI and trace elements to TPN ?Continue folic acid & thiamine in TPN ?Monitor TPN labs on Mon/Thurs ?CMET, phos, mag ordered as daily by MD ?Follow up enteral intake for appropriate wean off TPN   ? ?Lynelle Doctor, PharmD, BCPS ?Clinical Pharmacist ?11/23/2021 7:01 AM ?

## 2021-11-23 NOTE — Progress Notes (Addendum)
32 Days Post-Op  ? ?Subjective/Chief Complaint: ?No new complaints - states she wants to sleep/rest. Tolerating solid food but not eating much. Denies changes in pain. Having some night sweats.  ? ?TMAX 100.8 ?HR currently 104 but HR max 118bpm last 24h. ? ? ?Objective: ?Vital signs in last 24 hours: ?Temp:  [98.3 ?F (36.8 ?C)-100.8 ?F (38.2 ?C)] 99 ?F (37.2 ?C) (05/17 0410) ?Pulse Rate:  [104-118] 104 (05/17 0338) ?Resp:  [18-22] 20 (05/17 0338) ?BP: (143-164)/(71-90) 146/78 (05/17 1660) ?SpO2:  [96 %-98 %] 98 % (05/17 0338) ?Last BM Date : 11/21/21 ? ?Intake/Output from previous day: ?05/16 0701 - 05/17 0700 ?In: 2416 [P.O.:420; I.V.:1846; IV Piggyback:150] ?Out: 1875 [Urine:1800; Drains:75] ?Intake/Output this shift: ?Total I/O ?In: 64 [P.O.:60] ?Out: 0  ? ?General appearance: alert and cooperative ?Resp: clear to auscultation bilaterally ?Cardio: regular rate and rhythm ?GI: soft, appropriately tender. Ostomy and eakins pouch intact  draining feculent effluent  ? ?Lab Results:  ?Recent Labs  ?  11/22/21 ?6301 11/23/21 ?0329  ?WBC 7.1 6.8  ?HGB 7.6* 7.3*  ?HCT 23.2* 22.5*  ?PLT 196 188  ? ?BMET ?Recent Labs  ?  11/22/21 ?6010 11/23/21 ?0329  ?NA 131* 129*  ?K 3.5 4.0  ?CL 99 101  ?CO2 26 22  ?GLUCOSE 128* 110*  ?BUN 33* 33*  ?CREATININE 0.75 0.83  ?CALCIUM 7.6* 7.4*  ? ?PT/INR ?No results for input(s): LABPROT, INR in the last 72 hours. ?ABG ?No results for input(s): PHART, HCO3 in the last 72 hours. ? ?Invalid input(s): PCO2, PO2 ? ?Studies/Results: ?No results found. ? ?Anti-infectives: ?Anti-infectives (From admission, onward)  ? ? Start     Dose/Rate Route Frequency Ordered Stop  ? 11/14/21 0800  piperacillin-tazobactam (ZOSYN) IVPB 3.375 g       ? 3.375 g ?12.5 mL/hr over 240 Minutes Intravenous Every 8 hours 11/14/21 0739    ? 11/04/21 1000  piperacillin-tazobactam (ZOSYN) IVPB 3.375 g       ? 3.375 g ?12.5 mL/hr over 240 Minutes Intravenous Every 8 hours 11/04/21 0935 11/11/21 1312  ? 10/27/21 1600   cefTRIAXone (ROCEPHIN) 2 g in sodium chloride 0.9 % 100 mL IVPB  Status:  Discontinued       ? 2 g ?200 mL/hr over 30 Minutes Intravenous Every 24 hours 10/27/21 0843 10/27/21 1106  ? 10/27/21 1600  piperacillin-tazobactam (ZOSYN) IVPB 3.375 g       ? 3.375 g ?12.5 mL/hr over 240 Minutes Intravenous Every 8 hours 10/27/21 1106 11/03/21 2000  ? 10/27/21 1400  metroNIDAZOLE (FLAGYL) IVPB 500 mg  Status:  Discontinued       ? 500 mg ?100 mL/hr over 60 Minutes Intravenous Every 12 hours 10/27/21 0843 10/27/21 1106  ? 10/24/21 1600  piperacillin-tazobactam (ZOSYN) IVPB 3.375 g  Status:  Discontinued       ? 3.375 g ?12.5 mL/hr over 240 Minutes Intravenous Every 8 hours 10/24/21 1422 10/27/21 0843  ? 10/24/21 1000  cefTRIAXone (ROCEPHIN) 2 g in sodium chloride 0.9 % 100 mL IVPB  Status:  Discontinued       ? 2 g ?200 mL/hr over 30 Minutes Intravenous Every 24 hours 10/24/21 0747 10/24/21 1357  ? 10/23/21 1530  vancomycin (VANCOCIN) IVPB 1000 mg/200 mL premix       ? 1,000 mg ?200 mL/hr over 60 Minutes Intravenous  Once 10/23/21 1439 10/23/21 1547  ? 10/23/21 0200  ceFEPIme (MAXIPIME) 2 g in sodium chloride 0.9 % 100 mL IVPB  Status:  Discontinued       ?  2 g ?200 mL/hr over 30 Minutes Intravenous Every 12 hours 10/22/21 2032 10/24/21 0747  ? 10/22/21 2200  cefOXitin (MEFOXIN) 2 g in sodium chloride 0.9 % 100 mL IVPB  Status:  Discontinued       ? 2 g ?200 mL/hr over 30 Minutes Intravenous Every 8 hours 10/22/21 1706 10/22/21 2032  ? 10/22/21 1956  vancomycin variable dose per unstable renal function (pharmacist dosing)  Status:  Discontinued       ?  Does not apply See admin instructions 10/22/21 1957 10/23/21 1917  ? 10/22/21 1245  vancomycin (VANCOCIN) IVPB 1000 mg/200 mL premix       ? 1,000 mg ?200 mL/hr over 60 Minutes Intravenous  Once 10/22/21 1241 10/22/21 1402  ? 10/22/21 1245  ceFEPIme (MAXIPIME) 2 g in sodium chloride 0.9 % 100 mL IVPB       ? 2 g ?200 mL/hr over 30 Minutes Intravenous  Once 10/22/21 1241  10/22/21 1321  ? 10/22/21 1230  metroNIDAZOLE (FLAGYL) IVPB 500 mg  Status:  Discontinued       ? 500 mg ?100 mL/hr over 60 Minutes Intravenous Every 12 hours 10/22/21 1222 10/24/21 1357  ? ?  ? ? ?Assessment/Plan: ?s/p Procedure(s): ?EXPLORATORY LAPAROTOMY hartmans (N/A) ?POD#32 - PERFORATED SIGMOID COLON - status post EX LAP, HARTMAN'S PROCEDURE, Gastrostomy tube placement - 10/06/5186 Dr. 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 WOCN following for colostomy and pouching of fistula ?- CT 5/4 also with pelvic fluid collection measuring 6 x 3.2 cm. Discussed with IR and not felt amenable/safe for drainage. IV abx. Repeat CT 5/11 w/ stable pelvic abscess that remains unable to be safely accessed per IR (Dr. Kathlene Cote) ?-  G-tube clamped 5/7. Patient tolerating. Tolerating ice chips/sips. SLP following  ?FEN - G-tube capped, TPN, diet SOFT starting today; prealbumin 18.2 on 5/13 ?VTE - SCDs, SQH ?ID - zosyn 4/17 - 4/27; 4/28-5/6, 5/8 >> ?  ?Continue IV abx - do not have source control at this time, pelvic abscess not amenable to drainage. Low grade fever and tachycardia for the last 48 hours. WBC has been WNL. At 22 RN notified me of HR sustaining in the 130's -- she gave metoprolol. EKG Ordered. CT scan of the abdomen/pelvis today ordered. BCx ordered as well given PICC Line that has been in place since 4/28. ? ? ? continue diet per SLP and monitor drain/fistula output. No emergent surgical needs. Continue to try to avoid surgery if possible as she is only 4 weeks out from her last operation - her abdomen would be scarred in and high risk for complications, not to mention  she has not had any enteral nutrition since surgery.  We will continue to follow closely. ? ? LOS: 32 days  ? ? ?[place] ?11/23/2021 ? ?

## 2021-11-23 NOTE — Progress Notes (Signed)
Nutrition Follow-up ? ?DOCUMENTATION CODES:  ? ?Non-severe (moderate) malnutrition in context of chronic illness, Underweight ? ?INTERVENTION:  ? ?-Ensure Plus High Protein po BID, each supplement provides 350 kcal and 20 grams of protein.  ? ?-TPN management per Pharmacy ? ?NUTRITION DIAGNOSIS:  ? ?Moderate Malnutrition related to chronic illness (alcohol abuse) as evidenced by moderate fat depletion, moderate muscle depletion, severe muscle depletion. ? ?Ongoing. ? ?GOAL:  ? ?Patient will meet greater than or equal to 90% of their needs ? ?Now on soft diet. ? ?MONITOR:  ? ?PO intake, Supplement acceptance, Diet advancement, Labs, Weight trends, Other (Comment), I & O's (TPN regimen) ? ?ASSESSMENT:  ? ?61 year old female with medical history of tobacco abuse and alcohol use disorder with prior severe withdrawal/DT requiring ICU admission and precede. She presented to the ED due to severe, diffuse abdominal pain that began the night prior and constipation x4 days. She reported frequent alcohol ingestion and last drink was the day PTA. In ED she was found to have pneumoperitoneum and free fluid on CT abdomen/pelvis and severe lactic acidosis. She was started on broad spectrum antibiotics and has been given a total of 5L of crystalloid. Surgery was consulted and she was taken to OR where she underwent ex lap and was found to have perforated sigmoid colon, then had hartman's procedure and insertion of g-tube which was left to gravity drainage, JP bulb in pelvis. She was noted to have feculent peritonitis in the pelvis. ? ?Significant Events:  ?4/15- admission; ex lap with Hartman's procedure with colostomy and insertion of feeding G-tube d/t feculent peritonitis and sigmoid perforation ?4/17- re-intubated; OGT insertion; initial RD assessment ?4/18- OGT dislodgement ?4/19- TPN initiation ?4/20- initiation of TF at 20 ml/hr; extubation ?4/21- large volume emesis; G-tube clamped and then placed to LIWS; drainage of LLQ  fluid collectomy ?4/22- checked vitamins A, B12, C, and D with 0500 labs ?4/28- triple lumen PICC placed in L brachial ?5/1- re-intubation ?5/3- extubation ?5/4- CT abdomen/pelvis for r/o fistula ?5/5- vitamin A and vitamin C supplementation initiated via G-tube ?5/9- CLD via teaspoon only ?5/15- Diet advanced to soft ? ?Patient currently consuming 5% of meals. Was focused on drinking contrast today for CT. ?Will switch Boost Breeze to Ensure supplements for more kcals and protein. ? ?TPN continues at goal rate of 95 ml/hr (providing 2170 kcals and 109g protein). ? ?Admission weight: 118 lbs. ?Current weight: 134 lbs ? ?I/Os: +10.5L since 5/3 ? ?Medications: Vitamin C, Megace, Vitamin A ? ?Labs reviewed: ?CBGs: 123 ? ?Diet Order:   ?Diet Order   ? ?       ?  DIET SOFT Room service appropriate? Yes; Fluid consistency: Thin  Diet effective now       ?  ? ?  ?  ? ?  ? ? ?EDUCATION NEEDS:  ? ?Not appropriate for education at this time ? ?Skin:  Skin Assessment: Skin Integrity Issues: ?Skin Integrity Issues:: DTI, Incisions ?DTI: vertebral column ?Incisions: abdomen (4/15 and 4/21) ? ?Last BM:  5/15 -colostomy ? ?Height:  ? ?Ht Readings from Last 1 Encounters:  ?11/08/21 '5\' 10"'$  (1.778 m)  ? ? ?Weight:  ? ?Wt Readings from Last 1 Encounters:  ?11/21/21 60.8 kg  ? ? ?Ideal Body Weight:  69.3 kg ? ?BMI:  Body mass index is 19.23 kg/m?. ? ?Estimated Nutritional Needs:  ? ?Kcal:  2000-2200 kcal ? ?Protein:  105-125 grams ? ?Fluid:  >/= 2.2 L/day ? ?Clayton Bibles, MS, RD, LDN ?Inpatient Clinical  Dietitian ?Contact information available via Amion ? ?

## 2021-11-23 NOTE — Consult Note (Signed)
[facility] Nurse Consult Note ? ?[facility] Nurse wound follow up ?Wound type: [place] surgical wound with EC fistula. Bloody feculent effluent. ?Measurement:11.5cm x 5cm ?Wound bed: red, moist. Early stomatization of fistula ?Drainage (amount, consistency, odor) As noted above ?Periwound: intact, erythematous ?Dressing procedure/placement/frequency: Periwound protected with two skin barrier rings applied to periphery at skin-fistula junction. Medium Eakin pouch pattern cut off-center and applied over fistula, gentle warming hand-pressure applied to enhance seal. One-inch paper tape applied to pouch edge. ? ?Supplies ordered to bedside: 5 Medium Eakin fistula pouches, [facility] # [phone] ? ?[facility] Nurse ostomy follow up ?Stoma type/location: LUQ colostomy ?Stomal assessment/size: 1 and 5/8 inches round with mucocutaneous separation from 8-9 o'clock measuring 1cm x 1.2cm x 0.8cm.  ?Peristomal assessment: Full thickness skin injury measuring 0.8cm x 1cm with depth obscured by yellow slough. ?Treatment options for stomal/peristomal skin: The mucocutaneous separation is filled with stoma powder and topped with a skin barrier ring. The wound at the medial peristomal skin is to be covered with the skin barrier ?Output: thin brown effluent ?Ostomy pouching: 2pc., two and 3/4 inch pouching system with skin barrier ring. Pouch is cut off-center and tape is trimmed to accommodate LLQ drain and also Eakin pouch. ?Education provided: None today. Patient is tachycardic and is requiring an EKG at this time. She is with a low grade temp and is irritable. CT scan planned for when patient completes drinking contrast. ?Enrolled patient in [place] Start Discharge program: Yes ? ?Supplies ordered to bedside: 2 skin barriers, 2 and 3/4 inch, 2 pouches, 4 skin barrier rings. 1 roll 1-inch paper tape. ? ? ?[facility] nursing team will continue to follow, and will remain available to this patient, the nursing and medical teams as needed between visits.    ? ? ?Thanks, ?Maudie Flakes, MSN, RN, [place], [place], [facility], [place]  ?Pager# [phone]  ?

## 2021-11-23 NOTE — TOC Progression Note (Signed)
Transition of Care (TOC) - Progression Note  ? ? ?Patient Details  ?Name: JISEL FLEET ?MRN: [id number] ?Date of Birth: February 22, 1961 ? ?Transition of Care (TOC) CM/SW Contact  ?Leeroy Cha, RN ?Phone Number: ?11/23/2021, 8:33 AM ? ?Clinical Narrative:    ?No bed snf p[placement offers present. ? ? ?Expected Discharge Plan: Home/Self Care (TBD) ?Barriers to Discharge: Continued Medical Work up ? ?Expected Discharge Plan and Services ?Expected Discharge Plan: Home/Self Care (TBD) ?  ?Discharge Planning Services: CM Consult ?  ?Living arrangements for the past 2 months: [place] ?                ?  ?  ?  ?  ?  ?  ?  ?  ?  ?  ? ? ?Social Determinants of Health (SDOH) Interventions ?  ? ?Readmission Risk Interventions ? ?  10/24/2021  ? 10:10 AM  ?Readmission Risk Prevention Plan  ?Transportation Screening Complete  ?PCP or Specialist Appt within 3-5 Days Complete  ?[place] or Home Care Consult Complete  ?Social Work Consult for [place] Planning/Counseling Complete  ?Palliative Care Screening Complete  ?Medication Review Press photographer) Complete  ? ? ?

## 2021-11-23 NOTE — Progress Notes (Signed)
PT Cancellation Note ? ?Patient Details ?Name: Jessica [name] ?MRN: [id number] ?DOB: [birth year] ? ? ?Cancelled Treatment:     Pt in bed drinking contrast about to go down stairs for CT ABD.  Will attempt to see tomorrow as schedule permits as pt now plans to D/C to home vs SNF. ? ? ?Rica Koyanagi  PTA ?Acute  Rehabilitation Services ?Pager      [phone] ?Office      [phone] ? ? ? ?

## 2021-11-24 LAB — COMPREHENSIVE METABOLIC PANEL
ALT: 21 U/L (ref 0–44)
AST: 17 U/L (ref 15–41)
Albumin: 1.7 g/dL — ABNORMAL LOW (ref 3.5–5.0)
Alkaline Phosphatase: 81 U/L (ref 38–126)
Anion gap: 6 (ref 5–15)
BUN: 30 mg/dL — ABNORMAL HIGH (ref 6–20)
CO2: 22 mmol/L (ref 22–32)
Calcium: 7.5 mg/dL — ABNORMAL LOW (ref 8.9–10.3)
Chloride: 102 mmol/L (ref 98–111)
Creatinine, Ser: 0.86 mg/dL (ref 0.44–1.00)
GFR, Estimated: >60 mL/min (ref >60)
Glucose, Bld: 115 mg/dL — ABNORMAL HIGH (ref 70–99)
Potassium: 4.5 mmol/L (ref 3.5–5.1)
Sodium: 130 mmol/L — ABNORMAL LOW (ref 135–145)
Total Bilirubin: 0.6 mg/dL (ref 0.3–1.2)
Total Protein: 5.5 g/dL — ABNORMAL LOW (ref 6.5–8.1)

## 2021-11-24 LAB — CBC WITH DIFFERENTIAL/PLATELET
Abs Immature Granulocytes: 0.03 10*3/uL (ref 0.00–0.07)
Basophils Absolute: 0 10*3/uL (ref 0.0–0.1)
Basophils Relative: 0 %
Eosinophils Absolute: 0.4 10*3/uL (ref 0.0–0.5)
Eosinophils Relative: 6 %
HCT: 21.3 % — ABNORMAL LOW (ref 36.0–46.0)
Hemoglobin: 6.9 g/dL — CL (ref 12.0–15.0)
Immature Granulocytes: 0 %
Lymphocytes Relative: 30 %
Lymphs Abs: 2 10*3/uL (ref 0.7–4.0)
MCH: 29.4 pg (ref 26.0–34.0)
MCHC: 32.4 g/dL (ref 30.0–36.0)
MCV: 90.6 fL (ref 80.0–100.0)
Monocytes Absolute: 0.6 10*3/uL (ref 0.1–1.0)
Monocytes Relative: 8 %
Neutro Abs: 3.8 10*3/uL (ref 1.7–7.7)
Neutrophils Relative %: 56 %
Platelets: 183 10*3/uL (ref 150–400)
RBC: 2.35 MIL/uL — ABNORMAL LOW (ref 3.87–5.11)
RDW: 17 % — ABNORMAL HIGH (ref 11.5–15.5)
WBC: 6.8 10*3/uL (ref 4.0–10.5)
nRBC: 0 % (ref 0.0–0.2)

## 2021-11-24 LAB — GLUCOSE, CAPILLARY
Glucose-Capillary: 117 mg/dL — ABNORMAL HIGH (ref 70–99)
Glucose-Capillary: 119 mg/dL — ABNORMAL HIGH (ref 70–99)
Glucose-Capillary: 136 mg/dL — ABNORMAL HIGH (ref 70–99)
Glucose-Capillary: 140 mg/dL — ABNORMAL HIGH (ref 70–99)

## 2021-11-24 LAB — PREPARE RBC (CROSSMATCH)

## 2021-11-24 LAB — MAGNESIUM: Magnesium: 1.8 mg/dL (ref 1.7–2.4)

## 2021-11-24 LAB — TRIGLYCERIDES: Triglycerides: 69 mg/dL (ref <150)

## 2021-11-24 LAB — PHOSPHORUS: Phosphorus: 3.9 mg/dL (ref 2.5–4.6)

## 2021-11-24 MED ORDER — TRAVASOL 10 % IV SOLN
INTRAVENOUS | Status: AC
  Filled 2021-11-24: qty 1094.4

## 2021-11-24 MED ORDER — SODIUM CHLORIDE 0.9% IV SOLUTION: Freq: Once | INTRAVENOUS | Status: AC

## 2021-11-24 MED ORDER — METOPROLOL TARTRATE 50 MG PO TABS
50.0000 mg | ORAL_TABLET | Freq: Two times a day (BID) | ORAL | Status: DC
  Administered 2021-11-24 – 2021-11-28 (×8): 50 mg via ORAL
  Filled 2021-11-24 (×8): qty 1

## 2021-11-24 NOTE — Progress Notes (Signed)
OT Cancellation Note  Patient Details Name: Jessica [name] MRN: [id number] DOB: [birth year]   Cancelled Treatment:    Reason Eval/Treat Not Completed: Medical issues which prohibited therapy;Other (comment): Attempted pt at 11:50 but pt about to begin PRBCs and RN requested to see later in PM. Spoke with pt who adamantly stated that if she is sleeping on OT's return to absolutely wake her up. Returned at 1435 and pt sleeping and left alone. Pt's RN notified.   Julien Girt 11/24/2021, 2:44 PM

## 2021-11-24 NOTE — Progress Notes (Signed)
PROGRESS NOTE    Jessica [name]  JSE:[id number] DOB: Jan 26, 1961 DOA: 10/22/2021 PCP: Deland Pretty, MD   Brief Narrative:  61 year old female with history of alcohol abuse, several alcohol withdrawals requiring admissions, tobacco use presented on 10/22/2021 with diffuse abdominal pain with CT of abdomen and pelvis showed pneumoperitoneum and free fluid.  She was started on broad-spectrum antibiotics and emergently taken to the OR and underwent exploratory laparotomy where she was found to have a perforated sigmoid colon.  She is status post Hartmann procedure and insertion of G-tube.  She was found to have Klebsiella bacteremia.  Hospital course complicated by development of left lower quadrant fluid collection status post IR drainage on 10/29/2021, drain cultures grew Pseudomonas.  This required having her drain adjusted and upsized on 510, fluid appeared feculent.  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  She had a repeat CT on 11/10/2021 with pelvic fluid collection measuring 6 x 3.2 cm.  This was discussed with IR and it was not felt safe or amenable to drainage.  She was continued on IV antibiotics.  ID was consulted.  Repeat CT scan showed stable pelvic abscess also remaining unable to be drained.  She is currently on TPN.  Palliative care was also consulted for goals of care discussion.  Currently remains full code.  Assessment & Plan:   Complicated intra-abdominal infection Perforated sigmoid colon status post expiratory laparotomy/Hartman's procedure/G-tube placement Septic shock: Present on admission, resolved Klebsiella bacteremia [facility] course as above. -As per recent ID recommendations, patient has been on IV Zosyn: Undetermined prolonged period given uncontrolled intra-abdominal infection with abscess not amenable to drainage. -General surgery following.  Continue TPN. Low-grade temperatures recently.  Follow repeat blood  cultures from 11/23/2021.  Follow general surgery recommendations.  Wound care as per general surgery and wound care nurse recommendations.  Wound care nurse following for colostomy and pouching of fistula.  General surgery was repeated on CT of the abdomen on 11/13/2021 which showed decreasing collections but findings worrisome for small bowel ileus versus partial obstruction. -Septic shock has already resolved.  Off pressors  Acute respiratory failure with hypoxia/aspiration pneumonia -Has required intubation and extubation twice.  Extubated again on 11/08/2021.  Currently on room air.  Acute metabolic encephalopathy -Multifactorial due to ICU delirium/infectious etiology/alcohol withdrawal -Mental status has mostly improved.  Monitor mental status. -Fall precautions  AKI -Resolved.  Essential hypertension -Monitor blood pressure.  Pressure on the higher side.  Increase metoprolol to 50 mg twice a day.  Continue hydralazine.  Anemia of chronic disease -Has required 3 units packed red cells transfusion during the hospitalization -Hemoglobin 6.9 today.  Transfuse 1 unit packed red cells.  Monitor H&H.  Hyponatremia -Sodium ranging from 129-132.  Currently on TPN.  Monitor  Severe protein calorie malnutrition hypoalbuminemia Anorexia -Due to poor oral intake.  Continue TPN.  Nutrition following. -Currently on Megace as well  Hypomagnesemia -Resolved  Substance-induced mood disorder/anxiety -Continue lorazepam as needed.  Physical deconditioning -Will need SNF placement  DVT prophylaxis: Heparin subcutaneous Code Status: Full Family Communication: None at bedside Disposition Plan: Status is: Inpatient Remains inpatient appropriate because: Of severity of illness    Consultants: General surgery/palliative care/ID/PCCM/IR  Procedures: As above  Antimicrobials:  Anti-infectives (From admission, onward)    Start     Dose/Rate Route Frequency Ordered Stop   11/14/21 0800   piperacillin-tazobactam (ZOSYN) IVPB 3.375 g        3.375 g 12.5 mL/hr  over 240 Minutes Intravenous Every 8 hours 11/14/21 0739     11/04/21 1000  piperacillin-tazobactam (ZOSYN) IVPB 3.375 g        3.375 g 12.5 mL/hr over 240 Minutes Intravenous Every 8 hours 11/04/21 0935 11/11/21 1312   10/27/21 1600  cefTRIAXone (ROCEPHIN) 2 g in sodium chloride 0.9 % 100 mL IVPB  Status:  Discontinued        2 g 200 mL/hr over 30 Minutes Intravenous Every 24 hours 10/27/21 0843 10/27/21 1106   10/27/21 1600  piperacillin-tazobactam (ZOSYN) IVPB 3.375 g        3.375 g 12.5 mL/hr over 240 Minutes Intravenous Every 8 hours 10/27/21 1106 11/03/21 2000   10/27/21 1400  metroNIDAZOLE (FLAGYL) IVPB 500 mg  Status:  Discontinued        500 mg 100 mL/hr over 60 Minutes Intravenous Every 12 hours 10/27/21 0843 10/27/21 1106   10/24/21 1600  piperacillin-tazobactam (ZOSYN) IVPB 3.375 g  Status:  Discontinued        3.375 g 12.5 mL/hr over 240 Minutes Intravenous Every 8 hours 10/24/21 1422 10/27/21 0843   10/24/21 1000  cefTRIAXone (ROCEPHIN) 2 g in sodium chloride 0.9 % 100 mL IVPB  Status:  Discontinued        2 g 200 mL/hr over 30 Minutes Intravenous Every 24 hours 10/24/21 0747 10/24/21 1357   10/23/21 1530  vancomycin (VANCOCIN) IVPB 1000 mg/200 mL premix        1,000 mg 200 mL/hr over 60 Minutes Intravenous  Once 10/23/21 1439 10/23/21 1547   10/23/21 0200  ceFEPIme (MAXIPIME) 2 g in sodium chloride 0.9 % 100 mL IVPB  Status:  Discontinued        2 g 200 mL/hr over 30 Minutes Intravenous Every 12 hours 10/22/21 2032 10/24/21 0747   10/22/21 2200  cefOXitin (MEFOXIN) 2 g in sodium chloride 0.9 % 100 mL IVPB  Status:  Discontinued        2 g 200 mL/hr over 30 Minutes Intravenous Every 8 hours 10/22/21 1706 10/22/21 2032   10/22/21 1956  vancomycin variable dose per unstable renal function (pharmacist dosing)  Status:  Discontinued         Does not apply See admin instructions 10/22/21 1957 10/23/21 1917    10/22/21 1245  vancomycin (VANCOCIN) IVPB 1000 mg/200 mL premix        1,000 mg 200 mL/hr over 60 Minutes Intravenous  Once 10/22/21 1241 10/22/21 1402   10/22/21 1245  ceFEPIme (MAXIPIME) 2 g in sodium chloride 0.9 % 100 mL IVPB        2 g 200 mL/hr over 30 Minutes Intravenous  Once 10/22/21 1241 10/22/21 1321   10/22/21 1230  metroNIDAZOLE (FLAGYL) IVPB 500 mg  Status:  Discontinued        500 mg 100 mL/hr over 60 Minutes Intravenous Every 12 hours 10/22/21 1222 10/24/21 1357        Subjective: Patient seen and examined at bedside.  Poor historian.  No overnight fever, vomiting, shortness of breath or chest pain reported.  Feels slightly better today. Objective: Vitals:   11/23/21 1116 11/23/21 1500 11/23/21 1700 11/23/21 2153  BP: (!) 141/63 138/67 (!) 153/76 137/70  Pulse:  (!) 112  (!) 116  Resp: '20 20 20   '$ Temp: 99.6 F (37.6 C) 99 F (37.2 C)  98.2 F (36.8 C)  TempSrc: Oral Oral  Oral  SpO2: 97% 98% 97% 96%  Weight:      Height:  Intake/Output Summary (Last 24 hours) at 11/24/2021 0752 Last data filed at 11/24/2021 0600 Gross per 24 hour  Intake 1185 ml  Output 1591 ml  Net -406 ml    Filed Weights   11/18/21 0500 11/19/21 0330 11/21/21 0500  Weight: 55.3 kg 59.9 kg 60.8 kg    Examination:  General: On room air.  No distress.  Chronically ill and deconditioned looking ENT/neck: No thyromegaly.  JVD is not elevated  respiratory: Decreased breath sounds at bases bilaterally with some crackles; no wheezing  CVS: S1-S2 heard, tachycardic intermittently Abdominal: Soft, mild tender diffusely, slightly distended; no organomegaly, normal bowel sounds are heard. Ostomy bag and Eakin's pouch present Extremities: Trace lower extremity edema; no cyanosis  CNS: Awake and alert.  Slow to respond.  Extremely poor historian.  No focal neurologic deficit.  Moves extremities Lymph: No obvious lymphadenopathy Skin: No obvious ecchymosis/rashes psych: Affect is  flat.  Does not participate in conversation much musculoskeletal: No obvious joint swelling/deformity     Data Reviewed: I have personally reviewed following labs and imaging studies  CBC: Recent Labs  Lab 11/20/21 0426 11/21/21 0321 11/22/21 0536 11/23/21 0329 11/24/21 0323  WBC 7.2 7.6 7.1 6.8 6.8  NEUTROABS 4.2 4.0 4.2 3.5 3.8  HGB 8.1* 8.2* 7.6* 7.3* 6.9*  HCT 24.4* 24.3* 23.2* 22.5* 21.3*  MCV 88.4 88.0 89.2 90.7 90.6  PLT 229 233 196 188 967    Basic Metabolic Panel: Recent Labs  Lab 11/20/21 0426 11/21/21 0321 11/22/21 0536 11/23/21 0329 11/24/21 0323  NA 132* 129* 131* 129* 130*  K 4.2 3.8 3.5 4.0 4.5  CL 103 99 99 101 102  CO2 21* '23 26 22 22  '$ GLUCOSE 114* 116* 128* 110* 115*  BUN 35* 32* 33* 33* 30*  CREATININE 0.84 0.67 0.75 0.83 0.86  CALCIUM 8.1* 7.6* 7.6* 7.4* 7.5*  MG 1.9 1.6* 2.1 2.0 1.8  PHOS 3.1 3.2 3.4 3.5 3.9    GFR: Estimated Creatinine Clearance: 66.8 mL/min (by C-G formula based on SCr of 0.86 mg/dL). Liver Function Tests: Recent Labs  Lab 11/20/21 0426 11/21/21 0321 11/22/21 0536 11/23/21 0329 11/24/21 0323  AST '19 19 18 16 17  '$ ALT '22 21 21 20 21  '$ ALKPHOS 101 97 90 79 81  BILITOT 0.4 0.6 0.6 0.5 0.6  PROT 5.8* 5.7* 5.6* 5.7* 5.5*  ALBUMIN 1.9* 1.9* 1.8* 1.8* 1.7*    No results for input(s): LIPASE, AMYLASE in the last 168 hours. No results for input(s): AMMONIA in the last 168 hours. Coagulation Profile: No results for input(s): INR, PROTIME in the last 168 hours. Cardiac Enzymes: No results for input(s): CKTOTAL, CKMB, CKMBINDEX, TROPONINI in the last 168 hours. BNP (last 3 results) No results for input(s): PROBNP in the last 8760 hours. HbA1C: No results for input(s): HGBA1C in the last 72 hours. CBG: Recent Labs  Lab 11/23/21 0632 11/23/21 1205 11/23/21 1857 11/24/21 0009 11/24/21 0551  GLUCAP 122* 123* 121* 140* 119*    Lipid Profile: Recent Labs    11/24/21 0323  TRIG 69    Thyroid Function  Tests: No results for input(s): TSH, T4TOTAL, FREET4, T3FREE, THYROIDAB in the last 72 hours. Anemia Panel: No results for input(s): VITAMINB12, FOLATE, FERRITIN, TIBC, IRON, RETICCTPCT in the last 72 hours. Sepsis Labs: No results for input(s): PROCALCITON, LATICACIDVEN in the last 168 hours.  Recent Results (from the past 240 hour(s))  Culture, blood (Routine X 2) w Reflex to ID Panel     Status: None  Collection Time: 11/14/21 11:30 AM   Specimen: BLOOD  Result Value Ref Range Status   Specimen Description   Final    BLOOD BLOOD LEFT HAND Performed at [facility] [address] [place], [place] [address]    Special Requests   Final    IN PEDIATRIC BOTTLE Blood Culture adequate volume Performed at [facility] [address], [place], [place] [address]    Culture   Final    NO GROWTH 5 DAYS Performed at [facility], [place] [address], [place], [place] [address]    Report Status 11/19/2021 FINAL  Final  Culture, blood (Routine X 2) w Reflex to ID Panel     Status: None   Collection Time: 11/14/21 11:30 AM   Specimen: BLOOD  Result Value Ref Range Status   Specimen Description   Final    BLOOD BLOOD LEFT HAND Performed at [facility] [address], [place], [place] [address]    Special Requests   Final    IN PEDIATRIC BOTTLE Blood Culture adequate volume Performed at [facility] [address], [place], [place] [address]    Culture   Final    NO GROWTH 5 DAYS Performed at [facility], [place] [address], [place], [place] [address]    Report Status 11/19/2021 FINAL  Final  Culture, blood (Routine X 2) w Reflex to ID Panel     Status: None (Preliminary result)   Collection Time: 11/23/21 10:51 AM   Specimen: BLOOD  Result Value Ref Range Status   Specimen Description   Final    BLOOD RIGHT ANTECUBITAL Performed at [facility] [address] [place],  [place] [address]    Special Requests   Final    IN PEDIATRIC BOTTLE Blood Culture adequate volume Performed at [facility] [address] [place], [place] [address]    Culture   Final    NO GROWTH < 24 HOURS Performed at [facility] [address] [place], [place] [address]    Report Status PENDING  Incomplete  Culture, blood (Routine X 2) w Reflex to ID Panel     Status: None (Preliminary result)   Collection Time: 11/23/21 11:48 AM   Specimen: BLOOD  Result Value Ref Range Status   Specimen Description   Final    BLOOD BLOOD RIGHT FOREARM Performed at [facility] [address], [place], [place] [address]    Special Requests   Final    BOTTLES DRAWN AEROBIC ONLY Blood Culture adequate volume Performed at [facility] [address] [place], [place] [address]    Culture   Final    NO GROWTH < 24 HOURS Performed at [facility] [address], [place], [place] [address]    Report Status PENDING  Incomplete          Radiology Studies: CT ABDOMEN PELVIS W CONTRAST  Result Date: 11/23/2021 CLINICAL DATA:  Intra-abdominal abscess, tachycardia and fever. Known leak. EXAM: CT ABDOMEN AND PELVIS WITH CONTRAST TECHNIQUE: Multidetector CT imaging of the abdomen and pelvis was performed using the standard protocol following bolus administration of intravenous contrast. RADIATION DOSE REDUCTION: This exam was performed according to the departmental dose-optimization program which includes automated exposure control, adjustment of the mA and/or kV according to patient size and/or use of iterative reconstruction technique. CONTRAST:  11m OMNIPAQUE IOHEXOL 300 MG/ML  SOLN COMPARISON:  CT abdomen and pelvis 11/17/2021 FINDINGS: Lower chest: Small right and moderate left pleural effusions are present. These have increased from prior. There is left lower lobe atelectasis. Hepatobiliary: Gallstones are again seen. There is  mild gallbladder wall edema, unchanged. There is no biliary ductal dilatation. No focal liver lesion identified. Pancreas: Unremarkable. No pancreatic ductal dilatation or surrounding inflammatory changes. Spleen: Normal in size without focal abnormality. Adrenals/Urinary Tract: Adrenal glands are unremarkable. Kidneys are normal, without renal calculi, focal lesion, or hydronephrosis. Bladder is unremarkable. 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 Colon appears nondilated. Percutaneous gastrostomy tube is in the body of the stomach. The stomach is nondilated. There are numerous small bowel loops are mildly dilated with air-fluid levels. These contain contrast. No definitive transition point visualized, but contrast is seen to the level of the distal small bowel. No significant contrast seen throughout the colon. No free air or pneumatosis identified. Appendix is not seen. Vascular/Lymphatic: Aortic atherosclerosis. No enlarged abdominal or pelvic lymph nodes. Reproductive: Prior hysterectomy. Other: Left pelvic percutaneous drainage catheter remains unchanged in position. Previously identified fluid collection in this area has resolved. Enhancing fluid collection in the posterior pelvis has decreased in size now measuring 2.8 x 5.2 by 3.8 cm (previously 6.4 by 3.2 by 4.6 cm). Enhancing fluid collection in the presacral space has not significantly changed measuring 3.5 x 1.7 x 1.4 cm image 2/58. Enhancing fluid collection along the left pelvic sidewall has not significantly changed measuring 1.6 x 3.1 by 2.3 cm. No new fluid collections are identified. No ascites. Body wall edema has increased. Midline abdominal wound is again noted. Musculoskeletal: L2 compression deformity is stable. There stable severe degenerative changes at L3-L4. IMPRESSION: 1. Left-sided pigtail catheter in place. Previously identified fluid collection in this region has resolved. 2. Central pelvic enhancing fluid  collection has decreased in size. Other pelvic fluid collections are stable. 3. Diffusely dilated small bowel containing contrast worrisome for small bowel ileus or partial distal small bowel obstruction. 4. Increasing body wall edema. 5. Again seen is a left lower quadrant colostomy. 6. Increasing bilateral pleural effusions. 7. Cholelithiasis with stable gallbladder wall edema. Correlate clinically for cholecystitis. 8.  Aortic Atherosclerosis (ICD10-I70.0). Electronically Signed   By: Ronney Asters M.D.   On: 11/23/2021 16:25   DG CHEST PORT 1 VIEW  Result Date: 11/23/2021 CLINICAL DATA:  Dyspnea. EXAM: PORTABLE CHEST 1 VIEW COMPARISON:  Nov 07, 2021. FINDINGS: The heart size and mediastinal contours are within normal limits. Both lungs are clear. Left-sided PICC line is noted with distal tip in expected position of right atrium. Endotracheal tube has been removed. Moderately displaced proximal right humeral fracture is again noted. IMPRESSION: No acute cardiopulmonary abnormality seen. Electronically Signed   By: Marijo Conception M.D.   On: 11/23/2021 12:47        Scheduled Meds:  sodium chloride   Intravenous Once   sodium chloride   Intravenous Once   vitamin C  500 mg Per Tube BID   chlorhexidine  15 mL Mouth Rinse BID   Chlorhexidine Gluconate Cloth  6 each Topical Daily   feeding supplement  237 mL Oral BID BM   heparin injection (subcutaneous)  5,000 Units Subcutaneous Q8H   mouth rinse  15 mL Mouth Rinse q12n4p   megestrol  400 mg Oral BID   metoprolol tartrate  25 mg Oral BID   nicotine  14 mg Transdermal Daily   pantoprazole (PROTONIX) IV  40 mg Intravenous Q24H  sodium chloride flush  10-40 mL Intracatheter Q12H   sodium chloride flush  5 mL Intracatheter Q12H   sodium chloride flush  5 mL Intracatheter Q8H   vitamin A  10,000 Units Per Tube Daily   Continuous Infusions:  sodium chloride 250 mL (11/22/21 0019)   piperacillin-tazobactam (ZOSYN)  IV 3.375 g (11/24/21 0018)    TPN ADULT (ION) 95 mL/hr at 11/23/21 1743          Clydell Sposito Starla Link, MD [facility] 11/24/2021, 7:52 AM

## 2021-11-24 NOTE — Progress Notes (Signed)
SLP Cancellation Note  Patient Details Name: Jessica [name] MRN: [id number] DOB: June 27, 1961   Cancelled treatment:       Reason Eval/Treat Not Completed: SLP screened. Visited pt after she had finished breakfast. She reports tolerating solid texture of food well. Says she is getting some things on her Soft (digestible) diet that she thinks may be inappropriate such as corn and broccoli. We discussed avoiding high fiber foods. Pt able to identify these independently. No SLP f/u needed will sign off.    Valaria Kohut, Katherene Ponto 11/24/2021, 9:45 AM

## 2021-11-24 NOTE — Progress Notes (Signed)
[facility] for [facility]ious Disease  Date of Admission:  10/22/2021           Reason for visit: Follow up on intra abdominal infection  Current antibiotics: Zosyn 4/17-5/5; 5/8 - present   Prior antibiotics: Ceftriaxone 4/17 Cefepime 4/15 - 4/17 Metronidazole 4/15 - 4/17 Vancomycin 4/15 - 4/16    ASSESSMENT:    61 y.o. female admitted with:  Complicated intra-abdominal infection: Patient presented with septic shock due to Klebsiella bacteremia from a perforated sigmoid colon on 4/15.  Status post Hartman/colostomy procedure with G-tube placement at that time.  This was complicated by colocutaneous fistula formation and intra-abdominal abscess.  Status post IR drain placement into this fluid collection 4/22 with cultures growing Pseudomonas.  Another CT scan obtained 5/4 notable for a second pelvic fluid collection measuring 6 x 3.2 cm unable to be drained by IR.  On 5/10, she underwent drain upsize into the peri-stomal collection yielding feculent debris.  This residual abscess cavity was noted to have a persistent fistulous connection with her open midline abdominal wound.  She had repeat CT 5/17 showing resolution of fluid collection with catheter in place.  Her undrained pelvic collection was decreased in size now measuing 2.8x5.2x3.8cm.  She has other enhancing fluid collections in the presacral space and pelvic sidewall that were not signficantly changed in size.    RECOMMENDATIONS:    Continue Zosyn.  Her previous fluid collection that had drainage catheter with fistulous connection to her open midline wound has resolved but she has 3 remaining enhancing fluid collections not drained Wound care Nutrition.  Hopefully TPN can be weaned off soon Lab monitoring Will follow   Principal Problem:   Severe sepsis due to perforated sigmoid colon s/p Hartmann/colostomy 10/22/2021 Active Problems:   Substance induced mood disorder (HCC)   Anxiety   Chronic pain   Primary  insomnia   Tobacco use disorder   Underweight   Protein-calorie malnutrition, severe (HCC)   Colostomy in place ([place])   AKI (acute kidney injury) ([place])   Stricture and stenosis of esophagus   Gastrostomy tube in place [facility])   Acute respiratory failure with hypoxia ([place])   Acute metabolic encephalopathy   Hypernatremia, hyponatremia, hypokalemia, hyperphosphatemia   Hyponatremia   Elevated brain natriuretic peptide (BNP) level   Right proximal humeral fracture   Oropharyngeal dysphagia   Normocytic anemia   Palliative care by specialist   Goals of care, counseling/discussion   General weakness   Septic shock ([place])   Postoperative intra-abdominal abscess   Colocutaneous fistula   Generalized abdominal pain   Aspiration pneumonia ([place])   Bacteremia due to Klebsiella pneumoniae   Acute encephalopathy   Humerus fracture    MEDICATIONS:    Scheduled Meds:  sodium chloride   Intravenous Once   sodium chloride   Intravenous Once   vitamin C  500 mg Per Tube BID   chlorhexidine  15 mL Mouth Rinse BID   Chlorhexidine Gluconate Cloth  6 each Topical Daily   feeding supplement  237 mL Oral BID BM   heparin injection (subcutaneous)  5,000 Units Subcutaneous Q8H   mouth rinse  15 mL Mouth Rinse q12n4p   megestrol  400 mg Oral BID   metoprolol tartrate  25 mg Oral BID   nicotine  14 mg Transdermal Daily   pantoprazole (PROTONIX) IV  40 mg Intravenous Q24H   sodium chloride flush  10-40 mL Intracatheter Q12H   sodium chloride flush  5 mL  Intracatheter Q12H   sodium chloride flush  5 mL Intracatheter Q8H   vitamin A  10,000 Units Per Tube Daily   Continuous Infusions:  sodium chloride 250 mL (11/22/21 0019)   piperacillin-tazobactam (ZOSYN)  IV 3.375 g (11/24/21 0018)   TPN ADULT (ION) 95 mL/hr at 11/23/21 1743   PRN Meds:.acetaminophen, antiseptic oral rinse, diphenhydrAMINE, fentaNYL (SUBLIMAZE) injection, hydrALAZINE, ipratropium-albuterol, lidocaine, lip balm, LORazepam,  metoprolol tartrate, ondansetron (ZOFRAN) IV, sodium chloride, sodium chloride flush  SUBJECTIVE:   24 hour events:  CT obtained yesterday due to fever Resolution of abscess with left sided pigtail catheter Central pelvic fluid collection improved Remains on TPN via left UE PICC placed 4/28 Tmax 99.6 Pt refusing CHG baths Repeat blood cx pending  Complains of bilateral shoulder discomfort this morning.  Otherwise no fevers or chills.  Tolerating p.o. intake.  Still on TPN.  Review of Systems  All other systems reviewed and are negative.    OBJECTIVE:   Blood pressure 137/70, pulse (!) 116, temperature 98.2 F (36.8 C), temperature source Oral, resp. rate 20, height '5\' 10"'$  (1.778 m), weight 60.8 kg, SpO2 96 %. Body mass index is 19.23 kg/m.  Physical Exam Constitutional:      General: She is not in acute distress.    Appearance: Normal appearance.  HENT:     Head: Normocephalic and atraumatic.  Eyes:     Extraocular Movements: Extraocular movements intact.     Conjunctiva/sclera: Conjunctivae normal.  Pulmonary:     Effort: Pulmonary effort is normal. No respiratory distress.  Abdominal:     General: There is no distension.     Palpations: Abdomen is soft.     Comments: Midline abdominal wound, colostomy, and IR drain in place.   Musculoskeletal:        General: Normal range of motion.     Cervical back: Normal range of motion and neck supple.  Skin:    General: Skin is warm and dry.     Findings: No rash.  Neurological:     General: No focal deficit present.     Mental Status: She is alert and oriented to person, place, and time.  Psychiatric:        Mood and Affect: Mood normal.        Behavior: Behavior normal.     Lab Results: Lab Results  Component Value Date   WBC 6.8 11/24/2021   HGB 6.9 (LL) 11/24/2021   HCT 21.3 (L) 11/24/2021   MCV 90.6 11/24/2021   PLT 183 11/24/2021    Lab Results  Component Value Date   NA 130 (L) 11/24/2021   K 4.5  11/24/2021   CO2 22 11/24/2021   GLUCOSE 115 (H) 11/24/2021   BUN 30 (H) 11/24/2021   CREATININE 0.86 11/24/2021   CALCIUM 7.5 (L) 11/24/2021   GFRNONAA >60 11/24/2021   GFRAA >60 12/12/2017    Lab Results  Component Value Date   ALT 21 11/24/2021   AST 17 11/24/2021   ALKPHOS 81 11/24/2021   BILITOT 0.6 11/24/2021    No results found for: CRP  No results found for: ESRSEDRATE   I have reviewed the micro and lab results in Epic.  Imaging: CT ABDOMEN PELVIS W CONTRAST  Result Date: 11/23/2021 CLINICAL DATA:  Intra-abdominal abscess, tachycardia and fever. Known leak. EXAM: CT ABDOMEN AND PELVIS WITH CONTRAST TECHNIQUE: Multidetector CT imaging of the abdomen and pelvis was performed using the standard protocol following bolus administration of intravenous contrast. RADIATION DOSE REDUCTION:  This exam was performed according to the departmental dose-optimization program which includes automated exposure control, adjustment of the mA and/or kV according to patient size and/or use of iterative reconstruction technique. CONTRAST:  141m OMNIPAQUE IOHEXOL 300 MG/ML  SOLN COMPARISON:  CT abdomen and pelvis 11/17/2021 FINDINGS: Lower chest: Small right and moderate left pleural effusions are present. These have increased from prior. There is left lower lobe atelectasis. Hepatobiliary: Gallstones are again seen. There is mild gallbladder wall edema, unchanged. There is no biliary ductal dilatation. No focal liver lesion identified. Pancreas: Unremarkable. No pancreatic ductal dilatation or surrounding inflammatory changes. Spleen: Normal in size without focal abnormality. Adrenals/Urinary Tract: Adrenal glands are unremarkable. Kidneys are normal, without renal calculi, focal lesion, or hydronephrosis. Bladder is unremarkable. 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 Colon appears nondilated. Percutaneous gastrostomy tube is in the body of the stomach. The stomach is nondilated.  There are numerous small bowel loops are mildly dilated with air-fluid levels. These contain contrast. No definitive transition point visualized, but contrast is seen to the level of the distal small bowel. No significant contrast seen throughout the colon. No free air or pneumatosis identified. Appendix is not seen. Vascular/Lymphatic: Aortic atherosclerosis. No enlarged abdominal or pelvic lymph nodes. Reproductive: Prior hysterectomy. Other: Left pelvic percutaneous drainage catheter remains unchanged in position. Previously identified fluid collection in this area has resolved. Enhancing fluid collection in the posterior pelvis has decreased in size now measuring 2.8 x 5.2 by 3.8 cm (previously 6.4 by 3.2 by 4.6 cm). Enhancing fluid collection in the presacral space has not significantly changed measuring 3.5 x 1.7 x 1.4 cm image 2/58. Enhancing fluid collection along the left pelvic sidewall has not significantly changed measuring 1.6 x 3.1 by 2.3 cm. No new fluid collections are identified. No ascites. Body wall edema has increased. Midline abdominal wound is again noted. Musculoskeletal: L2 compression deformity is stable. There stable severe degenerative changes at L3-L4. IMPRESSION: 1. Left-sided pigtail catheter in place. Previously identified fluid collection in this region has resolved. 2. Central pelvic enhancing fluid collection has decreased in size. Other pelvic fluid collections are stable. 3. Diffusely dilated small bowel containing contrast worrisome for small bowel ileus or partial distal small bowel obstruction. 4. Increasing body wall edema. 5. Again seen is a left lower quadrant colostomy. 6. Increasing bilateral pleural effusions. 7. Cholelithiasis with stable gallbladder wall edema. Correlate clinically for cholecystitis. 8.  Aortic Atherosclerosis (ICD10-I70.0). Electronically Signed   By: ARonney AstersM.D.   On: 11/23/2021 16:25   DG CHEST PORT 1 VIEW  Result Date: 11/23/2021 CLINICAL  DATA:  Dyspnea. EXAM: PORTABLE CHEST 1 VIEW COMPARISON:  Nov 07, 2021. FINDINGS: The heart size and mediastinal contours are within normal limits. Both lungs are clear. Left-sided PICC line is noted with distal tip in expected position of right atrium. Endotracheal tube has been removed. Moderately displaced proximal right humeral fracture is again noted. IMPRESSION: No acute cardiopulmonary abnormality seen. Electronically Signed   By: JMarijo ConceptionM.D.   On: 11/23/2021 12:47     Imaging independently reviewed in Epic.    ARaynelle Highlandfor Infectious Disease [facility] View[facility] [phone] 11/24/2021, 8:16 AM  I have personally spent 50 minutes involved in face-to-face and non-face-to-face activities for this patient on the day of the visit. Professional time spent includes the following activities: Preparing to see the patient (review of tests), Obtaining and/or reviewing separately obtained history (admission/discharge record), Performing a medically appropriate  examination and/or evaluation , Ordering medications/tests/procedures, referring and communicating with other health care professionals, Documenting clinical information in the EMR, Independently interpreting results (not separately reported), Communicating results to the patient/family/caregiver, Counseling and educating the patient/family/caregiver and Care coordination (not separately reported).

## 2021-11-24 NOTE — Progress Notes (Addendum)
Multiple attempts made by RN and nurse tech to bathe patient with CHG and perform other daily hygiene care. Patient refused multiple times stating "I am too comfortable right now, I will do it later." Patient educated on importance of CHG bath for infection prevention being that she has central line access and also potential skin break down d/t urinary incontinence. Patient verbalized understanding and still declined.

## 2021-11-24 NOTE — Progress Notes (Signed)
PHARMACY - TOTAL PARENTERAL NUTRITION CONSULT NOTE   Indication: Prolonged ileus  Patient Measurements: Height: '5\' 10"'  (177.8 cm) Weight: 60.8 kg (134 lb 0.6 oz) IBW/kg (Calculated) : 68.5 TPN AdjBW (KG): 53.8 Body mass index is 19.23 kg/m. Usual Weight: 53.8 kg  Assessment: 61 yo F w/ PMH etoh use disorder, smoker, HTN, GERD. S/p Exp Lap Hartmans 10/22/2021 for perforated sigmoid colon, Dr. Marcello Moores.    Glucose / Insulin: no hx DM.  CBGs continue to be <150.  (insulin d/c 5/1) Electrolytes: Na low 130 (max conc of Na in TPN); phos wnl but trending up (Phos content in TPN decreased on 5/17), Mag 1.8, K is wnl but trending up; CorrCa, K wnl  - goal for ileus: Mag > 2, K > 4, phos ~3 - CL and CO2 wnl Vitamins:   - Vitamin C <0.1 (4/22); receiving 200 mg ascorbic acid daily in TPN from [facility] - Vitamin A 14.2 (4/22); receiving 1 mg of vitamin A daily in TPN from [facility] - Unable to add additional ascorbic acid and vitamin A to TPN per TPN policy - 5/5 - initiated ascorbic acid 500 mg per tube BID and vitamin A 10,000 units per tube daily. Tube to be clamped x1 hour after administration then returned to suction.  Renal: SCr WNL; BUN 33 Hepatic: LFTs, Tbili, Alk Phos WNL; TG stable WNL.  - Albumin remains low - prealbumin 18.2 on 5/13  Intake / Output:  - I/O: -406 mL -UOP: 1 ml/kg/hr.  - Drain output: 90 mL.  - Colostomy: 50 mL  - No IVF GI Imaging:  - 4/15 CTA: bowel perforation - 4/17 KUB ileus or pSBO - 4/20 CTA: severe ileus or pSBO - 5/04 CT: new pelvic collection not amenable to drainage, likely an abscess - 5/11 CTa/p: fluid collections unchanged from 5/4 - 5/18 abd CT: Central pelvic enhancing fluid collection has decreased in size. Diffusely dilated small bowel containing contrast worrisome for small bowel ileus or partial distal small bowel obstruction. Increasing bilateral pleural effusions. GI Surgeries / Procedures:  - 10/22/21 s/p EXPLORATORY LAPAROTOMY hartmans for  perforated sigmoid colon - 4/23 IR placed drain LLQ - 5/10 Drainage cath exchanged  Central access: CVC TL placed 10/23/21 TPN start date: 10/26/21  Nutritional Goals: Goal TPN rate is 95 mL/hr (provides 109 g of protein and 2170 kcals per day)   RD Assessment: (5/10) Estimated Needs Total Energy Estimated Needs: 2000-2200 kcal Total Protein Estimated Needs: 105-125 grams Total Fluid Estimated Needs: >/= 2.2 L/day  Current Nutrition:  - TPN - 5/10 Boost 1 container bid (pt refused most doses >> 5/17 - 5/18 Ensure 1 can bid - Diet advanced from NPO to CLD on 5/12 --> adv to soft diet on 5/15   - on megace - 5/17: RN noted that appetite is poor on 5/16 - 5/18: 5% - 75% meals documented as consumed  Plan:   At 18:00:   Continue TPN @  95 mL/hr to provide 100% of goals Electrolytes in TPN:  Continue Na 150 mEq/L Reduce K to 40 mEq/L Ca 3 mEq/L Increase Mg to 10 mEq/L Reduce Phos to 2 mmol/L Continue Cl:Ac to 1:2 Continue standard [facility] and trace elements to TPN Continue folic acid & thiamine in TPN Monitor TPN labs on Mon/Thurs CMET daily thru 5/21 and  phos/mag ordered as daily by MD Follow up enteral intake for appropriate wean off TPN    Lynelle Doctor, PharmD, BCPS Clinical Pharmacist 11/24/2021 7:11 AM

## 2021-11-24 NOTE — TOC Progression Note (Signed)
Transition of Care [facility]) - Progression Note    Patient Details  Name: Jessica [name] MRN: [id number] Date of Birth: [birth year]  [facility] CM/SW Contact  Leeroy Cha, RN Phone Number: 11/24/2021, 10:39 AM  Clinical Narrative:    Following for toc needs.  Has no bed offers for snf.   Expected Discharge Plan: Home/Self Care (TBD) Barriers to Discharge: Continued Medical Work up  Expected Discharge Plan and Services Expected Discharge Plan: Home/Self Care (TBD)   Discharge Planning Services: CM Consult   Living arrangements for the past 2 months: Single Family Home                                       Social Determinants of Health (SDOH) Interventions    Readmission Risk Interventions    10/24/2021   10:10 AM  Readmission Risk Prevention Plan  Transportation Screening Complete  PCP or Specialist Appt within 3-5 Days Complete  HRI or [place] Complete  Social Work Consult for [place] Planning/Counseling Complete  Palliative Care Screening Complete  Medication Review [employer]) Complete

## 2021-11-24 NOTE — Progress Notes (Signed)
33 Days Post-Op   Subjective/Chief Complaint: Reports feeling very full. Ate a few bites of eggs and toast, most of her grits at breakfast. Denies nausea/vomiting.  No fevers but still sinus tachycardia 115bpm.     Objective: Vital signs in last 24 hours: Temp:  [98.2 F (36.8 C)-99.6 F (37.6 C)] 98.2 F (36.8 C) (05/17 2153) Pulse Rate:  [112-116] 116 (05/17 2153) Resp:  [16-20] 16 (05/18 0800) BP: (137-153)/(63-76) 145/73 (05/18 0800) SpO2:  [96 %-98 %] 96 % (05/17 2153) Last BM Date : 10/24/21  Intake/Output from previous day: 05/17 0701 - 05/18 0700 In: 1185 [P.O.:1170; I.V.:10] Out: 1591 [Urine:1451; Drains:90; Stool:50] Intake/Output this shift: No intake/output data recorded.  General appearance: alert and cooperative Resp: clear to auscultation bilaterally Cardio: regular rate and rhythm GI: soft, appropriately tender. Ostomy and eakins pouch intact  draining feculent effluent   Lab Results:  Recent Labs    11/23/21 0329 11/24/21 0323  WBC 6.8 6.8  HGB 7.3* 6.9*  HCT 22.5* 21.3*  PLT 188 183   BMET Recent Labs    11/23/21 0329 11/24/21 0323  NA 129* 130*  K 4.0 4.5  CL 101 102  CO2 22 22  GLUCOSE 110* 115*  BUN 33* 30*  CREATININE 0.83 0.86  CALCIUM 7.4* 7.5*     Studies/Results: CT ABDOMEN PELVIS W CONTRAST  Result Date: 11/23/2021 CLINICAL DATA:  Intra-abdominal abscess, tachycardia and fever. Known leak. EXAM: CT ABDOMEN AND PELVIS WITH CONTRAST TECHNIQUE: Multidetector CT imaging of the abdomen and pelvis was performed using the standard protocol following bolus administration of intravenous contrast. RADIATION DOSE REDUCTION: This exam was performed according to the departmental dose-optimization program which includes automated exposure control, adjustment of the mA and/or kV according to patient size and/or use of iterative reconstruction technique. CONTRAST:  142m OMNIPAQUE IOHEXOL 300 MG/ML  SOLN COMPARISON:  CT abdomen and pelvis  11/17/2021 FINDINGS: Lower chest: Small right and moderate left pleural effusions are present. These have increased from prior. There is left lower lobe atelectasis. Hepatobiliary: Gallstones are again seen. There is mild gallbladder wall edema, unchanged. There is no biliary ductal dilatation. No focal liver lesion identified. Pancreas: Unremarkable. No pancreatic ductal dilatation or surrounding inflammatory changes. Spleen: Normal in size without focal abnormality. Adrenals/Urinary Tract: Adrenal glands are unremarkable. Kidneys are normal, without renal calculi, focal lesion, or hydronephrosis. Bladder is unremarkable. 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 Colon appears nondilated. Percutaneous gastrostomy tube is in the body of the stomach. The stomach is nondilated. There are numerous small bowel loops are mildly dilated with air-fluid levels. These contain contrast. No definitive transition point visualized, but contrast is seen to the level of the distal small bowel. No significant contrast seen throughout the colon. No free air or pneumatosis identified. Appendix is not seen. Vascular/Lymphatic: Aortic atherosclerosis. No enlarged abdominal or pelvic lymph nodes. Reproductive: Prior hysterectomy. Other: Left pelvic percutaneous drainage catheter remains unchanged in position. Previously identified fluid collection in this area has resolved. Enhancing fluid collection in the posterior pelvis has decreased in size now measuring 2.8 x 5.2 by 3.8 cm (previously 6.4 by 3.2 by 4.6 cm). Enhancing fluid collection in the presacral space has not significantly changed measuring 3.5 x 1.7 x 1.4 cm image 2/58. Enhancing fluid collection along the left pelvic sidewall has not significantly changed measuring 1.6 x 3.1 by 2.3 cm. No new fluid collections are identified. No ascites. Body wall edema has increased. Midline abdominal wound is again noted. Musculoskeletal: L2 compression  deformity is  stable. There stable severe degenerative changes at L3-L4. IMPRESSION: 1. Left-sided pigtail catheter in place. Previously identified fluid collection in this region has resolved. 2. Central pelvic enhancing fluid collection has decreased in size. Other pelvic fluid collections are stable. 3. Diffusely dilated small bowel containing contrast worrisome for small bowel ileus or partial distal small bowel obstruction. 4. Increasing body wall edema. 5. Again seen is a left lower quadrant colostomy. 6. Increasing bilateral pleural effusions. 7. Cholelithiasis with stable gallbladder wall edema. Correlate clinically for cholecystitis. 8.  Aortic Atherosclerosis (ICD10-I70.0). Electronically Signed   By: Ronney Asters M.D.   On: 11/23/2021 16:25   DG CHEST PORT 1 VIEW  Result Date: 11/23/2021 CLINICAL DATA:  Dyspnea. EXAM: PORTABLE CHEST 1 VIEW COMPARISON:  Nov 07, 2021. FINDINGS: The heart size and mediastinal contours are within normal limits. Both lungs are clear. Left-sided PICC line is noted with distal tip in expected position of right atrium. Endotracheal tube has been removed. Moderately displaced proximal right humeral fracture is again noted. IMPRESSION: No acute cardiopulmonary abnormality seen. Electronically Signed   By: Marijo Conception M.D.   On: 11/23/2021 12:47    Anti-infectives: Anti-infectives (From admission, onward)    Start     Dose/Rate Route Frequency Ordered Stop   11/14/21 0800  piperacillin-tazobactam (ZOSYN) IVPB 3.375 g        3.375 g 12.5 mL/hr over 240 Minutes Intravenous Every 8 hours 11/14/21 0739     11/04/21 1000  piperacillin-tazobactam (ZOSYN) IVPB 3.375 g        3.375 g 12.5 mL/hr over 240 Minutes Intravenous Every 8 hours 11/04/21 0935 11/11/21 1312   10/27/21 1600  cefTRIAXone (ROCEPHIN) 2 g in sodium chloride 0.9 % 100 mL IVPB  Status:  Discontinued        2 g 200 mL/hr over 30 Minutes Intravenous Every 24 hours 10/27/21 0843 10/27/21 1106   10/27/21 1600   piperacillin-tazobactam (ZOSYN) IVPB 3.375 g        3.375 g 12.5 mL/hr over 240 Minutes Intravenous Every 8 hours 10/27/21 1106 11/03/21 2000   10/27/21 1400  metroNIDAZOLE (FLAGYL) IVPB 500 mg  Status:  Discontinued        500 mg 100 mL/hr over 60 Minutes Intravenous Every 12 hours 10/27/21 0843 10/27/21 1106   10/24/21 1600  piperacillin-tazobactam (ZOSYN) IVPB 3.375 g  Status:  Discontinued        3.375 g 12.5 mL/hr over 240 Minutes Intravenous Every 8 hours 10/24/21 1422 10/27/21 0843   10/24/21 1000  cefTRIAXone (ROCEPHIN) 2 g in sodium chloride 0.9 % 100 mL IVPB  Status:  Discontinued        2 g 200 mL/hr over 30 Minutes Intravenous Every 24 hours 10/24/21 0747 10/24/21 1357   10/23/21 1530  vancomycin (VANCOCIN) IVPB 1000 mg/200 mL premix        1,000 mg 200 mL/hr over 60 Minutes Intravenous  Once 10/23/21 1439 10/23/21 1547   10/23/21 0200  ceFEPIme (MAXIPIME) 2 g in sodium chloride 0.9 % 100 mL IVPB  Status:  Discontinued        2 g 200 mL/hr over 30 Minutes Intravenous Every 12 hours 10/22/21 2032 10/24/21 0747   10/22/21 2200  cefOXitin (MEFOXIN) 2 g in sodium chloride 0.9 % 100 mL IVPB  Status:  Discontinued        2 g 200 mL/hr over 30 Minutes Intravenous Every 8 hours 10/22/21 1706 10/22/21 2032   10/22/21 1956  vancomycin  variable dose per unstable renal function (pharmacist dosing)  Status:  Discontinued         Does not apply See admin instructions 10/22/21 1957 10/23/21 1917   10/22/21 1245  vancomycin (VANCOCIN) IVPB 1000 mg/200 mL premix        1,000 mg 200 mL/hr over 60 Minutes Intravenous  Once 10/22/21 1241 10/22/21 1402   10/22/21 1245  ceFEPIme (MAXIPIME) 2 g in sodium chloride 0.9 % 100 mL IVPB        2 g 200 mL/hr over 30 Minutes Intravenous  Once 10/22/21 1241 10/22/21 1321   10/22/21 1230  metroNIDAZOLE (FLAGYL) IVPB 500 mg  Status:  Discontinued        500 mg 100 mL/hr over 60 Minutes Intravenous Every 12 hours 10/22/21 1222 10/24/21 1357        Assessment/Plan: s/p Procedure(s): EXPLORATORY LAPAROTOMY hartmans (N/A) POD#33 - PERFORATED SIGMOID COLON - status post EX LAP, HARTMAN'S PROCEDURE, Gastrostomy tube placement - 4/78/2956 Dr. 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 WOCN following for colostomy and pouching of fistula - CT 5/4 and 5/11 with pelvic fluid collection measuring 6 x 3.2 cm. Discussed with IR and not felt amenable/safe for drainage. -  G-tube clamped 5/7. Patient tolerating. Tolerating ice chips/sips. SLP following  - CT A/P repeated 5/17 due to low grade fever and tachycardia - shows interval decrease in intra abdominal fluid collections. Pleural effusion. Generalized body wall edema.   FEN - G-tube capped, TPN, diet SOFT starting ; prealbumin 18.2 on 5/13 VTE - SCDs, SQH ID - zosyn 4/17 - 4/27; 4/28-5/6, 5/8 >>   Continue IV abx - do not have source control at this time, pelvic abscess not amenable to drainage but are improving. Diuresis per medical service.   continue diet per SLP and monitor drain/fistula output. No emergent surgical needs. Continue to try to avoid surgery if possible as she is only 4 weeks out from her last operation - her abdomen would be scarred in and high risk for complications. We will continue to follow closely.   LOS: 33 days    Jill Alexanders 11/24/2021

## 2021-11-24 NOTE — Progress Notes (Signed)
PT Cancellation Note  Patient Details Name: Jessica [name] MRN: [id number] DOB: 01/17/1961   Cancelled Treatment:     Critical Lab value HgB 6.9 Will need to hold off Physical Therapy today.  Rica Koyanagi  PTA Acute  Rehabilitation Services Pager      [phone] Office      [phone]

## 2021-11-25 LAB — TYPE AND SCREEN
ABO/RH(D): O POS
Antibody Screen: NEGATIVE
Unit division: 0

## 2021-11-25 LAB — COMPREHENSIVE METABOLIC PANEL
ALT: 27 U/L (ref 0–44)
AST: 25 U/L (ref 15–41)
Albumin: 1.9 g/dL — ABNORMAL LOW (ref 3.5–5.0)
Alkaline Phosphatase: 115 U/L (ref 38–126)
Anion gap: 5 (ref 5–15)
BUN: 31 mg/dL — ABNORMAL HIGH (ref 6–20)
CO2: 22 mmol/L (ref 22–32)
Calcium: 7.5 mg/dL — ABNORMAL LOW (ref 8.9–10.3)
Chloride: 98 mmol/L (ref 98–111)
Creatinine, Ser: 0.78 mg/dL (ref 0.44–1.00)
GFR, Estimated: >60 mL/min (ref >60)
Glucose, Bld: 118 mg/dL — ABNORMAL HIGH (ref 70–99)
Potassium: 4.5 mmol/L (ref 3.5–5.1)
Sodium: 125 mmol/L — ABNORMAL LOW (ref 135–145)
Total Bilirubin: 0.5 mg/dL (ref 0.3–1.2)
Total Protein: 5.9 g/dL — ABNORMAL LOW (ref 6.5–8.1)

## 2021-11-25 LAB — BPAM RBC
Blood Product Expiration Date: 202306192359
ISSUE DATE / TIME: 202305181323
Unit Type and Rh: 5100

## 2021-11-25 LAB — CBC WITH DIFFERENTIAL/PLATELET
Abs Immature Granulocytes: 0.02 10*3/uL (ref 0.00–0.07)
Basophils Absolute: 0 10*3/uL (ref 0.0–0.1)
Basophils Relative: 0 %
Eosinophils Absolute: 0.6 10*3/uL — ABNORMAL HIGH (ref 0.0–0.5)
Eosinophils Relative: 9 %
HCT: 24.5 % — ABNORMAL LOW (ref 36.0–46.0)
Hemoglobin: 8 g/dL — ABNORMAL LOW (ref 12.0–15.0)
Immature Granulocytes: 0 %
Lymphocytes Relative: 26 %
Lymphs Abs: 1.8 10*3/uL (ref 0.7–4.0)
MCH: 29.3 pg (ref 26.0–34.0)
MCHC: 32.7 g/dL (ref 30.0–36.0)
MCV: 89.7 fL (ref 80.0–100.0)
Monocytes Absolute: 0.6 10*3/uL (ref 0.1–1.0)
Monocytes Relative: 8 %
Neutro Abs: 4 10*3/uL (ref 1.7–7.7)
Neutrophils Relative %: 57 %
Platelets: 181 10*3/uL (ref 150–400)
RBC: 2.73 MIL/uL — ABNORMAL LOW (ref 3.87–5.11)
RDW: 16.6 % — ABNORMAL HIGH (ref 11.5–15.5)
WBC: 7 10*3/uL (ref 4.0–10.5)
nRBC: 0 % (ref 0.0–0.2)

## 2021-11-25 LAB — PHOSPHORUS: Phosphorus: 3.6 mg/dL (ref 2.5–4.6)

## 2021-11-25 LAB — GLUCOSE, CAPILLARY
Glucose-Capillary: 109 mg/dL — ABNORMAL HIGH (ref 70–99)
Glucose-Capillary: 123 mg/dL — ABNORMAL HIGH (ref 70–99)
Glucose-Capillary: 131 mg/dL — ABNORMAL HIGH (ref 70–99)
Glucose-Capillary: 139 mg/dL — ABNORMAL HIGH (ref 70–99)

## 2021-11-25 LAB — HEMOGLOBIN AND HEMATOCRIT, BLOOD
HCT: 24.1 % — ABNORMAL LOW (ref 36.0–46.0)
Hemoglobin: 8.1 g/dL — ABNORMAL LOW (ref 12.0–15.0)

## 2021-11-25 LAB — MAGNESIUM: Magnesium: 1.7 mg/dL (ref 1.7–2.4)

## 2021-11-25 MED ORDER — ACETAMINOPHEN 325 MG PO TABS
650.0000 mg | ORAL_TABLET | Freq: Four times a day (QID) | ORAL | Status: DC | PRN
  Administered 2021-11-25 – 2021-11-28 (×5): 650 mg via ORAL
  Filled 2021-11-25 (×6): qty 2

## 2021-11-25 MED ORDER — MAGNESIUM SULFATE IN D5W 1-5 GM/100ML-% IV SOLN
1.0000 g | Freq: Once | INTRAVENOUS | Status: AC
Start: 2021-11-25 — End: 2021-11-26
  Administered 2021-11-25: 1 g via INTRAVENOUS
  Filled 2021-11-25: qty 100

## 2021-11-25 MED ORDER — FUROSEMIDE 10 MG/ML IJ SOLN
60.0000 mg | Freq: Once | INTRAMUSCULAR | Status: AC
  Administered 2021-11-25: 60 mg via INTRAVENOUS
  Filled 2021-11-25: qty 6

## 2021-11-25 MED ORDER — SODIUM CHLORIDE 1 G PO TABS
1.0000 g | ORAL_TABLET | Freq: Two times a day (BID) | ORAL | Status: DC
  Administered 2021-11-25 – 2021-11-28 (×7): 1 g via ORAL
  Filled 2021-11-25 (×7): qty 1

## 2021-11-25 MED ORDER — TRAVASOL 10 % IV SOLN
INTRAVENOUS | Status: AC
  Filled 2021-11-25: qty 1094.4

## 2021-11-25 NOTE — Progress Notes (Signed)
34 Days Post-Op   Subjective/Chief Complaint: Reports less abdominal distention compared to yesterday. States she didn't eat much lunch/dinner due to abd distention. Says it is improved today and she has been hearing flatus in her ostomy pouch.    HR 108 during my exam which is improved  Objective: Vital signs in last 24 hours: Temp:  [98.4 F (36.9 C)-100.6 F (38.1 C)] 99.5 F (37.5 C) (05/19 0635) Pulse Rate:  [109-122] 117 (05/18 2125) Resp:  [17-21] 17 (05/19 0400) BP: (139-174)/(77-90) 139/82 (05/19 0400) SpO2:  [95 %-100 %] 100 % (05/19 0635) Last BM Date : 11/24/21  Intake/Output from previous day: 05/18 0701 - 05/19 0700 In: 2655 [P.O.:1080; I.V.:917.5; Blood:370; IV Piggyback:282.5] Out: 2350 [Urine:2000; Drains:350] Intake/Output this shift: No intake/output data recorded.  General appearance: alert and cooperative Resp: clear to auscultation bilaterally Cardio: regular rate and rhythm GI: soft, appropriately tender. Ostomy and eakins pouch intact draining feculent effluent, IR drain w/ feculent drainage.  Ext: mild pitting edema of the feet and ankles.  Lab Results:  Recent Labs    11/24/21 0323 11/25/21 0040 11/25/21 0046  WBC 6.8 7.0  --   HGB 6.9* 8.0* 8.1*  HCT 21.3* 24.5* 24.1*  PLT 183 181  --    BMET Recent Labs    11/24/21 0323 11/25/21 0040  NA 130* 125*  K 4.5 4.5  CL 102 98  CO2 22 22  GLUCOSE 115* 118*  BUN 30* 31*  CREATININE 0.86 0.78  CALCIUM 7.5* 7.5*     Studies/Results: CT ABDOMEN PELVIS W CONTRAST  Result Date: 11/23/2021 CLINICAL DATA:  Intra-abdominal abscess, tachycardia and fever. Known leak. EXAM: CT ABDOMEN AND PELVIS WITH CONTRAST TECHNIQUE: Multidetector CT imaging of the abdomen and pelvis was performed using the standard protocol following bolus administration of intravenous contrast. RADIATION DOSE REDUCTION: This exam was performed according to the departmental dose-optimization program which includes automated  exposure control, adjustment of the mA and/or kV according to patient size and/or use of iterative reconstruction technique. CONTRAST:  138m OMNIPAQUE IOHEXOL 300 MG/ML  SOLN COMPARISON:  CT abdomen and pelvis 11/17/2021 FINDINGS: Lower chest: Small right and moderate left pleural effusions are present. These have increased from prior. There is left lower lobe atelectasis. Hepatobiliary: Gallstones are again seen. There is mild gallbladder wall edema, unchanged. There is no biliary ductal dilatation. No focal liver lesion identified. Pancreas: Unremarkable. No pancreatic ductal dilatation or surrounding inflammatory changes. Spleen: Normal in size without focal abnormality. Adrenals/Urinary Tract: Adrenal glands are unremarkable. Kidneys are normal, without renal calculi, focal lesion, or hydronephrosis. Bladder is unremarkable. 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 Colon appears nondilated. Percutaneous gastrostomy tube is in the body of the stomach. The stomach is nondilated. There are numerous small bowel loops are mildly dilated with air-fluid levels. These contain contrast. No definitive transition point visualized, but contrast is seen to the level of the distal small bowel. No significant contrast seen throughout the colon. No free air or pneumatosis identified. Appendix is not seen. Vascular/Lymphatic: Aortic atherosclerosis. No enlarged abdominal or pelvic lymph nodes. Reproductive: Prior hysterectomy. Other: Left pelvic percutaneous drainage catheter remains unchanged in position. Previously identified fluid collection in this area has resolved. Enhancing fluid collection in the posterior pelvis has decreased in size now measuring 2.8 x 5.2 by 3.8 cm (previously 6.4 by 3.2 by 4.6 cm). Enhancing fluid collection in the presacral space has not significantly changed measuring 3.5 x 1.7 x 1.4 cm image 2/58. Enhancing fluid collection along  the left pelvic sidewall has not significantly  changed measuring 1.6 x 3.1 by 2.3 cm. No new fluid collections are identified. No ascites. Body wall edema has increased. Midline abdominal wound is again noted. Musculoskeletal: L2 compression deformity is stable. There stable severe degenerative changes at L3-L4. IMPRESSION: 1. Left-sided pigtail catheter in place. Previously identified fluid collection in this region has resolved. 2. Central pelvic enhancing fluid collection has decreased in size. Other pelvic fluid collections are stable. 3. Diffusely dilated small bowel containing contrast worrisome for small bowel ileus or partial distal small bowel obstruction. 4. Increasing body wall edema. 5. Again seen is a left lower quadrant colostomy. 6. Increasing bilateral pleural effusions. 7. Cholelithiasis with stable gallbladder wall edema. Correlate clinically for cholecystitis. 8.  Aortic Atherosclerosis (ICD10-I70.0). Electronically Signed   By: Ronney Asters M.D.   On: 11/23/2021 16:25   DG CHEST PORT 1 VIEW  Result Date: 11/23/2021 CLINICAL DATA:  Dyspnea. EXAM: PORTABLE CHEST 1 VIEW COMPARISON:  Nov 07, 2021. FINDINGS: The heart size and mediastinal contours are within normal limits. Both lungs are clear. Left-sided PICC line is noted with distal tip in expected position of right atrium. Endotracheal tube has been removed. Moderately displaced proximal right humeral fracture is again noted. IMPRESSION: No acute cardiopulmonary abnormality seen. Electronically Signed   By: Marijo Conception M.D.   On: 11/23/2021 12:47    Anti-infectives: Anti-infectives (From admission, onward)    Start     Dose/Rate Route Frequency Ordered Stop   11/14/21 0800  piperacillin-tazobactam (ZOSYN) IVPB 3.375 g        3.375 g 12.5 mL/hr over 240 Minutes Intravenous Every 8 hours 11/14/21 0739     11/04/21 1000  piperacillin-tazobactam (ZOSYN) IVPB 3.375 g        3.375 g 12.5 mL/hr over 240 Minutes Intravenous Every 8 hours 11/04/21 0935 11/11/21 1312   10/27/21 1600   cefTRIAXone (ROCEPHIN) 2 g in sodium chloride 0.9 % 100 mL IVPB  Status:  Discontinued        2 g 200 mL/hr over 30 Minutes Intravenous Every 24 hours 10/27/21 0843 10/27/21 1106   10/27/21 1600  piperacillin-tazobactam (ZOSYN) IVPB 3.375 g        3.375 g 12.5 mL/hr over 240 Minutes Intravenous Every 8 hours 10/27/21 1106 11/03/21 2000   10/27/21 1400  metroNIDAZOLE (FLAGYL) IVPB 500 mg  Status:  Discontinued        500 mg 100 mL/hr over 60 Minutes Intravenous Every 12 hours 10/27/21 0843 10/27/21 1106   10/24/21 1600  piperacillin-tazobactam (ZOSYN) IVPB 3.375 g  Status:  Discontinued        3.375 g 12.5 mL/hr over 240 Minutes Intravenous Every 8 hours 10/24/21 1422 10/27/21 0843   10/24/21 1000  cefTRIAXone (ROCEPHIN) 2 g in sodium chloride 0.9 % 100 mL IVPB  Status:  Discontinued        2 g 200 mL/hr over 30 Minutes Intravenous Every 24 hours 10/24/21 0747 10/24/21 1357   10/23/21 1530  vancomycin (VANCOCIN) IVPB 1000 mg/200 mL premix        1,000 mg 200 mL/hr over 60 Minutes Intravenous  Once 10/23/21 1439 10/23/21 1547   10/23/21 0200  ceFEPIme (MAXIPIME) 2 g in sodium chloride 0.9 % 100 mL IVPB  Status:  Discontinued        2 g 200 mL/hr over 30 Minutes Intravenous Every 12 hours 10/22/21 2032 10/24/21 0747   10/22/21 2200  cefOXitin (MEFOXIN) 2 g in sodium chloride  0.9 % 100 mL IVPB  Status:  Discontinued        2 g 200 mL/hr over 30 Minutes Intravenous Every 8 hours 10/22/21 1706 10/22/21 2032   10/22/21 1956  vancomycin variable dose per unstable renal function (pharmacist dosing)  Status:  Discontinued         Does not apply See admin instructions 10/22/21 1957 10/23/21 1917   10/22/21 1245  vancomycin (VANCOCIN) IVPB 1000 mg/200 mL premix        1,000 mg 200 mL/hr over 60 Minutes Intravenous  Once 10/22/21 1241 10/22/21 1402   10/22/21 1245  ceFEPIme (MAXIPIME) 2 g in sodium chloride 0.9 % 100 mL IVPB        2 g 200 mL/hr over 30 Minutes Intravenous  Once 10/22/21 1241  10/22/21 1321   10/22/21 1230  metroNIDAZOLE (FLAGYL) IVPB 500 mg  Status:  Discontinued        500 mg 100 mL/hr over 60 Minutes Intravenous Every 12 hours 10/22/21 1222 10/24/21 1357       Assessment/Plan: s/p Procedure(s): EXPLORATORY LAPAROTOMY hartmans (N/A) POD#34 - PERFORATED SIGMOID COLON - status post EX LAP, HARTMAN'S PROCEDURE, Gastrostomy tube placement - 3/61/4431 Dr. 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 WOCN following for colostomy and pouching of fistula - CT 5/4 and 5/11 with pelvic fluid collection measuring 6 x 3.2 cm. Discussed with IR and not felt amenable/safe for drainage. -  G-tube clamped 5/7. Patient tolerating. Tolerating ice chips/sips. SLP following  - CT A/P repeated 5/17 due to low grade fever and tachycardia - shows interval decrease in intra abdominal fluid collections. Pleural effusion. Generalized body wall edema.   FEN - G-tube capped, TPN, diet SOFT starting ; prealbumin 18.2 on 5/13 VTE - SCDs, SQH ID - zosyn 4/17 - 4/27; 4/28-5/6, 5/8 >>   Continue IV abx - do not have source control at this time, pelvic abscess not amenable to drainage but are improving. Diuresis per medical service.    continue diet per SLP and monitor drain/fistula output. eakins and IR drain output are increased today from the last few days, monitor closely. No emergent surgical needs. Continue to try to avoid surgery if possible as she is only 4.5 weeks out from her last operation - her abdomen would be scarred in and high risk for complications. We will continue to follow closely.  Patient has asked that I speak to a contact of hers  who is an ED physician in order to help her [place] better understand her medical condition. She is trying to find the phone number for me.   LOS: 34 days    Jill Alexanders 11/25/2021

## 2021-11-25 NOTE — Progress Notes (Signed)
PROGRESS NOTE    Jessica [name]  OMV:[id number] DOB: 11/18/1960 DOA: 10/22/2021 PCP: Deland Pretty, MD   Brief Narrative:  61 year old female with history of alcohol abuse, several alcohol withdrawals requiring admissions, tobacco use presented on 10/22/2021 with diffuse abdominal pain with CT of abdomen and pelvis showed pneumoperitoneum and free fluid.  She was started on broad-spectrum antibiotics and emergently taken to the OR and underwent exploratory laparotomy where she was found to have a perforated sigmoid colon.  She is status post Hartmann procedure and insertion of G-tube.  She was found to have Klebsiella bacteremia.  Hospital course complicated by development of left lower quadrant fluid collection status post IR drainage on 10/29/2021, drain cultures grew Pseudomonas.  This required having her drain adjusted and upsized on 510, fluid appeared feculent.  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  She had a repeat CT on 11/10/2021 with pelvic fluid collection measuring 6 x 3.2 cm.  This was discussed with IR and it was not felt safe or amenable to drainage.  She was continued on IV antibiotics.  ID was consulted.  Repeat CT scan showed stable pelvic abscess also remaining unable to be drained.  She is currently on TPN.  Palliative care was also consulted for goals of care discussion.  Currently remains full code.  Assessment & Plan:   Complicated intra-abdominal infection Perforated sigmoid colon status post expiratory laparotomy/Hartman's procedure/G-tube placement Septic shock: Present on admission, resolved Klebsiella bacteremia [facility] course as above. -As per recent ID recommendations, patient has been on IV Zosyn: Undetermined prolonged period given uncontrolled intra-abdominal infection with abscess not amenable to drainage. -General surgery following.  Continue TPN. Low-grade temperatures recently.  Follow repeat blood  cultures from 11/23/2021: Negative so far.  Follow general surgery recommendations.  Wound care as per general surgery and wound care nurse recommendations. General surgery repeated CT of the abdomen on 11/23/2021 which showed decreasing collections but findings worrisome for small bowel ileus versus partial obstruction. -Septic shock has already resolved.  Off pressors  Acute respiratory failure with hypoxia/aspiration pneumonia -Has required intubation and extubation twice.  Extubated again on 11/08/2021.  Currently on room air.  Acute metabolic encephalopathy -Multifactorial due to ICU delirium/infectious etiology/alcohol withdrawal -Mental status has mostly improved.  Monitor mental status. -Fall precautions  AKI -Resolved.  Essential hypertension -Monitor blood pressure.  Continue metoprolol and hydralazine.  Anemia of chronic disease -Has required 4 units packed red cells transfusion during the hospitalization with last transfusion being on 11/24/2021 for hemoglobin of 6.9. -Hemoglobin 8.1 today.  Monitor H&H.  Hyponatremia -Sodium continues to fall.  Currently on TPN.  Monitor.  Start salt tablets.  Monitor.  Severe protein calorie malnutrition/hypoalbuminemia Anorexia -Due to poor oral intake.  Continue TPN.  Nutrition following. -Currently on Megace as well  Hypomagnesemia -Resolved  Substance-induced mood disorder/anxiety -Continue lorazepam as needed.  Physical deconditioning -Will need SNF placement  DVT prophylaxis: Heparin subcutaneous Code Status: Full Family Communication: None at bedside Disposition Plan: Status is: Inpatient Remains inpatient appropriate because: Of severity of illness    Consultants: General surgery/palliative care/ID/PCCM/IR  Procedures: As above  Antimicrobials:  Anti-infectives (From admission, onward)    Start     Dose/Rate Route Frequency Ordered Stop   11/14/21 0800  piperacillin-tazobactam (ZOSYN) IVPB 3.375 g        3.375  g 12.5 mL/hr over 240 Minutes Intravenous Every 8 hours 11/14/21 0739     11/04/21 1000  piperacillin-tazobactam (ZOSYN) IVPB 3.375 g        3.375 g 12.5 mL/hr over 240 Minutes Intravenous Every 8 hours 11/04/21 0935 11/11/21 1312   10/27/21 1600  cefTRIAXone (ROCEPHIN) 2 g in sodium chloride 0.9 % 100 mL IVPB  Status:  Discontinued        2 g 200 mL/hr over 30 Minutes Intravenous Every 24 hours 10/27/21 0843 10/27/21 1106   10/27/21 1600  piperacillin-tazobactam (ZOSYN) IVPB 3.375 g        3.375 g 12.5 mL/hr over 240 Minutes Intravenous Every 8 hours 10/27/21 1106 11/03/21 2000   10/27/21 1400  metroNIDAZOLE (FLAGYL) IVPB 500 mg  Status:  Discontinued        500 mg 100 mL/hr over 60 Minutes Intravenous Every 12 hours 10/27/21 0843 10/27/21 1106   10/24/21 1600  piperacillin-tazobactam (ZOSYN) IVPB 3.375 g  Status:  Discontinued        3.375 g 12.5 mL/hr over 240 Minutes Intravenous Every 8 hours 10/24/21 1422 10/27/21 0843   10/24/21 1000  cefTRIAXone (ROCEPHIN) 2 g in sodium chloride 0.9 % 100 mL IVPB  Status:  Discontinued        2 g 200 mL/hr over 30 Minutes Intravenous Every 24 hours 10/24/21 0747 10/24/21 1357   10/23/21 1530  vancomycin (VANCOCIN) IVPB 1000 mg/200 mL premix        1,000 mg 200 mL/hr over 60 Minutes Intravenous  Once 10/23/21 1439 10/23/21 1547   10/23/21 0200  ceFEPIme (MAXIPIME) 2 g in sodium chloride 0.9 % 100 mL IVPB  Status:  Discontinued        2 g 200 mL/hr over 30 Minutes Intravenous Every 12 hours 10/22/21 2032 10/24/21 0747   10/22/21 2200  cefOXitin (MEFOXIN) 2 g in sodium chloride 0.9 % 100 mL IVPB  Status:  Discontinued        2 g 200 mL/hr over 30 Minutes Intravenous Every 8 hours 10/22/21 1706 10/22/21 2032   10/22/21 1956  vancomycin variable dose per unstable renal function (pharmacist dosing)  Status:  Discontinued         Does not apply See admin instructions 10/22/21 1957 10/23/21 1917   10/22/21 1245  vancomycin (VANCOCIN) IVPB 1000 mg/200 mL  premix        1,000 mg 200 mL/hr over 60 Minutes Intravenous  Once 10/22/21 1241 10/22/21 1402   10/22/21 1245  ceFEPIme (MAXIPIME) 2 g in sodium chloride 0.9 % 100 mL IVPB        2 g 200 mL/hr over 30 Minutes Intravenous  Once 10/22/21 1241 10/22/21 1321   10/22/21 1230  metroNIDAZOLE (FLAGYL) IVPB 500 mg  Status:  Discontinued        500 mg 100 mL/hr over 60 Minutes Intravenous Every 12 hours 10/22/21 1222 10/24/21 1357        Subjective: Patient seen and examined at bedside.  Poor historian.  No agitation, worsening shortness of breath, vomiting reported.  Still having low-grade fevers.   Objective: Vitals:   11/25/21 0000 11/25/21 0400 11/25/21 0602 11/25/21 0635  BP: (!) 163/87 139/82    Pulse:      Resp: 19 17    Temp:  99.6 F (37.6 C) (!) 100.6 F (38.1 C) 99.5 F (37.5 C)  TempSrc:  Oral  Oral  SpO2: 95% 100%  100%  Weight:      Height:        Intake/Output Summary (Last 24 hours) at 11/25/2021 1245 Last data filed at 11/25/2021  0600 Gross per 24 hour  Intake 2649.97 ml  Output 2350 ml  Net 299.97 ml    Filed Weights   11/18/21 0500 11/19/21 0330 11/21/21 0500  Weight: 55.3 kg 59.9 kg 60.8 kg    Examination:  General: No acute distress, currently on room air.  Chronically ill and deconditioned looking. ENT/neck: No elevated JVD.  No obvious masses  respiratory: Bilateral decreased breath sounds at bases with scattered crackles, no wheezing CVS: Rate is currently controlled; S1 and S2 are heard  abdominal: Soft, diffusely tender mildly and distended; no organomegaly, bowel sounds heard.  Eakin's pouch and ostomy bag are present Extremities: Mild lower extremity edema present; no cyanosis  CNS: Alert, awake and oriented.  No focal neurologic deficit.  Able to move extremities.   Lymph: No palpable cervical lymphadenopathy Skin: No obvious ecchymosis/other lesions Psych: Not agitated.  Flat affect.   Musculoskeletal: No obvious joint  deformity/tenderness/swelling      Data Reviewed: I have personally reviewed following labs and imaging studies  CBC: Recent Labs  Lab 11/21/21 0321 11/22/21 0536 11/23/21 0329 11/24/21 0323 11/25/21 0040 11/25/21 0046  WBC 7.6 7.1 6.8 6.8 7.0  --   NEUTROABS 4.0 4.2 3.5 3.8 4.0  --   HGB 8.2* 7.6* 7.3* 6.9* 8.0* 8.1*  HCT 24.3* 23.2* 22.5* 21.3* 24.5* 24.1*  MCV 88.0 89.2 90.7 90.6 89.7  --   PLT 233 196 188 183 181  --     Basic Metabolic Panel: Recent Labs  Lab 11/21/21 0321 11/22/21 0536 11/23/21 0329 11/24/21 0323 11/25/21 0040  NA 129* 131* 129* 130* 125*  K 3.8 3.5 4.0 4.5 4.5  CL 99 99 101 102 98  CO2 '23 26 22 22 22  '$ GLUCOSE 116* 128* 110* 115* 118*  BUN 32* 33* 33* 30* 31*  CREATININE 0.67 0.75 0.83 0.86 0.78  CALCIUM 7.6* 7.6* 7.4* 7.5* 7.5*  MG 1.6* 2.1 2.0 1.8 1.7  PHOS 3.2 3.4 3.5 3.9 3.6    GFR: Estimated Creatinine Clearance: 71.8 mL/min (by C-G formula based on SCr of 0.78 mg/dL). Liver Function Tests: Recent Labs  Lab 11/21/21 0321 11/22/21 0536 11/23/21 0329 11/24/21 0323 11/25/21 0040  AST '19 18 16 17 25  '$ ALT '21 21 20 21 27  '$ ALKPHOS 97 90 79 81 115  BILITOT 0.6 0.6 0.5 0.6 0.5  PROT 5.7* 5.6* 5.7* 5.5* 5.9*  ALBUMIN 1.9* 1.8* 1.8* 1.7* 1.9*    No results for input(s): LIPASE, AMYLASE in the last 168 hours. No results for input(s): AMMONIA in the last 168 hours. Coagulation Profile: No results for input(s): INR, PROTIME in the last 168 hours. Cardiac Enzymes: No results for input(s): CKTOTAL, CKMB, CKMBINDEX, TROPONINI in the last 168 hours. BNP (last 3 results) No results for input(s): PROBNP in the last 8760 hours. HbA1C: No results for input(s): HGBA1C in the last 72 hours. CBG: Recent Labs  Lab 11/24/21 0551 11/24/21 1219 11/24/21 1844 11/24/21 2359 11/25/21 0625  GLUCAP 119* 117* 136* 131* 123*    Lipid Profile: Recent Labs    11/24/21 0323  TRIG 69    Thyroid Function Tests: No results for input(s):  TSH, T4TOTAL, FREET4, T3FREE, THYROIDAB in the last 72 hours. Anemia Panel: No results for input(s): VITAMINB12, FOLATE, FERRITIN, TIBC, IRON, RETICCTPCT in the last 72 hours. Sepsis Labs: No results for input(s): PROCALCITON, LATICACIDVEN in the last 168 hours.  Recent Results (from the past 240 hour(s))  Culture, blood (Routine X 2) w Reflex to ID Panel  Status: None (Preliminary result)   Collection Time: 11/23/21 10:51 AM   Specimen: BLOOD  Result Value Ref Range Status   Specimen Description   Final    BLOOD RIGHT ANTECUBITAL Performed at [facility] [address] [place], [place] [address]    Special Requests   Final    IN PEDIATRIC BOTTLE Blood Culture adequate volume Performed at [facility] [address] [place], [place] [address]    Culture   Final    NO GROWTH < 24 HOURS Performed at [facility] [address], [place], [place] [address]    Report Status PENDING  Incomplete  Culture, blood (Routine X 2) w Reflex to ID Panel     Status: None (Preliminary result)   Collection Time: 11/23/21 11:48 AM   Specimen: BLOOD  Result Value Ref Range Status   Specimen Description   Final    BLOOD BLOOD RIGHT FOREARM Performed at [facility] [address], [place], [place] [address]    Special Requests   Final    BOTTLES DRAWN AEROBIC ONLY Blood Culture adequate volume Performed at [facility] [address], [place], [place] [address]    Culture   Final    NO GROWTH < 24 HOURS Performed at [facility] [address] [place],  [address]    Report Status PENDING  Incomplete          Radiology Studies: CT ABDOMEN PELVIS W CONTRAST  Result Date: 11/23/2021 CLINICAL DATA:  Intra-abdominal abscess, tachycardia and fever. Known leak. EXAM: CT ABDOMEN AND PELVIS WITH CONTRAST TECHNIQUE: Multidetector CT imaging of the abdomen and pelvis was performed using the  standard protocol following bolus administration of intravenous contrast. RADIATION DOSE REDUCTION: This exam was performed according to the departmental dose-optimization program which includes automated exposure control, adjustment of the mA and/or kV according to patient size and/or use of iterative reconstruction technique. CONTRAST:  151m OMNIPAQUE IOHEXOL 300 MG/ML  SOLN COMPARISON:  CT abdomen and pelvis 11/17/2021 FINDINGS: Lower chest: Small right and moderate left pleural effusions are present. These have increased from prior. There is left lower lobe atelectasis. Hepatobiliary: Gallstones are again seen. There is mild gallbladder wall edema, unchanged. There is no biliary ductal dilatation. No focal liver lesion identified. Pancreas: Unremarkable. No pancreatic ductal dilatation or surrounding inflammatory changes. Spleen: Normal in size without focal abnormality. Adrenals/Urinary Tract: Adrenal glands are unremarkable. Kidneys are normal, without renal calculi, focal lesion, or hydronephrosis. Bladder is unremarkable. 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 Colon appears nondilated. Percutaneous gastrostomy tube is in the body of the stomach. The stomach is nondilated. There are numerous small bowel loops are mildly dilated with air-fluid levels. These contain contrast. No definitive transition point visualized, but contrast is seen to the level of the distal small bowel. No significant contrast seen throughout the colon. No free air or pneumatosis identified. Appendix is not seen. Vascular/Lymphatic: Aortic atherosclerosis. No enlarged abdominal or pelvic lymph nodes. Reproductive: Prior hysterectomy. Other: Left pelvic percutaneous drainage catheter remains unchanged in position. Previously identified fluid collection in this area has resolved. Enhancing fluid collection in the posterior pelvis has decreased in size now measuring 2.8 x 5.2 by 3.8 cm (previously 6.4 by 3.2 by 4.6  cm). Enhancing fluid collection in the presacral space has not significantly changed measuring 3.5 x 1.7 x 1.4 cm image 2/58. Enhancing fluid collection along the left pelvic sidewall has not significantly  changed measuring 1.6 x 3.1 by 2.3 cm. No new fluid collections are identified. No ascites. Body wall edema has increased. Midline abdominal wound is again noted. Musculoskeletal: L2 compression deformity is stable. There stable severe degenerative changes at L3-L4. IMPRESSION: 1. Left-sided pigtail catheter in place. Previously identified fluid collection in this region has resolved. 2. Central pelvic enhancing fluid collection has decreased in size. Other pelvic fluid collections are stable. 3. Diffusely dilated small bowel containing contrast worrisome for small bowel ileus or partial distal small bowel obstruction. 4. Increasing body wall edema. 5. Again seen is a left lower quadrant colostomy. 6. Increasing bilateral pleural effusions. 7. Cholelithiasis with stable gallbladder wall edema. Correlate clinically for cholecystitis. 8.  Aortic Atherosclerosis (ICD10-I70.0). Electronically Signed   By: Ronney Asters M.D.   On: 11/23/2021 16:25   DG CHEST PORT 1 VIEW  Result Date: 11/23/2021 CLINICAL DATA:  Dyspnea. EXAM: PORTABLE CHEST 1 VIEW COMPARISON:  Nov 07, 2021. FINDINGS: The heart size and mediastinal contours are within normal limits. Both lungs are clear. Left-sided PICC line is noted with distal tip in expected position of right atrium. Endotracheal tube has been removed. Moderately displaced proximal right humeral fracture is again noted. IMPRESSION: No acute cardiopulmonary abnormality seen. Electronically Signed   By: Marijo Conception M.D.   On: 11/23/2021 12:47        Scheduled Meds:  sodium chloride   Intravenous Once   vitamin C  500 mg Per Tube BID   chlorhexidine  15 mL Mouth Rinse BID   Chlorhexidine Gluconate Cloth  6 each Topical Daily   feeding supplement  237 mL Oral BID BM    heparin injection (subcutaneous)  5,000 Units Subcutaneous Q8H   mouth rinse  15 mL Mouth Rinse q12n4p   megestrol  400 mg Oral BID   metoprolol tartrate  50 mg Oral BID   nicotine  14 mg Transdermal Daily   pantoprazole (PROTONIX) IV  40 mg Intravenous Q24H   sodium chloride flush  10-40 mL Intracatheter Q12H   sodium chloride flush  5 mL Intracatheter Q12H   sodium chloride flush  5 mL Intracatheter Q8H   vitamin A  10,000 Units Per Tube Daily   Continuous Infusions:  sodium chloride 250 mL (11/22/21 0019)   magnesium sulfate bolus IVPB     piperacillin-tazobactam (ZOSYN)  IV 12.5 mL/hr at 11/25/21 0259   TPN ADULT (ION) 95 mL/hr at 11/25/21 0259          Aline August, MD [facility] 11/25/2021, 8:32 AM

## 2021-11-25 NOTE — Progress Notes (Signed)
Referring Physician(s): Gross,S  Supervising Physician: Markus Daft  Patient Status:  [facility] - In-pt  Chief Complaint:  Abdominal abscess  Subjective: Pt doing ok; no acute changes; still has positional back /lower abdominal discomfort but not worsening  Allergies: Patient has no known allergies.  Medications: Prior to Admission medications   Medication Sig Start Date End Date Taking? Authorizing Provider  fluticasone (FLONASE) 50 MCG/ACT nasal spray Place 1-2 sprays into both nostrils daily as needed for allergies or rhinitis.   Yes [provider]  loratadine (CLARITIN) 10 MG tablet Take 10 mg by mouth daily as needed for allergies.   Yes [provider]  Multiple Vitamins-Minerals (MULTIVITAMIN ADULTS 50+) TABS Take 1 tablet by mouth every morning.   Yes [provider]  ondansetron (ZOFRAN) 8 MG tablet Take 8 mg by mouth daily as needed for nausea/vomiting. 10/10/21  Yes [provider]  cetirizine (ZYRTEC) 10 MG tablet Take 10 mg by mouth daily as needed for allergies.    [provider]  diclofenac Sodium (VOLTAREN) 1 % GEL Apply 4 g topically 4 (four) times daily. Patient not taking: Reported on 10/22/2021 09/21/21   Deno Etienne, DO  mirtazapine (REMERON SOL-TAB) 15 MG disintegrating tablet Take 1 tablet (15 mg total) by mouth at bedtime. 12/12/17 01/11/18  Arrien, Jimmy Picket, MD  morphine (MSIR) 15 MG tablet Take 0.5 tablets (7.5 mg total) by mouth every 4 (four) hours as needed for severe pain. Patient not taking: Reported on 10/22/2021 09/26/21   Vanetta Mulders, MD     Vital Signs: BP 139/82 (BP Location: Right Wrist)   Pulse (!) 117   Temp 99.5 F (37.5 C) (Oral)   Resp 17   Ht '5\' 10"'$  (1.778 m)   Wt 134 lb 0.6 oz (60.8 kg)   SpO2 100%   BMI 19.23 kg/m   Physical Examawake/alert; LLQ drain intact, insertion site ok, mildly tender, OP 100 cc feculent fluid  Imaging: CT ABDOMEN PELVIS W CONTRAST  Result Date:  11/23/2021 CLINICAL DATA:  Intra-abdominal abscess, tachycardia and fever. Known leak. EXAM: CT ABDOMEN AND PELVIS WITH CONTRAST TECHNIQUE: Multidetector CT imaging of the abdomen and pelvis was performed using the standard protocol following bolus administration of intravenous contrast. RADIATION DOSE REDUCTION: This exam was performed according to the departmental dose-optimization program which includes automated exposure control, adjustment of the mA and/or kV according to patient size and/or use of iterative reconstruction technique. CONTRAST:  176m OMNIPAQUE IOHEXOL 300 MG/ML  SOLN COMPARISON:  CT abdomen and pelvis 11/17/2021 FINDINGS: Lower chest: Small right and moderate left pleural effusions are present. These have increased from prior. There is left lower lobe atelectasis. Hepatobiliary: Gallstones are again seen. There is mild gallbladder wall edema, unchanged. There is no biliary ductal dilatation. No focal liver lesion identified. Pancreas: Unremarkable. No pancreatic ductal dilatation or surrounding inflammatory changes. Spleen: Normal in size without focal abnormality. Adrenals/Urinary Tract: Adrenal glands are unremarkable. Kidneys are normal, without renal calculi, focal lesion, or hydronephrosis. Bladder is unremarkable. 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 Colon appears nondilated. Percutaneous gastrostomy tube is in the body of the stomach. The stomach is nondilated. There are numerous small bowel loops are mildly dilated with air-fluid levels. These contain contrast. No definitive transition point visualized, but contrast is seen to the level of the distal small bowel. No significant contrast seen throughout the colon. No free air or pneumatosis identified. Appendix is not seen. Vascular/Lymphatic: Aortic atherosclerosis. No enlarged abdominal or pelvic lymph  nodes. Reproductive: Prior hysterectomy. Other: Left pelvic percutaneous drainage catheter remains unchanged in  position. Previously identified fluid collection in this area has resolved. Enhancing fluid collection in the posterior pelvis has decreased in size now measuring 2.8 x 5.2 by 3.8 cm (previously 6.4 by 3.2 by 4.6 cm). Enhancing fluid collection in the presacral space has not significantly changed measuring 3.5 x 1.7 x 1.4 cm image 2/58. Enhancing fluid collection along the left pelvic sidewall has not significantly changed measuring 1.6 x 3.1 by 2.3 cm. No new fluid collections are identified. No ascites. Body wall edema has increased. Midline abdominal wound is again noted. Musculoskeletal: L2 compression deformity is stable. There stable severe degenerative changes at L3-L4. IMPRESSION: 1. Left-sided pigtail catheter in place. Previously identified fluid collection in this region has resolved. 2. Central pelvic enhancing fluid collection has decreased in size. Other pelvic fluid collections are stable. 3. Diffusely dilated small bowel containing contrast worrisome for small bowel ileus or partial distal small bowel obstruction. 4. Increasing body wall edema. 5. Again seen is a left lower quadrant colostomy. 6. Increasing bilateral pleural effusions. 7. Cholelithiasis with stable gallbladder wall edema. Correlate clinically for cholecystitis. 8.  Aortic Atherosclerosis (ICD10-I70.0). Electronically Signed   By: Ronney Asters M.D.   On: 11/23/2021 16:25   DG CHEST PORT 1 VIEW  Result Date: 11/23/2021 CLINICAL DATA:  Dyspnea. EXAM: PORTABLE CHEST 1 VIEW COMPARISON:  Nov 07, 2021. FINDINGS: The heart size and mediastinal contours are within normal limits. Both lungs are clear. Left-sided PICC line is noted with distal tip in expected position of right atrium. Endotracheal tube has been removed. Moderately displaced proximal right humeral fracture is again noted. IMPRESSION: No acute cardiopulmonary abnormality seen. Electronically Signed   By: Marijo Conception M.D.   On: 11/23/2021 12:47    Labs:  CBC: Recent  Labs    11/22/21 0536 11/23/21 0329 11/24/21 0323 11/25/21 0040 11/25/21 0046  WBC 7.1 6.8 6.8 7.0  --   HGB 7.6* 7.3* 6.9* 8.0* 8.1*  HCT 23.2* 22.5* 21.3* 24.5* 24.1*  PLT 196 188 183 181  --     COAGS: Recent Labs    10/22/21 2119 10/29/21 0650 10/30/21 0337  INR 1.3* 1.2 1.2    BMP: Recent Labs    11/22/21 0536 11/23/21 0329 11/24/21 0323 11/25/21 0040  NA 131* 129* 130* 125*  K 3.5 4.0 4.5 4.5  CL 99 101 102 98  CO2 '26 22 22 22  '$ GLUCOSE 128* 110* 115* 118*  BUN 33* 33* 30* 31*  CALCIUM 7.6* 7.4* 7.5* 7.5*  CREATININE 0.75 0.83 0.86 0.78  GFRNONAA >60 >60 >60 >60    LIVER FUNCTION TESTS: Recent Labs    11/22/21 0536 11/23/21 0329 11/24/21 0323 11/25/21 0040  BILITOT 0.6 0.5 0.6 0.5  AST '18 16 17 25  '$ ALT '21 20 21 27  '$ ALKPHOS 90 79 81 115  PROT 5.6* 5.7* 5.5* 5.9*  ALBUMIN 1.8* 1.8* 1.7* 1.9*    Assessment and Plan: Pt with hx perf sigmoid colon, s/p expl lap, Hartman's, G tube placement 4/15; s/p drainage of post op abd abscess 4/22(10 fr to JP); s/p exchange/upsizing and repositioning of drain to 20 [place] (to bag) 5/10; 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 Left-sided pigtail catheter in place. Previously identified fluid collection in this  region has resolved. 2. Central pelvic enhancing fluid collection has decreased in size. Other pelvic fluid collections are stable. 3. Diffusely dilated small bowel containing contrast worrisome for small bowel ileus or partial distal small bowel obstruction. 4. Increasing body wall edema. 5. Again seen is a left lower quadrant colostomy. 6. Increasing bilateral pleural effusions. 7. Cholelithiasis with stable gallbladder wall edema. Correlate clinically for cholecystitis. 8.  Aortic Atherosclerosis    Pelvic coll are too high risk to drain per Dr. Kathlene Cote due to location and need for TG approach if done- now smaller on latest CT; Temp 99.5, WBC nl,hgb 8.1(6.9), creat nl, alb 1.9; cont current tx as per [facility]  Electronically Signed: D. Rowe Robert, PA-C 11/25/2021, 11:38 AM   I spent a total of 15 Minutes at the the patient's bedside AND on the patient's hospital floor or unit, greater than 50% of which was counseling/coordinating care for abdominal abscess drain    Patient ID: Jessica [name], female   DOB: [birth year], 60 y.o.   MRN: [id number]

## 2021-11-25 NOTE — Progress Notes (Signed)
PHARMACY - TOTAL PARENTERAL NUTRITION CONSULT NOTE   Indication: Prolonged ileus  Patient Measurements: Height: _0  (177.8 cm) Weight: 60.8 kg (134 lb 0.6 oz) IBW/kg (Calculated) : 68.5 TPN AdjBW (KG): 53.8 Body mass index is 19.23 kg/m. Usual Weight: 53.8 kg  Assessment: 61 yo F w/ PMH etoh use disorder, smoker, HTN, GERD. S/p Exp Lap Hartmans 10/22/2021 for perforated sigmoid colon, Dr. Marcello Moores.    Glucose / Insulin: no hx DM.  CBGs continue to be <150.  (insulin d/c 5/1) Electrolytes: Na low 125 (max conc of Na in TPN);  Mag 1.7 (Mag conc in TPN increased with new bag on 5/18); CorrCa, K, phos wnl  - goal for ileus: Mag > 2, K > 4, phos ~3 - CL and CO2 wnl Vitamins:   - Vitamin C <0.1 (4/22); receiving 200 mg ascorbic acid daily in TPN from [facility] - Vitamin A 14.2 (4/22); receiving 1 mg of vitamin A daily in TPN from [facility] - Unable to add additional ascorbic acid and vitamin A to TPN per TPN policy - 5/5 - initiated ascorbic acid 500 mg per tube BID and vitamin A 10,000 units per tube daily. Tube to be clamped x1 hour after administration then returned to suction.  Renal: SCr WNL; BUN 31 Hepatic: LFTs, Tbili, Alk Phos WNL; TG stable WNL.  - Albumin remains low - prealbumin 18.2 on 5/13  Intake / Output:  - I/O: + 305 mL -UOP: 1.4 ml/kg/hr.  - Drains output: 350 mL.  - Colostomy: nothing documented - No IVF GI Imaging:  - 4/15 CTA: bowel perforation - 4/17 KUB ileus or pSBO - 4/20 CTA: severe ileus or pSBO - 5/04 CT: new pelvic collection not amenable to drainage, likely an abscess - 5/11 CTa/p: fluid collections unchanged from 5/4 - 5/18 abd CT: Central pelvic enhancing fluid collection has decreased in size. Diffusely dilated small bowel containing contrast worrisome for small bowel ileus or partial distal small bowel obstruction. Increasing bilateral pleural effusions. GI Surgeries / Procedures:  - 10/22/21 s/p EXPLORATORY LAPAROTOMY hartmans for perforated sigmoid colon -  4/23 IR placed drain LLQ - 5/10 Drainage cath exchanged  Central access: CVC TL placed 10/23/21 TPN start date: 10/26/21  Nutritional Goals: Goal TPN rate is 95 mL/hr (provides 109 g of protein and 2170 kcals per day)   RD Assessment: (5/10) Estimated Needs Total Energy Estimated Needs: 2000-2200 kcal Total Protein Estimated Needs: 105-125 grams Total Fluid Estimated Needs: >/= 2.2 L/day  Current Nutrition:  - TPN - 5/10 Boost 1 container bid (pt refused most doses >> 5/17 - 5/18 Ensure 1 can bid - Diet advanced from NPO to CLD on 5/12 --> adv to soft diet on 5/15   - on megace - 5/17: RN noted that appetite is poor on 5/16 - 5/18: 5% - 75% meals documented as consumed - 5/19: consuming ~50% meals  Plan:   Now:  - magnesium sulfate 1 gm IV x1  At 18:00:   Continue TPN @  95 mL/hr to provide 100% of goals Electrolytes in TPN:  Continue Na 150 mEq/L Continue K 40 mEq/L Continue Ca 3 mEq/L Continue Mg 10 mEq/L Continue Phos 2 mmol/L Continue Cl:Ac 1:2 Continue standard [facility] and trace elements to TPN Continue folic acid & thiamine in TPN Monitor TPN labs on Mon/Thurs CMET daily thru 5/21 and  phos/mag ordered as daily by MD Follow up oral intake for appropriate wean off TPN    Jessica [name], Merleen Nicely, PharmD, BCPS Clinical Pharmacist  11/25/2021 7:17 AM

## 2021-11-25 NOTE — Progress Notes (Signed)
Occupational Therapy Treatment Patient Details Name: Jessica [name] MRN: [id number] DOB: 1960-12-09 Today's Date: 11/25/2021   History of present illness Neelah A Bently is an 61 y.o. female past medical history significant for alcohol abuse, with admissions for severe alcohol withdrawals requiring ICU admissions tobacco use presented to the ED on 10/22/2021 diffuse abdominal pain that started the day prior to admission.  CT of the abdomen pelvis showed pneumoperitoneum and free fluid, severe sepsis with started on broad-spectrum antibiotics fluid resuscitated surgery was consulted was taken emergently to the OR underwent exploratory laparotomy and was found to have a perforated sigmoid colon, status post Hartmann procedure and insertion of the G-tube which was left to drain to gravity, JP in pelvis was noted to have feculent peritonitis in the pelvis.  ICU course was complicated by Klebsiella bacteremia profound caloric protein malnutrition requiring TPN, acute respiratory failure with hypoxia and tachycardia with failure to progress   OT comments  Treatment limited by patient's fear, pain and anxiety. She was supervision to transfer to edge of bed with heavy use of bed rails and needing to rest  after transfer. She was min assist to stand with RW and take steps to recliner. She is fearful of the rolling walker though no loss of balance or buckling of lower extremities. She was set up to eat lunch in the chair. Will hope to progress towards ADLs next treatment.   Recommendations for follow up therapy are one component of a multi-disciplinary discharge planning process, led by the attending physician.  Recommendations may be updated based on patient status, additional functional criteria and insurance authorization.    Follow Up Recommendations  Skilled nursing-short term rehab (<3 hours/day)    Assistance Recommended at Discharge Frequent or constant Supervision/Assistance  Patient can return home  with the following  Two people to help with walking and/or transfers;A lot of help with bathing/dressing/bathroom;Assistance with cooking/housework;Direct supervision/assist for medications management;Direct supervision/assist for financial management;Assist for transportation;Help with stairs or ramp for entrance   Equipment Recommendations  BSC/3in1    Recommendations for Other Services      Precautions / Restrictions Precautions Precautions: Fall Precaution Comments: multiple lines--> L JP drain, gastrostomy tube, triple lumen central line, colostomy Restrictions Weight Bearing Restrictions: No RUE Weight Bearing: Non weight bearing Other Position/Activity Restrictions: recent R UE/shoulder fx - assume NWB, though per chart review RUE WB status expired on 10/27/21.       Mobility Bed Mobility Overal bed mobility: Needs Assistance Bed Mobility: Supine to Sit     Supine to sit: Supervision     General bed mobility comments: Supervision to transfer to edge of bed - patient used bed rail to pull herself up in long sitting and then transferred herself to edge of bed    Transfers Overall transfer level: Needs assistance Equipment used: Rolling walker (2 wheels) Transfers: Sit to/from Stand, Bed to chair/wheelchair/BSC Sit to Stand: Min assist     Step pivot transfers: Min assist     General transfer comment: Min assist to power up and min assist for steadying to take steps to recliner.     Balance Overall balance assessment: Needs assistance Sitting-balance support: No upper extremity supported, Feet supported Sitting balance-Leahy Scale: Fair     Standing balance support: Reliant on assistive device for balance Standing balance-Leahy Scale: Poor                             ADL either  performed or assessed with clinical judgement   ADL Overall ADL's : Needs assistance/impaired                     Lower Body Dressing: Sit to/from stand;Maximal  assistance Lower Body Dressing Details (indicate cue type and reason): to don socks at edge of bed                    Extremity/Trunk Assessment Upper Extremity Assessment Upper Extremity Assessment: Generalized weakness   Lower Extremity Assessment Lower Extremity Assessment: Generalized weakness   Cervical / Trunk Assessment Cervical / Trunk Assessment: Normal    Vision Baseline Vision/History: 1 Wears glasses Patient Visual Report: No change from baseline     Perception     Praxis      Cognition Arousal/Alertness: Awake/alert Behavior During Therapy: WFL for tasks assessed/performed Overall Cognitive Status: Within Functional Limits for tasks assessed                                                     Pertinent Vitals/ Pain       Pain Assessment Pain Assessment: 0-10 Pain Score: 7  Pain Location: abdomen Pain Descriptors / Indicators: Discomfort, Grimacing, Guarding Pain Intervention(s): Monitored during session  Home Living                                          Prior Functioning/Environment              Frequency  Min 2X/week        Progress Toward Goals  OT Goals(current goals can now be found in the care plan section)  Progress towards OT goals: Progressing toward goals  Acute Rehab OT Goals Patient Stated Goal: get stronger to go home OT Goal Formulation: With patient Time For Goal Achievement: 12/03/21  Plan Discharge plan remains appropriate    Co-evaluation                 AM-PAC OT "6 Clicks" Daily Activity     Outcome Measure   Help from another person eating meals?: None Help from another person taking care of personal grooming?: A Little Help from another person toileting, which includes using toliet, bedpan, or urinal?: Total Help from another person bathing (including washing, rinsing, drying)?: A Lot Help from another person to put on and taking off regular upper body  clothing?: A Lot Help from another person to put on and taking off regular lower body clothing?: A Lot 6 Click Score: 14    End of Session Equipment Utilized During Treatment: Rolling walker (2 wheels)  OT Visit Diagnosis: Other abnormalities of gait and mobility (R26.89);Muscle weakness (generalized) (M62.81);Other symptoms and signs involving cognitive function   Activity Tolerance Patient limited by pain   Patient Left in chair;with call bell/phone within reach;with chair alarm set;with nursing/sitter in room   Nurse Communication Mobility status        Time: 6283-1517 OT Time Calculation (min): 19 min  Charges: OT General Charges $OT Visit: 1 Visit OT Treatments $Therapeutic Activity: 8-22 mins  Derl Barrow, OTR/L [place]  Office [phone] Pager: [place] 11/25/2021, 2:32 PM

## 2021-11-26 LAB — COMPREHENSIVE METABOLIC PANEL
ALT: 29 U/L (ref 0–44)
AST: 24 U/L (ref 15–41)
Albumin: 1.8 g/dL — ABNORMAL LOW (ref 3.5–5.0)
Alkaline Phosphatase: 130 U/L — ABNORMAL HIGH (ref 38–126)
Anion gap: 6 (ref 5–15)
BUN: 36 mg/dL — ABNORMAL HIGH (ref 6–20)
CO2: 26 mmol/L (ref 22–32)
Calcium: 7.7 mg/dL — ABNORMAL LOW (ref 8.9–10.3)
Chloride: 97 mmol/L — ABNORMAL LOW (ref 98–111)
Creatinine, Ser: 0.95 mg/dL (ref 0.44–1.00)
GFR, Estimated: >60 mL/min (ref >60)
Glucose, Bld: 111 mg/dL — ABNORMAL HIGH (ref 70–99)
Potassium: 4 mmol/L (ref 3.5–5.1)
Sodium: 129 mmol/L — ABNORMAL LOW (ref 135–145)
Total Bilirubin: 0.6 mg/dL (ref 0.3–1.2)
Total Protein: 5.6 g/dL — ABNORMAL LOW (ref 6.5–8.1)

## 2021-11-26 LAB — GLUCOSE, CAPILLARY
Glucose-Capillary: 117 mg/dL — ABNORMAL HIGH (ref 70–99)
Glucose-Capillary: 102 mg/dL — ABNORMAL HIGH (ref 70–99)
Glucose-Capillary: 127 mg/dL — ABNORMAL HIGH (ref 70–99)
Glucose-Capillary: 126 mg/dL — ABNORMAL HIGH (ref 70–99)

## 2021-11-26 LAB — CBC WITH DIFFERENTIAL/PLATELET
Abs Immature Granulocytes: 0.02 10*3/uL (ref 0.00–0.07)
Basophils Absolute: 0 10*3/uL (ref 0.0–0.1)
Basophils Relative: 0 %
Eosinophils Absolute: 0.4 10*3/uL (ref 0.0–0.5)
Eosinophils Relative: 6 %
HCT: 22.6 % — ABNORMAL LOW (ref 36.0–46.0)
Hemoglobin: 7.5 g/dL — ABNORMAL LOW (ref 12.0–15.0)
Immature Granulocytes: 0 %
Lymphocytes Relative: 31 %
Lymphs Abs: 1.8 10*3/uL (ref 0.7–4.0)
MCH: 29.5 pg (ref 26.0–34.0)
MCHC: 33.2 g/dL (ref 30.0–36.0)
MCV: 89 fL (ref 80.0–100.0)
Monocytes Absolute: 0.6 10*3/uL (ref 0.1–1.0)
Monocytes Relative: 11 %
Neutro Abs: 3.1 10*3/uL (ref 1.7–7.7)
Neutrophils Relative %: 52 %
Platelets: 180 10*3/uL (ref 150–400)
RBC: 2.54 MIL/uL — ABNORMAL LOW (ref 3.87–5.11)
RDW: 16.8 % — ABNORMAL HIGH (ref 11.5–15.5)
WBC: 5.9 10*3/uL (ref 4.0–10.5)
nRBC: 0 % (ref 0.0–0.2)

## 2021-11-26 LAB — MAGNESIUM: Magnesium: 2.2 mg/dL (ref 1.7–2.4)

## 2021-11-26 LAB — PHOSPHORUS: Phosphorus: 3.8 mg/dL (ref 2.5–4.6)

## 2021-11-26 MED ORDER — TRACE MINERALS CU-MN-SE-ZN 300-55-60-3000 MCG/ML IV SOLN
INTRAVENOUS | Status: AC
  Filled 2021-11-26: qty 828.8

## 2021-11-26 MED ORDER — FUROSEMIDE 10 MG/ML IJ SOLN
60.0000 mg | Freq: Once | INTRAMUSCULAR | Status: AC
  Administered 2021-11-26: 60 mg via INTRAVENOUS
  Filled 2021-11-26: qty 6

## 2021-11-26 NOTE — Progress Notes (Signed)
PROGRESS NOTE    Jessica [name]  NTI:[id number] DOB: [birth year] DOA: 10/22/2021 PCP: Deland Pretty, MD   Brief Narrative:  61 year old female with history of alcohol abuse, several alcohol withdrawals requiring admissions, tobacco use presented on 10/22/2021 with diffuse abdominal pain with CT of abdomen and pelvis showed pneumoperitoneum and free fluid.  She was started on broad-spectrum antibiotics and emergently taken to the OR and underwent exploratory laparotomy where she was found to have a perforated sigmoid colon.  She is status post Hartmann procedure and insertion of G-tube.  She was found to have Klebsiella bacteremia.  Hospital course complicated by development of left lower quadrant fluid collection status post IR drainage on 10/29/2021, drain cultures grew Pseudomonas.  This required having her drain adjusted and upsized on 510, fluid appeared feculent.  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  She had a repeat CT on 11/10/2021 with pelvic fluid collection measuring 6 x 3.2 cm.  This was discussed with IR and it was not felt safe or amenable to drainage.  She was continued on IV antibiotics.  ID was consulted.  Repeat CT scan showed stable pelvic abscess also remaining unable to be drained.  She is currently on TPN.  Palliative care was also consulted for goals of care discussion.  Currently remains full code.  Assessment & Plan:   Complicated intra-abdominal infection Perforated sigmoid colon status post expiratory laparotomy/Hartman's procedure/G-tube placement Septic shock: Present on admission, resolved Klebsiella bacteremia [facility] course as above. -As per recent ID recommendations, patient has been on IV Zosyn: Undetermined prolonged period given uncontrolled intra-abdominal infection with abscess not amenable to drainage. -General surgery following.  Continue TPN. Low-grade temperatures recently.  Follow repeat blood  cultures from 11/23/2021: Negative so far.  Follow general surgery recommendations.  Wound care as per general surgery and wound care nurse recommendations. General surgery repeated CT of the abdomen on 11/23/2021 which showed decreasing collections but findings worrisome for small bowel ileus versus partial obstruction. -Septic shock has already resolved.  Off pressors  Acute respiratory failure with hypoxia/aspiration pneumonia -Has required intubation and extubation twice.  Extubated again on 11/08/2021.  Currently on room air.  Acute metabolic encephalopathy -Multifactorial due to ICU delirium/infectious etiology/alcohol withdrawal -Mental status has mostly improved.  Monitor mental status. -Fall precautions  AKI -Resolved.  Essential hypertension -Monitor blood pressure.  Continue metoprolol and hydralazine.  Anemia of chronic disease -Has required 4 units packed red cells transfusion during the hospitalization with last transfusion being on 11/24/2021 for hemoglobin of 6.9. -Hemoglobin 7.5 today.  Monitor H&H.  Hyponatremia -Sodium improving today to 129; received a dose of IV Lasix on 11/25/2021.  We will give another dose of IV Lasix.  Currently on TPN.  Monitor.  Continue salt tablets.  Monitor.  Severe protein calorie malnutrition/hypoalbuminemia Anorexia -Due to poor oral intake.  Continue TPN.  Nutrition following. -Currently on Megace as well  Hypomagnesemia -Resolved  Substance-induced mood disorder/anxiety -Continue lorazepam as needed.  Physical deconditioning -Will need SNF placement  DVT prophylaxis: Heparin subcutaneous Code Status: Full Family Communication: None at bedside Disposition Plan: Status is: Inpatient Remains inpatient appropriate because: Of severity of illness    Consultants: General surgery/palliative care/ID/PCCM/IR  Procedures: As above  Antimicrobials:  Anti-infectives (From admission, onward)    Start     Dose/Rate Route Frequency  Ordered Stop   11/14/21 0800  piperacillin-tazobactam (ZOSYN) IVPB 3.375 g        3.375 g  12.5 mL/hr over 240 Minutes Intravenous Every 8 hours 11/14/21 0739     11/04/21 1000  piperacillin-tazobactam (ZOSYN) IVPB 3.375 g        3.375 g 12.5 mL/hr over 240 Minutes Intravenous Every 8 hours 11/04/21 0935 11/11/21 1312   10/27/21 1600  cefTRIAXone (ROCEPHIN) 2 g in sodium chloride 0.9 % 100 mL IVPB  Status:  Discontinued        2 g 200 mL/hr over 30 Minutes Intravenous Every 24 hours 10/27/21 0843 10/27/21 1106   10/27/21 1600  piperacillin-tazobactam (ZOSYN) IVPB 3.375 g        3.375 g 12.5 mL/hr over 240 Minutes Intravenous Every 8 hours 10/27/21 1106 11/03/21 2000   10/27/21 1400  metroNIDAZOLE (FLAGYL) IVPB 500 mg  Status:  Discontinued        500 mg 100 mL/hr over 60 Minutes Intravenous Every 12 hours 10/27/21 0843 10/27/21 1106   10/24/21 1600  piperacillin-tazobactam (ZOSYN) IVPB 3.375 g  Status:  Discontinued        3.375 g 12.5 mL/hr over 240 Minutes Intravenous Every 8 hours 10/24/21 1422 10/27/21 0843   10/24/21 1000  cefTRIAXone (ROCEPHIN) 2 g in sodium chloride 0.9 % 100 mL IVPB  Status:  Discontinued        2 g 200 mL/hr over 30 Minutes Intravenous Every 24 hours 10/24/21 0747 10/24/21 1357   10/23/21 1530  vancomycin (VANCOCIN) IVPB 1000 mg/200 mL premix        1,000 mg 200 mL/hr over 60 Minutes Intravenous  Once 10/23/21 1439 10/23/21 1547   10/23/21 0200  ceFEPIme (MAXIPIME) 2 g in sodium chloride 0.9 % 100 mL IVPB  Status:  Discontinued        2 g 200 mL/hr over 30 Minutes Intravenous Every 12 hours 10/22/21 2032 10/24/21 0747   10/22/21 2200  cefOXitin (MEFOXIN) 2 g in sodium chloride 0.9 % 100 mL IVPB  Status:  Discontinued        2 g 200 mL/hr over 30 Minutes Intravenous Every 8 hours 10/22/21 1706 10/22/21 2032   10/22/21 1956  vancomycin variable dose per unstable renal function (pharmacist dosing)  Status:  Discontinued         Does not apply See admin  instructions 10/22/21 1957 10/23/21 1917   10/22/21 1245  vancomycin (VANCOCIN) IVPB 1000 mg/200 mL premix        1,000 mg 200 mL/hr over 60 Minutes Intravenous  Once 10/22/21 1241 10/22/21 1402   10/22/21 1245  ceFEPIme (MAXIPIME) 2 g in sodium chloride 0.9 % 100 mL IVPB        2 g 200 mL/hr over 30 Minutes Intravenous  Once 10/22/21 1241 10/22/21 1321   10/22/21 1230  metroNIDAZOLE (FLAGYL) IVPB 500 mg  Status:  Discontinued        500 mg 100 mL/hr over 60 Minutes Intravenous Every 12 hours 10/22/21 1222 10/24/21 1357        Subjective: Patient seen and examined at bedside.  Poor historian.  Denies any worsening abdominal pain, vomiting, chest pain or shortness of breath.   Objective: Vitals:   11/25/21 0602 11/25/21 0635 11/25/21 1500 11/26/21 0632  BP:   137/78   Pulse:    (!) 106  Resp:   18   Temp: (!) 100.6 F (38.1 C) 99.5 F (37.5 C)  98.5 F (36.9 C)  TempSrc:  Oral  Oral  SpO2:  100% 98% 97%  Weight:      Height:  Intake/Output Summary (Last 24 hours) at 11/26/2021 0754 Last data filed at 11/26/2021 0634 Gross per 24 hour  Intake 3066.53 ml  Output 5400 ml  Net -2333.47 ml    Filed Weights   11/18/21 0500 11/19/21 0330 11/21/21 0500  Weight: 55.3 kg 59.9 kg 60.8 kg    Examination:  General: Still on room air.  Chronically ill and deconditioned looking.  No distress ENT/neck: No palpable thyromegaly.  No JVD elevation  respiratory: Decreased breath sounds at bases bilaterally with scattered crackles; no tachypnea noted  CVS: Mild intermittent tachycardia present; S1 and S2 are heard  abdominal: Soft, mildly tender diffusely; slightly distended; no organomegaly, normal bowel sounds are heard.  Ostomy and Eakin's pouch present Extremities: Mild lower extremity edema; no clubbing CNS: Alert.  No focal neurologic deficit.  She is able to move extremities  lymph: No palpable lymphadenopathy noted Skin: No obvious rashes/petechiae psych: Flat affect.   Currently not agitated  musculoskeletal: No obvious joint swelling/deformity       Data Reviewed: I have personally reviewed following labs and imaging studies  CBC: Recent Labs  Lab 11/22/21 0536 11/23/21 0329 11/24/21 0323 11/25/21 0040 11/25/21 0046 11/26/21 0341  WBC 7.1 6.8 6.8 7.0  --  5.9  NEUTROABS 4.2 3.5 3.8 4.0  --  3.1  HGB 7.6* 7.3* 6.9* 8.0* 8.1* 7.5*  HCT 23.2* 22.5* 21.3* 24.5* 24.1* 22.6*  MCV 89.2 90.7 90.6 89.7  --  89.0  PLT 196 188 183 181  --  017    Basic Metabolic Panel: Recent Labs  Lab 11/22/21 0536 11/23/21 0329 11/24/21 0323 11/25/21 0040 11/26/21 0341  NA 131* 129* 130* 125* 129*  K 3.5 4.0 4.5 4.5 4.0  CL 99 101 102 98 97*  CO2 '26 22 22 22 26  '$ GLUCOSE 128* 110* 115* 118* 111*  BUN 33* 33* 30* 31* 36*  CREATININE 0.75 0.83 0.86 0.78 0.95  CALCIUM 7.6* 7.4* 7.5* 7.5* 7.7*  MG 2.1 2.0 1.8 1.7 2.2  PHOS 3.4 3.5 3.9 3.6 3.8    GFR: Estimated Creatinine Clearance: 60.4 mL/min (by C-G formula based on SCr of 0.95 mg/dL). Liver Function Tests: Recent Labs  Lab 11/22/21 0536 11/23/21 0329 11/24/21 0323 11/25/21 0040 11/26/21 0341  AST '18 16 17 25 24  '$ ALT '21 20 21 27 29  '$ ALKPHOS 90 79 81 115 130*  BILITOT 0.6 0.5 0.6 0.5 0.6  PROT 5.6* 5.7* 5.5* 5.9* 5.6*  ALBUMIN 1.8* 1.8* 1.7* 1.9* 1.8*    No results for input(s): LIPASE, AMYLASE in the last 168 hours. No results for input(s): AMMONIA in the last 168 hours. Coagulation Profile: No results for input(s): INR, PROTIME in the last 168 hours. Cardiac Enzymes: No results for input(s): CKTOTAL, CKMB, CKMBINDEX, TROPONINI in the last 168 hours. BNP (last 3 results) No results for input(s): PROBNP in the last 8760 hours. HbA1C: No results for input(s): HGBA1C in the last 72 hours. CBG: Recent Labs  Lab 11/24/21 2359 11/25/21 0625 11/25/21 1146 11/25/21 1803 11/26/21 0627  GLUCAP 131* 123* 139* 109* 117*    Lipid Profile: Recent Labs    11/24/21 0323  TRIG 69     Thyroid Function Tests: No results for input(s): TSH, T4TOTAL, FREET4, T3FREE, THYROIDAB in the last 72 hours. Anemia Panel: No results for input(s): VITAMINB12, FOLATE, FERRITIN, TIBC, IRON, RETICCTPCT in the last 72 hours. Sepsis Labs: No results for input(s): PROCALCITON, LATICACIDVEN in the last 168 hours.  Recent Results (from the past 240 hour(s))  Culture, blood (Routine X 2) w Reflex to ID Panel     Status: None (Preliminary result)   Collection Time: 11/23/21 10:51 AM   Specimen: BLOOD  Result Value Ref Range Status   Specimen Description   Final    BLOOD RIGHT ANTECUBITAL Performed at [facility] [address] [place], [place] [address]    Special Requests   Final    IN PEDIATRIC BOTTLE Blood Culture adequate volume Performed at [facility] [address], [place], [place] [address]    Culture   Final    NO GROWTH 2 DAYS Performed at [facility] [address] [place], [place] [address]    Report Status PENDING  Incomplete  Culture, blood (Routine X 2) w Reflex to ID Panel     Status: None (Preliminary result)   Collection Time: 11/23/21 11:48 AM   Specimen: BLOOD  Result Value Ref Range Status   Specimen Description   Final    BLOOD BLOOD RIGHT FOREARM Performed at [facility] [address] [place], [place] [address]    Special Requests   Final    BOTTLES DRAWN AEROBIC ONLY Blood Culture adequate volume Performed at [facility] [address], [place], [place] [address]    Culture   Final    NO GROWTH 2 DAYS Performed at [facility] [address] [place],  [address]    Report Status PENDING  Incomplete          Radiology Studies: No results found.      Scheduled Meds:  sodium chloride   Intravenous Once   vitamin C  500 mg Per Tube BID   chlorhexidine  15 mL Mouth Rinse BID   Chlorhexidine Gluconate Cloth  6 each Topical Daily    feeding supplement  237 mL Oral BID BM   heparin injection (subcutaneous)  5,000 Units Subcutaneous Q8H   mouth rinse  15 mL Mouth Rinse q12n4p   megestrol  400 mg Oral BID   metoprolol tartrate  50 mg Oral BID   nicotine  14 mg Transdermal Daily   pantoprazole (PROTONIX) IV  40 mg Intravenous Q24H   sodium chloride flush  10-40 mL Intracatheter Q12H   sodium chloride flush  5 mL Intracatheter Q12H   sodium chloride flush  5 mL Intracatheter Q8H   sodium chloride  1 g Oral BID WC   vitamin A  10,000 Units Per Tube Daily   Continuous Infusions:  sodium chloride 250 mL (11/22/21 0019)   piperacillin-tazobactam (ZOSYN)  IV 3.375 g (11/26/21 0007)   TPN ADULT (ION) 95 mL/hr at 11/26/21 0500          Aline August, MD [facility] 11/26/2021, 7:54 AM

## 2021-11-26 NOTE — Progress Notes (Signed)
Physical Therapy Treatment Patient Details Name: Jessica [name] MRN: [id number] DOB: August 28, 1960 Today's Date: 11/26/2021   History of Present Illness Jessica [name] is an 61 y.o. female past medical history significant for alcohol abuse, with admissions for severe alcohol withdrawals requiring ICU admissions tobacco use presented to the ED on 10/22/2021 diffuse abdominal pain that started the day prior to admission.  CT of the abdomen pelvis showed pneumoperitoneum and free fluid, severe sepsis with started on broad-spectrum antibiotics fluid resuscitated surgery was consulted was taken emergently to the OR underwent exploratory laparotomy and was found to have a perforated sigmoid colon, status post Hartmann procedure and insertion of the G-tube which was left to drain to gravity, JP in pelvis was noted to have feculent peritonitis in the pelvis.  ICU course was complicated by Klebsiella bacteremia profound caloric protein malnutrition requiring TPN, acute respiratory failure with hypoxia and tachycardia with failure to progress    PT Comments    General Comments: AxO x 3 increased motivation.  Pt wants to go home and NOT a "Nursing Home".  Stated her Boy Friend "will be there to help me". Assisted OOB to amb in hallway using B platform EVA walker and recliner following for safety and pt anxiety.  General bed mobility comments: Supervision to transfer to edge of bed - patient used bed rail to pull herself up in long sitting and then transferred herself to edge of bed.  General transfer comment: self able to rise from elevated bed with increased time.  General Gait Details: using B Platform EVA walker and + 2 assist with Sig Other following with recliner, pt was able to amb 95 feet but with much effort and lean on walker plarforms.    Tolerated well.  Assisted back to bed.  Need to advance her to a regular walker as pt wants to D/C to home but she gets nervous and loves "that special walker".  Pt  progressing with her mobility slowly.    Recommendations for follow up therapy are one component of a multi-disciplinary discharge planning process, led by the attending physician.  Recommendations may be updated based on patient status, additional functional criteria and insurance authorization.  Follow Up Recommendations  Skilled nursing-short term rehab (<3 hours/day) (pt hopes to D/C to home)     Assistance Recommended at Discharge Frequent or constant Supervision/Assistance  Patient can return home with the following Two people to help with walking and/or transfers;A lot of help with bathing/dressing/bathroom;Assistance with cooking/housework;Assist for transportation;Help with stairs or ramp for entrance   Equipment Recommendations  None recommended by PT    Recommendations for Other Services       Precautions / Restrictions Precautions Precautions: Fall Precaution Comments: multiple lines--> L JP drain, gastrostomy tube, triple lumen central line, colostomy Restrictions Weight Bearing Restrictions: No Other Position/Activity Restrictions: recent R UE/shoulder fx - assume NWB, though per chart review RUE WB status expired on 10/27/21.     Mobility  Bed Mobility Overal bed mobility: Needs Assistance Bed Mobility: Supine to Sit Rolling: Supervision   Supine to sit: Supervision     General bed mobility comments: Supervision to transfer to edge of bed - patient used bed rail to pull herself up in long sitting and then transferred herself to edge of bed    Transfers Overall transfer level: Needs assistance Equipment used: Rolling walker (2 wheels) Transfers: Sit to/from Stand Sit to Stand: Min guard, Min assist           General  transfer comment: self able to rise from elevated bed with increased time    Ambulation/Gait Ambulation/Gait assistance: Min assist, +2 safety/equipment Gait Distance (Feet): 95 Feet Assistive device: Bilateral platform walker Gait  Pattern/deviations: Step-to pattern, Step-through pattern, Decreased step length - right, Decreased step length - left, Decreased stance time - right, Trunk flexed Gait velocity: decreased     General Gait Details: using B Platform EVA walker and + 2 assist with Sig Other following with recliner, pt was able to amb 95 feet but with much effort and lean on walker plarforms.    Tolerated well.  Assisted back to bed.  Need to advance her to a regular walker but she gets nervous.   Stairs             Wheelchair Mobility    Modified Rankin (Stroke Patients Only)       Balance                                            Cognition Arousal/Alertness: Awake/alert Behavior During Therapy: WFL for tasks assessed/performed Overall Cognitive Status: Within Functional Limits for tasks assessed                                 General Comments: AxO x 3 increased motivation.  Pt wants to go home and NOT a "Nursing Home".  Stated her Boy Friend "will be there to help me".        Exercises      General Comments        Pertinent Vitals/Pain Pain Assessment Pain Assessment: Faces Faces Pain Scale: Hurts little more Pain Location: abdomen Pain Descriptors / Indicators: Discomfort, Grimacing, Guarding Pain Intervention(s): Monitored during session, Repositioned    Home Living                          Prior Function            PT Goals (current goals can now be found in the care plan section) Progress towards PT goals: Progressing toward goals    Frequency    Min 2X/week      PT Plan Current plan remains appropriate    Co-evaluation              AM-PAC PT "6 Clicks" Mobility   Outcome Measure  Help needed turning from your back to your side while in a flat bed without using bedrails?: A Little Help needed moving from lying on your back to sitting on the side of a flat bed without using bedrails?: A Little Help needed  moving to and from a bed to a chair (including a wheelchair)?: A Little Help needed standing up from a chair using your arms (e.g., wheelchair or bedside chair)?: A Little Help needed to walk in hospital room?: A Lot Help needed climbing 3-5 steps with a railing? : Total 6 Click Score: 15    End of Session Equipment Utilized During Treatment: Gait belt Activity Tolerance: Patient tolerated treatment well Patient left: in bed;with bed alarm set;with call bell/phone within reach Nurse Communication: Mobility status PT Visit Diagnosis: Muscle weakness (generalized) (M62.81);History of falling (Z91.81);Difficulty in walking, not elsewhere classified (R26.2);Pain Pain - Right/Left: Right Pain - part of body: Shoulder     Time: 8299-3716 PT  Time Calculation (min) (ACUTE ONLY): 23 min  Charges:  $Gait Training: 8-22 mins $Therapeutic Activity: 8-22 mins                     Rica Koyanagi  PTA Acute  Rehabilitation Services Pager      [phone] Office      [phone]

## 2021-11-26 NOTE — Progress Notes (Signed)
Palliative care brief note  I ran into patient's significant other, Jeneen Rinks, in the hall today.  Discussed that Ms. Pallo is showing slow improvement but she remains invested in continuation of any offered interventions.  He relayed to me that he has a friend who is a physician and she was hoping to speak with somebody from surgery team about Ms. Hetz's care.  I let him know that I did see that this contact information is in the chart, but it would likely be next week before anyone from the surgery team has a chance to reach out to her due to volume of patient care commitments over the weekend.  He stated this would be fine but wanted to make sure the information was passed along.  Micheline Rough, MD [facility] [phone]  NO CHARGE NOTE

## 2021-11-26 NOTE — Progress Notes (Signed)
35 Days Post-Op   Subjective/Chief Complaint: Waiting for breakfast    Objective: Vital signs in last 24 hours: Temp:  [98.5 F (36.9 C)] 98.5 F (36.9 C) (05/20 4097) Pulse Rate:  [106] 106 (05/20 0632) Resp:  [18] 18 (05/19 1500) BP: (137)/(78) 137/78 (05/19 1500) SpO2:  [97 %-98 %] 97 % (05/20 3532) Last BM Date : 11/25/21  Intake/Output from previous day: 05/19 0701 - 05/20 0700 In: 3066.5 [P.O.:360; I.V.:2564; IV Piggyback:142.5] Out: 5400 [Urine:5100; Drains:200; Stool:100] Intake/Output this shift: Total I/O In: 120 [P.O.:120] Out: 400 [Urine:400]   General appearance: alert and cooperative Resp: clear to auscultation bilaterally Cardio: regular rate and rhythm GI: soft, appropriately tender. Ostomy and eakins pouch intact draining feculent effluent, IR drain w/ feculent drainage.  Ext: mild pitting edema of the feet and ankles. Lab Results:  Recent Labs    11/25/21 0040 11/25/21 0046 11/26/21 0341  WBC 7.0  --  5.9  HGB 8.0* 8.1* 7.5*  HCT 24.5* 24.1* 22.6*  PLT 181  --  180   BMET Recent Labs    11/25/21 0040 11/26/21 0341  NA 125* 129*  K 4.5 4.0  CL 98 97*  CO2 22 26  GLUCOSE 118* 111*  BUN 31* 36*  CREATININE 0.78 0.95  CALCIUM 7.5* 7.7*   PT/INR No results for input(s): LABPROT, INR in the last 72 hours. ABG No results for input(s): PHART, HCO3 in the last 72 hours.  Invalid input(s): PCO2, PO2  Studies/Results: No results found.  Anti-infectives: Anti-infectives (From admission, onward)    Start     Dose/Rate Route Frequency Ordered Stop   11/14/21 0800  piperacillin-tazobactam (ZOSYN) IVPB 3.375 g        3.375 g 12.5 mL/hr over 240 Minutes Intravenous Every 8 hours 11/14/21 0739     11/04/21 1000  piperacillin-tazobactam (ZOSYN) IVPB 3.375 g        3.375 g 12.5 mL/hr over 240 Minutes Intravenous Every 8 hours 11/04/21 0935 11/11/21 1312   10/27/21 1600  cefTRIAXone (ROCEPHIN) 2 g in sodium chloride 0.9 % 100 mL IVPB  Status:   Discontinued        2 g 200 mL/hr over 30 Minutes Intravenous Every 24 hours 10/27/21 0843 10/27/21 1106   10/27/21 1600  piperacillin-tazobactam (ZOSYN) IVPB 3.375 g        3.375 g 12.5 mL/hr over 240 Minutes Intravenous Every 8 hours 10/27/21 1106 11/03/21 2000   10/27/21 1400  metroNIDAZOLE (FLAGYL) IVPB 500 mg  Status:  Discontinued        500 mg 100 mL/hr over 60 Minutes Intravenous Every 12 hours 10/27/21 0843 10/27/21 1106   10/24/21 1600  piperacillin-tazobactam (ZOSYN) IVPB 3.375 g  Status:  Discontinued        3.375 g 12.5 mL/hr over 240 Minutes Intravenous Every 8 hours 10/24/21 1422 10/27/21 0843   10/24/21 1000  cefTRIAXone (ROCEPHIN) 2 g in sodium chloride 0.9 % 100 mL IVPB  Status:  Discontinued        2 g 200 mL/hr over 30 Minutes Intravenous Every 24 hours 10/24/21 0747 10/24/21 1357   10/23/21 1530  vancomycin (VANCOCIN) IVPB 1000 mg/200 mL premix        1,000 mg 200 mL/hr over 60 Minutes Intravenous  Once 10/23/21 1439 10/23/21 1547   10/23/21 0200  ceFEPIme (MAXIPIME) 2 g in sodium chloride 0.9 % 100 mL IVPB  Status:  Discontinued        2 g 200 mL/hr over 30 Minutes  Intravenous Every 12 hours 10/22/21 2032 10/24/21 0747   10/22/21 2200  cefOXitin (MEFOXIN) 2 g in sodium chloride 0.9 % 100 mL IVPB  Status:  Discontinued        2 g 200 mL/hr over 30 Minutes Intravenous Every 8 hours 10/22/21 1706 10/22/21 2032   10/22/21 1956  vancomycin variable dose per unstable renal function (pharmacist dosing)  Status:  Discontinued         Does not apply See admin instructions 10/22/21 1957 10/23/21 1917   10/22/21 1245  vancomycin (VANCOCIN) IVPB 1000 mg/200 mL premix        1,000 mg 200 mL/hr over 60 Minutes Intravenous  Once 10/22/21 1241 10/22/21 1402   10/22/21 1245  ceFEPIme (MAXIPIME) 2 g in sodium chloride 0.9 % 100 mL IVPB        2 g 200 mL/hr over 30 Minutes Intravenous  Once 10/22/21 1241 10/22/21 1321   10/22/21 1230  metroNIDAZOLE (FLAGYL) IVPB 500 mg  Status:   Discontinued        500 mg 100 mL/hr over 60 Minutes Intravenous Every 12 hours 10/22/21 1222 10/24/21 1357       Assessment/Plan: s/p Procedure(s): EXPLORATORY LAPAROTOMY hartmans (N/A) POD#34 - PERFORATED SIGMOID COLON - status post EX LAP, HARTMAN'S PROCEDURE, Gastrostomy tube placement - 2/54/2706 Dr. 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 WOCN following for colostomy and pouching of fistula - CT 5/4 and 5/11 with pelvic fluid collection measuring 6 x 3.2 cm. Discussed with IR and not felt amenable/safe for drainage. -  G-tube clamped 5/7. Patient tolerating. Tolerating ice chips/sips. SLP following  - CT A/P repeated 5/17 due to low grade fever and tachycardia - shows interval decrease in intra abdominal fluid collections. Pleural effusion. Generalized body wall edema.    FEN - G-tube capped, TPN, diet SOFT starting ; prealbumin 18.2 on 5/13 VTE - SCDs, SQH ID - zosyn 4/17 - 4/27; 4/28-5/6, 5/8 >>   Continue IV abx - do not have source control at this time, pelvic abscess not amenable to drainage but are improving. Diuresis per medical service.     continue diet per SLP and monitor drain/fistula output.  Overall , stable   LOS: 35 days    Turner Daniels MD  11/26/2021

## 2021-11-26 NOTE — Progress Notes (Signed)
PHARMACY - TOTAL PARENTERAL NUTRITION CONSULT NOTE   Indication: Prolonged ileus  Patient Measurements: Height: '5\' 10"'  (177.8 cm) Weight: 60.8 kg (134 lb 0.6 oz) IBW/kg (Calculated) : 68.5 TPN AdjBW (KG): 53.8 Body mass index is 19.23 kg/m. Usual Weight: 53.8 kg  Assessment: 61 yo F w/ PMH etoh use disorder, smoker, HTN, GERD. S/p Ex Lap Hartmans 10/22/2021 for perforated sigmoid colon.  Glucose / Insulin: no hx DM; CBGs continue to be well controlled after stopping SSI Electrolytes: Na remains low on max Na in TPN (likely more to due with water excess than true Na defecit - ordered Lasix today); Cl borderline low; others WNL Vitamins:   - Vitamin C <0.1 (4/22); receiving 200 mg ascorbic acid daily in TPN from [facility] - Vitamin A 14.2 (4/22); receiving 1 mg of vitamin A daily in TPN from [facility] - Unable to add additional ascorbic acid and vitamin A to TPN per TPN policy - 5/5 - initiated ascorbic acid 500 mg per tube BID and vitamin A 10,000 units per tube daily. Tube to be clamped x1 hour after administration then returned to suction.  Renal: SCr WNL; BUN 31; UOP robust after 60 mg Lasix x 1 Hepatic: LFTs, Tbili, Alk Phos borderline elevated; TG stable WNL.  - Albumin remains low - prealbumin 18.2 on 5/13  Intake / Output: charted as +7L this admit; diuresing yesterday and again today with lasix d/t overload - drain OP bumped up 5/18 but now decreasing again - Stool OP low but improving - no MIVF GI Imaging:  - 4/15 CTA: bowel perforation - 4/17 KUB ileus or pSBO - 4/20 CTA: severe ileus or pSBO - 5/04 CT: new pelvic collection not amenable to drainage, likely an abscess - 5/11 CTa/p: fluid collections unchanged from 5/4 - 5/17 abd CT: Central pelvic enhancing fluid collection has decreased in size. Diffusely dilated small bowel containing contrast worrisome for small bowel ileus or partial distal small bowel obstruction. Increasing bilateral pleural effusions. GI Surgeries /  Procedures:  - 10/22/21 s/p EXPLORATORY LAPAROTOMY hartmans for perforated sigmoid colon - 4/23 IR placed drain LLQ - 5/10 Drainage cath exchanged  Central access: CVC TL placed 10/23/21 TPN start date: 10/26/21  Nutritional Goals: - goal for ileus: Mag > 2, K > 4, phos ~3 RD Assessment: (5/10) Estimated Needs Total Energy Estimated Needs: 2000-2200 kcal Total Protein Estimated Needs: 105-125 grams Total Fluid Estimated Needs: >/= 2.2 L/day  Current Nutrition:  Soft diet, minimal intake (< 50%) Refusing all supplements Goal TPN rate is 95 mL/hr (provides 109 g of protein and 2170 kcals per day)   Plan:  Given fluid overload requiring diuresis, will concentrate TPN as able.  Clinisol already in use per TPN pharmacist, so will take advantage of this as well  At 18:00:   New TPN with clinisol at 70 mL/hr will provide 124 g protein & 2040 kcal daily (100% of goals) Electrolytes in TPN: increased Na, K, Ca to maintain previous amounts after volume reduction (d/t compatibility/stability concerns, unable to increase Mg to match prior amount) Na 154 mEq/L K 55 mEq/L Ca 4 mEq/L Mg 10 mEq/L Phos 2 mmol/L Cl:Ac 2:1 Continue standard [facility] and trace elements to TPN Continue folic acid & thiamine in TPN Monitor TPN labs on Mon/Thurs CMET daily thru 5/21 and  phos/mag ordered as daily by MD Follow up oral intake for appropriate wean off TPN    Agata Lucente A, PharmD, BCPS Clinical Pharmacist 11/26/2021 10:01 AM

## 2021-11-26 NOTE — Progress Notes (Signed)
Information from [place] to [facility] placed on treatment team sticky note.

## 2021-11-27 LAB — COMPREHENSIVE METABOLIC PANEL
ALT: 30 U/L (ref 0–44)
AST: 23 U/L (ref 15–41)
Albumin: 1.9 g/dL — ABNORMAL LOW (ref 3.5–5.0)
Alkaline Phosphatase: 130 U/L — ABNORMAL HIGH (ref 38–126)
Anion gap: 7 (ref 5–15)
BUN: 38 mg/dL — ABNORMAL HIGH (ref 6–20)
CO2: 24 mmol/L (ref 22–32)
Calcium: 8.1 mg/dL — ABNORMAL LOW (ref 8.9–10.3)
Chloride: 100 mmol/L (ref 98–111)
Creatinine, Ser: 0.81 mg/dL (ref 0.44–1.00)
GFR, Estimated: >60 mL/min (ref >60)
Glucose, Bld: 115 mg/dL — ABNORMAL HIGH (ref 70–99)
Potassium: 4.1 mmol/L (ref 3.5–5.1)
Sodium: 131 mmol/L — ABNORMAL LOW (ref 135–145)
Total Bilirubin: 0.7 mg/dL (ref 0.3–1.2)
Total Protein: 6.2 g/dL — ABNORMAL LOW (ref 6.5–8.1)

## 2021-11-27 LAB — CBC WITH DIFFERENTIAL/PLATELET
Abs Immature Granulocytes: 0.02 10*3/uL (ref 0.00–0.07)
Basophils Absolute: 0 10*3/uL (ref 0.0–0.1)
Basophils Relative: 0 %
Eosinophils Absolute: 0.3 10*3/uL (ref 0.0–0.5)
Eosinophils Relative: 4 %
HCT: 23.3 % — ABNORMAL LOW (ref 36.0–46.0)
Hemoglobin: 7.9 g/dL — ABNORMAL LOW (ref 12.0–15.0)
Immature Granulocytes: 0 %
Lymphocytes Relative: 27 %
Lymphs Abs: 1.8 10*3/uL (ref 0.7–4.0)
MCH: 30 pg (ref 26.0–34.0)
MCHC: 33.9 g/dL (ref 30.0–36.0)
MCV: 88.6 fL (ref 80.0–100.0)
Monocytes Absolute: 0.8 10*3/uL (ref 0.1–1.0)
Monocytes Relative: 11 %
Neutro Abs: 4 10*3/uL (ref 1.7–7.7)
Neutrophils Relative %: 58 %
Platelets: 186 10*3/uL (ref 150–400)
RBC: 2.63 MIL/uL — ABNORMAL LOW (ref 3.87–5.11)
RDW: 17 % — ABNORMAL HIGH (ref 11.5–15.5)
WBC: 6.9 10*3/uL (ref 4.0–10.5)
nRBC: 0 % (ref 0.0–0.2)

## 2021-11-27 LAB — MAGNESIUM: Magnesium: 1.9 mg/dL (ref 1.7–2.4)

## 2021-11-27 LAB — PHOSPHORUS: Phosphorus: 4.2 mg/dL (ref 2.5–4.6)

## 2021-11-27 LAB — GLUCOSE, CAPILLARY
Glucose-Capillary: 122 mg/dL — ABNORMAL HIGH (ref 70–99)
Glucose-Capillary: 119 mg/dL — ABNORMAL HIGH (ref 70–99)

## 2021-11-27 MED ORDER — TRACE MINERALS CU-MN-SE-ZN 300-55-60-3000 MCG/ML IV SOLN
INTRAVENOUS | Status: AC
  Filled 2021-11-27: qty 828.8

## 2021-11-27 MED ORDER — FUROSEMIDE 10 MG/ML IJ SOLN
60.0000 mg | Freq: Once | INTRAMUSCULAR | Status: AC
  Administered 2021-11-27: 60 mg via INTRAVENOUS
  Filled 2021-11-27: qty 6

## 2021-11-27 MED ORDER — ENOXAPARIN SODIUM 40 MG/0.4ML IJ SOSY
40.0000 mg | PREFILLED_SYRINGE | INTRAMUSCULAR | Status: DC
  Administered 2021-11-27 – 2021-12-05 (×9): 40 mg via SUBCUTANEOUS
  Filled 2021-11-27 (×9): qty 0.4

## 2021-11-27 NOTE — Progress Notes (Signed)
PROGRESS NOTE    Jessica [name]  WHQ:[id number] DOB: 1961-01-29 DOA: 10/22/2021 PCP: Deland Pretty, MD   Brief Narrative:  61 year old female with history of alcohol abuse, several alcohol withdrawals requiring admissions, tobacco use presented on 10/22/2021 with diffuse abdominal pain with CT of abdomen and pelvis showed pneumoperitoneum and free fluid.  She was started on broad-spectrum antibiotics and emergently taken to the OR and underwent exploratory laparotomy where she was found to have a perforated sigmoid colon.  She is status post Hartmann procedure and insertion of G-tube.  She was found to have Klebsiella bacteremia.  Hospital course complicated by development of left lower quadrant fluid collection status post IR drainage on 10/29/2021, drain cultures grew Pseudomonas.  This required having her drain adjusted and upsized on 510, fluid appeared feculent.  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  She had a repeat CT on 11/10/2021 with pelvic fluid collection measuring 6 x 3.2 cm.  This was discussed with IR and it was not felt safe or amenable to drainage.  She was continued on IV antibiotics.  ID was consulted.  Repeat CT scan showed stable pelvic abscess also remaining unable to be drained.  She is currently on TPN.  Palliative care was also consulted for goals of care discussion.  Currently remains full code.  Assessment & Plan:   Complicated intra-abdominal infection Perforated sigmoid colon status post expiratory laparotomy/Hartman's procedure/G-tube placement Septic shock: Present on admission, resolved Klebsiella bacteremia [facility] course as above. -As per recent ID recommendations, patient has been on IV Zosyn: Undetermined prolonged period given uncontrolled intra-abdominal infection with abscess not amenable to drainage. -General surgery following.  Continue TPN.  -Repeat blood cultures from 11/23/2021: Negative so far.   Wound care as per general surgery and wound care nurse recommendations. General surgery repeated CT of the abdomen on 11/23/2021 which showed decreasing collections  -Septic shock has already resolved.  Off pressors -Currently having low-grade temperatures but no temperature spikes.  Acute respiratory failure with hypoxia/aspiration pneumonia -Has required intubation and extubation twice.  Extubated again on 11/08/2021.  Currently on room air.  Acute metabolic encephalopathy -Multifactorial due to ICU delirium/infectious etiology/alcohol withdrawal -Mental status has mostly improved.  Monitor mental status. -Fall precautions  AKI -Resolved.  Essential hypertension -Monitor blood pressure.  Continue metoprolol and hydralazine.  Anemia of chronic disease -Has required 4 units packed red cells transfusion during the hospitalization with last transfusion being on 11/24/2021 for hemoglobin of 6.9. -Hemoglobin 7.9 today.  Monitor H&H.  Hyponatremia -Sodium improving today to 131; has received 2 doses of IV Lasix since 11/25/2021.  We will give another dose of IV Lasix.  Currently on TPN.  Monitor.  Continue salt tablets.  Monitor.  Severe protein calorie malnutrition/hypoalbuminemia Anorexia -Due to poor oral intake.  Continue TPN.  Nutrition following. -Currently on Megace as well  Hypomagnesemia -Resolved  Substance-induced mood disorder/anxiety -Continue lorazepam as needed.  Physical deconditioning -Will need SNF placement  DVT prophylaxis: Heparin subcutaneous Code Status: Full Family Communication: None at bedside Disposition Plan: Status is: Inpatient Remains inpatient appropriate because: Of severity of illness    Consultants: General surgery/palliative care/ID/PCCM/IR  Procedures: As above  Antimicrobials:  Anti-infectives (From admission, onward)    Start     Dose/Rate Route Frequency Ordered Stop   11/14/21 0800  piperacillin-tazobactam (ZOSYN) IVPB 3.375 g         3.375 g 12.5 mL/hr over 240 Minutes Intravenous Every 8 hours  11/14/21 0739     11/04/21 1000  piperacillin-tazobactam (ZOSYN) IVPB 3.375 g        3.375 g 12.5 mL/hr over 240 Minutes Intravenous Every 8 hours 11/04/21 0935 11/11/21 1312   10/27/21 1600  cefTRIAXone (ROCEPHIN) 2 g in sodium chloride 0.9 % 100 mL IVPB  Status:  Discontinued        2 g 200 mL/hr over 30 Minutes Intravenous Every 24 hours 10/27/21 0843 10/27/21 1106   10/27/21 1600  piperacillin-tazobactam (ZOSYN) IVPB 3.375 g        3.375 g 12.5 mL/hr over 240 Minutes Intravenous Every 8 hours 10/27/21 1106 11/03/21 2000   10/27/21 1400  metroNIDAZOLE (FLAGYL) IVPB 500 mg  Status:  Discontinued        500 mg 100 mL/hr over 60 Minutes Intravenous Every 12 hours 10/27/21 0843 10/27/21 1106   10/24/21 1600  piperacillin-tazobactam (ZOSYN) IVPB 3.375 g  Status:  Discontinued        3.375 g 12.5 mL/hr over 240 Minutes Intravenous Every 8 hours 10/24/21 1422 10/27/21 0843   10/24/21 1000  cefTRIAXone (ROCEPHIN) 2 g in sodium chloride 0.9 % 100 mL IVPB  Status:  Discontinued        2 g 200 mL/hr over 30 Minutes Intravenous Every 24 hours 10/24/21 0747 10/24/21 1357   10/23/21 1530  vancomycin (VANCOCIN) IVPB 1000 mg/200 mL premix        1,000 mg 200 mL/hr over 60 Minutes Intravenous  Once 10/23/21 1439 10/23/21 1547   10/23/21 0200  ceFEPIme (MAXIPIME) 2 g in sodium chloride 0.9 % 100 mL IVPB  Status:  Discontinued        2 g 200 mL/hr over 30 Minutes Intravenous Every 12 hours 10/22/21 2032 10/24/21 0747   10/22/21 2200  cefOXitin (MEFOXIN) 2 g in sodium chloride 0.9 % 100 mL IVPB  Status:  Discontinued        2 g 200 mL/hr over 30 Minutes Intravenous Every 8 hours 10/22/21 1706 10/22/21 2032   10/22/21 1956  vancomycin variable dose per unstable renal function (pharmacist dosing)  Status:  Discontinued         Does not apply See admin instructions 10/22/21 1957 10/23/21 1917   10/22/21 1245  vancomycin (VANCOCIN) IVPB  1000 mg/200 mL premix        1,000 mg 200 mL/hr over 60 Minutes Intravenous  Once 10/22/21 1241 10/22/21 1402   10/22/21 1245  ceFEPIme (MAXIPIME) 2 g in sodium chloride 0.9 % 100 mL IVPB        2 g 200 mL/hr over 30 Minutes Intravenous  Once 10/22/21 1241 10/22/21 1321   10/22/21 1230  metroNIDAZOLE (FLAGYL) IVPB 500 mg  Status:  Discontinued        500 mg 100 mL/hr over 60 Minutes Intravenous Every 12 hours 10/22/21 1222 10/24/21 1357        Subjective: Patient seen and examined at bedside.  Poor historian.  No fever, worsening abdominal pain, chest pain or vomiting reported.   Objective: Vitals:   11/26/21 1203 11/26/21 1637 11/26/21 2130 11/27/21 0500  BP: (!) 151/84 (!) 166/79    Pulse:  (!) 107 96   Resp: (!) 22 18    Temp:  99 F (37.2 C) 99.6 F (37.6 C)   TempSrc:  Oral Oral   SpO2:  96% 97%   Weight:    70.8 kg  Height:        Intake/Output Summary (Last 24 hours) at 11/27/2021  0745 Last data filed at 11/27/2021 0600 Gross per 24 hour  Intake 3044.8 ml  Output 2350 ml  Net 694.8 ml    Filed Weights   11/19/21 0330 11/21/21 0500 11/27/21 0500  Weight: 59.9 kg 60.8 kg 70.8 kg    Examination:  General: No acute distress.  Currently on room air.  Chronically ill and deconditioned looking.   ENT/neck: No obvious neck masses or JVD elevation respiratory: Bilateral decreased breath sounds at bases with basilar crackles, intermittently tachypneic  CVS: S1, S2 heard; tachycardic intermittently  abdominal: Soft, diffuse mild tenderness present; slightly distended; no organomegaly, bowel sounds are heard.  Ostomy and Eakin's pouch present Extremities: No cyanosis; trace lower extremity edema present  CNS: Awake and alert.  No focal neurologic deficit.  Moves extremities  lymph: No obvious lymphadenopathy Skin: No obvious lesions/ecchymosis psych: No signs of agitation.  Affect is flat  musculoskeletal: No obvious joint swelling/deformity   Data Reviewed: I have  personally reviewed following labs and imaging studies  CBC: Recent Labs  Lab 11/23/21 0329 11/24/21 0323 11/25/21 0040 11/25/21 0046 11/26/21 0341 11/27/21 0504  WBC 6.8 6.8 7.0  --  5.9 6.9  NEUTROABS 3.5 3.8 4.0  --  3.1 4.0  HGB 7.3* 6.9* 8.0* 8.1* 7.5* 7.9*  HCT 22.5* 21.3* 24.5* 24.1* 22.6* 23.3*  MCV 90.7 90.6 89.7  --  89.0 88.6  PLT 188 183 181  --  180 119    Basic Metabolic Panel: Recent Labs  Lab 11/23/21 0329 11/24/21 0323 11/25/21 0040 11/26/21 0341 11/27/21 0504  NA 129* 130* 125* 129* 131*  K 4.0 4.5 4.5 4.0 4.1  CL 101 102 98 97* 100  CO2 '22 22 22 26 24  '$ GLUCOSE 110* 115* 118* 111* 115*  BUN 33* 30* 31* 36* 38*  CREATININE 0.83 0.86 0.78 0.95 0.81  CALCIUM 7.4* 7.5* 7.5* 7.7* 8.1*  MG 2.0 1.8 1.7 2.2 1.9  PHOS 3.5 3.9 3.6 3.8 4.2    GFR: Estimated Creatinine Clearance: 79.9 mL/min (by C-G formula based on SCr of 0.81 mg/dL). Liver Function Tests: Recent Labs  Lab 11/23/21 0329 11/24/21 0323 11/25/21 0040 11/26/21 0341 11/27/21 0504  AST '16 17 25 24 23  '$ ALT '20 21 27 29 30  '$ ALKPHOS 79 81 115 130* 130*  BILITOT 0.5 0.6 0.5 0.6 0.7  PROT 5.7* 5.5* 5.9* 5.6* 6.2*  ALBUMIN 1.8* 1.7* 1.9* 1.8* 1.9*    No results for input(s): LIPASE, AMYLASE in the last 168 hours. No results for input(s): AMMONIA in the last 168 hours. Coagulation Profile: No results for input(s): INR, PROTIME in the last 168 hours. Cardiac Enzymes: No results for input(s): CKTOTAL, CKMB, CKMBINDEX, TROPONINI in the last 168 hours. BNP (last 3 results) No results for input(s): PROBNP in the last 8760 hours. HbA1C: No results for input(s): HGBA1C in the last 72 hours. CBG: Recent Labs  Lab 11/26/21 0627 11/26/21 1149 11/26/21 1754 11/26/21 2129 11/27/21 0719  GLUCAP 117* 102* 127* 126* 122*    Lipid Profile: No results for input(s): CHOL, HDL, LDLCALC, TRIG, CHOLHDL, LDLDIRECT in the last 72 hours.  Thyroid Function Tests: No results for input(s): TSH,  T4TOTAL, FREET4, T3FREE, THYROIDAB in the last 72 hours. Anemia Panel: No results for input(s): VITAMINB12, FOLATE, FERRITIN, TIBC, IRON, RETICCTPCT in the last 72 hours. Sepsis Labs: No results for input(s): PROCALCITON, LATICACIDVEN in the last 168 hours.  Recent Results (from the past 240 hour(s))  Culture, blood (Routine X 2) w Reflex to ID  Panel     Status: None (Preliminary result)   Collection Time: 11/23/21 10:51 AM   Specimen: BLOOD  Result Value Ref Range Status   Specimen Description   Final    BLOOD RIGHT ANTECUBITAL Performed at [facility] [address], [place], [place] [address]    Special Requests   Final    IN PEDIATRIC BOTTLE Blood Culture adequate volume Performed at [facility] [address], [place], [place] [address]    Culture   Final    NO GROWTH 3 DAYS Performed at [facility], [place] [address] [place], [place] [address]    Report Status PENDING  Incomplete  Culture, blood (Routine X 2) w Reflex to ID Panel     Status: None (Preliminary result)   Collection Time: 11/23/21 11:48 AM   Specimen: BLOOD  Result Value Ref Range Status   Specimen Description   Final    BLOOD BLOOD RIGHT FOREARM Performed at [facility] [address] [place], [place] [address]    Special Requests   Final    BOTTLES DRAWN AEROBIC ONLY Blood Culture adequate volume Performed at [facility] [address] [place], [place] [address]    Culture   Final    NO GROWTH 3 DAYS Performed at [facility], [place] [address], [place], [place] [address]    Report Status PENDING  Incomplete          Radiology Studies: No results found.      Scheduled Meds:  vitamin C  500 mg Per Tube BID   chlorhexidine  15 mL Mouth Rinse BID   Chlorhexidine Gluconate Cloth  6 each Topical Daily   feeding supplement  237 mL Oral BID BM   heparin injection (subcutaneous)  5,000 Units  Subcutaneous Q8H   mouth rinse  15 mL Mouth Rinse q12n4p   megestrol  400 mg Oral BID   metoprolol tartrate  50 mg Oral BID   nicotine  14 mg Transdermal Daily   pantoprazole (PROTONIX) IV  40 mg Intravenous Q24H   sodium chloride flush  10-40 mL Intracatheter Q12H   sodium chloride flush  5 mL Intracatheter Q12H   sodium chloride flush  5 mL Intracatheter Q8H   sodium chloride  1 g Oral BID WC   vitamin A  10,000 Units Per Tube Daily   Continuous Infusions:  sodium chloride 250 mL (11/22/21 0019)   piperacillin-tazobactam (ZOSYN)  IV 3.375 g (11/27/21 0026)   TPN ADULT (ION) 70 mL/hr at 11/26/21 1741          Trinidad Ingle Starla Link, MD [facility] 11/27/2021, 7:45 AM

## 2021-11-27 NOTE — Progress Notes (Signed)
PHARMACY - TOTAL PARENTERAL NUTRITION CONSULT NOTE   Indication: Prolonged ileus  Patient Measurements: Height: _0  (177.8 cm) Weight: 70.8 kg (156 lb 1.4 oz) IBW/kg (Calculated) : 68.5 TPN AdjBW (KG): 53.8 Body mass index is 22.4 kg/m. Usual Weight: 53.8 kg  Assessment: 61 yo F w/ PMH etoh use disorder, smoker, HTN, GERD. S/p Ex Lap Hartmans 10/22/2021 for perforated sigmoid colon.  Glucose / Insulin: no hx DM; CBGs continue to be well controlled after stopping SSI Electrolytes: Na low but increased despite reduction in daily dose (d/t concentrating TPN) suggesting more related to volume overload than actual Na defecit - Lasix ordered again today; Cl improved to WNL; others stable WNL Vitamins:   - Vitamin C <0.1 (4/22); receiving 200 mg ascorbic acid daily in TPN from [facility] - Vitamin A 14.2 (4/22); receiving 1 mg of vitamin A daily in TPN from [facility] - Unable to add additional ascorbic acid and vitamin A to TPN per TPN policy - 5/5 - initiated ascorbic acid 500 mg per tube BID and vitamin A 10,000 units per tube daily. Tube to be clamped x1 hour after administration then returned to suction.  Renal: SCr stable WNL; BUN rising; UOP robust on Lasix 60 mg/d Hepatic: LFTs, Tbili, Alk Phos borderline elevated; TG stable WNL.  - Albumin remains low - prealbumin 18.2 on 5/13  Intake / Output: Diuresing but pt consumed 1740 ml PO liquids overnight; RN to notify MD for fluid restriction - reportedly PO intake was improving going into the weekend, but worse on Sat/Sun <<50% of goals - no appreciable drain or stool OP charted yesterday - no MIVF GI Imaging:  - 4/15 CTA: bowel perforation - 4/17 KUB ileus or pSBO - 4/20 CTA: severe ileus or pSBO - 5/04 CT: new pelvic collection not amenable to drainage, likely an abscess - 5/11 CTa/p: fluid collections unchanged from 5/4 - 5/17 abd CT: Central pelvic enhancing fluid collection has decreased in size. Diffusely dilated small bowel containing  contrast worrisome for small bowel ileus or partial distal small bowel obstruction. Increasing bilateral pleural effusions. GI Surgeries / Procedures:  - 10/22/21 s/p EXPLORATORY LAPAROTOMY hartmans for perforated sigmoid colon - 4/23 IR placed drain LLQ - 5/10 Drainage cath exchanged  Central access: CVC TL placed 10/23/21 TPN start date: 10/26/21  Nutritional Goals: - goal for ileus: Mag > 2, K > 4, phos ~3 RD Assessment: (5/10) Estimated Needs Total Energy Estimated Needs: 2000-2200 kcal Total Protein Estimated Needs: 105-125 grams Total Fluid Estimated Needs: >/= 2.2 L/day  Current Nutrition:  Soft diet, minimal intake (< 50%) Refusing all supplements Goal concentrated TPN rate is 70 mL/hr (provides 124 g of protein and 2040 kcals per day - 100% of goal)   Plan:  Given fluid overload requiring diuresis, concentrating TPN as able.  Clinisol already in use for another patient per TPN pharmacist, so taking advantage of this.  At 18:00:   Continue concentrated TPN with clinisol at 70 mL/hr  Electrolytes in TPN: no changes from yesterday Na 154 mEq/L K 55 mEq/L Ca 4 mEq/L Mg 10 mEq/L (cannot incr further d/t stability/compatibility concerns) Phos 2 mmol/L Cl:Ac 2:1 Continue standard [facility] and trace elements to TPN Continue folic acid & thiamine in TPN Monitor TPN labs on Mon/Thurs Follow up oral intake for appropriate wean off TPN    Morena Mckissack A, PharmD, BCPS Clinical Pharmacist 11/27/2021 9:47 AM

## 2021-11-28 DIAGNOSIS — A498 Other bacterial infections of unspecified site: Secondary | ICD-10-CM

## 2021-11-28 LAB — CBC WITH DIFFERENTIAL/PLATELET
Abs Immature Granulocytes: 0.02 10*3/uL (ref 0.00–0.07)
Basophils Absolute: 0 10*3/uL (ref 0.0–0.1)
Basophils Relative: 0 %
Eosinophils Absolute: 0.2 10*3/uL (ref 0.0–0.5)
Eosinophils Relative: 4 %
HCT: 24.2 % — ABNORMAL LOW (ref 36.0–46.0)
Hemoglobin: 8 g/dL — ABNORMAL LOW (ref 12.0–15.0)
Immature Granulocytes: 0 %
Lymphocytes Relative: 32 %
Lymphs Abs: 2 10*3/uL (ref 0.7–4.0)
MCH: 29.3 pg (ref 26.0–34.0)
MCHC: 33.1 g/dL (ref 30.0–36.0)
MCV: 88.6 fL (ref 80.0–100.0)
Monocytes Absolute: 0.7 10*3/uL (ref 0.1–1.0)
Monocytes Relative: 12 %
Neutro Abs: 3.2 10*3/uL (ref 1.7–7.7)
Neutrophils Relative %: 52 %
Platelets: 195 10*3/uL (ref 150–400)
RBC: 2.73 MIL/uL — ABNORMAL LOW (ref 3.87–5.11)
RDW: 16.9 % — ABNORMAL HIGH (ref 11.5–15.5)
WBC: 6.2 10*3/uL (ref 4.0–10.5)
nRBC: 0 % (ref 0.0–0.2)

## 2021-11-28 LAB — COMPREHENSIVE METABOLIC PANEL
ALT: 31 U/L (ref 0–44)
AST: 21 U/L (ref 15–41)
Albumin: 1.9 g/dL — ABNORMAL LOW (ref 3.5–5.0)
Alkaline Phosphatase: 129 U/L — ABNORMAL HIGH (ref 38–126)
Anion gap: 7 (ref 5–15)
BUN: 36 mg/dL — ABNORMAL HIGH (ref 6–20)
CO2: 21 mmol/L — ABNORMAL LOW (ref 22–32)
Calcium: 8.2 mg/dL — ABNORMAL LOW (ref 8.9–10.3)
Chloride: 103 mmol/L (ref 98–111)
Creatinine, Ser: 0.87 mg/dL (ref 0.44–1.00)
GFR, Estimated: >60 mL/min (ref >60)
Glucose, Bld: 111 mg/dL — ABNORMAL HIGH (ref 70–99)
Potassium: 4.3 mmol/L (ref 3.5–5.1)
Sodium: 131 mmol/L — ABNORMAL LOW (ref 135–145)
Total Bilirubin: 0.4 mg/dL (ref 0.3–1.2)
Total Protein: 6.1 g/dL — ABNORMAL LOW (ref 6.5–8.1)

## 2021-11-28 LAB — CULTURE, BLOOD (ROUTINE X 2)
Culture: NO GROWTH
Culture: NO GROWTH
Special Requests: ADEQUATE
Special Requests: ADEQUATE

## 2021-11-28 LAB — GLUCOSE, CAPILLARY
Glucose-Capillary: 111 mg/dL — ABNORMAL HIGH (ref 70–99)
Glucose-Capillary: 112 mg/dL — ABNORMAL HIGH (ref 70–99)
Glucose-Capillary: 124 mg/dL — ABNORMAL HIGH (ref 70–99)

## 2021-11-28 LAB — PHOSPHORUS: Phosphorus: 4 mg/dL (ref 2.5–4.6)

## 2021-11-28 LAB — TRIGLYCERIDES: Triglycerides: 85 mg/dL (ref <150)

## 2021-11-28 LAB — MAGNESIUM: Magnesium: 1.7 mg/dL (ref 1.7–2.4)

## 2021-11-28 MED ORDER — DEXTROSE 5 % IV SOLN
1000.0000 mg | Freq: Four times a day (QID) | INTRAVENOUS | Status: DC | PRN
  Filled 2021-11-28: qty 10

## 2021-11-28 MED ORDER — FUROSEMIDE 40 MG PO TABS
40.0000 mg | ORAL_TABLET | Freq: Once | ORAL | Status: AC
Start: 2021-11-28 — End: 2021-11-28
  Administered 2021-11-28: 40 mg via ORAL
  Filled 2021-11-28: qty 1

## 2021-11-28 MED ORDER — FERROUS SULFATE 220 (44 FE) MG/5ML PO ELIX: 330.0000 mg | ORAL_SOLUTION | Freq: Two times a day (BID) | ORAL | Status: DC

## 2021-11-28 MED ORDER — OXYCODONE HCL 5 MG/5ML PO SOLN
5.0000 mg | ORAL | Status: DC | PRN
  Administered 2021-11-28 – 2021-12-04 (×15): 10 mg
  Filled 2021-11-28 (×16): qty 10

## 2021-11-28 MED ORDER — ENSURE ENLIVE PO LIQD
237.0000 mL | Freq: Three times a day (TID) | ORAL | Status: DC
  Administered 2021-11-28 – 2021-12-01 (×4): 237 mL

## 2021-11-28 MED ORDER — NUTRISOURCE FIBER PO PACK
1.0000 | PACK | Freq: Two times a day (BID) | ORAL | Status: DC
  Administered 2021-11-28: 1
  Filled 2021-11-28 (×5): qty 1

## 2021-11-28 MED ORDER — ACETAMINOPHEN 160 MG/5ML PO SOLN
1000.0000 mg | Freq: Four times a day (QID) | ORAL | Status: DC
  Administered 2021-11-28 – 2021-11-29 (×2): 1000 mg
  Filled 2021-11-28 (×3): qty 40.6

## 2021-11-28 MED ORDER — SODIUM CHLORIDE 0.9 % IV SOLN
125.0000 mg | Freq: Once | INTRAVENOUS | Status: AC
  Administered 2021-11-28: 125 mg via INTRAVENOUS
  Filled 2021-11-28: qty 10

## 2021-11-28 MED ORDER — PHENOL 1.4 % MT LIQD: 2.0000 | OROMUCOSAL | Status: DC | PRN

## 2021-11-28 MED ORDER — ENALAPRILAT 1.25 MG/ML IV SOLN
0.6250 mg | Freq: Four times a day (QID) | INTRAVENOUS | Status: DC | PRN
  Filled 2021-11-28: qty 1

## 2021-11-28 MED ORDER — FENTANYL CITRATE PF 50 MCG/ML IJ SOSY
12.5000 ug | PREFILLED_SYRINGE | INTRAMUSCULAR | Status: DC | PRN
  Administered 2021-11-28: 12.5 ug via INTRAVENOUS
  Filled 2021-11-28: qty 1

## 2021-11-28 MED ORDER — MAGNESIUM SULFATE 2 GM/50ML IV SOLN
2.0000 g | Freq: Once | INTRAVENOUS | Status: AC
  Administered 2021-11-28: 2 g via INTRAVENOUS
  Filled 2021-11-28: qty 50

## 2021-11-28 MED ORDER — LIP MEDEX EX OINT
1.0000 "application " | TOPICAL_OINTMENT | Freq: Two times a day (BID) | CUTANEOUS | Status: DC
  Administered 2021-11-28 – 2021-12-06 (×16): 1 via TOPICAL
  Filled 2021-11-28 (×2): qty 7

## 2021-11-28 MED ORDER — PROCHLORPERAZINE EDISYLATE 10 MG/2ML IJ SOLN: 5.0000 mg | INTRAMUSCULAR | Status: DC | PRN

## 2021-11-28 MED ORDER — METHOCARBAMOL 500 MG PO TABS
1000.0000 mg | ORAL_TABLET | Freq: Four times a day (QID) | ORAL | Status: DC | PRN
  Administered 2021-11-29 – 2021-12-05 (×6): 1000 mg via ORAL
  Filled 2021-11-28 (×6): qty 2

## 2021-11-28 MED ORDER — MAGIC MOUTHWASH
15.0000 mL | Freq: Four times a day (QID) | ORAL | Status: DC | PRN
Start: 2021-11-28 — End: 2021-12-06
  Filled 2021-11-28: qty 15

## 2021-11-28 MED ORDER — FERROUS SULFATE 325 (65 FE) MG PO TABS: 325.0000 mg | ORAL_TABLET | Freq: Two times a day (BID) | ORAL | Status: DC

## 2021-11-28 MED ORDER — SIMETHICONE 40 MG/0.6ML PO SUSP
80.0000 mg | Freq: Four times a day (QID) | ORAL | Status: DC | PRN
Start: 2021-11-28 — End: 2021-12-06

## 2021-11-28 MED ORDER — SODIUM CHLORIDE 1 G PO TABS
1.0000 g | ORAL_TABLET | Freq: Two times a day (BID) | ORAL | Status: DC
  Administered 2021-11-28: 1 g
  Filled 2021-11-28: qty 1

## 2021-11-28 MED ORDER — IOHEXOL 9 MG/ML PO SOLN: 500.0000 mL | ORAL | Status: DC

## 2021-11-28 MED ORDER — PANTOPRAZOLE 2 MG/ML SUSPENSION
40.0000 mg | Freq: Every day | ORAL | Status: DC
  Administered 2021-11-28: 40 mg
  Filled 2021-11-28 (×4): qty 20

## 2021-11-28 MED ORDER — DIPHENHYDRAMINE HCL 12.5 MG/5ML PO ELIX
25.0000 mg | ORAL_SOLUTION | Freq: Four times a day (QID) | ORAL | Status: DC | PRN
  Administered 2021-11-28: 25 mg
  Filled 2021-11-28: qty 10

## 2021-11-28 MED ORDER — TRACE MINERALS CU-MN-SE-ZN 300-55-60-3000 MCG/ML IV SOLN
INTRAVENOUS | Status: AC
  Filled 2021-11-28: qty 828.8

## 2021-11-28 MED ORDER — DIPHENHYDRAMINE HCL 50 MG/ML IJ SOLN
12.5000 mg | Freq: Four times a day (QID) | INTRAMUSCULAR | Status: DC | PRN
  Administered 2021-11-29: 25 mg via INTRAVENOUS
  Filled 2021-11-28: qty 1

## 2021-11-28 MED ORDER — GABAPENTIN 100 MG PO CAPS: 200.0000 mg | ORAL_CAPSULE | Freq: Three times a day (TID) | ORAL | Status: DC

## 2021-11-28 MED ORDER — ALUM & MAG HYDROXIDE-SIMETH 200-200-20 MG/5ML PO SUSP
30.0000 mL | Freq: Four times a day (QID) | ORAL | Status: DC | PRN
Start: 2021-11-28 — End: 2021-12-06
  Administered 2021-12-01: 30 mL
  Filled 2021-11-28: qty 30

## 2021-11-28 MED ORDER — IOHEXOL 9 MG/ML PO SOLN
ORAL | Status: AC
  Filled 2021-11-28: qty 1000

## 2021-11-28 MED ORDER — CALCIUM POLYCARBOPHIL 625 MG PO TABS: 625.0000 mg | ORAL_TABLET | Freq: Two times a day (BID) | ORAL | Status: DC

## 2021-11-28 MED ORDER — PANTOPRAZOLE SODIUM 40 MG PO TBEC: 40.0000 mg | DELAYED_RELEASE_TABLET | Freq: Every day | ORAL | Status: DC

## 2021-11-28 MED ORDER — MENTHOL 3 MG MT LOZG
1.0000 | LOZENGE | OROMUCOSAL | Status: DC | PRN
Start: 2021-11-28 — End: 2021-12-06

## 2021-11-28 MED ORDER — METOPROLOL TARTRATE 25 MG/10 ML ORAL SUSPENSION
50.0000 mg | Freq: Two times a day (BID) | ORAL | Status: DC
  Administered 2021-11-28: 50 mg
  Filled 2021-11-28 (×2): qty 20

## 2021-11-28 MED ORDER — LOPERAMIDE HCL 2 MG PO CAPS
2.0000 mg | ORAL_CAPSULE | Freq: Two times a day (BID) | ORAL | Status: DC
Start: 2021-11-28 — End: 2021-11-29
  Administered 2021-11-28 (×2): 2 mg via ORAL
  Filled 2021-11-28 (×2): qty 1

## 2021-11-28 MED ORDER — FENTANYL CITRATE PF 50 MCG/ML IJ SOSY
12.5000 ug | PREFILLED_SYRINGE | INTRAMUSCULAR | Status: DC | PRN
  Administered 2021-11-29 – 2021-12-06 (×20): 25 ug via INTRAVENOUS
  Filled 2021-11-28 (×21): qty 1

## 2021-11-28 MED ORDER — ACETAMINOPHEN 500 MG PO TABS: 1000.0000 mg | ORAL_TABLET | Freq: Four times a day (QID) | ORAL | Status: DC

## 2021-11-28 MED ORDER — IOHEXOL 9 MG/ML PO SOLN: 500.0000 mL | ORAL | Status: AC

## 2021-11-28 MED ORDER — GABAPENTIN 250 MG/5ML PO SOLN
300.0000 mg | Freq: Every day | ORAL | Status: DC
  Administered 2021-11-28 – 2021-12-05 (×8): 300 mg via ORAL
  Filled 2021-11-28 (×10): qty 6

## 2021-11-28 MED ORDER — FERROUS SULFATE 325 (65 FE) MG PO TABS
325.0000 mg | ORAL_TABLET | Freq: Two times a day (BID) | ORAL | Status: DC
  Administered 2021-11-28: 325 mg via ORAL
  Filled 2021-11-28: qty 1

## 2021-11-28 MED ORDER — LORAZEPAM 2 MG/ML IJ SOLN
0.5000 mg | Freq: Three times a day (TID) | INTRAMUSCULAR | Status: DC | PRN
  Administered 2021-11-28 – 2021-12-01 (×5): 0.5 mg via INTRAVENOUS
  Filled 2021-11-28 (×5): qty 1

## 2021-11-28 NOTE — Progress Notes (Signed)
Nutrition Follow-up  DOCUMENTATION CODES:   Non-severe (moderate) malnutrition in context of chronic illness, Underweight  INTERVENTION:   Enteral feeds per surgery: Ensure Plus High Protein TID via G-tube. Total: 1050 kcals, 60g protein and 540 ml H2O  -TPN management per Pharmacy  -48 hour Calorie Count ordered per surgery   NUTRITION DIAGNOSIS:   Moderate Malnutrition related to chronic illness (alcohol abuse) as evidenced by moderate fat depletion, moderate muscle depletion, severe muscle depletion.  Ongoing.  GOAL:   Patient will meet greater than or equal to 90% of their needs  Meeting With TPN + TF  MONITOR:   PO intake, Supplement acceptance, Diet advancement, Labs, Weight trends, Other (Comment), I & O's (TPN regimen)  REASON FOR ASSESSMENT:   Consult Calorie Count  ASSESSMENT:   61 year old female with medical history of tobacco abuse and alcohol use disorder with prior severe withdrawal/DT requiring ICU admission and precede. She presented to the ED due to severe, diffuse abdominal pain that began the night prior and constipation x4 days. She reported frequent alcohol ingestion and last drink was the day PTA. In ED she was found to have pneumoperitoneum and free fluid on CT abdomen/pelvis and severe lactic acidosis. She was started on broad spectrum antibiotics and has been given a total of 5L of crystalloid. Surgery was consulted and she was taken to OR where she underwent ex lap and was found to have perforated sigmoid colon, then had hartman's procedure and insertion of g-tube which was left to gravity drainage, JP bulb in pelvis. She was noted to have feculent peritonitis in the pelvis.  48 hour Calorie Count ordered by surgery. Starting with breakfast this morning. Results available 5/23.   So far, documentation shows pt ate 90% of breakfast.   Plan per surgery, if meeting 50% of needs PO then TPN can be halved. TPN to be d/c once meeting 100% of needs via  Ensure and PO intakes.   Per surgery note, pt to start supplemental feeds via G-tube with Ensure. Ordered every 8 hours. Free water flushes ordered for 120 ml every 8 hours.   TPN continues at 70 ml/hr (provides 2040 kcals, 124g protein).  Admission weight: 118 lbs. Current weight: 143 lbs  I/Os: +4.5L since 5/8  Medications: Vitamin C, Ferrous sulfate, Nutrisource Fiber, Imodium, Megace, Vitamin A, Ferric gluconate, IV Mg sulfate  Labs reviewed: CBGs: 102-127 Low Na TG: 85   Diet Order:   Diet Order             Diet Heart Room service appropriate? Yes; Fluid consistency: Thin  Diet effective now                   EDUCATION NEEDS:   Not appropriate for education at this time  Skin:  Skin Assessment: Skin Integrity Issues: Skin Integrity Issues:: DTI, Incisions DTI: vertebral column Incisions: abdomen (4/15 and 4/21)  Last BM:  5/21 -colostomy  Height:   Ht Readings from Last 1 Encounters:  11/08/21 '5\' 10"'$  (1.778 m)    Weight:   Wt Readings from Last 1 Encounters:  11/28/21 65 kg    Ideal Body Weight:  69.3 kg  BMI:  Body mass index is 20.56 kg/m.  Estimated Nutritional Needs:   Kcal:  2000-2200 kcal  Protein:  105-125 grams  Fluid:  >/= 2.2 L/day   Clayton Bibles, MS, RD, LDN Inpatient Clinical Dietitian Contact information available via Amion

## 2021-11-28 NOTE — Progress Notes (Signed)
Physical Therapy Treatment Patient Details Name: Jessica [name] MRN: [id number] DOB: 02-Aug-1960 Today's Date: 11/28/2021   History of Present Illness Jessica [name] is an 61 y.o. female past medical history significant for alcohol abuse, with admissions for severe alcohol withdrawals requiring ICU admissions tobacco use presented to the ED on 10/22/2021 diffuse abdominal pain that started the day prior to admission.  CT of the abdomen pelvis showed pneumoperitoneum and free fluid, severe sepsis with started on broad-spectrum antibiotics fluid resuscitated surgery was consulted was taken emergently to the OR underwent exploratory laparotomy and was found to have a perforated sigmoid colon, status post Hartmann procedure and insertion of the G-tube which was left to drain to gravity, JP in pelvis was noted to have feculent peritonitis in the pelvis.  ICU course was complicated by Klebsiella bacteremia profound caloric protein malnutrition requiring TPN, acute respiratory failure with hypoxia and tachycardia with failure to progress    PT Comments    Pt motivated to walk.  BF present and also very helpful getting pt OOB and amb in hallway with B platform.  Will attempt to use a regular RW next session to progress gait as pt wants to go home vs SNF.    Recommendations for follow up therapy are one component of a multi-disciplinary discharge planning process, led by the attending physician.  Recommendations may be updated based on patient status, additional functional criteria and insurance authorization.  Follow Up Recommendations  Skilled nursing-short term rehab (<3 hours/day) (pt wants to go home)     Assistance Recommended at Discharge Frequent or constant Supervision/Assistance  Patient can return home with the following Two people to help with walking and/or transfers;A lot of help with bathing/dressing/bathroom;Assistance with cooking/housework;Assist for transportation;Help with stairs or  ramp for entrance   Equipment Recommendations  None recommended by PT    Recommendations for Other Services       Precautions / Restrictions Precautions Precautions: Fall Precaution Comments: multiple lines--> L JP drain, gastrostomy tube, triple lumen central line, colostomy Restrictions Weight Bearing Restrictions: No RUE Weight Bearing: Non weight bearing Other Position/Activity Restrictions: recent R UE/shoulder fx - assume NWB, though per chart review RUE WB status expired on 10/27/21.     Mobility  Bed Mobility Overal bed mobility: Needs Assistance Bed Mobility: Supine to Sit, Sit to Supine     Supine to sit: Min assist Sit to supine: Min assist, Mod assist   General bed mobility comments: HOB elevated and increased time with extra assist to complete scooting to EOB and support b LE back onto bed.    Transfers Overall transfer level: Needs assistance Equipment used: Bilateral platform walker Transfers: Sit to/from Stand Sit to Stand: Min guard, Min assist           General transfer comment: self able to rise from elevated bed with increased time onto B platform walker    Ambulation/Gait Ambulation/Gait assistance: Min assist, +2 safety/equipment Gait Distance (Feet): 145 Feet Assistive device: Bilateral platform walker Gait Pattern/deviations: Step-to pattern, Step-through pattern, Decreased step length - right, Decreased step length - left, Decreased stance time - right, Trunk flexed Gait velocity: decreased     General Gait Details: using B Platform EVA walker and + 2 assist with Sig Other following with recliner, pt was able to amb 95 feet but with much effort and lean on walker plarforms.    Tolerated well.  Assisted back to bed.  Need to advance her to a regular walker but she gets nervous.  Stairs             Wheelchair Mobility    Modified Rankin (Stroke Patients Only)       Balance                                             Cognition Arousal/Alertness: Awake/alert Behavior During Therapy: WFL for tasks assessed/performed Overall Cognitive Status: Within Functional Limits for tasks assessed                                 General Comments: AxO x 3 increased motivation.  Pt wants to go home and NOT a "Nursing Home".  Stated her Boy Friend "will be there to help me".        Exercises      General Comments        Pertinent Vitals/Pain Pain Assessment Pain Assessment: Faces Faces Pain Scale: Hurts a little bit Pain Location: abdomen Pain Descriptors / Indicators: Discomfort, Grimacing, Guarding Pain Intervention(s): Monitored during session, Repositioned    Home Living                          Prior Function            PT Goals (current goals can now be found in the care plan section) Progress towards PT goals: Progressing toward goals    Frequency    Min 2X/week      PT Plan Current plan remains appropriate    Co-evaluation              AM-PAC PT "6 Clicks" Mobility   Outcome Measure  Help needed turning from your back to your side while in a flat bed without using bedrails?: A Little Help needed moving from lying on your back to sitting on the side of a flat bed without using bedrails?: A Little Help needed moving to and from a bed to a chair (including a wheelchair)?: A Little Help needed standing up from a chair using your arms (e.g., wheelchair or bedside chair)?: A Little Help needed to walk in hospital room?: A Little Help needed climbing 3-5 steps with a railing? : A Lot 6 Click Score: 17    End of Session Equipment Utilized During Treatment: Gait belt Activity Tolerance: Patient tolerated treatment well Patient left: in bed;with bed alarm set;with call bell/phone within reach Nurse Communication: Mobility status PT Visit Diagnosis: Muscle weakness (generalized) (M62.81);History of falling (Z91.81);Difficulty in walking, not  elsewhere classified (R26.2);Pain Pain - Right/Left: Right Pain - part of body: Shoulder     Time: 3832-9191 PT Time Calculation (min) (ACUTE ONLY): 25 min  Charges:  $Therapeutic Activity: 8-22 mins                     Rica Koyanagi  PTA Acute  Rehabilitation Services Pager      [phone] Office      [phone]

## 2021-11-28 NOTE — Progress Notes (Addendum)
Jessica [name] [id number] April 12, 1961  CARE TEAM:  PCP: Deland Pretty, MD  Outpatient Care Team: Patient Care Team: Deland Pretty, MD as PCP - General (Internal Medicine)  Inpatient Treatment Team: Treatment Team: Attending Provider: Aline August, MD; Technician: Mee Hives, NT; Consulting Physician: Edison Pace Md, MD; [place] Nurse: Lissa Morales Rudi Heap, RN; Rounding Team: Dorthy Cooler Radiology, MD; Consulting Physician: Mignon Pine, DO; Rounding Team: Garner Gavel, MD; Technician: Darylene Price, [place]; Janeece Riggers: Simeon Craft; Registered Nurse: Mahalia Longest, RN (Inactive); Physical Therapy Assistant: Diego Cory, PTA; Pharmacist: Royetta Asal, [facility]; Utilization Review: Alease Medina, RN   Problem List:   Principal Problem:   Severe sepsis due to perforated sigmoid colon s/p Hartmann/colostomy 10/22/2021 Active Problems:   Substance induced mood disorder ([place])   Anxiety   Chronic pain   Primary insomnia   Tobacco use disorder   Underweight   Protein-calorie malnutrition, severe (HCC)   Colostomy in place [facility])   AKI (acute kidney injury) ([place])   Stricture and stenosis of esophagus   Gastrostomy tube in place [facility])   Acute respiratory failure with hypoxia ([place])   Acute metabolic encephalopathy   Hypernatremia, hyponatremia, hypokalemia, hyperphosphatemia   Hyponatremia   Elevated brain natriuretic peptide (BNP) level   Right proximal humeral fracture   Oropharyngeal dysphagia   Normocytic anemia   Palliative care by specialist   Goals of care, counseling/discussion   General weakness   Septic shock ([place])   Postoperative intra-abdominal abscess   Colocutaneous fistula   Generalized abdominal pain   Aspiration pneumonia ([place])   Bacteremia due to Klebsiella pneumoniae   Acute encephalopathy   Humerus fracture   37 Days Post-Op    10/22/2021  POST-OPERATIVE DIAGNOSIS:  PERFORATED SIGMOID COLON, MALNUTRITION   PROCEDURE:    EXPLORATORY LAPAROTOMY HARTMAN'S PROCEDURE (SIGMOID COLECTOMY/COLOSTOMY) INSERTION OF FEEDING G TUBE   SURGEON: :Leighton Ruff, MD   OR FINDINGS: Purulent ascites throughout the abdomen and stool contamination in the pelvis due to necrotic perforated sigmoid colon   FINAL MICROSCOPIC DIAGNOSIS:   COLON, SIGMOID, RESECTION:  - Diverticulitis with perforation, pericolonic abscess and acute  serositis.  - Negative for malignancy.    10/29/2021   CT-guided drainage of left lower quadrant abscess.  Cultures consistent with Pseudomonas aeruginosas.  Pansensitive including to piperacillin/tazobactam   ASSESSMENT:  Slowly improving status post emergent Hartmann resection for perforated diverticulitis with feculent peritonitis and gastrostomy tube placement  [facility] Stay = 37 days)  Plan:  - Midline wound with colocutaneous fistula.  -Reflection of her severe feculent peritonitis and malnutrition.   -Continue Eakins pouch. WOCN following for colostomy and pouching of fistula.   -Follow output.  We will do fiber/iron/loperamide BID.  Imodium as needed.  -CT scan today to see if we can localize the source if this is small bowel or colon.   -Give time to see if this will close on its own or if it may require surgery to resect/takedown.  Usually have to wait 6 months from initial operation to give a chance to allow normal nutrition, minimal scarring/inflammation, better chance of success of repair.  Transition from parenteral to enteral pain control.  Scheduled Tylenol and gabapentin nightly.  Offer oxycodone enterally.  Expand interval on fentanyl and try and wean off IV narcotic  FEN  -Severe protein calorie malnutrition reflection of baseline and complicated prolonged hospital course. -Total nutrition.  TPN main source for now -Solid heart diet.  Appetite poor.   -We will add supplemental  tube feeds -See if we can start weaning off TPN.   -Check prealbumin and nutritional labs  weekly - last prealbumin 18.2.   -If enterocutaneous fistula too high output and not controllable with bowel regimen, may need to have limited p.o. intake and rely on TPN longer.  We will see   ID  -pelvic abscess status post percutaneous drainage 4/22.  Grew pansensitive Pseudomonas.   -IV ABX: Piperacillin/tazobactam Zosyn 4/17 - 4/27; 4/28-5/6, 5/8 >> -Repeat CT scan today to follow fluid collections that have not been amenable to percutaneous drainage.   -IV Zosyn antibiotics for multiple fluid collections/abscesses.  Largest drainable.  Others not.  Infectious disease following.  Suspect need to continue until evidence of collections resolving and better source control.  No evidence of active shock at this time encouraging.  Hypertension.  On metoprolol.  Intermittent tachycardia seems to be related to stress and anxiety.  No evidence of any dysrhythmias.  Consider DC telemetry.  Defer to primary service.    GERD.  Continue PPI.  VTE - SCDs, SQH   Rehab: Mobilize as tolerated to help recovery.  Current work with therapies.  Two-person assist.  Consider DC telemtry & moving to third floor for wound management and therapy.  Suspect she will need SNF.  We will see.  Disposition: TBD -suspect it will be early June      I reviewed nursing notes, hospitalist notes, last 24 h vitals and pain scores, last 48 h intake and output, last 24 h labs and trends, and last 24 h imaging results. I have reviewed this patient's available data, including medical history, events of note, test results, etc as part of my evaluation.  A significant portion of that time was spent in counseling.  Care during the described time interval was provided by me.  This care required high  level of medical decision making.  11/28/2021    Subjective: (Chief complaint)  Patient pleasant and chatty today.  Tearful but appreciative.   Nursing in room.  Got up to chair with 2 person assist.  Wiped her out.  Prefers  IV fentanyl with pain and soreness.  Trying to take some p.o.  Appetite not great but no nausea or vomiting.  Objective:  Vital signs:  Vitals:   11/27/21 1844 11/27/21 2000 11/28/21 0500 11/28/21 0521  BP:  (!) 143/79  (!) 142/73  Pulse:  (!) 102  90  Resp:  (!) 21  17  Temp: (!) 100.4 F (38 C) 100.2 F (37.9 C)  98.4 F (36.9 C)  TempSrc: Oral Oral  Oral  SpO2:  97%  99%  Weight:   65 kg   Height:        Last BM Date : 11/26/21  Intake/Output   Yesterday:  05/21 0701 - 05/22 0700 In: 3123.9 [P.O.:1500; I.V.:1508.9; IV Piggyback:115] Out: 8756 [Urine:4150; Drains:175; Stool:150] This shift:  No intake/output data recorded.  Bowel function:  Flatus: YES  BM:  YES  Eakin pouch - feculent drainage  Drain: Left lower quadrant percutaneous drain connected to bilirubin gravity bag.  Feculent output.   Physical Exam:  General: Pt awake/alert in no acute distress.  Mildly cachectic. Eyes: PERRL, normal EOM.  Sclera clear.  No icterus Neuro: CN II-XII intact w/o focal sensory/motor deficits. Lymph: No head/neck/groin lymphadenopathy Psych: Awake alert.  Pleasant and chatty.  Tearful but personally consolable.  No delerium/psychosis/paranoia.  Oriented x 4 HENT: Normocephalic, Mucus membranes moist.  No thrush Neck: Supple, No tracheal deviation.  No obvious thyromegaly Chest: No pain to chest wall compression.  Good respiratory excursion.  No audible wheezing CV:  Pulses intact.  Regular rhythm.  No major extremity edema MS: Normal AROM mjr joints.  No obvious deformity  Abdomen: Somewhat firm.  Mildy distended.  Mildly tender at incisions only.  No evidence of peritonitis.  No incarcerated hernias. Left-sided colostomy pink with thin stool in bag.  Midline incision covered with Eakin's pouch with thin stool.  Left lower quadrant drain site clean.  Feculent output.  Ext:  No deformity.  No mjr edema.  No cyanosis Skin: No petechiae / purpurea.  No major sores.  Warm  and dry    Results:   Cultures: Recent Results (from the past 720 hour(s))  Aerobic/Anaerobic Culture w Gram Stain (surgical/deep wound)     Status: None   Collection Time: 10/29/21 10:31 AM   Specimen: Abdomen; Abscess  Result Value Ref Range Status   Specimen Description   Final    ABDOMEN PERITONEAL Performed at [facility] [address] [place], [place] [address]    Special Requests   Final    NONE Performed at [facility], [place] [address], [place], [place] [address]    Gram Stain   Final    NO SQUAMOUS EPITHELIAL CELLS SEEN MODERATE WBC SEEN NO ORGANISMS SEEN    Culture   Final    RARE PSEUDOMONAS AERUGINOSA NO ANAEROBES ISOLATED Performed at [facility], [address] [place] [address], [place], [place] [address]    Report Status 11/03/2021 FINAL  Final   Organism ID, Bacteria PSEUDOMONAS AERUGINOSA  Final      Susceptibility   Pseudomonas aeruginosa - MIC*    CEFTAZIDIME 4 SENSITIVE Sensitive     CIPROFLOXACIN <=0.25 SENSITIVE Sensitive     GENTAMICIN <=1 SENSITIVE Sensitive     IMIPENEM 1 SENSITIVE Sensitive     PIP/TAZO 8 SENSITIVE Sensitive     CEFEPIME 2 SENSITIVE Sensitive     * RARE PSEUDOMONAS AERUGINOSA  Culture, blood (Routine X 2) w Reflex to ID Panel     Status: None   Collection Time: 10/31/21  6:56 PM   Specimen: BLOOD  Result Value Ref Range Status   Specimen Description   Final    BLOOD BLOOD LEFT HAND Performed at [facility] [address] [place], [place] [address]    Special Requests   Final    IN PEDIATRIC BOTTLE Blood Culture adequate volume Performed at [place] [address], [place], [place] [address]    Culture   Final    NO GROWTH 5 DAYS Performed at [facility], [place] [address], [place], [place] [address]    Report Status 11/05/2021 FINAL  Final  Culture, blood (Routine X 2) w Reflex to ID Panel     Status: None   Collection Time: 10/31/21   6:58 PM   Specimen: BLOOD  Result Value Ref Range Status   Specimen Description   Final    BLOOD BLOOD LEFT WRIST Performed at [place] [address], [place], [place] [address]    Special Requests   Final    IN PEDIATRIC BOTTLE Blood Culture adequate volume Performed at [place] [address], [place], [place] [address]    Culture   Final    NO GROWTH 5 DAYS Performed at [facility], [place] [address] [place], [place] [address]  Report Status 11/05/2021 FINAL  Final  Culture, blood (Routine X 2) w Reflex to ID Panel     Status: None   Collection Time: 11/14/21 11:30 AM   Specimen: BLOOD  Result Value Ref Range Status   Specimen Description   Final    BLOOD BLOOD LEFT HAND Performed at [facility] [address], [place], [place] [address]    Special Requests   Final    IN PEDIATRIC BOTTLE Blood Culture adequate volume Performed at [place] [address], [place], [place] [address]    Culture   Final    NO GROWTH 5 DAYS Performed at [facility], [place] [address], [place], [place] [address]    Report Status 11/19/2021 FINAL  Final  Culture, blood (Routine X 2) w Reflex to ID Panel     Status: None   Collection Time: 11/14/21 11:30 AM   Specimen: BLOOD  Result Value Ref Range Status   Specimen Description   Final    BLOOD BLOOD LEFT HAND Performed at [facility] [address] [place], [place] [address]    Special Requests   Final    IN PEDIATRIC BOTTLE Blood Culture adequate volume Performed at [place] [address], [place], [place] [address]    Culture   Final    NO GROWTH 5 DAYS Performed at [facility], [place] [address], [place], [place] [address]    Report Status 11/19/2021 FINAL  Final  Culture, blood (Routine X 2) w Reflex to ID Panel     Status: None   Collection Time: 11/23/21 10:51 AM   Specimen:  BLOOD  Result Value Ref Range Status   Specimen Description   Final    BLOOD RIGHT ANTECUBITAL Performed at [facility] [address] [place], [place] [address]    Special Requests   Final    IN PEDIATRIC BOTTLE Blood Culture adequate volume Performed at [facility] [place] [address] [place], [place] [address]    Culture   Final    NO GROWTH 5 DAYS Performed at [facility], [place] [address] [place], [place] [address]    Report Status 11/28/2021 FINAL  Final  Culture, blood (Routine X 2) w Reflex to ID Panel     Status: None   Collection Time: 11/23/21 11:48 AM   Specimen: BLOOD  Result Value Ref Range Status   Specimen Description   Final    BLOOD BLOOD RIGHT FOREARM Performed at [facility] [address], [place], [place] [address]    Special Requests   Final    BOTTLES DRAWN AEROBIC ONLY Blood Culture adequate volume Performed at [facility] [address] [place], [place] [address]    Culture   Final    NO GROWTH 5 DAYS Performed at [facility], [place] [address] [place],  [address]    Report Status 11/28/2021 FINAL  Final    Labs: Results for orders placed or performed during the hospital encounter of 10/22/21 (from the past 48 hour(s))  Glucose, capillary     Status: Abnormal   Collection Time: 11/26/21 11:49 AM  Result Value Ref Range   Glucose-Capillary 102 (H) 70 - 99 mg/dL    Comment: Glucose reference range applies only to samples taken after fasting for at least 8 hours.  Glucose, capillary     Status: Abnormal  Collection Time: 11/26/21  5:54 PM  Result Value Ref Range   Glucose-Capillary 127 (H) 70 - 99 mg/dL    Comment: Glucose reference range applies only to samples taken after fasting for at least 8 hours.  Glucose, capillary     Status: Abnormal   Collection Time: 11/26/21  9:29 PM  Result Value Ref Range   Glucose-Capillary 126 (H) 70 - 99 mg/dL     Comment: Glucose reference range applies only to samples taken after fasting for at least 8 hours.  CBC with Differential/Platelet     Status: Abnormal   Collection Time: 11/27/21  5:04 AM  Result Value Ref Range   WBC 6.9 4.0 - 10.5 K/uL   RBC 2.63 (L) 3.87 - 5.11 MIL/uL   Hemoglobin 7.9 (L) 12.0 - 15.0 g/dL   HCT 23.3 (L) 36.0 - 46.0 %   MCV 88.6 80.0 - 100.0 fL   MCH 30.0 26.0 - 34.0 pg   MCHC 33.9 30.0 - 36.0 g/dL   RDW 17.0 (H) 11.5 - 15.5 %   Platelets 186 150 - 400 K/uL   nRBC 0.0 0.0 - 0.2 %   Neutrophils Relative % 58 %   Neutro Abs 4.0 1.7 - 7.7 K/uL   Lymphocytes Relative 27 %   Lymphs Abs 1.8 0.7 - 4.0 K/uL   Monocytes Relative 11 %   Monocytes Absolute 0.8 0.1 - 1.0 K/uL   Eosinophils Relative 4 %   Eosinophils Absolute 0.3 0.0 - 0.5 K/uL   Basophils Relative 0 %   Basophils Absolute 0.0 0.0 - 0.1 K/uL   Immature Granulocytes 0 %   Abs Immature Granulocytes 0.02 0.00 - 0.07 K/uL    Comment: Performed at [facility], [place] [address], [place], [place] [address]  Comprehensive metabolic panel     Status: Abnormal   Collection Time: 11/27/21  5:04 AM  Result Value Ref Range   Sodium 131 (L) 135 - 145 mmol/L   Potassium 4.1 3.5 - 5.1 mmol/L   Chloride 100 98 - 111 mmol/L   CO2 24 22 - 32 mmol/L   Glucose, Bld 115 (H) 70 - 99 mg/dL    Comment: Glucose reference range applies only to samples taken after fasting for at least 8 hours.   BUN 38 (H) 6 - 20 mg/dL   Creatinine, Ser 0.81 0.44 - 1.00 mg/dL   Calcium 8.1 (L) 8.9 - 10.3 mg/dL   Total Protein 6.2 (L) 6.5 - 8.1 g/dL   Albumin 1.9 (L) 3.5 - 5.0 g/dL   AST 23 15 - 41 U/L   ALT 30 0 - 44 U/L   Alkaline Phosphatase 130 (H) 38 - 126 U/L   Total Bilirubin 0.7 0.3 - 1.2 mg/dL   GFR, Estimated >60 >60 mL/min    Comment: (NOTE) Calculated using the CKD-EPI Creatinine Equation (2021)    Anion gap 7 5 - 15    Comment: Performed at [facility], [place] [address],  [place], [place] [address]  Magnesium     Status: None   Collection Time: 11/27/21  5:04 AM  Result Value Ref Range   Magnesium 1.9 1.7 - 2.4 mg/dL    Comment: Performed at [facility], [place] [address] [place], [place] [address]  Phosphorus     Status: None   Collection Time: 11/27/21  5:04 AM  Result Value Ref Range   Phosphorus 4.2 2.5 - 4.6 mg/dL    Comment: Performed at [facility]  [place] [address], [place], [place] [address]  Glucose, capillary     Status: Abnormal   Collection Time: 11/27/21  7:19 AM  Result Value Ref Range   Glucose-Capillary 122 (H) 70 - 99 mg/dL    Comment: Glucose reference range applies only to samples taken after fasting for at least 8 hours.  Glucose, capillary     Status: Abnormal   Collection Time: 11/27/21  7:08 PM  Result Value Ref Range   Glucose-Capillary 119 (H) 70 - 99 mg/dL    Comment: Glucose reference range applies only to samples taken after fasting for at least 8 hours.  Triglycerides     Status: None   Collection Time: 11/28/21  5:04 AM  Result Value Ref Range   Triglycerides 85 <150 mg/dL    Comment: Performed at [facility], [place] [address], [place], [place] [address]  CBC with Differential/Platelet     Status: Abnormal   Collection Time: 11/28/21  5:04 AM  Result Value Ref Range   WBC 6.2 4.0 - 10.5 K/uL   RBC 2.73 (L) 3.87 - 5.11 MIL/uL   Hemoglobin 8.0 (L) 12.0 - 15.0 g/dL   HCT 24.2 (L) 36.0 - 46.0 %   MCV 88.6 80.0 - 100.0 fL   MCH 29.3 26.0 - 34.0 pg   MCHC 33.1 30.0 - 36.0 g/dL   RDW 16.9 (H) 11.5 - 15.5 %   Platelets 195 150 - 400 K/uL   nRBC 0.0 0.0 - 0.2 %   Neutrophils Relative % 52 %   Neutro Abs 3.2 1.7 - 7.7 K/uL   Lymphocytes Relative 32 %   Lymphs Abs 2.0 0.7 - 4.0 K/uL   Monocytes Relative 12 %   Monocytes Absolute 0.7 0.1 - 1.0 K/uL   Eosinophils Relative 4 %   Eosinophils Absolute 0.2 0.0 - 0.5 K/uL   Basophils Relative 0 %   Basophils Absolute 0.0 0.0 -  0.1 K/uL   Immature Granulocytes 0 %   Abs Immature Granulocytes 0.02 0.00 - 0.07 K/uL    Comment: Performed at [facility], [place] [address], [place], [place] [address]  Magnesium     Status: None   Collection Time: 11/28/21  5:04 AM  Result Value Ref Range   Magnesium 1.7 1.7 - 2.4 mg/dL    Comment: Performed at [facility], [place] [address], [place], [place] [address]  Phosphorus     Status: None   Collection Time: 11/28/21  5:04 AM  Result Value Ref Range   Phosphorus 4.0 2.5 - 4.6 mg/dL    Comment: Performed at [facility], [place] [address] [place], [place] [address]  Comprehensive metabolic panel     Status: Abnormal   Collection Time: 11/28/21  5:04 AM  Result Value Ref Range   Sodium 131 (L) 135 - 145 mmol/L   Potassium 4.3 3.5 - 5.1 mmol/L   Chloride 103 98 - 111 mmol/L   CO2 21 (L) 22 - 32 mmol/L   Glucose, Bld 111 (H) 70 - 99 mg/dL    Comment: Glucose reference range applies only to samples taken after fasting for at least 8 hours.   BUN 36 (H) 6 - 20 mg/dL   Creatinine, Ser 0.87 0.44 - 1.00 mg/dL   Calcium 8.2 (L) 8.9 - 10.3 mg/dL   Total Protein 6.1 (L) 6.5 - 8.1 g/dL   Albumin 1.9 (L) 3.5 - 5.0 g/dL   AST 21 15 -  41 U/L   ALT 31 0 - 44 U/L   Alkaline Phosphatase 129 (H) 38 - 126 U/L   Total Bilirubin 0.4 0.3 - 1.2 mg/dL   GFR, Estimated >60 >60 mL/min    Comment: (NOTE) Calculated using the CKD-EPI Creatinine Equation (2021)    Anion gap 7 5 - 15    Comment: Performed at [facility], [place] [address], [place], [place] [address]  Glucose, capillary     Status: Abnormal   Collection Time: 11/28/21  6:25 AM  Result Value Ref Range   Glucose-Capillary 111 (H) 70 - 99 mg/dL    Comment: Glucose reference range applies only to samples taken after fasting for at least 8 hours.    Imaging / Studies: No results found.  Medications / Allergies: per chart  Antibiotics: Anti-infectives (From  admission, onward)    Start     Dose/Rate Route Frequency Ordered Stop   11/14/21 0800  piperacillin-tazobactam (ZOSYN) IVPB 3.375 g        3.375 g 12.5 mL/hr over 240 Minutes Intravenous Every 8 hours 11/14/21 0739     11/04/21 1000  piperacillin-tazobactam (ZOSYN) IVPB 3.375 g        3.375 g 12.5 mL/hr over 240 Minutes Intravenous Every 8 hours 11/04/21 0935 11/11/21 1312   10/27/21 1600  cefTRIAXone (ROCEPHIN) 2 g in sodium chloride 0.9 % 100 mL IVPB  Status:  Discontinued        2 g 200 mL/hr over 30 Minutes Intravenous Every 24 hours 10/27/21 0843 10/27/21 1106   10/27/21 1600  piperacillin-tazobactam (ZOSYN) IVPB 3.375 g        3.375 g 12.5 mL/hr over 240 Minutes Intravenous Every 8 hours 10/27/21 1106 11/03/21 2000   10/27/21 1400  metroNIDAZOLE (FLAGYL) IVPB 500 mg  Status:  Discontinued        500 mg 100 mL/hr over 60 Minutes Intravenous Every 12 hours 10/27/21 0843 10/27/21 1106   10/24/21 1600  piperacillin-tazobactam (ZOSYN) IVPB 3.375 g  Status:  Discontinued        3.375 g 12.5 mL/hr over 240 Minutes Intravenous Every 8 hours 10/24/21 1422 10/27/21 0843   10/24/21 1000  cefTRIAXone (ROCEPHIN) 2 g in sodium chloride 0.9 % 100 mL IVPB  Status:  Discontinued        2 g 200 mL/hr over 30 Minutes Intravenous Every 24 hours 10/24/21 0747 10/24/21 1357   10/23/21 1530  vancomycin (VANCOCIN) IVPB 1000 mg/200 mL premix        1,000 mg 200 mL/hr over 60 Minutes Intravenous  Once 10/23/21 1439 10/23/21 1547   10/23/21 0200  ceFEPIme (MAXIPIME) 2 g in sodium chloride 0.9 % 100 mL IVPB  Status:  Discontinued        2 g 200 mL/hr over 30 Minutes Intravenous Every 12 hours 10/22/21 2032 10/24/21 0747   10/22/21 2200  cefOXitin (MEFOXIN) 2 g in sodium chloride 0.9 % 100 mL IVPB  Status:  Discontinued        2 g 200 mL/hr over 30 Minutes Intravenous Every 8 hours 10/22/21 1706 10/22/21 2032   10/22/21 1956  vancomycin variable dose per unstable renal function (pharmacist dosing)   Status:  Discontinued         Does not apply See admin instructions 10/22/21 1957 10/23/21 1917   10/22/21 1245  vancomycin (VANCOCIN) IVPB 1000 mg/200 mL premix        1,000 mg 200 mL/hr over 60 Minutes Intravenous  Once 10/22/21 1241  10/22/21 1402   10/22/21 1245  ceFEPIme (MAXIPIME) 2 g in sodium chloride 0.9 % 100 mL IVPB        2 g 200 mL/hr over 30 Minutes Intravenous  Once 10/22/21 1241 10/22/21 1321   10/22/21 1230  metroNIDAZOLE (FLAGYL) IVPB 500 mg  Status:  Discontinued        500 mg 100 mL/hr over 60 Minutes Intravenous Every 12 hours 10/22/21 1222 10/24/21 1357         Note: Portions of this report may have been transcribed using voice recognition software. Every effort was made to ensure accuracy; however, inadvertent computerized transcription errors may be present.   Any transcriptional errors that result from this process are unintentional.    Adin Hector, MD, FACS, MASCRS Esophageal, Gastrointestinal & Colorectal Surgery Robotic and Minimally Invasive Surgery  [facility], [place]  [place], [place], [place] [address] [phone] Fax [phone] Main  CONTACT INFORMATION:  Weekday (9AM-5PM): Call [facility] main office at [phone]  Weeknight (5PM-9AM) or Weekend/Holiday: Check [email or web address] (password " [email or web address]") for General Surgery [facility] coverage  (Please, do not use SecureChat as it is not reliable communication to operating surgeons for immediate patient care)      11/28/2021  10:08 AM

## 2021-11-28 NOTE — Progress Notes (Signed)
PROGRESS NOTE    Jessica [name]  WYS:[id number] DOB: 29-Nov-1960 DOA: 10/22/2021 PCP: Deland Pretty, MD   Brief Narrative:  61 year old female with history of alcohol abuse, several alcohol withdrawals requiring admissions, tobacco use presented on 10/22/2021 with diffuse abdominal pain with CT of abdomen and pelvis showed pneumoperitoneum and free fluid.  She was started on broad-spectrum antibiotics and emergently taken to the OR and underwent exploratory laparotomy where she was found to have a perforated sigmoid colon.  She is status post Hartmann procedure and insertion of G-tube.  She was found to have Klebsiella bacteremia.  Hospital course complicated by development of left lower quadrant fluid collection status post IR drainage on 10/29/2021, drain cultures grew Pseudomonas.  This required having her drain adjusted and upsized on 510, fluid appeared feculent.  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  She had a repeat CT on 11/10/2021 with pelvic fluid collection measuring 6 x 3.2 cm.  This was discussed with IR and it was not felt safe or amenable to drainage.  She was continued on IV antibiotics.  ID was consulted.  Repeat CT scan showed stable pelvic abscess also remaining unable to be drained.  She is currently on TPN.  Palliative care was also consulted for goals of care discussion.  Currently remains full code.  Assessment & Plan:   Complicated intra-abdominal infection Perforated sigmoid colon status post expiratory laparotomy/Hartman's procedure/G-tube placement Septic shock: Present on admission, resolved Klebsiella bacteremia [facility] course as above. -As per recent ID recommendations, patient has been on IV Zosyn: Undetermined prolonged period given uncontrolled intra-abdominal infection with abscess not amenable to drainage. -General surgery following.  Continue TPN.  -Repeat blood cultures from 11/23/2021: Negative so far.   Wound care as per general surgery and wound care nurse recommendations. General surgery repeated CT of the abdomen on 11/23/2021 which showed decreasing collections  -Septic shock has already resolved.  Off pressors -Tmax 100.4 over the last 24 hours.  Acute respiratory failure with hypoxia/aspiration pneumonia -Has required intubation and extubation twice.  Extubated again on 11/08/2021.  Currently on room air.  Acute metabolic encephalopathy -Multifactorial due to ICU delirium/infectious etiology/alcohol withdrawal -Mental status has mostly improved.  Monitor mental status. -Fall precautions  AKI -Resolved.  Essential hypertension -Monitor blood pressure.  Continue metoprolol and hydralazine.  Anemia of chronic disease -Has required 4 units packed red cells transfusion during the hospitalization with last transfusion being on 11/24/2021 for hemoglobin of 6.9. -Hemoglobin 8 today.  Monitor H&H.  Hyponatremia -Sodium improving today to 131; has received 3 doses of IV Lasix since 11/25/2021.  We will hold off on further Lasix today.  Currently on TPN.  Monitor.  Continue salt tablets for today.  Monitor.  Severe protein calorie malnutrition/hypoalbuminemia Anorexia -Due to poor oral intake.  Continue TPN.  Nutrition following. -Currently on Megace as well  Hypomagnesemia -Resolved  Substance-induced mood disorder/anxiety -Continue lorazepam as needed.  Physical deconditioning -Will need SNF placement  DVT prophylaxis: Heparin subcutaneous Code Status: Full Family Communication: None at bedside Disposition Plan: Status is: Inpatient Remains inpatient appropriate because: Of severity of illness    Consultants: General surgery/palliative care/ID/PCCM/IR  Procedures: As above  Antimicrobials:  Anti-infectives (From admission, onward)    Start     Dose/Rate Route Frequency Ordered Stop   11/14/21 0800  piperacillin-tazobactam (ZOSYN) IVPB 3.375 g        3.375 g 12.5  mL/hr over 240 Minutes Intravenous Every 8  hours 11/14/21 0739     11/04/21 1000  piperacillin-tazobactam (ZOSYN) IVPB 3.375 g        3.375 g 12.5 mL/hr over 240 Minutes Intravenous Every 8 hours 11/04/21 0935 11/11/21 1312   10/27/21 1600  cefTRIAXone (ROCEPHIN) 2 g in sodium chloride 0.9 % 100 mL IVPB  Status:  Discontinued        2 g 200 mL/hr over 30 Minutes Intravenous Every 24 hours 10/27/21 0843 10/27/21 1106   10/27/21 1600  piperacillin-tazobactam (ZOSYN) IVPB 3.375 g        3.375 g 12.5 mL/hr over 240 Minutes Intravenous Every 8 hours 10/27/21 1106 11/03/21 2000   10/27/21 1400  metroNIDAZOLE (FLAGYL) IVPB 500 mg  Status:  Discontinued        500 mg 100 mL/hr over 60 Minutes Intravenous Every 12 hours 10/27/21 0843 10/27/21 1106   10/24/21 1600  piperacillin-tazobactam (ZOSYN) IVPB 3.375 g  Status:  Discontinued        3.375 g 12.5 mL/hr over 240 Minutes Intravenous Every 8 hours 10/24/21 1422 10/27/21 0843   10/24/21 1000  cefTRIAXone (ROCEPHIN) 2 g in sodium chloride 0.9 % 100 mL IVPB  Status:  Discontinued        2 g 200 mL/hr over 30 Minutes Intravenous Every 24 hours 10/24/21 0747 10/24/21 1357   10/23/21 1530  vancomycin (VANCOCIN) IVPB 1000 mg/200 mL premix        1,000 mg 200 mL/hr over 60 Minutes Intravenous  Once 10/23/21 1439 10/23/21 1547   10/23/21 0200  ceFEPIme (MAXIPIME) 2 g in sodium chloride 0.9 % 100 mL IVPB  Status:  Discontinued        2 g 200 mL/hr over 30 Minutes Intravenous Every 12 hours 10/22/21 2032 10/24/21 0747   10/22/21 2200  cefOXitin (MEFOXIN) 2 g in sodium chloride 0.9 % 100 mL IVPB  Status:  Discontinued        2 g 200 mL/hr over 30 Minutes Intravenous Every 8 hours 10/22/21 1706 10/22/21 2032   10/22/21 1956  vancomycin variable dose per unstable renal function (pharmacist dosing)  Status:  Discontinued         Does not apply See admin instructions 10/22/21 1957 10/23/21 1917   10/22/21 1245  vancomycin (VANCOCIN) IVPB 1000 mg/200 mL premix         1,000 mg 200 mL/hr over 60 Minutes Intravenous  Once 10/22/21 1241 10/22/21 1402   10/22/21 1245  ceFEPIme (MAXIPIME) 2 g in sodium chloride 0.9 % 100 mL IVPB        2 g 200 mL/hr over 30 Minutes Intravenous  Once 10/22/21 1241 10/22/21 1321   10/22/21 1230  metroNIDAZOLE (FLAGYL) IVPB 500 mg  Status:  Discontinued        500 mg 100 mL/hr over 60 Minutes Intravenous Every 12 hours 10/22/21 1222 10/24/21 1357        Subjective: Patient seen and examined at bedside.  Poor historian.  Denies worsening abdominal pain, chest pain, vomiting.  Oral intake improving. Objective: Vitals:   11/27/21 1844 11/27/21 2000 11/28/21 0500 11/28/21 0521  BP:  (!) 143/79  (!) 142/73  Pulse:  (!) 102  90  Resp:  (!) 21  17  Temp: (!) 100.4 F (38 C) 100.2 F (37.9 C)  98.4 F (36.9 C)  TempSrc: Oral Oral  Oral  SpO2:  97%  99%  Weight:   65 kg   Height:        Intake/Output Summary (Last  24 hours) at 11/28/2021 0740 Last data filed at 11/28/2021 0518 Gross per 24 hour  Intake 3123.89 ml  Output 4475 ml  Net -1351.11 ml    Filed Weights   11/21/21 0500 11/27/21 0500 11/28/21 0500  Weight: 60.8 kg 70.8 kg 65 kg    Examination:  General: Still on room air.  No distress.  Chronically ill and deconditioned looking.   ENT/neck: No palpable neck masses or JVD elevation respiratory: Has intermittent tachypnea with scattered bilateral crackles  CVS: Has intermittent tachycardia; S1-S2 heard abdominal: Soft, still tender diffusely; no rebound tenderness; slightly distended; no organomegaly, normal bowel sounds are heard.  Ostomy and Eakin's pouch present Extremities: Slight lower extremity edema present; no clubbing  CNS: Alert.  No focal neurologic deficit.  Is able to move extremities lymph: No palpable cervical lymphadenopathy  skin: No obvious petechiae/rashes psych: Does not show signs of agitation.  Affect is mostly flat musculoskeletal: No obvious joint  swelling/deformity   Data Reviewed: I have personally reviewed following labs and imaging studies  CBC: Recent Labs  Lab 11/24/21 0323 11/25/21 0040 11/25/21 0046 11/26/21 0341 11/27/21 0504 11/28/21 0504  WBC 6.8 7.0  --  5.9 6.9 6.2  NEUTROABS 3.8 4.0  --  3.1 4.0 3.2  HGB 6.9* 8.0* 8.1* 7.5* 7.9* 8.0*  HCT 21.3* 24.5* 24.1* 22.6* 23.3* 24.2*  MCV 90.6 89.7  --  89.0 88.6 88.6  PLT 183 181  --  180 186 092    Basic Metabolic Panel: Recent Labs  Lab 11/24/21 0323 11/25/21 0040 11/26/21 0341 11/27/21 0504 11/28/21 0504  NA 130* 125* 129* 131* 131*  K 4.5 4.5 4.0 4.1 4.3  CL 102 98 97* 100 103  CO2 '22 22 26 24 '$ 21*  GLUCOSE 115* 118* 111* 115* 111*  BUN 30* 31* 36* 38* 36*  CREATININE 0.86 0.78 0.95 0.81 0.87  CALCIUM 7.5* 7.5* 7.7* 8.1* 8.2*  MG 1.8 1.7 2.2 1.9 1.7  PHOS 3.9 3.6 3.8 4.2 4.0    GFR: Estimated Creatinine Clearance: 70.6 mL/min (by C-G formula based on SCr of 0.87 mg/dL). Liver Function Tests: Recent Labs  Lab 11/24/21 0323 11/25/21 0040 11/26/21 0341 11/27/21 0504 11/28/21 0504  AST '17 25 24 23 21  '$ ALT '21 27 29 30 31  '$ ALKPHOS 81 115 130* 130* 129*  BILITOT 0.6 0.5 0.6 0.7 0.4  PROT 5.5* 5.9* 5.6* 6.2* 6.1*  ALBUMIN 1.7* 1.9* 1.8* 1.9* 1.9*    No results for input(s): LIPASE, AMYLASE in the last 168 hours. No results for input(s): AMMONIA in the last 168 hours. Coagulation Profile: No results for input(s): INR, PROTIME in the last 168 hours. Cardiac Enzymes: No results for input(s): CKTOTAL, CKMB, CKMBINDEX, TROPONINI in the last 168 hours. BNP (last 3 results) No results for input(s): PROBNP in the last 8760 hours. HbA1C: No results for input(s): HGBA1C in the last 72 hours. CBG: Recent Labs  Lab 11/26/21 1754 11/26/21 2129 11/27/21 0719 11/27/21 1908 11/28/21 0625  GLUCAP 127* 126* 122* 119* 111*    Lipid Profile: Recent Labs    11/28/21 0504  TRIG 85    Thyroid Function Tests: No results for input(s): TSH,  T4TOTAL, FREET4, T3FREE, THYROIDAB in the last 72 hours. Anemia Panel: No results for input(s): VITAMINB12, FOLATE, FERRITIN, TIBC, IRON, RETICCTPCT in the last 72 hours. Sepsis Labs: No results for input(s): PROCALCITON, LATICACIDVEN in the last 168 hours.  Recent Results (from the past 240 hour(s))  Culture, blood (Routine X 2) w Reflex  to ID Panel     Status: None   Collection Time: 11/23/21 10:51 AM   Specimen: BLOOD  Result Value Ref Range Status   Specimen Description   Final    BLOOD RIGHT ANTECUBITAL Performed at [facility] [address], [place], [place] [address]    Special Requests   Final    IN PEDIATRIC BOTTLE Blood Culture adequate volume Performed at [facility] [address] [place], [place] [address]    Culture   Final    NO GROWTH 5 DAYS Performed at [facility], [place] [address] [place], [place] [address]    Report Status 11/28/2021 FINAL  Final  Culture, blood (Routine X 2) w Reflex to ID Panel     Status: None   Collection Time: 11/23/21 11:48 AM   Specimen: BLOOD  Result Value Ref Range Status   Specimen Description   Final    BLOOD BLOOD RIGHT FOREARM Performed at [facility] [address], [place], [place] [address]    Special Requests   Final    BOTTLES DRAWN AEROBIC ONLY Blood Culture adequate volume Performed at [facility] [address] [place], [place] [address]    Culture   Final    NO GROWTH 5 DAYS Performed at [facility], [place] [address], [place],  [address]    Report Status 11/28/2021 FINAL  Final          Radiology Studies: No results found.      Scheduled Meds:  vitamin C  500 mg Per Tube BID   chlorhexidine  15 mL Mouth Rinse BID   Chlorhexidine Gluconate Cloth  6 each Topical Daily   enoxaparin (LOVENOX) injection  40 mg Subcutaneous Q24H   feeding supplement  237 mL Oral BID BM   mouth rinse  15 mL Mouth Rinse  q12n4p   megestrol  400 mg Oral BID   metoprolol tartrate  50 mg Oral BID   nicotine  14 mg Transdermal Daily   pantoprazole (PROTONIX) IV  40 mg Intravenous Q24H   sodium chloride flush  10-40 mL Intracatheter Q12H   sodium chloride flush  5 mL Intracatheter Q12H   sodium chloride flush  5 mL Intracatheter Q8H   sodium chloride  1 g Oral BID WC   vitamin A  10,000 Units Per Tube Daily   Continuous Infusions:  sodium chloride 250 mL (11/22/21 0019)   piperacillin-tazobactam (ZOSYN)  IV 3.375 g (11/28/21 0017)   TPN ADULT (ION) 70 mL/hr at 11/28/21 0518          Aline August, MD [facility] 11/28/2021, 7:40 AM

## 2021-11-28 NOTE — Progress Notes (Signed)
[facility] for Infectious Disease  Date of Admission:  10/22/2021           Reason for visit: Follow up on intra abdominal infection  Current antibiotics: Zosyn 4/17-5/5; 5/8 - present   Prior antibiotics: Ceftriaxone 4/17 Cefepime 4/15 - 4/17 Metronidazole 4/15 - 4/17 Vancomycin 4/15 - 4/16    ASSESSMENT:    61 y.o. female admitted with:  Complicated intra-abdominal infection: Patient presented with septic shock due to Klebsiella bacteremia from a perforated sigmoid colon on 4/15.  Status post Hartman/colostomy procedure with G-tube placement at that time.  This was complicated by colocutaneous fistula formation and intra-abdominal abscess.  Status post IR drain placement into this fluid collection 4/22 with cultures growing Pseudomonas.  Another CT scan obtained 5/4 notable for a second pelvic fluid collection measuring 6 x 3.2 cm unable to be drained by IR.  On 5/10, she underwent drain upsize into the peri-stomal collection yielding feculent debris.  This residual abscess cavity was noted to have a persistent fistulous connection with her open midline abdominal wound.  She had repeat CT 5/17 showing resolution of fluid collection with catheter in place.  Her undrained pelvic collection was decreased in size now measuing 2.8x5.2x3.8cm.  She has other enhancing fluid collections in the presacral space and pelvic sidewall that were not signficantly changed in size.    RECOMMENDATIONS:    No new recs today Continue piptazo Continue wound care Still on tpn at risk for candida infection, although was recently started on soft enteral feeding. Discussed with primary team    I spent more than 35 minute reviewing data/chart, and coordinating care and >50% direct face to face time providing counseling/discussing diagnostics/treatment plan with patient    Principal Problem:   Severe sepsis due to perforated sigmoid colon s/p Hartmann/colostomy 10/22/2021 Active Problems:    Substance induced mood disorder (HCC)   Anxiety   Chronic pain   Primary insomnia   Tobacco use disorder   Underweight   Protein-calorie malnutrition, severe (HCC)   Colostomy in place ([place])   AKI (acute kidney injury) ([place])   Stricture and stenosis of esophagus   Gastrostomy tube in place [facility])   Acute respiratory failure with hypoxia ([place])   Acute metabolic encephalopathy   Hypernatremia, hyponatremia, hypokalemia, hyperphosphatemia   Hyponatremia   Elevated brain natriuretic peptide (BNP) level   Right proximal humeral fracture   Oropharyngeal dysphagia   Normocytic anemia   Palliative care by specialist   Goals of care, counseling/discussion   General weakness   Septic shock ([place])   Postoperative intra-abdominal abscess   Colocutaneous fistula   Generalized abdominal pain   Aspiration pneumonia ([place])   Bacteremia due to Klebsiella pneumoniae   Acute encephalopathy   Humerus fracture   Pelvic abscess - pseudomonas - s/p percutaneous drainage 10/29/2021    MEDICATIONS:    Scheduled Meds:  acetaminophen (TYLENOL) oral liquid 160 mg/5 mL  1,000 mg Per Tube Q6H   vitamin C  500 mg Per Tube BID   chlorhexidine  15 mL Mouth Rinse BID   Chlorhexidine Gluconate Cloth  6 each Topical Daily   enoxaparin (LOVENOX) injection  40 mg Subcutaneous Q24H   feeding supplement  237 mL Per Tube Q8H   ferrous sulfate  325 mg Oral BID WC   fiber  1 packet Per Tube BID   gabapentin  300 mg Oral QHS   iohexol  500 mL Oral Q1H   iohexol  lip balm  1 application. Topical BID   loperamide  2 mg Oral BID   mouth rinse  15 mL Mouth Rinse q12n4p   megestrol  400 mg Oral BID   metoprolol tartrate  50 mg Per Tube BID   nicotine  14 mg Transdermal Daily   pantoprazole sodium  40 mg Per Tube Daily   sodium chloride flush  10-40 mL Intracatheter Q12H   sodium chloride  1 g Per Tube BID WC   vitamin A  10,000 Units Per Tube Daily   Continuous Infusions:  sodium chloride 250 mL  (11/22/21 0019)   ferric gluconate (FERRLECIT) IVPB     magnesium sulfate bolus IVPB     methocarbamol (ROBAXIN) IV     piperacillin-tazobactam (ZOSYN)  IV 3.375 g (11/28/21 0925)   TPN ADULT (ION) 70 mL/hr at 11/28/21 0518   TPN ADULT (ION)     PRN Meds:.alum & mag hydroxide-simeth, antiseptic oral rinse, diphenhydrAMINE, diphenhydrAMINE, enalaprilat, fentaNYL (SUBLIMAZE) injection, hydrALAZINE, ipratropium-albuterol, lidocaine, LORazepam, magic mouthwash, menthol-cetylpyridinium, methocarbamol (ROBAXIN) IV, methocarbamol, metoprolol tartrate, ondansetron (ZOFRAN) IV, oxyCODONE, phenol, prochlorperazine, simethicone, sodium chloride  SUBJECTIVE:   24 hour events:  No significant change over the weekend Patient getting hygiene care at this time No fever/chill Still on tpn Labs without hepatitis or aki or leukocytosis  Review of Systems  All other systems reviewed and are negative.    OBJECTIVE:   Blood pressure 133/78, pulse 99, temperature 99.4 F (37.4 C), temperature source Oral, resp. rate 20, height '5\' 10"'$  (1.778 m), weight 65 kg, SpO2 98 %. Body mass index is 20.56 kg/m.  Physical exam: General: No distress; interactive; Getting bed bath/cleaning Heent: normocephalic Pulm: normal respiratory effort Neuro: nonfocal Skin: no rash    Lab Results: Lab Results  Component Value Date   WBC 6.2 11/28/2021   HGB 8.0 (L) 11/28/2021   HCT 24.2 (L) 11/28/2021   MCV 88.6 11/28/2021   PLT 195 11/28/2021    Lab Results  Component Value Date   NA 131 (L) 11/28/2021   K 4.3 11/28/2021   CO2 21 (L) 11/28/2021   GLUCOSE 111 (H) 11/28/2021   BUN 36 (H) 11/28/2021   CREATININE 0.87 11/28/2021   CALCIUM 8.2 (L) 11/28/2021   GFRNONAA >60 11/28/2021   GFRAA >60 12/12/2017    Lab Results  Component Value Date   ALT 31 11/28/2021   AST 21 11/28/2021   ALKPHOS 129 (H) 11/28/2021   BILITOT 0.4 11/28/2021    No results found for: CRP  No results found for: ESRSEDRATE    I have reviewed the micro and lab results in Epic.  Imaging: No results found.   Imaging independently reviewed in Epic.    5/17 ct abd pelv with contrast 1. Left-sided pigtail catheter in place. Previously identified fluid collection in this region has resolved. 2. Central pelvic enhancing fluid collection has decreased in size. Other pelvic fluid collections are stable. 3. Diffusely dilated small bowel containing contrast worrisome for small bowel ileus or partial distal small bowel obstruction. 4. Increasing body wall edema. 5. Again seen is a left lower quadrant colostomy. 6. Increasing bilateral pleural effusions. 7. Cholelithiasis with stable gallbladder wall edema. Correlate clinically for cholecystitis. 8.  Aortic Atherosclerosis     Jabier Mutton, [facility] for Infectious [place] [phone]  pager   [phone] cell 11/28/2021, 1:42 PM

## 2021-11-28 NOTE — Progress Notes (Signed)
PHARMACY - TOTAL PARENTERAL NUTRITION CONSULT NOTE   Indication: Prolonged ileus  Patient Measurements: Height: '5\' 10"'  (177.8 cm) Weight: 65 kg (143 lb 4.8 oz) IBW/kg (Calculated) : 68.5 TPN AdjBW (KG): 53.8 Body mass index is 20.56 kg/m. Usual Weight: 53.8 kg  Assessment: 61 yo F w/ PMH etoh use disorder, smoker, HTN, GERD. S/p Ex Lap Hartmans 10/22/2021 for perforated sigmoid colon.  Glucose / Insulin: no hx DM; CBGs continue to be well controlled after stopping SSI Electrolytes:  - Na low but increased despite reduction in daily dose (d/t concentrating TPN) suggesting more related to volume overload than actual Na deficit  - magnesium slightly low at 1.7  - Co2 slight trend down to 21 - others stable WNL Vitamins:   - Vitamin C <0.1 (4/22); receiving 200 mg ascorbic acid daily in TPN from [facility] - Vitamin A 14.2 (4/22); receiving 1 mg of vitamin A daily in TPN from [facility] - Unable to add additional ascorbic acid and vitamin A to TPN per TPN policy - 5/5 - initiated ascorbic acid 500 mg per tube BID and vitamin A 10,000 units per tube daily. Tube to be clamped x1 hour after administration then returned to suction.  Renal: SCr stable WNL; BUN elevated ; UOP 4150 mL In last 24 hr, received Lasix     Hepatic: LFTs, Tbil are WNL,  Alk Phos borderline elevated; TG stable WNL.  - Albumin remains low - prealbumin 18.2 on 5/13  Intake / Output:  - no MIVF GI Imaging:  - 4/15 CTA: bowel perforation - 4/17 KUB ileus or pSBO - 4/20 CTA: severe ileus or pSBO - 5/04 CT: new pelvic collection not amenable to drainage, likely an abscess - 5/11 CTa/p: fluid collections unchanged from 5/4 - 5/17 abd CT: Central pelvic enhancing fluid collection has decreased in size. Diffusely dilated small bowel containing contrast worrisome for small bowel ileus or partial distal small bowel obstruction. Increasing bilateral pleural effusions. GI Surgeries / Procedures:  - 10/22/21 s/p EXPLORATORY LAPAROTOMY  hartmans for perforated sigmoid colon - 4/23 IR placed drain LLQ - 5/10 Drainage cath exchanged  Central access: CVC TL placed 10/23/21 TPN start date: 10/26/21  Nutritional Goals: - goal for ileus: Mag > 2, K > 4, phos ~3 RD Assessment: (5/10) Estimated Needs Total Energy Estimated Needs: 2000-2200 kcal Total Protein Estimated Needs: 105-125 grams Total Fluid Estimated Needs: >/= 2.2 L/day  Current Nutrition:  Soft diet, minimal intake (< 50%) Refusing all supplements Goal concentrated TPN rate is 70 mL/hr (provides 124 g of protein and 2040 kcals per day - 100% of goal)   On 5/22 Dr. Johney Maine updated consult to state  "If patient having greater than 50% calories p.o. and through gastrostomy tube, cut TPN in half.  If meeting 100% goals between p.o. and enteral nutrition, can wean off TPN over 48 hours"   Plan:  Given fluid overload requiring diuresis, concentrating TPN as able.   Now Magnesium 2 gr IV x1   At 18:00:   Does not seem that >50% of calories are being obtained though PO route, will continue TPN  Continue concentrated TPN with clinisol at 70 mL/hr  Electrolytes in TPN: no changes from yesterday Na 154 mEq/L K 55 mEq/L Ca 4 mEq/L Mg 10 mEq/L (cannot incr further d/t stability/compatibility concerns) Phos 2 mmol/L Cl:Ac 2:1 Continue standard MVI and trace elements to TPN Continue folic acid & thiamine in TPN BMP, magnesium, phosphorus with AM labs  Monitor TPN labs on Mon/Thurs  Follow up oral intake for appropriate wean off TPN    Royetta Asal, PharmD, BCPS 11/28/2021 10:51 AM

## 2021-11-29 ENCOUNTER — Inpatient Hospital Stay (HOSPITAL_COMMUNITY): Payer: Self-pay

## 2021-11-29 ENCOUNTER — Encounter (HOSPITAL_COMMUNITY): Payer: Self-pay | Admitting: Student

## 2021-11-29 DIAGNOSIS — K651 Peritoneal abscess: Secondary | ICD-10-CM

## 2021-11-29 LAB — BASIC METABOLIC PANEL
Anion gap: 5 (ref 5–15)
BUN: 35 mg/dL — ABNORMAL HIGH (ref 6–20)
CO2: 19 mmol/L — ABNORMAL LOW (ref 22–32)
Calcium: 7.9 mg/dL — ABNORMAL LOW (ref 8.9–10.3)
Chloride: 107 mmol/L (ref 98–111)
Creatinine, Ser: 0.87 mg/dL (ref 0.44–1.00)
GFR, Estimated: >60 mL/min (ref >60)
Glucose, Bld: 113 mg/dL — ABNORMAL HIGH (ref 70–99)
Potassium: 4.6 mmol/L (ref 3.5–5.1)
Sodium: 131 mmol/L — ABNORMAL LOW (ref 135–145)

## 2021-11-29 LAB — URINALYSIS, ROUTINE W REFLEX MICROSCOPIC
Bacteria, UA: NONE SEEN
Bilirubin Urine: NEGATIVE
Glucose, UA: NEGATIVE mg/dL
Hgb urine dipstick: NEGATIVE
Ketones, ur: NEGATIVE mg/dL
Nitrite: NEGATIVE
Protein, ur: NEGATIVE mg/dL
Specific Gravity, Urine: 1.015 (ref 1.005–1.030)
pH: 7 (ref 5.0–8.0)

## 2021-11-29 LAB — PHOSPHORUS: Phosphorus: 3.6 mg/dL (ref 2.5–4.6)

## 2021-11-29 LAB — PREALBUMIN: Prealbumin: 20.9 mg/dL (ref 18–38)

## 2021-11-29 LAB — GLUCOSE, CAPILLARY
Glucose-Capillary: 128 mg/dL — ABNORMAL HIGH (ref 70–99)
Glucose-Capillary: 122 mg/dL — ABNORMAL HIGH (ref 70–99)
Glucose-Capillary: 118 mg/dL — ABNORMAL HIGH (ref 70–99)

## 2021-11-29 LAB — MAGNESIUM: Magnesium: 1.9 mg/dL (ref 1.7–2.4)

## 2021-11-29 MED ORDER — PANTOPRAZOLE 2 MG/ML SUSPENSION
40.0000 mg | Freq: Every day | ORAL | Status: DC
  Administered 2021-11-30: 40 mg via ORAL
  Filled 2021-11-29: qty 20

## 2021-11-29 MED ORDER — SODIUM CHLORIDE 1 G PO TABS
1.0000 g | ORAL_TABLET | Freq: Two times a day (BID) | ORAL | Status: DC
  Administered 2021-11-29 – 2021-12-03 (×8): 1 g via ORAL
  Filled 2021-11-29 (×9): qty 1

## 2021-11-29 MED ORDER — VANCOMYCIN HCL 1250 MG/250ML IV SOLN
1250.0000 mg | Freq: Once | INTRAVENOUS | Status: AC
  Administered 2021-11-29: 1250 mg via INTRAVENOUS
  Filled 2021-11-29: qty 250

## 2021-11-29 MED ORDER — FERROUS SULFATE 325 (65 FE) MG PO TABS
325.0000 mg | ORAL_TABLET | Freq: Two times a day (BID) | ORAL | Status: DC
  Administered 2021-11-29 – 2021-12-06 (×15): 325 mg via ORAL
  Filled 2021-11-29 (×15): qty 1

## 2021-11-29 MED ORDER — VANCOMYCIN HCL 750 MG/150ML IV SOLN
750.0000 mg | Freq: Two times a day (BID) | INTRAVENOUS | Status: DC
  Administered 2021-11-30 – 2021-12-01 (×3): 750 mg via INTRAVENOUS
  Filled 2021-11-29 (×3): qty 150

## 2021-11-29 MED ORDER — METOPROLOL TARTRATE 25 MG/10 ML ORAL SUSPENSION
50.0000 mg | Freq: Two times a day (BID) | ORAL | Status: DC
  Administered 2021-11-29 – 2021-12-06 (×14): 50 mg via ORAL
  Filled 2021-11-29 (×16): qty 20

## 2021-11-29 MED ORDER — NUTRISOURCE FIBER PO PACK
1.0000 | PACK | Freq: Two times a day (BID) | ORAL | Status: DC
  Filled 2021-11-29: qty 1

## 2021-11-29 MED ORDER — ASCORBIC ACID 500 MG PO TABS
500.0000 mg | ORAL_TABLET | Freq: Two times a day (BID) | ORAL | Status: DC
  Administered 2021-11-29 – 2021-12-06 (×14): 500 mg via ORAL
  Filled 2021-11-29 (×14): qty 1

## 2021-11-29 MED ORDER — NUTRISOURCE FIBER PO PACK
1.0000 | PACK | Freq: Two times a day (BID) | ORAL | Status: DC
  Administered 2021-11-29 – 2021-12-01 (×5): 1 via ORAL
  Filled 2021-11-29 (×7): qty 1

## 2021-11-29 MED ORDER — VITAMIN A 3 MG (10000 UNIT) PO CAPS
10000.0000 [IU] | ORAL_CAPSULE | Freq: Every day | ORAL | Status: DC
  Administered 2021-11-30 – 2021-12-06 (×7): 10000 [IU] via ORAL
  Filled 2021-11-29 (×7): qty 1

## 2021-11-29 MED ORDER — ACETAMINOPHEN 325 MG PO TABS
650.0000 mg | ORAL_TABLET | Freq: Four times a day (QID) | ORAL | Status: DC | PRN
  Administered 2021-11-29 – 2021-12-05 (×2): 650 mg via ORAL
  Filled 2021-11-29 (×3): qty 2

## 2021-11-29 MED ORDER — LOPERAMIDE HCL 2 MG PO CAPS
2.0000 mg | ORAL_CAPSULE | Freq: Two times a day (BID) | ORAL | Status: DC
  Administered 2021-11-29 – 2021-12-01 (×5): 2 mg via ORAL
  Filled 2021-11-29 (×5): qty 1

## 2021-11-29 MED ORDER — TRACE MINERALS CU-MN-SE-ZN 300-55-60-3000 MCG/ML IV SOLN
INTRAVENOUS | Status: AC
  Filled 2021-11-29: qty 473.6

## 2021-11-29 MED ORDER — SODIUM CHLORIDE (PF) 0.9 % IJ SOLN
INTRAMUSCULAR | Status: AC
  Administered 2021-11-30: 10 mL
  Filled 2021-11-29: qty 50

## 2021-11-29 MED ORDER — IOHEXOL 300 MG/ML  SOLN
80.0000 mL | Freq: Once | INTRAMUSCULAR | Status: AC | PRN
  Administered 2021-11-29: 80 mL via INTRAVENOUS

## 2021-11-29 NOTE — Progress Notes (Signed)
   11/29/21 1607  Assess: MEWS Score  Temp (!) 102.1 F (38.9 C)  BP (!) 170/80  Pulse Rate (!) 118  Resp (!) 22  Level of Consciousness Alert  SpO2 100 %  O2 Device Room Air  Assess: MEWS Score  MEWS Temp 2  MEWS Systolic 0  MEWS Pulse 2  MEWS RR 1  MEWS LOC 0  MEWS Score 5  MEWS Score Color Red  Assess: if the MEWS score is Yellow or Red  Were vital signs taken at a resting state? Yes  Focused Assessment Change from prior assessment (see assessment flowsheet)  Does the patient meet 2 or more of the SIRS criteria? Yes  Does the patient have a confirmed or suspected source of infection? Yes  Provider and Rapid Response Notified? Yes  MEWS guidelines implemented *See Row Information* Yes  Treat  MEWS Interventions Administered scheduled meds/treatments;Escalated (See documentation below);Administered prn meds/treatments  Take Vital Signs  Increase Vital Sign Frequency  Red: Q 1hr X 4 then Q 4hr X 4, if remains red, continue Q 4hrs  Escalate  MEWS: Escalate Red: discuss with charge nurse/RN and provider, consider discussing with RRT  Notify: Charge Nurse/RN  Name of Charge Nurse/RN Notified Hinton Dyer RN  Date Charge Nurse/RN Notified 11/29/21  Time Charge Nurse/RN Notified 1736  Notify: Provider  Provider Name/Title Dr. Cruzita Lederer  Date Provider Notified 11/29/21  Time Provider Notified 1608  Method of Notification Face-to-face  Notification Reason Change in status  Provider response At bedside;See new orders  Date of Provider Response 11/29/21  Time of Provider Response 1608  Notify: Rapid Response  Name of Rapid Response RN Notified Pamala Hurry RN  Date Rapid Response Notified 11/29/21  Time Rapid Response Notified 7782  Document  Patient Outcome Transferred/level of care increased  Assess: SIRS CRITERIA  SIRS Temperature  1  SIRS Pulse 1  SIRS Respirations  1  SIRS WBC 0  SIRS Score Sum  3

## 2021-11-29 NOTE — Progress Notes (Addendum)
Calorie Count Note  48 hour calorie count ordered.  Diet: Heart Healthy Supplements:  -Ensure Plus High Protein po TID, each supplement provides 350 kcal and 20 grams of protein.   5/22: Breakfast: 500 kcals, 15g protein Lunch: 220 kcals, 3g protein Dinner: 335 kcals, 21g protein Supplements: Pt reports no Ensure. Still had one in her cooler from yesterday. States she will drink it today.  Total intake: 1055 kcal (52% of minimum estimated needs)  39g protein (37% of minimum estimated needs)  **Today 5/23, pt consumed 1/2 Dannon Lite and Fit yogurt and 1/2 a banana for breakfast.  Nutrition Dx: Moderate Malnutrition related to chronic illness (alcohol abuse) as evidenced by moderate fat depletion, moderate muscle depletion, severe muscle depletion.  Goal: Pt to meet >/= 90% of their estimated nutrition needs   Intervention:  -Continue Ensure Plus High Protein -Consider diet liberalization to regular to maximize menu options -TPN being decreased to half rate per Pharmacy note   Clayton Bibles, MS, RD, LDN Inpatient Clinical Dietitian Contact information available via Amion

## 2021-11-29 NOTE — Progress Notes (Signed)
Pharmacy Antibiotic Note  Jessica [name] is a 61 y.o. female admitted on 1/74/9449 with  complicated intra-abdominal infection .  She continues to spike fevers on Zosyn.  Pharmacy has been consulted for Vancomycin dosing.  Plan: Vancomycin '1250mg'$  IV x1 then '750mg'$  IV q12h to target AUC 400-550 (estimated AUC 509). Check Vancomycin level at steady state Check daily Scr while on Vanc + Zosyn  Monitor renal function and cx data    Height: '5\' 10"'$  (177.8 cm) Weight: 65 kg (143 lb 4.8 oz) IBW/kg (Calculated) : 68.5  Temp (24hrs), Avg:99.1 F (37.3 C), Min:98 F (36.7 C), Max:102.1 F (38.9 C)  Recent Labs  Lab 11/24/21 0323 11/25/21 0040 11/26/21 0341 11/27/21 0504 11/28/21 0504 11/29/21 0339  WBC 6.8 7.0 5.9 6.9 6.2  --   CREATININE 0.86 0.78 0.95 0.81 0.87 0.87    Estimated Creatinine Clearance: 70.6 mL/min (by C-G formula based on SCr of 0.87 mg/dL).    No Known Allergies  Antimicrobials this admission: 4/15 Vanc>>4/16; 5/23>> 4/15 Flagyl >>4/17 4/15 cefepime >>4/17 4/17 CTX >> 4/17 4/17 ZEI>> 4/27,  4/28 >> 5/5, resumed 5/8 >>    Dose adjustments this admission: 4/16 at 1129 VR = 11 (~24 hrs post dose) --> vanc 1000 mg IV x1   Microbiology results: 4/15 BCx: Klebsiella oxytoca R amp. Sens to all others F 4/16 MRSA PCR: negative 4/16 HIV NR 4/22 fluid from IR abdomen peritoneal drain: PsA (pans sens) FINAL 5/8 bcx x2: neg FINAL 5/17 bcx x2 (pt was tachy and has PICC): NGF 5/23 BCx: 5/23 UCx:  Thank you for allowing pharmacy to be a part of this patient's care.  Netta Cedars PharmD 11/29/2021 4:41 PM

## 2021-11-29 NOTE — Progress Notes (Signed)
OT Cancellation Note  Patient Details Name: Jessica [name] MRN: [id number] DOB: 1961/06/05   Cancelled Treatment:    Reason Eval/Treat Not Completed: Patient declined, when attempted at 8:39 as pt stated she did want to do anything including light EOB ADLs until she has her CT scan in an hour. OT tried pt again at 1309 but pt sleeping. Pt has given strict instructions to this OT to never wake her up for therapy. Will continue efforts as able.   Julien Girt 11/29/2021, 1:51 PM

## 2021-11-29 NOTE — Progress Notes (Signed)
PHARMACY - TOTAL PARENTERAL NUTRITION CONSULT NOTE   Indication: Prolonged ileus  Patient Measurements: Height: '5\' 10"'  (177.8 cm) Weight: 65 kg (143 lb 4.8 oz) IBW/kg (Calculated) : 68.5 TPN AdjBW (KG): 53.8 Body mass index is 20.56 kg/m. Usual Weight: 53.8 kg  Assessment: 61 yo F w/ PMH etoh use disorder, smoker, HTN, GERD. S/p Ex Lap Hartmans 10/22/2021 for perforated sigmoid colon.  Glucose / Insulin: no hx DM; CBGs continue to be well controlled after stopping SSI Electrolytes:  - Na low at 131, stable     - Co2 slight trend down to 19 - others stable WNL Vitamins:   - Vitamin C <0.1 (4/22); receiving 200 mg ascorbic acid daily in TPN from MVI - Vitamin A 14.2 (4/22); receiving 1 mg of vitamin A daily in TPN from [facility] - Unable to add additional ascorbic acid and vitamin A to TPN per TPN policy - 5/5 - initiated ascorbic acid 500 mg per tube BID and vitamin A 10,000 units per tube daily. Tube to be clamped x1 hour after administration then returned to suction.  Renal: SCr stable WNL; BUN elevated ; UOP 3650 mL In last 24 hr,    Hepatic: LFTs, Tbil are WNL,  Alk Phos borderline elevated; TG stable WNL.  - Albumin remains low - prealbumin 18.2 on 5/13  Intake / Output:  - no MIVF GI Imaging:  - 4/15 CTA: bowel perforation - 4/17 KUB ileus or pSBO - 4/20 CTA: severe ileus or pSBO - 5/04 CT: new pelvic collection not amenable to drainage, likely an abscess - 5/11 CTa/p: fluid collections unchanged from 5/4 - 5/17 abd CT: Central pelvic enhancing fluid collection has decreased in size. Diffusely dilated small bowel containing contrast worrisome for small bowel ileus or partial distal small bowel obstruction. Increasing bilateral pleural effusions. GI Surgeries / Procedures:  - 10/22/21 s/p EXPLORATORY LAPAROTOMY hartmans for perforated sigmoid colon - 4/23 IR placed drain LLQ - 5/10 Drainage cath exchanged  Central access: CVC TL placed 10/23/21 TPN start date:  10/26/21  Nutritional Goals: - goal for ileus: Mag > 2, K > 4, phos ~3 RD Assessment: (5/10) Estimated Needs Total Energy Estimated Needs: 2000-2200 kcal Total Protein Estimated Needs: 105-125 grams Total Fluid Estimated Needs: >/= 2.2 L/day  Current Nutrition:  Soft diet, minimal intake (< 50%) Refusing all supplements Goal concentrated TPN rate is 70 mL/hr (provides 124 g of protein and 2040 kcals per day - 100% of goal)   On 5/22 Dr. Johney Maine updated consult to state  "If patient having greater than 50% calories p.o. and through gastrostomy tube, cut TPN in half.  If meeting 100% goals between p.o. and enteral nutrition, can wean off TPN over 48 hours" - 5/23 Message Ferdinand Cava PA to clarify if rate should be cut in half today since pt ate so well in past 24 hours. PA stated ok to cut rate in half today.    Plan:  Given fluid overload requiring diuresis, concentrating TPN as able.      At 18:00:   Decrease concentrated TPN with clinisol at 40 mL/hr  Electrolytes in TPN: no changes from yesterday Na 154 mEq/L K 55 mEq/L Ca 4 mEq/L Mg 10 mEq/L (cannot incr further d/t stability/compatibility concerns) Phos 2 mmol/L Cl:Ac 2:1 Continue standard MVI and trace elements to TPN Continue folic acid & thiamine in TPN BMP, magnesium, phosphorus with AM labs  Monitor TPN labs on Mon/Thurs Follow up oral intake for appropriate wean off TPN  Jessica [name], PharmD, BCPS 11/29/2021 11:19 AM

## 2021-11-29 NOTE — Progress Notes (Signed)
38 Days Post-Op   Subjective/Chief Complaint: Ate great yesterday.  Output from Eakin's pouch with decreased output from 175 to 95cc yesterday.  Drinking contrast for her CT scan.   Objective: Vital signs in last 24 hours: Temp:  [98 F (36.7 C)-99.4 F (37.4 C)] 98.6 F (37 C) (05/23 0340) Pulse Rate:  [89-99] 89 (05/22 2156) Resp:  [18-20] 18 (05/23 0340) BP: (126-140)/(65-80) 126/80 (05/23 0340) SpO2:  [98 %] 98 % (05/23 0340) Last BM Date : 11/28/21  Intake/Output from previous day: 05/22 0701 - 05/23 0700 In: 2429.3 [P.O.:1440; I.V.:589.6; IV Piggyback:269.7] Out: 4030 [Urine:3650; Drains:95; Stool:285] Intake/Output this shift: No intake/output data recorded.   General appearance: alert and cooperative GI: soft, appropriately tender. Ostomy and eakins pouch intact draining feculent effluent, IR drain w/ feculent drainage.   Lab Results:  Recent Labs    11/27/21 0504 11/28/21 0504  WBC 6.9 6.2  HGB 7.9* 8.0*  HCT 23.3* 24.2*  PLT 186 195   BMET Recent Labs    11/28/21 0504 11/29/21 0339  NA 131* 131*  K 4.3 4.6  CL 103 107  CO2 21* 19*  GLUCOSE 111* 113*  BUN 36* 35*  CREATININE 0.87 0.87  CALCIUM 8.2* 7.9*   PT/INR No results for input(s): LABPROT, INR in the last 72 hours. ABG No results for input(s): PHART, HCO3 in the last 72 hours.  Invalid input(s): PCO2, PO2  Studies/Results: No results found.  Anti-infectives: Anti-infectives (From admission, onward)    Start     Dose/Rate Route Frequency Ordered Stop   11/14/21 0800  piperacillin-tazobactam (ZOSYN) IVPB 3.375 g        3.375 g 12.5 mL/hr over 240 Minutes Intravenous Every 8 hours 11/14/21 0739     11/04/21 1000  piperacillin-tazobactam (ZOSYN) IVPB 3.375 g        3.375 g 12.5 mL/hr over 240 Minutes Intravenous Every 8 hours 11/04/21 0935 11/11/21 1312   10/27/21 1600  cefTRIAXone (ROCEPHIN) 2 g in sodium chloride 0.9 % 100 mL IVPB  Status:  Discontinued        2 g 200 mL/hr over  30 Minutes Intravenous Every 24 hours 10/27/21 0843 10/27/21 1106   10/27/21 1600  piperacillin-tazobactam (ZOSYN) IVPB 3.375 g        3.375 g 12.5 mL/hr over 240 Minutes Intravenous Every 8 hours 10/27/21 1106 11/03/21 2000   10/27/21 1400  metroNIDAZOLE (FLAGYL) IVPB 500 mg  Status:  Discontinued        500 mg 100 mL/hr over 60 Minutes Intravenous Every 12 hours 10/27/21 0843 10/27/21 1106   10/24/21 1600  piperacillin-tazobactam (ZOSYN) IVPB 3.375 g  Status:  Discontinued        3.375 g 12.5 mL/hr over 240 Minutes Intravenous Every 8 hours 10/24/21 1422 10/27/21 0843   10/24/21 1000  cefTRIAXone (ROCEPHIN) 2 g in sodium chloride 0.9 % 100 mL IVPB  Status:  Discontinued        2 g 200 mL/hr over 30 Minutes Intravenous Every 24 hours 10/24/21 0747 10/24/21 1357   10/23/21 1530  vancomycin (VANCOCIN) IVPB 1000 mg/200 mL premix        1,000 mg 200 mL/hr over 60 Minutes Intravenous  Once 10/23/21 1439 10/23/21 1547   10/23/21 0200  ceFEPIme (MAXIPIME) 2 g in sodium chloride 0.9 % 100 mL IVPB  Status:  Discontinued        2 g 200 mL/hr over 30 Minutes Intravenous Every 12 hours 10/22/21 2032 10/24/21 0747   10/22/21  2200  cefOXitin (MEFOXIN) 2 g in sodium chloride 0.9 % 100 mL IVPB  Status:  Discontinued        2 g 200 mL/hr over 30 Minutes Intravenous Every 8 hours 10/22/21 1706 10/22/21 2032   10/22/21 1956  vancomycin variable dose per unstable renal function (pharmacist dosing)  Status:  Discontinued         Does not apply See admin instructions 10/22/21 1957 10/23/21 1917   10/22/21 1245  vancomycin (VANCOCIN) IVPB 1000 mg/200 mL premix        1,000 mg 200 mL/hr over 60 Minutes Intravenous  Once 10/22/21 1241 10/22/21 1402   10/22/21 1245  ceFEPIme (MAXIPIME) 2 g in sodium chloride 0.9 % 100 mL IVPB        2 g 200 mL/hr over 30 Minutes Intravenous  Once 10/22/21 1241 10/22/21 1321   10/22/21 1230  metroNIDAZOLE (FLAGYL) IVPB 500 mg  Status:  Discontinued        500 mg 100 mL/hr over  60 Minutes Intravenous Every 12 hours 10/22/21 1222 10/24/21 1357       Assessment/Plan: s/p Procedure(s): EXPLORATORY LAPAROTOMY hartmans (N/A) POD#38 - PERFORATED SIGMOID COLON - status post EX LAP, HARTMAN'S PROCEDURE, Gastrostomy tube placement - 9/76/7341 Dr. 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 WOCN following for colostomy and pouching of fistula - CT 5/4 and 5/11 with pelvic fluid collection measuring 6 x 3.2 cm. Discussed with IR and not felt amenable/safe for drainage. -  G-tube clamped 5/7. Patient tolerating. Tolerating ice chips/sips. SLP following  - CT A/P repeated 5/17 due to low grade fever and tachycardia - shows interval decrease in intra abdominal fluid collections. Pleural effusion. Generalized body wall edema.   -currently eating well with a calorie count in process.  Trying to slow down fistula output with fiber, imodium, etc.  Currently decreasing some despite oral intake.  If this continues, will try to wean TNA -cont to follow outputs -CT scan pending for today   FEN - G-tube capped, TPN, diet SOFT VTE - SCDs, SQH ID - zosyn 4/17 - 4/27; 4/28-5/6, 5/8 >>     LOS: 38 days    Jessica Cea PA-C 11/29/2021

## 2021-11-29 NOTE — Progress Notes (Signed)
PROGRESS NOTE  Jessica [name]:[id number] DOB: 13-Mar-1961 DOA: 10/22/2021 PCP: Deland Pretty, MD   LOS: 38 days   Brief Narrative / Interim history: 61 year old female with history of alcohol abuse, several alcohol withdrawals requiring admissions, tobacco use presented on 10/22/2021 with diffuse abdominal pain with CT of abdomen and pelvis showed pneumoperitoneum and free fluid.  She was started on broad-spectrum antibiotics and emergently taken to the OR and underwent exploratory laparotomy where she was found to have a perforated sigmoid colon.  She is status post Hartmann procedure and insertion of G-tube.  She was found to have Klebsiella bacteremia.  Hospital course complicated by development of left lower quadrant fluid collection status post IR drainage on 10/29/2021, drain cultures grew Pseudomonas.  This required having her drain adjusted and upsized on 510, fluid appeared feculent.  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  She had a repeat CT on 11/10/2021 with pelvic fluid collection measuring 6 x 3.2 cm.  This was discussed with IR and it was not felt safe or amenable to drainage.  She was continued on IV antibiotics.  ID was consulted.  Repeat CT scan showed stable pelvic abscess also remaining unable to be drained.  She is currently on TPN.  Palliative care was also consulted for goals of care discussion.  Subjective / 24h Interval events: She is doing well this morning, no nausea or vomiting.  Tells me that she is eating well.  Assesement and Plan: Principal Problem:   Bowel perforation (HCC) Active Problems:   Acute respiratory failure with hypoxia (HCC)   Acute metabolic encephalopathy   Oropharyngeal dysphagia   Chronic pain   Tobacco use disorder   Protein-calorie malnutrition, severe (HCC)   AKI (acute kidney injury) (HCC)   Hypernatremia, hyponatremia, hypokalemia, hyperphosphatemia   Elevated brain natriuretic  peptide (BNP) level   Right proximal humeral fracture   Normocytic anemia   Substance induced mood disorder (HCC)   Anxiety   Primary insomnia   Underweight   Colostomy in place [facility])   Stricture and stenosis of esophagus   Gastrostomy tube in place [facility])   Hyponatremia   Palliative care by specialist   Goals of care, counseling/discussion   General weakness   Septic shock ([place])   Postoperative intra-abdominal abscess   Colocutaneous fistula   Generalized abdominal pain   Aspiration pneumonia ([place])   Bacteremia due to Klebsiella pneumoniae   Acute encephalopathy   Humerus fracture   Pelvic abscess - pseudomonas - s/p percutaneous drainage 10/29/2021  Principal problem Complicated intra-abdominal infection, Pseudomonas infection-Perforated sigmoid colon status post expiratory laparotomy/Hartman's procedure/G-tube placement.  General surgery as well as infectious disease consulted and following.  Currently she is on Zosyn.  Repeat CT scan of the abdomen and pelvis pending.  Advancing diet with calorie count, hopefully can wean off TPN.  One of her abscess was drained on 4/22, growing pansensitive Pseudomonas.  Active problems Septic shock due to Klebsiella bacteremia -present on admission, initially requiring ICU, now shock physiology has resolved.  As above, currently is on Zosyn. Repeat blood cultures from 11/23/2021 have remained negative.  She is afebrile now  Acute respiratory failure with hypoxia/aspiration pneumonia-Has required intubation and extubation twice.  Extubated again on 11/08/2021.  Currently on room air.   Acute metabolic encephalopathy -Multifactorial due to ICU delirium/infectious etiology/alcohol withdrawal.  Resolved, appears back to baseline   AKI -Resolved.   Essential hypertension -Monitor blood pressure.  Continue metoprolol  and hydralazine.   Anemia of chronic disease -Has required 4 units packed red cells transfusion during the hospitalization with last  transfusion being on 11/24/2021 for hemoglobin of 6.9.  Hemoglobin has remained stable, no bleeding   Hyponatremia -sodium has remained stable at around 131 for the past several days.  Received few doses of Lasix, now on hold.  She appears euvolemic.  She is on salt tabs, continue.  She is on TPN   Severe protein calorie malnutrition/hypoalbuminemia, anorexia -Due to poor oral intake.  Continue TPN.  Nutrition following.   Hypomagnesemia -Resolved   Substance-induced mood disorder/anxiety -Continue lorazepam as needed.   Physical deconditioning -Will need SNF placement   Scheduled Meds:  acetaminophen (TYLENOL) oral liquid 160 mg/5 mL  1,000 mg Per Tube Q6H   vitamin C  500 mg Per Tube BID   chlorhexidine  15 mL Mouth Rinse BID   Chlorhexidine Gluconate Cloth  6 each Topical Daily   enoxaparin (LOVENOX) injection  40 mg Subcutaneous Q24H   feeding supplement  237 mL Per Tube Q8H   ferrous sulfate  325 mg Oral BID WC   fiber  1 packet Per Tube BID   gabapentin  300 mg Oral QHS   lip balm  1 application. Topical BID   loperamide  2 mg Oral BID   mouth rinse  15 mL Mouth Rinse q12n4p   megestrol  400 mg Oral BID   metoprolol tartrate  50 mg Per Tube BID   nicotine  14 mg Transdermal Daily   pantoprazole sodium  40 mg Per Tube Daily   sodium chloride (PF)       sodium chloride flush  10-40 mL Intracatheter Q12H   sodium chloride  1 g Per Tube BID WC   vitamin A  10,000 Units Per Tube Daily   Continuous Infusions:  sodium chloride 250 mL (11/22/21 0019)   methocarbamol (ROBAXIN) IV     piperacillin-tazobactam (ZOSYN)  IV 3.375 g (11/29/21 0113)   TPN ADULT (ION) 70 mL/hr at 11/28/21 1734   PRN Meds:.alum & mag hydroxide-simeth, antiseptic oral rinse, diphenhydrAMINE, diphenhydrAMINE, enalaprilat, fentaNYL (SUBLIMAZE) injection, hydrALAZINE, ipratropium-albuterol, lidocaine, LORazepam, magic mouthwash, menthol-cetylpyridinium, methocarbamol (ROBAXIN) IV, methocarbamol, metoprolol  tartrate, ondansetron (ZOFRAN) IV, oxyCODONE, phenol, prochlorperazine, simethicone, sodium chloride  Diet Orders (From admission, onward)     Start     Ordered   11/27/21 1013  Diet Heart Room service appropriate? Yes; Fluid consistency: Thin  Diet effective now       Comments: If patient experiences nausea, vomiting, worsening pain, or worsening distention; then, make NPO with sips/ice chips only until seen by MD  Question Answer Comment  Room service appropriate? Yes   Fluid consistency: Thin      11/27/21 1013            DVT prophylaxis: enoxaparin (LOVENOX) injection 40 mg Start: 11/27/21 1400 Place and maintain sequential compression device Start: 10/25/21 0954 SCDs Start: 10/22/21 1429   Lab Results  Component Value Date   PLT 195 11/28/2021      Code Status: Full Code  Family Communication: no family at bedside   Status is: Inpatient  Remains inpatient appropriate because: severity of illness  Level of care: Med-Surg  Consultants:  General surgery ID PCCM  Procedures:  none  Objective: Vitals:   11/28/21 2156 11/29/21 0100 11/29/21 0300 11/29/21 0340  BP: 140/65   126/80  Pulse: 89     Resp:    18  Temp:  98  F (36.7 C) 98.6 F (37 C) 98.6 F (37 C)  TempSrc:  Oral Oral Oral  SpO2:    98%  Weight:      Height:        Intake/Output Summary (Last 24 hours) at 11/29/2021 0953 Last data filed at 11/29/2021 0420 Gross per 24 hour  Intake 2429.29 ml  Output 4030 ml  Net -1600.71 ml   Wt Readings from Last 3 Encounters:  11/28/21 65 kg  12/11/17 58.6 kg    Examination:  Constitutional: NAD Eyes: no scleral icterus ENMT: Mucous membranes are moist.  Neck: normal, supple Respiratory: clear to auscultation bilaterally, no wheezing, no crackles. Normal respiratory effort. No accessory muscle use.  Cardiovascular: Regular rate and rhythm, no murmurs / rubs / gallops. No LE edema. Good peripheral pulses Abdomen: Bowel sounds  positive Musculoskeletal: no clubbing / cyanosis.  Skin: no rashes Neurologic: Nonfocal   Data Reviewed: I have independently reviewed following labs and imaging studies   CBC Recent Labs  Lab 11/24/21 0323 11/25/21 0040 11/25/21 0046 11/26/21 0341 11/27/21 0504 11/28/21 0504  WBC 6.8 7.0  --  5.9 6.9 6.2  HGB 6.9* 8.0* 8.1* 7.5* 7.9* 8.0*  HCT 21.3* 24.5* 24.1* 22.6* 23.3* 24.2*  PLT 183 181  --  180 186 195  MCV 90.6 89.7  --  89.0 88.6 88.6  MCH 29.4 29.3  --  29.5 30.0 29.3  MCHC 32.4 32.7  --  33.2 33.9 33.1  RDW 17.0* 16.6*  --  16.8* 17.0* 16.9*  LYMPHSABS 2.0 1.8  --  1.8 1.8 2.0  MONOABS 0.6 0.6  --  0.6 0.8 0.7  EOSABS 0.4 0.6*  --  0.4 0.3 0.2  BASOSABS 0.0 0.0  --  0.0 0.0 0.0    Recent Labs  Lab 11/24/21 0323 11/25/21 0040 11/26/21 0341 11/27/21 0504 11/28/21 0504 11/29/21 0339  NA 130* 125* 129* 131* 131* 131*  K 4.5 4.5 4.0 4.1 4.3 4.6  CL 102 98 97* 100 103 107  CO2 '22 22 26 24 '$ 21* 19*  GLUCOSE 115* 118* 111* 115* 111* 113*  BUN 30* 31* 36* 38* 36* 35*  CREATININE 0.86 0.78 0.95 0.81 0.87 0.87  CALCIUM 7.5* 7.5* 7.7* 8.1* 8.2* 7.9*  AST '17 25 24 23 21  '$ --   ALT '21 27 29 30 31  '$ --   ALKPHOS 81 115 130* 130* 129*  --   BILITOT 0.6 0.5 0.6 0.7 0.4  --   ALBUMIN 1.7* 1.9* 1.8* 1.9* 1.9*  --   MG 1.8 1.7 2.2 1.9 1.7 1.9    ------------------------------------------------------------------------------------------------------------------ Recent Labs    11/28/21 0504  TRIG 85    No results found for: HGBA1C ------------------------------------------------------------------------------------------------------------------ No results for input(s): TSH, T4TOTAL, T3FREE, THYROIDAB in the last 72 hours.  Invalid input(s): FREET3  Cardiac Enzymes No results for input(s): CKMB, TROPONINI, MYOGLOBIN in the last 168 hours.  Invalid input(s):  CK ------------------------------------------------------------------------------------------------------------------    Component Value Date/Time   BNP 173.1 (H) 11/07/2021 1405    CBG: Recent Labs  Lab 11/27/21 1908 11/28/21 0625 11/28/21 1132 11/28/21 1725 11/29/21 0113  GLUCAP 119* 111* 112* 124* 128*    Recent Results (from the past 240 hour(s))  Culture, blood (Routine X 2) w Reflex to ID Panel     Status: None   Collection Time: 11/23/21 10:51 AM   Specimen: BLOOD  Result Value Ref Range Status   Specimen Description   Final    BLOOD RIGHT ANTECUBITAL Performed  at [facility], [place] [address], [place], [place] [address]    Special Requests   Final    IN PEDIATRIC BOTTLE Blood Culture adequate volume Performed at [place] [address], [place], [place] [address]    Culture   Final    NO GROWTH 5 DAYS Performed at [facility], [place] [address], [place], [place] [address]    Report Status 11/28/2021 FINAL  Final  Culture, blood (Routine X 2) w Reflex to ID Panel     Status: None   Collection Time: 11/23/21 11:48 AM   Specimen: BLOOD  Result Value Ref Range Status   Specimen Description   Final    BLOOD BLOOD RIGHT FOREARM Performed at [facility] [address], [place], [place] [address]    Special Requests   Final    BOTTLES DRAWN AEROBIC ONLY Blood Culture adequate volume Performed at [facility] [address], [place], [place] [address]    Culture   Final    NO GROWTH 5 DAYS Performed at [facility], [place] [address], [place], [place] [address]    Report Status 11/28/2021 FINAL  Final     Radiology Studies: No results found.   Marzetta Board, MD, PhD [facility]  Between 7 am - 7 pm I am available, please contact me via Amion (for emergencies) or Securechat (non urgent messages)  Between 7 pm - 7 am I am not available, please contact night  coverage MD/APP via Amion

## 2021-11-29 NOTE — Progress Notes (Signed)
[facility] for Infectious Disease  Date of Admission:  10/22/2021           Reason for visit: Follow up on intra abdominal infection  Current antibiotics: Zosyn 4/17-5/5; 5/8 - present   Prior antibiotics: Ceftriaxone 4/17 Cefepime 4/15 - 4/17 Metronidazole 4/15 - 4/17 Vancomycin 4/15 - 4/16   Lines: 4/28-c lue picc   ASSESSMENT:    61 y.o. female admitted with:  Complicated intra-abdominal infection: Patient presented with septic shock due to Klebsiella bacteremia from a perforated sigmoid colon on 4/15.  Status post Hartman/colostomy procedure with G-tube placement at that time.  This was complicated by colocutaneous fistula formation and intra-abdominal abscess.  Status post IR drain placement into this fluid collection 4/22 with cultures growing Pseudomonas.  Another CT scan obtained 5/4 notable for a second pelvic fluid collection measuring 6 x 3.2 cm unable to be drained by IR.  On 5/10, she underwent drain upsize into the peri-stomal collection yielding feculent debris.  This residual abscess cavity was noted to have a persistent fistulous connection with her open midline abdominal wound.  She had repeat CT 5/17 showing resolution of fluid collection with catheter in place.  Her undrained pelvic collection was decreased in size now measuing 2.8x5.2x3.8cm.  She has other enhancing fluid collections in the presacral space and pelvic sidewall that were not signficantly changed in size.   Today 5/25 she spiked another fever per Dr Cruzita Lederer report, after I visited patient, sepsis workup is reinitiated. She does have a picc and is on iv abx and tpn and has intraabdominal process at risk for invasive candidiasis, also clabsi with gpc. Agree repeat w/u. Can add vanc. If continue to decompensate with hemodynamics consider adding micafungin. The abd ct today doesn't show new abscess  RECOMMENDATIONS:    Continue piptazo; anticipate another 4-6 weeks iv piptazo moving forward;  would like to avoid quinolone until the very end if necessary Sepsis workup sent per primary team Agree with adding vanc If worsening hemodynamics consider adding micafungin and remove picc Continue wound care Discussed with primary team   I spent more than 50 minute reviewing data/chart, and coordinating care and >50% direct face to face time providing counseling/discussing diagnostics/treatment plan with patient    Principal Problem:   Colocutaneous fistula Active Problems:   Substance induced mood disorder (HCC)   Bowel perforation (HCC)   Anxiety   Chronic pain   Primary insomnia   Tobacco use disorder   Underweight   Protein-calorie malnutrition, severe ([place])   Colostomy in place ([place])   AKI (acute kidney injury) ([place])   Stricture and stenosis of esophagus   Gastrostomy tube in place ([place])   Acute respiratory failure with hypoxia ([place])   Acute metabolic encephalopathy   Hypernatremia, hyponatremia, hypokalemia, hyperphosphatemia   Hyponatremia   Elevated brain natriuretic peptide (BNP) level   Right proximal humeral fracture   Oropharyngeal dysphagia   Normocytic anemia   Palliative care by specialist   Goals of care, counseling/discussion   General weakness   Septic shock ([place])   Postoperative intra-abdominal abscess   Generalized abdominal pain   Aspiration pneumonia ([place])   Bacteremia due to Klebsiella pneumoniae   Acute encephalopathy   Humerus fracture   Pelvic abscess - pseudomonas - s/p percutaneous drainage 10/29/2021    MEDICATIONS:    Scheduled Meds:  vitamin C  500 mg Oral BID   chlorhexidine  15 mL Mouth Rinse BID   Chlorhexidine Gluconate Cloth  6  each Topical Daily   enoxaparin (LOVENOX) injection  40 mg Subcutaneous Q24H   feeding supplement  237 mL Per Tube Q8H   ferrous sulfate  325 mg Oral BID WC   fiber  1 packet Oral BID   gabapentin  300 mg Oral QHS   lip balm  1 application. Topical BID   loperamide  2 mg Oral BID   mouth rinse   15 mL Mouth Rinse q12n4p   megestrol  400 mg Oral BID   metoprolol tartrate  50 mg Oral BID   nicotine  14 mg Transdermal Daily   [START ON 11/30/2021] pantoprazole sodium  40 mg Oral Daily   sodium chloride (PF)       sodium chloride flush  10-40 mL Intracatheter Q12H   sodium chloride  1 g Oral BID WC   [START ON 11/30/2021] vitamin A  10,000 Units Oral Daily   Continuous Infusions:  sodium chloride 250 mL (11/22/21 0019)   methocarbamol (ROBAXIN) IV     piperacillin-tazobactam (ZOSYN)  IV 3.375 g (11/29/21 1057)   TPN ADULT (ION) 70 mL/hr at 11/28/21 1734   TPN ADULT (ION)     vancomycin     PRN Meds:.acetaminophen, alum & mag hydroxide-simeth, antiseptic oral rinse, diphenhydrAMINE, diphenhydrAMINE, enalaprilat, fentaNYL (SUBLIMAZE) injection, hydrALAZINE, ipratropium-albuterol, lidocaine, LORazepam, magic mouthwash, menthol-cetylpyridinium, methocarbamol (ROBAXIN) IV, methocarbamol, metoprolol tartrate, ondansetron (ZOFRAN) IV, oxyCODONE, phenol, prochlorperazine, simethicone, sodium chloride  SUBJECTIVE:   24 hour events:  Said she felt flushing as we came and spoke with her, didn't want [place] to examine her Ct scan done today reviewed result no new abscess Not dyspneic. No cough. No headache  Had a fever after we left her  Review of Systems  All other systems reviewed and are negative.    OBJECTIVE:   Blood pressure (!) 184/101, pulse (!) 109, temperature 98.4 F (36.9 C), temperature source Oral, resp. rate (!) 27, height '5\' 10"'$  (1.778 m), weight 65 kg, SpO2 98 %. Body mass index is 20.56 kg/m.  General/constitutional: mild distress; conversant HEENT: normocephalic; per; conj clear CV: tachy Lungs: normal respiratory effort Abd: Soft, Nontender; didn't want [place] to look at the colostomy and abdominal devices otherwise Skin: No Rash Neuro: nonfocal  Central line presence: LUE picc no erythema     Lab Results: Lab Results  Component Value Date   WBC 6.2  11/28/2021   HGB 8.0 (L) 11/28/2021   HCT 24.2 (L) 11/28/2021   MCV 88.6 11/28/2021   PLT 195 11/28/2021    Lab Results  Component Value Date   NA 131 (L) 11/29/2021   K 4.6 11/29/2021   CO2 19 (L) 11/29/2021   GLUCOSE 113 (H) 11/29/2021   BUN 35 (H) 11/29/2021   CREATININE 0.87 11/29/2021   CALCIUM 7.9 (L) 11/29/2021   GFRNONAA >60 11/29/2021   GFRAA >60 12/12/2017    Lab Results  Component Value Date   ALT 31 11/28/2021   AST 21 11/28/2021   ALKPHOS 129 (H) 11/28/2021   BILITOT 0.4 11/28/2021    No results found for: CRP  No results found for: ESRSEDRATE   I have reviewed the micro and lab results in Epic.  Imaging: CT ABDOMEN PELVIS W CONTRAST  Result Date: 11/29/2021 CLINICAL DATA:  Abdominal pain. 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 Evaluate for undrained abscess. EXAM: CT ABDOMEN AND PELVIS WITH CONTRAST TECHNIQUE: Multidetector CT imaging of the abdomen and pelvis was performed using the standard protocol following bolus administration  of intravenous contrast. RADIATION DOSE REDUCTION: This exam was performed according to the departmental dose-optimization program which includes automated exposure control, adjustment of the mA and/or kV according to patient size and/or use of iterative reconstruction technique. CONTRAST:  39m OMNIPAQUE IOHEXOL 300 MG/ML  SOLN COMPARISON:  11/23/2021 FINDINGS: Lower chest: Unchanged appearance of moderate left pleural effusion. The small right pleural effusion has resolved. Hepatobiliary: Gallstones identified measuring up to 8 mm. No gallbladder wall thickening or bile duct dilatation. No focal liver abnormality. Pancreas: Unremarkable. No pancreatic ductal dilatation or surrounding inflammatory changes. Spleen: Normal in size without focal abnormality. Adrenals/Urinary Tract: The adrenal glands appear normal. No nephrolithiasis, hydronephrosis or kidney mass identified. Diffuse distension of  the urinary bladder is again noted. No focal bladder abnormality. Stomach/Bowel: There is a gastrostomy tube within the stomach. 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 Again noted are multiple mildly dilated loops of small bowel with multiple air-fluid levels. These measure up to 3.4 cm in diameter, similar to previous exam. Enteric contrast material is identified within small bowel loops as well as the cecum. Dilute enteric contrast material is noted within the colon up to the level of the colostomy. Vascular/Lymphatic: Aortic atherosclerosis. There is no aneurysm. No abdominopelvic adenopathy. Reproductive: No suspicious mass. Other: There is a percutaneous drainage catheter which enters the ventral pelvis from a left lower quadrant approach, image 61/2. This is unchanged in position from the previous exam the previously identified fluid collection in this area has resolved. -fluid collection in the posterior pelvis measures 3.9 x 2.2 by 2.8 cm (volume = 13 cm^3), image 67/2. This is compared with 4.9 x 2.8 by 3.7 cm (volume = 27 cm^3)previously. -peripherally enhancing fluid collection within the presacral soft tissue space measures 3.4 by 1.3 by 2.8 cm (volume = 6.5 cm^3), image 62/2. Previously this measured 3.5 x 1.3 by 2.7 cm (volume = 6.4 cm^3). No new fluid collections identified. Musculoskeletal: No acute or significant osseous findings. IMPRESSION: 1. Unchanged position of left-sided pigtail catheter. Previously identified fluid collection in this region is resolved. 2. Fluid collection within the posterior pelvis has decreased in volume from previous exam. The second, smaller presacral fluid collection is unchanged. 3. Unchanged appearance of moderate left pleural effusion with resolution of small right pleural effusion. 4. Similar appearance of diffusely increased caliber of the small bowel loops which may represent partial obstruction versus ileus. 5. Cholelithiasis. 6.  Aortic Atherosclerosis (ICD10-I70.0). Electronically Signed   By: TKerby MoorsM.D.   On: 11/29/2021 10:25     Imaging independently reviewed in Epic.    5/17 ct abd pelv with contrast 1. Left-sided pigtail catheter in place. Previously identified fluid collection in this region has resolved. 2. Central pelvic enhancing fluid collection has decreased in size. Other pelvic fluid collections are stable. 3. Diffusely dilated small bowel containing contrast worrisome for small bowel ileus or partial distal small bowel obstruction. 4. Increasing body wall edema. 5. Again seen is a left lower quadrant colostomy. 6. Increasing bilateral pleural effusions. 7. Cholelithiasis with stable gallbladder wall edema. Correlate clinically for cholecystitis. 8.  Aortic Atherosclerosis   5/23 ct abd pelv 1. Unchanged position of left-sided pigtail catheter. Previously identified fluid collection in this region is resolved. 2. Fluid collection within the posterior pelvis has decreased in volume from previous exam. The second, smaller presacral fluid collection is unchanged. 3. Unchanged appearance of moderate left pleural effusion with resolution of small right pleural effusion. 4. Similar appearance of diffusely increased  caliber of the small bowel loops which may represent partial obstruction versus ileus. 5. Cholelithiasis. 6. Aortic Atherosclerosis  Jabier Mutton, [place] [facility] [place] [phone]  pager   [phone] cell 11/29/2021, 4:07 PM

## 2021-11-30 DIAGNOSIS — L0291 Cutaneous abscess, unspecified: Secondary | ICD-10-CM

## 2021-11-30 LAB — URINE CULTURE: Culture: <10000 — AB

## 2021-11-30 LAB — GLUCOSE, CAPILLARY
Glucose-Capillary: 106 mg/dL — ABNORMAL HIGH (ref 70–99)
Glucose-Capillary: 107 mg/dL — ABNORMAL HIGH (ref 70–99)
Glucose-Capillary: 109 mg/dL — ABNORMAL HIGH (ref 70–99)
Glucose-Capillary: 114 mg/dL — ABNORMAL HIGH (ref 70–99)
Glucose-Capillary: 129 mg/dL — ABNORMAL HIGH (ref 70–99)

## 2021-11-30 LAB — COMPREHENSIVE METABOLIC PANEL
ALT: 28 U/L (ref 0–44)
AST: 19 U/L (ref 15–41)
Albumin: 2.1 g/dL — ABNORMAL LOW (ref 3.5–5.0)
Alkaline Phosphatase: 121 U/L (ref 38–126)
Anion gap: 8 (ref 5–15)
BUN: 31 mg/dL — ABNORMAL HIGH (ref 6–20)
CO2: 16 mmol/L — ABNORMAL LOW (ref 22–32)
Calcium: 8.2 mg/dL — ABNORMAL LOW (ref 8.9–10.3)
Chloride: 105 mmol/L (ref 98–111)
Creatinine, Ser: 0.73 mg/dL (ref 0.44–1.00)
GFR, Estimated: >60 mL/min (ref >60)
Glucose, Bld: 101 mg/dL — ABNORMAL HIGH (ref 70–99)
Potassium: 4.5 mmol/L (ref 3.5–5.1)
Sodium: 129 mmol/L — ABNORMAL LOW (ref 135–145)
Total Bilirubin: 0.4 mg/dL (ref 0.3–1.2)
Total Protein: 6.3 g/dL — ABNORMAL LOW (ref 6.5–8.1)

## 2021-11-30 LAB — PHOSPHORUS: Phosphorus: 4.5 mg/dL (ref 2.5–4.6)

## 2021-11-30 LAB — CBC
HCT: 25.7 % — ABNORMAL LOW (ref 36.0–46.0)
Hemoglobin: 8.3 g/dL — ABNORMAL LOW (ref 12.0–15.0)
MCH: 29.2 pg (ref 26.0–34.0)
MCHC: 32.3 g/dL (ref 30.0–36.0)
MCV: 90.5 fL (ref 80.0–100.0)
Platelets: 248 10*3/uL (ref 150–400)
RBC: 2.84 MIL/uL — ABNORMAL LOW (ref 3.87–5.11)
RDW: 17.2 % — ABNORMAL HIGH (ref 11.5–15.5)
WBC: 7.9 10*3/uL (ref 4.0–10.5)
nRBC: 0 % (ref 0.0–0.2)

## 2021-11-30 LAB — MAGNESIUM: Magnesium: 1.6 mg/dL — ABNORMAL LOW (ref 1.7–2.4)

## 2021-11-30 MED ORDER — MAGNESIUM SULFATE 4 GM/100ML IV SOLN
4.0000 g | Freq: Once | INTRAVENOUS | Status: AC
  Administered 2021-11-30: 4 g via INTRAVENOUS
  Filled 2021-11-30: qty 100

## 2021-11-30 MED ORDER — PANTOPRAZOLE SODIUM 40 MG PO TBEC
40.0000 mg | DELAYED_RELEASE_TABLET | Freq: Every day | ORAL | Status: DC
  Administered 2021-12-01 – 2021-12-06 (×6): 40 mg via ORAL
  Filled 2021-11-30 (×6): qty 1

## 2021-11-30 MED ORDER — TRACE MINERALS CU-MN-SE-ZN 300-55-60-3000 MCG/ML IV SOLN
INTRAVENOUS | Status: AC
  Filled 2021-11-30: qty 473.6

## 2021-11-30 MED ORDER — MEGESTROL ACETATE 400 MG/10ML PO SUSP
400.0000 mg | Freq: Two times a day (BID) | ORAL | Status: DC
  Administered 2021-11-30: 400 mg
  Filled 2021-11-30 (×4): qty 10

## 2021-11-30 MED ORDER — MAGNESIUM SULFATE 2 GM/50ML IV SOLN
2.0000 g | Freq: Once | INTRAVENOUS | Status: DC
Start: 2021-11-30 — End: 2021-11-30

## 2021-11-30 NOTE — Progress Notes (Signed)
[facility]  Date of Admission:  10/22/2021           Reason for visit: Follow up on intra abdominal infection  Current antibiotics: Vanc 5/23-c Zosyn 4/17-5/5; 5/8 - present   Prior antibiotics: Ceftriaxone 4/17 Cefepime 4/15 - 4/17 Metronidazole 4/15 - 4/17 Vancomycin 4/15 - 4/16   Lines: 4/28-c lue picc   ASSESSMENT:    61 y.o. female admitted with:  Complicated intra-abdominal infection: Patient presented with septic shock due to Klebsiella bacteremia from a perforated sigmoid colon on 4/15.  Status post Hartman/colostomy procedure with G-tube placement at that time.  This was complicated by colocutaneous fistula formation and intra-abdominal abscess.  Status post IR drain placement into this fluid collection 4/22 with cultures growing Pseudomonas.  Another CT scan obtained 5/4 notable for a second pelvic fluid collection measuring 6 x 3.2 cm unable to be drained by IR.  On 5/10, she underwent drain upsize into the peri-stomal collection yielding feculent debris.  This residual abscess cavity was noted to have a persistent fistulous connection with her open midline abdominal wound.  She had repeat CT 5/17 showing resolution of fluid collection with catheter in place.  Her undrained pelvic collection was decreased in size now measuing 2.8x5.2x3.8cm.  She has other enhancing fluid collections in the presacral space and pelvic sidewall that were not signficantly changed in size.  Repeat abd ct 5/23 showed much improvement. The pigtail catheter on the left abd quadrant removed 5/24 as that abscess had collapsed Today 5/25 she spiked another fever per Dr Cruzita Lederer report, after I visited patient, sepsis workup is reinitiated. She does have a picc and is on iv abx and tpn and has intraabdominal process at risk for invasive candidiasis, also clabsi with gpc. Agree repeat w/u. Can add vanc. If continue to decompensate with hemodynamics consider adding micafungin.  The abd ct today doesn't show new abscess  RECOMMENDATIONS:    Continue piptazo; anticipate another 4-6 weeks iv piptazo moving forward; would like to avoid quinolone until the very end if necessary If bcx remains negative tomorrow 5/25, dc vancomycin Follow up blood culture Continue wound care Discussed with primary team  I spent more than 35 minute reviewing data/chart, and coordinating care and >50% direct face to face time providing counseling/discussing diagnostics/treatment plan with patient     Principal Problem:   Colocutaneous fistula Active Problems:   Substance induced mood disorder (HCC)   Bowel perforation (HCC)   Anxiety   Chronic pain   Primary insomnia   Tobacco use disorder   Underweight   Protein-calorie malnutrition, severe ([place])   Colostomy in place ([place])   AKI (acute kidney injury) ([place])   Stricture and stenosis of esophagus   Gastrostomy tube in place ([employer])   Acute respiratory failure with hypoxia ([place])   Acute metabolic encephalopathy   Hypernatremia, hyponatremia, hypokalemia, hyperphosphatemia   Hyponatremia   Elevated brain natriuretic peptide (BNP) level   Right proximal humeral fracture   Oropharyngeal dysphagia   Normocytic anemia   Palliative care by specialist   Goals of care, counseling/discussion   General weakness   Sepsis ([place])   Postoperative intra-abdominal abscess   Generalized abdominal pain   Aspiration pneumonia ([place])   Bacteremia due to Klebsiella pneumoniae   Acute encephalopathy   Humerus fracture   Pelvic abscess - pseudomonas - s/p percutaneous drainage 10/29/2021   Intra-abdominal abscess ([place])    MEDICATIONS:    Scheduled Meds:  vitamin C  500 mg Oral BID   chlorhexidine  15 mL Mouth Rinse BID   Chlorhexidine Gluconate Cloth  6 each Topical Daily   enoxaparin (LOVENOX) injection  40 mg Subcutaneous Q24H   feeding supplement  237 mL Per Tube Q8H   ferrous sulfate  325 mg Oral BID WC   fiber  1 packet Oral BID    gabapentin  300 mg Oral QHS   lip balm  1 application. Topical BID   loperamide  2 mg Oral BID   mouth rinse  15 mL Mouth Rinse q12n4p   megestrol  400 mg Oral BID   metoprolol tartrate  50 mg Oral BID   nicotine  14 mg Transdermal Daily   [START ON 12/01/2021] pantoprazole  40 mg Oral Daily   sodium chloride flush  10-40 mL Intracatheter Q12H   sodium chloride  1 g Oral BID WC   vitamin A  10,000 Units Oral Daily   Continuous Infusions:  sodium chloride 250 mL (11/22/21 0019)   magnesium sulfate bolus IVPB     methocarbamol (ROBAXIN) IV     piperacillin-tazobactam (ZOSYN)  IV 3.375 g (11/30/21 0905)   TPN ADULT (ION) 40 mL/hr at 11/30/21 0800   TPN ADULT (ION)     vancomycin Stopped (11/30/21 0749)   PRN Meds:.acetaminophen, alum & mag hydroxide-simeth, antiseptic oral rinse, diphenhydrAMINE, diphenhydrAMINE, enalaprilat, fentaNYL (SUBLIMAZE) injection, hydrALAZINE, ipratropium-albuterol, LORazepam, magic mouthwash, menthol-cetylpyridinium, methocarbamol (ROBAXIN) IV, methocarbamol, metoprolol tartrate, ondansetron (ZOFRAN) IV, oxyCODONE, phenol, prochlorperazine, simethicone, sodium chloride  SUBJECTIVE:   24 hour events:  Fever resolved Bcx ngtd Ucx ngtd Chest xray no sign of pulm infiltrate Not hypoxic Felt better Wants to eat  Review of Systems  All other systems reviewed and are negative.    OBJECTIVE:   Blood pressure (!) 164/79, pulse 98, temperature 99.4 F (37.4 C), temperature source Oral, resp. rate 14, height '5\' 10"'$  (1.778 m), weight 65 kg, SpO2 99 %. Body mass index is 20.56 kg/m.  General/constitutional: no distress, pleasant HEENT: Normocephalic, PER, Conj Clear, EOMI, Oropharynx clear Neck supple CV: rrr no mrg Lungs: clear to auscultation, normal respiratory effort Abd: Soft, Nontender; colostomy functioning; fistula bag with stool. G-tube site no purulence Ext: no edema Skin: No Rash Neuro: nonfocal MSK: no peripheral joint  swelling/tenderness/warmth; back spines nontender    Central line presence: LUE picc no erythema     Lab Results: Lab Results  Component Value Date   WBC 7.9 11/30/2021   HGB 8.3 (L) 11/30/2021   HCT 25.7 (L) 11/30/2021   MCV 90.5 11/30/2021   PLT 248 11/30/2021    Lab Results  Component Value Date   NA 129 (L) 11/30/2021   K 4.5 11/30/2021   CO2 16 (L) 11/30/2021   GLUCOSE 101 (H) 11/30/2021   BUN 31 (H) 11/30/2021   CREATININE 0.73 11/30/2021   CALCIUM 8.2 (L) 11/30/2021   GFRNONAA >60 11/30/2021   GFRAA >60 12/12/2017    Lab Results  Component Value Date   ALT 28 11/30/2021   AST 19 11/30/2021   ALKPHOS 121 11/30/2021   BILITOT 0.4 11/30/2021    No results found for: CRP  No results found for: ESRSEDRATE   I have reviewed the micro and lab results in Epic.  Imaging: CT ABDOMEN PELVIS W CONTRAST  Result Date: 11/29/2021 CLINICAL DATA:  Abdominal pain. 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 Evaluate for undrained abscess. EXAM: CT ABDOMEN AND PELVIS WITH CONTRAST TECHNIQUE: Multidetector CT imaging of  the abdomen and pelvis was performed using the standard protocol following bolus administration of intravenous contrast. RADIATION DOSE REDUCTION: This exam was performed according to the departmental dose-optimization program which includes automated exposure control, adjustment of the mA and/or kV according to patient size and/or use of iterative reconstruction technique. CONTRAST:  67m OMNIPAQUE IOHEXOL 300 MG/ML  SOLN COMPARISON:  11/23/2021 FINDINGS: Lower chest: Unchanged appearance of moderate left pleural effusion. The small right pleural effusion has resolved. Hepatobiliary: Gallstones identified measuring up to 8 mm. No gallbladder wall thickening or bile duct dilatation. No focal liver abnormality. Pancreas: Unremarkable. No pancreatic ductal dilatation or surrounding inflammatory changes. Spleen: Normal in size  without focal abnormality. Adrenals/Urinary Tract: The adrenal glands appear normal. No nephrolithiasis, hydronephrosis or kidney mass identified. Diffuse distension of the urinary bladder is again noted. No focal bladder abnormality. Stomach/Bowel: There is a gastrostomy tube within the stomach. 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 Again noted are multiple mildly dilated loops of small bowel with multiple air-fluid levels. These measure up to 3.4 cm in diameter, similar to previous exam. Enteric contrast material is identified within small bowel loops as well as the cecum. Dilute enteric contrast material is noted within the colon up to the level of the colostomy. Vascular/Lymphatic: Aortic atherosclerosis. There is no aneurysm. No abdominopelvic adenopathy. Reproductive: No suspicious mass. Other: There is a percutaneous drainage catheter which enters the ventral pelvis from a left lower quadrant approach, image 61/2. This is unchanged in position from the previous exam the previously identified fluid collection in this area has resolved. -fluid collection in the posterior pelvis measures 3.9 x 2.2 by 2.8 cm (volume = 13 cm^3), image 67/2. This is compared with 4.9 x 2.8 by 3.7 cm (volume = 27 cm^3)previously. -peripherally enhancing fluid collection within the presacral soft tissue space measures 3.4 by 1.3 by 2.8 cm (volume = 6.5 cm^3), image 62/2. Previously this measured 3.5 x 1.3 by 2.7 cm (volume = 6.4 cm^3). No new fluid collections identified. Musculoskeletal: No acute or significant osseous findings. IMPRESSION: 1. Unchanged position of left-sided pigtail catheter. Previously identified fluid collection in this region is resolved. 2. Fluid collection within the posterior pelvis has decreased in volume from previous exam. The second, smaller presacral fluid collection is unchanged. 3. Unchanged appearance of moderate left pleural effusion with resolution of small right  pleural effusion. 4. Similar appearance of diffusely increased caliber of the small bowel loops which may represent partial obstruction versus ileus. 5. Cholelithiasis. 6. Aortic Atherosclerosis (ICD10-I70.0). Electronically Signed   By: TKerby MoorsM.D.   On: 11/29/2021 10:25   DG CHEST PORT 1 VIEW  Result Date: 11/29/2021 CLINICAL DATA:  [id number] pain EXAM: PORTABLE CHEST 1 VIEW COMPARISON:  Radiograph dated Nov 23, 2021 FINDINGS: The cardiomediastinal silhouette is unchanged in contour.LEFT upper extremity PICC tip with tip terminating over the superior RIGHT atrium, unchanged. No large pleural effusion. No pneumothorax. No acute pleuroparenchymal abnormality. Visualized abdomen is unremarkable. IMPRESSION: No acute cardiopulmonary abnormality. Electronically Signed   By: SValentino SaxonM.D.   On: 11/29/2021 16:35     Imaging independently reviewed in Epic.    5/17 ct abd pelv with contrast 1. Left-sided pigtail catheter in place. Previously identified fluid collection in this region has resolved. 2. Central pelvic enhancing fluid collection has decreased in size. Other pelvic fluid collections are stable. 3. Diffusely dilated small bowel containing contrast worrisome for small bowel ileus or partial distal small bowel obstruction. 4. Increasing body wall  edema. 5. Again seen is a left lower quadrant colostomy. 6. Increasing bilateral pleural effusions. 7. Cholelithiasis with stable gallbladder wall edema. Correlate clinically for cholecystitis. 8.  Aortic Atherosclerosis   5/23 ct abd pelv 1. Unchanged position of left-sided pigtail catheter. Previously identified fluid collection in this region is resolved. 2. Fluid collection within the posterior pelvis has decreased in volume from previous exam. The second, smaller presacral fluid collection is unchanged. 3. Unchanged appearance of moderate left pleural effusion with resolution of small right pleural effusion. 4. Similar  appearance of diffusely increased caliber of the small bowel loops which may represent partial obstruction versus ileus. 5. Cholelithiasis. 6. Aortic Atherosclerosis  Jabier Mutton, [facility] [place] [phone]  pager   [phone] cell 11/30/2021, 1:16 PM

## 2021-11-30 NOTE — Progress Notes (Signed)
PROGRESS NOTE  Jessica [name] KGM:[id number] DOB: [birth year] DOA: 10/22/2021 PCP: Deland Pretty, MD   LOS: 39 days   Brief Narrative / Interim history: 61 year old female with history of alcohol abuse, several alcohol withdrawals requiring admissions, tobacco use presented on 10/22/2021 with diffuse abdominal pain with CT of abdomen and pelvis showed pneumoperitoneum and free fluid.  She was started on broad-spectrum antibiotics and emergently taken to the OR and underwent exploratory laparotomy where she was found to have a perforated sigmoid colon.  She is status post Hartmann procedure and insertion of G-tube.  She was found to have Klebsiella bacteremia.  Hospital course complicated by development of left lower quadrant fluid collection status post IR drainage on 10/29/2021, drain cultures grew Pseudomonas.  This required having her drain adjusted and upsized on 510, fluid appeared feculent.  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  She had a repeat CT on 11/10/2021 with pelvic fluid collection measuring 6 x 3.2 cm.  This was discussed with IR and it was not felt safe or amenable to drainage.  She was continued on IV antibiotics.  ID was consulted.  Repeat CT scan showed stable pelvic abscess also remaining unable to be drained.  She is currently on TPN.  Palliative care was also consulted for goals of care discussion.  Subjective / 24h Interval events: No further chills overnight.  No significant nausea or eating.  Wants to eat.  Assesement and Plan: Principal Problem:   Colocutaneous fistula Active Problems:   Bowel perforation (HCC)   Acute respiratory failure with hypoxia (HCC)   Acute metabolic encephalopathy   Oropharyngeal dysphagia   Chronic pain   Tobacco use disorder   Protein-calorie malnutrition, severe (HCC)   AKI (acute kidney injury) (HCC)   Hypernatremia, hyponatremia, hypokalemia, hyperphosphatemia   Elevated brain  natriuretic peptide (BNP) level   Right proximal humeral fracture   Normocytic anemia   Substance induced mood disorder (HCC)   Anxiety   Primary insomnia   Underweight   Colostomy in place [facility])   Stricture and stenosis of esophagus   Gastrostomy tube in place [facility])   Hyponatremia   Palliative care by specialist   Goals of care, counseling/discussion   General weakness   Sepsis ([place])   Postoperative intra-abdominal abscess   Generalized abdominal pain   Aspiration pneumonia ([place])   Bacteremia due to Klebsiella pneumoniae   Acute encephalopathy   Humerus fracture   Pelvic abscess - pseudomonas - s/p percutaneous drainage 10/29/2021   Intra-abdominal abscess ([place])  Principal problem Complicated intra-abdominal infection, Pseudomonas infection, recurrent fever-Perforated sigmoid colon status post expiratory laparotomy/Hartman's procedure/G-tube placement.  General surgery as well as infectious disease consulted and following.  Currently she is on Zosyn.  Vancomycin had to be added 5/23 due to fever, tachycardia.  She was also moved to stepdown.  Repeat CT scan 5/23 with improving pelvic fluid collections.  Culture sent again on 5/23 are pending. Advancing diet with calorie count, hopefully can wean off TPN.  One of her abscess was drained on 4/22, growing pansensitive Pseudomonas.  Active problems Septic shock due to Klebsiella bacteremia -present on admission, initially requiring ICU, now shock physiology has resolved.  As above, currently is on vancomycin and Zosyn. Repeat blood cultures from 11/23/2021 have remained negative.  Given fever 5/23, repeat blood cultures pending  Acute respiratory failure with hypoxia/aspiration pneumonia-Has required intubation and extubation twice.  Extubated again on 11/08/2021.  Respiratory status is stable,  currently she is on room air   Acute metabolic encephalopathy -Multifactorial due to ICU delirium/infectious etiology/alcohol withdrawal.  Resolved,  appears back to baseline   AKI -Resolved.   Essential hypertension -Monitor blood pressure.  Continue metoprolol and hydralazine.   Anemia of chronic disease -Has required 4 units packed red cells transfusion during the hospitalization with last transfusion being on 11/24/2021 for hemoglobin of 6.9.  Hemoglobin has remained stable, no bleeding   Hyponatremia -mild, sodium 129 this morning.  Overall stable without large shifts. Received few doses of Lasix, now on hold.  She appears euvolemic.  She is on salt tabs, continue.  She is on TPN   Severe protein calorie malnutrition/hypoalbuminemia, anorexia -Due to poor oral intake.  Continue TPN.  Nutrition following.   Hypomagnesemia -Resolved   Substance-induced mood disorder/anxiety -Continue lorazepam as needed.   Physical deconditioning -Will need SNF placement   Scheduled Meds:  vitamin C  500 mg Oral BID   chlorhexidine  15 mL Mouth Rinse BID   Chlorhexidine Gluconate Cloth  6 each Topical Daily   enoxaparin (LOVENOX) injection  40 mg Subcutaneous Q24H   feeding supplement  237 mL Per Tube Q8H   ferrous sulfate  325 mg Oral BID WC   fiber  1 packet Oral BID   gabapentin  300 mg Oral QHS   lip balm  1 application. Topical BID   loperamide  2 mg Oral BID   mouth rinse  15 mL Mouth Rinse q12n4p   megestrol  400 mg Oral BID   metoprolol tartrate  50 mg Oral BID   nicotine  14 mg Transdermal Daily   [START ON 12/01/2021] pantoprazole  40 mg Oral Daily   sodium chloride flush  10-40 mL Intracatheter Q12H   sodium chloride  1 g Oral BID WC   vitamin A  10,000 Units Oral Daily   Continuous Infusions:  sodium chloride 250 mL (11/22/21 0019)   methocarbamol (ROBAXIN) IV     piperacillin-tazobactam (ZOSYN)  IV 3.375 g (11/30/21 0905)   TPN ADULT (ION) 40 mL/hr at 11/30/21 0800   vancomycin Stopped (11/30/21 0749)   PRN Meds:.acetaminophen, alum & mag hydroxide-simeth, antiseptic oral rinse, diphenhydrAMINE, diphenhydrAMINE,  enalaprilat, fentaNYL (SUBLIMAZE) injection, hydrALAZINE, ipratropium-albuterol, LORazepam, magic mouthwash, menthol-cetylpyridinium, methocarbamol (ROBAXIN) IV, methocarbamol, metoprolol tartrate, ondansetron (ZOFRAN) IV, oxyCODONE, phenol, prochlorperazine, simethicone, sodium chloride  Diet Orders (From admission, onward)     Start     Ordered   11/27/21 1013  Diet Heart Room service appropriate? Yes; Fluid consistency: Thin  Diet effective now       Comments: If patient experiences nausea, vomiting, worsening pain, or worsening distention; then, make NPO with sips/ice chips only until seen by MD  Question Answer Comment  Room service appropriate? Yes   Fluid consistency: Thin      11/27/21 1013            DVT prophylaxis: enoxaparin (LOVENOX) injection 40 mg Start: 11/27/21 1400 Place and maintain sequential compression device Start: 10/25/21 0954 SCDs Start: 10/22/21 1429   Lab Results  Component Value Date   PLT 248 11/30/2021      Code Status: Full Code  Family Communication: no family at bedside   Status is: Inpatient  Remains inpatient appropriate because: severity of illness  Level of care: Stepdown  Consultants:  General surgery ID [email or web address]  Procedures:  none  Objective: Vitals:   11/30/21 0500 11/30/21 0600 11/30/21 0700 11/30/21 0729  BP: 126/63 (!) 151/71 (!) 164/79  Pulse: 87 90 92 98  Resp: '15 14 17 14  '$ Temp:    97.9 F (36.6 C)  TempSrc:    Oral  SpO2: 98% 98% 99% 99%  Weight:      Height:        Intake/Output Summary (Last 24 hours) at 11/30/2021 0940 Last data filed at 11/30/2021 0910 Gross per 24 hour  Intake 2092.37 ml  Output 4150 ml  Net -2057.63 ml    Wt Readings from Last 3 Encounters:  11/28/21 65 kg  12/11/17 58.6 kg    Examination:  Constitutional: NAD Eyes: lids and conjunctivae normal, no scleral icterus ENMT: mmm Neck: normal, supple Respiratory: clear to auscultation bilaterally, no wheezing, no crackles.  Normal respiratory effort.  Cardiovascular: Regular rate and rhythm, no murmurs / rubs / gallops. No LE edema. Abdomen: soft. Bowel sounds positive.  Skin: no rashes Neurologic: no focal deficits, equal strength   Data Reviewed: I have independently reviewed following labs and imaging studies   CBC Recent Labs  Lab 11/24/21 0323 11/25/21 0040 11/25/21 0046 11/26/21 0341 11/27/21 0504 11/28/21 0504 11/30/21 0500  WBC 6.8 7.0  --  5.9 6.9 6.2 7.9  HGB 6.9* 8.0* 8.1* 7.5* 7.9* 8.0* 8.3*  HCT 21.3* 24.5* 24.1* 22.6* 23.3* 24.2* 25.7*  PLT 183 181  --  180 186 195 248  MCV 90.6 89.7  --  89.0 88.6 88.6 90.5  MCH 29.4 29.3  --  29.5 30.0 29.3 29.2  MCHC 32.4 32.7  --  33.2 33.9 33.1 32.3  RDW 17.0* 16.6*  --  16.8* 17.0* 16.9* 17.2*  LYMPHSABS 2.0 1.8  --  1.8 1.8 2.0  --   MONOABS 0.6 0.6  --  0.6 0.8 0.7  --   EOSABS 0.4 0.6*  --  0.4 0.3 0.2  --   BASOSABS 0.0 0.0  --  0.0 0.0 0.0  --      Recent Labs  Lab 11/25/21 0040 11/26/21 0341 11/27/21 0504 11/28/21 0504 11/29/21 0339 11/30/21 0500  NA 125* 129* 131* 131* 131* 129*  K 4.5 4.0 4.1 4.3 4.6 4.5  CL 98 97* 100 103 107 105  CO2 '22 26 24 '$ 21* 19* 16*  GLUCOSE 118* 111* 115* 111* 113* 101*  BUN 31* 36* 38* 36* 35* 31*  CREATININE 0.78 0.95 0.81 0.87 0.87 0.73  CALCIUM 7.5* 7.7* 8.1* 8.2* 7.9* 8.2*  AST '25 24 23 21  '$ --  19  ALT '27 29 30 31  '$ --  28  ALKPHOS 115 130* 130* 129*  --  121  BILITOT 0.5 0.6 0.7 0.4  --  0.4  ALBUMIN 1.9* 1.8* 1.9* 1.9*  --  2.1*  MG 1.7 2.2 1.9 1.7 1.9 1.6*     ------------------------------------------------------------------------------------------------------------------ Recent Labs    11/28/21 0504  TRIG 85     No results found for: HGBA1C ------------------------------------------------------------------------------------------------------------------ No results for input(s): TSH, T4TOTAL, T3FREE, THYROIDAB in the last 72 hours.  Invalid input(s): FREET3  Cardiac  Enzymes No results for input(s): CKMB, TROPONINI, MYOGLOBIN in the last 168 hours.  Invalid input(s): CK ------------------------------------------------------------------------------------------------------------------    Component Value Date/Time   BNP 173.1 (H) 11/07/2021 1405    CBG: Recent Labs  Lab 11/29/21 0113 11/29/21 1128 11/29/21 1708 11/29/21 2359 11/30/21 0525  GLUCAP 128* 122* 118* 109* 107*     Recent Results (from the past 240 hour(s))  Culture, blood (Routine X 2) w Reflex to ID Panel     Status: None  Collection Time: 11/23/21 10:51 AM   Specimen: BLOOD  Result Value Ref Range Status   Specimen Description   Final    BLOOD RIGHT ANTECUBITAL Performed at [facility] [address], [place], [place] [address]    Special Requests   Final    IN PEDIATRIC BOTTLE Blood Culture adequate volume Performed at [facility] [address] [place], [place] [address]    Culture   Final    NO GROWTH 5 DAYS Performed at [facility], [place] [address] [place], [place] [address]    Report Status 11/28/2021 FINAL  Final  Culture, blood (Routine X 2) w Reflex to ID Panel     Status: None   Collection Time: 11/23/21 11:48 AM   Specimen: BLOOD  Result Value Ref Range Status   Specimen Description   Final    BLOOD BLOOD RIGHT FOREARM Performed at [facility] [address] [place], [place] [address]    Special Requests   Final    BOTTLES DRAWN AEROBIC ONLY Blood Culture adequate volume Performed at [facility] [address] [place], [place] [address]    Culture   Final    NO GROWTH 5 DAYS Performed at [facility], [place] [address] [place], [place] [address]    Report Status 11/28/2021 FINAL  Final  Culture, blood (Routine X 2) w Reflex to ID Panel     Status: Abnormal (Preliminary result)   Collection Time: 11/29/21  4:14 PM   Specimen: BLOOD  Result Value Ref  Range Status   Specimen Description   Final    BLOOD RIGHT ANTECUBITAL Performed at [facility] [address] [place], [place] [address]    Special Requests (A)  Final    AEROCOCCUS SPECIES Blood Culture adequate volume Performed at [facility] [address] [place], [place] [address]    Culture   Final    NO GROWTH < 24 HOURS Performed at [facility] [address] [place], [place] [address]    Report Status PENDING  Incomplete  Culture, blood (Routine X 2) w Reflex to ID Panel     Status: None (Preliminary result)   Collection Time: 11/29/21  4:19 PM   Specimen: BLOOD  Result Value Ref Range Status   Specimen Description   Final    BLOOD RIGHT ANTECUBITAL LOWER Performed at [facility] [address] [place], [place] [address]    Special Requests   Final    BOTTLES DRAWN AEROBIC ONLY Blood Culture adequate volume Performed at [facility] [address] [place], [place] [address]    Culture   Final    NO GROWTH < 24 HOURS Performed at [facility] [address] [place], [place] [address]    Report Status PENDING  Incomplete      Radiology Studies: CT ABDOMEN PELVIS W CONTRAST  Result Date: 11/29/2021 CLINICAL DATA:  Abdominal pain. 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 Evaluate for undrained abscess. EXAM: CT ABDOMEN AND PELVIS WITH CONTRAST TECHNIQUE: Multidetector CT imaging of the abdomen and pelvis was performed using the standard protocol following bolus administration of intravenous contrast. RADIATION DOSE REDUCTION: This exam was performed according to the departmental dose-optimization program which includes automated exposure control, adjustment of the mA and/or kV according to patient size and/or use of iterative reconstruction technique. CONTRAST:  76m OMNIPAQUE  IOHEXOL 300 MG/ML  SOLN COMPARISON:  11/23/2021  FINDINGS: Lower chest: Unchanged appearance of moderate left pleural effusion. The small right pleural effusion has resolved. Hepatobiliary: Gallstones identified measuring up to 8 mm. No gallbladder wall thickening or bile duct dilatation. No focal liver abnormality. Pancreas: Unremarkable. No pancreatic ductal dilatation or surrounding inflammatory changes. Spleen: Normal in size without focal abnormality. Adrenals/Urinary Tract: The adrenal glands appear normal. No nephrolithiasis, hydronephrosis or kidney mass identified. Diffuse distension of the urinary bladder is again noted. No focal bladder abnormality. Stomach/Bowel: There is a gastrostomy tube within the stomach. 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 Again noted are multiple mildly dilated loops of small bowel with multiple air-fluid levels. These measure up to 3.4 cm in diameter, similar to previous exam. Enteric contrast material is identified within small bowel loops as well as the cecum. Dilute enteric contrast material is noted within the colon up to the level of the colostomy. Vascular/Lymphatic: Aortic atherosclerosis. There is no aneurysm. No abdominopelvic adenopathy. Reproductive: No suspicious mass. Other: There is a percutaneous drainage catheter which enters the ventral pelvis from a left lower quadrant approach, image 61/2. This is unchanged in position from the previous exam the previously identified fluid collection in this area has resolved. -fluid collection in the posterior pelvis measures 3.9 x 2.2 by 2.8 cm (volume = 13 cm^3), image 67/2. This is compared with 4.9 x 2.8 by 3.7 cm (volume = 27 cm^3)previously. -peripherally enhancing fluid collection within the presacral soft tissue space measures 3.4 by 1.3 by 2.8 cm (volume = 6.5 cm^3), image 62/2. Previously this measured 3.5 x 1.3 by 2.7 cm (volume = 6.4 cm^3). No new fluid collections identified. Musculoskeletal: No acute or significant osseous  findings. IMPRESSION: 1. Unchanged position of left-sided pigtail catheter. Previously identified fluid collection in this region is resolved. 2. Fluid collection within the posterior pelvis has decreased in volume from previous exam. The second, smaller presacral fluid collection is unchanged. 3. Unchanged appearance of moderate left pleural effusion with resolution of small right pleural effusion. 4. Similar appearance of diffusely increased caliber of the small bowel loops which may represent partial obstruction versus ileus. 5. Cholelithiasis. 6. Aortic Atherosclerosis (ICD10-I70.0). Electronically Signed   By: Kerby Moors M.D.   On: 11/29/2021 10:25   DG CHEST PORT 1 VIEW  Result Date: 11/29/2021 CLINICAL DATA:  [id number]; pain EXAM: PORTABLE CHEST 1 VIEW COMPARISON:  Radiograph dated Nov 23, 2021 FINDINGS: The cardiomediastinal silhouette is unchanged in contour.LEFT upper extremity PICC tip with tip terminating over the superior RIGHT atrium, unchanged. No large pleural effusion. No pneumothorax. No acute pleuroparenchymal abnormality. Visualized abdomen is unremarkable. IMPRESSION: No acute cardiopulmonary abnormality. Electronically Signed   By: Valentino Saxon M.D.   On: 11/29/2021 16:35     Marzetta Board, MD, PhD [facility]  Between 7 am - 7 pm I am available, please contact me via Amion (for emergencies) or Securechat (non urgent messages)  Between 7 pm - 7 am I am not available, please contact night coverage MD/APP via Amion

## 2021-11-30 NOTE — Progress Notes (Signed)
Occupational Therapy Treatment Patient Details Name: Jessica [name] MRN: [id number] DOB: 1961-02-08 Today's Date: 11/30/2021   History of present illness Jessica [name] is an 60 y.o. female past medical history significant for alcohol abuse, with admissions for severe alcohol withdrawals requiring ICU admissions tobacco use presented to the ED on 10/22/2021 diffuse abdominal pain that started the day prior to admission.  CT of the abdomen pelvis showed pneumoperitoneum and free fluid, severe sepsis with started on broad-spectrum antibiotics fluid resuscitated surgery was consulted was taken emergently to the OR underwent exploratory laparotomy and was found to have a perforated sigmoid colon, status post Hartmann procedure and insertion of the G-tube which was left to drain to gravity, JP in pelvis was noted to have feculent peritonitis in the pelvis.  ICU course was complicated by Klebsiella bacteremia profound caloric protein malnutrition requiring TPN, acute respiratory failure with hypoxia and tachycardia with failure to progress. patient was transitioned back to ICU on 5/24 with fever.   OT comments  Patient was able to engage in sitting EOB with min guard with cues for proper sequencing of task. Patient was noted to still be quick to fatigue during session but motivated with ability to complete task with LUE. Patients blood pressure was 158/73 mmhg O2 was 98-99% on RA during session. Patient's discharge plan remains appropriate at this time. OT will continue to follow acutely.     Recommendations for follow up therapy are one component of a multi-disciplinary discharge planning process, led by the attending physician.  Recommendations may be updated based on patient status, additional functional criteria and insurance authorization.    Follow Up Recommendations  Skilled nursing-short term rehab (<3 hours/day)    Assistance Recommended at Discharge Frequent or constant Supervision/Assistance   Patient can return home with the following  Two people to help with walking and/or transfers;A lot of help with bathing/dressing/bathroom;Assistance with cooking/housework;Direct supervision/assist for medications management;Direct supervision/assist for financial management;Assist for transportation;Help with stairs or ramp for entrance   Equipment Recommendations  BSC/3in1    Recommendations for Other Services      Precautions / Restrictions Precautions Precautions: Fall Precaution Comments: multiple lines--> L JP drain, gastrostomy tube, triple lumen central line, colostomy Restrictions Weight Bearing Restrictions: No RUE Weight Bearing: Non weight bearing Other Position/Activity Restrictions: recent R UE/shoulder fx - assume NWB, though per chart review RUE WB status expired on 10/27/21.       Mobility Bed Mobility Overal bed mobility: Needs Assistance Bed Mobility: Supine to Sit, Sit to Supine Rolling: Supervision Sidelying to sit: HOB elevated, Min guard Supine to sit: Min assist     General bed mobility comments: patient was educated on rolling to prevent twisting and strain on abdomen and back with transition to edge of bed with increased time    Transfers                         Balance                                           ADL either performed or assessed with clinical judgement   ADL Overall ADL's : Needs assistance/impaired     Grooming: Min guard;Sitting;Oral care Grooming Details (indicate cue type and reason): On EOB with increased time and minimal use of RUE with following restrictions. noted to fatigue quicker during this session  Extremity/Trunk Assessment              Vision       Perception     Praxis      Cognition Arousal/Alertness: Awake/alert Behavior During Therapy: WFL for tasks assessed/performed Overall Cognitive Status: Within Functional Limits  for tasks assessed                                 General Comments: AxO x 3 increased motivation.        Exercises      Shoulder Instructions       General Comments      Pertinent Vitals/ Pain       Pain Assessment Pain Assessment: Faces Faces Pain Scale: Hurts a little bit Pain Location: R shoulder Pain Descriptors / Indicators: Discomfort, Grimacing, Guarding Pain Intervention(s): Monitored during session  Home Living                                          Prior Functioning/Environment              Frequency  Min 2X/week        Progress Toward Goals  OT Goals(current goals can now be found in the care plan section)  Progress towards OT goals: Progressing toward goals     Plan Discharge plan remains appropriate    Co-evaluation                 AM-PAC OT "6 Clicks" Daily Activity     Outcome Measure   Help from another person eating meals?: None Help from another person taking care of personal grooming?: A Little Help from another person toileting, which includes using toliet, bedpan, or urinal?: Total Help from another person bathing (including washing, rinsing, drying)?: A Lot Help from another person to put on and taking off regular upper body clothing?: A Lot Help from another person to put on and taking off regular lower body clothing?: A Lot 6 Click Score: 14    End of Session    OT Visit Diagnosis: Other abnormalities of gait and mobility (R26.89);Muscle weakness (generalized) (M62.81);Other symptoms and signs involving cognitive function   Activity Tolerance Patient limited by pain   Patient Left with call bell/phone within reach;in bed   Nurse Communication Mobility status        Time: 3382-5053 OT Time Calculation (min): 29 min  Charges: OT General Charges $OT Visit: 1 Visit OT Treatments $Self Care/Home Management : 23-37 mins  [name] OTR/L, MS Acute Rehabilitation  Department Office# [phone] Pager# [phone]   Marcellina Millin 11/30/2021, 2:58 PM

## 2021-11-30 NOTE — Progress Notes (Signed)
39 Days Post-Op   Subjective/Chief Complaint: Didn't eat great yesterday due to fevers and not feeling as well, but feeling a bit better today and wanting to eat again.   Objective: Vital signs in last 24 hours: Temp:  [97.8 F (36.6 C)-102.1 F (38.9 C)] 97.9 F (36.6 C) (05/24 0729) Pulse Rate:  [81-118] 98 (05/24 0729) Resp:  [13-36] 14 (05/24 0729) BP: (124-184)/(49-101) 164/79 (05/24 0700) SpO2:  [96 %-100 %] 99 % (05/24 0729) Last BM Date : 11/28/21  Intake/Output from previous day: 05/23 0701 - 05/24 0700 In: 1880.2 [P.O.:240; I.V.:1440.9; IV Piggyback:199.3] Out: 3200 [Urine:2900; Stool:300] Intake/Output this shift: Total I/O In: -  Out: 950 [Urine:650; Stool:300]   General appearance: alert and cooperative GI: soft, appropriately tender. Ostomy and eakins pouch intact draining feculent effluent, IR drain w/ feculent drainage.   Lab Results:  Recent Labs    11/28/21 0504 11/30/21 0500  WBC 6.2 7.9  HGB 8.0* 8.3*  HCT 24.2* 25.7*  PLT 195 248   BMET Recent Labs    11/29/21 0339 11/30/21 0500  NA 131* 129*  K 4.6 4.5  CL 107 105  CO2 19* 16*  GLUCOSE 113* 101*  BUN 35* 31*  CREATININE 0.87 0.73  CALCIUM 7.9* 8.2*   PT/INR No results for input(s): LABPROT, INR in the last 72 hours. ABG No results for input(s): PHART, HCO3 in the last 72 hours.  Invalid input(s): PCO2, PO2  Studies/Results: CT ABDOMEN PELVIS W CONTRAST  Result Date: 11/29/2021 CLINICAL DATA:  Abdominal pain. 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 Evaluate for undrained abscess. EXAM: CT ABDOMEN AND PELVIS WITH CONTRAST TECHNIQUE: Multidetector CT imaging of the abdomen and pelvis was performed using the standard protocol following bolus administration of intravenous contrast. RADIATION DOSE REDUCTION: This exam was performed according to the departmental dose-optimization program which includes automated exposure control, adjustment of  the mA and/or kV according to patient size and/or use of iterative reconstruction technique. CONTRAST:  19m OMNIPAQUE IOHEXOL 300 MG/ML  SOLN COMPARISON:  11/23/2021 FINDINGS: Lower chest: Unchanged appearance of moderate left pleural effusion. The small right pleural effusion has resolved. Hepatobiliary: Gallstones identified measuring up to 8 mm. No gallbladder wall thickening or bile duct dilatation. No focal liver abnormality. Pancreas: Unremarkable. No pancreatic ductal dilatation or surrounding inflammatory changes. Spleen: Normal in size without focal abnormality. Adrenals/Urinary Tract: The adrenal glands appear normal. No nephrolithiasis, hydronephrosis or kidney mass identified. Diffuse distension of the urinary bladder is again noted. No focal bladder abnormality. Stomach/Bowel: There is a gastrostomy tube within the stomach. 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 Again noted are multiple mildly dilated loops of small bowel with multiple air-fluid levels. These measure up to 3.4 cm in diameter, similar to previous exam. Enteric contrast material is identified within small bowel loops as well as the cecum. Dilute enteric contrast material is noted within the colon up to the level of the colostomy. Vascular/Lymphatic: Aortic atherosclerosis. There is no aneurysm. No abdominopelvic adenopathy. Reproductive: No suspicious mass. Other: There is a percutaneous drainage catheter which enters the ventral pelvis from a left lower quadrant approach, image 61/2. This is unchanged in position from the previous exam the previously identified fluid collection in this area has resolved. -fluid collection in the posterior pelvis measures 3.9 x 2.2 by 2.8 cm (volume = 13 cm^3), image 67/2. This is compared with 4.9 x 2.8 by 3.7 cm (volume = 27 cm^3)previously. -peripherally enhancing fluid collection within the  presacral soft tissue space measures 3.4 by 1.3 by 2.8 cm (volume = 6.5 cm^3),  image 62/2. Previously this measured 3.5 x 1.3 by 2.7 cm (volume = 6.4 cm^3). No new fluid collections identified. Musculoskeletal: No acute or significant osseous findings. IMPRESSION: 1. Unchanged position of left-sided pigtail catheter. Previously identified fluid collection in this region is resolved. 2. Fluid collection within the posterior pelvis has decreased in volume from previous exam. The second, smaller presacral fluid collection is unchanged. 3. Unchanged appearance of moderate left pleural effusion with resolution of small right pleural effusion. 4. Similar appearance of diffusely increased caliber of the small bowel loops which may represent partial obstruction versus ileus. 5. Cholelithiasis. 6. Aortic Atherosclerosis (ICD10-I70.0). Electronically Signed   By: Kerby Moors M.D.   On: 11/29/2021 10:25   DG CHEST PORT 1 VIEW  Result Date: 11/29/2021 CLINICAL DATA:  [id number]; pain EXAM: PORTABLE CHEST 1 VIEW COMPARISON:  Radiograph dated Nov 23, 2021 FINDINGS: The cardiomediastinal silhouette is unchanged in contour.LEFT upper extremity PICC tip with tip terminating over the superior RIGHT atrium, unchanged. No large pleural effusion. No pneumothorax. No acute pleuroparenchymal abnormality. Visualized abdomen is unremarkable. IMPRESSION: No acute cardiopulmonary abnormality. Electronically Signed   By: Valentino Saxon M.D.   On: 11/29/2021 16:35    Anti-infectives: Anti-infectives (From admission, onward)    Start     Dose/Rate Route Frequency Ordered Stop   11/30/21 0600  vancomycin (VANCOREADY) IVPB 750 mg/150 mL        750 mg 150 mL/hr over 60 Minutes Intravenous Every 12 hours 11/29/21 1640     11/29/21 1700  vancomycin (VANCOREADY) IVPB 1250 mg/250 mL        1,250 mg 166.7 mL/hr over 90 Minutes Intravenous  Once 11/29/21 1605 11/29/21 1841   11/14/21 0800  piperacillin-tazobactam (ZOSYN) IVPB 3.375 g        3.375 g 12.5 mL/hr over 240 Minutes Intravenous Every 8 hours 11/14/21  0739     11/04/21 1000  piperacillin-tazobactam (ZOSYN) IVPB 3.375 g        3.375 g 12.5 mL/hr over 240 Minutes Intravenous Every 8 hours 11/04/21 0935 11/11/21 1312   10/27/21 1600  cefTRIAXone (ROCEPHIN) 2 g in sodium chloride 0.9 % 100 mL IVPB  Status:  Discontinued        2 g 200 mL/hr over 30 Minutes Intravenous Every 24 hours 10/27/21 0843 10/27/21 1106   10/27/21 1600  piperacillin-tazobactam (ZOSYN) IVPB 3.375 g        3.375 g 12.5 mL/hr over 240 Minutes Intravenous Every 8 hours 10/27/21 1106 11/03/21 2000   10/27/21 1400  metroNIDAZOLE (FLAGYL) IVPB 500 mg  Status:  Discontinued        500 mg 100 mL/hr over 60 Minutes Intravenous Every 12 hours 10/27/21 0843 10/27/21 1106   10/24/21 1600  piperacillin-tazobactam (ZOSYN) IVPB 3.375 g  Status:  Discontinued        3.375 g 12.5 mL/hr over 240 Minutes Intravenous Every 8 hours 10/24/21 1422 10/27/21 0843   10/24/21 1000  cefTRIAXone (ROCEPHIN) 2 g in sodium chloride 0.9 % 100 mL IVPB  Status:  Discontinued        2 g 200 mL/hr over 30 Minutes Intravenous Every 24 hours 10/24/21 0747 10/24/21 1357   10/23/21 1530  vancomycin (VANCOCIN) IVPB 1000 mg/200 mL premix        1,000 mg 200 mL/hr over 60 Minutes Intravenous  Once 10/23/21 1439 10/23/21 1547   10/23/21 0200  ceFEPIme (MAXIPIME) 2  g in sodium chloride 0.9 % 100 mL IVPB  Status:  Discontinued        2 g 200 mL/hr over 30 Minutes Intravenous Every 12 hours 10/22/21 2032 10/24/21 0747   10/22/21 2200  cefOXitin (MEFOXIN) 2 g in sodium chloride 0.9 % 100 mL IVPB  Status:  Discontinued        2 g 200 mL/hr over 30 Minutes Intravenous Every 8 hours 10/22/21 1706 10/22/21 2032   10/22/21 1956  vancomycin variable dose per unstable renal function (pharmacist dosing)  Status:  Discontinued         Does not apply See admin instructions 10/22/21 1957 10/23/21 1917   10/22/21 1245  vancomycin (VANCOCIN) IVPB 1000 mg/200 mL premix        1,000 mg 200 mL/hr over 60 Minutes Intravenous   Once 10/22/21 1241 10/22/21 1402   10/22/21 1245  ceFEPIme (MAXIPIME) 2 g in sodium chloride 0.9 % 100 mL IVPB        2 g 200 mL/hr over 30 Minutes Intravenous  Once 10/22/21 1241 10/22/21 1321   10/22/21 1230  metroNIDAZOLE (FLAGYL) IVPB 500 mg  Status:  Discontinued        500 mg 100 mL/hr over 60 Minutes Intravenous Every 12 hours 10/22/21 1222 10/24/21 1357       Assessment/Plan: s/p Procedure(s): EXPLORATORY LAPAROTOMY hartmans (N/A) POD#39 - PERFORATED SIGMOID COLON - status post EX LAP, HARTMAN'S PROCEDURE, Gastrostomy tube placement - 9/67/8938 Dr. 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 WOCN following for colostomy and pouching of fistula - CT 5/4 and 5/11 with pelvic fluid collection measuring 6 x 3.2 cm. Discussed with IR and not felt amenable/safe for drainage. -  G-tube clamped 5/7. Patient tolerating. Tolerating ice chips/sips. SLP following  - CT A/P repeated 5/17 due to low grade fever and tachycardia - shows interval decrease in intra abdominal fluid collections. Pleural effusion. Generalized body wall edema.  -CT A/P 5/23 stable to improved diffusely.    -currently eating well with a calorie count in process.  Trying to slow down fistula output with fiber, imodium, etc.  Currently decreasing some despite oral intake.  If this continues, will try to wean TNA more.  Changed to 1/2 rate yesterday.  Leave today to see if she eats better today -cont to follow outputs -will contact IR about pulling their drain and just allowing that to drain out of her fistula or ostomy since this collection has resolved on imaging.   FEN - G-tube  capped, 1/2 rate TPN, diet SOFT VTE - SCDs, SQH ID - zosyn 4/17 - 4/27; 4/28-5/6, 5/8 >>     LOS: 39 days    Henreitta Cea PA-C 11/30/2021

## 2021-11-30 NOTE — Progress Notes (Signed)
PHARMACY - TOTAL PARENTERAL NUTRITION CONSULT NOTE   Indication: Prolonged ileus  Patient Measurements: Height: 5' 10" (177.8 cm) Weight: 65 kg (143 lb 4.8 oz) IBW/kg (Calculated) : 68.5 TPN AdjBW (KG): 53.8 Body mass index is 20.56 kg/m. Usual Weight: 53.8 kg  Assessment: 61 yo F w/ PMH etoh use disorder, smoker, HTN, GERD. S/p Ex Lap Hartmans 10/22/2021 for perforated sigmoid colon.  48 hour calorie count completed on 5/23 - consumed 1055 kcal (52% of est needs) and 39g protein (37% of est needs).  Glucose / Insulin: no hx DM; CBGs continue to be well controlled after stopping SSI Electrolytes:  - Na low at 129     - Co2 trend down to 16 - Magnesium low 1.6 - Phos increase from 3.6 > 4.5 (upper end of range) Vitamins:   - Vitamin C <0.1 (4/22); receiving 200 mg ascorbic acid daily in TPN from [facility] - Vitamin A 14.2 (4/22); receiving 1 mg of vitamin A daily in TPN from [facility] - Unable to add additional ascorbic acid and vitamin A to TPN per TPN policy - Initiated ascorbic acid 500 mg PO BID and vitamin A 10,000 units PO daily.  Renal: SCr stable WNL; BUN elevated but downtrending; UOP 2900 mL In last 24 hr,    Hepatic: LFTs, Tbil, alk phos are WNL; TG stable WNL.  - Albumin remains low - prealbumin 18.2 on 5/13  Intake / Output:  - no MIVF GI Imaging:  - 4/15 CTA: bowel perforation - 4/17 KUB ileus or pSBO - 4/20 CTA: severe ileus or pSBO - 5/04 CT: new pelvic collection not amenable to drainage, likely an abscess - 5/11 CTa/p: fluid collections unchanged from 5/4 - 5/17 abd CT: Central pelvic enhancing fluid collection has decreased in size. Diffusely dilated small bowel containing contrast worrisome for small bowel ileus or partial distal small bowel obstruction. Increasing bilateral pleural effusions. GI Surgeries / Procedures:  - 10/22/21 s/p EXPLORATORY LAPAROTOMY hartmans for perforated sigmoid colon - 4/23 IR placed drain LLQ - 5/10 Drainage cath exchanged - 5/24: LLQ  drain removed  Central access: CVC TL placed 10/23/21 TPN start date: 10/26/21  Nutritional Goals: - goal for ileus: Mag > 2, K > 4, phos ~3  RD Assessment: (5/10) Estimated Needs Total Energy Estimated Needs: 2000-2200 kcal Total Protein Estimated Needs: 105-125 grams Total Fluid Estimated Needs: >/= 2.2 L/day  Current Nutrition:  Soft diet, minimal intake (< 50%) Refusing all supplements Goal concentrated TPN rate is 70 mL/hr (provides 124 g of protein and 2040 kcals per day - 100% of goal)   On 5/22 Dr. Johney Maine updated consult to state  "If patient having greater than 50% calories p.o. and through gastrostomy tube, cut TPN in half.  If meeting 100% goals between p.o. and enteral nutrition, can wean off TPN over 48 hours" - 5/23 Message Ferdinand Cava PA to clarify if rate should be cut in half today since pt ate so well in past 24 hours. PA stated ok to cut rate in half today.  - 5/24: d/w CCS PA - plan to continue half rate TPN. Can reduce further tomorrow if appetite improves (reduced intake on 5/23 due to patient not feeling well, but patient states she does want to eat)   Plan:  Given fluid overload requiring diuresis, concentrating TPN as able.   Now: Magnesium 4g IV x1  At 18:00:   Continue concentrated TPN with clinisol at half-rate (40 mL/hr)  Electrolytes in TPN: Na 154 mEq/L (cannot  incr further d/t stability/compatibility concerns) K 55 mEq/L Ca 4 mEq/L Mg 10 mEq/L (cannot incr further d/t stability/compatibility concerns) Phos 0 mmol/L Cl:Ac 2:1 Continue standard [facility] and trace elements to TPN Continue folic acid & thiamine in TPN Monitor TPN labs on Mon/Thurs Follow up oral intake for appropriate wean off TPN    Dimple Nanas, PharmD 11/30/2021 8:31 AM

## 2021-11-30 NOTE — Progress Notes (Signed)
Daily Progress Note   Patient Name: Jessica [name]       Date: 11/30/2021 DOB: 05-12-1961  Age: 61 y.o. MRN#: [id number] Attending Physician: Caren Griffins, MD Primary Care Physician: Deland Pretty, MD Admit Date: 10/22/2021  Reason for Consultation/Follow-up: Establishing goals of care  Subjective: Patient is resting in bed, denies any complaints, states that she has been moved to the ICU because "they needed to keep extra eye on me. I am good, I am hanging in there.I just need to rest, thank you for checking up on me."    Length of Stay: 39  Current Medications: Scheduled Meds:   vitamin C  500 mg Oral BID   chlorhexidine  15 mL Mouth Rinse BID   Chlorhexidine Gluconate Cloth  6 each Topical Daily   enoxaparin (LOVENOX) injection  40 mg Subcutaneous Q24H   feeding supplement  237 mL Per Tube Q8H   ferrous sulfate  325 mg Oral BID WC   fiber  1 packet Oral BID   gabapentin  300 mg Oral QHS   lip balm  1 application. Topical BID   loperamide  2 mg Oral BID   mouth rinse  15 mL Mouth Rinse q12n4p   megestrol  400 mg Oral BID   metoprolol tartrate  50 mg Oral BID   nicotine  14 mg Transdermal Daily   [START ON 12/01/2021] pantoprazole  40 mg Oral Daily   sodium chloride flush  10-40 mL Intracatheter Q12H   sodium chloride  1 g Oral BID WC   vitamin A  10,000 Units Oral Daily    Continuous Infusions:  sodium chloride 250 mL (11/22/21 0019)   methocarbamol (ROBAXIN) IV     piperacillin-tazobactam (ZOSYN)  IV 3.375 g (11/30/21 0905)   TPN ADULT (ION) 40 mL/hr at 11/30/21 0800   vancomycin Stopped (11/30/21 0749)    PRN Meds: acetaminophen, alum & mag hydroxide-simeth, antiseptic oral rinse, diphenhydrAMINE, diphenhydrAMINE, enalaprilat, fentaNYL (SUBLIMAZE) injection,  hydrALAZINE, ipratropium-albuterol, LORazepam, magic mouthwash, menthol-cetylpyridinium, methocarbamol (ROBAXIN) IV, methocarbamol, metoprolol tartrate, ondansetron (ZOFRAN) IV, oxyCODONE, phenol, prochlorperazine, simethicone, sodium chloride  Physical Exam         Appears chronically ill and frail Awake and alert Abdomen not distended Appears with generalized weakness  Vital Signs: BP (!) 164/79   Pulse 98   Temp  97.9 F (36.6 C) (Oral)   Resp 14   Ht '5\' 10"'$  (1.778 m)   Wt 65 kg   SpO2 99%   BMI 20.56 kg/m  SpO2: SpO2: 99 % O2 Device: O2 Device: Room Air O2 Flow Rate: O2 Flow Rate (L/min): 2 L/min  Intake/output summary:  Intake/Output Summary (Last 24 hours) at 11/30/2021 1044 Last data filed at 11/30/2021 0910 Gross per 24 hour  Intake 1972.37 ml  Output 2850 ml  Net -877.63 ml    LBM: Last BM Date : 11/28/21 Baseline Weight: Weight: 54 kg Most recent weight: Weight: 65 kg       Palliative Assessment/Data:      Patient Active Problem List   Diagnosis Date Noted   Intra-abdominal abscess ([place])    Pelvic abscess - pseudomonas - s/p percutaneous drainage 10/29/2021 11/28/2021   Aspiration pneumonia ([place]) 11/17/2021   Bacteremia due to Klebsiella pneumoniae 11/17/2021   Acute encephalopathy 11/17/2021   Humerus fracture 11/17/2021   Generalized abdominal pain    Sepsis ([place]) 11/09/2021   Postoperative intra-abdominal abscess 11/09/2021   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   Malnutrition of moderate degree 11/07/2021   Palliative care by specialist    Goals of care, counseling/discussion    General weakness    Sinus tachycardia 11/05/2021   Normocytic anemia 11/04/2021   Acute respiratory failure with hypoxia ([place]) 25/11/3974   Acute metabolic encephalopathy 73/41/9379   Hypernatremia, hyponatremia, hypokalemia, hyperphosphatemia 11/03/2021   Hypokalemia 11/03/2021   Hyperphosphatemia 11/03/2021   Hyponatremia 11/03/2021   Elevated brain natriuretic peptide  (BNP) level 11/03/2021   Right proximal humeral fracture 11/03/2021   Oropharyngeal dysphagia 11/03/2021   Colostomy in place [facility]) 10/30/2021   Stricture and stenosis of esophagus 10/30/2021   Gastrostomy tube in place [facility]) 10/30/2021   AKI (acute kidney injury) ([place]) 10/25/2021   Anxiety 10/24/2021   Chronic pain 10/24/2021   Allergic rhinitis 10/24/2021   Primary insomnia 10/24/2021   Tobacco use disorder 10/24/2021   Underweight 10/24/2021   Acquired hammer toe of right foot 10/24/2021   Uncontrolled hypertension 10/24/2021   Urticaria 10/24/2021   Dry mouth 10/24/2021   Protein-calorie malnutrition, severe ([place]) 10/24/2021   Bowel perforation ([place]) 10/22/2021   Substance induced mood disorder ([place])     Palliative Care Assessment & Plan   Patient Profile:    Assessment: 61 year old lady with history of alcohol use disorder, tobacco use, recent right proximal humeral fracture, presented to the hospital at this time with diffuse abdominal pain.  Was found to have sepsis sigmoid perforation, was placed on antibiotics and underwent ex lap with Hartman's procedure and G-tube placement on 10-22-2021.  Postop course complicated by patient requiring reintubation and vasopressors on 4-17 and then she was extubated on 4-20.  Periodically has required Precedex due to agitation and hallucinations.  Found to have postop abdominal abscess requiring drainage on 4-22.  Started on TPN due to tube feed intolerance.  Weaned off of vasopressors as well as Precedex.  Hospital course complicated by feculent output from laparotomy site and repeat CT abdomen on 4-28 with new fluid collection within the cul-de-sac.  Remains on IV antibiotics and TPN.  Interventional radiology and general surgery are following.  Palliative medicine team consulted for ongoing goals of care discussions.  At the time of initial consultation patient had endorsed full code/full scope.  She had stated that she had survived her colon  bursting inside of her and hence wished to continue with any and all means to get better.  Hospital course complicated by patient having new fluid collection in her abdomen on repeat imaging as well as ongoing weakness.  On 5-10 status post fluoroscopy guided exchange upsizing and repositioning of drainage catheter into peristomal collection yielding feculent debris.  Hospital course remains complicated because of ongoing complicated intra-abdominal infection, Pseudomonas infection and recurrent fever.  Surgical colleagues following and infectious disease also following.  Patient recently moved back again to stepdown/ICU for close monitoring.  Recommendations/Plan: Continue current mode of care.  Chart reviewed, scope of current hospitalization noted.  Goals of care discussions undertaken.  Full code/full scope for now.  Patient wishes to continue with current mode of care and states that she does not have any uncontrolled symptoms and does not want to clarify any further goals of care.    Her boyfriend Jessica [name] has been designated as her HCPOA agent as per her wishes, appreciate spiritual care assistance. Monitor hospital course, recommend SNF rehab with palliative.     No further palliative medicine  specific recommendations at this time.  We will follow peripherally.  Goals of Care and Additional Recommendations: Limitations on Scope of Treatment: Full Scope Treatment  Code Status:    Code Status Orders  (From admission, onward)           Start     Ordered   10/22/21 1429  Full code  Continuous        10/22/21 1431           Code Status History     Date Active Date Inactive Code Status Order ID Comments User Context   12/04/2017 2253 12/12/2017 1930 Full Code [id number]  Reyne Dumas, MD ED       Prognosis:  Guarded   Discharge Planning: Recommend skilled nursing facility for rehab with palliative support.  Care plan was discussed with patient and the  interdisciplinary team  Thank you for allowing the Palliative Medicine Team to assist in the care of this patient.  Loistine Chance, MD  Please contact Palliative Medicine Team phone at [phone] for questions and concerns.

## 2021-11-30 NOTE — TOC Progression Note (Addendum)
Transition of Care [facility]) - Progression Note   Patient Details  Name: Jessica [name] MRN: [id number] Date of Birth: 08/29/1960  Transition of Care [facility]) CM/SW [place], LCSW Phone Number: 11/30/2021, 11:10 AM  Clinical Narrative: Patient continues to not have any SNF bed offers at this time. Initial referral faxed out to additional facilities.  Addendum: [name] is willing to take patient with an LOG. CSW spoke with patient regarding SNF in [place]. Patient reported she will consider it, but wants to speak with her caregiver tomorrow before agreeing. TOC supervisor updated.  Expected Discharge Plan: Home/Self Care (TBD) Barriers to Discharge: Continued Medical Work up  Expected Discharge Plan and Services Expected Discharge Plan: Home/Self Care (TBD) Discharge Planning Services: CM Consult Living arrangements for the past 2 months: Single Family Home  Readmission Risk Interventions    10/24/2021   10:10 AM  Readmission Risk Prevention Plan  Transportation Screening Complete  PCP or Specialist Appt within 3-5 Days Complete  HRI or [place] Complete  Social Work Consult for [place] Planning/Counseling Complete  Palliative Care Screening Complete  Medication Review [employer]) Complete

## 2021-11-30 NOTE — Progress Notes (Signed)
Patient ID: Jessica [name], female   DOB: 08/24/1960, 61 y.o.   MRN: [id number] Per order of Dr. Johney Maine, pt's LLQ drain was removed in its entirety without immediate complications. Gauze dressing applied to site. Nurse aware.

## 2021-11-30 NOTE — Progress Notes (Signed)
Nutrition Follow-up  DOCUMENTATION CODES:   Non-severe (moderate) malnutrition in context of chronic illness, Underweight  INTERVENTION:  - continue Ensure Plus High Protein BID and TPN regimen per Surgery team.  - will d/c Calorie Count d/t order for 48 hours has lapsed.    NUTRITION DIAGNOSIS:   Moderate Malnutrition related to chronic illness (alcohol abuse) as evidenced by moderate fat depletion, moderate muscle depletion, severe muscle depletion. -ongoing  GOAL:   Patient will meet greater than or equal to 90% of their needs -met with PO intakes and TPN regimen  MONITOR:   PO intake, Supplement acceptance, Labs, Weight trends, I & O's  ASSESSMENT:   61 year old female with medical history of tobacco abuse and alcohol use disorder with prior severe withdrawal/DT requiring ICU admission and precede. She presented to the ED due to severe, diffuse abdominal pain that began the night prior and constipation x4 days. She reported frequent alcohol ingestion and last drink was the day PTA. In ED she was found to have pneumoperitoneum and free fluid on CT abdomen/pelvis and severe lactic acidosis. She was started on broad spectrum antibiotics and has been given a total of 5L of crystalloid. Surgery was consulted and she was taken to OR where she underwent ex lap and was found to have perforated sigmoid colon, then had hartman's procedure and insertion of g-tube which was left to gravity drainage, JP bulb in pelvis. She was noted to have feculent peritonitis in the pelvis.  Significant Events:  4/15- admission; ex lap with Hartman's procedure with colostomy and insertion of feeding G-tube d/t feculent peritonitis and sigmoid perforation 4/17- re-intubated; OGT insertion; initial RD assessment 4/18- OGT dislodgement 4/19- TPN initiation 4/20- initiation of TF at 20 ml/hr; extubation 4/21- large volume emesis; G-tube clamped and then placed to LIWS; drainage of LLQ fluid collectomy 4/22-  checked vitamins A, B12, C, and D with 0500 labs 4/28- triple lumen PICC placed in L brachial 5/1- re-intubation 5/3- extubation 5/4- CT abdomen/pelvis for r/o fistula 5/5- vitamin A and vitamin C supplementation initiated via G-tube 5/9- CLD via teaspoon only 5/15- Diet advanced to soft 5/20- TPN regimen changed to include Clinisol and decreased goal rate from 95 ml/hr to 70 ml/hr 5/22- Ensure ordered TID via G-tube 5/23- TPN + Clinisol decreased to 40 ml/hr 5/22-5/24- 48 Calorie Count starting at 0829 on 5/22   Patient moved from 4E to 2W yesterday evening. Able to talk with RN. Calorie Count envelope was not moved with patient and no Calorie Count envelope is currently hanging or in physical chart.  Patient laying in bed with no visitors present at the time of RD visit. For lunch patient received a mango ice which she ate half of (45 kcal and 0 grams protein). Visualized breakfast tray which she had consumed ~65% of (324 kcal and 21 grams protein).  Patient shares that she has not had any Ensure via G-tube or orally today. Highly encouraged patient to get at least 1 in today and she is agreeable.   She has a cooler in her room to store foods and beverages in and requested that RD move it to bedside table so it is within reach for her.   She is receiving custom TPN + Clinisol at 40 ml/hr with plan to continue this.   She has not been weighed since 5/22. Mild BLE edema documented in the edema section of flow sheet.   Labs reviewed; CBGs: 107 and 106 mg/dl, Na: 129 mmol/l, BUN: 31 mg/dl,  Ca: 8.2 mg/dl, Mg: 1.6 mg/dl.  Medications reviewed; 500 mg ascorbic acid BID, 325 mg ferrous sulfate BID, 1 packet nutrisource fiber BID, 2 mg imodium BID, 4 g IV Mg sulfate x1 run 5/24, 400 mg megace BID started 5/15, 40 mg oral protonix/day, 1 g NaCl BID, 44034 units vitamin A/day.    Diet Order:   Diet Order             Diet Heart Room service appropriate? Yes; Fluid consistency: Thin  Diet  effective now                   EDUCATION NEEDS:   Education needs have been addressed  Skin:  Skin Assessment: Skin Integrity Issues: Skin Integrity Issues:: DTI, Incisions DTI: vertebral column Incisions: abdomen (4/15 and 4/21)  Last BM:  5/24 (300 ml via colostomy)  Height:   Ht Readings from Last 1 Encounters:  11/08/21 '5\' 10"'  (1.778 m)    Weight:   Wt Readings from Last 1 Encounters:  11/28/21 65 kg     BMI:  Body mass index is 20.56 kg/m.  Estimated Nutritional Needs:  Kcal:  2000-2200 kcal Protein:  105-125 grams Fluid:  >/= 2.2 L/day     Jarome Matin, MS, RD, LDN Registered Dietitian II Inpatient Clinical Nutrition RD pager # and on-call/weekend pager # available in [facility]

## 2021-12-01 LAB — CBC
HCT: 23.6 % — ABNORMAL LOW (ref 36.0–46.0)
Hemoglobin: 7.9 g/dL — ABNORMAL LOW (ref 12.0–15.0)
MCH: 30 pg (ref 26.0–34.0)
MCHC: 33.5 g/dL (ref 30.0–36.0)
MCV: 89.7 fL (ref 80.0–100.0)
Platelets: 246 10*3/uL (ref 150–400)
RBC: 2.63 MIL/uL — ABNORMAL LOW (ref 3.87–5.11)
RDW: 17.4 % — ABNORMAL HIGH (ref 11.5–15.5)
WBC: 7.5 10*3/uL (ref 4.0–10.5)
nRBC: 0 % (ref 0.0–0.2)

## 2021-12-01 LAB — COMPREHENSIVE METABOLIC PANEL
ALT: 29 U/L (ref 0–44)
AST: 20 U/L (ref 15–41)
Albumin: 2.1 g/dL — ABNORMAL LOW (ref 3.5–5.0)
Alkaline Phosphatase: 104 U/L (ref 38–126)
Anion gap: 7 (ref 5–15)
BUN: 34 mg/dL — ABNORMAL HIGH (ref 6–20)
CO2: 17 mmol/L — ABNORMAL LOW (ref 22–32)
Calcium: 8 mg/dL — ABNORMAL LOW (ref 8.9–10.3)
Chloride: 103 mmol/L (ref 98–111)
Creatinine, Ser: 0.95 mg/dL (ref 0.44–1.00)
GFR, Estimated: >60 mL/min (ref >60)
Glucose, Bld: 107 mg/dL — ABNORMAL HIGH (ref 70–99)
Potassium: 4.9 mmol/L (ref 3.5–5.1)
Sodium: 127 mmol/L — ABNORMAL LOW (ref 135–145)
Total Bilirubin: 0.5 mg/dL (ref 0.3–1.2)
Total Protein: 6.2 g/dL — ABNORMAL LOW (ref 6.5–8.1)

## 2021-12-01 LAB — GLUCOSE, CAPILLARY
Glucose-Capillary: 107 mg/dL — ABNORMAL HIGH (ref 70–99)
Glucose-Capillary: 106 mg/dL — ABNORMAL HIGH (ref 70–99)

## 2021-12-01 LAB — PHOSPHORUS: Phosphorus: 4.3 mg/dL (ref 2.5–4.6)

## 2021-12-01 LAB — MAGNESIUM: Magnesium: 2.2 mg/dL (ref 1.7–2.4)

## 2021-12-01 LAB — TRIGLYCERIDES: Triglycerides: 77 mg/dL (ref <150)

## 2021-12-01 MED ORDER — ENSURE ENLIVE PO LIQD
237.0000 mL | Freq: Two times a day (BID) | ORAL | Status: DC
  Administered 2021-12-01 – 2021-12-06 (×9): 237 mL

## 2021-12-01 MED ORDER — LIP MEDEX EX OINT: TOPICAL_OINTMENT | CUTANEOUS | Status: DC | PRN

## 2021-12-01 MED ORDER — LOPERAMIDE HCL 2 MG PO CAPS
4.0000 mg | ORAL_CAPSULE | Freq: Every day | ORAL | Status: DC
Start: 2021-12-02 — End: 2021-12-06
  Administered 2021-12-02 – 2021-12-05 (×4): 4 mg via ORAL
  Filled 2021-12-01 (×5): qty 2

## 2021-12-01 NOTE — Progress Notes (Signed)
PHARMACY - TOTAL PARENTERAL NUTRITION CONSULT NOTE   Indication: Prolonged ileus   Assessment/Plan: TPN to be turned off today. Already running at reduced rate of 40 ml/hr. Just let TPN run until already ordered end time of 1800 then pt will not get a new bag  Adrian Saran, PharmD, BCPS Secure Chat if ?s 12/01/2021 8:46 AM

## 2021-12-01 NOTE — Consult Note (Signed)
[place] Nurse ostomy follow up Spouse is at bedside and assists with pouch changes today.  Stoma type/location:  LUQ colostomy Stomal assessment/size:  1 1/2"  Peristomal assessment:  stomal separation at 9:00, measures 0.3 cm x 1 cm fibrin to wound bed Treatment options for stomal/peristomal skin: barrier ring, stoma powder and skin prep to peristomal skin Output liquid brown stool Ostomy pouching: 2pc. 2 3/4" pouch  Education provided:  Spouse cuts pouch opening and applies barrier ring.  We work together to apply pouch.  He is able to open/close and empty pouch. 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  POuching with Eakin pouch.  Measurement: 12 cm x 5 cm  Wound bed: fistula at distal end. Drainage (amount, consistency, odor) feculent effluent Periwound: LUQ colostomy Dressing procedure/placement/frequency: Barrier rings to perimeter.   Eakin pouch cut off center.  Spouse assists in application.  Will follow.  Domenic Moras MSN, RN, FNP-BC CWON Wound, Ostomy, Continence Nurse Pager [phone]

## 2021-12-01 NOTE — Progress Notes (Signed)
PROGRESS NOTE  TERRACE CHIEM ZHG:[id number] DOB: [birth year] DOA: 10/22/2021 PCP: Deland Pretty, MD   LOS: 40 days   Brief Narrative / Interim history: 61 year old female with history of alcohol abuse, several alcohol withdrawals requiring admissions, tobacco use presented on 10/22/2021 with diffuse abdominal pain with CT of abdomen and pelvis showed pneumoperitoneum and free fluid.  She was started on broad-spectrum antibiotics and emergently taken to the OR and underwent exploratory laparotomy where she was found to have a perforated sigmoid colon.  She is status post Hartmann procedure and insertion of G-tube.  She was found to have Klebsiella bacteremia.  Hospital course complicated by development of left lower quadrant fluid collection status post IR drainage on 10/29/2021, drain cultures grew Pseudomonas.  This required having her drain adjusted and upsized on 510, fluid appeared feculent.  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  She had a repeat CT on 11/10/2021 with pelvic fluid collection measuring 6 x 3.2 cm.  This was discussed with IR and it was not felt safe or amenable to drainage.  She was continued on IV antibiotics.  ID was consulted.  Repeat CT scan showed stable pelvic abscess also remaining unable to be drained.  She is currently on TPN which is being weaned off by general surgery  Subjective / 24h Interval events: She had a good day yesterday, no fever, no chills.  No nausea or vomiting.  Tells me she ate well  Assesement and Plan: Principal Problem:   Colocutaneous fistula Active Problems:   Bowel perforation (HCC)   Acute respiratory failure with hypoxia (HCC)   Acute metabolic encephalopathy   Oropharyngeal dysphagia   Chronic pain   Tobacco use disorder   Protein-calorie malnutrition, severe (HCC)   AKI (acute kidney injury) (HCC)   Hypernatremia, hyponatremia, hypokalemia, hyperphosphatemia   Elevated brain  natriuretic peptide (BNP) level   Right proximal humeral fracture   Normocytic anemia   Substance induced mood disorder (HCC)   Anxiety   Primary insomnia   Underweight   Colostomy in place [facility])   Stricture and stenosis of esophagus   Gastrostomy tube in place [facility])   Hyponatremia   Palliative care by specialist   Goals of care, counseling/discussion   General weakness   Sepsis ([place])   Postoperative intra-abdominal abscess   Generalized abdominal pain   Aspiration pneumonia ([place])   Bacteremia due to Klebsiella pneumoniae   Acute encephalopathy   Humerus fracture   Pelvic abscess - pseudomonas - s/p percutaneous drainage 10/29/2021   Intra-abdominal abscess ([place])   Abscess  Principal problem Complicated intra-abdominal infection, Pseudomonas intra-abdominal abscess infection, recurrent fever-Perforated sigmoid colon status post expiratory laparotomy/Hartman's procedure/G-tube placement.  General surgery as well as infectious disease consulted and following.  Infectious disease consulted and following, currently she is on Zosyn and anticipation is several more weeks of that.  On 5/23 she had an episode of fever, tachycardia, vancomycin was added and she was moved to stepdown.  Blood cultures were sent again on 5/23, if no growth by the end of the day today vancomycin will be discontinued.  Given clinical improvement, move to MedSurg today -She seems to be eating better, surgery is trying to wean off TPN  Active problems Septic shock due to Klebsiella bacteremia -present on admission, initially requiring ICU, now shock physiology has resolved.  As above, currently is on vancomycin and Zosyn. Repeat blood cultures from 11/23/2021 have remained negative.  Given fever 5/23,  repeat blood cultures pending.  DC vancomycin today if cultures remain negative at 48 hours  Acute respiratory failure with hypoxia/aspiration pneumonia-Has required intubation and extubation twice.  Extubated again on  11/08/2021.  Respiratory status is stable, currently she is on room air   Acute metabolic encephalopathy -Multifactorial due to ICU delirium/infectious etiology/alcohol withdrawal.  Resolved, appears back to baseline   AKI -Resolved.   Essential hypertension -on metoprolol, blood pressure acceptable this morning   Anemia of chronic disease -Has required 4 units packed red cells transfusion during the hospitalization with last transfusion being on 11/24/2021 for hemoglobin of 6.9.  Hemoglobin slowly drifting down today, continue to monitor.  Transfuse for less than 7   Hyponatremia -mild, sodium slightly worse at 127 this morning.  Overall stable without large shifts.  Continue to monitor.  Received few doses of Lasix, now on hold.  She appears euvolemic.  She is on salt tabs, continue.  She is on TPN   Severe protein calorie malnutrition/hypoalbuminemia, anorexia -Due to poor oral intake.  Nutrition following  Hypomagnesemia -Resolved   Substance-induced mood disorder/anxiety -Continue lorazepam as needed.   Physical deconditioning -Will need SNF placement   Scheduled Meds:  vitamin C  500 mg Oral BID   chlorhexidine  15 mL Mouth Rinse BID   Chlorhexidine Gluconate Cloth  6 each Topical Daily   enoxaparin (LOVENOX) injection  40 mg Subcutaneous Q24H   feeding supplement  237 mL Per Tube Q8H   ferrous sulfate  325 mg Oral BID WC   fiber  1 packet Oral BID   gabapentin  300 mg Oral QHS   lip balm  1 application. Topical BID   loperamide  2 mg Oral BID   mouth rinse  15 mL Mouth Rinse q12n4p   megestrol  400 mg Per Tube BID   metoprolol tartrate  50 mg Oral BID   nicotine  14 mg Transdermal Daily   pantoprazole  40 mg Oral Daily   sodium chloride flush  10-40 mL Intracatheter Q12H   sodium chloride  1 g Oral BID WC   vitamin A  10,000 Units Oral Daily   Continuous Infusions:  sodium chloride 250 mL (12/01/21 0002)   methocarbamol (ROBAXIN) IV     piperacillin-tazobactam (ZOSYN)   IV 3.375 g (12/01/21 0837)   TPN ADULT (ION) 40 mL/hr at 12/01/21 0800   vancomycin Stopped (12/01/21 0658)   PRN Meds:.acetaminophen, alum & mag hydroxide-simeth, antiseptic oral rinse, diphenhydrAMINE, diphenhydrAMINE, enalaprilat, fentaNYL (SUBLIMAZE) injection, hydrALAZINE, ipratropium-albuterol, LORazepam, magic mouthwash, menthol-cetylpyridinium, methocarbamol (ROBAXIN) IV, methocarbamol, metoprolol tartrate, ondansetron (ZOFRAN) IV, oxyCODONE, phenol, prochlorperazine, simethicone, sodium chloride  Diet Orders (From admission, onward)     Start     Ordered   12/01/21 0757  Diet regular Room service appropriate? Yes; Fluid consistency: Thin  Diet effective now       Question Answer Comment  Room service appropriate? Yes   Fluid consistency: Thin      12/01/21 0756            DVT prophylaxis: enoxaparin (LOVENOX) injection 40 mg Start: 11/27/21 1400 Place and maintain sequential compression device Start: 10/25/21 0954 SCDs Start: 10/22/21 1429   Lab Results  Component Value Date   PLT 246 12/01/2021      Code Status: Full Code  Family Communication: no family at bedside   Status is: Inpatient  Remains inpatient appropriate because: severity of illness  Level of care: Med-Surg  Consultants:  General surgery ID PCCM  Procedures:  none  Objective: Vitals:   12/01/21 0200 12/01/21 0401 12/01/21 0700 12/01/21 0800  BP: 139/70  (!) 117/55 134/78  Pulse: 93  79 90  Resp: '14  11 17  '$ Temp:  98 F (36.7 C)    TempSrc:  Oral    SpO2: 93%  97% 97%  Weight:      Height:        Intake/Output Summary (Last 24 hours) at 12/01/2021 0920 Last data filed at 12/01/2021 0800 Gross per 24 hour  Intake 2836.84 ml  Output 3545 ml  Net -708.16 ml    Wt Readings from Last 3 Encounters:  11/28/21 65 kg  12/11/17 58.6 kg    Examination:  Constitutional: NAD Eyes: lids and conjunctivae normal, no scleral icterus ENMT: mmm Neck: normal, supple Respiratory:  clear to auscultation bilaterally, no wheezing, no crackles. Normal respiratory effort.  Cardiovascular: Regular rate and rhythm, no murmurs / rubs / gallops. No LE edema. Abdomen: Bowel sounds positive.  Skin: no rashes Neurologic: no focal deficits, equal strength   Data Reviewed: I have independently reviewed following labs and imaging studies   CBC Recent Labs  Lab 11/25/21 0040 11/25/21 0046 11/26/21 0341 11/27/21 0504 11/28/21 0504 11/30/21 0500 12/01/21 0500  WBC 7.0  --  5.9 6.9 6.2 7.9 7.5  HGB 8.0*   < > 7.5* 7.9* 8.0* 8.3* 7.9*  HCT 24.5*   < > 22.6* 23.3* 24.2* 25.7* 23.6*  PLT 181  --  180 186 195 248 246  MCV 89.7  --  89.0 88.6 88.6 90.5 89.7  MCH 29.3  --  29.5 30.0 29.3 29.2 30.0  MCHC 32.7  --  33.2 33.9 33.1 32.3 33.5  RDW 16.6*  --  16.8* 17.0* 16.9* 17.2* 17.4*  LYMPHSABS 1.8  --  1.8 1.8 2.0  --   --   MONOABS 0.6  --  0.6 0.8 0.7  --   --   EOSABS 0.6*  --  0.4 0.3 0.2  --   --   BASOSABS 0.0  --  0.0 0.0 0.0  --   --    < > = values in this interval not displayed.     Recent Labs  Lab 11/26/21 0341 11/27/21 0504 11/28/21 0504 11/29/21 0339 11/30/21 0500 12/01/21 0500  NA 129* 131* 131* 131* 129* 127*  K 4.0 4.1 4.3 4.6 4.5 4.9  CL 97* 100 103 107 105 103  CO2 26 24 21* 19* 16* 17*  GLUCOSE 111* 115* 111* 113* 101* 107*  BUN 36* 38* 36* 35* 31* 34*  CREATININE 0.95 0.81 0.87 0.87 0.73 0.95  CALCIUM 7.7* 8.1* 8.2* 7.9* 8.2* 8.0*  AST '24 23 21  '$ --  19 20  ALT '29 30 31  '$ --  28 29  ALKPHOS 130* 130* 129*  --  121 104  BILITOT 0.6 0.7 0.4  --  0.4 0.5  ALBUMIN 1.8* 1.9* 1.9*  --  2.1* 2.1*  MG 2.2 1.9 1.7 1.9 1.6* 2.2     ------------------------------------------------------------------------------------------------------------------ Recent Labs    12/01/21 0500  TRIG 77     No results found for: HGBA1C ------------------------------------------------------------------------------------------------------------------ No results  for input(s): TSH, T4TOTAL, T3FREE, THYROIDAB in the last 72 hours.  Invalid input(s): FREET3  Cardiac Enzymes No results for input(s): CKMB, TROPONINI, MYOGLOBIN in the last 168 hours.  Invalid input(s): CK ------------------------------------------------------------------------------------------------------------------    Component Value Date/Time   BNP 173.1 (H) 11/07/2021 1405    CBG: Recent Labs  Lab 11/30/21  2355 11/30/21 1205 11/30/21 1712 11/30/21 2332 12/01/21 0618  GLUCAP 107* 106* 129* 114* 107*     Recent Results (from the past 240 hour(s))  Culture, blood (Routine X 2) w Reflex to ID Panel     Status: None   Collection Time: 11/23/21 10:51 AM   Specimen: BLOOD  Result Value Ref Range Status   Specimen Description   Final    BLOOD RIGHT ANTECUBITAL Performed at [facility] [address], [place], [place] [address]    Special Requests   Final    IN PEDIATRIC BOTTLE Blood Culture adequate volume Performed at [facility] [address] [place], [place] [address]    Culture   Final    NO GROWTH 5 DAYS Performed at [name], [place] [address] [place], [place] [address]    Report Status 11/28/2021 FINAL  Final  Culture, blood (Routine X 2) w Reflex to ID Panel     Status: None   Collection Time: 11/23/21 11:48 AM   Specimen: BLOOD  Result Value Ref Range Status   Specimen Description   Final    BLOOD BLOOD RIGHT FOREARM Performed at [facility] [address] [place], [place] [address]    Special Requests   Final    BOTTLES DRAWN AEROBIC ONLY Blood Culture adequate volume Performed at [facility] [address], [place], [place] [address]    Culture   Final    NO GROWTH 5 DAYS Performed at [facility], [place] [address], [place], [place] [address]    Report Status 11/28/2021 FINAL  Final  Culture, blood (Routine X 2) w Reflex to ID Panel     Status:  None (Preliminary result)   Collection Time: 11/29/21  4:14 PM   Specimen: BLOOD  Result Value Ref Range Status   Specimen Description   Final    BLOOD RIGHT ANTECUBITAL Performed at [facility] [address] [place], [place] [address]    Special Requests   Final    CORRECTED RESULTS AEROBIC BOTTLE ONLY PREVIOUSLY REPORTED AS: AEROCOCCUS SPECIES CORRECTED RESULTS CALLED TO: PHARMD ELIZABETH [id number] FCP   Culture   Final    NO GROWTH < 24 HOURS Performed at [facility], [place] [address], [place], [place] [address]    Report Status PENDING  Incomplete  Culture, blood (Routine X 2) w Reflex to ID Panel     Status: None (Preliminary result)   Collection Time: 11/29/21  4:19 PM   Specimen: BLOOD  Result Value Ref Range Status   Specimen Description   Final    BLOOD RIGHT ANTECUBITAL LOWER Performed at [facility] [address], [place], [place] [address]    Special Requests   Final    BOTTLES DRAWN AEROBIC ONLY Blood Culture adequate volume Performed at [facility] [address] [place], [place] [address]    Culture   Final    NO GROWTH < 24 HOURS Performed at [facility] [address], [place], [place] [address]    Report Status PENDING  Incomplete  Urine Culture     Status: Abnormal   Collection Time: 11/29/21  6:35 PM   Specimen: Urine, Clean Catch  Result Value Ref Range Status   Specimen Description   Final    URINE, CLEAN CATCH Performed at [facility], [place] [address] [place], [place] [address]  Special Requests   Final    NONE Performed at [facility], [place] [address] [place], [place] [address]    Culture (A)  Final    <10,000 COLONIES/mL INSIGNIFICANT GROWTH Performed at [facility] [address], [place], [place] [address]    Report Status 11/30/2021 FINAL  Final      Radiology Studies: No results found.   Marzetta Board,  MD, PhD [facility]  Between 7 am - 7 pm I am available, please contact me via Amion (for emergencies) or Securechat (non urgent messages)  Between 7 pm - 7 am I am not available, please contact night coverage MD/APP via Amion

## 2021-12-01 NOTE — Progress Notes (Signed)
Physical Therapy Treatment Patient Details Name: Jessica [name] MRN: [id number] DOB: 01/27/1961 Today's Date: 12/01/2021   History of Present Illness Jessica [name] is an 61 y.o. female past medical history significant for alcohol abuse, with admissions for severe alcohol withdrawals requiring ICU admissions tobacco use presented to the ED on 10/22/2021 diffuse abdominal pain that started the day prior to admission.  CT of the abdomen pelvis showed pneumoperitoneum and free fluid, severe sepsis with started on broad-spectrum antibiotics fluid resuscitated surgery was consulted was taken emergently to the OR underwent exploratory laparotomy and was found to have a perforated sigmoid colon, status post Hartmann procedure and insertion of the G-tube which was left to drain to gravity, JP in pelvis was noted to have feculent peritonitis in the pelvis.  ICU course was complicated by Klebsiella bacteremia profound caloric protein malnutrition requiring TPN, acute respiratory failure with hypoxia and tachycardia with failure to progress. patient was transitioned back to ICU on 5/24 with fever.    PT Comments    Pt moving from ICU to [place] with transfer and bed to bed transfer.  Assisted OOB to amb.  General bed mobility comments: increased self ability with use of rails and Boy Friend assisted with upper body. General transfer comment: used a regular RW this session, so pt was a little nervous.  Required increased asisst to rise but did well.General Gait Details: pt amb with regular RW vs EVA this session.  Atfirst nervous but did fine.  BF assisted by following with recliner.  25% VC's to "slow down" and "pace" herself.  Tolerated amb twice.  90 feet twice.  Deconditioned but improving. Positioned in recliner to comfort.   Pt plans to D/C to home with assist from BF Jessica [name].   Recommendations for follow up therapy are one component of a multi-disciplinary discharge planning process, led by the  attending physician.  Recommendations may be updated based on patient status, additional functional criteria and insurance authorization.  Follow Up Recommendations  Home health PT (pt wants to go home)     Assistance Recommended at Discharge Frequent or constant Supervision/Assistance  Patient can return home with the following Two people to help with walking and/or transfers;A lot of help with bathing/dressing/bathroom;Assistance with cooking/housework;Assist for transportation;Help with stairs or ramp for entrance   Equipment Recommendations  None recommended by PT    Recommendations for Other Services       Precautions / Restrictions Precautions Precautions: Fall Precaution Comments: multiple lines--gastrostomy tube, triple lumen central line, colostomy Restrictions Weight Bearing Restrictions: No RUE Weight Bearing: Weight bearing as tolerated Other Position/Activity Restrictions: recent R UE/shoulder fx - assume NWB, though per chart review RUE WB status expired on 10/27/21.     Mobility  Bed Mobility Overal bed mobility: Needs Assistance Bed Mobility: Supine to Sit     Supine to sit: Supervision, Min guard     General bed mobility comments: increased self ability with use of rails and Boy Friend assisted with upper body.    Transfers Overall transfer level: Needs assistance Equipment used: Rolling walker (2 wheels) Transfers: Sit to/from Stand Sit to Stand: Min assist           General transfer comment: used a regular RW this session, so pt was a little nervous.  Required increased asisst to rise but did well.    Ambulation/Gait Ambulation/Gait assistance: Min assist, Min guard Gait Distance (Feet): 180 Feet (90 feet x 2 one sititng rest break) Assistive device: Rolling  walker (2 wheels) Gait Pattern/deviations: Step-through pattern, Decreased stride length, Drifts right/left, Trunk flexed Gait velocity: decreased     General Gait Details: pt amb with  regular RW vs EVA this session.  Atfirst nervous but did fine.  BF assisted by following with recliner.  25% VC's to "slow down" and "pace" herself.  Tolerated amb twice.  90 feet twice.  Deconditioned but improving.   Stairs             Wheelchair Mobility    Modified Rankin (Stroke Patients Only)       Balance                                            Cognition Arousal/Alertness: Awake/alert Behavior During Therapy: WFL for tasks assessed/performed Overall Cognitive Status: Within Functional Limits for tasks assessed                                 General Comments: AxO x 3 increased motivation, wants to go home.        Exercises      General Comments        Pertinent Vitals/Pain Pain Assessment Pain Assessment: Faces Faces Pain Scale: Hurts little more Pain Location: ABD with activity Pain Descriptors / Indicators: Discomfort, Grimacing, Guarding Pain Intervention(s): Monitored during session, Repositioned, Patient requesting pain meds-RN notified    Home Living                          Prior Function            PT Goals (current goals can now be found in the care plan section) Progress towards PT goals: Progressing toward goals    Frequency    Min 2X/week      PT Plan Current plan remains appropriate    Co-evaluation              AM-PAC PT "6 Clicks" Mobility   Outcome Measure  Help needed turning from your back to your side while in a flat bed without using bedrails?: A Little Help needed moving from lying on your back to sitting on the side of a flat bed without using bedrails?: A Little Help needed moving to and from a bed to a chair (including a wheelchair)?: A Little Help needed standing up from a chair using your arms (e.g., wheelchair or bedside chair)?: A Little Help needed to walk in hospital room?: A Little Help needed climbing 3-5 steps with a railing? : A Lot 6 Click Score:  17    End of Session Equipment Utilized During Treatment: Gait belt Activity Tolerance: Patient tolerated treatment well Patient left: in chair;with call bell/phone within reach Nurse Communication: Mobility status PT Visit Diagnosis: Muscle weakness (generalized) (M62.81);History of falling (Z91.81);Difficulty in walking, not elsewhere classified (R26.2);Pain     Time: 1410-1450 PT Time Calculation (min) (ACUTE ONLY): 40 min  Charges:  $Gait Training: 23-37 mins $Therapeutic Activity: 8-22 mins                     Rica Koyanagi  PTA Acute  Rehabilitation Services Pager      [phone] Office      [phone]

## 2021-12-01 NOTE — Progress Notes (Signed)
40 Days Post-Op   Subjective/Chief Complaint: Looks great today.  Ate great yesterday and at least 75-100% of all of her meals.  Likes Ensure so willing to drink that   Objective: Vital signs in last 24 hours: Temp:  [97.9 F (36.6 C)-99.4 F (37.4 C)] 98 F (36.7 C) (05/25 0401) Pulse Rate:  [88-99] 93 (05/25 0200) Resp:  [12-20] 14 (05/25 0200) BP: (117-180)/(45-121) 139/70 (05/25 0200) SpO2:  [93 %-100 %] 93 % (05/25 0200) Last BM Date : 11/28/21  Intake/Output from previous day: 05/24 0701 - 05/25 0700 In: 2758.3 [P.O.:717; I.V.:881.1; IV Piggyback:523.2] Out: 2355 [Urine:3200; Drains:20; Stool:725] Intake/Output this shift: Total I/O In: 232.4 [I.V.:82.4; IV Piggyback:150] Out: -    General appearance: alert and cooperative GI: soft, not really tender. Ostomy with feculent drainage.  Eakin's pouch with only 20cc of output documented yesterday.  IR drain removed  Lab Results:  Recent Labs    11/30/21 0500 12/01/21 0500  WBC 7.9 7.5  HGB 8.3* 7.9*  HCT 25.7* 23.6*  PLT 248 246   BMET Recent Labs    11/30/21 0500 12/01/21 0500  NA 129* 127*  K 4.5 4.9  CL 105 103  CO2 16* 17*  GLUCOSE 101* 107*  BUN 31* 34*  CREATININE 0.73 0.95  CALCIUM 8.2* 8.0*   PT/INR No results for input(s): LABPROT, INR in the last 72 hours. ABG No results for input(s): PHART, HCO3 in the last 72 hours.  Invalid input(s): PCO2, PO2  Studies/Results: CT ABDOMEN PELVIS W CONTRAST  Result Date: 11/29/2021 CLINICAL DATA:  Abdominal pain. 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 Evaluate for undrained abscess. EXAM: CT ABDOMEN AND PELVIS WITH CONTRAST TECHNIQUE: Multidetector CT imaging of the abdomen and pelvis was performed using the standard protocol following bolus administration of intravenous contrast. RADIATION DOSE REDUCTION: This exam was performed according to the departmental dose-optimization program which includes automated  exposure control, adjustment of the mA and/or kV according to patient size and/or use of iterative reconstruction technique. CONTRAST:  41m OMNIPAQUE IOHEXOL 300 MG/ML  SOLN COMPARISON:  11/23/2021 FINDINGS: Lower chest: Unchanged appearance of moderate left pleural effusion. The small right pleural effusion has resolved. Hepatobiliary: Gallstones identified measuring up to 8 mm. No gallbladder wall thickening or bile duct dilatation. No focal liver abnormality. Pancreas: Unremarkable. No pancreatic ductal dilatation or surrounding inflammatory changes. Spleen: Normal in size without focal abnormality. Adrenals/Urinary Tract: The adrenal glands appear normal. No nephrolithiasis, hydronephrosis or kidney mass identified. Diffuse distension of the urinary bladder is again noted. No focal bladder abnormality. Stomach/Bowel: There is a gastrostomy tube within the stomach. 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 Again noted are multiple mildly dilated loops of small bowel with multiple air-fluid levels. These measure up to 3.4 cm in diameter, similar to previous exam. Enteric contrast material is identified within small bowel loops as well as the cecum. Dilute enteric contrast material is noted within the colon up to the level of the colostomy. Vascular/Lymphatic: Aortic atherosclerosis. There is no aneurysm. No abdominopelvic adenopathy. Reproductive: No suspicious mass. Other: There is a percutaneous drainage catheter which enters the ventral pelvis from a left lower quadrant approach, image 61/2. This is unchanged in position from the previous exam the previously identified fluid collection in this area has resolved. -fluid collection in the posterior pelvis measures 3.9 x 2.2 by 2.8 cm (volume = 13 cm^3), image 67/2. This is compared with 4.9 x 2.8 by 3.7 cm (volume =  27 cm^3)previously. -peripherally enhancing fluid collection within the presacral soft tissue space measures 3.4 by 1.3 by  2.8 cm (volume = 6.5 cm^3), image 62/2. Previously this measured 3.5 x 1.3 by 2.7 cm (volume = 6.4 cm^3). No new fluid collections identified. Musculoskeletal: No acute or significant osseous findings. IMPRESSION: 1. Unchanged position of left-sided pigtail catheter. Previously identified fluid collection in this region is resolved. 2. Fluid collection within the posterior pelvis has decreased in volume from previous exam. The second, smaller presacral fluid collection is unchanged. 3. Unchanged appearance of moderate left pleural effusion with resolution of small right pleural effusion. 4. Similar appearance of diffusely increased caliber of the small bowel loops which may represent partial obstruction versus ileus. 5. Cholelithiasis. 6. Aortic Atherosclerosis (ICD10-I70.0). Electronically Signed   By: Kerby Moors M.D.   On: 11/29/2021 10:25   DG CHEST PORT 1 VIEW  Result Date: 11/29/2021 CLINICAL DATA:  [id number]; pain EXAM: PORTABLE CHEST 1 VIEW COMPARISON:  Radiograph dated Nov 23, 2021 FINDINGS: The cardiomediastinal silhouette is unchanged in contour.LEFT upper extremity PICC tip with tip terminating over the superior RIGHT atrium, unchanged. No large pleural effusion. No pneumothorax. No acute pleuroparenchymal abnormality. Visualized abdomen is unremarkable. IMPRESSION: No acute cardiopulmonary abnormality. Electronically Signed   By: Valentino Saxon M.D.   On: 11/29/2021 16:35    Anti-infectives: Anti-infectives (From admission, onward)    Start     Dose/Rate Route Frequency Ordered Stop   11/30/21 0600  vancomycin (VANCOREADY) IVPB 750 mg/150 mL        750 mg 150 mL/hr over 60 Minutes Intravenous Every 12 hours 11/29/21 1640     11/29/21 1700  vancomycin (VANCOREADY) IVPB 1250 mg/250 mL        1,250 mg 166.7 mL/hr over 90 Minutes Intravenous  Once 11/29/21 1605 11/29/21 1841   11/14/21 0800  piperacillin-tazobactam (ZOSYN) IVPB 3.375 g        3.375 g 12.5 mL/hr over 240 Minutes  Intravenous Every 8 hours 11/14/21 0739     11/04/21 1000  piperacillin-tazobactam (ZOSYN) IVPB 3.375 g        3.375 g 12.5 mL/hr over 240 Minutes Intravenous Every 8 hours 11/04/21 0935 11/11/21 1312   10/27/21 1600  cefTRIAXone (ROCEPHIN) 2 g in sodium chloride 0.9 % 100 mL IVPB  Status:  Discontinued        2 g 200 mL/hr over 30 Minutes Intravenous Every 24 hours 10/27/21 0843 10/27/21 1106   10/27/21 1600  piperacillin-tazobactam (ZOSYN) IVPB 3.375 g        3.375 g 12.5 mL/hr over 240 Minutes Intravenous Every 8 hours 10/27/21 1106 11/03/21 2000   10/27/21 1400  metroNIDAZOLE (FLAGYL) IVPB 500 mg  Status:  Discontinued        500 mg 100 mL/hr over 60 Minutes Intravenous Every 12 hours 10/27/21 0843 10/27/21 1106   10/24/21 1600  piperacillin-tazobactam (ZOSYN) IVPB 3.375 g  Status:  Discontinued        3.375 g 12.5 mL/hr over 240 Minutes Intravenous Every 8 hours 10/24/21 1422 10/27/21 0843   10/24/21 1000  cefTRIAXone (ROCEPHIN) 2 g in sodium chloride 0.9 % 100 mL IVPB  Status:  Discontinued        2 g 200 mL/hr over 30 Minutes Intravenous Every 24 hours 10/24/21 0747 10/24/21 1357   10/23/21 1530  vancomycin (VANCOCIN) IVPB 1000 mg/200 mL premix        1,000 mg 200 mL/hr over 60 Minutes Intravenous  Once 10/23/21 1439 10/23/21 1547  10/23/21 0200  ceFEPIme (MAXIPIME) 2 g in sodium chloride 0.9 % 100 mL IVPB  Status:  Discontinued        2 g 200 mL/hr over 30 Minutes Intravenous Every 12 hours 10/22/21 2032 10/24/21 0747   10/22/21 2200  cefOXitin (MEFOXIN) 2 g in sodium chloride 0.9 % 100 mL IVPB  Status:  Discontinued        2 g 200 mL/hr over 30 Minutes Intravenous Every 8 hours 10/22/21 1706 10/22/21 2032   10/22/21 1956  vancomycin variable dose per unstable renal function (pharmacist dosing)  Status:  Discontinued         Does not apply See admin instructions 10/22/21 1957 10/23/21 1917   10/22/21 1245  vancomycin (VANCOCIN) IVPB 1000 mg/200 mL premix        1,000 mg 200  mL/hr over 60 Minutes Intravenous  Once 10/22/21 1241 10/22/21 1402   10/22/21 1245  ceFEPIme (MAXIPIME) 2 g in sodium chloride 0.9 % 100 mL IVPB        2 g 200 mL/hr over 30 Minutes Intravenous  Once 10/22/21 1241 10/22/21 1321   10/22/21 1230  metroNIDAZOLE (FLAGYL) IVPB 500 mg  Status:  Discontinued        500 mg 100 mL/hr over 60 Minutes Intravenous Every 12 hours 10/22/21 1222 10/24/21 1357       Assessment/Plan: s/p Procedure(s): EXPLORATORY LAPAROTOMY hartmans (N/A) POD#40 - PERFORATED SIGMOID COLON - status post EX LAP, HARTMAN'S PROCEDURE, Gastrostomy tube placement - 0/30/0923 Dr. 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 WOCN following for colostomy and pouching of fistula - CT 5/4 and 5/11 with pelvic fluid collection measuring 6 x 3.2 cm. Discussed with IR and not felt amenable/safe for drainage. -  G-tube clamped 5/7. Patient tolerating. Tolerating ice chips/sips. SLP following  - CT A/P repeated 5/17 due to low grade fever and tachycardia - shows interval decrease in intra abdominal fluid collections. Pleural effusion. Generalized body wall edema.  -CT A/P 5/23 stable to improved diffusely.    -ate great yesterday.  Will drink Ensure as able between meals.  Discussed sipping on it especially if she is eating most of her meals, she may still be too full to drink a whole ensure.  On days where she doesn't eat as much, she can drink more of the Ensure -DC TNA today -IR drain removed on 5/24 -eakin's pouch output minimal -ostomy around 700cc yesterday -ok for med-surg bed   FEN - G-tube capped, Dc TPN, diet SOFT VTE - SCDs, SQH ID -  zosyn 4/17 - 4/27; 4/28-5/6, 5/8 >> (4-6 weeks per ID)     LOS: 40 days    Henreitta Cea PA-C 12/01/2021

## 2021-12-01 NOTE — Progress Notes (Signed)
[facility] for Infectious Disease  Date of Admission:  10/22/2021           Reason for visit: Follow up on intra abdominal infection  Current antibiotics: Vanc 5/23-c Zosyn 4/17-5/5; 5/8 - present   Prior antibiotics: Ceftriaxone 4/17 Cefepime 4/15 - 4/17 Metronidazole 4/15 - 4/17 Vancomycin 4/15 - 4/16   Lines: 4/28-c lue picc   ASSESSMENT:    61 y.o. female admitted with:  Complicated intra-abdominal infection: Patient presented with septic shock due to Klebsiella bacteremia from a perforated sigmoid colon on 4/15.  Status post Hartman/colostomy procedure with G-tube placement at that time.  This was complicated by colocutaneous fistula formation and intra-abdominal abscess.  Status post IR drain placement into this fluid collection 4/22 with cultures growing Pseudomonas.  Another CT scan obtained 5/4 notable for a second pelvic fluid collection measuring 6 x 3.2 cm unable to be drained by IR.  On 5/10, she underwent drain upsize into the peri-stomal collection yielding feculent debris.  This residual abscess cavity was noted to have a persistent fistulous connection with her open midline abdominal wound.  She had repeat CT 5/17 showing resolution of fluid collection with catheter in place.  Her undrained pelvic collection was decreased in size now measuing 2.8x5.2x3.8cm.  She has other enhancing fluid collections in the presacral space and pelvic sidewall that were not signficantly changed in size.  Repeat abd ct 5/23 showed much improvement. The pigtail catheter on the left abd quadrant removed 5/24 as that abscess had collapsed 5/23 she spiked another fever; briefly transferred to icu for monitoring due to tachycardia as well; no shock. Repeat bcx negative as of 5/25; fever isolated and she had been feeling well and wanting to eat since its onset. Repeat 5/23 abd/pelv ct doesn't show new abscess. We can stop vanc today   RECOMMENDATIONS:    Stop vancomycin Continue  pip-tazo; will plan it through June 2023  Repeat abd/pelv imaging in 3rd week of June to determine further duration of antibiotics I am happy she is able to be weaning off tpn Discussed with primary team   I spent more than 35 minute reviewing data/chart, and coordinating care and >50% direct face to face time providing counseling/discussing diagnostics/treatment plan with patient     Principal Problem:   Colocutaneous fistula Active Problems:   Substance induced mood disorder (HCC)   Bowel perforation (HCC)   Anxiety   Chronic pain   Primary insomnia   Tobacco use disorder   Underweight   Protein-calorie malnutrition, severe ([place])   Colostomy in place ([place])   AKI (acute kidney injury) ([place])   Stricture and stenosis of esophagus   Gastrostomy tube in place [facility])   Acute respiratory failure with hypoxia ([place])   Acute metabolic encephalopathy   Hypernatremia, hyponatremia, hypokalemia, hyperphosphatemia   Hyponatremia   Elevated brain natriuretic peptide (BNP) level   Right proximal humeral fracture   Oropharyngeal dysphagia   Normocytic anemia   Palliative care by specialist   Goals of care, counseling/discussion   General weakness   Sepsis ([place])   Postoperative intra-abdominal abscess   Generalized abdominal pain   Aspiration pneumonia ([place])   Bacteremia due to Klebsiella pneumoniae   Acute encephalopathy   Humerus fracture   Pelvic abscess - pseudomonas - s/p percutaneous drainage 10/29/2021   Intra-abdominal abscess (HCC)   Abscess    MEDICATIONS:    Scheduled Meds:  vitamin C  500 mg Oral BID   chlorhexidine  15  mL Mouth Rinse BID   Chlorhexidine Gluconate Cloth  6 each Topical Daily   enoxaparin (LOVENOX) injection  40 mg Subcutaneous Q24H   feeding supplement  237 mL Per Tube BID BM   ferrous sulfate  325 mg Oral BID WC   fiber  1 packet Oral BID   gabapentin  300 mg Oral QHS   lip balm  1 application. Topical BID   [START ON 12/02/2021] loperamide  4  mg Oral QHS   mouth rinse  15 mL Mouth Rinse q12n4p   megestrol  400 mg Per Tube BID   metoprolol tartrate  50 mg Oral BID   nicotine  14 mg Transdermal Daily   pantoprazole  40 mg Oral Daily   sodium chloride flush  10-40 mL Intracatheter Q12H   sodium chloride  1 g Oral BID WC   vitamin A  10,000 Units Oral Daily   Continuous Infusions:  sodium chloride 250 mL (12/01/21 0002)   methocarbamol (ROBAXIN) IV     piperacillin-tazobactam (ZOSYN)  IV 3.375 g (12/01/21 0837)   TPN ADULT (ION) 40 mL/hr at 12/01/21 0800   PRN Meds:.acetaminophen, alum & mag hydroxide-simeth, antiseptic oral rinse, diphenhydrAMINE, diphenhydrAMINE, enalaprilat, fentaNYL (SUBLIMAZE) injection, hydrALAZINE, ipratropium-albuterol, LORazepam, magic mouthwash, menthol-cetylpyridinium, methocarbamol (ROBAXIN) IV, methocarbamol, metoprolol tartrate, ondansetron (ZOFRAN) IV, oxyCODONE, phenol, prochlorperazine, simethicone, sodium chloride  SUBJECTIVE:   24 hour events:  No fever last almost 48 hours. Felt well. Wants to eat extra yogurt now No increased osteomy output No rash/joint pain No dyspnea  Making jokes with me throughout this morning interview  Review of Systems  All other systems reviewed and are negative.    OBJECTIVE:   Blood pressure (!) 154/81, pulse 85, temperature 98.4 F (36.9 C), temperature source Oral, resp. rate 17, height '5\' 10"'$  (1.778 m), weight 65 kg, SpO2 97 %. Body mass index is 20.56 kg/m.    General/constitutional: no distress, pleasant, conversant, jovial HEENT: Normocephalic, PER, Conj Clear, EOMI, Oropharynx clear Neck supple CV: rrr no mrg Lungs: clear to auscultation, normal respiratory effort Abd: Soft, Nontender Ext: no edema Skin: No Rash Neuro: nonfocal  Central line presence: LUE picc no erythema     Lab Results: Lab Results  Component Value Date   WBC 7.5 12/01/2021   HGB 7.9 (L) 12/01/2021   HCT 23.6 (L) 12/01/2021   MCV 89.7 12/01/2021   PLT 246  12/01/2021    Lab Results  Component Value Date   NA 127 (L) 12/01/2021   K 4.9 12/01/2021   CO2 17 (L) 12/01/2021   GLUCOSE 107 (H) 12/01/2021   BUN 34 (H) 12/01/2021   CREATININE 0.95 12/01/2021   CALCIUM 8.0 (L) 12/01/2021   GFRNONAA >60 12/01/2021   GFRAA >60 12/12/2017    Lab Results  Component Value Date   ALT 29 12/01/2021   AST 20 12/01/2021   ALKPHOS 104 12/01/2021   BILITOT 0.5 12/01/2021    No results found for: CRP  No results found for: ESRSEDRATE   I have reviewed the micro and lab results in Epic.  Imaging: DG CHEST PORT 1 VIEW  Result Date: 11/29/2021 CLINICAL DATA:  [id number]; pain EXAM: PORTABLE CHEST 1 VIEW COMPARISON:  Radiograph dated Nov 23, 2021 FINDINGS: The cardiomediastinal silhouette is unchanged in contour.LEFT upper extremity PICC tip with tip terminating over the superior RIGHT atrium, unchanged. No large pleural effusion. No pneumothorax. No acute pleuroparenchymal abnormality. Visualized abdomen is unremarkable. IMPRESSION: No acute cardiopulmonary abnormality. Electronically Signed   By: Colletta [place]  Peacock M.D.   On: 11/29/2021 16:35     Imaging independently reviewed in Epic.    5/17 ct abd pelv with contrast 1. Left-sided pigtail catheter in place. Previously identified fluid collection in this region has resolved. 2. Central pelvic enhancing fluid collection has decreased in size. Other pelvic fluid collections are stable. 3. Diffusely dilated small bowel containing contrast worrisome for small bowel ileus or partial distal small bowel obstruction. 4. Increasing body wall edema. 5. Again seen is a left lower quadrant colostomy. 6. Increasing bilateral pleural effusions. 7. Cholelithiasis with stable gallbladder wall edema. Correlate clinically for cholecystitis. 8.  Aortic Atherosclerosis   5/23 ct abd pelv 1. Unchanged position of left-sided pigtail catheter. Previously identified fluid collection in this region is resolved. 2.  Fluid collection within the posterior pelvis has decreased in volume from previous exam. The second, smaller presacral fluid collection is unchanged. 3. Unchanged appearance of moderate left pleural effusion with resolution of small right pleural effusion. 4. Similar appearance of diffusely increased caliber of the small bowel loops which may represent partial obstruction versus ileus. 5. Cholelithiasis. 6. Aortic Atherosclerosis  Jabier Mutton, [facility] for [facility]ious [place] [phone]  pager   [phone] cell 12/01/2021, 11:42 AM

## 2021-12-02 LAB — CBC
HCT: 23.4 % — ABNORMAL LOW (ref 36.0–46.0)
Hemoglobin: 7.6 g/dL — ABNORMAL LOW (ref 12.0–15.0)
MCH: 29.3 pg (ref 26.0–34.0)
MCHC: 32.5 g/dL (ref 30.0–36.0)
MCV: 90.3 fL (ref 80.0–100.0)
Platelets: 266 10*3/uL (ref 150–400)
RBC: 2.59 MIL/uL — ABNORMAL LOW (ref 3.87–5.11)
RDW: 17.4 % — ABNORMAL HIGH (ref 11.5–15.5)
WBC: 7.9 10*3/uL (ref 4.0–10.5)
nRBC: 0 % (ref 0.0–0.2)

## 2021-12-02 LAB — BASIC METABOLIC PANEL
Anion gap: 5 (ref 5–15)
BUN: 30 mg/dL — ABNORMAL HIGH (ref 6–20)
CO2: 17 mmol/L — ABNORMAL LOW (ref 22–32)
Calcium: 8 mg/dL — ABNORMAL LOW (ref 8.9–10.3)
Chloride: 105 mmol/L (ref 98–111)
Creatinine, Ser: 0.8 mg/dL (ref 0.44–1.00)
GFR, Estimated: >60 mL/min (ref >60)
Glucose, Bld: 97 mg/dL (ref 70–99)
Potassium: 4.6 mmol/L (ref 3.5–5.1)
Sodium: 127 mmol/L — ABNORMAL LOW (ref 135–145)

## 2021-12-02 LAB — MAGNESIUM: Magnesium: 1.8 mg/dL (ref 1.7–2.4)

## 2021-12-02 MED ORDER — LORAZEPAM 2 MG/ML IJ SOLN
0.5000 mg | Freq: Two times a day (BID) | INTRAMUSCULAR | Status: DC | PRN
  Administered 2021-12-02 – 2021-12-04 (×3): 0.5 mg via INTRAVENOUS
  Filled 2021-12-02 (×3): qty 1

## 2021-12-02 MED ORDER — SODIUM CHLORIDE 0.9 % IV SOLN
125.0000 mg | Freq: Once | INTRAVENOUS | Status: AC
  Administered 2021-12-02: 125 mg via INTRAVENOUS
  Filled 2021-12-02: qty 10

## 2021-12-02 MED ORDER — CALCIUM POLYCARBOPHIL 625 MG PO TABS
625.0000 mg | ORAL_TABLET | Freq: Two times a day (BID) | ORAL | Status: DC
  Administered 2021-12-02 – 2021-12-06 (×9): 625 mg via ORAL
  Filled 2021-12-02 (×9): qty 1

## 2021-12-02 NOTE — TOC Progression Note (Addendum)
Transition of Care [facility]) - Progression Note    Patient Details  Name: Jessica [name] MRN: [id number] Date of Birth: 03-23-1961  Transition [facility]) CM/SW Contact  Lennart Pall, LCSW Phone Number: 12/02/2021, 10:47 AM  Clinical Narrative:    Have spoken with Saverio Danker, PA and MD who are now anticipating pt may be medically ready for dc home early next week.  Pt now wants to plan for dc home instead of SNF and confirms that she will have support of friend, [name] Martinique who has already been participating in education with colostomy care. Have spoken with Mr. Martinique who confirms he will be providing 24/7 assistance to pt at dc.  Both confirm she will dc to address on record.   Have spoken with Bradd Canary with [facility] who are providing "charity" services this week and she will check if they can accept for charity [facility] services.   Have also alerted Carolynn Sayers, RN with [employer] of need for home IV abx.  ADDENDUM: [facility] unable to accept but have secured HHRN coverage now via [employer] (may add HHPT if still needed at dc).  At this point, should have coverage for [facility] and [place] IV abx via [employer] and [employer].  MDs aware.    Expected Discharge Plan: Home/Self Care (TBD) Barriers to Discharge: Continued Medical Work up, Inadequate or no insurance, SNF Pending bed offer  Expected Discharge Plan and Services Expected Discharge Plan: Home/Self Care (TBD)   Discharge Planning Services: CM Consult   Living arrangements for the past 2 months: Single Family Home                                       Social Determinants of Health (SDOH) Interventions    Readmission Risk Interventions    10/24/2021   10:10 AM  Readmission Risk Prevention Plan  Transportation Screening Complete  PCP or Specialist Appt within 3-5 Days Complete  HRI or [place] Complete  Social Work Consult for [place] Planning/Counseling Complete  Palliative Care Screening Complete   Medication Review [employer]) Complete

## 2021-12-02 NOTE — Progress Notes (Signed)
Patient had some complaint about not getting her needs met on time.  Especially medications.  It was during the time where both her bout and ostomy was leaking at the same time. And the area needed to be cleaned as to not infect the G-Tube before giving medication the area.  Also nurse was also in the middle of discharging a patient.  Pt's needs were then met, and she was given everything she asked for as soon as possible.  She was grateful and slept even while dressings ostomy pouch was was being changed.  But it seems like this morning she now has some complaint.

## 2021-12-02 NOTE — Progress Notes (Signed)
Physical Therapy Treatment Patient Details Name: Jessica [name] MRN: [id number] DOB: October 11, 1960 Today's Date: 12/02/2021   History of Present Illness Jessica [name] is an 61 y.o. female past medical history significant for alcohol abuse, with admissions for severe alcohol withdrawals requiring ICU admissions tobacco use presented to the ED on 10/22/2021 diffuse abdominal pain that started the day prior to admission.  CT of the abdomen pelvis showed pneumoperitoneum and free fluid, severe sepsis with started on broad-spectrum antibiotics fluid resuscitated surgery was consulted was taken emergently to the OR underwent exploratory laparotomy and was found to have a perforated sigmoid colon, status post Hartmann procedure and insertion of the G-tube which was left to drain to gravity, JP in pelvis was noted to have feculent peritonitis in the pelvis.  ICU course was complicated by Klebsiella bacteremia profound caloric protein malnutrition requiring TPN, acute respiratory failure with hypoxia and tachycardia with failure to progress. patient was transitioned back to ICU on 5/24 with fever.    PT Comments    Pt progressing well.  Assisted OOB to amb in hallway.  General transfer comment: used a regular RW this session, so pt was a little nervous.  Required increased asisst to rise but did well.  Also assisted with BSC transfer with VC's on safety with turns and proper hand tranfer.  Pt starting to get more fatigued at this point.  Required Min Assist to rise that last time.  B LE present with tremors. Positioned in recliner upright as lunch arrived.   Pt plans to D/C to home "some time" next week. She is excited.   Recommendations for follow up therapy are one component of a multi-disciplinary discharge planning process, led by the attending physician.  Recommendations may be updated based on patient status, additional functional criteria and insurance authorization.  Follow Up Recommendations  Home  health PT     Assistance Recommended at Discharge Frequent or constant Supervision/Assistance  Patient can return home with the following Two people to help with walking and/or transfers;A lot of help with bathing/dressing/bathroom;Assistance with cooking/housework;Assist for transportation;Help with stairs or ramp for entrance   Equipment Recommendations  None recommended by PT (BF has a walker she can use)    Recommendations for Other Services       Precautions / Restrictions Precautions Precautions: Fall Precaution Comments: multiple lines--gastrostomy tube, triple lumen central line, colostomy Restrictions Weight Bearing Restrictions: No RUE Weight Bearing: Weight bearing as tolerated Other Position/Activity Restrictions: recent R UE/shoulder fx - assume NWB, though per chart review RUE WB status expired on 10/27/21.     Mobility  Bed Mobility Overal bed mobility: Needs Assistance Bed Mobility: Supine to Sit     Supine to sit: Supervision     General bed mobility comments: increased self ability with use of rails    Transfers Overall transfer level: Needs assistance Equipment used: Rolling walker (2 wheels) Transfers: Sit to/from Stand Sit to Stand: Min guard, Supervision           General transfer comment: used a regular RW this session, so pt was a little nervous.  Required increased asisst to rise but did well.  Also assisted with BSC transfer with VC's on safety with turns and proper hand tranfer.  Pt starting to get more fatigued at this point.  Required Min Assist to rise that last time.  B LE present with tremors.    Ambulation/Gait Ambulation/Gait assistance: Min guard, Min assist Gait Distance (Feet): 225 Feet Assistive device: Rolling walker (2  wheels) Gait Pattern/deviations: Step-through pattern, Decreased stride length, Drifts right/left, Trunk flexed Gait velocity: WNL     General Gait Details: pt tolerated an increased distance using RW with x  2 standing rest breaks.  Progressing well with her mobility.   Stairs             Wheelchair Mobility    Modified Rankin (Stroke Patients Only)       Balance                                            Cognition Arousal/Alertness: Awake/alert Behavior During Therapy: WFL for tasks assessed/performed Overall Cognitive Status: Within Functional Limits for tasks assessed                                 General Comments: AxO x 3 increased motivation, wants to go home.        Exercises      General Comments        Pertinent Vitals/Pain Pain Assessment Pain Assessment: Faces Faces Pain Scale: Hurts little more Pain Location: R shoulder with increased activity Pain Descriptors / Indicators: Discomfort, Grimacing, Guarding Pain Intervention(s): Monitored during session, Premedicated before session, Repositioned    Home Living                          Prior Function            PT Goals (current goals can now be found in the care plan section) Progress towards PT goals: Progressing toward goals    Frequency    Min 2X/week      PT Plan Current plan remains appropriate    Co-evaluation              AM-PAC PT "6 Clicks" Mobility   Outcome Measure  Help needed turning from your back to your side while in a flat bed without using bedrails?: A Little Help needed moving from lying on your back to sitting on the side of a flat bed without using bedrails?: A Little Help needed moving to and from a bed to a chair (including a wheelchair)?: A Little Help needed standing up from a chair using your arms (e.g., wheelchair or bedside chair)?: A Little Help needed to walk in hospital room?: A Little Help needed climbing 3-5 steps with a railing? : A Little 6 Click Score: 18    End of Session Equipment Utilized During Treatment: Gait belt Activity Tolerance: Patient tolerated treatment well Patient left: in  chair;with call bell/phone within reach Nurse Communication: Mobility status PT Visit Diagnosis: Muscle weakness (generalized) (M62.81);History of falling (Z91.81);Difficulty in walking, not elsewhere classified (R26.2);Pain Pain - Right/Left: Right     Time: 2836-6294 PT Time Calculation (min) (ACUTE ONLY): 32 min  Charges:  $Gait Training: 8-22 mins $Therapeutic Activity: 8-22 mins                     Rica Koyanagi  PTA Acute  Rehabilitation Services Pager      [phone] Office      [phone]

## 2021-12-02 NOTE — Progress Notes (Signed)
PHARMACY CONSULT NOTE FOR:  OUTPATIENT  PARENTERAL ANTIBIOTIC THERAPY (OPAT)  Indication: Intra-abd abscesses Regimen:  Zosyn 13.5g CI every 24 hours End date: 12/30/21  IV antibiotic discharge orders are pended. To discharging provider:  please sign these orders via discharge navigator,  Select New Orders & click on the button choice - Manage This Unsigned Work.    Thank you for allowing pharmacy to be a part of this patient's care.  Alycia Rossetti, PharmD, BCPS Infectious Diseases Clinical Pharmacist 12/02/2021 11:05 AM   **Pharmacist phone directory can now be found on [email or web address] ([email or web address]).  Listed under [place].

## 2021-12-02 NOTE — Progress Notes (Signed)
PROGRESS NOTE  Jessica [name] HCW:[id number] DOB: Nov 30, 1960 DOA: 10/22/2021 PCP: Deland Pretty, MD   LOS: 41 days   Brief Narrative / Interim history: 61 year old female with history of alcohol abuse, several alcohol withdrawals requiring admissions, tobacco use presented on 10/22/2021 with diffuse abdominal pain with CT of abdomen and pelvis showed pneumoperitoneum and free fluid.  She was started on broad-spectrum antibiotics and emergently taken to the OR and underwent exploratory laparotomy where she was found to have a perforated sigmoid colon.  She is status post Hartmann procedure and insertion of G-tube.  She was found to have Klebsiella bacteremia.  Hospital course complicated by development of left lower quadrant fluid collection status post IR drainage on 10/29/2021, drain cultures grew Pseudomonas.  This required having her drain adjusted and upsized on 510, fluid appeared feculent.  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  She had a repeat CT on 11/10/2021 with pelvic fluid collection measuring 6 x 3.2 cm.  This was discussed with IR and it was not felt safe or amenable to drainage.  She was continued on IV antibiotics.  ID was consulted.  Repeat CT scan showed stable pelvic abscess also remaining unable to be drained.  She is currently on TPN which is being weaned off by general surgery  Subjective / 24h Interval events: Overnight had some issues with the nursing staff, but this morning clinically she feels good.  She is able to eat, no significant difficulties  Assesement and Plan: Principal Problem:   Colocutaneous fistula Active Problems:   Bowel perforation (HCC)   Acute respiratory failure with hypoxia (HCC)   Acute metabolic encephalopathy   Oropharyngeal dysphagia   Chronic pain   Tobacco use disorder   Protein-calorie malnutrition, severe (HCC)   AKI (acute kidney injury) (HCC)   Hypernatremia, hyponatremia,  hypokalemia, hyperphosphatemia   Elevated brain natriuretic peptide (BNP) level   Right proximal humeral fracture   Normocytic anemia   Substance induced mood disorder (HCC)   Anxiety   Primary insomnia   Underweight   Colostomy in place [facility])   Stricture and stenosis of esophagus   Gastrostomy tube in place [facility])   Hyponatremia   Palliative care by specialist   Goals of care, counseling/discussion   General weakness   Sepsis ([place])   Postoperative intra-abdominal abscess   Generalized abdominal pain   Aspiration pneumonia ([place])   Bacteremia due to Klebsiella pneumoniae   Acute encephalopathy   Humerus fracture   Pelvic abscess - pseudomonas - s/p percutaneous drainage 10/29/2021   Intra-abdominal abscess ([place])   Abscess  Principal problem Complicated intra-abdominal infection, Pseudomonas intra-abdominal abscess infection, recurrent fever-Perforated sigmoid colon status post expiratory laparotomy/Hartman's procedure/G-tube placement.  She also has a colostomy.  There was concern about high output in her Jeanette Caprice pouch, was placed on Imodium, FiberCon, with improved output.  She was initially on TPN but now has been weaned off as she is gradually eating better and better.  Colostomy is working well.  ID consulted due to her Pseudomonas intra-abdominal abscess infections, and currently she is on Zosyn.  Repeat CT scan on 5/24 shows improvement.  Overall currently she is getting better, tolerating p.o. intake, and ID recommends Zosyn IV 13.5 g continuous infusion every 24 hours with end date 12/30/2021.  Now working on placement  Active problems Septic shock due to Klebsiella bacteremia -present on admission, initially requiring ICU, now shock physiology has resolved.  As above, currently is  on vancomycin and Zosyn. Repeat blood cultures from 11/23/2021 have remained negative.  Had another fever 5/23, repeat blood cultures have remained negative.  Acute respiratory failure with  hypoxia/aspiration pneumonia-Has required intubation and extubation twice.  Extubated again on 11/08/2021.  She is on room air currently   Acute metabolic encephalopathy -Multifactorial due to ICU delirium/infectious etiology/alcohol withdrawal.  Resolved.  Back to baseline   AKI -Resolved.   Essential hypertension -on metoprolol, blood pressure acceptable   Anemia of chronic disease -Has required 4 units packed red cells transfusion during the hospitalization with last transfusion being on 11/24/2021 for hemoglobin of 6.9.  Hemoglobin still drifting down.  Getting iron.  Monitor, transfuse for less than 7   Hyponatremia -mild, sodium 127, stable.  No large shifts.  Continue to monitor.  Salt tabs  Severe protein calorie malnutrition/hypoalbuminemia, anorexia -Due to poor oral intake.  Nutrition following  Hypomagnesemia -Resolved   Substance-induced mood disorder/anxiety -Continue lorazepam as needed.   Physical deconditioning -seems to doing better, PT recommends home health PT.  Social worker working to obtain PT as she is uninsured   Scheduled Meds:  vitamin C  500 mg Oral BID   chlorhexidine  15 mL Mouth Rinse BID   Chlorhexidine Gluconate Cloth  6 each Topical Daily   enoxaparin (LOVENOX) injection  40 mg Subcutaneous Q24H   feeding supplement  237 mL Per Tube BID BM   ferrous sulfate  325 mg Oral BID WC   gabapentin  300 mg Oral QHS   lip balm  1 application. Topical BID   loperamide  4 mg Oral QHS   mouth rinse  15 mL Mouth Rinse q12n4p   metoprolol tartrate  50 mg Oral BID   nicotine  14 mg Transdermal Daily   pantoprazole  40 mg Oral Daily   polycarbophil  625 mg Oral BID   sodium chloride flush  10-40 mL Intracatheter Q12H   sodium chloride  1 g Oral BID WC   vitamin A  10,000 Units Oral Daily   Continuous Infusions:  sodium chloride 250 mL (12/01/21 0002)   ferric gluconate (FERRLECIT) IVPB     methocarbamol (ROBAXIN) IV     piperacillin-tazobactam (ZOSYN)  IV  3.375 g (12/02/21 0853)   PRN Meds:.acetaminophen, alum & mag hydroxide-simeth, antiseptic oral rinse, diphenhydrAMINE, diphenhydrAMINE, enalaprilat, fentaNYL (SUBLIMAZE) injection, hydrALAZINE, ipratropium-albuterol, lip balm, LORazepam, magic mouthwash, menthol-cetylpyridinium, methocarbamol (ROBAXIN) IV, methocarbamol, metoprolol tartrate, ondansetron (ZOFRAN) IV, oxyCODONE, phenol, prochlorperazine, simethicone, sodium chloride  Diet Orders (From admission, onward)     Start     Ordered   12/01/21 0757  Diet regular Room service appropriate? Yes; Fluid consistency: Thin  Diet effective now       Question Answer Comment  Room service appropriate? Yes   Fluid consistency: Thin      12/01/21 0756            DVT prophylaxis: enoxaparin (LOVENOX) injection 40 mg Start: 11/27/21 1400 Place and maintain sequential compression device Start: 10/25/21 0954 SCDs Start: 10/22/21 1429   Lab Results  Component Value Date   PLT 266 12/02/2021      Code Status: Full Code  Family Communication: no family at bedside   Status is: Inpatient  Remains inpatient appropriate because: severity of illness  Level of care: Med-Surg  Consultants:  General surgery ID PCCM  Procedures:  none  Objective: Vitals:   12/01/21 1300 12/01/21 1419 12/01/21 2044 12/02/21 0651  BP: (!) 182/81 (!) 147/86 (!) 155/75 (!) 149/83  Pulse: 83 84 91 82  Resp: '16 18 18   '$ Temp:  98.6 F (37 C) 98.8 F (37.1 C) 98.4 F (36.9 C)  TempSrc:  Oral Oral Oral  SpO2: 99% 93% 99% 100%  Weight:      Height:        Intake/Output Summary (Last 24 hours) at 12/02/2021 1126 Last data filed at 12/02/2021 1000 Gross per 24 hour  Intake 1513.77 ml  Output 3700 ml  Net -2186.23 ml    Wt Readings from Last 3 Encounters:  11/28/21 65 kg  12/11/17 58.6 kg    Examination: Constitutional: NAD Eyes: lids and conjunctivae normal, no scleral icterus ENMT: mmm Neck: normal, supple Respiratory: clear to  auscultation bilaterally, no wheezing, no crackles. Normal respiratory effort.  Cardiovascular: Regular rate and rhythm, no murmurs / rubs / gallops. No LE edema. Abdomen: Bowel sounds positive.  Skin: no rashes Neurologic: no focal deficits, equal strength  Data Reviewed: I have independently reviewed following labs and imaging studies   CBC Recent Labs  Lab 11/26/21 0341 11/27/21 0504 11/28/21 0504 11/30/21 0500 12/01/21 0500 12/02/21 0222  WBC 5.9 6.9 6.2 7.9 7.5 7.9  HGB 7.5* 7.9* 8.0* 8.3* 7.9* 7.6*  HCT 22.6* 23.3* 24.2* 25.7* 23.6* 23.4*  PLT 180 186 195 248 246 266  MCV 89.0 88.6 88.6 90.5 89.7 90.3  MCH 29.5 30.0 29.3 29.2 30.0 29.3  MCHC 33.2 33.9 33.1 32.3 33.5 32.5  RDW 16.8* 17.0* 16.9* 17.2* 17.4* 17.4*  LYMPHSABS 1.8 1.8 2.0  --   --   --   MONOABS 0.6 0.8 0.7  --   --   --   EOSABS 0.4 0.3 0.2  --   --   --   BASOSABS 0.0 0.0 0.0  --   --   --      Recent Labs  Lab 11/26/21 0341 11/27/21 0504 11/28/21 0504 11/29/21 0339 11/30/21 0500 12/01/21 0500 12/02/21 0222  NA 129* 131* 131* 131* 129* 127* 127*  K 4.0 4.1 4.3 4.6 4.5 4.9 4.6  CL 97* 100 103 107 105 103 105  CO2 26 24 21* 19* 16* 17* 17*  GLUCOSE 111* 115* 111* 113* 101* 107* 97  BUN 36* 38* 36* 35* 31* 34* 30*  CREATININE 0.95 0.81 0.87 0.87 0.73 0.95 0.80  CALCIUM 7.7* 8.1* 8.2* 7.9* 8.2* 8.0* 8.0*  AST '24 23 21  '$ --  19 20  --   ALT '29 30 31  '$ --  28 29  --   ALKPHOS 130* 130* 129*  --  121 104  --   BILITOT 0.6 0.7 0.4  --  0.4 0.5  --   ALBUMIN 1.8* 1.9* 1.9*  --  2.1* 2.1*  --   MG 2.2 1.9 1.7 1.9 1.6* 2.2 1.8     ------------------------------------------------------------------------------------------------------------------ Recent Labs    12/01/21 0500  TRIG 77     No results found for: HGBA1C ------------------------------------------------------------------------------------------------------------------ No results for input(s): TSH, T4TOTAL, T3FREE, THYROIDAB in the  last 72 hours.  Invalid input(s): FREET3  Cardiac Enzymes No results for input(s): CKMB, TROPONINI, MYOGLOBIN in the last 168 hours.  Invalid input(s): CK ------------------------------------------------------------------------------------------------------------------    Component Value Date/Time   BNP 173.1 (H) 11/07/2021 1405    CBG: Recent Labs  Lab 11/30/21 1205 11/30/21 1712 11/30/21 2332 12/01/21 0618 12/01/21 1237  GLUCAP 106* 129* 114* 107* 106*     Recent Results (from the past 240 hour(s))  Culture, blood (Routine X 2) w Reflex  to ID Panel     Status: None   Collection Time: 11/23/21 10:51 AM   Specimen: BLOOD  Result Value Ref Range Status   Specimen Description   Final    BLOOD RIGHT ANTECUBITAL Performed at [place] [address], [place], [place] [address]    Special Requests   Final    IN PEDIATRIC BOTTLE Blood Culture adequate volume Performed at [place] [facility] [address] [place], [place] [address]    Culture   Final    NO GROWTH 5 DAYS Performed at [facility], [place] [address], [place], [place] [address]    Report Status 11/28/2021 FINAL  Final  Culture, blood (Routine X 2) w Reflex to ID Panel     Status: None   Collection Time: 11/23/21 11:48 AM   Specimen: BLOOD  Result Value Ref Range Status   Specimen Description   Final    BLOOD BLOOD RIGHT FOREARM Performed at [place] [address] [place], [place] [address]    Special Requests   Final    BOTTLES DRAWN AEROBIC ONLY Blood Culture adequate volume Performed at [place] [address] [place], [place] [address]    Culture   Final    NO GROWTH 5 DAYS Performed at [facility], [place] [address], [place], [place] [address]    Report Status 11/28/2021 FINAL  Final  Culture, blood (Routine X 2) w Reflex to ID Panel     Status: None (Preliminary result)   Collection Time: 11/29/21   4:14 PM   Specimen: BLOOD  Result Value Ref Range Status   Specimen Description   Final    BLOOD RIGHT ANTECUBITAL Performed at [place] [address], [place], [place] [address]    Special Requests   Final    CORRECTED RESULTS AEROBIC BOTTLE ONLY PREVIOUSLY REPORTED AS: AEROCOCCUS SPECIES CORRECTED RESULTS CALLED TO: PHARMD ELIZABETH [id number] FCP   Culture   Final    NO GROWTH 3 DAYS Performed at [facility], [place] [address] [place], [place] [address]    Report Status PENDING  Incomplete  Culture, blood (Routine X 2) w Reflex to ID Panel     Status: None (Preliminary result)   Collection Time: 11/29/21  4:19 PM   Specimen: BLOOD  Result Value Ref Range Status   Specimen Description   Final    BLOOD RIGHT ANTECUBITAL LOWER Performed at [place] [place] [address], [place], [place] [address]    Special Requests   Final    BOTTLES DRAWN AEROBIC ONLY Blood Culture adequate volume Performed at [facility] [place] [address], [place], [place] [address]    Culture   Final    NO GROWTH 3 DAYS Performed at [facility], [place] [address], [place], [place] [address]    Report Status PENDING  Incomplete  Urine Culture     Status: Abnormal   Collection Time: 11/29/21  6:35 PM   Specimen: Urine, Clean Catch  Result Value Ref Range Status   Specimen Description   Final    URINE, CLEAN CATCH Performed at [facility], [place] [address], [place], [place] [address]    Special Requests   Final    NONE Performed at [facility], [place] [address], [place],  [address]    Culture (A)  Final    <10,000 COLONIES/mL INSIGNIFICANT  GROWTH Performed at [facility], [place] [address] [place], [place] [address]    Report Status 11/30/2021 FINAL  Final      Radiology Studies: No results found.   Marzetta Board, MD, PhD [facility]  Between 7 am - 7 pm I am  available, please contact me via Amion (for emergencies) or Securechat (non urgent messages)  Between 7 pm - 7 am I am not available, please contact night coverage MD/APP via Amion

## 2021-12-02 NOTE — Progress Notes (Signed)
41 Days Post-Op   Subjective/Chief Complaint: Had a very tough night with staff.  But medically still doing well.  Eating well still.     Objective: Vital signs in last 24 hours: Temp:  [98.1 F (36.7 C)-98.8 F (37.1 C)] 98.4 F (36.9 C) (05/26 0651) Pulse Rate:  [82-91] 82 (05/26 0651) Resp:  [16-18] 18 (05/25 2044) BP: (145-182)/(66-86) 149/83 (05/26 0651) SpO2:  [93 %-100 %] 100 % (05/26 0651) Last BM Date : 12/01/21  Intake/Output from previous day: 05/25 0701 - 05/26 0700 In: 1655.6 [P.O.:840; I.V.:492; IV Piggyback:223.6] Out: 3800 [Urine:3200; Stool:600] Intake/Output this shift: No intake/output data recorded.   General appearance: alert and cooperative GI: soft, not really tender. Ostomy with feculent drainage, 600cc output yesterday.  Eakin's pouch with about 20cc of output in bag.    Lab Results:  Recent Labs    12/01/21 0500 12/02/21 0222  WBC 7.5 7.9  HGB 7.9* 7.6*  HCT 23.6* 23.4*  PLT 246 266   BMET Recent Labs    12/01/21 0500 12/02/21 0222  NA 127* 127*  K 4.9 4.6  CL 103 105  CO2 17* 17*  GLUCOSE 107* 97  BUN 34* 30*  CREATININE 0.95 0.80  CALCIUM 8.0* 8.0*   PT/INR No results for input(s): LABPROT, INR in the last 72 hours. ABG No results for input(s): PHART, HCO3 in the last 72 hours.  Invalid input(s): PCO2, PO2  Studies/Results: No results found.  Anti-infectives: Anti-infectives (From admission, onward)    Start     Dose/Rate Route Frequency Ordered Stop   11/30/21 0600  vancomycin (VANCOREADY) IVPB 750 mg/150 mL  Status:  Discontinued        750 mg 150 mL/hr over 60 Minutes Intravenous Every 12 hours 11/29/21 1640 12/01/21 1021   11/29/21 1700  vancomycin (VANCOREADY) IVPB 1250 mg/250 mL        1,250 mg 166.7 mL/hr over 90 Minutes Intravenous  Once 11/29/21 1605 11/29/21 1841   11/14/21 0800  piperacillin-tazobactam (ZOSYN) IVPB 3.375 g        3.375 g 12.5 mL/hr over 240 Minutes Intravenous Every 8 hours 11/14/21 0739      11/04/21 1000  piperacillin-tazobactam (ZOSYN) IVPB 3.375 g        3.375 g 12.5 mL/hr over 240 Minutes Intravenous Every 8 hours 11/04/21 0935 11/11/21 1312   10/27/21 1600  cefTRIAXone (ROCEPHIN) 2 g in sodium chloride 0.9 % 100 mL IVPB  Status:  Discontinued        2 g 200 mL/hr over 30 Minutes Intravenous Every 24 hours 10/27/21 0843 10/27/21 1106   10/27/21 1600  piperacillin-tazobactam (ZOSYN) IVPB 3.375 g        3.375 g 12.5 mL/hr over 240 Minutes Intravenous Every 8 hours 10/27/21 1106 11/03/21 2000   10/27/21 1400  metroNIDAZOLE (FLAGYL) IVPB 500 mg  Status:  Discontinued        500 mg 100 mL/hr over 60 Minutes Intravenous Every 12 hours 10/27/21 0843 10/27/21 1106   10/24/21 1600  piperacillin-tazobactam (ZOSYN) IVPB 3.375 g  Status:  Discontinued        3.375 g 12.5 mL/hr over 240 Minutes Intravenous Every 8 hours 10/24/21 1422 10/27/21 0843   10/24/21 1000  cefTRIAXone (ROCEPHIN) 2 g in sodium chloride 0.9 % 100 mL IVPB  Status:  Discontinued        2 g 200 mL/hr over 30 Minutes Intravenous Every 24 hours 10/24/21 0747 10/24/21 1357   10/23/21 1530  vancomycin (VANCOCIN) IVPB  1000 mg/200 mL premix        1,000 mg 200 mL/hr over 60 Minutes Intravenous  Once 10/23/21 1439 10/23/21 1547   10/23/21 0200  ceFEPIme (MAXIPIME) 2 g in sodium chloride 0.9 % 100 mL IVPB  Status:  Discontinued        2 g 200 mL/hr over 30 Minutes Intravenous Every 12 hours 10/22/21 2032 10/24/21 0747   10/22/21 2200  cefOXitin (MEFOXIN) 2 g in sodium chloride 0.9 % 100 mL IVPB  Status:  Discontinued        2 g 200 mL/hr over 30 Minutes Intravenous Every 8 hours 10/22/21 1706 10/22/21 2032   10/22/21 1956  vancomycin variable dose per unstable renal function (pharmacist dosing)  Status:  Discontinued         Does not apply See admin instructions 10/22/21 1957 10/23/21 1917   10/22/21 1245  vancomycin (VANCOCIN) IVPB 1000 mg/200 mL premix        1,000 mg 200 mL/hr over 60 Minutes Intravenous  Once  10/22/21 1241 10/22/21 1402   10/22/21 1245  ceFEPIme (MAXIPIME) 2 g in sodium chloride 0.9 % 100 mL IVPB        2 g 200 mL/hr over 30 Minutes Intravenous  Once 10/22/21 1241 10/22/21 1321   10/22/21 1230  metroNIDAZOLE (FLAGYL) IVPB 500 mg  Status:  Discontinued        500 mg 100 mL/hr over 60 Minutes Intravenous Every 12 hours 10/22/21 1222 10/24/21 1357       Assessment/Plan: s/p Procedure(s): EXPLORATORY LAPAROTOMY hartmans (N/A) POD#41 - PERFORATED SIGMOID COLON - status post EX LAP, HARTMAN'S PROCEDURE, Gastrostomy tube placement - 7/68/0881 Dr. 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 WOCN following for colostomy and pouching of fistula - CT 5/4 and 5/11 with pelvic fluid collection measuring 6 x 3.2 cm. Discussed with IR and not felt amenable/safe for drainage. -  G-tube clamped 5/7. Patient tolerating. Tolerating ice chips/sips. SLP following  - CT A/P repeated 5/17 due to low grade fever and tachycardia - shows interval decrease in intra abdominal fluid collections. Pleural effusion. Generalized body wall edema.  -CT A/P 5/23 stable to improved diffusely.    -ate great.  Will drink Ensure as able between meals.   -DC TNA 5/25 -IR drain removed on 5/24 -eakin's pouch output minimal -ostomy around 600cc yesterday -d/w primary and ID about starting to plan for DC dispo which is likely home -[facility] PT/OT/RN orders written today   FEN - G-tube capped, diet SOFT VTE - SCDs, SQH ID - zosyn 4/17 - 4/27; 4/28-5/6, 5/8 >> (4-6 weeks per ID)     LOS: 41 days    Henreitta Cea PA-C 12/02/2021

## 2021-12-02 NOTE — Progress Notes (Signed)
Current antibiotics: Zosyn 4/17-5/5; 5/8 - present   Prior antibiotics: Ceftriaxone 4/17 Cefepime 4/15 - 4/17 Metronidazole 4/15 - 4/17 Vancomycin 4/15 - 4/16; 5/23-25   Diagnosis: Intraabdominal abscess due to sigmoid colon perforation Kleb oxytoca bacteremia 4/15 Pseudomonas aeruginosa (S cipro) on abd abscess fresh drain 4/22 S/p hartman procedure S/p g-tube placement  Most recent repeat abd/pelv ct 5/23 showed improvement of previous abscesses with one resolved abscess where the drain was placed (removed 5/25)   Patient awaiting disposition as of 5/26  Duration of abx a moving target. Will try to avoid quinolone longterm, so will keep on iv abx for the next 4 weeks  Would recommend to get abd pelv ct with contrast again in 2-3 weeks from today (2nd week of June)  ID follow up as below   No Known Allergies  OPAT Orders Discharge antibiotics to be given via PICC line Discharge antibiotics: piptazo  Duration/end date 4 weeks until 6/23  [facility] Care Per Protocol:  Home health RN for IV administration and teaching; PICC line care and labs.    Labs weekly while on IV antibiotics: _x_ CBC with differential __ BMP _x_ CMP _x_ CRP __ ESR __ Vancomycin trough __ CK  _x_ Please pull PIC at completion of IV antibiotics __ Please leave PIC in place until doctor has seen patient or been notified  Fax weekly labs to [phone]  Clinic Follow Up Appt: 6/15 @ 1030  @  [facility] [place], [place], [place] [address] Phone: [phone]

## 2021-12-03 LAB — CBC
HCT: 22.8 % — ABNORMAL LOW (ref 36.0–46.0)
Hemoglobin: 7.7 g/dL — ABNORMAL LOW (ref 12.0–15.0)
MCH: 30 pg (ref 26.0–34.0)
MCHC: 33.8 g/dL (ref 30.0–36.0)
MCV: 88.7 fL (ref 80.0–100.0)
Platelets: 267 10*3/uL (ref 150–400)
RBC: 2.57 MIL/uL — ABNORMAL LOW (ref 3.87–5.11)
RDW: 17.7 % — ABNORMAL HIGH (ref 11.5–15.5)
WBC: 6.8 10*3/uL (ref 4.0–10.5)
nRBC: 0 % (ref 0.0–0.2)

## 2021-12-03 LAB — BASIC METABOLIC PANEL
Anion gap: 6 (ref 5–15)
BUN: 25 mg/dL — ABNORMAL HIGH (ref 6–20)
CO2: 17 mmol/L — ABNORMAL LOW (ref 22–32)
Calcium: 8 mg/dL — ABNORMAL LOW (ref 8.9–10.3)
Chloride: 102 mmol/L (ref 98–111)
Creatinine, Ser: 0.72 mg/dL (ref 0.44–1.00)
GFR, Estimated: >60 mL/min (ref >60)
Glucose, Bld: 101 mg/dL — ABNORMAL HIGH (ref 70–99)
Potassium: 4.2 mmol/L (ref 3.5–5.1)
Sodium: 125 mmol/L — ABNORMAL LOW (ref 135–145)

## 2021-12-03 MED ORDER — SODIUM CHLORIDE 1 G PO TABS
1.0000 g | ORAL_TABLET | Freq: Three times a day (TID) | ORAL | Status: DC
  Filled 2021-12-03: qty 1

## 2021-12-03 NOTE — Progress Notes (Signed)
42 Days Post-Op   Subjective/Chief Complaint: No new issues   Objective: Vital signs in last 24 hours: Temp:  [98.4 F (36.9 C)-100.2 F (37.9 C)] 100.2 F (37.9 C) (05/27 0451) Pulse Rate:  [88-90] 88 (05/27 0451) Resp:  [16-18] 16 (05/27 0451) BP: (138-158)/(73-95) 138/77 (05/27 0451) SpO2:  [99 %-100 %] 99 % (05/27 0451) Last BM Date : 12/02/21  Intake/Output from previous day: 05/26 0701 - 05/27 0700 In: 1453.3 [P.O.:960; IV Piggyback:443.3] Out: 3900 [Urine:3350; Stool:550] Intake/Output this shift: No intake/output data recorded.  General appearance: alert and cooperative GI: soft, not really tender. Ostomy with feculent output, 550cc output yesterday.  Eakin's pouch with minimal output in bag.    Lab Results:  Recent Labs    12/02/21 0222 12/03/21 0414  WBC 7.9 6.8  HGB 7.6* 7.7*  HCT 23.4* 22.8*  PLT 266 267   BMET Recent Labs    12/02/21 0222 12/03/21 0414  NA 127* 125*  K 4.6 4.2  CL 105 102  CO2 17* 17*  GLUCOSE 97 101*  BUN 30* 25*  CREATININE 0.80 0.72  CALCIUM 8.0* 8.0*   PT/INR No results for input(s): LABPROT, INR in the last 72 hours. ABG No results for input(s): PHART, HCO3 in the last 72 hours.  Invalid input(s): PCO2, PO2  Studies/Results: No results found.  Anti-infectives: Anti-infectives (From admission, onward)    Start     Dose/Rate Route Frequency Ordered Stop   11/30/21 0600  vancomycin (VANCOREADY) IVPB 750 mg/150 mL  Status:  Discontinued        750 mg 150 mL/hr over 60 Minutes Intravenous Every 12 hours 11/29/21 1640 12/01/21 1021   11/29/21 1700  vancomycin (VANCOREADY) IVPB 1250 mg/250 mL        1,250 mg 166.7 mL/hr over 90 Minutes Intravenous  Once 11/29/21 1605 11/29/21 1841   11/14/21 0800  piperacillin-tazobactam (ZOSYN) IVPB 3.375 g        3.375 g 12.5 mL/hr over 240 Minutes Intravenous Every 8 hours 11/14/21 0739     11/04/21 1000  piperacillin-tazobactam (ZOSYN) IVPB 3.375 g        3.375 g 12.5 mL/hr  over 240 Minutes Intravenous Every 8 hours 11/04/21 0935 11/11/21 1312   10/27/21 1600  cefTRIAXone (ROCEPHIN) 2 g in sodium chloride 0.9 % 100 mL IVPB  Status:  Discontinued        2 g 200 mL/hr over 30 Minutes Intravenous Every 24 hours 10/27/21 0843 10/27/21 1106   10/27/21 1600  piperacillin-tazobactam (ZOSYN) IVPB 3.375 g        3.375 g 12.5 mL/hr over 240 Minutes Intravenous Every 8 hours 10/27/21 1106 11/03/21 2000   10/27/21 1400  metroNIDAZOLE (FLAGYL) IVPB 500 mg  Status:  Discontinued        500 mg 100 mL/hr over 60 Minutes Intravenous Every 12 hours 10/27/21 0843 10/27/21 1106   10/24/21 1600  piperacillin-tazobactam (ZOSYN) IVPB 3.375 g  Status:  Discontinued        3.375 g 12.5 mL/hr over 240 Minutes Intravenous Every 8 hours 10/24/21 1422 10/27/21 0843   10/24/21 1000  cefTRIAXone (ROCEPHIN) 2 g in sodium chloride 0.9 % 100 mL IVPB  Status:  Discontinued        2 g 200 mL/hr over 30 Minutes Intravenous Every 24 hours 10/24/21 0747 10/24/21 1357   10/23/21 1530  vancomycin (VANCOCIN) IVPB 1000 mg/200 mL premix        1,000 mg 200 mL/hr over 60 Minutes Intravenous  Once 10/23/21 1439 10/23/21 1547   10/23/21 0200  ceFEPIme (MAXIPIME) 2 g in sodium chloride 0.9 % 100 mL IVPB  Status:  Discontinued        2 g 200 mL/hr over 30 Minutes Intravenous Every 12 hours 10/22/21 2032 10/24/21 0747   10/22/21 2200  cefOXitin (MEFOXIN) 2 g in sodium chloride 0.9 % 100 mL IVPB  Status:  Discontinued        2 g 200 mL/hr over 30 Minutes Intravenous Every 8 hours 10/22/21 1706 10/22/21 2032   10/22/21 1956  vancomycin variable dose per unstable renal function (pharmacist dosing)  Status:  Discontinued         Does not apply See admin instructions 10/22/21 1957 10/23/21 1917   10/22/21 1245  vancomycin (VANCOCIN) IVPB 1000 mg/200 mL premix        1,000 mg 200 mL/hr over 60 Minutes Intravenous  Once 10/22/21 1241 10/22/21 1402   10/22/21 1245  ceFEPIme (MAXIPIME) 2 g in sodium chloride 0.9 %  100 mL IVPB        2 g 200 mL/hr over 30 Minutes Intravenous  Once 10/22/21 1241 10/22/21 1321   10/22/21 1230  metroNIDAZOLE (FLAGYL) IVPB 500 mg  Status:  Discontinued        500 mg 100 mL/hr over 60 Minutes Intravenous Every 12 hours 10/22/21 1222 10/24/21 1357       Assessment/Plan: s/p Procedure(s): EXPLORATORY LAPAROTOMY hartmans (N/A) POD#42 - PERFORATED SIGMOID COLON - status post EX LAP, HARTMAN'S PROCEDURE, Gastrostomy tube placement - 3/74/8270 Dr. 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 WOCN following for colostomy and pouching of fistula - CT 5/4 and 5/11 with pelvic fluid collection measuring 6 x 3.2 cm. Discussed with IR and not felt amenable/safe for drainage. -  G-tube clamped 5/7. Patient tolerating. Tolerating ice chips/sips. SLP following  - CT A/P repeated 5/17 due to low grade fever and tachycardia - shows interval decrease in intra abdominal fluid collections. Pleural effusion. Generalized body wall edema.  -CT A/P 5/23 stable to improved diffusely.     -Better PO intake.  Will drink Ensure as able between meals.   -DC TNA 5/25 -IR drain removed on 5/24 -eakin's pouch output minimal -ostomy around 550cc yesterday -d/w primary and ID about starting to plan for DC dispo which is likely home early next week -HH PT/OT/RN orders written today   FEN - G-tube capped, diet SOFT VTE - SCDs, SQH ID - zosyn 4/17 - 4/27; 4/28-5/6, 5/8 >> (4-6 weeks per ID)      LOS: 42 days    Maia Petties 12/03/2021

## 2021-12-03 NOTE — Progress Notes (Signed)
PROGRESS NOTE  Jessica [name] MPN:[id number] DOB: Nov 15, 1960 DOA: 10/22/2021 PCP: Deland Pretty, MD   LOS: 42 days   Brief Narrative / Interim history: 61 year old female with history of alcohol abuse, several alcohol withdrawals requiring admissions, tobacco use presented on 10/22/2021 with diffuse abdominal pain with CT of abdomen and pelvis showed pneumoperitoneum and free fluid.  She was started on broad-spectrum antibiotics and emergently taken to the OR and underwent exploratory laparotomy where she was found to have a perforated sigmoid colon.  She is status post Hartmann procedure and insertion of G-tube.  She was found to have Klebsiella bacteremia.  Hospital course complicated by development of left lower quadrant fluid collection status post IR drainage on 10/29/2021, drain cultures grew Pseudomonas. This required having her drain adjusted and upsized on 510, fluid appeared feculent.  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 She had a repeat CT on 11/10/2021 with pelvic fluid collection measuring 6 x 3.2 cm.  This was discussed with IR and it was not felt safe or amenable to drainage.  ID was consulted and she was continued on IV antibiotics. Repeat CT scan showed stable and improving pelvic abscess.  Currently plans are to continue IV Zosyn for at least the next 4 weeks with a repeat CT scan in the next 2 to 3 weeks.  Disposition pending  Subjective / 24h Interval events: Feeling better this morning, complains of consistent thirst and drinking a lot of water.  She does not like the salt tabs.  Assesement and Plan: Principal Problem:   Colocutaneous fistula Active Problems:   Bowel perforation (HCC)   Acute respiratory failure with hypoxia (HCC)   Acute metabolic encephalopathy   Oropharyngeal dysphagia   Chronic pain   Tobacco use disorder   Protein-calorie malnutrition, severe (HCC)   AKI (acute kidney injury) (HCC)    Hypernatremia, hyponatremia, hypokalemia, hyperphosphatemia   Elevated brain natriuretic peptide (BNP) level   Right proximal humeral fracture   Normocytic anemia   Substance induced mood disorder (HCC)   Anxiety   Primary insomnia   Underweight   Colostomy in place [facility])   Stricture and stenosis of esophagus   Gastrostomy tube in place [facility])   Hyponatremia   Palliative care by specialist   Goals of care, counseling/discussion   General weakness   Sepsis ([place])   Postoperative intra-abdominal abscess   Generalized abdominal pain   Aspiration pneumonia ([place])   Bacteremia due to Klebsiella pneumoniae   Acute encephalopathy   Humerus fracture   Pelvic abscess - pseudomonas - s/p percutaneous drainage 10/29/2021   Intra-abdominal abscess ([place])   Abscess  Principal problem Complicated intra-abdominal infection, Pseudomonas intra-abdominal abscess infection, recurrent fever-Perforated sigmoid colon status post expiratory laparotomy/Hartman's procedure/G-tube placement.  She also has a colostomy.  There was concern about high output in her fistula pouch, was placed on Imodium, FiberCon, with improved output.  She was initially on TPN but now has been weaned off as she is gradually eating better and better.  Colostomy is working well.  ID consulted due to her Pseudomonas intra-abdominal abscess infections, and currently she is on Zosyn.  Repeat CT scan on 5/24 shows improvement.  Overall currently she is getting better, tolerating p.o. intake, and ID recommends Zosyn IV 13.5 g continuous infusion every 24 hours with end date 12/30/2021.  Now working on placement  Active problems Septic shock due to Klebsiella bacteremia -present on admission, initially requiring ICU, now shock physiology  has resolved.  As above, currently is on vancomycin and Zosyn. Repeat blood cultures from 11/23/2021 have remained negative.  Had another fever 5/23, repeat blood cultures have remained negative.  Acute  respiratory failure with hypoxia/aspiration pneumonia-Has required intubation and extubation twice.  Extubated again on 11/08/2021.  She is on room air currently   Acute metabolic encephalopathy -Multifactorial due to ICU delirium/infectious etiology/alcohol withdrawal.  Resolved.  Back to baseline   AKI -Resolved.   Essential hypertension -on metoprolol, blood pressure acceptable   Anemia of chronic disease -Has required 4 units packed red cells transfusion during the hospitalization with last transfusion being on 11/24/2021 for hemoglobin of 6.9.  Hemoglobin still drifting down.  Getting iron.  Monitor, transfuse for less than 7   Hyponatremia -slightly getting worse, sodium 125 this morning.  Patient tells me she is drinking large amounts of water due to consistent thirst.  She does not like the salt tabs, discontinue today.  I believe her hyponatremia is more related to her free water intake, start fluid restriction today.  Closely monitor.  Severe protein calorie malnutrition/hypoalbuminemia, anorexia -Due to poor oral intake.  Nutrition following  Hypomagnesemia -Resolved   Substance-induced mood disorder/anxiety -Continue lorazepam as needed.   Physical deconditioning -seems to doing better, PT recommends home health PT.  Social worker working to obtain PT as she is uninsured   Scheduled Meds:  vitamin C  500 mg Oral BID   chlorhexidine  15 mL Mouth Rinse BID   Chlorhexidine Gluconate Cloth  6 each Topical Daily   enoxaparin (LOVENOX) injection  40 mg Subcutaneous Q24H   feeding supplement  237 mL Per Tube BID BM   ferrous sulfate  325 mg Oral BID WC   gabapentin  300 mg Oral QHS   lip balm  1 application. Topical BID   loperamide  4 mg Oral QHS   mouth rinse  15 mL Mouth Rinse q12n4p   metoprolol tartrate  50 mg Oral BID   nicotine  14 mg Transdermal Daily   pantoprazole  40 mg Oral Daily   polycarbophil  625 mg Oral BID   sodium chloride flush  10-40 mL Intracatheter Q12H    vitamin A  10,000 Units Oral Daily   Continuous Infusions:  sodium chloride 250 mL (12/01/21 0002)   methocarbamol (ROBAXIN) IV     piperacillin-tazobactam (ZOSYN)  IV 3.375 g (12/03/21 0756)   PRN Meds:.acetaminophen, alum & mag hydroxide-simeth, antiseptic oral rinse, diphenhydrAMINE, diphenhydrAMINE, enalaprilat, fentaNYL (SUBLIMAZE) injection, hydrALAZINE, ipratropium-albuterol, lip balm, LORazepam, magic mouthwash, menthol-cetylpyridinium, methocarbamol (ROBAXIN) IV, methocarbamol, metoprolol tartrate, ondansetron (ZOFRAN) IV, oxyCODONE, phenol, prochlorperazine, simethicone, sodium chloride  Diet Orders (From admission, onward)     Start     Ordered   12/03/21 0925  Diet regular Room service appropriate? Yes; Fluid consistency: Thin; Fluid restriction: 1800 mL Fluid  Diet effective now       Question Answer Comment  Room service appropriate? Yes   Fluid consistency: Thin   Fluid restriction: 1800 mL Fluid      12/03/21 0924            DVT prophylaxis: enoxaparin (LOVENOX) injection 40 mg Start: 11/27/21 1400 Place and maintain sequential compression device Start: 10/25/21 0954 SCDs Start: 10/22/21 1429   Lab Results  Component Value Date   PLT 267 12/03/2021      Code Status: Full Code  Family Communication: no family at bedside   Status is: Inpatient  Remains inpatient appropriate because: severity of illness  Level of care: Med-Surg  Consultants:  General surgery ID PCCM  Procedures:  none  Objective: Vitals:   12/02/21 0651 12/02/21 1447 12/02/21 1938 12/03/21 0451  BP: (!) 149/83 (!) 158/95 (!) 148/73 138/77  Pulse: 82 89 90 88  Resp:  '18 18 16  '$ Temp: 98.4 F (36.9 C) 98.5 F (36.9 C) 98.4 F (36.9 C) 100.2 F (37.9 C)  TempSrc: Oral Oral Oral Oral  SpO2: 100% 100% 99% 99%  Weight:      Height:        Intake/Output Summary (Last 24 hours) at 12/03/2021 9326 Last data filed at 12/03/2021 0600 Gross per 24 hour  Intake 1453.32 ml   Output 3900 ml  Net -2446.68 ml    Wt Readings from Last 3 Encounters:  11/28/21 65 kg  12/11/17 58.6 kg    Examination: Constitutional: NAD Eyes: lids and conjunctivae normal, no scleral icterus ENMT: mmm Neck: normal, supple Respiratory: clear to auscultation bilaterally, no wheezing, no crackles.  Cardiovascular: Regular rate and rhythm, no murmurs / rubs / gallops.  Abdomen: soft, no distention, no tenderness. Bowel sounds positive.  Skin: no rashes Neurologic: no focal deficits, equal strength  Data Reviewed: I have independently reviewed following labs and imaging studies   CBC Recent Labs  Lab 11/27/21 0504 11/28/21 0504 11/30/21 0500 12/01/21 0500 12/02/21 0222 12/03/21 0414  WBC 6.9 6.2 7.9 7.5 7.9 6.8  HGB 7.9* 8.0* 8.3* 7.9* 7.6* 7.7*  HCT 23.3* 24.2* 25.7* 23.6* 23.4* 22.8*  PLT 186 195 248 246 266 267  MCV 88.6 88.6 90.5 89.7 90.3 88.7  MCH 30.0 29.3 29.2 30.0 29.3 30.0  MCHC 33.9 33.1 32.3 33.5 32.5 33.8  RDW 17.0* 16.9* 17.2* 17.4* 17.4* 17.7*  LYMPHSABS 1.8 2.0  --   --   --   --   MONOABS 0.8 0.7  --   --   --   --   EOSABS 0.3 0.2  --   --   --   --   BASOSABS 0.0 0.0  --   --   --   --      Recent Labs  Lab 11/27/21 0504 11/28/21 0504 11/29/21 0339 11/30/21 0500 12/01/21 0500 12/02/21 0222 12/03/21 0414  NA 131* 131* 131* 129* 127* 127* 125*  K 4.1 4.3 4.6 4.5 4.9 4.6 4.2  CL 100 103 107 105 103 105 102  CO2 24 21* 19* 16* 17* 17* 17*  GLUCOSE 115* 111* 113* 101* 107* 97 101*  BUN 38* 36* 35* 31* 34* 30* 25*  CREATININE 0.81 0.87 0.87 0.73 0.95 0.80 0.72  CALCIUM 8.1* 8.2* 7.9* 8.2* 8.0* 8.0* 8.0*  AST 23 21  --  19 20  --   --   ALT 30 31  --  28 29  --   --   ALKPHOS 130* 129*  --  121 104  --   --   BILITOT 0.7 0.4  --  0.4 0.5  --   --   ALBUMIN 1.9* 1.9*  --  2.1* 2.1*  --   --   MG 1.9 1.7 1.9 1.6* 2.2 1.8  --       ------------------------------------------------------------------------------------------------------------------ Recent Labs    12/01/21 0500  TRIG 77     No results found for: HGBA1C ------------------------------------------------------------------------------------------------------------------ No results for input(s): TSH, T4TOTAL, T3FREE, THYROIDAB in the last 72 hours.  Invalid input(s): FREET3  Cardiac Enzymes No results for input(s): CKMB, TROPONINI, MYOGLOBIN in the last 168 hours.  Invalid input(s): CK ------------------------------------------------------------------------------------------------------------------    Component Value Date/Time   BNP 173.1 (H) 11/07/2021 1405    CBG: Recent Labs  Lab 11/30/21 1205 11/30/21 1712 11/30/21 2332 12/01/21 0618 12/01/21 1237  GLUCAP 106* 129* 114* 107* 106*     Recent Results (from the past 240 hour(s))  Culture, blood (Routine X 2) w Reflex to ID Panel     Status: None   Collection Time: 11/23/21 10:51 AM   Specimen: BLOOD  Result Value Ref Range Status   Specimen Description   Final    BLOOD RIGHT ANTECUBITAL Performed at [facility], [place] [address] [place], [place] [address]    Special Requests   Final    IN PEDIATRIC BOTTLE Blood Culture adequate volume Performed at [place] [address], [place], [place] [address]    Culture   Final    NO GROWTH 5 DAYS Performed at [facility], [place] [address] [place], [place] [address]    Report Status 11/28/2021 FINAL  Final  Culture, blood (Routine X 2) w Reflex to ID Panel     Status: None   Collection Time: 11/23/21 11:48 AM   Specimen: BLOOD  Result Value Ref Range Status   Specimen Description   Final    BLOOD BLOOD RIGHT FOREARM Performed at [place] [address], [place], [place] [address]    Special Requests   Final    BOTTLES DRAWN AEROBIC ONLY Blood Culture  adequate volume Performed at [place] [address], [place], [place] [address]    Culture   Final    NO GROWTH 5 DAYS Performed at [facility], [place] [address] [place], [place] [address]    Report Status 11/28/2021 FINAL  Final  Culture, blood (Routine X 2) w Reflex to ID Panel     Status: None (Preliminary result)   Collection Time: 11/29/21  4:14 PM   Specimen: BLOOD  Result Value Ref Range Status   Specimen Description   Final    BLOOD RIGHT ANTECUBITAL Performed at [place] [address] [place], [place] [address]    Special Requests   Final    CORRECTED RESULTS AEROBIC BOTTLE ONLY PREVIOUSLY REPORTED AS: AEROCOCCUS SPECIES CORRECTED RESULTS CALLED TO: PHARMD ELIZABETH [id number] FCP   Culture   Final    NO GROWTH 4 DAYS Performed at [facility], [place] [address] [place], [place] [address]    Report Status PENDING  Incomplete  Culture, blood (Routine X 2) w Reflex to ID Panel     Status: None (Preliminary result)   Collection Time: 11/29/21  4:19 PM   Specimen: BLOOD  Result Value Ref Range Status   Specimen Description   Final    BLOOD RIGHT ANTECUBITAL LOWER Performed at [place] [address] [place], [place] [address]    Special Requests   Final    BOTTLES DRAWN AEROBIC ONLY Blood Culture adequate volume Performed at [place] [address] [place], [place] [address]    Culture   Final    NO GROWTH 4 DAYS Performed at [facility], [place] [address], [place],  [address]    Report Status PENDING  Incomplete  Urine Culture     Status: Abnormal   Collection Time: 11/29/21  6:35 PM   Specimen: Urine, Clean Catch  Result Value Ref Range Status   Specimen  Description   Final    URINE, CLEAN CATCH Performed at [facility], [place] [address], [place], [place] [address]    Special Requests   Final    NONE Performed at [facility], [place] [address] [place], [place] [address]    Culture (A)  Final    <10,000 COLONIES/mL INSIGNIFICANT GROWTH Performed at [place] [address], [place], [place] [address]    Report Status 11/30/2021 FINAL  Final      Radiology Studies: No results found.   Marzetta Board, MD, PhD [facility]  Between 7 am - 7 pm I am available, please contact me via Amion (for emergencies) or Securechat (non urgent messages)  Between 7 pm - 7 am I am not available, please contact night coverage MD/APP via Amion

## 2021-12-04 LAB — CULTURE, BLOOD (ROUTINE X 2)
Culture: NO GROWTH
Culture: NO GROWTH
Special Requests: ADEQUATE

## 2021-12-04 LAB — BASIC METABOLIC PANEL
Anion gap: 8 (ref 5–15)
BUN: 22 mg/dL — ABNORMAL HIGH (ref 6–20)
CO2: 16 mmol/L — ABNORMAL LOW (ref 22–32)
Calcium: 7.9 mg/dL — ABNORMAL LOW (ref 8.9–10.3)
Chloride: 101 mmol/L (ref 98–111)
Creatinine, Ser: 0.81 mg/dL (ref 0.44–1.00)
GFR, Estimated: >60 mL/min (ref >60)
Glucose, Bld: 103 mg/dL — ABNORMAL HIGH (ref 70–99)
Potassium: 3.9 mmol/L (ref 3.5–5.1)
Sodium: 125 mmol/L — ABNORMAL LOW (ref 135–145)

## 2021-12-04 LAB — CBC
HCT: 24.2 % — ABNORMAL LOW (ref 36.0–46.0)
Hemoglobin: 8 g/dL — ABNORMAL LOW (ref 12.0–15.0)
MCH: 29.7 pg (ref 26.0–34.0)
MCHC: 33.1 g/dL (ref 30.0–36.0)
MCV: 90 fL (ref 80.0–100.0)
Platelets: 294 10*3/uL (ref 150–400)
RBC: 2.69 MIL/uL — ABNORMAL LOW (ref 3.87–5.11)
RDW: 18 % — ABNORMAL HIGH (ref 11.5–15.5)
WBC: 6.8 10*3/uL (ref 4.0–10.5)
nRBC: 0 % (ref 0.0–0.2)

## 2021-12-04 MED ORDER — LORAZEPAM 2 MG/ML IJ SOLN
0.5000 mg | Freq: Three times a day (TID) | INTRAMUSCULAR | Status: DC | PRN
  Administered 2021-12-04 – 2021-12-05 (×2): 0.5 mg via INTRAVENOUS
  Filled 2021-12-04 (×2): qty 1

## 2021-12-04 MED ORDER — OXYCODONE HCL 5 MG/5ML PO SOLN
5.0000 mg | ORAL | Status: DC | PRN
  Administered 2021-12-04 – 2021-12-06 (×9): 10 mg via ORAL
  Filled 2021-12-04 (×9): qty 10

## 2021-12-04 NOTE — Progress Notes (Signed)
43 Days Post-Op   Subjective/Chief Complaint: No new issues Resting comfortably   Objective: Vital signs in last 24 hours: Temp:  [98.5 F (36.9 C)-99.1 F (37.3 C)] 99.1 F (37.3 C) (05/28 0600) Pulse Rate:  [83-87] 87 (05/28 0600) Resp:  [16-18] 18 (05/28 0600) BP: (133-146)/(75-92) 138/92 (05/28 0600) SpO2:  [98 %-100 %] 98 % (05/28 0600) Last BM Date : 12/03/21  Intake/Output from previous day: 05/27 0701 - 05/28 0700 In: 1720.9 [P.O.:1440; NG/GT:120; IV Piggyback:160.9] Out: 2355 [Urine:1400; Drains:305; Stool:650] Intake/Output this shift: No intake/output data recorded.  General appearance: alert and cooperative GI: soft, not really tender. Ostomy with feculent output, 650cc output yesterday.  Eakin's pouch with minimal output in bag.    Lab Results:  Recent Labs    12/03/21 0414 12/04/21 0325  WBC 6.8 6.8  HGB 7.7* 8.0*  HCT 22.8* 24.2*  PLT 267 294   BMET Recent Labs    12/03/21 0414 12/04/21 0325  NA 125* 125*  K 4.2 3.9  CL 102 101  CO2 17* 16*  GLUCOSE 101* 103*  BUN 25* 22*  CREATININE 0.72 0.81  CALCIUM 8.0* 7.9*   PT/INR No results for input(s): LABPROT, INR in the last 72 hours. ABG No results for input(s): PHART, HCO3 in the last 72 hours.  Invalid input(s): PCO2, PO2  Studies/Results: No results found.  Anti-infectives: Anti-infectives (From admission, onward)    Start     Dose/Rate Route Frequency Ordered Stop   11/30/21 0600  vancomycin (VANCOREADY) IVPB 750 mg/150 mL  Status:  Discontinued        750 mg 150 mL/hr over 60 Minutes Intravenous Every 12 hours 11/29/21 1640 12/01/21 1021   11/29/21 1700  vancomycin (VANCOREADY) IVPB 1250 mg/250 mL        1,250 mg 166.7 mL/hr over 90 Minutes Intravenous  Once 11/29/21 1605 11/29/21 1841   11/14/21 0800  piperacillin-tazobactam (ZOSYN) IVPB 3.375 g        3.375 g 12.5 mL/hr over 240 Minutes Intravenous Every 8 hours 11/14/21 0739     11/04/21 1000  piperacillin-tazobactam  (ZOSYN) IVPB 3.375 g        3.375 g 12.5 mL/hr over 240 Minutes Intravenous Every 8 hours 11/04/21 0935 11/11/21 1312   10/27/21 1600  cefTRIAXone (ROCEPHIN) 2 g in sodium chloride 0.9 % 100 mL IVPB  Status:  Discontinued        2 g 200 mL/hr over 30 Minutes Intravenous Every 24 hours 10/27/21 0843 10/27/21 1106   10/27/21 1600  piperacillin-tazobactam (ZOSYN) IVPB 3.375 g        3.375 g 12.5 mL/hr over 240 Minutes Intravenous Every 8 hours 10/27/21 1106 11/03/21 2000   10/27/21 1400  metroNIDAZOLE (FLAGYL) IVPB 500 mg  Status:  Discontinued        500 mg 100 mL/hr over 60 Minutes Intravenous Every 12 hours 10/27/21 0843 10/27/21 1106   10/24/21 1600  piperacillin-tazobactam (ZOSYN) IVPB 3.375 g  Status:  Discontinued        3.375 g 12.5 mL/hr over 240 Minutes Intravenous Every 8 hours 10/24/21 1422 10/27/21 0843   10/24/21 1000  cefTRIAXone (ROCEPHIN) 2 g in sodium chloride 0.9 % 100 mL IVPB  Status:  Discontinued        2 g 200 mL/hr over 30 Minutes Intravenous Every 24 hours 10/24/21 0747 10/24/21 1357   10/23/21 1530  vancomycin (VANCOCIN) IVPB 1000 mg/200 mL premix        1,000 mg 200 mL/hr over  60 Minutes Intravenous  Once 10/23/21 1439 10/23/21 1547   10/23/21 0200  ceFEPIme (MAXIPIME) 2 g in sodium chloride 0.9 % 100 mL IVPB  Status:  Discontinued        2 g 200 mL/hr over 30 Minutes Intravenous Every 12 hours 10/22/21 2032 10/24/21 0747   10/22/21 2200  cefOXitin (MEFOXIN) 2 g in sodium chloride 0.9 % 100 mL IVPB  Status:  Discontinued        2 g 200 mL/hr over 30 Minutes Intravenous Every 8 hours 10/22/21 1706 10/22/21 2032   10/22/21 1956  vancomycin variable dose per unstable renal function (pharmacist dosing)  Status:  Discontinued         Does not apply See admin instructions 10/22/21 1957 10/23/21 1917   10/22/21 1245  vancomycin (VANCOCIN) IVPB 1000 mg/200 mL premix        1,000 mg 200 mL/hr over 60 Minutes Intravenous  Once 10/22/21 1241 10/22/21 1402   10/22/21 1245   ceFEPIme (MAXIPIME) 2 g in sodium chloride 0.9 % 100 mL IVPB        2 g 200 mL/hr over 30 Minutes Intravenous  Once 10/22/21 1241 10/22/21 1321   10/22/21 1230  metroNIDAZOLE (FLAGYL) IVPB 500 mg  Status:  Discontinued        500 mg 100 mL/hr over 60 Minutes Intravenous Every 12 hours 10/22/21 1222 10/24/21 1357       Assessment/Plan: POD#43 - PERFORATED SIGMOID COLON - status post EX LAP, HARTMAN'S PROCEDURE, Gastrostomy tube placement - 3/35/4562 Dr. 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 WOCN following for colostomy and pouching of fistula - CT 5/4 and 5/11 with pelvic fluid collection measuring 6 x 3.2 cm. Discussed with IR and not felt amenable/safe for drainage. -  G-tube clamped 5/7. Patient tolerating. Tolerating ice chips/sips. SLP following  - CT A/P repeated 5/17 due to low grade fever and tachycardia - shows interval decrease in intra abdominal fluid collections. Pleural effusion. Generalized body wall edema.  -CT A/P 5/23 stable to improved diffusely.     -Better PO intake.  Will drink Ensure as able between meals.   -DC TNA 5/25 -IR drain removed on 5/24 -eakin's pouch output minimal -ostomy around 550cc yesterday -d/w primary and ID about starting to plan for DC dispo which is likely home early next week -HH PT/OT/RN orders written    FEN - G-tube capped, diet SOFT VTE - SCDs, SQH ID - zosyn 4/17 - 4/27; 4/28-5/6, 5/8 >> (4-6 weeks per ID)    LOS: 43 days    Maia Petties 12/04/2021

## 2021-12-04 NOTE — Progress Notes (Signed)
OT Cancellation Note  Patient Details Name: Jessica [name] MRN: [id number] DOB: March 10, 1961   Cancelled Treatment:    Reason Eval/Treat Not Completed: Medical issues which prohibited therapy Patient declined to move at this time reporting that she is comfortable and had a bad night. OT to continue to follow and check back on 5/29 [name] OTR/L, [place] [facility] Office# [phone] Pager# [phone]  12/04/2021, 1:38 PM

## 2021-12-04 NOTE — Progress Notes (Addendum)
PROGRESS NOTE  Jessica [name] BHA:[id number] DOB: 12-24-1960 DOA: 10/22/2021 PCP: Deland Pretty, MD   LOS: 43 days   Brief Narrative / Interim history: 61 year old female with history of alcohol abuse, several alcohol withdrawals requiring admissions, tobacco use presented on 10/22/2021 with diffuse abdominal pain with CT of abdomen and pelvis showed pneumoperitoneum and free fluid.  She was started on broad-spectrum antibiotics and emergently taken to the OR and underwent exploratory laparotomy where she was found to have a perforated sigmoid colon.  She is status post Hartmann procedure and insertion of G-tube.  She was found to have Klebsiella bacteremia.  Hospital course complicated by development of left lower quadrant fluid collection status post IR drainage on 10/29/2021, drain cultures grew Pseudomonas. This required having her drain adjusted and upsized on 510, fluid appeared feculent.  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 She had a repeat CT on 11/10/2021 with pelvic fluid collection measuring 6 x 3.2 cm.  This was discussed with IR and it was not felt safe or amenable to drainage.  ID was consulted and she was continued on IV antibiotics. Repeat CT scan showed stable and improving pelvic abscess.  Currently plans are to continue IV Zosyn for at least the next 4 weeks with a repeat CT scan in the next 2 to 3 weeks.  Disposition pending home health set up as well as sodium stabilization  Subjective / 24h Interval events: No complaints other than that she has been awakened throughout the night.  Assesement and Plan: Principal Problem:   Colocutaneous fistula Active Problems:   Bowel perforation (HCC)   Acute respiratory failure with hypoxia (HCC)   Acute metabolic encephalopathy   Oropharyngeal dysphagia   Chronic pain   Tobacco use disorder   Protein-calorie malnutrition, severe (HCC)   AKI (acute kidney injury) (HCC)    Hypernatremia, hyponatremia, hypokalemia, hyperphosphatemia   Elevated brain natriuretic peptide (BNP) level   Right proximal humeral fracture   Normocytic anemia   Substance induced mood disorder (HCC)   Anxiety   Primary insomnia   Underweight   Colostomy in place [facility])   Stricture and stenosis of esophagus   Gastrostomy tube in place [facility])   Hyponatremia   Palliative care by specialist   Goals of care, counseling/discussion   General weakness   Sepsis ([place])   Postoperative intra-abdominal abscess   Generalized abdominal pain   Aspiration pneumonia ([place])   Bacteremia due to Klebsiella pneumoniae   Acute encephalopathy   Humerus fracture   Pelvic abscess - pseudomonas - s/p percutaneous drainage 10/29/2021   Intra-abdominal abscess ([place])   Abscess  Principal problem Complicated intra-abdominal infection, Pseudomonas intra-abdominal abscess infection, recurrent fever-Perforated sigmoid colon status post expiratory laparotomy/Hartman's procedure/G-tube placement.  She also has a colostomy.  There was concern about high output in her fistula pouch, was placed on Imodium, FiberCon, with improved output.  She was initially on TPN but now has been weaned off as she is gradually eating better and better.  Colostomy is working well.  ID consulted due to her Pseudomonas intra-abdominal abscess infections, and currently she is on Zosyn.  Repeat CT scan on 5/24 shows improvement.  Overall currently she is getting better, tolerating p.o. intake, and ID recommends Zosyn IV 13.5 g continuous infusion every 24 hours with end date 12/30/2021.  Now working on arranging home discharge next week  Active problems Septic shock due to Klebsiella bacteremia -present on admission, initially requiring ICU,  now shock physiology has resolved.  As above, currently is on vancomycin and Zosyn. Repeat blood cultures from 11/23/2021 have remained negative.  Had another fever 5/23, repeat blood cultures have remained  negative.  Acute respiratory failure with hypoxia/aspiration pneumonia-Has required intubation and extubation twice.  Extubated again on 11/08/2021.  She is on room air currently   Acute metabolic encephalopathy -Multifactorial due to ICU delirium/infectious etiology/alcohol withdrawal.  Resolved.  Back to baseline   AKI -Resolved.   Essential hypertension -on metoprolol, blood pressure acceptable   Anemia of chronic disease -Has required 4 units packed red cells transfusion during the hospitalization with last transfusion being on 11/24/2021 for hemoglobin of 6.9.  Hemoglobin values variable, now improving   Hyponatremia -trending down over the last few days, 125 on 5/27. Patient tells me she is drinking large amounts of water due to consistent thirst.  She was on salt tabs, now discontinued.  I believe her hyponatremia is more related to her free water intake, continue fluid restriction, sodium stable today at 125.  Monitor again tomorrow  Severe protein calorie malnutrition/hypoalbuminemia, anorexia -Due to poor oral intake.  Nutrition following  Hypomagnesemia -Resolved   Substance-induced mood disorder/anxiety -Continue lorazepam as needed.   Physical deconditioning -seems to doing better, PT recommends home health PT.  Social worker working to obtain PT as she is uninsured   Scheduled Meds:  vitamin C  500 mg Oral BID   chlorhexidine  15 mL Mouth Rinse BID   Chlorhexidine Gluconate Cloth  6 each Topical Daily   enoxaparin (LOVENOX) injection  40 mg Subcutaneous Q24H   feeding supplement  237 mL Per Tube BID BM   ferrous sulfate  325 mg Oral BID WC   gabapentin  300 mg Oral QHS   lip balm  1 application. Topical BID   loperamide  4 mg Oral QHS   mouth rinse  15 mL Mouth Rinse q12n4p   metoprolol tartrate  50 mg Oral BID   nicotine  14 mg Transdermal Daily   pantoprazole  40 mg Oral Daily   polycarbophil  625 mg Oral BID   sodium chloride flush  10-40 mL Intracatheter Q12H    vitamin A  10,000 Units Oral Daily   Continuous Infusions:  sodium chloride 250 mL (12/01/21 0002)   methocarbamol (ROBAXIN) IV     piperacillin-tazobactam (ZOSYN)  IV 3.375 g (12/04/21 0918)   PRN Meds:.acetaminophen, alum & mag hydroxide-simeth, antiseptic oral rinse, diphenhydrAMINE, diphenhydrAMINE, enalaprilat, fentaNYL (SUBLIMAZE) injection, hydrALAZINE, ipratropium-albuterol, lip balm, LORazepam, magic mouthwash, menthol-cetylpyridinium, methocarbamol (ROBAXIN) IV, methocarbamol, metoprolol tartrate, ondansetron (ZOFRAN) IV, oxyCODONE, phenol, prochlorperazine, simethicone, sodium chloride  Diet Orders (From admission, onward)     Start     Ordered   12/03/21 0925  Diet regular Room service appropriate? Yes; Fluid consistency: Thin; Fluid restriction: 1800 mL Fluid  Diet effective now       Question Answer Comment  Room service appropriate? Yes   Fluid consistency: Thin   Fluid restriction: 1800 mL Fluid      12/03/21 0924            DVT prophylaxis: enoxaparin (LOVENOX) injection 40 mg Start: 11/27/21 1400 Place and maintain sequential compression device Start: 10/25/21 0954 SCDs Start: 10/22/21 1429   Lab Results  Component Value Date   PLT 294 12/04/2021      Code Status: Full Code  Family Communication: no family at bedside   Status is: Inpatient  Remains inpatient appropriate because: severity of illness  Level  of care: Med-Surg  Consultants:  General surgery ID [id number]  Procedures:  none  Objective: Vitals:   12/03/21 0451 12/03/21 1310 12/03/21 2143 12/04/21 0600  BP: 138/77 (!) 146/88 133/75 (!) 138/92  Pulse: 88 84 83 87  Resp: '16 18 16 18  '$ Temp: 100.2 F (37.9 C) 98.5 F (36.9 C) 98.5 F (36.9 C) 99.1 F (37.3 C)  TempSrc: Oral Oral Oral Oral  SpO2: 99% 100% 100% 98%  Weight:      Height:        Intake/Output Summary (Last 24 hours) at 12/04/2021 0931 Last data filed at 12/04/2021 0600 Gross per 24 hour  Intake 1720.92 ml  Output  2355 ml  Net -634.08 ml    Wt Readings from Last 3 Encounters:  11/28/21 65 kg  12/11/17 58.6 kg    Examination: Constitutional: NAD Eyes: lids and conjunctivae normal, no scleral icterus ENMT: mmm Neck: normal, supple Respiratory: clear to auscultation bilaterally, no wheezing, no crackles. Normal respiratory effort.  Cardiovascular: Regular rate and rhythm, no murmurs / rubs / gallops. No LE edema. Abdomen: soft, no distention, no tenderness. Bowel sounds positive.  Skin: no rashes Neurologic: no focal deficits, equal strength  Data Reviewed: I have independently reviewed following labs and imaging studies   CBC Recent Labs  Lab 11/28/21 0504 11/30/21 0500 12/01/21 0500 12/02/21 0222 12/03/21 0414 12/04/21 0325  WBC 6.2 7.9 7.5 7.9 6.8 6.8  HGB 8.0* 8.3* 7.9* 7.6* 7.7* 8.0*  HCT 24.2* 25.7* 23.6* 23.4* 22.8* 24.2*  PLT 195 248 246 266 267 294  MCV 88.6 90.5 89.7 90.3 88.7 90.0  MCH 29.3 29.2 30.0 29.3 30.0 29.7  MCHC 33.1 32.3 33.5 32.5 33.8 33.1  RDW 16.9* 17.2* 17.4* 17.4* 17.7* 18.0*  LYMPHSABS 2.0  --   --   --   --   --   MONOABS 0.7  --   --   --   --   --   EOSABS 0.2  --   --   --   --   --   BASOSABS 0.0  --   --   --   --   --      Recent Labs  Lab 11/28/21 0504 11/29/21 0339 11/30/21 0500 12/01/21 0500 12/02/21 0222 12/03/21 0414 12/04/21 0325  NA 131* 131* 129* 127* 127* 125* 125*  K 4.3 4.6 4.5 4.9 4.6 4.2 3.9  CL 103 107 105 103 105 102 101  CO2 21* 19* 16* 17* 17* 17* 16*  GLUCOSE 111* 113* 101* 107* 97 101* 103*  BUN 36* 35* 31* 34* 30* 25* 22*  CREATININE 0.87 0.87 0.73 0.95 0.80 0.72 0.81  CALCIUM 8.2* 7.9* 8.2* 8.0* 8.0* 8.0* 7.9*  AST 21  --  19 20  --   --   --   ALT 31  --  28 29  --   --   --   ALKPHOS 129*  --  121 104  --   --   --   BILITOT 0.4  --  0.4 0.5  --   --   --   ALBUMIN 1.9*  --  2.1* 2.1*  --   --   --   MG 1.7 1.9 1.6* 2.2 1.8  --   --       ------------------------------------------------------------------------------------------------------------------ No results for input(s): CHOL, HDL, LDLCALC, TRIG, CHOLHDL, LDLDIRECT in the last 72 hours.   No results found for: HGBA1C ------------------------------------------------------------------------------------------------------------------ No results for input(s): TSH, T4TOTAL, T3FREE,  THYROIDAB in the last 72 hours.  Invalid input(s): FREET3  Cardiac Enzymes No results for input(s): CKMB, TROPONINI, MYOGLOBIN in the last 168 hours.  Invalid input(s): CK ------------------------------------------------------------------------------------------------------------------    Component Value Date/Time   BNP 173.1 (H) 11/07/2021 1405    CBG: Recent Labs  Lab 11/30/21 1205 11/30/21 1712 11/30/21 2332 12/01/21 0618 12/01/21 1237  GLUCAP 106* 129* 114* 107* 106*     Recent Results (from the past 240 hour(s))  Culture, blood (Routine X 2) w Reflex to ID Panel     Status: None (Preliminary result)   Collection Time: 11/29/21  4:14 PM   Specimen: BLOOD  Result Value Ref Range Status   Specimen Description   Final    BLOOD RIGHT ANTECUBITAL Performed at [facility] [address], [place], [place] [address]    Special Requests   Final    CORRECTED RESULTS AEROBIC BOTTLE ONLY PREVIOUSLY REPORTED AS: AEROCOCCUS SPECIES CORRECTED RESULTS CALLED TO: PHARMD ELIZABETH [id number] FCP   Culture   Final    NO GROWTH 4 DAYS Performed at [facility], [place] [address] [place], [place] [address]    Report Status PENDING  Incomplete  Culture, blood (Routine X 2) w Reflex to ID Panel     Status: None (Preliminary result)   Collection Time: 11/29/21  4:19 PM   Specimen: BLOOD  Result Value Ref Range Status   Specimen Description   Final    BLOOD RIGHT ANTECUBITAL LOWER Performed at [facility] [address],  [place], [place] [address]    Special Requests   Final    BOTTLES DRAWN AEROBIC ONLY Blood Culture adequate volume Performed at [facility] [address] [place], [place] [address]    Culture   Final    NO GROWTH 4 DAYS Performed at [facility], [place] [address] [place], [place] [address]    Report Status PENDING  Incomplete  Urine Culture     Status: Abnormal   Collection Time: 11/29/21  6:35 PM   Specimen: Urine, Clean Catch  Result Value Ref Range Status   Specimen Description   Final    URINE, CLEAN CATCH Performed at [facility], [place] [address] [place],  [address]    Special Requests   Final    NONE Performed at [facility], [place] [address] [place],  [address]    Culture (A)  Final    <10,000 COLONIES/mL INSIGNIFICANT GROWTH Performed at [facility] [address], [place],  [address]    Report Status 11/30/2021 FINAL  Final      Radiology Studies: No results found.   Marzetta Board, MD, PhD [facility]  Between 7 am - 7 pm I am available, please contact me via Amion (for emergencies) or Securechat (non urgent messages)  Between 7 pm - 7 am I am not available, please contact night coverage MD/APP via Amion

## 2021-12-04 NOTE — Plan of Care (Signed)
  Problem: Clinical Measurements: Goal: Respiratory complications will improve Outcome: Progressing   Problem: Clinical Measurements: Goal: Cardiovascular complication will be avoided Outcome: Progressing   Problem: Coping: Goal: Level of anxiety will decrease Outcome: Progressing   Problem: Elimination: Goal: Will not experience complications related to bowel motility Outcome: Progressing   Problem: Skin Integrity: Goal: Risk for impaired skin integrity will decrease Outcome: Progressing   Problem: Respiratory: Goal: Ability to maintain adequate ventilation will improve Outcome: Progressing   Problem: Safety: Goal: Non-violent Restraint(s) Outcome: Progressing

## 2021-12-05 LAB — BASIC METABOLIC PANEL
Anion gap: 6 (ref 5–15)
BUN: 19 mg/dL (ref 6–20)
CO2: 18 mmol/L — ABNORMAL LOW (ref 22–32)
Calcium: 8 mg/dL — ABNORMAL LOW (ref 8.9–10.3)
Chloride: 103 mmol/L (ref 98–111)
Creatinine, Ser: 0.74 mg/dL (ref 0.44–1.00)
GFR, Estimated: >60 mL/min (ref >60)
Glucose, Bld: 104 mg/dL — ABNORMAL HIGH (ref 70–99)
Potassium: 4 mmol/L (ref 3.5–5.1)
Sodium: 127 mmol/L — ABNORMAL LOW (ref 135–145)

## 2021-12-05 MED ORDER — COVID-19MRNA BIVAL VACC PFIZER 30 MCG/0.3ML IM SUSP
0.3000 mL | Freq: Once | INTRAMUSCULAR | Status: DC
  Filled 2021-12-05: qty 0.3

## 2021-12-05 NOTE — Progress Notes (Signed)
OT Cancellation Note  Patient Details Name: Jessica [name] MRN: [id number] DOB: Jan 28, 1961   Cancelled Treatment:    Reason Eval/Treat Not Completed: Patient declined, no reason specified Patient declined reporting she needed to nap prior to meeting with case manager. OT to continue to follow and check back as schedule will allow. [name] OTR/L, [place] [facility] Office# [phone] Pager# [phone]  12/05/2021, 1:06 PM

## 2021-12-05 NOTE — Progress Notes (Signed)
PROGRESS NOTE  Jessica [name] WCH:[id number] DOB: 1960/08/14 DOA: 10/22/2021 PCP: Deland Pretty, MD   LOS: 44 days   Brief Narrative / Interim history: 61 year old female with history of alcohol abuse, several alcohol withdrawals requiring admissions, tobacco use presented on 10/22/2021 with diffuse abdominal pain with CT of abdomen and pelvis showed pneumoperitoneum and free fluid.  She was started on broad-spectrum antibiotics and emergently taken to the OR and underwent exploratory laparotomy where she was found to have a perforated sigmoid colon.  She is status post Hartmann procedure and insertion of G-tube.  She was found to have Klebsiella bacteremia.  Hospital course complicated by development of left lower quadrant fluid collection status post IR drainage on 10/29/2021, drain cultures grew Pseudomonas. This required having her drain adjusted and upsized on 510, fluid appeared feculent.  Drain was eventually removed last week.  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 She had a repeat CT on 11/10/2021 with pelvic fluid collection measuring 6 x 3.2 cm.  This was discussed with IR and it was not felt safe or amenable to drainage.  ID was consulted and she was continued on IV antibiotics. Repeat CT scan showed stable and improving pelvic abscess.  She was briefly on TPN but now has been weaned off and she is eating.  Currently plans are to continue IV Zosyn for at least the next 4 weeks with a repeat CT scan in the next 2 to 3 weeks.  Disposition pending home health set up as well as sodium stabilization  Subjective / 24h Interval events: Discussed about imminent discharge, she started crying, became very anxious at the prospect of being home, she seems overwhelmed about what she needs to do and how she will manage.  She has a friend who will help with her abdominal pouches but she knows nothing about what needs to be done.  Otherwise she  denies any abdominal pain, nausea or vomiting.  Tolerating a regular diet without difficulties.  Assesement and Plan: Principal Problem:   Colocutaneous fistula Active Problems:   Bowel perforation (HCC)   Acute respiratory failure with hypoxia (HCC)   Acute metabolic encephalopathy   Oropharyngeal dysphagia   Chronic pain   Tobacco use disorder   Protein-calorie malnutrition, severe (HCC)   AKI (acute kidney injury) (HCC)   Hypernatremia, hyponatremia, hypokalemia, hyperphosphatemia   Elevated brain natriuretic peptide (BNP) level   Right proximal humeral fracture   Normocytic anemia   Substance induced mood disorder (HCC)   Anxiety   Primary insomnia   Underweight   Colostomy in place [facility])   Stricture and stenosis of esophagus   Gastrostomy tube in place [facility])   Hyponatremia   Palliative care by specialist   Goals of care, counseling/discussion   General weakness   Sepsis ([place])   Postoperative intra-abdominal abscess   Generalized abdominal pain   Aspiration pneumonia ([place])   Bacteremia due to Klebsiella pneumoniae   Acute encephalopathy   Humerus fracture   Pelvic abscess - pseudomonas - s/p percutaneous drainage 10/29/2021   Intra-abdominal abscess ([place])   Abscess  Principal problem Complicated intra-abdominal infection, Pseudomonas intra-abdominal abscess infection, recurrent fever-Perforated sigmoid colon status post expiratory laparotomy/Hartman's procedure/G-tube placement.  She also has a colostomy.  There was concern about high output in her fistula pouch, was placed on Imodium, FiberCon, with improved output.  She was initially on TPN but now has been weaned off as she is gradually eating better and  better.  Colostomy is working well.  ID consulted due to her Pseudomonas intra-abdominal abscess infections, and currently she is on Zosyn.  Repeat CT scan on 5/24 shows improvement.  Overall currently she is getting better, tolerating p.o. intake, and ID recommends  Zosyn IV 13.5 g continuous infusion every 24 hours with end date 12/30/2021.  TOC working on home health for home  Active problems Septic shock due to Klebsiella bacteremia -present on admission, initially requiring ICU, now shock physiology has resolved.  As above, currently is on  Zosyn. Repeat blood cultures from 11/23/2021 have remained negative.  Had another fever 5/23, and was briefly in stepdown due to associated tachycardia, repeat blood cultures have remained negative.  She has now remained stable on the floor  Acute respiratory failure with hypoxia/aspiration pneumonia-Has required intubation and extubation twice.  Extubated again on 11/08/2021.  Remains on room air   Acute metabolic encephalopathy -Multifactorial due to ICU delirium/infectious etiology/alcohol withdrawal.  Resolved.  Back to baseline   AKI -Resolved.   Essential hypertension -on metoprolol, blood pressure acceptable   Anemia of chronic disease -Has required 4 units packed red cells transfusion during the hospitalization with last transfusion being on 11/24/2021 for hemoglobin of 6.9.  Hemoglobin values variable, stable   Hyponatremia -trending down over the last few days, as low as 125 on 5/27. Patient told me she was drinking large amounts of water due to consistent thirst.  She was on salt tabs which she did not like, now discontinued.  I believe her hyponatremia is more related to her free water intake.  She was placed on fluid restriction, sodium now improving to 127 this morning.  Repeat tomorrow morning  Severe protein calorie malnutrition/hypoalbuminemia, anorexia, deconditioning-Due to poor oral intake.  Nutrition following.  Concerned about navigating steps entering her home.  PT to work with her on stair training today  Hypomagnesemia -Resolved   Substance-induced mood disorder/anxiety -Continue lorazepam as needed.   Physical deconditioning -seems to doing better, PT recommends home health PT.  Social worker  working to obtain PT as she is uninsured   Scheduled Meds:  vitamin C  500 mg Oral BID   Chlorhexidine Gluconate Cloth  6 each Topical Daily   enoxaparin (LOVENOX) injection  40 mg Subcutaneous Q24H   feeding supplement  237 mL Per Tube BID BM   ferrous sulfate  325 mg Oral BID WC   gabapentin  300 mg Oral QHS   lip balm  1 application. Topical BID   loperamide  4 mg Oral QHS   mouth rinse  15 mL Mouth Rinse q12n4p   metoprolol tartrate  50 mg Oral BID   nicotine  14 mg Transdermal Daily   pantoprazole  40 mg Oral Daily   polycarbophil  625 mg Oral BID   sodium chloride flush  10-40 mL Intracatheter Q12H   vitamin A  10,000 Units Oral Daily   Continuous Infusions:  sodium chloride 250 mL (12/01/21 0002)   methocarbamol (ROBAXIN) IV     piperacillin-tazobactam (ZOSYN)  IV 3.375 g (12/05/21 0802)   PRN Meds:.acetaminophen, alum & mag hydroxide-simeth, antiseptic oral rinse, diphenhydrAMINE, diphenhydrAMINE, enalaprilat, fentaNYL (SUBLIMAZE) injection, hydrALAZINE, ipratropium-albuterol, lip balm, LORazepam, magic mouthwash, menthol-cetylpyridinium, methocarbamol (ROBAXIN) IV, methocarbamol, metoprolol tartrate, ondansetron (ZOFRAN) IV, oxyCODONE, phenol, prochlorperazine, simethicone, sodium chloride  Diet Orders (From admission, onward)     Start     Ordered   12/03/21 0925  Diet regular Room service appropriate? Yes; Fluid consistency: Thin; Fluid restriction: 1800 mL  Fluid  Diet effective now       Question Answer Comment  Room service appropriate? Yes   Fluid consistency: Thin   Fluid restriction: 1800 mL Fluid      12/03/21 0924            DVT prophylaxis: enoxaparin (LOVENOX) injection 40 mg Start: 11/27/21 1400 Place and maintain sequential compression device Start: 10/25/21 0954 SCDs Start: 10/22/21 1429   Lab Results  Component Value Date   PLT 294 12/04/2021      Code Status: Full Code  Family Communication: no family at bedside   Status is:  Inpatient  Remains inpatient appropriate because: severity of illness  Level of care: Med-Surg  Consultants:  General surgery ID PCCM  Procedures:  none  Objective: Vitals:   12/04/21 0600 12/04/21 1312 12/04/21 2032 12/05/21 0558  BP: (!) 138/92 (!) 152/99 (!) 145/86 (!) 147/84  Pulse: 87 75 89 84  Resp: '18 15 17 17  '$ Temp: 99.1 F (37.3 C) 98.7 F (37.1 C) 97.7 F (36.5 C) 98.5 F (36.9 C)  TempSrc: Oral Oral Oral Oral  SpO2: 98% 98% 100% 100%  Weight:      Height:        Intake/Output Summary (Last 24 hours) at 12/05/2021 0958 Last data filed at 12/05/2021 0957 Gross per 24 hour  Intake 1967.63 ml  Output 5575 ml  Net -3607.37 ml    Wt Readings from Last 3 Encounters:  11/28/21 65 kg  12/11/17 58.6 kg    Examination: Constitutional: NAD Eyes: lids and conjunctivae normal, no scleral icterus ENMT: mmm Neck: normal, supple Respiratory: clear to auscultation bilaterally, no wheezing, no crackles. Normal respiratory effort.  Cardiovascular: Regular rate and rhythm, no murmurs / rubs / gallops. No LE edema. Abdomen: Bowel sounds positive.  Skin: no rashes Neurologic: no focal deficits, equal strength  Data Reviewed: I have independently reviewed following labs and imaging studies   CBC Recent Labs  Lab 11/30/21 0500 12/01/21 0500 12/02/21 0222 12/03/21 0414 12/04/21 0325  WBC 7.9 7.5 7.9 6.8 6.8  HGB 8.3* 7.9* 7.6* 7.7* 8.0*  HCT 25.7* 23.6* 23.4* 22.8* 24.2*  PLT 248 246 266 267 294  MCV 90.5 89.7 90.3 88.7 90.0  MCH 29.2 30.0 29.3 30.0 29.7  MCHC 32.3 33.5 32.5 33.8 33.1  RDW 17.2* 17.4* 17.4* 17.7* 18.0*     Recent Labs  Lab 11/29/21 0339 11/30/21 0500 12/01/21 0500 12/02/21 0222 12/03/21 0414 12/04/21 0325 12/05/21 0313  NA 131* 129* 127* 127* 125* 125* 127*  K 4.6 4.5 4.9 4.6 4.2 3.9 4.0  CL 107 105 103 105 102 101 103  CO2 19* 16* 17* 17* 17* 16* 18*  GLUCOSE 113* 101* 107* 97 101* 103* 104*  BUN 35* 31* 34* 30* 25* 22* 19   CREATININE 0.87 0.73 0.95 0.80 0.72 0.81 0.74  CALCIUM 7.9* 8.2* 8.0* 8.0* 8.0* 7.9* 8.0*  AST  --  19 20  --   --   --   --   ALT  --  28 29  --   --   --   --   ALKPHOS  --  121 104  --   --   --   --   BILITOT  --  0.4 0.5  --   --   --   --   ALBUMIN  --  2.1* 2.1*  --   --   --   --   MG 1.9 1.6* 2.2 1.8  --   --   --      ------------------------------------------------------------------------------------------------------------------  No results for input(s): CHOL, HDL, LDLCALC, TRIG, CHOLHDL, LDLDIRECT in the last 72 hours.   No results found for: HGBA1C ------------------------------------------------------------------------------------------------------------------ No results for input(s): TSH, T4TOTAL, T3FREE, THYROIDAB in the last 72 hours.  Invalid input(s): FREET3  Cardiac Enzymes No results for input(s): CKMB, TROPONINI, MYOGLOBIN in the last 168 hours.  Invalid input(s): CK ------------------------------------------------------------------------------------------------------------------    Component Value Date/Time   BNP 173.1 (H) 11/07/2021 1405    CBG: Recent Labs  Lab 11/30/21 1205 11/30/21 1712 11/30/21 2332 12/01/21 0618 12/01/21 1237  GLUCAP 106* 129* 114* 107* 106*     Recent Results (from the past 240 hour(s))  Culture, blood (Routine X 2) w Reflex to ID Panel     Status: None   Collection Time: 11/29/21  4:14 PM   Specimen: BLOOD  Result Value Ref Range Status   Specimen Description   Final    BLOOD RIGHT ANTECUBITAL Performed at [place] [address], [name], [place] [address]    Special Requests   Final    CORRECTED RESULTS AEROBIC BOTTLE ONLY PREVIOUSLY REPORTED AS: AEROCOCCUS SPECIES CORRECTED RESULTS CALLED TO: PHARMD ELIZABETH [id number] FCP   Culture   Final    NO GROWTH 5 DAYS Performed at [facility], [place] [address], [place], [place] [address]    Report Status 12/04/2021 FINAL  Final   Culture, blood (Routine X 2) w Reflex to ID Panel     Status: None   Collection Time: 11/29/21  4:19 PM   Specimen: BLOOD  Result Value Ref Range Status   Specimen Description   Final    BLOOD RIGHT ANTECUBITAL LOWER Performed at [place] [address] [place], [place] [address]    Special Requests   Final    BOTTLES DRAWN AEROBIC ONLY Blood Culture adequate volume Performed at [place] [address], [place], [place] [address]    Culture   Final    NO GROWTH 5 DAYS Performed at [facility], [place] [address] [place], [place] [address]    Report Status 12/04/2021 FINAL  Final  Urine Culture     Status: Abnormal   Collection Time: 11/29/21  6:35 PM   Specimen: Urine, Clean Catch  Result Value Ref Range Status   Specimen Description   Final    URINE, CLEAN CATCH Performed at [facility], [place] [address], [place], [place] [address]    Special Requests   Final    NONE Performed at [facility], [place] [address], [place], [place] [address]    Culture (A)  Final    <10,000 COLONIES/mL INSIGNIFICANT GROWTH Performed at [place] [address] [place], [place] [address]    Report Status 11/30/2021 FINAL  Final      Radiology Studies: No results found.   Marzetta Board, MD, PhD [facility]  Between 7 am - 7 pm I am available, please contact me via Amion (for emergencies) or Securechat (non urgent messages)  Between 7 pm - 7 am I am not available, please contact night coverage MD/APP via Amion

## 2021-12-05 NOTE — Progress Notes (Addendum)
Physical Therapy Treatment Patient Details Name: Jessica [name] MRN: [id number] DOB: 1960/08/29 Today's Date: 12/05/2021   History of Present Illness Jessica [name] is an 61 y.o. female past medical history significant for alcohol abuse, with admissions for severe alcohol withdrawals requiring ICU admissions tobacco use presented to the ED on 10/22/2021 diffuse abdominal pain that started the day prior to admission.  CT of the abdomen pelvis showed pneumoperitoneum and free fluid, severe sepsis with started on broad-spectrum antibiotics fluid resuscitated surgery was consulted was taken emergently to the OR underwent exploratory laparotomy and was found to have a perforated sigmoid colon, status post Hartmann procedure and insertion of the G-tube which was left to drain to gravity, JP in pelvis was noted to have feculent peritonitis in the pelvis.  ICU course was complicated by Klebsiella bacteremia profound caloric protein malnutrition requiring TPN, acute respiratory failure with hypoxia and tachycardia with failure to progress. patient was transitioned back to ICU on 5/24 with fever.    PT Comments    Pt progressing well with PT. Practiced 3 stairs x3 with RW and min/guard assist. Pt reports incr confidence after stair training. (Has only 3 steps at home). She is overall at supervision level,  would benefit from but likely not able to receive [facility] services d/t lack of ins. Continue to follow in acute setting   Recommendations for follow up therapy are one component of a multi-disciplinary discharge planning process, led by the attending physician.  Recommendations may be updated based on patient status, additional functional criteria and insurance authorization.  Follow Up Recommendations  Home health PT--likely will not be able to get [facility], no ins     Assistance Recommended at Discharge Frequent or constant Supervision/Assistance  Patient can return home with the following Assistance with  cooking/housework;Help with stairs or ramp for entrance   Equipment Recommendations  None recommended by PT (has walker)    Recommendations for Other Services       Precautions / Restrictions Precautions Precautions: Fall Restrictions Weight Bearing Restrictions: No RUE Weight Bearing: Weight bearing as tolerated Other Position/Activity Restrictions: recent R UE/shoulder fx-- chart review RUE WB status expired on 10/27/21.     Mobility  Bed Mobility Overal bed mobility: Needs Assistance Bed Mobility: Supine to Sit     Supine to sit: Supervision, HOB elevated     General bed mobility comments: incr time, supervision for safety    Transfers Overall transfer level: Needs assistance Equipment used: Rolling walker (2 wheels) Transfers: Sit to/from Stand Sit to Stand: Min guard, Supervision           General transfer comment: cues to power up from bed with  LEs. incr time, 2 attempts    Ambulation/Gait Ambulation/Gait assistance: Min guard Gait Distance (Feet): 160 Feet Assistive device: Rolling walker (2 wheels) Gait Pattern/deviations: Step-through pattern, Decreased stride length, Drifts right/left, Trunk flexed       General Gait Details: cues for posture, breathing, safety with turns   Stairs Stairs: Yes Stairs assistance: Min guard Stair Management: Step to pattern, With walker, Forwards Number of Stairs: 3 (x3) General stair comments: cues for technique and sequence. pt expresses incr confidence after practice. min/guard for safety   Wheelchair Mobility    Modified Rankin (Stroke Patients Only)       Balance             Standing balance-Leahy Scale: Fair Standing balance comment: pt able to maintain static standing without UE support, reliant on UEs for dynamic  tasks                            Cognition Arousal/Alertness: Awake/alert Behavior During Therapy: WFL for tasks assessed/performed Overall Cognitive Status: Within  Functional Limits for tasks assessed                                          Exercises      General Comments        Pertinent Vitals/Pain Pain Assessment Pain Assessment: Faces Faces Pain Scale: Hurts a little bit Pain Location: R shoulder Pain Descriptors / Indicators: Discomfort Pain Intervention(s): Limited activity within patient's tolerance, Monitored during session, Premedicated before session, Repositioned    Home Living                          Prior Function            PT Goals (current goals can now be found in the care plan section) Acute Rehab PT Goals PT Goal Formulation: With patient Time For Goal Achievement: 12/02/21 Potential to Achieve Goals: Good Progress towards PT goals: Progressing toward goals    Frequency    Min 3X/week      PT Plan Current plan remains appropriate    Co-evaluation              AM-PAC PT "6 Clicks" Mobility   Outcome Measure  Help needed turning from your back to your side while in a flat bed without using bedrails?: A Little Help needed moving from lying on your back to sitting on the side of a flat bed without using bedrails?: A Little Help needed moving to and from a bed to a chair (including a wheelchair)?: A Little Help needed standing up from a chair using your arms (e.g., wheelchair or bedside chair)?: A Little Help needed to walk in hospital room?: A Little Help needed climbing 3-5 steps with a railing? : A Little 6 Click Score: 18    End of Session Equipment Utilized During Treatment: Gait belt Activity Tolerance: Patient tolerated treatment well Patient left: in chair;with call bell/phone within reach;with nursing/sitter in room   PT Visit Diagnosis: Muscle weakness (generalized) (M62.81);History of falling (Z91.81);Difficulty in walking, not elsewhere classified (R26.2);Pain Pain - Right/Left: Right Pain - part of body: Shoulder     Time: 1012-1040 PT Time  Calculation (min) (ACUTE ONLY): 28 min  Charges:  $Gait Training: 23-37 mins                     Baxter Flattery, PT  Acute Rehab Dept ([place]) [phone] Pager [phone]  12/05/2021    [facility] 12/05/2021, 10:50 AM

## 2021-12-05 NOTE — Progress Notes (Signed)
44 Days Post-Op   Subjective/Chief Complaint: No new complaints   Objective: Vital signs in last 24 hours: Temp:  [97.7 F (36.5 C)-98.7 F (37.1 C)] 98.5 F (36.9 C) (05/29 0558) Pulse Rate:  [75-89] 84 (05/29 0558) Resp:  [15-17] 17 (05/29 0558) BP: (145-152)/(84-99) 147/84 (05/29 0558) SpO2:  [98 %-100 %] 100 % (05/29 0558) Last BM Date : 12/03/21  Intake/Output from previous day: 05/28 0701 - 05/29 0700 In: 2210.4 [P.O.:2100; IV Piggyback:110.4] Out: 8299 [Urine:4200; Drains:425; Stool:500] Intake/Output this shift: No intake/output data recorded.  General appearance: alert and cooperative GI: soft, not really tender. Ostomy with feculent output, 500cc output yesterday.  Eakin's pouch with some feculent output in bag.    Lab Results:  Recent Labs    12/03/21 0414 12/04/21 0325  WBC 6.8 6.8  HGB 7.7* 8.0*  HCT 22.8* 24.2*  PLT 267 294   BMET Recent Labs    12/04/21 0325 12/05/21 0313  NA 125* 127*  K 3.9 4.0  CL 101 103  CO2 16* 18*  GLUCOSE 103* 104*  BUN 22* 19  CREATININE 0.81 0.74  CALCIUM 7.9* 8.0*   PT/INR No results for input(s): LABPROT, INR in the last 72 hours. ABG No results for input(s): PHART, HCO3 in the last 72 hours.  Invalid input(s): PCO2, PO2  Studies/Results: No results found.  Anti-infectives: Anti-infectives (From admission, onward)    Start     Dose/Rate Route Frequency Ordered Stop   11/30/21 0600  vancomycin (VANCOREADY) IVPB 750 mg/150 mL  Status:  Discontinued        750 mg 150 mL/hr over 60 Minutes Intravenous Every 12 hours 11/29/21 1640 12/01/21 1021   11/29/21 1700  vancomycin (VANCOREADY) IVPB 1250 mg/250 mL        1,250 mg 166.7 mL/hr over 90 Minutes Intravenous  Once 11/29/21 1605 11/29/21 1841   11/14/21 0800  piperacillin-tazobactam (ZOSYN) IVPB 3.375 g        3.375 g 12.5 mL/hr over 240 Minutes Intravenous Every 8 hours 11/14/21 0739     11/04/21 1000  piperacillin-tazobactam (ZOSYN) IVPB 3.375 g         3.375 g 12.5 mL/hr over 240 Minutes Intravenous Every 8 hours 11/04/21 0935 11/11/21 1312   10/27/21 1600  cefTRIAXone (ROCEPHIN) 2 g in sodium chloride 0.9 % 100 mL IVPB  Status:  Discontinued        2 g 200 mL/hr over 30 Minutes Intravenous Every 24 hours 10/27/21 0843 10/27/21 1106   10/27/21 1600  piperacillin-tazobactam (ZOSYN) IVPB 3.375 g        3.375 g 12.5 mL/hr over 240 Minutes Intravenous Every 8 hours 10/27/21 1106 11/03/21 2000   10/27/21 1400  metroNIDAZOLE (FLAGYL) IVPB 500 mg  Status:  Discontinued        500 mg 100 mL/hr over 60 Minutes Intravenous Every 12 hours 10/27/21 0843 10/27/21 1106   10/24/21 1600  piperacillin-tazobactam (ZOSYN) IVPB 3.375 g  Status:  Discontinued        3.375 g 12.5 mL/hr over 240 Minutes Intravenous Every 8 hours 10/24/21 1422 10/27/21 0843   10/24/21 1000  cefTRIAXone (ROCEPHIN) 2 g in sodium chloride 0.9 % 100 mL IVPB  Status:  Discontinued        2 g 200 mL/hr over 30 Minutes Intravenous Every 24 hours 10/24/21 0747 10/24/21 1357   10/23/21 1530  vancomycin (VANCOCIN) IVPB 1000 mg/200 mL premix        1,000 mg 200 mL/hr over 60 Minutes  Intravenous  Once 10/23/21 1439 10/23/21 1547   10/23/21 0200  ceFEPIme (MAXIPIME) 2 g in sodium chloride 0.9 % 100 mL IVPB  Status:  Discontinued        2 g 200 mL/hr over 30 Minutes Intravenous Every 12 hours 10/22/21 2032 10/24/21 0747   10/22/21 2200  cefOXitin (MEFOXIN) 2 g in sodium chloride 0.9 % 100 mL IVPB  Status:  Discontinued        2 g 200 mL/hr over 30 Minutes Intravenous Every 8 hours 10/22/21 1706 10/22/21 2032   10/22/21 1956  vancomycin variable dose per unstable renal function (pharmacist dosing)  Status:  Discontinued         Does not apply See admin instructions 10/22/21 1957 10/23/21 1917   10/22/21 1245  vancomycin (VANCOCIN) IVPB 1000 mg/200 mL premix        1,000 mg 200 mL/hr over 60 Minutes Intravenous  Once 10/22/21 1241 10/22/21 1402   10/22/21 1245  ceFEPIme (MAXIPIME) 2 g  in sodium chloride 0.9 % 100 mL IVPB        2 g 200 mL/hr over 30 Minutes Intravenous  Once 10/22/21 1241 10/22/21 1321   10/22/21 1230  metroNIDAZOLE (FLAGYL) IVPB 500 mg  Status:  Discontinued        500 mg 100 mL/hr over 60 Minutes Intravenous Every 12 hours 10/22/21 1222 10/24/21 1357       Assessment/Plan: POD#44 - PERFORATED SIGMOID COLON - status post EX LAP, HARTMAN'S PROCEDURE, Gastrostomy tube placement - 01/10/5008 Dr. 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 WOCN following for colostomy and pouching of fistula - CT 5/4 and 5/11 with pelvic fluid collection measuring 6 x 3.2 cm. Discussed with IR and not felt amenable/safe for drainage. -  G-tube clamped 5/7. Patient tolerating. Tolerating ice chips/sips. SLP following  - CT A/P repeated 5/17 due to low grade fever and tachycardia - shows interval decrease in intra abdominal fluid collections. Pleural effusion. Generalized body wall edema.  -CT A/P 5/23 stable to improved diffusely.     -Better PO intake.  Will drink Ensure as able between meals.   -DC TNA 5/25 -IR drain removed on 5/24 -eakin's pouch output minimal -ostomy around 550cc yesterday -d/w primary and ID about starting to plan for DC dispo which is likely home early next week -HH PT/OT/RN orders written    FEN - G-tube capped, diet SOFT VTE - SCDs, SQH ID - zosyn 4/17 - 4/27; 4/28-5/6, 5/8 >> (4-6 weeks per ID)  LOS: 44 days    Jessica [name] 12/05/2021

## 2021-12-05 NOTE — TOC Progression Note (Signed)
Transition of Care [facility]) - Progression Note    Patient Details  Name: Jessica [name] MRN: [id number] Date of Birth: Feb 06, 1961  Transition of [facility]) CM/SW Contact  Lennart Pall, LCSW Phone Number: 12/05/2021, 3:20 PM  Clinical Narrative:    Met with pt and caregiver, Jeneen Rinks, this afternoon and both preparing for probable dc tomorrow.  [name] aware that Carolynn Sayers, RN with [facility], will be reaching out to schedule education on home abx routine.  I have secured [facility] to provide [facility] visits 2x/wk and pt/[name] are aware.  They are requesting 3n1 commode and have asked RN to place order.  Pt feeling more comfortable with dc now that she has been able to practice the stairs with PT earlier today.  Will follow up again with pt in the morning.   Expected Discharge Plan: Home/Self Care (TBD) Barriers to Discharge: Continued Medical Work up, Inadequate or no insurance, SNF Pending bed offer  Expected Discharge Plan and Services Expected Discharge Plan: Home/Self Care (TBD)   Discharge Planning Services: CM Consult   Living arrangements for the past 2 months: Single Family Home                                       Social Determinants of Health (SDOH) Interventions    Readmission Risk Interventions    10/24/2021   10:10 AM  Readmission Risk Prevention Plan  Transportation Screening Complete  PCP or Specialist Appt within 3-5 Days Complete  HRI or [place] Complete  Social Work Consult for [place] Planning/Counseling Complete  Palliative Care Screening Complete  Medication Review Press photographer) Complete

## 2021-12-06 ENCOUNTER — Inpatient Hospital Stay: Payer: Self-pay

## 2021-12-06 LAB — COMPREHENSIVE METABOLIC PANEL
ALT: 23 U/L (ref 0–44)
AST: 19 U/L (ref 15–41)
Albumin: 2.1 g/dL — ABNORMAL LOW (ref 3.5–5.0)
Alkaline Phosphatase: 96 U/L (ref 38–126)
Anion gap: 8 (ref 5–15)
BUN: 16 mg/dL (ref 6–20)
CO2: 18 mmol/L — ABNORMAL LOW (ref 22–32)
Calcium: 8 mg/dL — ABNORMAL LOW (ref 8.9–10.3)
Chloride: 101 mmol/L (ref 98–111)
Creatinine, Ser: 0.8 mg/dL (ref 0.44–1.00)
GFR, Estimated: >60 mL/min (ref >60)
Glucose, Bld: 98 mg/dL (ref 70–99)
Potassium: 3.8 mmol/L (ref 3.5–5.1)
Sodium: 127 mmol/L — ABNORMAL LOW (ref 135–145)
Total Bilirubin: 0.5 mg/dL (ref 0.3–1.2)
Total Protein: 6.4 g/dL — ABNORMAL LOW (ref 6.5–8.1)

## 2021-12-06 LAB — CBC
HCT: 23.4 % — ABNORMAL LOW (ref 36.0–46.0)
Hemoglobin: 8 g/dL — ABNORMAL LOW (ref 12.0–15.0)
MCH: 30.2 pg (ref 26.0–34.0)
MCHC: 34.2 g/dL (ref 30.0–36.0)
MCV: 88.3 fL (ref 80.0–100.0)
Platelets: 275 10*3/uL (ref 150–400)
RBC: 2.65 MIL/uL — ABNORMAL LOW (ref 3.87–5.11)
RDW: 17.8 % — ABNORMAL HIGH (ref 11.5–15.5)
WBC: 6.7 10*3/uL (ref 4.0–10.5)
nRBC: 0 % (ref 0.0–0.2)

## 2021-12-06 MED ORDER — COVID-19MRNA BIVAL VACC PFIZER 30 MCG/0.3ML IM SUSP: 0.3000 mL | Freq: Once | INTRAMUSCULAR | Status: DC

## 2021-12-06 MED ORDER — OXYCODONE HCL 5 MG PO TABS: 5.0000 mg | ORAL_TABLET | ORAL | 0 refills | Status: DC | PRN

## 2021-12-06 MED ORDER — FERROUS SULFATE 325 (65 FE) MG PO TABS: 325.0000 mg | ORAL_TABLET | Freq: Two times a day (BID) | ORAL | 0 refills | Status: DC

## 2021-12-06 MED ORDER — ACETAMINOPHEN 325 MG PO TABS: 650.0000 mg | ORAL_TABLET | Freq: Four times a day (QID) | ORAL | Status: AC | PRN

## 2021-12-06 MED ORDER — CALCIUM POLYCARBOPHIL 625 MG PO TABS: 625.0000 mg | ORAL_TABLET | Freq: Two times a day (BID) | ORAL | Status: DC

## 2021-12-06 MED ORDER — METOPROLOL TARTRATE 50 MG PO TABS: 50.0000 mg | ORAL_TABLET | Freq: Two times a day (BID) | ORAL | 0 refills | Status: DC

## 2021-12-06 MED ORDER — LOPERAMIDE HCL 2 MG PO CAPS: 4.0000 mg | ORAL_CAPSULE | Freq: Two times a day (BID) | ORAL | 0 refills | Status: DC

## 2021-12-06 MED ORDER — METHOCARBAMOL 500 MG PO TABS
500.0000 mg | ORAL_TABLET | Freq: Three times a day (TID) | ORAL | 0 refills | Status: DC | PRN
Start: 2021-12-06 — End: 2022-06-28

## 2021-12-06 MED ORDER — ASCORBIC ACID 500 MG PO TABS: 500.0000 mg | ORAL_TABLET | Freq: Two times a day (BID) | ORAL | 0 refills | Status: AC

## 2021-12-06 MED ORDER — GABAPENTIN 300 MG PO CAPS: 300.0000 mg | ORAL_CAPSULE | Freq: Every day | ORAL | 0 refills | Status: DC

## 2021-12-06 MED ORDER — PIPERACILLIN-TAZOBACTAM IV (FOR PTA / DISCHARGE USE ONLY): 13.5000 g | INTRAVENOUS | 0 refills | Status: DC

## 2021-12-06 MED ORDER — LOPERAMIDE HCL 2 MG PO CAPS
4.0000 mg | ORAL_CAPSULE | Freq: Two times a day (BID) | ORAL | Status: DC
Start: 2021-12-06 — End: 2021-12-06

## 2021-12-06 NOTE — Progress Notes (Addendum)
45 Days Post-Op  Subjective: CC: Patient reports she is tolerating soft diet and eating more.  Finished 50-75% of trays yesterday. No ensure yesterday but did drink them on "Sunday.  Will try to drink them today.  No nausea or vomiting.  It appears on I/O that she started having output from eakin's again on ~5/28.  She is unaware how much is coming out.  Ostomy is still productive.  Patient is mobilizing without difficulty in the halls.  Did stairs with PT yesterday. PT rec HH. Voiding.  She reports her friend that is retired medic in the military plans to help her at home.  TOC arranging HH.  WOC to see again today. Working on weaning off IV pain meds.  Objective: Vital signs in last 24 hours: Temp:  [98.4 F (36.9 C)-98.7 F (37.1 C)] 98.7 F (37.1 C) (05/30 0531) Pulse Rate:  [74-81] 77 (05/30 0531) Resp:  [16-18] 16 (05/30 0531) BP: (116-136)/(68-79) 124/71 (05/30 0531) SpO2:  [99 %-100 %] 99 % (05/30 0531) Last BM Date : 12/03/21  Intake/Output from previous day: 05/29 0701 - 05/30 0700 In: 1604.7 [P.O.:1415; IV Piggyback:189.7] Out: 2550 [Urine:2200; Drains:200; Stool:150] Intake/Output this shift: Total I/O In: -  Out: 300 [Urine:300]  PE: Gen:  Alert, NAD, pleasant Abd: Soft, ND, some mild tenderness around her midline wound and ostomy. G-tube clamped, site clean. Midline wound with eakins in place w/ some liquid stool in bag. Stoma budded and viable. Ostomy bag with air and liquid stool in bag.  Ext:  No LE edema Psych: A&Ox3  Lab Results:  Recent Labs    12/04/21 0325 12/06/21 0542  WBC 6.8 6.7  HGB 8.0* 8.0*  HCT 24.2* 23.4*  PLT 294 275   BMET Recent Labs    12/05/21 0313 12/06/21 0542  NA 127* 127*  K 4.0 3.8  CL 103 101  CO2 18* 18*  GLUCOSE 104* 98  BUN 19 16  CREATININE 0.74 0.80  CALCIUM 8.0* 8.0*   PT/INR No results for input(s): LABPROT, INR in the last 72 hours. CMP     Component Value Date/Time   NA 127 (L) 12/06/2021 0542   K  3.8 12/06/2021 0542   CL 101 12/06/2021 0542   CO2 18 (L) 12/06/2021 0542   GLUCOSE 98 12/06/2021 0542   BUN 16 12/06/2021 0542   CREATININE 0.80 12/06/2021 0542   CALCIUM 8.0 (L) 12/06/2021 0542   PROT 6.4 (L) 12/06/2021 0542   ALBUMIN 2.1 (L) 12/06/2021 0542   AST 19 12/06/2021 0542   ALT 23 12/06/2021 0542   ALKPHOS 96 12/06/2021 0542   BILITOT 0.5 12/06/2021 0542   GFRNONAA >60 12/06/2021 0542   GFRAA >60 12/12/2017 0433   Lipase     Component Value Date/Time   LIPASE 27 10/22/2021 1141    Studies/Results: No results found.  Anti-infectives: Anti-infectives (From admission, onward)    Start     Dose/Rate Route Frequency Ordered Stop   11/30/21 0600  vancomycin (VANCOREADY) IVPB 750 mg/150 mL  Status:  Discontinued        75"$ 0 mg 150 mL/hr over 60 Minutes Intravenous Every 12 hours 11/29/21 1640 12/01/21 1021   11/29/21 1700  vancomycin (VANCOREADY) IVPB 1250 mg/250 mL        1,250 mg 166.7 mL/hr over 90 Minutes Intravenous  Once 11/29/21 1605 11/29/21 1841   11/14/21 0800  piperacillin-tazobactam (ZOSYN) IVPB 3.375 g        3.375 g 12.5 mL/hr over  240 Minutes Intravenous Every 8 hours 11/14/21 0739     11/04/21 1000  piperacillin-tazobactam (ZOSYN) IVPB 3.375 g        3.375 g 12.5 mL/hr over 240 Minutes Intravenous Every 8 hours 11/04/21 0935 11/11/21 1312   10/27/21 1600  cefTRIAXone (ROCEPHIN) 2 g in sodium chloride 0.9 % 100 mL IVPB  Status:  Discontinued        2 g 200 mL/hr over 30 Minutes Intravenous Every 24 hours 10/27/21 0843 10/27/21 1106   10/27/21 1600  piperacillin-tazobactam (ZOSYN) IVPB 3.375 g        3.375 g 12.5 mL/hr over 240 Minutes Intravenous Every 8 hours 10/27/21 1106 11/03/21 2000   10/27/21 1400  metroNIDAZOLE (FLAGYL) IVPB 500 mg  Status:  Discontinued        500 mg 100 mL/hr over 60 Minutes Intravenous Every 12 hours 10/27/21 0843 10/27/21 1106   10/24/21 1600  piperacillin-tazobactam (ZOSYN) IVPB 3.375 g  Status:  Discontinued         3.375 g 12.5 mL/hr over 240 Minutes Intravenous Every 8 hours 10/24/21 1422 10/27/21 0843   10/24/21 1000  cefTRIAXone (ROCEPHIN) 2 g in sodium chloride 0.9 % 100 mL IVPB  Status:  Discontinued        2 g 200 mL/hr over 30 Minutes Intravenous Every 24 hours 10/24/21 0747 10/24/21 1357   10/23/21 1530  vancomycin (VANCOCIN) IVPB 1000 mg/200 mL premix        1,000 mg 200 mL/hr over 60 Minutes Intravenous  Once 10/23/21 1439 10/23/21 1547   10/23/21 0200  ceFEPIme (MAXIPIME) 2 g in sodium chloride 0.9 % 100 mL IVPB  Status:  Discontinued        2 g 200 mL/hr over 30 Minutes Intravenous Every 12 hours 10/22/21 2032 10/24/21 0747   10/22/21 2200  cefOXitin (MEFOXIN) 2 g in sodium chloride 0.9 % 100 mL IVPB  Status:  Discontinued        2 g 200 mL/hr over 30 Minutes Intravenous Every 8 hours 10/22/21 1706 10/22/21 2032   10/22/21 1956  vancomycin variable dose per unstable renal function (pharmacist dosing)  Status:  Discontinued         Does not apply See admin instructions 10/22/21 1957 10/23/21 1917   10/22/21 1245  vancomycin (VANCOCIN) IVPB 1000 mg/200 mL premix        1,000 mg 200 mL/hr over 60 Minutes Intravenous  Once 10/22/21 1241 10/22/21 1402   10/22/21 1245  ceFEPIme (MAXIPIME) 2 g in sodium chloride 0.9 % 100 mL IVPB        2 g 200 mL/hr over 30 Minutes Intravenous  Once 10/22/21 1241 10/22/21 1321   10/22/21 1230  metroNIDAZOLE (FLAGYL) IVPB 500 mg  Status:  Discontinued        500 mg 100 mL/hr over 60 Minutes Intravenous Every 12 hours 10/22/21 1222 10/24/21 1357        Assessment/Plan POD#45 - PERFORATED SIGMOID COLON - status post EX LAP, HARTMAN'S PROCEDURE, Gastrostomy tube placement - 8/65/7846 Dr. Leighton Ruff; OR FINDINGS: Purulent ascites throughout the abdomen and stool contamination in the pelvis due to necrotic perforated sigmoid colon - Path with diverticulitis, no malignancy - Status post drainage of LLQ fluid collection by IR - 4/22, Cx with pseudomonas;  drain adjusted and upsized 5/10 - appeared feculent  - CT 5/4 and 5/11 with pelvic fluid collection measuring 6 x 3.2 cm. Discussed with IR and not felt amenable/safe for drainage. - CT A/P repeated  5/17 due to low grade fever and tachycardia - shows interval decrease in intra abdominal fluid collections. Pleural effusion. Generalized body wall edema.  - CT A/P 5/23 stable to improved diffusely.   - IR drain out 5/24 - ID following. Recommends IV abx via PICC through 6/23 - G-tube clamped. TNA off. Tolerating diet and ensures.  - Midline wound with colocutaneous fistula. Continue Eakins pouch. WOCN following for colostomy and pouching of fistula. Starting to have output from this again. Discussed w/ one of our colorectal surgeons. Okay to continue diet, would not make npo and restart TNA. Cont fiber and iron. Increase imodium. - HH PT/OT/RN orders written. TOC working on if they can arrange [facility] PT/OT/RN and supplies (ostomy supplies and eakin's). Hopefully home soon if this can be arranged.    FEN - G-tube capped, diet SOFT VTE - SCDs, SQH ID - zosyn 4/17 - 4/27; 4/28-5/6, 5/8 >> (4-6 weeks per ID)   LOS: 45 days    Jessica [name] , [facility] 12/06/2021, 8:24 AM Please see Amion for pager number during day hours 7:00am-4:30pm

## 2021-12-06 NOTE — Consult Note (Addendum)
[facility] Nurse ostomy consult note Pt plans for possible discharge today.  I have discussed the fact that the patient does not have insurance with the Social worker via secure chat.  Husband at the bedside to discuss pouching routines.  I offered to perform another Eakin pouch and ostomy pouch change demonstration but he declined.  Both pouches were changed early this morning, according to the patient, and they are intact with good seals. Pt's husband states he was able to perform all the steps for both pouch changes with Domenic Moras, [place] nurse, on 5/25 and "I feel confident that I can do it at home." He also states he is able to open and close both pouches to empty.   Left colostomy pouch with mod amt semi formed brown stool Right Eakin pouch with small amt unformed brown stool over full thickness wound with a fistula.  Discussed pouching routines and ordering supplies.    1. 6 sets of barrier rings Kellie Simmering # [id number] Kellie Simmering # 2/Pouches Lawson # [phone] left at the bedside for use after discharge.  2. Ordered 5 sets of Eakin pouches Kellie Simmering # [phone]) from the store room and they will be placed in the patient's room prior to discharge, according to the unit secretary.   3.  Provided husband with [employer] supply program phone number and information and he states he understands he needs to call them after discharge to obtain further supplies.  4. Provided them with ostomy outpatient clinic information if they have challenges and later want to request an appointment.  5. Provided them with purchasing information from possible web sites online to order [employer], such as [employer] [phone] for a box of 5 pouches) or Eb[employer] ($139 for box of 10) The item is  [employer] # [id number] and the size is 6.9X 4.3 inches 6. Discussed with them if the wound drainage begins to decrease, a dressing could be applied instead of an Eakin pouch. They verbalized understanding and denied further questions at this  time.   Julien Girt MSN, RN, [place], [place], [place]

## 2021-12-06 NOTE — Progress Notes (Signed)
Palliative care brief note  I briefly saw Jessica [name] and her significant other when she was ambulating in the hall this morning.  Plan is to work toward discharge home with home health as soon as later today.  Denies needs and no other palliative specific recommendations at this time.  Micheline Rough, MD [place] Team [phone]  No charge note

## 2021-12-06 NOTE — Discharge Instructions (Signed)
[facility]      [facility] [phone]  OPEN ABDOMINAL SURGERY: POST OP INSTRUCTIONS  Always review your discharge instruction sheet given to you by the facility where your surgery was performed.  IF YOU HAVE DISABILITY OR FAMILY LEAVE FORMS, YOU MUST BRING THEM TO THE OFFICE FOR PROCESSING.  PLEASE DO NOT GIVE THEM TO YOUR DOCTOR.  A prescription for pain medication may be given to you upon discharge.  Take your pain medication as prescribed, if needed.  If narcotic pain medicine is not needed, then you may take acetaminophen (Tylenol) or ibuprofen (Advil) as needed. Take your usually prescribed medications unless otherwise directed. If you need a refill on your pain medication, please contact your pharmacy. They will contact our office to request authorization.  Prescriptions will not be filled after 5pm or on week-ends. You should follow a light diet the first few days after arrival home, such as soup and crackers, pudding, etc.unless your doctor has advised otherwise. A high-fiber, low fat diet can be resumed as tolerated.   Be sure to include lots of fluids daily. Most patients will experience some swelling and bruising on the chest and neck area.  Ice packs will help.  Swelling and bruising can take several days to resolve Most patients will experience some swelling and bruising in the area of the incision. Ice pack will help. Swelling and bruising can take several days to resolve..  It is common to experience some constipation if taking pain medication after surgery.  Increasing fluid intake and taking a stool softener will usually help or prevent this problem from occurring.  A mild laxative (Milk of Magnesia or Miralax) should be taken according to package directions if there are no bowel movements after 48 hours.  You may have steri-strips (small skin tapes) in place directly over the incision.  These strips should be left on the skin for 7-10 days.  If your surgeon used skin  glue on the incision, you may shower in 24 hours.  The glue will flake off over the next 2-3 weeks.  Any sutures or staples will be removed at the office during your follow-up visit. You may find that a light gauze bandage over your incision may keep your staples from being rubbed or pulled. You may shower and replace the bandage daily. ACTIVITIES:  You may resume regular (light) daily activities beginning the next day--such as daily self-care, walking, climbing stairs--gradually increasing activities as tolerated.  You may have sexual intercourse when it is comfortable.  Refrain from any heavy lifting or straining until approved by your doctor. You may drive when you no longer are taking prescription pain medication, you can comfortably wear a seatbelt, and you can safely maneuver your car and apply brakes Return to Work: ___________________________________ You should see your doctor in the office for a follow-up appointment approximately two weeks after your surgery.  Make sure that you call for this appointment within a day or two after you arrive home to insure a convenient appointment time. OTHER INSTRUCTIONS:  _____________________________________________________________ _____________________________________________________________  WHEN TO CALL YOUR DOCTOR: Fever over 101.0 Inability to urinate Nausea and/or vomiting Extreme swelling or bruising Continued bleeding from incision. Increased pain, redness, or drainage from the incision. Difficulty swallowing or breathing Muscle cramping or spasms. Numbness or tingling in hands or feet or around lips.  The clinic staff is available to answer your questions during regular business hours.  Please don't hesitate to call and ask to speak to one of   the nurses if you have concerns.  For further questions, please visit [email or web address]  

## 2021-12-06 NOTE — TOC Transition Note (Signed)
Transition of Care [facility]) - CM/SW Discharge Note   Patient Details  Name: Jessica [name] MRN: [id number] Date of Birth: [birth year]  [facility]) CM/SW Contact:  Lennart Pall, LCSW Phone Number: 12/06/2021, 12:41 PM   Clinical Narrative:    Pt is medically cleared to dc home today with friend, [name] [place] to be primary caregiver.  He has completed all teaching with [place] RN as well as Carolynn Sayers, RN with [facility].  Pt/ caregiver aware of out of pocket costs for Eakin pouches and state they will get these on their own.  Spoke with pt about PCP coverage as she is uninsured and could access a [facility] clinic, however, she wants to continue to be followed by Dr. Shelia Media. [facility] in place to provide [facility] visits under [facility].  No further TOC needs.   Final next level of care: [place] Barriers to Discharge: Barriers Resolved   Patient Goals and CMS Choice Patient states their goals for this hospitalization and ongoing recovery are:: Home CMS Medicare.gov Compare Post Acute Care list provided to:: Patient Represenative (must comment)    Discharge Placement                       Discharge Plan and Services   Discharge Planning Services: CM Consult            DME Arranged: 3-N-1 DME Agency: [employer] Date DME Agency Contacted: 12/06/21 Time DME Agency Contacted: 0900   HH Arranged: RN, IV Antibiotics HH Agency: [facility] Date [place]: 12/02/21   Representative spoke with at [place]: [employer];  [place]  Social Determinants of Health (SDOH) Interventions     Readmission Risk Interventions    12/06/2021   12:35 PM 10/24/2021   10:10 AM  Readmission Risk Prevention Plan  Transportation Screening Complete Complete  PCP or Specialist Appt within 3-5 Days  Complete  HRI or [place]  Complete  Social Work Consult for [place] Planning/Counseling  Complete  Palliative Care  Screening  Complete  Medication Review Press photographer) Complete Complete  PCP or Specialist appointment within 3-5 days of discharge Complete   HRI or [place] Complete   SW Recovery Care/Counseling Consult Complete   [place] Not Applicable

## 2021-12-06 NOTE — Progress Notes (Signed)
PROGRESS NOTE    Jessica [name]  BTD:[id number] DOB: [birth year] DOA: 10/22/2021 PCP: Deland Pretty, MD   Brief Narrative:  61 year old female with history of alcohol abuse, several alcohol withdrawals requiring admissions, tobacco use presented on 10/22/2021 with diffuse abdominal pain with CT of abdomen and pelvis showed pneumoperitoneum and free fluid.  She was started on broad-spectrum antibiotics and emergently taken to the OR and underwent exploratory laparotomy where she was found to have a perforated sigmoid colon.  She is status post Hartmann procedure and insertion of G-tube.  She was found to have Klebsiella bacteremia.  Hospital course complicated by development of left lower quadrant fluid collection status post IR drainage on 10/29/2021, drain cultures grew Pseudomonas.  This required having her drain adjusted and upsized on 510, fluid appeared feculent.  Drain was eventually removed.  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  She had a repeat CT on 11/10/2021 with pelvic fluid collection measuring 6 x 3.2 cm.  This was discussed with IR and it was not felt safe or amenable to drainage.  She was continued on IV antibiotics.  ID was consulted.  Repeat CT scan showed stable pelvic abscess also remaining unable to be drained.  She was briefly on TPN but currently has been weaned off and she is eating.  Palliative care was also consulted for goals of care discussion.  Currently remains full code.  Currently, plans are to continue IV Zosyn for at least the next 4 weeks with a repeat CT scan in the next 2 to 3 weeks.  Assessment & Plan:   Complicated intra-abdominal infection/Pseudomonas intra-abdominal abscess Perforated sigmoid colon status post expiratory laparotomy/Hartman's procedure/G-tube placement Septic shock: Present on admission, resolved Klebsiella bacteremia [facility] course as above. -She was briefly on TPN but currently has  been weaned off and she is eating. -General surgery following.   -Currently on Zosyn IV as per ID recommendations: ID recommends to continue IV Zosyn 13.5 g continuous infusion every 24 hours with end date of 12/30/2021 -Septic shock has already resolved.  Off pressors -Currently afebrile and hemodynamically stable.  Blood cultures from 11/29/2021 are negative so far -TOC working on setting up safe home discharge with supplies and home health RN  Acute respiratory failure with hypoxia/aspiration pneumonia -Has required intubation and extubation twice.  Extubated again on 11/08/2021.  Currently on room air.  Acute metabolic encephalopathy -Multifactorial due to ICU delirium/infectious etiology/alcohol withdrawal -Mental status has mostly improved.  Monitor mental status. -Fall precautions  AKI -Resolved.  Essential hypertension -Monitor blood pressure.  Continue metoprolol.  Anemia of chronic disease -Has required 4 units packed red cells transfusion during the hospitalization with last transfusion being on 11/24/2021 for hemoglobin of 6.9. -Hemoglobin 8 today.  Monitor H&H intermittently.  Hyponatremia -Possibly from increased water intake. -Sodium 1 2700 today.  Improving. -Was on salt tablets but patient did not like it, now discontinued.  Also received several days of IV Lasix. -Continue fluid restriction.  Repeat a.m. labs.   Severe protein calorie malnutrition/hypoalbuminemia Anorexia -Nutrition following.  Hypomagnesemia -Resolved  Substance-induced mood disorder/anxiety -Continue lorazepam as needed.  Physical deconditioning -PT recommends home health PT.  Social worker following.  DVT prophylaxis: Heparin subcutaneous Code Status: Full Family Communication: None at bedside Disposition Plan: Status is: Inpatient Remains inpatient appropriate because: Of severity of illness.  TOC following and working on safe discharge plan and arrangement of supplies at home along  with home health RN.  Discharge once everything is arranged by TOC.    Consultants: General surgery/palliative care/ID/PCCM/IR  Procedures: As above  Antimicrobials:  Anti-infectives (From admission, onward)    Start     Dose/Rate Route Frequency Ordered Stop   11/30/21 0600  vancomycin (VANCOREADY) IVPB 750 mg/150 mL  Status:  Discontinued        750 mg 150 mL/hr over 60 Minutes Intravenous Every 12 hours 11/29/21 1640 12/01/21 1021   11/29/21 1700  vancomycin (VANCOREADY) IVPB 1250 mg/250 mL        1,250 mg 166.7 mL/hr over 90 Minutes Intravenous  Once 11/29/21 1605 11/29/21 1841   11/14/21 0800  piperacillin-tazobactam (ZOSYN) IVPB 3.375 g        3.375 g 12.5 mL/hr over 240 Minutes Intravenous Every 8 hours 11/14/21 0739     11/04/21 1000  piperacillin-tazobactam (ZOSYN) IVPB 3.375 g        3.375 g 12.5 mL/hr over 240 Minutes Intravenous Every 8 hours 11/04/21 0935 11/11/21 1312   10/27/21 1600  cefTRIAXone (ROCEPHIN) 2 g in sodium chloride 0.9 % 100 mL IVPB  Status:  Discontinued        2 g 200 mL/hr over 30 Minutes Intravenous Every 24 hours 10/27/21 0843 10/27/21 1106   10/27/21 1600  piperacillin-tazobactam (ZOSYN) IVPB 3.375 g        3.375 g 12.5 mL/hr over 240 Minutes Intravenous Every 8 hours 10/27/21 1106 11/03/21 2000   10/27/21 1400  metroNIDAZOLE (FLAGYL) IVPB 500 mg  Status:  Discontinued        500 mg 100 mL/hr over 60 Minutes Intravenous Every 12 hours 10/27/21 0843 10/27/21 1106   10/24/21 1600  piperacillin-tazobactam (ZOSYN) IVPB 3.375 g  Status:  Discontinued        3.375 g 12.5 mL/hr over 240 Minutes Intravenous Every 8 hours 10/24/21 1422 10/27/21 0843   10/24/21 1000  cefTRIAXone (ROCEPHIN) 2 g in sodium chloride 0.9 % 100 mL IVPB  Status:  Discontinued        2 g 200 mL/hr over 30 Minutes Intravenous Every 24 hours 10/24/21 0747 10/24/21 1357   10/23/21 1530  vancomycin (VANCOCIN) IVPB 1000 mg/200 mL premix        1,000 mg 200 mL/hr over 60 Minutes  Intravenous  Once 10/23/21 1439 10/23/21 1547   10/23/21 0200  ceFEPIme (MAXIPIME) 2 g in sodium chloride 0.9 % 100 mL IVPB  Status:  Discontinued        2 g 200 mL/hr over 30 Minutes Intravenous Every 12 hours 10/22/21 2032 10/24/21 0747   10/22/21 2200  cefOXitin (MEFOXIN) 2 g in sodium chloride 0.9 % 100 mL IVPB  Status:  Discontinued        2 g 200 mL/hr over 30 Minutes Intravenous Every 8 hours 10/22/21 1706 10/22/21 2032   10/22/21 1956  vancomycin variable dose per unstable renal function (pharmacist dosing)  Status:  Discontinued         Does not apply See admin instructions 10/22/21 1957 10/23/21 1917   10/22/21 1245  vancomycin (VANCOCIN) IVPB 1000 mg/200 mL premix        1,000 mg 200 mL/hr over 60 Minutes Intravenous  Once 10/22/21 1241 10/22/21 1402   10/22/21 1245  ceFEPIme (MAXIPIME) 2 g in sodium chloride 0.9 % 100 mL IVPB        2 g 200 mL/hr over 30 Minutes Intravenous  Once 10/22/21 1241 10/22/21 1321   10/22/21 1230  metroNIDAZOLE (FLAGYL) IVPB 500 mg  Status:  Discontinued        500 mg 100 mL/hr over 60 Minutes Intravenous Every 12 hours 10/22/21 1222 10/24/21 1357        Subjective: Patient seen and examined at bedside.  Poor historian.  No fever, abdominal pain, vomiting reported.   Objective: Vitals:   12/05/21 0558 12/05/21 1457 12/05/21 2019 12/06/21 0531  BP: (!) 147/84 116/68 136/79 124/71  Pulse: 84 74 81 77  Resp: '17 17 18 16  '$ Temp: 98.5 F (36.9 C) 98.4 F (36.9 C) 98.7 F (37.1 C) 98.7 F (37.1 C)  TempSrc: Oral Oral Oral Oral  SpO2: 100% 100% 99% 99%  Weight:      Height:        Intake/Output Summary (Last 24 hours) at 12/06/2021 1124 Last data filed at 12/06/2021 1122 Gross per 24 hour  Intake 1195.21 ml  Output 2700 ml  Net -1504.79 ml    Filed Weights   11/21/21 0500 11/27/21 0500 11/28/21 0500  Weight: 60.8 kg 70.8 kg 65 kg    Examination:  General: No acute distress.  Currently on room air.  Chronically ill and deconditioned  looking.   ENT/neck: No elevated JVD.  No palpable neck masses  respiratory: Bilateral decreased breath sounds at bases, no wheezing  CVS: S1 and S2 are heard; currently rate is controlled abdominal: Soft, mild diffuse tenderness present; no rebound tenderness; distended mildly; no organomegaly, normal bowel sounds are heard.  Ostomy and Eakin's pouch present Extremities: No cyanosis; mild lower extremity edema presents  CNS: Awake, answers some questions.  No focal neurologic deficit.  Moving extremities  lymph: No obvious palpable lymphadenopathy skin: No obvious ecchymosis/lesions  psych: Flat affect.  No signs of agitation currently  musculoskeletal: No obvious joint swelling/deformity   Data Reviewed: I have personally reviewed following labs and imaging studies  CBC: Recent Labs  Lab 12/01/21 0500 12/02/21 0222 12/03/21 0414 12/04/21 0325 12/06/21 0542  WBC 7.5 7.9 6.8 6.8 6.7  HGB 7.9* 7.6* 7.7* 8.0* 8.0*  HCT 23.6* 23.4* 22.8* 24.2* 23.4*  MCV 89.7 90.3 88.7 90.0 88.3  PLT 246 266 267 294 654    Basic Metabolic Panel: Recent Labs  Lab 11/30/21 0500 12/01/21 0500 12/02/21 0222 12/03/21 0414 12/04/21 0325 12/05/21 0313 12/06/21 0542  NA 129* 127* 127* 125* 125* 127* 127*  K 4.5 4.9 4.6 4.2 3.9 4.0 3.8  CL 105 103 105 102 101 103 101  CO2 16* 17* 17* 17* 16* 18* 18*  GLUCOSE 101* 107* 97 101* 103* 104* 98  BUN 31* 34* 30* 25* 22* 19 16  CREATININE 0.73 0.95 0.80 0.72 0.81 0.74 0.80  CALCIUM 8.2* 8.0* 8.0* 8.0* 7.9* 8.0* 8.0*  MG 1.6* 2.2 1.8  --   --   --   --   PHOS 4.5 4.3  --   --   --   --   --     GFR: Estimated Creatinine Clearance: 76.7 mL/min (by C-G formula based on SCr of 0.8 mg/dL). Liver Function Tests: Recent Labs  Lab 11/30/21 0500 12/01/21 0500 12/06/21 0542  AST '19 20 19  '$ ALT '28 29 23  '$ ALKPHOS 121 104 96  BILITOT 0.4 0.5 0.5  PROT 6.3* 6.2* 6.4*  ALBUMIN 2.1* 2.1* 2.1*    No results for input(s): LIPASE, AMYLASE in the last 168  hours. No results for input(s): AMMONIA in the last 168 hours. Coagulation Profile: No results for input(s): INR, PROTIME in the last 168 hours. Cardiac Enzymes: No results  for input(s): CKTOTAL, CKMB, CKMBINDEX, TROPONINI in the last 168 hours. BNP (last 3 results) No results for input(s): PROBNP in the last 8760 hours. HbA1C: No results for input(s): HGBA1C in the last 72 hours. CBG: Recent Labs  Lab 11/30/21 1205 11/30/21 1712 11/30/21 2332 12/01/21 0618 12/01/21 1237  GLUCAP 106* 129* 114* 107* 106*    Lipid Profile: No results for input(s): CHOL, HDL, LDLCALC, TRIG, CHOLHDL, LDLDIRECT in the last 72 hours.  Thyroid Function Tests: No results for input(s): TSH, T4TOTAL, FREET4, T3FREE, THYROIDAB in the last 72 hours. Anemia Panel: No results for input(s): VITAMINB12, FOLATE, FERRITIN, TIBC, IRON, RETICCTPCT in the last 72 hours. Sepsis Labs: No results for input(s): PROCALCITON, LATICACIDVEN in the last 168 hours.  Recent Results (from the past 240 hour(s))  Culture, blood (Routine X 2) w Reflex to ID Panel     Status: None   Collection Time: 11/29/21  4:14 PM   Specimen: BLOOD  Result Value Ref Range Status   Specimen Description   Final    BLOOD RIGHT ANTECUBITAL Performed at [facility] [address], [place], [place] [address]    Special Requests   Final    CORRECTED RESULTS AEROBIC BOTTLE ONLY PREVIOUSLY REPORTED AS: AEROCOCCUS SPECIES CORRECTED RESULTS CALLED TO: PHARMD ELIZABETH [phone] FCP   Culture   Final    NO GROWTH 5 DAYS Performed at [facility], [place] [address], [place], [place] [address]    Report Status 12/04/2021 FINAL  Final  Culture, blood (Routine X 2) w Reflex to ID Panel     Status: None   Collection Time: 11/29/21  4:19 PM   Specimen: BLOOD  Result Value Ref Range Status   Specimen Description   Final    BLOOD RIGHT ANTECUBITAL LOWER Performed at [facility] [address],  [place], [place] [address]    Special Requests   Final    BOTTLES DRAWN AEROBIC ONLY Blood Culture adequate volume Performed at [facility] [address], [place], [place] [address]    Culture   Final    NO GROWTH 5 DAYS Performed at [facility], [place] [address] [place], [place] [address]    Report Status 12/04/2021 FINAL  Final  Urine Culture     Status: Abnormal   Collection Time: 11/29/21  6:35 PM   Specimen: Urine, Clean Catch  Result Value Ref Range Status   Specimen Description   Final    URINE, CLEAN CATCH Performed at [facility], [place] [name], [place], [place] [address]    Special Requests   Final    NONE Performed at [facility], [place] [address] [place], [place] [address]    Culture (A)  Final    <10,000 COLONIES/mL INSIGNIFICANT GROWTH Performed at [facility] [address], [place], [place] [address]    Report Status 11/30/2021 FINAL  Final          Radiology Studies: [place] EKG Site Rite  Result Date: 12/06/2021 If Site Rite image not attached, placement could not be confirmed due to current cardiac rhythm.       Scheduled Meds:  vitamin C  500 mg Oral BID   Chlorhexidine Gluconate Cloth  6 each Topical Daily   enoxaparin (LOVENOX) injection  40 mg Subcutaneous Q24H   feeding supplement  237 mL Per Tube BID BM   ferrous sulfate  325 mg Oral BID WC  gabapentin  300 mg Oral QHS   lip balm  1 application. Topical BID   loperamide  4 mg Oral QHS   mouth rinse  15 mL Mouth Rinse q12n4p   metoprolol tartrate  50 mg Oral BID   nicotine  14 mg Transdermal Daily   pantoprazole  40 mg Oral Daily   polycarbophil  625 mg Oral BID   sodium chloride flush  10-40 mL Intracatheter Q12H   vitamin A  10,000 Units Oral Daily   Continuous Infusions:  sodium chloride 250 mL (12/05/21 2140)   methocarbamol (ROBAXIN) IV     piperacillin-tazobactam (ZOSYN)  IV 3.375 g (12/06/21 0845)           Aline August, MD [facility] 12/06/2021, 11:24 AM

## 2021-12-06 NOTE — Progress Notes (Signed)
Physical Therapy Treatment Patient Details Name: Jessica [name] MRN: [id number] DOB: 05-23-1961 Today's Date: 12/06/2021   History of Present Illness Jessica [name] is an 61 y.o. female past medical history significant for alcohol abuse, with admissions for severe alcohol withdrawals requiring ICU admissions tobacco use presented to the ED on 10/22/2021 diffuse abdominal pain that started the day prior to admission.  CT of the abdomen pelvis showed pneumoperitoneum and free fluid, severe sepsis with started on broad-spectrum antibiotics fluid resuscitated surgery was consulted was taken emergently to the OR underwent exploratory laparotomy and was found to have a perforated sigmoid colon, status post Hartmann procedure and insertion of the G-tube which was left to drain to gravity, JP in pelvis was noted to have feculent peritonitis in the pelvis.  ICU course was complicated by Klebsiella bacteremia profound caloric protein malnutrition requiring TPN, acute respiratory failure with hypoxia and tachycardia with failure to progress. patient was transitioned back to ICU on 5/24 with fever.    PT Comments    Pt making excellent progress. Reviewed areas as noted below including review of stairs with pt and  Jessica [name] (caregiver). Pt demonstrates increasing independence and confidence with mobility.   I think she will be fine to go home with use of RW and caregiver support  without f/u PT (has no insurance, therefore it is unlikely she will have [facility] services available).   Will continue to follow in acute setting. I have again spoken to the pt about staying off the [place] (at the very least during the day) Hopefully nursing can comply with this.   Recommendations for follow up therapy are one component of a multi-disciplinary discharge planning process, led by the attending physician.  Recommendations may be updated based on patient status, additional functional criteria and insurance authorization.  Follow  Up Recommendations  Home health PT--no ins     Assistance Recommended at Discharge Intermittent Supervision/Assistance  Patient can return home with the following Assistance with cooking/housework;Help with stairs or ramp for entrance;A little help with bathing/dressing/bathroom   Equipment Recommendations  None recommended by PT (has RW)    Recommendations for Other Services       Precautions / Restrictions Precautions Precautions: Fall Precaution Comments: multiple lines--colostomy,gastrostomy tube, triple lumen PICC, colostomy Restrictions Weight Bearing Restrictions: No Other Position/Activity Restrictions: recent R UE/shoulder fx-- per chart review RUE WB status expired on 10/27/21.     Mobility  Bed Mobility Overal bed mobility: Needs Assistance Bed Mobility: Supine to Sit Rolling: Modified independent (Device/Increase time) Sidelying to sit: Modified independent (Device/Increase time), HOB elevated       General bed mobility comments: incr time, use of rail, no physical assist    Transfers Overall transfer level: Needs assistance Equipment used: Rolling walker (2 wheels) Transfers: Sit to/from Stand Sit to Stand: Supervision           General transfer comment: cues to power up from bed with  LEs. incr time, 2 attempts; varied ht surfaces, discussed adding blankets or cushions to sofa/chair at home to ease STS transitions    Ambulation/Gait Ambulation/Gait assistance: Supervision Gait Distance (Feet): 240 Feet Assistive device: Rolling walker (2 wheels) Gait Pattern/deviations: Step-through pattern, Decreased stride length, Trunk flexed       General Gait Details: cues for posture, breathing, safety with turns; no LOB includingmaneuvering through tighter spaces in room and avoiding obstacles in hallway   Stairs Stairs: Yes Stairs assistance: Min guard Stair Management: Step to pattern, With walker, Forwards Number of Stairs:  3 (x2) General stair  comments: cues for technique and sequence. pt expresses incr confidence after practice. min/guard for safety; Jessica [name] (caregiver) and pt able to return demo safely   Wheelchair Mobility    Modified Rankin (Stroke Patients Only)       Balance   Sitting-balance support: No upper extremity supported, Feet supported Sitting balance-Leahy Scale: Good       Standing balance-Leahy Scale: Fair Standing balance comment: pt able to maintain static standing without UE support, reliant on UEs for dynamic tasks                            Cognition Arousal/Alertness: Awake/alert Behavior During Therapy: WFL for tasks assessed/performed Overall Cognitive Status: Within Functional Limits for tasks assessed                                          Exercises      General Comments        Pertinent Vitals/Pain Pain Assessment Pain Assessment: No/denies pain    Home Living                          Prior Function            PT Goals (current goals can now be found in the care plan section) Acute Rehab PT Goals PT Goal Formulation: With patient Time For Goal Achievement: 12/02/21 Potential to Achieve Goals: Good Progress towards PT goals: Progressing toward goals    Frequency    Min 3X/week      PT Plan Current plan remains appropriate    Co-evaluation              AM-PAC PT "6 Clicks" Mobility   Outcome Measure  Help needed turning from your back to your side while in a flat bed without using bedrails?: None Help needed moving from lying on your back to sitting on the side of a flat bed without using bedrails?: A Little Help needed moving to and from a bed to a chair (including a wheelchair)?: None Help needed standing up from a chair using your arms (e.g., wheelchair or bedside chair)?: None Help needed to walk in hospital room?: A Little Help needed climbing 3-5 steps with a railing? : A Little 6 Click Score: 21    End  of Session Equipment Utilized During Treatment: Gait belt Activity Tolerance: Patient tolerated treatment well Patient left: in bed;with call bell/phone within reach;with bed alarm set Nurse Communication: Mobility status PT Visit Diagnosis: Muscle weakness (generalized) (M62.81);History of falling (Z91.81);Difficulty in walking, not elsewhere classified (R26.2);Pain Pain - Right/Left: Right Pain - part of body: Shoulder     Time: 2751-7001 PT Time Calculation (min) (ACUTE ONLY): 23 min  Charges:  $Gait Training: 23-37 mins                     Baxter Flattery, PT  Acute Rehab Dept ([place]) [phone] Pager [phone]  12/06/2021    [facility] 12/06/2021, 11:57 AM

## 2021-12-06 NOTE — Discharge Summary (Addendum)
Physician Discharge Summary  JOSEFITA WEISSMANN EPP:[id number] DOB: [birth year] DOA: 10/22/2021  PCP: Deland Pretty, MD  Admit date: 10/22/2021 Discharge date: 12/06/2021  Admitted From: Home Disposition: Home  Recommendations for Outpatient Follow-up:  Follow up with PCP in 1 week with repeat CBC/BMP Outpatient follow-up with general surgery.  Wound care as per general surgery/wound care recommendations Outpatient follow-up with ID Follow up in ED if symptoms worsen or new appear   Home Health: No Equipment/Devices: None  Discharge Condition: Stable CODE STATUS: Full Diet recommendation: Heart healthy  Brief/Interim Summary: 61 year old female with history of alcohol abuse, several alcohol withdrawals requiring admissions, tobacco use presented on 10/22/2021 with diffuse abdominal pain with CT of abdomen and pelvis showed pneumoperitoneum and free fluid.  She was started on broad-spectrum antibiotics and emergently taken to the OR and underwent exploratory laparotomy where she was found to have a perforated sigmoid colon.  She is status post Hartmann procedure and insertion of G-tube.  She was found to have Klebsiella bacteremia.  Hospital course complicated by development of left lower quadrant fluid collection status post IR drainage on 10/29/2021, drain cultures grew Pseudomonas.  This required having her drain adjusted and upsized on 510, fluid appeared feculent.  Drain was eventually removed.  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  She had a repeat CT on 11/10/2021 with pelvic fluid collection measuring 6 x 3.2 cm.  This was discussed with IR and it was not felt safe or amenable to drainage.  She was continued on IV antibiotics.  ID was consulted.  Repeat CT scan showed stable pelvic abscess also remaining unable to be drained.  She was briefly on TPN but currently has been weaned off and she is eating.  Palliative care was also  consulted for goals of care discussion.  Currently remains full code.  Currently, plans are to continue IV Zosyn for at least the next 4 weeks with a repeat CT scan in the next 2 to 3 weeks.  General surgery has cleared the patient for discharge.  She will be discharged home today with outpatient follow-up with PCP/ID/general surgery.  Discharge Diagnoses:   Complicated intra-abdominal infection/Pseudomonas intra-abdominal abscess Perforated sigmoid colon status post expiratory laparotomy/Hartman's procedure/G-tube placement Septic shock: Present on admission, resolved Klebsiella bacteremia [facility] course as above. -She was briefly on TPN but currently has been weaned off and she is eating. -General surgery following.   -Currently on Zosyn IV as per ID recommendations: ID recommends to continue IV Zosyn 13.5 g continuous infusion every 24 hours with end date of 12/30/2021 -Septic shock has already resolved.  Off pressors -Currently afebrile and hemodynamically stable.  Blood cultures from 11/29/2021 are negative so far -TOC working on setting up safe home discharge with supplies and home health RN -General surgery has cleared the patient for discharge.  She will be discharged home today with outpatient follow-up with PCP/ID/general surgery.   Acute respiratory failure with hypoxia/aspiration pneumonia -Has required intubation and extubation twice.  Extubated again on 11/08/2021.  Currently on room air.   Acute metabolic encephalopathy -Multifactorial due to ICU delirium/infectious etiology/alcohol withdrawal -Mental status has mostly improved.  Monitor mental status. -Fall precautions   AKI -Resolved.   Essential hypertension -Monitor blood pressure.  Continue metoprolol.   Anemia of chronic disease -Has required 4 units packed red cells transfusion during the hospitalization with last transfusion being on 11/24/2021 for hemoglobin of 6.9. -Hemoglobin 8 today.  Outpatient follow-up  Hyponatremia -Possibly from increased water intake. -Sodium 127 again today.  Improving. -Was on salt tablets but patient did not like it, now discontinued.  Also received several days of IV Lasix. -Continue fluid restriction.  Outpatient follow-up     Severe protein calorie malnutrition/hypoalbuminemia Anorexia -Nutrition following.   Hypomagnesemia -Resolved   Substance-induced mood disorder/anxiety -Outpatient follow-up   Physical deconditioning -Overall mobility improving as per PT  Discharge Instructions  Discharge Instructions     [facility] pharmacist to adjust dose for Vancomycin, Aminoglycosides and other anti-infective therapies as requested by physician.   Complete by: As directed    [facility] to provide Cath Flo 67m   Complete by: As directed    Administer for PICC line occlusion and as ordered by physician for other access device issues.   Amb Referral to Ostomy Clinic   Complete by: As directed    Reason for referral modifiers:  Pre and post-operative counseling for ostomy management Counseling, education and care for feeding/drainage tubes or fistulae     Ambulatory referral to Infectious Disease   Complete by: As directed    Anaphylaxis Kit: Provided to treat any anaphylactic reaction to the medication being provided to the patient if First Dose or when requested by physician   Complete by: As directed    Epinephrine 170mml vial / amp: Administer 0.59m38m0.59ml35mubcutaneously once for moderate to severe anaphylaxis, nurse to call physician and pharmacy when reaction occurs and call 911 if needed for immediate care   Diphenhydramine 50mg30mIV vial: Administer 25-50mg 44mM PRN for first dose reaction, rash, itching, mild reaction, nurse to call physician and pharmacy when reaction occurs   Sodium Chloride 0.9% NS 500ml I61mdminister if needed for hypovolemic blood pressure drop or as ordered by physician after call to physician with  anaphylactic reaction   Change dressing on IV access line weekly and PRN   Complete by: As directed    Diet - low sodium heart healthy   Complete by: As directed    Discharge wound care:   Complete by: As directed    As per general surgery recommendations   Flush IV access with Sodium Chloride 0.9% and Heparin 10 units/ml or 100 units/ml   Complete by: As directed    Home infusion instructions - [facility]   Complete by: As directed    Instructions: Flush IV access with Sodium Chloride 0.9% and Heparin 10units/ml or 100units/ml   Change dressing on IV access line: Weekly and PRN   Instructions Cath Flo 2mg: Ad74mister for PICC Line occlusion and as ordered by physician for other access device   [facility] pharmacist to adjust dose for: Vancomycin, Aminoglycosides and other anti-infective therapies as requested by physician   Increase activity slowly   Complete by: As directed    Method of administration may be changed at the discretion of home infusion pharmacist based upon assessment of the patient and/or caregiver's ability to self-administer the medication ordered   Complete by: As directed       Allergies as of 12/06/2021   No Known Allergies      Medication List     STOP taking these medications    cetirizine 10 MG tablet Commonly known as: ZYRTEC   diclofenac Sodium 1 % Gel Commonly known as: VOLTAREN   mirtazapine 15 MG disintegrating tablet Commonly known as: REMERON SOL-TAB   morphine 15 MG tablet Commonly known as: MSIR       TAKE  these medications    acetaminophen 325 MG tablet Commonly known as: TYLENOL Take 2 tablets (650 mg total) by mouth every 6 (six) hours as needed for moderate pain, fever, headache or mild pain.   ascorbic acid 500 MG tablet Commonly known as: VITAMIN C Take 1 tablet (500 mg total) by mouth 2 (two) times daily.   ferrous sulfate 325 (65 FE) MG tablet Take 1 tablet (325 mg total) by mouth 2 (two)  times daily with a meal.   fluticasone 50 MCG/ACT nasal spray Commonly known as: FLONASE Place 1-2 sprays into both nostrils daily as needed for allergies or rhinitis.   gabapentin 300 MG capsule Commonly known as: Neurontin Take 1 capsule (300 mg total) by mouth at bedtime.   loperamide 2 MG capsule Commonly known as: IMODIUM Take 2 capsules (4 mg total) by mouth 2 (two) times daily.   loratadine 10 MG tablet Commonly known as: CLARITIN Take 10 mg by mouth daily as needed for allergies.   methocarbamol 500 MG tablet Commonly known as: ROBAXIN Take 1-2 tablets (500-1,000 mg total) by mouth every 8 (eight) hours as needed for muscle spasms.   metoprolol tartrate 50 MG tablet Commonly known as: LOPRESSOR Take 1 tablet (50 mg total) by mouth 2 (two) times daily.   Multivitamin Adults 50+ Tabs Take 1 tablet by mouth every morning.   ondansetron 8 MG tablet Commonly known as: ZOFRAN Take 8 mg by mouth daily as needed for nausea/vomiting.   oxyCODONE 5 MG immediate release tablet Commonly known as: Roxicodone Take 1-2 tablets (5-10 mg total) by mouth every 4 (four) hours as needed.   piperacillin-tazobactam  IVPB Commonly known as: ZOSYN Inject 13.5 g into the vein daily. Indication:  Intra-abdominal abscesses First Dose: Yes Last Day of Therapy:  12/30/21 Labs - Once weekly:  CBC/D and BMP, Labs - Every other week:  ESR and CRP Method of administration: Elastomeric (Continuous infusion) Method of administration may be changed at the discretion of home infusion pharmacist based upon assessment of the patient and/or caregiver's ability to self-administer the medication ordered.   polycarbophil 625 MG tablet Commonly known as: FIBERCON Take 1 tablet (625 mg total) by mouth 2 (two) times daily.               Durable Medical Equipment  (From admission, onward)           Start     Ordered   12/05/21 1539  For home use only DME Bedside commode  Once        Question:  Patient needs a bedside commode to treat with the following condition  Answer:  Weakness   12/05/21 1539              Discharge Care Instructions  (From admission, onward)           Start     Ordered   12/06/21 0000  Change dressing on IV access line weekly and PRN  (Home infusion instructions - [facility] )        12/06/21 1224   12/06/21 0000  Discharge wound care:       Comments: As per general surgery recommendations   12/06/21 1247              Follow-up Information     Leighton Ruff, MD Follow up on 01/02/2022.   Specialties: General Surgery, Colon and Rectal Surgery Why: 130pm. Please arrive 30 minutes prior to your appointment for paperwork. Please bring  a copy of your photo ID and insurance card. Contact information: [place] [address] [phone]         Deland Pretty, MD. Schedule an appointment as soon as possible for a visit in 1 week(s).   Specialty: Internal Medicine Contact information: [place] [address] [phone]                No Known Allergies  Consultations:  General surgery/palliative care/ID/PCCM/IR   Procedures/Studies: CT ABDOMEN PELVIS W CONTRAST  Result Date: 11/29/2021 CLINICAL DATA:  Abdominal pain. 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 Evaluate for undrained abscess. EXAM: CT ABDOMEN AND PELVIS WITH CONTRAST TECHNIQUE: Multidetector CT imaging of the abdomen and pelvis was performed using the standard protocol following bolus administration of intravenous contrast. RADIATION DOSE REDUCTION: This exam was performed according to the departmental dose-optimization program which includes automated exposure control, adjustment of the mA and/or kV according to patient size and/or use of iterative reconstruction technique. CONTRAST:  56m OMNIPAQUE IOHEXOL 300 MG/ML  SOLN COMPARISON:   11/23/2021 FINDINGS: Lower chest: Unchanged appearance of moderate left pleural effusion. The small right pleural effusion has resolved. Hepatobiliary: Gallstones identified measuring up to 8 mm. No gallbladder wall thickening or bile duct dilatation. No focal liver abnormality. Pancreas: Unremarkable. No pancreatic ductal dilatation or surrounding inflammatory changes. Spleen: Normal in size without focal abnormality. Adrenals/Urinary Tract: The adrenal glands appear normal. No nephrolithiasis, hydronephrosis or kidney mass identified. Diffuse distension of the urinary bladder is again noted. No focal bladder abnormality. Stomach/Bowel: There is a gastrostomy tube within the stomach. 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 Again noted are multiple mildly dilated loops of small bowel with multiple air-fluid levels. These measure up to 3.4 cm in diameter, similar to previous exam. Enteric contrast material is identified within small bowel loops as well as the cecum. Dilute enteric contrast material is noted within the colon up to the level of the colostomy. Vascular/Lymphatic: Aortic atherosclerosis. There is no aneurysm. No abdominopelvic adenopathy. Reproductive: No suspicious mass. Other: There is a percutaneous drainage catheter which enters the ventral pelvis from a left lower quadrant approach, image 61/2. This is unchanged in position from the previous exam the previously identified fluid collection in this area has resolved. -fluid collection in the posterior pelvis measures 3.9 x 2.2 by 2.8 cm (volume = 13 cm^3), image 67/2. This is compared with 4.9 x 2.8 by 3.7 cm (volume = 27 cm^3)previously. -peripherally enhancing fluid collection within the presacral soft tissue space measures 3.4 by 1.3 by 2.8 cm (volume = 6.5 cm^3), image 62/2. Previously this measured 3.5 x 1.3 by 2.7 cm (volume = 6.4 cm^3). No new fluid collections identified. Musculoskeletal: No acute or significant  osseous findings. IMPRESSION: 1. Unchanged position of left-sided pigtail catheter. Previously identified fluid collection in this region is resolved. 2. Fluid collection within the posterior pelvis has decreased in volume from previous exam. The second, smaller presacral fluid collection is unchanged. 3. Unchanged appearance of moderate left pleural effusion with resolution of small right pleural effusion. 4. Similar appearance of diffusely increased caliber of the small bowel loops which may represent partial obstruction versus ileus. 5. Cholelithiasis. 6. Aortic Atherosclerosis (ICD10-I70.0). Electronically Signed   By: TKerby MoorsM.D.   On: 11/29/2021 10:25   CT ABDOMEN PELVIS W CONTRAST  Result Date: 11/23/2021 CLINICAL DATA:  Intra-abdominal abscess, tachycardia and fever. Known leak. EXAM: CT ABDOMEN AND  PELVIS WITH CONTRAST TECHNIQUE: Multidetector CT imaging of the abdomen and pelvis was performed using the standard protocol following bolus administration of intravenous contrast. RADIATION DOSE REDUCTION: This exam was performed according to the departmental dose-optimization program which includes automated exposure control, adjustment of the mA and/or kV according to patient size and/or use of iterative reconstruction technique. CONTRAST:  185m OMNIPAQUE IOHEXOL 300 MG/ML  SOLN COMPARISON:  CT abdomen and pelvis 11/17/2021 FINDINGS: Lower chest: Small right and moderate left pleural effusions are present. These have increased from prior. There is left lower lobe atelectasis. Hepatobiliary: Gallstones are again seen. There is mild gallbladder wall edema, unchanged. There is no biliary ductal dilatation. No focal liver lesion identified. Pancreas: Unremarkable. No pancreatic ductal dilatation or surrounding inflammatory changes. Spleen: Normal in size without focal abnormality. Adrenals/Urinary Tract: Adrenal glands are unremarkable. Kidneys are normal, without renal calculi, focal lesion, or  hydronephrosis. Bladder is unremarkable. 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 Colon appears nondilated. Percutaneous gastrostomy tube is in the body of the stomach. The stomach is nondilated. There are numerous small bowel loops are mildly dilated with air-fluid levels. These contain contrast. No definitive transition point visualized, but contrast is seen to the level of the distal small bowel. No significant contrast seen throughout the colon. No free air or pneumatosis identified. Appendix is not seen. Vascular/Lymphatic: Aortic atherosclerosis. No enlarged abdominal or pelvic lymph nodes. Reproductive: Prior hysterectomy. Other: Left pelvic percutaneous drainage catheter remains unchanged in position. Previously identified fluid collection in this area has resolved. Enhancing fluid collection in the posterior pelvis has decreased in size now measuring 2.8 x 5.2 by 3.8 cm (previously 6.4 by 3.2 by 4.6 cm). Enhancing fluid collection in the presacral space has not significantly changed measuring 3.5 x 1.7 x 1.4 cm image 2/58. Enhancing fluid collection along the left pelvic sidewall has not significantly changed measuring 1.6 x 3.1 by 2.3 cm. No new fluid collections are identified. No ascites. Body wall edema has increased. Midline abdominal wound is again noted. Musculoskeletal: L2 compression deformity is stable. There stable severe degenerative changes at L3-L4. IMPRESSION: 1. Left-sided pigtail catheter in place. Previously identified fluid collection in this region has resolved. 2. Central pelvic enhancing fluid collection has decreased in size. Other pelvic fluid collections are stable. 3. Diffusely dilated small bowel containing contrast worrisome for small bowel ileus or partial distal small bowel obstruction. 4. Increasing body wall edema. 5. Again seen is a left lower quadrant colostomy. 6. Increasing bilateral pleural effusions. 7. Cholelithiasis with stable gallbladder wall  edema. Correlate clinically for cholecystitis. 8.  Aortic Atherosclerosis (ICD10-I70.0). Electronically Signed   By: ARonney AstersM.D.   On: 11/23/2021 16:25   CT ABDOMEN PELVIS W CONTRAST  Result Date: 11/17/2021 CLINICAL DATA:  Intraop abscess EXAM: CT ABDOMEN AND PELVIS WITH CONTRAST TECHNIQUE: Multidetector CT imaging of the abdomen and pelvis was performed using the standard protocol following bolus administration of intravenous contrast. RADIATION DOSE REDUCTION: This exam was performed according to the departmental dose-optimization program which includes automated exposure control, adjustment of the mA and/or kV according to patient size and/or use of iterative reconstruction technique. CONTRAST:  76mOMNIPAQUE IOHEXOL 300 MG/ML  SOLN COMPARISON:  CT abdomen and pelvis dated Nov 10, 2021 FINDINGS: Lower chest: Small left pleural effusion with atelectasis. Linear opacity of the lingula with associated traction bronchiectasis, likely sequela of prior infection. Hepatobiliary: No focal liver abnormality is seen. Cholelithiasis. Unchanged gallbladder mucosal enhancement with no evidence of gallbladder distension. No biliary  ductal dilation. Pancreas: Unremarkable. No pancreatic ductal dilatation or surrounding inflammatory changes. Spleen: Normal in size without focal abnormality. Adrenals/Urinary Tract: Adrenal glands are unremarkable. Kidneys are normal, without renal calculi, focal lesion, or hydronephrosis. Mild wall thickening of the urinary bladder which is likely reactive. Stomach/Bowel: Gastrostomy tube is seen in the body of the stomach. 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 Numerous matted loops of small bowel seen in the pelvis with adjacent rim enhancing pelvic fluid collection measuring 6.5 x 3.1 cm on series 3, image 64, unchanged in size when compared with prior exam. More anteriorly located fluid collection which contains a pigtail catheter located on series 3, image 87  unchanged in size when compared with prior exam, measuring approximately 0.8 x 1.6 cm, unchanged when compared with prior exam and remeasured in similar plane. Small collection along the left pelvic sidewall measures up to 2.5 cm on series 3, image 48, previously 2.5 cm. Vascular/Lymphatic: Aortic atherosclerosis. No enlarged abdominal or pelvic lymph nodes. Reproductive: Prior hysterectomy. Other: No ascites or pneumoperitoneum. Stable appearance of midline wound, compatible with history of colocutaneous fistula. Musculoskeletal: Unchanged severe compression deformity of L1. Moderate to severe degenerative disc disease at L3-L4. No aggressive appearing osseous lesions. IMPRESSION: 1. Rim enhancing fluid collections of the pelvis are unchanged in size when compared with prior exam, more anteriorly located collection contains a pigtail drainage catheter. 2. 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 Numerous matted and thick-walled loops of large and small bowel small bowel seen in the pelvis, similar to prior exam. 3. Small left pleural effusion. 4. Cholelithiasis. Electronically Signed   By: Yetta Glassman M.D.   On: 11/17/2021 12:38   CT ABDOMEN PELVIS W CONTRAST  Result Date: 11/10/2021 CLINICAL DATA:  Abdominal pain, postop. Evaluate for fluid collection. Contrast via stoma to evaluate for possible colocutaneous fistula. EXAM: CT ABDOMEN AND PELVIS WITH CONTRAST TECHNIQUE: Multidetector CT imaging of the abdomen and pelvis was performed using the standard protocol following bolus administration of intravenous contrast. RADIATION DOSE REDUCTION: This exam was performed according to the departmental dose-optimization program which includes automated exposure control, adjustment of the mA and/or kV according to patient size and/or use of iterative reconstruction technique. CONTRAST:  80m OMNIPAQUE IOHEXOL 300 MG/ML  SOLN COMPARISON:  11/04/2021 FINDINGS: Lower chest: Small left pleural effusion with  lower lobe opacity and volume loss. Hepatobiliary: No focal liver abnormality.Cholelithiasis. Prominent gallbladder mucosal enhancement that is diffuse. No over distension or pericholecystic inflammation. Pancreas: Unremarkable. Spleen: Unremarkable. Adrenals/Urinary Tract: Negative adrenals. No hydronephrosis or stone. Unremarkable bladder. Stomach/Bowel: Descending colostomy which was reportedly injected. Contrast opacifies the upstream colon and a ventral pelvic collection which contains the pigtail catheter, collection being approximately 7 cm in diameter and 1.3 cm in thickness. Visible fistula between the collection and the skin. Matting of bowel loops in the pelvis. No contrast opacifies the more posterior pelvic collection which measures up to 6 x 3.2 cm on axial images, similar to prior. There is a tubular area of rim enhancing fluid in the presacral region which is in retrospect stable from prior noncontrast CT. Vascular/Lymphatic: No acute vascular abnormality. Reproductive:No acute finding Other: No ascites or pneumoperitoneum. Musculoskeletal: No acute abnormalities. Advanced L2 height loss from old compression fracture. 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 IMPRESSION: 1. History of ostomy injection with distal colon opacification that is contiguous with the ventral collection and midline wound- a colocutaneous fistula. The percutaneous catheter resides within this collection. 2. Separate more  posterior and deep pelvic collections, the larger measuring 6 x 3.2 cm. These do not opacify. 3. Small left pleural effusion with lower lobe atelectasis. Left lower lobe aeration is improved from prior CT. 4. Cholelithiasis. Electronically Signed   By: Jorje Guild M.D.   On: 11/10/2021 12:25   IR Catheter Tube Change  Result Date: 11/16/2021 CLINICAL DATA:  History of colonic resection, post percutaneous drainage catheter placement 10/29/2021. EXAM: IR  CATHETER TUBE CHANGE COMPARISON:  CT abdomen pelvis-11/10/2021; 11/04/2021; 10/28/2021 CT-guided percutaneous catheter placement-10/29/2021 CONTRAST:  10 cc Omnipaque 300-administered via the percutaneous drainage catheter. MEDICATIONS: None. ANESTHESIA/SEDATION: None FLUOROSCOPY TIME:  1 minute, 30 seconds (3 mGy) TECHNIQUE: Patient was positioned supine on the fluoroscopy table. The external portion of the existing percutaneous drainage catheter as well as the surrounding skin was prepped and draped in usual sterile fashion. A preprocedural spot fluoroscopic image was obtained of the existing percutaneous drainage catheter. A small amount of contrast was injected via the existing percutaneous drainage catheter and several fluoroscopic images were obtained in various obliquities. The external portion of the percutaneous drainage catheter was cut and cannulated with a short Amplatz wire. Under intermittent fluoroscopic guidance, the existing percutaneous drainage catheter was exchanged for a new slightly larger now 45 [place] percutaneous drainage catheter which was retracted in more ideally positioned with end coiled and locked within the residual abscess cavity. Contrast injection confirmed appropriate position functionality of the percutaneous drainage catheter. The percutaneous drainage catheter was connected to a gravity bag and secured in place within interrupted suture and a StatLock device. A dressing was applied. The patient tolerated the procedure well without immediate postprocedural complication. FINDINGS: Preprocedural spot fluoroscopic image demonstrates stable positioning of left lower abdominal percutaneous drainage catheter Contrast injection demonstrates opacification of the residual abscess cavity which appears to demonstrate continues fistulous connection to the open midline abdominal wound. There is no definitive communication between the ventral abdominal/pelvic abscess and the known  collection with the pelvic cul-de-sac. After fluoroscopic guided exchange, repositioning and up sizing, the new slightly larger now 14 French drainage catheter is more ideally positioned within the residual abscess cavity. IMPRESSION: 1. Successful fluoroscopic guided exchange, repositioning and up sizing of now 14 [place] all-purpose drainage catheter with end now more ideally positioned within the dominant component of the residual abscess. 2. 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 3. Residual abscess cavity does not appear to communicate with known collection within the pelvic cul-de-sac. PLAN: - As the existing drainage catheter does not appear to communicate with the known pelvic fluid collection, would recommend CT scan of the abdomen pelvis for further evaluation as indicated. Above discussed with surgical PA, [place], at the time procedure completion. Electronically Signed   By: Sandi Mariscal M.D.   On: 11/16/2021 11:23   DG CHEST PORT 1 VIEW  Result Date: 11/29/2021 CLINICAL DATA:  [id number]; pain EXAM: PORTABLE CHEST 1 VIEW COMPARISON:  Radiograph dated Nov 23, 2021 FINDINGS: The cardiomediastinal silhouette is unchanged in contour.LEFT upper extremity PICC tip with tip terminating over the superior RIGHT atrium, unchanged. No large pleural effusion. No pneumothorax. No acute pleuroparenchymal abnormality. Visualized abdomen is unremarkable. IMPRESSION: No acute cardiopulmonary abnormality. Electronically Signed   By: Valentino Saxon M.D.   On: 11/29/2021 16:35   DG CHEST PORT 1 VIEW  Result Date: 11/23/2021 CLINICAL DATA:  Dyspnea. EXAM: PORTABLE CHEST 1 VIEW COMPARISON:  Nov 07, 2021. FINDINGS: The heart size and mediastinal contours are within normal  limits. Both lungs are clear. Left-sided PICC line is noted with distal tip in expected position of right atrium. Endotracheal tube has been removed. Moderately displaced proximal right  humeral fracture is again noted. IMPRESSION: No acute cardiopulmonary abnormality seen. Electronically Signed   By: Marijo Conception M.D.   On: 11/23/2021 12:47   DG CHEST PORT 1 VIEW  Result Date: 11/07/2021 CLINICAL DATA:  Status post intubation. EXAM: PORTABLE CHEST 1 VIEW COMPARISON:  Nov 07, 2021 (2:28 p.m.) FINDINGS: There is stable left-sided PICC line positioning. Interval endotracheal tube placement is noted with its distal tip approximally 3.7 cm from the carina. Persistent atelectasis is seen within the left lung base, with a small, stable left pleural effusion. The right lung remains clear. No pneumothorax is identified. The heart size and mediastinal contours are within normal limits. The visualized skeletal structures are unremarkable. IMPRESSION: 1. Interval endotracheal tube placement positioning, as described above. 2. Persistent left basilar atelectasis, with a small, stable left pleural effusion. Electronically Signed   By: Virgina [place] M.D.   On: 11/07/2021 19:30   DG Chest Port 1 View  Result Date: 11/07/2021 CLINICAL DATA:  Hypoxia. EXAM: PORTABLE CHEST 1 VIEW COMPARISON:  November 01, 2021. FINDINGS: The heart size and mediastinal contours are within normal limits. Right lung is clear. Mild left basilar atelectasis or infiltrate is noted with associated pleural effusion. Left-sided PICC line is noted with tip in expected position of right atrium. There appears to be the interval development of comminuted right proximal humeral fracture. IMPRESSION: Mild left basilar atelectasis or infiltrate is noted with associated pleural effusion. Probable interval development of comminuted right proximal humeral fracture. Electronically Signed   By: Marijo Conception M.D.   On: 11/07/2021 14:54   [place] EKG Site Rite  Result Date: 12/06/2021 If Site Rite image not attached, placement could not be confirmed due to current cardiac rhythm.     Subjective: Patient seen and examined at bedside.  Poor  historian.  No fever, abdominal pain, vomiting reported.    Discharge Exam: Vitals:   12/06/21 0531 12/06/21 1234  BP: 124/71 (!) 145/74  Pulse: 77 88  Resp: 16 18  Temp: 98.7 F (37.1 C)   SpO2: 99% 100%    General: No acute distress.  Currently on room air.  Chronically ill and deconditioned looking.   ENT/neck: No elevated JVD.  No palpable neck masses  respiratory: Bilateral decreased breath sounds at bases, no wheezing  CVS: S1 and S2 are heard; currently rate is controlled abdominal: Soft, mild diffuse tenderness present; no rebound tenderness; distended mildly; no organomegaly, normal bowel sounds are heard.  Ostomy and Eakin's pouch present Extremities: No cyanosis; mild lower extremity edema presents  CNS: Awake, answers some questions.  No focal neurologic deficit.  Moving extremities  lymph: No obvious palpable lymphadenopathy skin: No obvious ecchymosis/lesions  psych: Flat affect.  No signs of agitation currently  musculoskeletal: No obvious joint swelling/deformity    The results of significant diagnostics from this hospitalization (including imaging, microbiology, ancillary and laboratory) are listed below for reference.     Microbiology: Recent Results (from the past 240 hour(s))  Culture, blood (Routine X 2) w Reflex to ID Panel     Status: None   Collection Time: 11/29/21  4:14 PM   Specimen: BLOOD  Result Value Ref Range Status   Specimen Description   Final    BLOOD RIGHT ANTECUBITAL Performed at [facility] [address] [place], [place]  [address]    Special Requests   Final    CORRECTED RESULTS AEROBIC BOTTLE ONLY PREVIOUSLY REPORTED AS: AEROCOCCUS SPECIES CORRECTED RESULTS CALLED TO: PHARMD ELIZABETH [id number] FCP   Culture   Final    NO GROWTH 5 DAYS Performed at [facility], [place] [address], [place], [place] [address]    Report Status 12/04/2021 FINAL  Final  Culture, blood (Routine X 2) w Reflex to ID Panel      Status: None   Collection Time: 11/29/21  4:19 PM   Specimen: BLOOD  Result Value Ref Range Status   Specimen Description   Final    BLOOD RIGHT ANTECUBITAL LOWER Performed at [facility] [address] [place], [place] [address]    Special Requests   Final    BOTTLES DRAWN AEROBIC ONLY Blood Culture adequate volume Performed at [facility] [address], [place], [place] [address]    Culture   Final    NO GROWTH 5 DAYS Performed at [facility], [place] [address], [place], [place] [address]    Report Status 12/04/2021 FINAL  Final  Urine Culture     Status: Abnormal   Collection Time: 11/29/21  6:35 PM   Specimen: Urine, Clean Catch  Result Value Ref Range Status   Specimen Description   Final    URINE, CLEAN CATCH Performed at [facility], [place] [address], [place], [place] [address]    Special Requests   Final    NONE Performed at [facility], [place] [address] [place], [place] [address]    Culture (A)  Final    <10,000 COLONIES/mL INSIGNIFICANT GROWTH Performed at [facility] [address], [place],  [address]    Report Status 11/30/2021 FINAL  Final     Labs: BNP (last 3 results) Recent Labs    10/30/21 0338 11/01/21 0530 11/07/21 1405  BNP 183.6* 1,640.2* 607.3*   Basic Metabolic Panel: Recent Labs  Lab 11/30/21 0500 12/01/21 0500 12/02/21 0222 12/03/21 0414 12/04/21 0325 12/05/21 0313 12/06/21 0542  NA 129* 127* 127* 125* 125* 127* 127*  K 4.5 4.9 4.6 4.2 3.9 4.0 3.8  CL 105 103 105 102 101 103 101  CO2 16* 17* 17* 17* 16* 18* 18*  GLUCOSE 101* 107* 97 101* 103* 104* 98  BUN 31* 34* 30* 25* 22* 19 16  CREATININE 0.73 0.95 0.80 0.72 0.81 0.74 0.80  CALCIUM 8.2* 8.0* 8.0* 8.0* 7.9* 8.0* 8.0*  MG 1.6* 2.2 1.8  --   --   --   --   PHOS 4.5 4.3  --   --   --   --   --    Liver Function Tests: Recent Labs  Lab 11/30/21 0500 12/01/21 0500  12/06/21 0542  AST '19 20 19  ' ALT '28 29 23  ' ALKPHOS 121 104 96  BILITOT 0.4 0.5 0.5  PROT 6.3* 6.2* 6.4*  ALBUMIN 2.1* 2.1* 2.1*   No results for input(s): LIPASE, AMYLASE in the last 168 hours. No results for input(s): AMMONIA in the last 168 hours. CBC: Recent Labs  Lab 12/01/21 0500 12/02/21 0222 12/03/21 0414 12/04/21 0325 12/06/21 0542  WBC 7.5 7.9 6.8 6.8 6.7  HGB 7.9* 7.6* 7.7* 8.0* 8.0*  HCT 23.6* 23.4* 22.8* 24.2* 23.4*  MCV 89.7 90.3 88.7 90.0 88.3  PLT 246 266 267 294 275   Cardiac Enzymes: No results for input(s): CKTOTAL, CKMB, CKMBINDEX, TROPONINI  in the last 168 hours. BNP: Invalid input(s): POCBNP CBG: Recent Labs  Lab 11/30/21 1205 11/30/21 1712 11/30/21 2332 12/01/21 0618 12/01/21 1237  GLUCAP 106* 129* 114* 107* 106*   D-Dimer No results for input(s): DDIMER in the last 72 hours. Hgb A1c No results for input(s): HGBA1C in the last 72 hours. Lipid Profile No results for input(s): CHOL, HDL, LDLCALC, TRIG, CHOLHDL, LDLDIRECT in the last 72 hours. Thyroid function studies No results for input(s): TSH, T4TOTAL, T3FREE, THYROIDAB in the last 72 hours.  Invalid input(s): FREET3 Anemia work up No results for input(s): VITAMINB12, FOLATE, FERRITIN, TIBC, IRON, RETICCTPCT in the last 72 hours. Urinalysis    Component Value Date/Time   COLORURINE STRAW (A) 11/29/2021 1835   APPEARANCEUR CLEAR 11/29/2021 1835   LABSPEC 1.015 11/29/2021 [place] 7.0 11/29/2021 1835   GLUCOSEU NEGATIVE 11/29/2021 1835   HGBUR NEGATIVE 11/29/2021 [place] 11/29/2021 [name] 11/29/2021 [place] 11/29/2021 1835   NITRITE NEGATIVE 11/29/2021 1835   LEUKOCYTESUR MODERATE (A) 11/29/2021 1835   Sepsis Labs Invalid input(s): PROCALCITONIN,  WBC,  LACTICIDVEN Microbiology Recent Results (from the past 240 hour(s))  Culture, blood (Routine X 2) w Reflex to ID Panel     Status: None   Collection Time: 11/29/21   4:14 PM   Specimen: BLOOD  Result Value Ref Range Status   Specimen Description   Final    BLOOD RIGHT ANTECUBITAL Performed at [facility], [place] [address] [place], [place] [address]    Special Requests   Final    CORRECTED RESULTS AEROBIC BOTTLE ONLY PREVIOUSLY REPORTED AS: AEROCOCCUS SPECIES CORRECTED RESULTS CALLED TO: PHARMD ELIZABETH [id number] FCP   Culture   Final    NO GROWTH 5 DAYS Performed at [facility], [place] [address] [place], [place] [address]    Report Status 12/04/2021 FINAL  Final  Culture, blood (Routine X 2) w Reflex to ID Panel     Status: None   Collection Time: 11/29/21  4:19 PM   Specimen: BLOOD  Result Value Ref Range Status   Specimen Description   Final    BLOOD RIGHT ANTECUBITAL LOWER Performed at [facility] [address], [place], [place] [address]    Special Requests   Final    BOTTLES DRAWN AEROBIC ONLY Blood Culture adequate volume Performed at [facility] [address] [place], [place] [address]    Culture   Final    NO GROWTH 5 DAYS Performed at [facility], [place] [address], [place], [place] [address]    Report Status 12/04/2021 FINAL  Final  Urine Culture     Status: Abnormal   Collection Time: 11/29/21  6:35 PM   Specimen: Urine, Clean Catch  Result Value Ref Range Status   Specimen Description   Final    URINE, CLEAN CATCH Performed at [facility], [place] [address], [place], [place] [address]    Special Requests   Final    NONE Performed at [facility], [place] [address], [place], [place] [address]    Culture (A)  Final    <10,000 COLONIES/mL INSIGNIFICANT GROWTH Performed at [facility] [address], [place], [place] [address]    Report Status 11/30/2021 FINAL  Final     Time coordinating discharge: 35 minutes  SIGNED:   Aline August, MD  [facility] 12/06/2021, 12:49 PM

## 2021-12-06 NOTE — Plan of Care (Signed)
  Problem: Education: Goal: Knowledge of General Education information will improve Description: Including pain rating scale, medication(s)/side effects and non-pharmacologic comfort measures Outcome: Progressing   Problem: Health Behavior/Discharge Planning: Goal: Ability to manage health-related needs will improve Outcome: Progressing   Problem: Clinical Measurements: Goal: Ability to maintain clinical measurements within normal limits will improve Outcome: Progressing Goal: Will remain free from infection Outcome: Progressing Goal: Diagnostic test results will improve Outcome: Progressing Goal: Respiratory complications will improve Outcome: Progressing Goal: Cardiovascular complication will be avoided Outcome: Progressing   Problem: Activity: Goal: Risk for activity intolerance will decrease Outcome: Progressing   Problem: Nutrition: Goal: Adequate nutrition will be maintained Outcome: Progressing   Problem: Coping: Goal: Level of anxiety will decrease Outcome: Progressing   Problem: Elimination: Goal: Will not experience complications related to bowel motility Outcome: Progressing Goal: Will not experience complications related to urinary retention Outcome: Progressing   Problem: Pain Managment: Goal: General experience of comfort will improve Outcome: Progressing   Problem: Safety: Goal: Ability to remain free from injury will improve Outcome: Progressing   Problem: Skin Integrity: Goal: Risk for impaired skin integrity will decrease Outcome: Progressing   Problem: Fluid Volume: Goal: Hemodynamic stability will improve Outcome: Progressing   Problem: Clinical Measurements: Goal: Diagnostic test results will improve Outcome: Progressing Goal: Signs and symptoms of infection will decrease Outcome: Progressing   Problem: Respiratory: Goal: Ability to maintain adequate ventilation will improve Outcome: Progressing   Problem: Safety: Goal:  Non-violent Restraint(s) Outcome: Progressing   

## 2021-12-06 NOTE — Plan of Care (Signed)
DC orders received, instructions including DC medication and ostomy care given to pt and sig other. All questions answered. Pt to be transported to main exit via wheel chair.   Problem: Education: Goal: Knowledge of General Education information will improve Description: Including pain rating scale, medication(s)/side effects and non-pharmacologic comfort measures 12/06/2021 1459 by Warren Lacy, RN Outcome: Adequate for Discharge 12/06/2021 1432 by Warren Lacy, RN Outcome: Progressing   Problem: Health Behavior/Discharge Planning: Goal: Ability to manage health-related needs will improve 12/06/2021 1459 by Warren Lacy, RN Outcome: Adequate for Discharge 12/06/2021 1432 by Warren Lacy, RN Outcome: Progressing   Problem: Clinical Measurements: Goal: Ability to maintain clinical measurements within normal limits will improve 12/06/2021 1459 by Warren Lacy, RN Outcome: Adequate for Discharge 12/06/2021 1432 by Warren Lacy, RN Outcome: Progressing Goal: Will remain free from infection 12/06/2021 1459 by Warren Lacy, RN Outcome: Adequate for Discharge 12/06/2021 1432 by Warren Lacy, RN Outcome: Progressing Goal: Diagnostic test results will improve 12/06/2021 1459 by Warren Lacy, RN Outcome: Adequate for Discharge 12/06/2021 1432 by Warren Lacy, RN Outcome: Progressing Goal: Respiratory complications will improve 12/06/2021 1459 by Warren Lacy, RN Outcome: Adequate for Discharge 12/06/2021 1432 by Warren Lacy, RN Outcome: Progressing Goal: Cardiovascular complication will be avoided 12/06/2021 1459 by Warren Lacy, RN Outcome: Adequate for Discharge 12/06/2021 1432 by Warren Lacy, RN Outcome: Progressing   Problem: Activity: Goal: Risk for activity intolerance will decrease 12/06/2021 1459 by Warren Lacy, RN Outcome: Adequate for Discharge 12/06/2021 1432 by Warren Lacy, RN Outcome: Progressing    Problem: Nutrition: Goal: Adequate nutrition will be maintained 12/06/2021 1459 by Warren Lacy, RN Outcome: Adequate for Discharge 12/06/2021 1432 by Warren Lacy, RN Outcome: Progressing   Problem: Coping: Goal: Level of anxiety will decrease 12/06/2021 1459 by Warren Lacy, RN Outcome: Adequate for Discharge 12/06/2021 1432 by Warren Lacy, RN Outcome: Progressing   Problem: Elimination: Goal: Will not experience complications related to bowel motility 12/06/2021 1459 by Warren Lacy, RN Outcome: Adequate for Discharge 12/06/2021 1432 by Warren Lacy, RN Outcome: Progressing Goal: Will not experience complications related to urinary retention 12/06/2021 1459 by Warren Lacy, RN Outcome: Adequate for Discharge 12/06/2021 1432 by Warren Lacy, RN Outcome: Progressing   Problem: Pain Managment: Goal: General experience of comfort will improve 12/06/2021 1459 by Warren Lacy, RN Outcome: Adequate for Discharge 12/06/2021 1432 by Warren Lacy, RN Outcome: Progressing   Problem: Safety: Goal: Ability to remain free from injury will improve 12/06/2021 1459 by Warren Lacy, RN Outcome: Adequate for Discharge 12/06/2021 1432 by Warren Lacy, RN Outcome: Progressing   Problem: Skin Integrity: Goal: Risk for impaired skin integrity will decrease 12/06/2021 1459 by Warren Lacy, RN Outcome: Adequate for Discharge 12/06/2021 1432 by Warren Lacy, RN Outcome: Progressing   Problem: Fluid Volume: Goal: Hemodynamic stability will improve 12/06/2021 1459 by Warren Lacy, RN Outcome: Adequate for Discharge 12/06/2021 1432 by Warren Lacy, RN Outcome: Progressing   Problem: Clinical Measurements: Goal: Diagnostic test results will improve 12/06/2021 1459 by Warren Lacy, RN Outcome: Adequate for Discharge 12/06/2021 1432 by Warren Lacy, RN Outcome: Progressing Goal: Signs and symptoms of infection  will decrease 12/06/2021 1459 by Warren Lacy, RN Outcome: Adequate for Discharge 12/06/2021 1432 by Warren Lacy, RN Outcome: Progressing   Problem: Respiratory: Goal: Ability to maintain adequate ventilation will improve 12/06/2021  1459 by Warren Lacy, RN Outcome: Adequate for Discharge 12/06/2021 1432 by Warren Lacy, RN Outcome: Progressing   Problem: Safety: Goal: Non-violent Restraint(s) 12/06/2021 1459 by Warren Lacy, RN Outcome: Adequate for Discharge 12/06/2021 1432 by Warren Lacy, RN Outcome: Progressing

## 2021-12-06 NOTE — Progress Notes (Signed)
Routine line care: pt reports she may go home today. Recommend PICC exchange to single lumen if discharging on home antibiotics. Will need PICC exchange order. Discussed with Loma Sousa, RN.

## 2021-12-07 ENCOUNTER — Emergency Department (HOSPITAL_COMMUNITY)
Admission: EM | Admit: 2021-12-07 | Discharge: 2021-12-07 | Disposition: A | Discharge status: Home / Self Care | Payer: MEDICAID | Attending: Emergency Medicine | Admitting: Emergency Medicine

## 2021-12-07 ENCOUNTER — Telehealth: Payer: Self-pay | Admitting: Pharmacist

## 2021-12-07 ENCOUNTER — Other Ambulatory Visit: Payer: Self-pay

## 2021-12-07 ENCOUNTER — Encounter (HOSPITAL_COMMUNITY): Payer: Self-pay | Admitting: Emergency Medicine

## 2021-12-07 DIAGNOSIS — T82868A Thrombosis of vascular prosthetic devices, implants and grafts, initial encounter: Secondary | ICD-10-CM | POA: Insufficient documentation

## 2021-12-07 DIAGNOSIS — Y828 Other medical devices associated with adverse incidents: Secondary | ICD-10-CM | POA: Insufficient documentation

## 2021-12-07 DIAGNOSIS — T82898A Other specified complication of vascular prosthetic devices, implants and grafts, initial encounter: Secondary | ICD-10-CM

## 2021-12-07 DIAGNOSIS — Z79899 Other long term (current) drug therapy: Secondary | ICD-10-CM | POA: Insufficient documentation

## 2021-12-07 NOTE — ED Provider Notes (Signed)
[facility] DEPT Provider Note   CSN: [id number] Arrival date & time: 12/07/21  1638     History  Chief Complaint  Patient presents with   Vascular Access Problem    Jessica [name] is a 61 y.o. female.  61 year old female with a past medical history of alcohol abuse, bowel perforation, IV antibiotics via PICC line for treatment of abscess with recent hospitalization of 45 days.  Patient's spouse states he was giving her some medication, when suddenly this kinked and clotted, he has not been able to fix this.  They did call infectious disease who recommended patient be evaluated in the ER.  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 She denies any systemic signs of infection such as fever, nausea, or vomiting.   The history is provided by the patient, medical records and the spouse.      Home Medications Prior to Admission medications   Medication Sig Start Date End Date Taking? Authorizing Provider  acetaminophen (TYLENOL) 325 MG tablet Take 2 tablets (650 mg total) by mouth every 6 (six) hours as needed for moderate pain, fever, headache or mild pain. 12/06/21   Saverio Danker, PA-C  ascorbic acid (VITAMIN C) 500 MG tablet Take 1 tablet (500 mg total) by mouth 2 (two) times daily. 12/06/21   Aline August, MD  ferrous sulfate 325 (65 FE) MG tablet Take 1 tablet (325 mg total) by mouth 2 (two) times daily with a meal. 12/06/21   Aline August, MD  fluticasone (FLONASE) 50 MCG/ACT nasal spray Place 1-2 sprays into both nostrils daily as needed for allergies or rhinitis.    [provider]  gabapentin (NEURONTIN) 300 MG capsule Take 1 capsule (300 mg total) by mouth at bedtime. 12/06/21 01/05/22  Aline August, MD  loperamide (IMODIUM) 2 MG capsule Take 2 capsules (4 mg total) by mouth 2  (two) times daily. 12/06/21   Saverio Danker, PA-C  loratadine (CLARITIN) 10 MG tablet Take 10 mg by mouth daily as needed for allergies.    [provider]  methocarbamol (ROBAXIN) 500 MG tablet Take 1-2 tablets (500-1,000 mg total) by mouth every 8 (eight) hours as needed for muscle spasms. 12/06/21   Saverio Danker, PA-C  metoprolol tartrate (LOPRESSOR) 50 MG tablet Take 1 tablet (50 mg total) by mouth 2 (two) times daily. 12/06/21 01/05/22  Aline August, MD  Multiple Vitamins-Minerals (MULTIVITAMIN ADULTS 50+) TABS Take 1 tablet by mouth every morning.    [provider]  ondansetron (ZOFRAN) 8 MG tablet Take 8 mg by mouth daily as needed for nausea/vomiting. 10/10/21   [provider]  oxyCODONE (ROXICODONE) 5 MG immediate release tablet Take 1-2 tablets (5-10 mg total) by mouth every 4 (four) hours as needed. 12/06/21 12/06/22  Saverio Danker, PA-C  piperacillin-tazobactam (ZOSYN) IVPB Inject 13.5 g into the vein daily. Indication:  Intra-abdominal abscesses First Dose: Yes Last Day of Therapy:  12/30/21 Labs - Once weekly:  CBC/D and BMP, Labs - Every other week:  ESR and CRP Method of administration: Elastomeric (Continuous infusion) Method of administration may be changed at the discretion of home infusion pharmacist based upon assessment of the patient and/or caregiver's ability to self-administer the medication ordered. 12/06/21   Aline August, MD  polycarbophil (FIBERCON) 625 MG tablet Take 1 tablet (625 mg total) by  mouth 2 (two) times daily. 12/06/21   Saverio Danker, PA-C      Allergies    Patient has no known allergies.    Review of Systems   Review of Systems  Constitutional:  Negative for chills and fever.  HENT:  Negative for sore throat.   Respiratory:  Negative for shortness of breath.   Cardiovascular:  Negative for chest pain.  Gastrointestinal:  Negative for abdominal pain, nausea and vomiting.  Genitourinary:  Negative for flank pain.   Musculoskeletal:  Negative for back pain.  Neurological:  Negative for light-headedness and headaches.  All other systems reviewed and are negative.  Physical Exam Updated Vital Signs BP (!) 158/83   Pulse 81   Temp 99.1 F (37.3 C) (Oral)   Resp 19   SpO2 99%  Physical Exam Vitals and nursing note reviewed.  Constitutional:      Appearance: Normal appearance.  HENT:     Head: Normocephalic and atraumatic.     Mouth/Throat:     Mouth: Mucous membranes are dry.  Eyes:     Pupils: Pupils are equal, round, and reactive to light.  Cardiovascular:     Rate and Rhythm: Normal rate.  Abdominal:     General: Abdomen is flat.     Palpations: Abdomen is soft.     Tenderness: There is no abdominal tenderness.       Comments: Eakins pouch in place.   Musculoskeletal:     Cervical back: Normal range of motion and neck supple.  Skin:    General: Skin is warm and dry.       Neurological:     Mental Status: She is alert and oriented to person, place, and time.    ED Results / Procedures / Treatments   Labs (all labs ordered are listed, but only abnormal results are displayed) Labs Reviewed - No data to display  EKG None  Radiology [place] EKG Site Rite  Result Date: 12/06/2021 If Site Rite image not attached, placement could not be confirmed due to current cardiac rhythm.   Procedures Procedures    Medications Ordered in ED Medications - No data to display  ED Course/ Medical Decision Making/ A&P                           Medical Decision Making  Patient presents to the ED with a chief complaint of occlusion to her PICC line.  Patient is currently under the treatment of intra-abdominal abscesses, receiving IV Zosyn daily, husband reports he attempted to place antibiotic today however there was a kink and he occluded the line by accident.  She contacted infectious disease according to her chart and was instructed to be seen in the emergency department.  Patient denies  any systemic signs at this time, reports no nausea, no vomiting.  No increases in ostomy output.  They did arrive with a low-grade temp of 99.1, will recheck this prior to discharge from the emergency department.  IV team has been contacted in order to obtain replacement of PICC line.  8:06 PM patient seen by IV team, she had her PICC line not replaced but flushed and occlusion is no longer present.  Patient requesting discharge, stable for discharge.    Portions of this note were generated with [employer]. Dictation errors may occur despite best attempts at proofreading.   Final Clinical Impression(s) / ED Diagnoses Final diagnoses:  Occlusion of peripherally inserted central catheter (  PICC) line, initial encounter [facility])    Rx / [place] Orders ED Discharge Orders     None         Corinna Capra 12/07/21 2007    Daleen Bo, MD 12/07/21 2359

## 2021-12-07 NOTE — Discharge Instructions (Addendum)
You had your PICC line  was flushed today.    Continue your IV Antibiotics for your intra abdominal infection.

## 2021-12-07 NOTE — Progress Notes (Signed)
Spoke with primary RN regarding order for PICC exchange. States PICC is occluded. Consult for IV team at [facility] placed to assess PICC. PICC team to follow up.

## 2021-12-07 NOTE — Telephone Encounter (Signed)
She is.. I was originally going to send to him but wasn't sure since she has a follow up with you in 2ish weeks.

## 2021-12-07 NOTE — ED Triage Notes (Signed)
Pt arrives needing picc line replaced.

## 2021-12-07 NOTE — Telephone Encounter (Signed)
Nurse from [facility] called regarding patient's PICC line. She states that when she got to patient's house the injection cap was completely off and the PICC line was completely clotted. I asked if cath flo may work and RN states it would not due to the level of occlusion and the fact that she could not get any blood return or anything. Advised RN to tell the patient to go to the ED for a PICC line replacement. She was with patient and would relay information.   Arielys Wandersee L. Payton Prinsen, PharmD [facility] Clinical Pharmacist Practitioner

## 2021-12-07 NOTE — ED Notes (Signed)
Pt reported to me that her PICC line is clogged.

## 2021-12-07 NOTE — Progress Notes (Addendum)
Assessment of PICC due to reported occlusion. See IV flowsheet. Notified RN of blood return; notified PICC RN of all findings. Provided pointers to family member for flushing (make sure cap is secure before flush, hold cap when screwing flush onto line). He and pt verbalized understanding.

## 2021-12-09 ENCOUNTER — Telehealth: Payer: Self-pay

## 2021-12-09 NOTE — Telephone Encounter (Signed)
Spoke to patient advised appt is 12/22/21 she would like to keep appt date.

## 2021-12-09 NOTE — Telephone Encounter (Signed)
Attempted to call patient - VM box full, unable to leave a message.    [place], CMA

## 2021-12-16 ENCOUNTER — Encounter (HOSPITAL_COMMUNITY)
Admission: RE | Admit: 2021-12-16 | Discharge: 2021-12-16 | Disposition: A | Discharge status: Home / Self Care | Payer: Self-pay | Source: Ambulatory Visit | Attending: Nurse Practitioner | Admitting: Nurse Practitioner

## 2021-12-16 DIAGNOSIS — K9400 Colostomy complication, unspecified: Secondary | ICD-10-CM

## 2021-12-16 DIAGNOSIS — K651 Peritoneal abscess: Secondary | ICD-10-CM | POA: Insufficient documentation

## 2021-12-16 DIAGNOSIS — F1010 Alcohol abuse, uncomplicated: Secondary | ICD-10-CM | POA: Insufficient documentation

## 2021-12-16 DIAGNOSIS — K9429 Other complications of gastrostomy: Secondary | ICD-10-CM

## 2021-12-16 DIAGNOSIS — S31109A Unspecified open wound of abdominal wall, unspecified quadrant without penetration into peritoneal cavity, initial encounter: Secondary | ICD-10-CM | POA: Insufficient documentation

## 2021-12-16 DIAGNOSIS — Z433 Encounter for attention to colostomy: Secondary | ICD-10-CM | POA: Insufficient documentation

## 2021-12-16 DIAGNOSIS — Y828 Other medical devices associated with adverse incidents: Secondary | ICD-10-CM | POA: Insufficient documentation

## 2021-12-16 DIAGNOSIS — Y833 Surgical operation with formation of external stoma as the cause of abnormal reaction of the patient, or of later complication, without mention of misadventure at the time of the procedure: Secondary | ICD-10-CM | POA: Insufficient documentation

## 2021-12-16 DIAGNOSIS — T8183XA Persistent postprocedural fistula, initial encounter: Secondary | ICD-10-CM

## 2021-12-16 DIAGNOSIS — L24B1 Irritant contact dermatitis related to digestive stoma or fistula: Secondary | ICD-10-CM

## 2021-12-16 DIAGNOSIS — K631 Perforation of intestine (nontraumatic): Secondary | ICD-10-CM | POA: Insufficient documentation

## 2021-12-16 NOTE — Discharge Instructions (Signed)
Call Monday to let me know if pouches are working.  Supplies as follows Stoma powder Skin prep 1 piece convex pouch 1" for G tube site with barrier ring 1 piece convex pouch 1 1/2" for colostomy 1 piece flat pouch for midline abdominal wound Barrier rings around all stomas, wound perimeter

## 2021-12-16 NOTE — Progress Notes (Signed)
[facility]   Reason for visit:  LLQ colostomy [place] G tube site.  G tube dislodged and effluent continuously leaking.  There is mucus and liquid coming from this site.  Denuded skin to peristomal skin.  Midline abdominal wound with EC fistula present. Output from this site is very low, I am stopping Eakin pouch. Patient has no insurance, medicaid pending and has made no effort to enroll in patient assistance from [place].  They will not provide Eakin pouches, I will try and get her pouched in all [employer] pouches.  Patient requesting something "strong for pain"  I am not able to offer her anything.  She has her home OXY and partner administers this.  She complains it does not help.  She was just refilled via [facility] 12/12/21 HPI:  Alcohol abuse, Bowel perforation, intraabdominal abscesses.  ROS  Review of Systems  Constitutional:  Positive for appetite change and fatigue.       Decreased appetite.  Gastrointestinal:  Positive for nausea.       Leaking G tube site Colostomy Midline wound with EC fistula  Psychiatric/Behavioral:  The patient is nervous/anxious.        Pain due to skin breakdown around g tube site.   All other systems reviewed and are negative.  Vital signs:  BP (!) 154/73 (BP Location: Right Arm)   Pulse 78   Temp 97.9 F (36.6 C) (Oral)   Resp 18   SpO2 99%  Exam:  Physical Exam Vitals reviewed.  Constitutional:      Appearance: She is ill-appearing.  Abdominal:     General: Abdomen is flat.     Palpations: Abdomen is soft.     Tenderness: There is abdominal tenderness.     Comments: Contact dermatitis, abdominal tenderness  Skin:    General: Skin is dry.     Findings: Lesion and rash present.     Comments: Dermatitis Nonhealing surgical wound with EC fistula     Stoma type/location:  LLQ colostomy 1 3/8" G tube site  1" opening EC fistula:  8 cm x 5 cm  opening 5 cm x 3 cm, blue sutures in wound bed.  Scant effluent produced per partner,  Jeneen Rinks.  LMQ g tube site Midline abdominal wound with EC fistula Stomal assessment/size:  stoma, well budded, creasing at 3 and 9 o'clock G tube site, opening only Peristomal assessment:  Skin around colostomy is intact, creasing noted Skin around g tube with full thickness tissue loss. Photo in chart.  Midline abdominal wound with newly epithelialized perimeter.  Treatment options for stomal/peristomal skin:  Stoma powder and skin prep x 3 around dermatitis, g tube site Barrier ring around g tube and colostomy 2 barrier rings around perimeter of EC fistula wound All stomas/opening are measured and new patterns provided.  Pouches are applied and warmed to promote seal.  Output: liquid and mucus to G tube Soft brown stool to colostomy Scant brown feculent effluent to midline EC fistula Ostomy pouching: 1pc. Convex (1") to G tube site 1 piece 1 1/2" convex pouch to colostomy 1 piece flat to midline EC fistula. .  Education provided:  Patient's partner is given phone number to patient assistance and informed that they must enroll themselves.  [phone].  He agrees to call.  Patient had been using Eakin pouch in hospital.  The effluent has slowed to midline fistula and I am hopeful one piece pouch will work as they will not be able to obtain [employer]  pouches.  Both colostomy and  tube site are pouched with 1 piece convex pouches, smallest ones that would fit.  By using these, the 2 pouches do not overlap and I am hopeful we can heal the skin like this.     Impression/dx  Contact dermatitis G tube dislodged and leaking effluent MIdline EC fistula LLQ colostomy, intact pink patent and producing soft brown stool Discussion  New pouches demonstrated and applied.  Samples provided.  Plan  She does not wish to schedule a follow up appointment today.  I have available appointments next week Monday PM and Tuesday only.  She verbalizes understanding.     Visit time: 75 minutes.   Domenic Moras FNP-BC

## 2021-12-20 ENCOUNTER — Ambulatory Visit (HOSPITAL_COMMUNITY)
Admission: RE | Admit: 2021-12-20 | Discharge: 2021-12-20 | Disposition: A | Discharge status: Home / Self Care | Payer: Self-pay | Source: Ambulatory Visit | Attending: Nurse Practitioner | Admitting: Nurse Practitioner

## 2021-12-20 MED ORDER — LIDOCAINE 5 % EX OINT
TOPICAL_OINTMENT | Freq: Every day | CUTANEOUS | Status: DC | PRN
  Filled 2021-12-20: qty 35.44

## 2021-12-20 NOTE — Progress Notes (Signed)
[facility]   Reason for visit:  LUQ feeding tube dislodged and has since been leaking digestive effluent.  Attempted to pouch at last visit.   HPI:  Intraabdominal abscess with EC fistula LLQ colostomy LUQ feeding tube site isleaking digestive effluent ROS  Review of Systems  Constitutional:  Positive for activity change.       Does not wish to leave chair.   Gastrointestinal:  Positive for abdominal pain. Negative for blood in stool.       Intense pain around stoma.   Skin:  Positive for wound.       Fuk thickness injury to lower half of periatomal skin.  Psychiatric/Behavioral:  Positive for agitation and dysphoric mood.   All other systems reviewed and are negative.  Vital signs:  BP (!) 134/121   Pulse 100   Temp 97.6 F (36.4 C)   Resp 17   SpO2 94%  Exam:  Physical Exam Vitals reviewed.  Constitutional:      Appearance: Normal appearance.  HENT:     Mouth/Throat:     Mouth: Mucous membranes are moist.  Abdominal:     Comments: G tube site dislodged and digestive effluent expelled.  Skin:    Findings: No lesion or rash.     Comments: Unstageable pressure injury to thoracic spine.  Silicone foam placed over this area or paddin g to provide protection and promote seal.   Neurological:     Mental Status: She is alert and oriented to person, place, and time.  Psychiatric:     Comments: Agitated due to pain from peristomal skin breakdown     Stoma type/location:  LUQ feeding tube site Stomal assessment/size:  1" hole, no stoma.  Peristomal assessment:  Full thickness tissue loss on lower hemisphere.  PArtner at bedside that provides care.  He is insistent that pouching will not work.  Wants the feeding tube hole closed.  Discussed the nature of fistulas and how it would most likley not close spontoneously Treatment options for stomal/peristomal skin: Stoma powder and skin prep to peristomal skin Output: Liquid effluent expelled once she eats/drinks.   She is tearful, requesting additional analgesia.  I am unable to provide additional systemic pain medicine, after discussing with surgery PA.  She is on a regimen of pain medication. She states she is taking the Ocycontin for pain from peristomal breakdown. I ordered topical lidocaine to apply to peristomal skin for pouch removal for pouch application.  Last week, we had pouched in a 1 piece convex pouch and it lasted one day.  Since then, they had been changing daily, even with HH in place to assist.   Partner does not want to repouch stating it "will not work".  Patient and partner discuss and she does want to try as the practice of applying gauze and tape is not working and skin is raw once again. She is tearful, requesting Ativan as well.  I am not able to grant this request.   Ostomy pouching: Will switch to high output pouch with a wide spout Education provided:  Discussed trying pouching once more to promote healing   THEY AGREE to try.. patient was looking forward to trying to eat.  She has been withholding food and drink.  Although she chews ice through out the visit and liquid effluent is immediately expelled     Impression/dx  Leaking G tube with likely fistula. MIdline abdominal wound with pouching LLQ colosotmy with seal intact Plan  Pouched  in 1 piece high output pouch after skin prep snf barrrier ring. POuch warmed to promote seal Patient is cautioned to report to ED with dry mouth, decreased/dark urine And pain not manageable with PO meds Lidocaine sent home with patient.  Patient partner states that he will call patient assistance today for supplies.  Remains uninsured.   Visit time: 75 minutes.   Domenic Moras FNP-BC

## 2021-12-22 ENCOUNTER — Inpatient Hospital Stay: Payer: Self-pay | Admitting: Internal Medicine

## 2021-12-22 ENCOUNTER — Telehealth: Payer: Self-pay

## 2021-12-22 NOTE — Discharge Instructions (Signed)
Supplies provided for pouching.

## 2021-12-22 NOTE — Telephone Encounter (Signed)
Patient caregiver called to report patient feeding tube was leaking and questioned what to do. I advised caregiver we don't handle feeding tube or colostomy bags and to call GI or Gen Surgery provider to see if patient needed to be seen or for advice. Patient rescheduled appointment today with Dr.Wallace to Dr.Campbell on 6/20 at 3 pm.   Tomi Bamberger, CMA

## 2021-12-22 NOTE — Progress Notes (Deleted)
[facility]  CHIEF COMPLAINT:    Follow up for intra-abdominal abscess  SUBJECTIVE:    Jessica [name] is a 61 y.o. female with PMHx as below who presents to the clinic for intra-abdominal abscess.   Patient here today for hospital follow up from a very complicated and prolonged admission from 4/15-5/30/23.  She was admitted with intra-abdominal sepsis due to sigmoid colon perforation with Kleb oxytoca bacteremia.  This was complicated by abdominal abscess s/p drain placement with cultures growing PsA.  This drain was subsequently removed on 5/24.  She also has a midline EC fistula.  She was last seen by Dr Gale Journey during her admission on 5/26 whom recommended IV Zosyn x 4 weeks through 6/23 with repeat imaging around this time.  She has a PICC line in place.   She saw surgery on 6/6 at which time she noted that her G tube had fallen out and the site was quite large with erythema around the opening.  She was prescribed 7 days of doxycycline as well for this purpose in addition to the Zosyn.    Please see A&P for the details of today's visit and status of the patient's medical problems.   Patient's Medications  New Prescriptions   No medications on file  Previous Medications   ACETAMINOPHEN (TYLENOL) 325 MG TABLET    Take 2 tablets (650 mg total) by mouth every 6 (six) hours as needed for moderate pain, fever, headache or mild pain.   ASCORBIC ACID (VITAMIN C) 500 MG TABLET    Take 1 tablet (500 mg total) by mouth 2 (two) times daily.   FERROUS SULFATE 325 (65 FE) MG TABLET    Take 1 tablet (325 mg total) by mouth 2 (two) times daily with a meal.   FLUTICASONE (FLONASE) 50 MCG/ACT NASAL SPRAY    Place 1-2 sprays into both nostrils daily as needed for allergies or rhinitis.   GABAPENTIN (NEURONTIN) 300 MG CAPSULE    Take 1 capsule (300 mg total) by mouth at bedtime.   LOPERAMIDE (IMODIUM) 2 MG CAPSULE    Take 2 capsules (4 mg total) by mouth 2 (two) times  daily.   LORATADINE (CLARITIN) 10 MG TABLET    Take 10 mg by mouth daily as needed for allergies.   METHOCARBAMOL (ROBAXIN) 500 MG TABLET    Take 1-2 tablets (500-1,000 mg total) by mouth every 8 (eight) hours as needed for muscle spasms.   METOPROLOL TARTRATE (LOPRESSOR) 50 MG TABLET    Take 1 tablet (50 mg total) by mouth 2 (two) times daily.   MULTIPLE VITAMINS-MINERALS (MULTIVITAMIN ADULTS 50+) TABS    Take 1 tablet by mouth every morning.   ONDANSETRON (ZOFRAN) 8 MG TABLET    Take 8 mg by mouth daily as needed for nausea/vomiting.   OXYCODONE (ROXICODONE) 5 MG IMMEDIATE RELEASE TABLET    Take 1-2 tablets (5-10 mg total) by mouth every 4 (four) hours as needed.   PIPERACILLIN-TAZOBACTAM (ZOSYN) IVPB    Inject 13.5 g into the vein daily. Indication:  Intra-abdominal abscesses First Dose: Yes Last Day of Therapy:  12/30/21 Labs - Once weekly:  CBC/D and BMP, Labs - Every other week:  ESR and CRP Method of administration: Elastomeric (Continuous infusion) Method of administration may be changed at the discretion of home infusion pharmacist based upon assessment of the patient and/or caregiver's ability to self-administer the medication ordered.   POLYCARBOPHIL (FIBERCON) 625 MG TABLET  Take 1 tablet (625 mg total) by mouth 2 (two) times daily.  Modified Medications   No medications on file  Discontinued Medications   No medications on file      Past Medical History:  Diagnosis Date   Bacteremia due to Gram-negative bacteria 11/03/2021   Dehydration 11/05/2021   Delirium tremens ([place]) 12/04/2017   Hypernatremia 11/03/2021   Thrombocytopenia ([place]) 11/03/2021    Social History   Tobacco Use   Smoking status: Every Day    Packs/day: 1.00    Types: Cigarettes   Smokeless tobacco: Never  Substance Use Topics   Alcohol use: Yes    Comment: answer varies at present "1 bottle a night" or " 3-4 glasses of wine"   Drug use: Never    No family history on file.  No Known  Allergies  ROS   OBJECTIVE:    There were no vitals filed for this visit. There is no height or weight on file to calculate BMI.  Physical Exam   Labs and Microbiology:    Latest Ref Rng & Units 12/06/2021    5:42 AM 12/04/2021    3:25 AM 12/03/2021    4:14 AM  CBC  WBC 4.0 - 10.5 K/uL 6.7  6.8  6.8   Hemoglobin 12.0 - 15.0 g/dL 8.0  8.0  7.7   Hematocrit 36.0 - 46.0 % 23.4  24.2  22.8   Platelets 150 - 400 K/uL 275  294  267       Latest Ref Rng & Units 12/06/2021    5:42 AM 12/05/2021    3:13 AM 12/04/2021    3:25 AM  CMP  Glucose 70 - 99 mg/dL 98  104  103   BUN 6 - 20 mg/dL '16  19  22   ' Creatinine 0.44 - 1.00 mg/dL 0.80  0.74  0.81   Sodium 135 - 145 mmol/L 127  127  125   Potassium 3.5 - 5.1 mmol/L 3.8  4.0  3.9   Chloride 98 - 111 mmol/L 101  103  101   CO2 22 - 32 mmol/L '18  18  16   ' Calcium 8.9 - 10.3 mg/dL 8.0  8.0  7.9   Total Protein 6.5 - 8.1 g/dL 6.4     Total Bilirubin 0.3 - 1.2 mg/dL 0.5     Alkaline Phos 38 - 126 U/L 96     AST 15 - 41 U/L 19     ALT 0 - 44 U/L 23         ASSESSMENT & PLAN:    No problem-specific Assessment & Plan notes found for this encounter.   No orders of the defined types were placed in this encounter.    There are no diagnoses linked to this encounter.  She is doing overall okay on Zosyn via PICC line at this time.  She needs repeat CT scan to reassess her intra-abdominal collections.  Will order this and continue Zosyn for now.  Tentatively will extend her therapy an additional 2 weeks through 01/12/22 until she can have follow up imaging and coordination of next steps.  Will schedule her for follow up with Dr Gale Journey on 01/11/22 at 10:15am.  I will be away next week so I will also ask him to follow up on her CT scan that has been ordered today.   Raynelle Highland for Infectious Disease [facility] 12/22/2021, 5:25 AM

## 2021-12-26 ENCOUNTER — Encounter: Payer: Self-pay | Admitting: Internal Medicine

## 2021-12-27 ENCOUNTER — Other Ambulatory Visit: Payer: Self-pay

## 2021-12-27 ENCOUNTER — Encounter: Payer: Self-pay | Admitting: Internal Medicine

## 2021-12-27 ENCOUNTER — Ambulatory Visit (INDEPENDENT_AMBULATORY_CARE_PROVIDER_SITE_OTHER): Payer: Self-pay | Admitting: Internal Medicine

## 2021-12-27 ENCOUNTER — Telehealth: Payer: Self-pay

## 2021-12-27 DIAGNOSIS — K6811 Postprocedural retroperitoneal abscess: Secondary | ICD-10-CM

## 2021-12-27 DIAGNOSIS — T8143XA Infection following a procedure, organ and space surgical site, initial encounter: Secondary | ICD-10-CM

## 2021-12-27 NOTE — Progress Notes (Signed)
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    Priority: High   Colostomy in place [facility]) 10/30/2021    Priority: High   Bowel perforation ([place]) 10/22/2021    Priority: High   Abscess    Intra-abdominal abscess (HCC)    Pelvic abscess - pseudomonas - s/p percutaneous drainage 10/29/2021 11/28/2021   Aspiration pneumonia ([place]) 11/17/2021   Bacteremia due to Klebsiella pneumoniae 11/17/2021   Acute encephalopathy 11/17/2021   Humerus fracture 11/17/2021   Generalized abdominal pain    Sepsis ([place]) 11/09/2021   Malnutrition of moderate degree 11/07/2021   Palliative care by specialist    Goals of care, counseling/discussion    General weakness    Sinus tachycardia 11/05/2021   Normocytic anemia 11/04/2021   Acute respiratory failure with hypoxia ([place]) 86/76/7209   Acute metabolic encephalopathy 47/03/6282   Hypernatremia, hyponatremia, hypokalemia, hyperphosphatemia 11/03/2021   Hypokalemia 11/03/2021   Hyperphosphatemia 11/03/2021   Hyponatremia 11/03/2021   Elevated brain natriuretic peptide (BNP) level 11/03/2021   Right proximal humeral fracture 11/03/2021   Oropharyngeal dysphagia 11/03/2021   Stricture and stenosis of esophagus 10/30/2021   Gastrostomy tube in place [facility]) 10/30/2021   AKI (acute kidney injury) ([place]) 10/25/2021   Anxiety 10/24/2021   Chronic pain 10/24/2021   Allergic rhinitis 10/24/2021   Primary insomnia 10/24/2021   Tobacco use disorder 10/24/2021   Underweight 10/24/2021   Acquired hammer toe of right foot 10/24/2021   Uncontrolled hypertension 10/24/2021   Urticaria 10/24/2021   Dry mouth 10/24/2021   Protein-calorie malnutrition, severe ([place]) 10/24/2021   Substance induced mood disorder (HCC)     Patient's Medications  New Prescriptions   No medications on file  Previous  Medications   ACETAMINOPHEN (TYLENOL) 325 MG TABLET    Take 2 tablets (650 mg total) by mouth every 6 (six) hours as needed for moderate pain, fever, headache or mild pain.   ASCORBIC ACID (VITAMIN C) 500 MG TABLET    Take 1 tablet (500 mg total) by mouth 2 (two) times daily.   FERROUS SULFATE 325 (65 FE) MG TABLET    Take 1 tablet (325 mg total) by mouth 2 (two) times daily with a meal.   FLUTICASONE (FLONASE) 50 MCG/ACT NASAL SPRAY    Place 1-2 sprays into both nostrils daily as needed for allergies or rhinitis.   GABAPENTIN (NEURONTIN) 300 MG CAPSULE    Take 1 capsule (300 mg total) by mouth at bedtime.   LOPERAMIDE (IMODIUM) 2 MG CAPSULE    Take 2 capsules (4 mg total) by mouth 2 (two) times daily.   LORATADINE (CLARITIN) 10 MG TABLET    Take 10 mg by mouth daily as needed for allergies.   METHOCARBAMOL (ROBAXIN) 500 MG TABLET    Take 1-2 tablets (500-1,000 mg total) by mouth every 8 (eight) hours as needed for muscle spasms.   METOPROLOL TARTRATE (LOPRESSOR) 50 MG TABLET    Take 1 tablet (50 mg total) by mouth 2 (two) times daily.   MULTIPLE VITAMINS-MINERALS (MULTIVITAMIN ADULTS 50+) TABS    Take 1 tablet by mouth every morning.   ONDANSETRON (ZOFRAN) 8 MG TABLET    Take 8 mg by mouth daily as needed for nausea/vomiting.   OXYCODONE (ROXICODONE) 5 MG IMMEDIATE RELEASE TABLET    Take 1-2 tablets (5-10 mg total) by mouth every  4 (four) hours as needed.   PIPERACILLIN-TAZOBACTAM (ZOSYN) IVPB    Inject 13.5 g into the vein daily. Indication:  Intra-abdominal abscesses First Dose: Yes Last Day of Therapy:  12/30/21 Labs - Once weekly:  CBC/D and BMP, Labs - Every other week:  ESR and CRP Method of administration: Elastomeric (Continuous infusion) Method of administration may be changed at the discretion of home infusion pharmacist based upon assessment of the patient and/or caregiver's ability to self-administer the medication ordered.   POLYCARBOPHIL (FIBERCON) 625 MG TABLET    Take 1 tablet  (625 mg total) by mouth 2 (two) times daily.  Modified Medications   No medications on file  Discontinued Medications   No medications on file    Subjective: Aidel is in for her hospital follow-up visit.  She was admitted to the hospital on 10/22/2021 with Klebsiella bacteremia and sepsis complicating a perforated sigmoid colon.  She underwent a colostomy with G-tube placement.  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  She underwent percutaneous drain placement into one of the abscesses on 10/29/2021.  Cultures grew Pseudomonas.  She was started on piperacillin/tazobactam on 10/24/2021.  A follow-up CT scan on 11/10/2021 noted deeper pelvic abscesses that could not be drained percutaneously.  On 11/16/2021 IR upsized her percutaneous drain.  Persistence of the fistula was confirmed at that time.  Her last CT scan on 11/30/2021 revealed:  IMPRESSION: 1. Unchanged position of left-sided pigtail catheter. Previously identified fluid collection in this region is resolved. 2. Fluid collection within the posterior pelvis has decreased in volume from previous exam. The second, smaller presacral fluid collection is unchanged. 3. Unchanged appearance of moderate left pleural effusion with resolution of small right pleural effusion. 4. Similar appearance of diffusely increased caliber of the small bowel loops which may represent partial obstruction versus ileus. 5. Cholelithiasis. 6. Aortic Atherosclerosis (ICD10-I70.0).  She was discharged on 12/07/2021 to continue IV piperacillin/tazobactam.  Her PICC line had to be declotted on 1 occasion but otherwise she has had no problems tolerating it.  She feels like she has tolerated her antibiotics well.  She is complaining of abdominal pain. She was seen by general surgery last week because her G-tube site was leaking.  They are emptying her drain bag once daily.  It sounds like there is about 10 to 15 cc of  fluid.  She says the amount of drain fluid has increased slightly over the past week.  Review of Systems: Review of Systems  Constitutional:  Positive for malaise/fatigue and weight loss. Negative for chills, diaphoresis and fever.  Gastrointestinal:  Positive for abdominal pain. Negative for nausea and vomiting.  Skin:  Negative for rash.    Past Medical History:  Diagnosis Date   Bacteremia due to Gram-negative bacteria 11/03/2021   Dehydration 11/05/2021   Delirium tremens ([place]) 12/04/2017   Hypernatremia 11/03/2021   Thrombocytopenia ([place]) 11/03/2021    Social History   Tobacco Use   Smoking status: Every Day    Packs/day: 1.00    Types: Cigarettes   Smokeless tobacco: Never  Substance Use Topics   Alcohol use: Yes    Comment: answer varies at present "1 bottle a night" or " 3-4 glasses of wine"   Drug use: Never    No family history on file.  No Known Allergies  Objective: Vitals:   12/27/21 1503  BP: 129/87  Pulse: 85  Temp: (!) 97.5 F (36.4 C)  TempSrc: Oral   There  is no height or weight on file to calculate BMI.  Physical Exam Constitutional:      Comments: She is accompanied by her friend and significant other.  She became tearful when talking about what she has been through over the past 2 months.  Cardiovascular:     Rate and Rhythm: Normal rate.  Pulmonary:     Effort: Pulmonary effort is normal.  Abdominal:     General: There is no distension.     Palpations: Abdomen is soft.     Tenderness: There is no abdominal tenderness.     Comments: She has a compressive dressing over her left upper quadrant G-tube site that they requested I not remove.  She has large amount of liquid stool in her colostomy bag.  She has a midline drain bag over her wound and percutaneous drain.  There is a small amount of feculent appearing drainage in the drain bag.  Skin:    Comments: Her left arm PICC site looks good.     Lab Results    Problem List Items Addressed  This Visit       High   Postoperative intra-abdominal abscess    She has now been on piperacillin/tazobactam for her intra-abdominal abscesses for 2 months.  The abscess that the percutaneous drain was placed and has now resolved.  Although she still has some pelvic fluid collections I think that the potential benefit of continued antibiotic therapy is outweighed by the potential risks.  I discussed management options with them and they are in agreement with stopping piperacillin/tazobactam now and having her PICC removed.  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  I asked them to schedule a follow-up visit with her general surgeon, Dr. Leighton Ruff.  She will follow-up here in about 3 weeks.        Michel Bickers, MD [facility] for [facility] [phone] pager   [phone] cell 12/27/2021, 4:33 PM

## 2021-12-27 NOTE — Assessment & Plan Note (Signed)
She has now been on piperacillin/tazobactam for her intra-abdominal abscesses for 2 months.  The abscess that the percutaneous drain was placed and has now resolved.  Although she still has some pelvic fluid collections I think that the potential benefit of continued antibiotic therapy is outweighed by the potential risks.  I discussed management options with them and they are in agreement with stopping piperacillin/tazobactam now and having her PICC removed.  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  I asked them to schedule a follow-up visit with her general surgeon, Dr. Leighton Ruff.  She will follow-up here in about 3 weeks.

## 2021-12-27 NOTE — Telephone Encounter (Signed)
Per MD stop IV antibiotics today and pull picc. Called [facility] and spoke to Pharmacist [place] whom read back orders and verbalized understanding.

## 2021-12-30 ENCOUNTER — Ambulatory Visit (HOSPITAL_COMMUNITY): Payer: Self-pay | Admitting: Nurse Practitioner

## 2022-01-13 ENCOUNTER — Encounter (HOSPITAL_COMMUNITY): Payer: Self-pay | Admitting: Nurse Practitioner

## 2022-01-20 ENCOUNTER — Encounter (HOSPITAL_COMMUNITY): Payer: Self-pay | Admitting: Nurse Practitioner

## 2022-01-27 ENCOUNTER — Other Ambulatory Visit: Payer: Self-pay

## 2022-01-27 ENCOUNTER — Ambulatory Visit (INDEPENDENT_AMBULATORY_CARE_PROVIDER_SITE_OTHER): Payer: Self-pay | Admitting: Internal Medicine

## 2022-01-27 ENCOUNTER — Encounter: Payer: Self-pay | Admitting: Internal Medicine

## 2022-01-27 VITALS — BP 165/92 | HR 106 | Temp 98.3°F | Ht 71.0 in | Wt 116.0 lb

## 2022-01-27 DIAGNOSIS — K651 Peritoneal abscess: Secondary | ICD-10-CM

## 2022-01-27 NOTE — Patient Instructions (Addendum)
[facility], [facility] [address], [place], [place], [place] [phone] [place], [place] [address], [place], [place], [phone] [facility], [place] [address], [address] [place], [place] [phone] [facility] (many locations): [email or web address]     F/u as needed if fever, chill, malaise, increased abdominal pain

## 2022-01-27 NOTE — Progress Notes (Signed)
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   Malnutrition of moderate degree 11/07/2021   Palliative care by specialist    Goals of care, counseling/discussion    General weakness    Sinus tachycardia 11/05/2021   Normocytic anemia 11/04/2021   Acute respiratory failure with hypoxia ([place]) 26/37/8588   Acute metabolic encephalopathy 50/27/7412   Hypernatremia, hyponatremia, hypokalemia, hyperphosphatemia 11/03/2021   Hypokalemia 11/03/2021   Hyperphosphatemia 11/03/2021   Hyponatremia 11/03/2021   Elevated brain natriuretic peptide (BNP) level 11/03/2021   Right proximal humeral fracture 11/03/2021   Oropharyngeal dysphagia 11/03/2021   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   Stricture and stenosis of esophagus 10/30/2021   Gastrostomy tube in place [facility]) 10/30/2021   AKI (acute kidney injury) ([place]) 10/25/2021   Anxiety 10/24/2021   Chronic pain 10/24/2021   Allergic rhinitis 10/24/2021   Primary insomnia 10/24/2021   Tobacco use disorder 10/24/2021   Underweight 10/24/2021   Acquired hammer toe of right foot 10/24/2021   Uncontrolled hypertension 10/24/2021   Urticaria 10/24/2021   Dry mouth 10/24/2021   Protein-calorie malnutrition, severe ([place]) 10/24/2021   Bowel perforation ([place]) 10/22/2021   Substance induced mood disorder (HCC)     Patient's Medications  New Prescriptions   No medications on file  Previous Medications   ACETAMINOPHEN (TYLENOL) 325 MG TABLET    Take 2 tablets (650 mg  total) by mouth every 6 (six) hours as needed for moderate pain, fever, headache or mild pain.   ASCORBIC ACID (VITAMIN C) 500 MG TABLET    Take 1 tablet (500 mg total) by mouth 2 (two) times daily.   FERROUS SULFATE 325 (65 FE) MG TABLET    Take 1 tablet (325 mg total) by mouth 2 (two) times daily with a meal.   FLUTICASONE (FLONASE) 50 MCG/ACT NASAL SPRAY    Place 1-2 sprays into both nostrils daily as needed for allergies or rhinitis.   GABAPENTIN (NEURONTIN) 300 MG CAPSULE    Take 1 capsule (300 mg total) by mouth at bedtime.   LOPERAMIDE (IMODIUM) 2 MG CAPSULE    Take 2 capsules (4 mg total) by mouth 2 (two) times daily.   LORATADINE (CLARITIN) 10 MG TABLET    Take 10 mg by mouth daily as needed for allergies.   METHOCARBAMOL (ROBAXIN) 500 MG TABLET    Take 1-2 tablets (500-1,000 mg total) by mouth every 8 (eight) hours as needed for muscle spasms.   METOPROLOL TARTRATE (LOPRESSOR) 50 MG TABLET    Take 1 tablet (50 mg total) by mouth 2 (two) times daily.   MULTIPLE VITAMINS-MINERALS (MULTIVITAMIN ADULTS 50+) TABS    Take 1 tablet by mouth every morning.   ONDANSETRON (ZOFRAN) 8 MG TABLET    Take 8 mg by mouth daily as needed for nausea/vomiting.   OXYCODONE (ROXICODONE) 5 MG IMMEDIATE RELEASE TABLET    Take 1-2 tablets (5-10 mg total) by mouth every 4 (four) hours as needed.   PIPERACILLIN-TAZOBACTAM (ZOSYN) IVPB    Inject 13.5 g into the vein daily.  Indication:  Intra-abdominal abscesses First Dose: Yes Last Day of Therapy:  12/30/21 Labs - Once weekly:  CBC/D and BMP, Labs - Every other week:  ESR and CRP Method of administration: Elastomeric (Continuous infusion) Method of administration may be changed at the discretion of home infusion pharmacist based upon assessment of the patient and/or caregiver's ability to self-administer the medication ordered.   POLYCARBOPHIL (FIBERCON) 625 MG TABLET    Take 1 tablet (625 mg total) by mouth 2 (two) times daily.  Modified Medications   No  medications on file  Discontinued Medications   No medications on file    Subjective: Jessica [name] is in for her hospital follow-up visit.  She was admitted to the hospital on 10/22/2021 with Klebsiella bacteremia and sepsis complicating a perforated sigmoid colon.  She underwent a colostomy with G-tube placement.  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  She underwent percutaneous drain placement into one of the abscesses on 10/29/2021.  Cultures grew Pseudomonas.  She was started on piperacillin/tazobactam on 10/24/2021.  A follow-up CT scan on 11/10/2021 noted deeper pelvic abscesses that could not be drained percutaneously.  On 11/16/2021 IR upsized her percutaneous drain.  Persistence of the fistula was confirmed at that time.  Her last CT scan on 11/30/2021 revealed:  IMPRESSION: 1. Unchanged position of left-sided pigtail catheter. Previously identified fluid collection in this region is resolved. 2. Fluid collection within the posterior pelvis has decreased in volume from previous exam. The second, smaller presacral fluid collection is unchanged. 3. Unchanged appearance of moderate left pleural effusion with resolution of small right pleural effusion. 4. Similar appearance of diffusely increased caliber of the small bowel loops which may represent partial obstruction versus ileus. 5. Cholelithiasis. 6. Aortic Atherosclerosis (ICD10-I70.0).  She was discharged on 12/07/2021 to continue IV piperacillin/tazobactam.  Her PICC line had to be declotted on 1 occasion but otherwise she has had no problems tolerating it.  She feels like she has tolerated her antibiotics well.  She is complaining of abdominal pain. She was seen by general surgery last week because her G-tube site was leaking.  They are emptying her drain bag once daily.  It sounds like there is about 10 to 15 cc of fluid.  She says the amount of drain fluid has increased slightly over  the past week.   01/27/22 id clinic f/u Patient has been off abx since 6/20 visit with id clinic Feeling better, more energy, mobility, better appetite  At the abd incision some times there is clear discharge which has been chronic G-tube has been out She sees surgery next week for discussion of colostomy take down  No f/c   Review of Systems: All other ros is negative  Past Medical History:  Diagnosis Date   Bacteremia due to Gram-negative bacteria 11/03/2021   Dehydration 11/05/2021   Delirium tremens ([place]) 12/04/2017   Hypernatremia 11/03/2021   Thrombocytopenia ([place]) 11/03/2021    Social History   Tobacco Use   Smoking status: Every Day    Packs/day: 1.00    Types: Cigarettes   Smokeless tobacco: Never  Substance Use Topics   Alcohol use: Yes    Comment: answer varies at present "1 bottle a night" or " 3-4 glasses of wine"   Drug use: Never    No family history on file.  No Known Allergies  Objective: There were no vitals filed for this visit.  There is no height or weight on file to calculate  BMI.  Physical exam: General/constitutional: no distress, pleasant; conversant HEENT: Normocephalic, PER, Conj Clear, EOMI, Oropharynx clear Neck supple CV: rrr no mrg Lungs: clear to auscultation, normal respiratory effort Abd/skin: Soft, Nontender; colostomy functioning; the incision mostly had healed, there is a small opening in the middle with clear serosanguinous stain Ext: no edema Neuro: nonfocal MSK: no peripheral joint swelling/tenderness/warmth; back spines nontender   Lab Results Lab Results  Component Value Date   WBC 6.7 12/06/2021   HGB 8.0 (L) 12/06/2021   HCT 23.4 (L) 12/06/2021   MCV 88.3 12/06/2021   PLT 275 63/84/6659   Last metabolic panel Lab Results  Component Value Date   GLUCOSE 98 12/06/2021   NA 127 (L) 12/06/2021   K 3.8 12/06/2021   CL 101 12/06/2021   CO2 18 (L) 12/06/2021   BUN 16 12/06/2021   CREATININE 0.80 12/06/2021    GFRNONAA >60 12/06/2021   CALCIUM 8.0 (L) 12/06/2021   PHOS 4.3 12/01/2021   PROT 6.4 (L) 12/06/2021   ALBUMIN 2.1 (L) 12/06/2021   BILITOT 0.5 12/06/2021   ALKPHOS 96 12/06/2021   AST 19 12/06/2021   ALT 23 12/06/2021   ANIONGAP 8 12/06/2021      Problem List Items Addressed This Visit       Other   Intra-abdominal abscess ([place]) - Primary   Kleb oxytoca bsi s/p treatment   Intraabd abscess after perfed sigmoid s/p exlap/colostomy. Complicated by colocutaneous fistula which could e the ongoing drainage from the incision? Doing well off abx for 4 weeks Discuss with her 2 approaches to intraabd abscess --> unclear after several weeks (?exactly how long) that the abscesses (if not within liver) would be sterilized. One approach is to repeat ct and continue abx until gone and the others is watch and see after several weeks of abx  Her 4 weeks off abx and doing better suggest that the abscesses were sterilized.   I advise continue monitoring for the next several weeks and if abd pain, fever, chill, malaise, decreased appetite then follow up with ID clinic, otherwise she is likely cured  F/u surgery as discussed with them  Jabier Mutton, [place] for [place] [phone] pager   [phone] cell 01/27/2022, 2:47 PM

## 2022-01-31 ENCOUNTER — Encounter (HOSPITAL_COMMUNITY): Payer: Self-pay | Admitting: Nurse Practitioner

## 2022-05-19 ENCOUNTER — Encounter: Payer: Self-pay | Admitting: Gastroenterology

## 2022-05-30 ENCOUNTER — Telehealth: Payer: Self-pay | Admitting: *Deleted

## 2022-05-30 NOTE — Telephone Encounter (Signed)
While prepping chart, noted that patient has never seen Dr. Silverio Decamp.  She had an exploratory laparotomy in April and has an ostomy.  Spoke with pt and explained it would be best to have an OV with either Dr. Silverio Decamp or an APP prior to procedure, instead of having a PV only.  Understanding voiced.  OV made with Amy on 05-31-22 at 9:00.  PV 06-06-22 cancelled; colonoscopy not cancelled at this time.

## 2022-05-31 ENCOUNTER — Encounter: Payer: Self-pay | Admitting: Physician Assistant

## 2022-05-31 ENCOUNTER — Ambulatory Visit (INDEPENDENT_AMBULATORY_CARE_PROVIDER_SITE_OTHER): Payer: Self-pay | Admitting: Physician Assistant

## 2022-05-31 VITALS — BP 130/88 | HR 88 | Ht 71.0 in | Wt 133.0 lb

## 2022-05-31 DIAGNOSIS — K219 Gastro-esophageal reflux disease without esophagitis: Secondary | ICD-10-CM

## 2022-05-31 DIAGNOSIS — K222 Esophageal obstruction: Secondary | ICD-10-CM

## 2022-05-31 DIAGNOSIS — Z1211 Encounter for screening for malignant neoplasm of colon: Secondary | ICD-10-CM

## 2022-05-31 DIAGNOSIS — Z933 Colostomy status: Secondary | ICD-10-CM

## 2022-05-31 DIAGNOSIS — K631 Perforation of intestine (nontraumatic): Secondary | ICD-10-CM

## 2022-05-31 NOTE — Progress Notes (Signed)
Subjective:    Patient ID: Jessica [name], female    DOB: October 23, 1960, 61 y.o.   MRN: [id number]  HPI Jessica [name] is a pleasant 61 year old white female, new to GI, referred by Dr. Leighton [name] for colonoscopy and EGD.  She was last seen by surgery in July 2023 and is status post perforated sigmoid colon with pelvic abscess April 2023 at which time she underwent a Hartmann procedure which was complicated by alcohol withdrawal requiring intubation, she then developed a wound infection and colonic fistula to her wound which was managed with an additional ostomy appliance.  She also had G-tube placed due to inability to pass an NG tube however this fell out after she was discharged from the hospital, and she has been able to eat without difficulty Plan is for her to undergo a colostomy reversal, and it is felt that colonoscopy should be done as well as EGD due to concern for possible distal esophageal stricture, prior to undergoing repeat surgery. Patient has not had prior colonoscopy or EGD. At this time the fistula to the wound has resolved, and she is dealing with the ostomy.  She has no ongoing complaints of abdominal pain, and says she has been eating well and has actually gained about 14 pounds since surgery.  Unfortunately she went back to smoking postoperatively in order to "cope" and also resumed drinking alcohol on a daily basis with at least 4 glasses of wine per day Surgery has not been scheduled as yet. No recent labs  Review of Systems Pertinent positive and negative review of systems were noted in the above HPI section.  All other review of systems was otherwise negative.   Outpatient Encounter Medications as of 05/31/2022  Medication Sig   acetaminophen (TYLENOL) 325 MG tablet Take 2 tablets (650 mg total) by mouth every 6 (six) hours as needed for moderate pain, fever, headache or mild pain.   ascorbic acid (VITAMIN C) 500 MG tablet Take 1 tablet (500 mg total) by mouth 2 (two) times daily.    fluticasone (FLONASE) 50 MCG/ACT nasal spray Place 1-2 sprays into both nostrils daily as needed for allergies or rhinitis.   loratadine (CLARITIN) 10 MG tablet Take 10 mg by mouth daily as needed for allergies.   Multiple Vitamins-Minerals (MULTIVITAMIN ADULTS 50+) TABS Take 1 tablet by mouth every morning.   ferrous sulfate 325 (65 FE) MG tablet Take 1 tablet (325 mg total) by mouth 2 (two) times daily with a meal. (Patient not taking: Reported on 05/31/2022)   gabapentin (NEURONTIN) 300 MG capsule Take 1 capsule (300 mg total) by mouth at bedtime. (Patient not taking: Reported on 05/31/2022)   loperamide (IMODIUM) 2 MG capsule Take 2 capsules (4 mg total) by mouth 2 (two) times daily. (Patient not taking: Reported on 01/27/2022)   methocarbamol (ROBAXIN) 500 MG tablet Take 1-2 tablets (500-1,000 mg total) by mouth every 8 (eight) hours as needed for muscle spasms. (Patient not taking: Reported on 01/27/2022)   metoprolol tartrate (LOPRESSOR) 50 MG tablet Take 1 tablet (50 mg total) by mouth 2 (two) times daily. (Patient not taking: Reported on 05/31/2022)   ondansetron (ZOFRAN) 8 MG tablet Take 8 mg by mouth daily as needed for nausea/vomiting. (Patient not taking: Reported on 01/27/2022)   oxyCODONE (ROXICODONE) 5 MG immediate release tablet Take 1-2 tablets (5-10 mg total) by mouth every 4 (four) hours as needed. (Patient not taking: Reported on 01/27/2022)   piperacillin-tazobactam (ZOSYN) IVPB Inject 13.5 g into the vein daily.  Indication:  Intra-abdominal abscesses First Dose: Yes Last Day of Therapy:  12/30/21 Labs - Once weekly:  CBC/D and BMP, Labs - Every other week:  ESR and CRP Method of administration: Elastomeric (Continuous infusion) Method of administration may be changed at the discretion of home infusion pharmacist based upon assessment of the patient and/or caregiver's ability to self-administer the medication ordered. (Patient not taking: Reported on 01/27/2022)   polycarbophil  (FIBERCON) 625 MG tablet Take 1 tablet (625 mg total) by mouth 2 (two) times daily. (Patient not taking: Reported on 01/27/2022)   No facility-administered encounter medications on file as of 05/31/2022.   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   Malnutrition of moderate degree 11/07/2021   Palliative care by specialist    Goals of care, counseling/discussion    General weakness    Sinus tachycardia 11/05/2021   Normocytic anemia 11/04/2021   Acute respiratory failure with hypoxia ([place]) 81/44/8185   Acute metabolic encephalopathy 63/14/9702   Hypernatremia, hyponatremia, hypokalemia, hyperphosphatemia 11/03/2021   Hypokalemia 11/03/2021   Hyperphosphatemia 11/03/2021   Hyponatremia 11/03/2021   Elevated brain natriuretic peptide (BNP) level 11/03/2021   Right proximal humeral fracture 11/03/2021   Oropharyngeal dysphagia 11/03/2021   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   Stricture and stenosis of esophagus 10/30/2021   Gastrostomy tube in place [facility]) 10/30/2021   AKI (acute kidney injury) ([place]) 10/25/2021   Anxiety 10/24/2021   Chronic pain 10/24/2021   Allergic rhinitis 10/24/2021   Primary insomnia 10/24/2021   Tobacco use disorder 10/24/2021   Underweight 10/24/2021   Acquired hammer toe of right foot 10/24/2021   Uncontrolled hypertension 10/24/2021   Urticaria 10/24/2021   Dry mouth 10/24/2021   Protein-calorie malnutrition, severe ([place]) 10/24/2021   Bowel perforation ([place])  10/22/2021   Substance induced mood disorder ([place])    Social History   Socioeconomic History   Marital status: Single    Spouse name: Not on file   Number of children: 0   Years of education: Not on file   Highest education level: Not on file  Occupational History   Occupation: unempolyed  Tobacco Use   Smoking status: Every Day    Packs/day: 0.50    Types: Cigarettes   Smokeless tobacco: Never   Tobacco comments:    Working on quitting  Substance and Sexual Activity   Alcohol use: Not Currently    Comment: answer varies at present "1 bottle a night" or " 3-4 glasses of wine"   Drug use: Never   Sexual activity: Not on file  Other Topics Concern   Not on file  Social History Narrative   Not on file   Social Determinants of Health   Financial Resource Strain: Not on file  Food Insecurity: Not on file  Transportation Needs: Not on file  Physical Activity: Not on file  Stress: Not on file  Social Connections: Not on file  Intimate Partner Violence: Not on file    Ms. Barra's family history is not on file.  Objective:    Vitals:   05/31/22 0900  BP: 130/88  Pulse: 88  SpO2: 98%    Physical Exam Well-developed well-nourished thin white female in no acute distress, weight 133/BMI 18.5  HEENT; nontraumatic normocephalic, EOMI, PE R LA, sclera anicteric. Oropharynx; not examined today Neck; supple, no JVD Cardiovascular; regular rate and rhythm with S1-S2, no murmur rub or gallop Pulmonary; Clear bilaterally Abdomen; soft, no focal tenderness, colostomy in the left mid quadrant wide midline incisional scar healed nondistended, no palpable mass or hepatosplenomegaly, bowel sounds are active Rectal; not done today Skin; benign exam, no jaundice rash or appreciable lesions Extremities; no clubbing cyanosis or edema skin warm and dry Neuro/Psych; alert and oriented x4, grossly nonfocal mood and affect appropriate        Assessment & Plan:   #92  61 year old white female status post perforated sigmoid colon April 6415 complicated by pelvic abscess, requiring diverting .. postop colonic to wound fistula resolved  Colonoscopy is requested prior to consideration of colostomy reversal, patient has not had prior endoscopic evaluation  #2 inability to pass NG tube during hospitalization, rule out distal esophageal stricture, has no current complaints of dysphagia or odynophagia  #3 cholelithiasis #4 alcohol abuse disorder, hospitalization was complicated by alcohol withdrawal and requirement for reintubation.  Patient has resumed daily alcohol use, at least 4 glasses of wine daily-no cirrhosis by CT imaging #5 hypertension #6 weight loss secondary to acute illness, patient has successfully gained 14 pounds over the past months  Plan; patient has been scheduled for colonoscopy and EGD with possible esophageal dilation with Dr. Silverio Decamp on June 28, 2022.  Both procedures were discussed in detail with the patient including indications risk and benefits and she is agreeable to proceed. Further recommendations pending results of endoscopic evaluation Patient will need to be scheduled for follow-up with Dr. Marcello Moores post procedures to schedule colostomy   Patient was advised to decrease alcohol use to no more than 1 glass of wine per day. Maeryn Mcgath Genia Harold PA-C 05/31/2022   Cc: Deland Pretty, MD

## 2022-05-31 NOTE — Patient Instructions (Addendum)
If you are age 60 or older, your body mass index should be between 23-30. Your Body mass index is 18.55 kg/m. If this is out of the aforementioned range listed, please consider follow up with your Primary Care Provider.  If you are age 59 or younger, your body mass index should be between 19-25. Your Body mass index is 18.55 kg/m. If this is out of the aformentioned range listed, please consider follow up with your Primary Care Provider.   ________________________________________________________  The  GI providers would like to encourage you to use [facility] to communicate with providers for non-urgent requests or questions.  Due to long hold times on the telephone, sending your provider a message by [facility] may be a faster and more efficient way to get a response.  Please allow 48 business hours for a response.  Please remember that this is for non-urgent requests.   You have been scheduled for an endoscopy and colonoscopy. Please follow the written instructions given to you at your visit today. Please pick up your prep supplies at the pharmacy within the next 1-3 days. If you use inhalers (even only as needed), please bring them with you on the day of your procedure.   Due to recent changes in healthcare laws, you may see the results of your imaging and laboratory studies on MyChart before your provider has had a chance to review them.  We understand that in some cases there may be results that are confusing or concerning to you. Not all laboratory results come back in the same time frame and the provider may be waiting for multiple results in order to interpret others.  Please give [place] 48 hours in order for your provider to thoroughly review all the results before contacting the office for clarification of your results.    Thank you for entrusting me with your care and choosing [facility].  Amy Esterwood PA-C

## 2022-06-28 ENCOUNTER — Encounter: Payer: Self-pay | Admitting: Gastroenterology

## 2022-06-28 ENCOUNTER — Ambulatory Visit: Payer: Medicaid Other | Admitting: Gastroenterology

## 2022-06-28 VITALS — BP 142/73 | HR 85 | Temp 98.0°F | Resp 14 | Ht 71.0 in | Wt 133.0 lb

## 2022-06-28 DIAGNOSIS — K5720 Diverticulitis of large intestine with perforation and abscess without bleeding: Secondary | ICD-10-CM

## 2022-06-28 DIAGNOSIS — K2970 Gastritis, unspecified, without bleeding: Secondary | ICD-10-CM | POA: Diagnosis not present

## 2022-06-28 DIAGNOSIS — K2990 Gastroduodenitis, unspecified, without bleeding: Secondary | ICD-10-CM

## 2022-06-28 DIAGNOSIS — Z933 Colostomy status: Secondary | ICD-10-CM

## 2022-06-28 DIAGNOSIS — K219 Gastro-esophageal reflux disease without esophagitis: Secondary | ICD-10-CM

## 2022-06-28 DIAGNOSIS — Z1211 Encounter for screening for malignant neoplasm of colon: Secondary | ICD-10-CM

## 2022-06-28 MED ORDER — SODIUM CHLORIDE 0.9 % IV SOLN: 500.0000 mL | INTRAVENOUS | Status: DC

## 2022-06-28 NOTE — Patient Instructions (Addendum)
Handouts provided on gastritis, esophagitis, hiatal hernia and diverticulosis.   Resume previous diet. Continue present medications.  Await pathology results.  Follow an antireflux regimen (see GERD/hiatal hernia handout).  Repeat colonoscopy at the next available appointment because the bowel preparation was suboptimal. Will need rectal enema + extended bowel prep. See appointment reminders attached.   YOU HAD AN ENDOSCOPIC PROCEDURE TODAY AT [facility]:   Refer to the procedure report that was given to you for any specific questions about what was found during the examination.  If the procedure report does not answer your questions, please call your gastroenterologist to clarify.  If you requested that your care partner not be given the details of your procedure findings, then the procedure report has been included in a sealed envelope for you to review at your convenience later.  YOU SHOULD EXPECT: Some feelings of bloating in the abdomen. Passage of more gas than usual.  Walking can help get rid of the air that was put into your GI tract during the procedure and reduce the bloating. If you had a lower endoscopy (such as a colonoscopy or flexible sigmoidoscopy) you may notice spotting of blood in your stool or on the toilet paper. If you underwent a bowel prep for your procedure, you may not have a normal bowel movement for a few days.  Please Note:  You might notice some irritation and congestion in your nose or some drainage.  This is from the oxygen used during your procedure.  There is no need for concern and it should clear up in a day or so.  SYMPTOMS TO REPORT IMMEDIATELY:  Following lower endoscopy (colonoscopy or flexible sigmoidoscopy):  Excessive amounts of blood in the stool  Significant tenderness or worsening of abdominal pains  Swelling of the abdomen that is new, acute  Fever of 100F or higher  Following upper endoscopy (EGD)  Vomiting of blood or coffee  ground material  New chest pain or pain under the shoulder blades  Painful or persistently difficult swallowing  New shortness of breath  Fever of 100F or higher  Black, tarry-looking stools  For urgent or emergent issues, a gastroenterologist can be reached at any hour by calling [phone]. Do not use MyChart messaging for urgent concerns.    DIET:  We do recommend a small meal at first, but then you may proceed to your regular diet.  Drink plenty of fluids but you should avoid alcoholic beverages for 24 hours.  ACTIVITY:  You should plan to take it easy for the rest of today and you should NOT DRIVE or use heavy machinery until tomorrow (because of the sedation medicines used during the test).    FOLLOW UP: Our staff will call the number listed on your records the next business day following your procedure.  We will call around 7:15- 8:00 am to check on you and address any questions or concerns that you may have regarding the information given to you following your procedure. If we do not reach you, we will leave a message.     If any biopsies were taken you will be contacted by phone or by letter within the next 1-3 weeks.  Please call us at [phone] if you have not heard about the biopsies in 3 weeks.    SIGNATURES/CONFIDENTIALITY: You and/or your care partner have signed paperwork which will be entered into your electronic medical record.  These signatures attest to the fact that that the information above  on your After Visit Summary has been reviewed and is understood.  Full responsibility of the confidentiality of this discharge information lies with you and/or your care-partner.

## 2022-06-28 NOTE — Progress Notes (Signed)
Report to pacu rn. Vss. Care resumed by rn. 

## 2022-06-28 NOTE — Progress Notes (Signed)
Pt's states no medical or surgical changes since previsit or office visit.  CRNA made aware of BP

## 2022-06-28 NOTE — Op Note (Signed)
[facility] Patient Name: Jessica [name] Procedure Date: 06/28/2022 1:18 PM MRN: [id number] Endoscopist: Mauri Pole , MD, [id number] Age: 61 Referring MD:  Date of Birth: Apr 17, 1961 Gender: Female Account #: [id number] Procedure:                Upper GI endoscopy Indications:              Dysphagia Medicines:                Monitored Anesthesia Care Procedure:                Pre-Anesthesia Assessment:                           - Prior to the procedure, a History and Physical                            was performed, and patient medications and                            allergies were reviewed. The patient's tolerance of                            previous anesthesia was also reviewed. The risks                            and benefits of the procedure and the sedation                            options and risks were discussed with the patient.                            All questions were answered, and informed consent                            was obtained. Prior Anticoagulants: The patient has                            taken no anticoagulant or antiplatelet agents. ASA                            Grade Assessment: III - A patient with severe                            systemic disease. After reviewing the risks and                            benefits, the patient was deemed in satisfactory                            condition to undergo the procedure.                           After obtaining informed consent, the endoscope was  passed under direct vision. Throughout the                            procedure, the patient's blood pressure, pulse, and                            oxygen saturations were monitored continuously. The                            Endoscope was introduced through the mouth, and                            advanced to the second part of duodenum. The upper                            GI endoscopy was accomplished  without difficulty.                            The patient tolerated the procedure well. Scope In: Scope Out: Findings:                 The Z-line was regular and was found 38 cm from the                            incisors.                           LA Grade C (one or more mucosal breaks continuous                            between tops of 2 or more mucosal folds, less than                            75% circumference) esophagitis with no bleeding was                            found 36 to 38 cm from the incisors. Biopsies were                            taken with a cold forceps for histology.                           One benign-appearing, intrinsic mild stenosis was                            found 37 to 38 cm from the incisors. This stenosis                            measured 1.8 cm (inner diameter) x less than one cm                            (in length). The stenosis was traversed. A TTS  dilator was passed through the scope. Dilation with                            an 18-19-20 mm balloon dilator was performed to 20                            mm. The dilation site was examined following                            endoscope reinsertion and showed mild mucosal                            disruption.                           Patchy mild inflammation characterized by                            congestion (edema), erosions and erythema was found                            in the entire examined stomach. Biopsies were taken                            with a cold forceps for Helicobacter pylori testing.                           A 5 cm hiatal hernia was present.                           The cardia and gastric fundus were normal on                            retroflexion.                           The examined duodenum was normal. Complications:            No immediate complications. Estimated Blood Loss:     Estimated blood loss was minimal. Impression:                - Z-line regular, 38 cm from the incisors.                           - LA Grade C reflux esophagitis with no bleeding.                            Biopsied.                           - Benign-appearing esophageal stenosis. Dilated.                           - Gastritis. Biopsied.                           -  5 cm hiatal hernia.                           - Normal examined duodenum. Recommendation:           - Patient has a contact number available for                            emergencies. The signs and symptoms of potential                            delayed complications were discussed with the                            patient. Return to normal activities tomorrow.                            Written discharge instructions were provided to the                            patient.                           - Resume previous diet.                           - Continue present medications.                           - Await pathology results.                           - Follow an antireflux regimen. Mauri Pole, MD 06/28/2022 2:25:22 PM This report has been signed electronically.

## 2022-06-28 NOTE — Progress Notes (Signed)
Called to room to assist during endoscopic procedure.  Patient ID and intended procedure confirmed with present staff. Received instructions for my participation in the procedure from the performing physician.  

## 2022-06-28 NOTE — Progress Notes (Signed)
Please refer to office visit note 05/31/22 by [name]. No additional changes in H&P Patient is appropriate for planned procedure(s) and anesthesia in an ambulatory setting  K. Denzil Magnuson , MD [phone]

## 2022-06-28 NOTE — Op Note (Signed)
[place] Patient Name: Jessica [name] Procedure Date: 06/28/2022 1:18 PM MRN: [id number] Endoscopist: Mauri Pole , MD, [id number] Age: 61 Referring MD:  Date of Birth: 1960/08/03 Gender: Female Account #: [id number] Procedure:                Colonoscopy Indications:              Generalized abdominal pain, Follow-up of                            diverticulitis, Prior to Colostomy reversal Medicines:                Monitored Anesthesia Care Procedure:                After obtaining informed consent, the colonoscope                            was passed under direct vision. Throughout the                            procedure, the patient's blood pressure, pulse, and                            oxygen saturations were monitored continuously. The                            PCF-HQ190L Colonoscope was introduced through the                            anus and advanced to the the surgical stoma. The                            PCF-HQ190L Colonoscope was introduced through the                            descending colostomy and advanced to the the cecum,                            identified by appendiceal orifice and ileocecal                            valve. The colonoscopy was performed without                            difficulty. The patient tolerated the procedure                            well. The quality of the bowel preparation was                            fair. The ileocecal valve, appendiceal orifice, and                            rectum were photographed. Scope In: 1:52:45 PM Scope Out: 2:15:19 PM Scope Withdrawal Time: 0 hours 14 minutes 6 seconds  Total Procedure  Duration: 0 hours 22 minutes 34 seconds  Findings:                 The perianal and digital rectal examinations were                            normal.                           A large amount of stool was found in the rectum and                            in the recto-sigmoid colon,  precluding                            visualization. Lavage of the area was performed,                            resulting in incomplete clearance with continued                            poor visualization.                           There was evidence of a widely patent loop                            colostomy in the descending colon. This was                            characterized by healthy appearing mucosa.                           A few small-mouthed diverticula were found in the                            descending colon, transverse colon and ascending                            colon. Complications:            No immediate complications. Estimated Blood Loss:     Estimated blood loss was minimal. Impression:               - Preparation of the colon was fair.                           - Stool in the rectum and in the recto-sigmoid                            colon.                           - Widely patent loop colostomy with healthy                            appearing mucosa in the descending colon.                           -  Moderate diverticulosis in the descending colon,                            in the transverse colon and in the ascending colon.                           - No specimens collected. Recommendation:           - Patient has a contact number available for                            emergencies. The signs and symptoms of potential                            delayed complications were discussed with the                            patient. Return to normal activities tomorrow.                            Written discharge instructions were provided to the                            patient.                           - Resume previous diet.                           - Continue present medications.                           - Repeat colonoscopy at the next available                            appointment because the bowel preparation was                             suboptimal. Will need rectal enema + extended bowel                            prep Mauri Pole, MD 06/28/2022 2:30:23 PM This report has been signed electronically.

## 2022-06-29 ENCOUNTER — Telehealth: Payer: Self-pay

## 2022-06-29 NOTE — Telephone Encounter (Signed)
Attempted f/u call, no answer left VM.

## 2022-06-29 NOTE — Addendum Note (Signed)
Addended by: Casper Harrison on: 06/29/2022 08:57 AM   Modules accepted: Orders

## 2022-06-29 NOTE — Progress Notes (Signed)
Forgot to enter order for Ambulatory referral to GI when pt was rescheduled. Order entered now.

## 2022-07-06 ENCOUNTER — Encounter: Payer: Self-pay | Admitting: Gastroenterology

## 2022-07-24 ENCOUNTER — Other Ambulatory Visit: Payer: Self-pay

## 2022-07-24 ENCOUNTER — Ambulatory Visit (AMBULATORY_SURGERY_CENTER): Payer: Self-pay | Admitting: *Deleted

## 2022-07-24 VITALS — Ht 71.0 in | Wt 129.0 lb

## 2022-07-24 DIAGNOSIS — Z1211 Encounter for screening for malignant neoplasm of colon: Secondary | ICD-10-CM

## 2022-07-24 MED ORDER — NA SULFATE-K SULFATE-MG SULF 17.5-3.13-1.6 GM/177ML PO SOLN: 1.0000 | Freq: Once | ORAL | 0 refills | Status: AC

## 2022-07-24 NOTE — Progress Notes (Signed)
  Pre visit completed over telephone.  Instructions mailed.  No egg or soy allergy known to patient  No issues known to pt with past sedation with any surgeries or procedures Patient denies ever being told they had issues or difficulty with intubation  No FH of Malignant Hyperthermia Pt is not on diet pills Pt is not on  home 02  Pt is not on blood thinners  Pt denies issues with constipation  No A fib or A flutter  Pt instructed to use Single[employer]com or GoodRx for a price reduction on prep

## 2022-08-02 ENCOUNTER — Telehealth: Payer: Self-pay | Admitting: Gastroenterology

## 2022-08-02 NOTE — Telephone Encounter (Signed)
[employer] is calling with the patient regarding her procedure on 1/30 states they are needing prior authorization. Please advise

## 2022-08-03 NOTE — Telephone Encounter (Signed)
Patient calling to f/u on prior authorization.please advise as soon as possible.   Patient has to start prep.

## 2022-08-08 ENCOUNTER — Encounter: Payer: Medicaid Other | Admitting: Gastroenterology

## 2022-08-18 ENCOUNTER — Encounter: Payer: Medicaid Other | Admitting: Gastroenterology

## 2022-08-22 ENCOUNTER — Encounter: Payer: Medicaid Other | Admitting: Gastroenterology

## 2022-08-29 ENCOUNTER — Telehealth: Payer: Self-pay | Admitting: Gastroenterology

## 2022-08-29 DIAGNOSIS — Z1211 Encounter for screening for malignant neoplasm of colon: Secondary | ICD-10-CM

## 2022-08-29 MED ORDER — NA SULFATE-K SULFATE-MG SULF 17.5-3.13-1.6 GM/177ML PO SOLN: 1.0000 | Freq: Once | ORAL | 0 refills | Status: AC

## 2022-08-29 NOTE — Telephone Encounter (Signed)
Sent rx for suprep to [facility] on [address]  Phoned pt and she is aware.

## 2022-08-29 NOTE — Telephone Encounter (Signed)
Patient is calling needing her prep called into [facility] on [address]. Please advise

## 2022-09-12 ENCOUNTER — Telehealth: Payer: Self-pay | Admitting: Gastroenterology

## 2022-09-12 ENCOUNTER — Ambulatory Visit (HOSPITAL_COMMUNITY): Payer: Medicaid Other | Admitting: Nurse Practitioner

## 2022-09-12 ENCOUNTER — Ambulatory Visit: Payer: Medicaid Other | Admitting: Gastroenterology

## 2022-09-12 ENCOUNTER — Encounter: Payer: Self-pay | Admitting: Gastroenterology

## 2022-09-12 VITALS — BP 156/85 | HR 76 | Temp 98.7°F | Ht 71.0 in | Wt 129.4 lb

## 2022-09-12 DIAGNOSIS — K5720 Diverticulitis of large intestine with perforation and abscess without bleeding: Secondary | ICD-10-CM

## 2022-09-12 DIAGNOSIS — Z933 Colostomy status: Secondary | ICD-10-CM

## 2022-09-12 DIAGNOSIS — Z1211 Encounter for screening for malignant neoplasm of colon: Secondary | ICD-10-CM

## 2022-09-12 MED ORDER — SODIUM CHLORIDE 0.9 % IV SOLN: 500.0000 mL | INTRAVENOUS | Status: DC

## 2022-09-12 NOTE — Progress Notes (Unsigned)
Pt's states no medical or surgical changes since previsit or office visit. 

## 2022-09-12 NOTE — Progress Notes (Signed)
Pt not clean . Dr Silverio Decamp spoke with patient about doing procedure but procedure might be aborted.  Case cancelled

## 2022-09-12 NOTE — Telephone Encounter (Signed)
Inbound call from patient , she is needing to speak with a nurse regarding her procedure today at 2:30 pm .. She have some questions .please advise

## 2022-09-12 NOTE — Progress Notes (Unsigned)
Inadequate bowel prep. Will plan for C- scope through ostomy and flex sig through anorectum tomorrow AM  Miralax bowel prep Tap water enema in the morning  K. Denzil Magnuson , MD [phone]

## 2022-09-12 NOTE — Telephone Encounter (Signed)
Phone call returned all questions answered

## 2022-09-13 ENCOUNTER — Ambulatory Visit (AMBULATORY_SURGERY_CENTER): Payer: Medicaid Other | Admitting: Gastroenterology

## 2022-09-13 ENCOUNTER — Encounter: Payer: Self-pay | Admitting: Gastroenterology

## 2022-09-13 VITALS — BP 120/73 | HR 60 | Temp 96.4°F | Resp 13 | Ht 71.0 in | Wt 129.4 lb

## 2022-09-13 DIAGNOSIS — Z933 Colostomy status: Secondary | ICD-10-CM

## 2022-09-13 DIAGNOSIS — K9189 Other postprocedural complications and disorders of digestive system: Secondary | ICD-10-CM | POA: Diagnosis not present

## 2022-09-13 DIAGNOSIS — Z1211 Encounter for screening for malignant neoplasm of colon: Secondary | ICD-10-CM

## 2022-09-13 DIAGNOSIS — D125 Benign neoplasm of sigmoid colon: Secondary | ICD-10-CM

## 2022-09-13 DIAGNOSIS — K635 Polyp of colon: Secondary | ICD-10-CM | POA: Diagnosis not present

## 2022-09-13 MED ORDER — SODIUM CHLORIDE 0.9 % IV SOLN: 500.0000 mL | Freq: Once | INTRAVENOUS | Status: DC

## 2022-09-13 NOTE — Progress Notes (Unsigned)
[facility] History and Physical   Primary Care Physician:  Kristie Cowman, MD   Reason for Procedure:  Colon cancer screening  Plan:    Colonoscopy with possible interventions as needed     HPI: Jessica [name] is a very pleasant 62 y.o. female here for scope through colostomy and flex sig to excluded rectum for colorectal cancer screening prior to ostomy reversal.   The risks and benefits as well as alternatives of endoscopic procedure(s) have been discussed and reviewed. All questions answered. The patient agrees to proceed.    Past Medical History:  Diagnosis Date   Bacteremia due to Gram-negative bacteria 11/03/2021   Dehydration 11/05/2021   Delirium tremens ([place]) 12/04/2017   Hypernatremia 11/03/2021   Hypertension    Thrombocytopenia ([place]) 11/03/2021    Past Surgical History:  Procedure Laterality Date   COLONOSCOPY     IR CATHETER TUBE CHANGE  11/16/2021   LAPAROTOMY N/A 10/22/2021   Procedure: EXPLORATORY LAPAROTOMY hartmans;  Surgeon: Leighton Ruff, MD;  Location: [facility];  Service: General;  Laterality: N/A;    Prior to Admission medications   Medication Sig Start Date End Date Taking? Authorizing Provider  ascorbic acid (VITAMIN C) 500 MG tablet Take 1 tablet (500 mg total) by mouth 2 (two) times daily. 12/06/21  Yes Aline August, MD  Fluticasone-Umeclidin-Vilant (TRELEGY ELLIPTA IN) Inhale into the lungs daily.   Yes [provider]  hydrOXYzine (ATARAX) 10 MG tablet Take 10 mg by mouth 3 (three) times daily as needed. 09/04/22  Yes [provider]  lisinopril-hydrochlorothiazide (ZESTORETIC) 10-12.5 MG tablet Take 1 tablet by mouth daily. 08/21/22  Yes [provider]  Multiple Vitamins-Minerals (MULTIVITAMIN ADULTS 50+) TABS Take 1 tablet by mouth every morning.   Yes [provider]  propranolol (INDERAL) 20 MG tablet Take 20 mg by mouth 2 (two) times daily. 09/05/22  Yes [provider]   acetaminophen (TYLENOL) 325 MG tablet Take 2 tablets (650 mg total) by mouth every 6 (six) hours as needed for moderate pain, fever, headache or mild pain. 12/06/21   Saverio Danker, PA-C  fluticasone [facility]) 50 MCG/ACT nasal spray Place 1-2 sprays into both nostrils daily as needed for allergies or rhinitis. Patient not taking: Reported on 07/24/2022    [provider]  loratadine (CLARITIN) 10 MG tablet Take 10 mg by mouth daily as needed for allergies. Patient not taking: Reported on 07/24/2022    [provider]  VENTOLIN HFA 108 (90 Base) MCG/ACT inhaler Inhale 1 puff into the lungs daily. Patient not taking: Reported on 09/13/2022 07/13/22   [provider]    Current Outpatient Medications  Medication Sig Dispense Refill   ascorbic acid (VITAMIN C) 500 MG tablet Take 1 tablet (500 mg total) by mouth 2 (two) times daily. 60 tablet 0   Fluticasone-Umeclidin-Vilant (TRELEGY ELLIPTA IN) Inhale into the lungs daily.     hydrOXYzine (ATARAX) 10 MG tablet Take 10 mg by mouth 3 (three) times daily as needed.     lisinopril-hydrochlorothiazide (ZESTORETIC) 10-12.5 MG tablet Take 1 tablet by mouth daily.     Multiple Vitamins-Minerals (MULTIVITAMIN ADULTS 50+) TABS Take 1 tablet by mouth every morning.     propranolol (INDERAL) 20 MG tablet Take 20 mg by mouth 2 (two) times daily.     acetaminophen (TYLENOL) 325 MG tablet Take 2 tablets (650 mg total) by mouth every 6 (six) hours as needed for moderate pain, fever, headache or mild pain.     fluticasone (FLONASE)  50 MCG/ACT nasal spray Place 1-2 sprays into both nostrils daily as needed for allergies or rhinitis. (Patient not taking: Reported on 07/24/2022)     loratadine (CLARITIN) 10 MG tablet Take 10 mg by mouth daily as needed for allergies. (Patient not taking: Reported on 07/24/2022)     VENTOLIN HFA 108 (90 Base) MCG/ACT inhaler Inhale 1 puff into the lungs daily. (Patient not taking: Reported on 09/13/2022)     Current  Facility-Administered Medications  Medication Dose Route Frequency Provider Last Rate Last Admin   0.9 %  sodium chloride infusion  500 mL Intravenous Continuous Britney Captain V, MD       0.9 %  sodium chloride infusion  500 mL Intravenous Once Mauri Pole, MD        Allergies as of 09/13/2022 - Review Complete 09/13/2022  Allergen Reaction Noted   Codeine Nausea And Vomiting 12/12/2021    Family History  Problem Relation Age of Onset   Esophageal cancer Neg Hx    Colon cancer Neg Hx    Rectal cancer Neg Hx    Stomach cancer Neg Hx     Social History   Socioeconomic History   Marital status: Single    Spouse name: Not on file   Number of children: 0   Years of education: Not on file   Highest education level: Not on file  Occupational History   Occupation: unempolyed  Tobacco Use   Smoking status: Every Day    Packs/day: 1.00    Types: Cigarettes   Smokeless tobacco: Never   Tobacco comments:    Working on quitting  Vaping Use   Vaping Use: Never used  Substance and Sexual Activity   Alcohol use: Not Currently    Alcohol/week: 2.0 standard drinks of alcohol    Types: 2 Glasses of wine per week    Comment: answer varies at present "1 bottle a night" or " 3-4 glasses of wine"   Drug use: Never   Sexual activity: Not on file  Other Topics Concern   Not on file  Social History Narrative   Not on file   Social Determinants of Health   Financial Resource Strain: Not on file  Food Insecurity: Not on file  Transportation Needs: Not on file  Physical Activity: Not on file  Stress: Not on file  Social Connections: Not on file  Intimate Partner Violence: Not on file    Review of Systems:  All other review of systems negative except as mentioned in the HPI.  Physical Exam: Vital signs in last 24 hours: Blood Pressure 137/88   Pulse 70   Temperature (Abnormal) 96.4 F (35.8 C) (Temporal)   Height '5\' 11"'$  (1.803 m)   Weight 129 lb 6.4 oz (58.7 kg)    Oxygen Saturation 100%   Body Mass Index 18.05 kg/m  General:   Alert, NAD Lungs:  Clear .   Heart:  Regular rate and rhythm Abdomen:  Soft, nontender and nondistended. Neuro/Psych:  Alert and cooperative. Normal mood and affect. A and O x 3  Reviewed labs, radiology imaging, old records and pertinent past GI work up  Patient is appropriate for planned procedure(s) and anesthesia in an ambulatory setting   K. Denzil Magnuson , MD [phone]

## 2022-09-13 NOTE — Op Note (Signed)
[place] Patient Name: Jessica [name] Procedure Date: 09/13/2022 10:01 AM MRN: [id number] Endoscopist: Mauri Pole , MD, [id number] Age: 62 Referring MD:  Date of Birth: 05/31/1961 Gender: Female Account #: [id number] Procedure:                Colonoscopy Indications:              Preoperative assessment prior to colostomy reversal Medicines:                Monitored Anesthesia Care Procedure:                After obtaining informed consent, the colonoscope                            was passed under direct vision. Throughout the                            procedure, the patient's blood pressure, pulse, and                            oxygen saturations were monitored continuously. The                            Olympus [id number]-[id number] Colonoscope was                            introduced through the anus and advanced to the the                            surgical stoma. The Olympus [id number]-[id number]                            Colonoscope was introduced through the descending                            colostomy and advanced to the the cecum, identified                            by appendiceal orifice and ileocecal valve. The                            colonoscopy was performed without difficulty. The                            patient tolerated the procedure well. The quality                            of the bowel preparation was adequate. The                            ileocecal valve, appendiceal orifice, and rectum                            were photographed. Scope In: 10:17:58 AM Scope Out: 10:43:34 AM Scope Withdrawal Time: 0 hours 8 minutes 30 seconds  Total Procedure  Duration: 0 hours 25 minutes 36 seconds  Findings:                 The perianal and digital rectal examinations were                            normal.                           There was evidence of a prior end colonic                            anastomosis in the sigmoid  colon. This was                            non-patent and was characterized by friable mucosa                            in rectum and rectosigmoid colon and an intact                            staple line. The anastomosis was not traversed.                           A 9 mm friable polypoid lesion was found in the                            sigmoid colon near end colonic anastomosis . The                            polyp was semi-pedunculated. Biopsies were taken                            with a cold forceps for histology.                           There was evidence of a patent loop colostomy in                            the descending colon. This was characterized by                            healthy appearing mucosa.                           A few small-mouthed diverticula were found in the                            descending colon. Complications:            No immediate complications. Estimated Blood Loss:     Estimated blood loss was minimal. Impression:               - Non-patent end-to-end colo-colonic anastomosis,  characterized by friable mucosa and an intact                            staple line.                           - One 9 mm polyp in the sigmoid colon. Biopsied.                           - Patent loop colostomy with healthy appearing                            mucosa in the descending colon.                           - Diverticulosis in the descending colon. Recommendation:           - Patient has a contact number available for                            emergencies. The signs and symptoms of potential                            delayed complications were discussed with the                            patient. Return to normal activities tomorrow.                            Written discharge instructions were provided to the                            patient.                           - Continue present medications.                            - Resume previous diet.                           - Await pathology results.                           - Repeat colonoscopy date to be determined after                            pending pathology results are reviewed for                            surveillance based on pathology results. Mauri Pole, MD 09/13/2022 10:54:46 AM This report has been signed electronically.

## 2022-09-13 NOTE — Progress Notes (Unsigned)
Pt's states no medical or surgical changes since previsit or office visit.  No changes since hx reviewed on 09/12/22. Pt had poor prep and was rescheduled for today with additional prep.Jessica [name]

## 2022-09-13 NOTE — Progress Notes (Unsigned)
Uneventful anesthetic. Report to pacu rn. Vss. Care resumed by rn.

## 2022-09-13 NOTE — Patient Instructions (Signed)
Please read handouts provided. Continue present medications. Await pathology results.   YOU HAD AN ENDOSCOPIC PROCEDURE TODAY AT THE [facility]:   Refer to the procedure report that was given to you for any specific questions about what was found during the examination.  If the procedure report does not answer your questions, please call your gastroenterologist to clarify.  If you requested that your care partner not be given the details of your procedure findings, then the procedure report has been included in a sealed envelope for you to review at your convenience later.  YOU SHOULD EXPECT: Some feelings of bloating in the abdomen. Passage of more gas than usual.  Walking can help get rid of the air that was put into your GI tract during the procedure and reduce the bloating. If you had a lower endoscopy (such as a colonoscopy or flexible sigmoidoscopy) you may notice spotting of blood in your stool or on the toilet paper. If you underwent a bowel prep for your procedure, you may not have a normal bowel movement for a few days.  Please Note:  You might notice some irritation and congestion in your nose or some drainage.  This is from the oxygen used during your procedure.  There is no need for concern and it should clear up in a day or so.  SYMPTOMS TO REPORT IMMEDIATELY:  Following lower endoscopy (colonoscopy or flexible sigmoidoscopy):  Excessive amounts of blood in the stool  Significant tenderness or worsening of abdominal pains  Swelling of the abdomen that is new, acute  Fever of 100F or higher   For urgent or emergent issues, a gastroenterologist can be reached at any hour by calling [phone]. Do not use MyChart messaging for urgent concerns.    DIET:  We do recommend a small meal at first, but then you may proceed to your regular diet.  Drink plenty of fluids but you should avoid alcoholic beverages for 24 hours.  ACTIVITY:  You should plan to take it easy  for the rest of today and you should NOT DRIVE or use heavy machinery until tomorrow (because of the sedation medicines used during the test).    FOLLOW UP: Our staff will call the number listed on your records the next business day following your procedure.  We will call around 7:15- 8:00 am to check on you and address any questions or concerns that you may have regarding the information given to you following your procedure. If we do not reach you, we will leave a message.     If any biopsies were taken you will be contacted by phone or by letter within the next 1-3 weeks.  Please call us at [phone] if you have not heard about the biopsies in 3 weeks.    SIGNATURES/CONFIDENTIALITY: You and/or your care partner have signed paperwork which will be entered into your electronic medical record.  These signatures attest to the fact that that the information above on your After Visit Summary has been reviewed and is understood.  Full responsibility of the confidentiality of this discharge information lies with you and/or your care-partner. 

## 2022-09-13 NOTE — Progress Notes (Signed)
Called to room to assist during endoscopic procedure.  Patient ID and intended procedure confirmed with present staff. Received instructions for my participation in the procedure from the performing physician.  

## 2022-09-14 ENCOUNTER — Telehealth: Payer: Self-pay

## 2022-09-14 ENCOUNTER — Encounter: Payer: Self-pay | Admitting: Gastroenterology

## 2022-09-14 NOTE — Telephone Encounter (Signed)
Left message on follow up call. 

## 2022-09-15 ENCOUNTER — Ambulatory Visit (HOSPITAL_COMMUNITY): Payer: Medicaid Other | Admitting: Nurse Practitioner

## 2022-09-19 ENCOUNTER — Ambulatory Visit (HOSPITAL_COMMUNITY)
Admission: RE | Admit: 2022-09-19 | Discharge: 2022-09-19 | Disposition: A | Discharge status: Home / Self Care | Payer: Medicaid Other | Source: Ambulatory Visit | Attending: Family Medicine | Admitting: Family Medicine

## 2022-09-19 DIAGNOSIS — K632 Fistula of intestine: Secondary | ICD-10-CM | POA: Insufficient documentation

## 2022-09-19 DIAGNOSIS — Z433 Encounter for attention to colostomy: Secondary | ICD-10-CM | POA: Insufficient documentation

## 2022-09-19 DIAGNOSIS — L24B3 Irritant contact dermatitis related to fecal or urinary stoma or fistula: Secondary | ICD-10-CM | POA: Diagnosis not present

## 2022-09-19 DIAGNOSIS — K9400 Colostomy complication, unspecified: Secondary | ICD-10-CM

## 2022-09-19 DIAGNOSIS — T8183XS Persistent postprocedural fistula, sequela: Secondary | ICD-10-CM | POA: Diagnosis not present

## 2022-09-19 NOTE — Discharge Instructions (Signed)
Set up with [facility] 2 piece [employer] Production designer, theatre/television/film (standard)

## 2022-09-19 NOTE — Progress Notes (Signed)
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   HPI:  LLQ colostomy Past Medical History:  Diagnosis Date   Bacteremia due to Gram-negative bacteria 11/03/2021   Dehydration 11/05/2021   Delirium tremens ([place]) 12/04/2017   Hypernatremia 11/03/2021   Hypertension    Thrombocytopenia ([place]) 11/03/2021   Family History  Problem Relation Age of Onset   Esophageal cancer Neg Hx    Colon cancer Neg Hx    Rectal cancer Neg Hx    Stomach cancer Neg Hx    Allergies  Allergen Reactions   Codeine Nausea And Vomiting   Current Outpatient Medications  Medication Sig Dispense Refill Last Dose   acetaminophen (TYLENOL) 325 MG tablet Take 2 tablets (650 mg total) by mouth every 6 (six) hours as needed for moderate pain, fever, headache or mild pain.      ascorbic acid (VITAMIN C) 500 MG tablet Take 1 tablet (500 mg total) by mouth 2 (two) times daily. 60 tablet 0    fluticasone (FLONASE) 50 MCG/ACT nasal spray Place 1-2 sprays into both nostrils daily as needed for allergies or rhinitis. (Patient not taking: Reported on 07/24/2022)      Fluticasone-Umeclidin-Vilant (TRELEGY ELLIPTA IN) Inhale into the lungs daily.      hydrOXYzine (ATARAX) 10 MG tablet Take 10 mg by mouth 3 (three) times daily as needed.      lisinopril-hydrochlorothiazide (ZESTORETIC) 10-12.5 MG tablet Take 1 tablet by mouth daily.      loratadine (CLARITIN) 10 MG tablet Take 10 mg by mouth daily as needed for allergies. (Patient not taking: Reported on 07/24/2022)      Multiple Vitamins-Minerals (MULTIVITAMIN ADULTS 50+) TABS Take 1 tablet by mouth every morning.      propranolol (INDERAL) 20 MG tablet Take 20 mg by mouth 2 (two) times daily.      VENTOLIN HFA 108 (90 Base) MCG/ACT inhaler Inhale 1 puff into the lungs daily. (Patient not taking: Reported on 09/13/2022)      No current facility-administered medications for this  encounter.   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       Healed midline incision  All other systems reviewed and are negative.  Vital signs:  BP (!) 154/83 (BP Location: Right Arm)   Pulse 68   Temp 98 F (36.7 C) (Oral)   Resp 18   SpO2 100%  Exam:  Physical Exam Vitals reviewed.  Constitutional:      Appearance: Normal appearance.  Abdominal:     Palpations: Abdomen is soft.     Comments: Scarring midline abdomen  Skin:    General: Skin is warm and dry.     Findings: Erythema present.  Neurological:     Mental Status: She is alert and oriented to person, place, and time.  Psychiatric:        Mood and Affect: Mood normal.        Behavior: Behavior normal.     Stoma type/location:  LUQ colostomy Stomal assessment/size:  1 1/2" pink and moist stoma  Peristomal assessment:  erythema and irritation Treatment options for stomal/peristomal skin: stoma powder and skin prep with 2 piece mio flip pouch. Has been using samples.  We will enroll with [facility] now that payor source has been established.  Output:  soft brown stool Ostomy pouching:2 piece mio flip with barrier ring will suggest a belt as well.  Education provided:  we perform a pouch change and I provide samples.  We will enroll with [facility]    Impression/dx  Colostomy complication Fistula, resolved Discussion  Rationale for 2 piece pouch, barrier ring  stoma powder and skin prep as needed Ostomy belt Plan  See back in 2 weeks.     Visit time: 50 minutes.   Domenic Moras FNP-BC

## 2022-09-21 ENCOUNTER — Encounter: Payer: Self-pay | Admitting: Gastroenterology

## 2022-09-22 DIAGNOSIS — K9400 Colostomy complication, unspecified: Secondary | ICD-10-CM | POA: Insufficient documentation

## 2022-09-22 DIAGNOSIS — T8183XA Persistent postprocedural fistula, initial encounter: Secondary | ICD-10-CM | POA: Insufficient documentation

## 2022-09-22 DIAGNOSIS — L24B3 Irritant contact dermatitis related to fecal or urinary stoma or fistula: Secondary | ICD-10-CM | POA: Insufficient documentation

## 2022-12-04 NOTE — Telephone Encounter (Signed)
erroneous

## 2023-01-06 NOTE — Discharge Summary (Addendum)
 [facility]  Discharge Summary  PCP: AURORA MOLT, MD Discharge Details   Admit date:         01/01/2023 Discharge date:         01/06/2023 Hospital Days:    5 days  Active Hospital Problems   Diagnosis Date Noted POA  . 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 Alcohol abuse 01/05/2023 Yes  . Alcohol withdrawal syndrome with complication (*) 01/05/2023 Yes  . Anxiety 10/24/2021 Yes  . Tobacco use disorder 10/24/2021 Yes  . Uncontrolled hypertension 10/24/2021 Yes    Resolved Hospital Problems  No resolved problems to display.    Current Discharge Medication List     DISCONTINUED medications     eszopiclone (LUNESTA) 2 mg TABS      lisinopril-hydrochlorothiazide (PRINZIDE,ZESTORETIC) 10-12.5 MG per tablet      propranolol HCl (INDERAL) 20 mg tablet      QUEtiapine fumarate (SEROQUEL) 25 mg tablet        NEW medications   Details  amLODIPine besylate (NORVASC) 5 mg tablet Take one tablet (5 mg dose) by mouth daily. Start date: 01/07/2023    oxyCODONE  HCl (ROXICODONE ) 5 mg immediate release tablet Take one tablet (5 mg dose) by mouth every 6 (six) hours as needed for Pain for up to 7 days. Max Daily Amount: 20 mg Start date: 01/05/2023, End date: 01/12/2023       CONTINUED medications   Details  albuterol  sulfate HFA (VENTOLIN  HFA) 108 (90 Base) MCG/ACT inhaler Inhale one puff into the lungs as needed for Wheezing or Shortness of Breath. Not sure    Ascorbic Acid  (VITA-C PO) Take 1 tablet by mouth every morning.    budesonide-formoterol (SYMBICORT) 160-4.5 mcg/actuation inhaler Inhale two puffs into the lungs 2 (two) times daily.    CALCIUM  PO Take 1 tablet by mouth daily.    hydrOXYzine HCl (ATARAX) 10 mg tablet Take one tablet (10 mg dose) by mouth daily as needed for Itching or Anxiety.    lisinopril (PRINIVIL,ZESTRIL) 20 mg tablet Take one tablet (20 mg dose) by mouth daily.    lovastatin (MEVACOR) 10 mg tablet Take one  tablet (10 mg dose) by mouth at bedtime.    Multiple Vitamins-Minerals (MULTIVITAMIN ADULTS 50+) tablet Take 1 tablet by mouth every morning.    traZODone (DESYREL) 50 MG tablet Take one half tablet to one tablet (25-50 mg dose) by mouth daily as needed.    VITAMIN D  PO Take 1 tablet by mouth daily.       Hospital Course  Physicians involved in care during this hospitalization Attending Provider: Penne LITTIE Rodney, MD Attending Provider: Caprice FORBES Essex, MD Admitting Provider: Penne LITTIE Rodney, MD Anesthesiologist: Belvie JINNY Civatte, MD Anesthesiologist: Comer LELON Sides, MD Consulting Physician: Lonni Norman Lukes, MD Consulting Physician: Gladis MARLA Sands, MD Consulting Physician: [employer] Consult To [facility] [employer] Consulting Physician: Ronal MARLA Kalata, NP Consulting Physician: Alm VEAR Hoyer, DO Consulting Physician: Arthea CHRISTELLA Hoe, MD  Indication for Admission: Colostomy in place Hospital Course:       She was admitted and underwent the below surgery which she tolerated well.  Postoperatively she had gradual return of bowel function.  Diet was advanced accordingly.  She has a history of alcoholism and at 1 point was exhibiting symptoms suggestive of withdrawal.  She was evaluated by the medical service and the next day was deemed fit for discharge without evidence of ongoing EtOH withdrawal. She was  tolerating a diet and having nonbloody bowel movements.  She was ambulating well.  She has been cleared for discharge as well from general surgery.  Follow-up will be arranged with their office for drain removal next week. She was discharged home in stable condition.  Please see the hospital record for the full details of the patient's hospital course.    Bedside Procedures   No orders found     Procedure(s) (LRB): (ERAS) CYSTOSCOPY WITH BILATERAL URETERAL STENT PLACEMENT (Bilateral) (ERAS) OPEN COLOSTOMY TAKEDOWN, PARTIAL PROCTECTOMY,  EXTENSIVE LYSIS OF ADHESIONS, BILATERAL TAP BLOCKS (N/A) OPEN HERNIA INCISIONAL VENTRAL REPAIR WITH MESH (N/A)  01/01/2023  Surgeon(s): Penne LITTIE Rodney, MD Lonni Norman Lukes, MD Caprice FORBES Essex, MD Caprice FORBES Essex, MD ------------------- [facility]   Activity Instructions     Driving Restrictions     No driving on pain pills.   Driving Restrictions     No driving on pain pills.   Shower/Bath     OK to shower, no soaking in tub x 2 weeks   Shower/Bath     OK to shower, no soaking in tub x 2 weeks      Diet Instructions     No dietary restrictions     Soft GI diet per provided instructions for 2 weeks.  You may then resume a regular diet.   No dietary restrictions        Other Instructions     Ambulatory Referral to General Surgery     Monday or Tuesday next week with either Dr. Lukes or [facility] for possible drain removal and wound check   Reason for referral: follow up for possible drain removal and wound check   Referral Type: General Surgery   Evaluate and Return   Discharge instructions     [facility] Discharge Instructions for Colon Surgery Patients  The following recommendations are all-inclusive and intended cover any of your particular needs. Some of the information may not be applicable to your particular recovery.  Follow-up: You will be contacted by our office to arrange your follow-up appointment. If you do not have staples in your incision or if they have already been removed prior to discharge, follow-up will likely be within the next 2-4 weeks. If staples remain we would anticipate seeing you within the next week for staple removal. If any questions please call our office (see office numbers below).  Drains: You may be discharged home with a drainage tube. The nursing staff will teach her how to empty and record the output. You will likely be seen in the office in the next week to check on the drain and  likely remove.  Hygiene: Showering is okay.  You may shower if surgical clips or skin glue (dermabond) is present. Bathing in a tub is not recommended for the first week after surgery or until 48 hours after any staples are removed.  Soaking in a hot tub (with submersion of recent surgical wound is not recommended until third week after surgery or when approved by your surgeon).    Activity: No heavy lifting greater than 15 pounds for at least 6 weeks unless otherwise directed. Climbing stairs, walking, or any light aerobic activity is okay. Gradual increase in activity as tolerated is recommended. Sexual activity as desired.  No driving for at least 1-2 weeks after discharge from the hospital or while taking pain medications. Your pain should be controlled without use of pain medicine and your mind should  be clear thinking before you resume driving.  Call if any fevers greater than 101F and/or associated abdominal pains. Call if any increase in redness or swelling from the incisions. Call if any increased drainage or change in drainage from the incision. Some drainage will be normal and will improve with time.  Return to work: Discuss with your provider at follow-up.  Pain management: Many patients only require over-the-counter pain medicine (Tylenol , ibuprofen) at discharge.  Otherwise you may have been given a prescription for pain medicine. Please take as directed. Do not drive or operate heavy machinery while taking pain medication. Pain medication will not be refilled after hours or on weekends.   Diet: You received a handout from one of our nutritionists with recommendations. Follow that information. Generally, it is a low fiber (low residue) diet for around 2 weeks and then advance back to regular food slowly over the course of a few days.  If you have ileostomy: Add foods one at a time to progress from a low fiber diet to the unrestricted diet so that problem foods can be identified. When  a food causes a problem, it should be eliminated for a period of time and then restarted.  Increase fluid intake. Be sure to drink plenty of water  and fluids daily. Avoid foods high in fiber or with seeds or kernels. Some prescription pills may pass through the ileostomy, especially extended release medicines. Please schedule an appointment with your family doctor to adjust any medications if necessary.  Passage of mucus per rectum is normal if you have an ileostomy or colostomy. Your colon and rectum continued to produce mucus. Passage of old retained stool is normal. This may have been present prior to surgery, especially in the case of emergency surgery.  If you have a new colostomy or ileostomy, contact the wound/ostomy nurses to schedule follow up. Ostomy nurse phone number: [phone]   Your bowel habits and frequency may be irregular. Frequent loose/soft stools are okay and normal after colon surgery. Inability to distinguish gas from bowel movement may occur. Feeling of incomplete stool evacuation may be normal. After two weeks generally a high fiber diet with daily use of Metamucil/Benefiber and increase fluid intake will help. With continued persistence, bowel habits should return to normal.  If bowel movements are frequent and watery, call the office.  Office location and contact information: [facility] [address], [place], [place] [address] office phone: [phone]   If any questions arise, please do not hesitate to call.  We look forward to providing your ongoing colorectal surgical care in the near future.  Stanley B. Melba, MD, FASCRS, FACS Caprice DRAFTS Lang, MD, FASCRS, FACS Alm DOROTHA Chimes, MD Toribio Callas, MD Ronal Collar Swift, PA-C Lauraine Smalling, PA-C   Discharge instructions     [facility] Discharge Instructions for Colon Surgery Patients  The following recommendations are all-inclusive and  intended cover any of your particular needs. Some of the information may not be applicable to your particular recovery.   Follow-up: You will be contacted by our office to arrange your follow-up appointment. If you do not have staples in your incision or if they have already been removed prior to discharge, follow-up will likely be within the next 2-4 weeks. If staples remain we would anticipate seeing you within the next week for staple removal. If any questions please call our office (see office numbers below).  Drains: You may be discharged  home with a drainage tube. The nursing staff will teach her how to empty and record the output. Avoid shower or bath until the drain is removed. You will likely be seen in the office 3-5 days after discharge to have the drain removed.  Hygiene: Showering is okay.  You may shower if surgical clips or Steri-Strips are present. Steri-Strips will curl up and fall off. Bathing in a tub is not recommended for the first week after surgery or until 48 hours after any staples are removed.  Soaking in a hot tub (with submersion of recent surgical wound is not recommended until third week after surgery or when approved by your surgeon).  You may notice a cottonball gauze wick in your bellybutton. This is okay and will fall out. You may notice some drainage. A little drainage is okay.  Activity: No heavy lifting greater than 15 pounds for at least 6 weeks unless otherwise directed. Climbing stairs, walking, or any light aerobic activity is okay. Gradual increase in activity as tolerated is recommended. Sexual activity as desired.  No driving for at least 1-2 weeks after discharge from the hospital or while taking pain medications.  Call if any fevers greater than 101F and/or associated abdominal pains. Call if any increase in redness or swelling from the incisions. Call if any increased drainage or change in drainage from the incision. Some drainage will be normal and will  improve with time.  Return to work: Discuss with designer, industrial/product at follow-up.  Pain management: Many patients only require over-the-counter pain medicine (Tylenol , ibuprofen) at discharge.  Otherwise you may have been given a prescription for pain medicine. Please take as directed. Do not drive or operate heavy machinery while taking pain medication. Pain medication will not be refilled after hours or on weekends.   Diet: You received a handout from one of our nutritionists with recommendations. Follow that information. Generally, it is a low fiber (low residue) diet for around 2 weeks and then advance back to regular food and even high fiber food.   If you have ileostomy: Add foods one at a time to progress from a low fiber diet to the unrestricted diet so that problem foods can be identified. When a food causes a problem, it should be eliminated for a period of time and then restarted.  Increase fluid intake. Be sure to drink plenty of water  and fluids daily. Avoid foods high in fiber or with seeds or kernels. Some prescription pills may pass through the ileostomy, especially extended release medicines. Please schedule an appointment with your family doctor to adjust any medications if necessary.  Passage of mucus per rectum is normal if you have an ileostomy or colostomy. Your colon and rectum continued to produce mucus. Passage of old retained stool is normal. This may have been present prior to surgery, especially in the case of emergency surgery.  Your bowel habits and frequency may be irregular. Frequent loose/soft stools are okay. Inability to distinguish gas from bowel movement may occur. Feeling of incomplete stool evacuation may be normal. After two weeks generally a high fiber diet with daily use of Citrucel or Metamucil and increase fluid intake will help. With continued persistence, bowel habits should return to normal.  If bowel movements are frequent and watery, call the  office.  Office location and contact information:  [facility] [address] [place], [place] [address] office phone: [phone]   If any questions arise, please do  not hesitate to call.  We look forward to providing your ongoing colorectal surgical care in the near future.   Follow-up with me     ~2 wks w/ PA for stoma site check (seeing gen surg next week), 4-6 wks with NER   Reason for referral: post op   Referral Type: Consultation   Continuity of Care   Follow-up with me     With PA in 2 weeks, with NER 4 weeks after that.   Reason for referral: post-op   Referral Type: Consultation   Continuity of Care   Notify Physician for increased abdominal swelling or bloating     Notify Physician for increased abdominal swelling or bloating     Notify Physician for increased or unrelieved pain     Notify Physician for increased pain at the operative site (or unrelieved pain)     Notify Physician for increased pain at the operative site (or unrelieved pain)     Notify Physician for marked redness     Notify Physician for marked redness     Notify Physician for persistent vomiting     Notify Physician for persistent vomiting     Notify Physician for signs of infection (swelling, heat, redness, red streaks, puss formation, odor from incision or any other trouble at the surgical site)     Notify Physician for signs of infection (swelling, heat, redness, red streaks, puss formation, odor from incision or any other trouble at the surgical site)     Notify Physician if unable to urinate     Notify Physician if unable to urinate     Notify physician - Call your doctor if you experience nausea/vomiting     Notify physician - Call your doctor if you experience nausea/vomiting     Notify physician - Contact your doctor for excessive bleeding     Notify physician - Contact your doctor for excessive bleeding     Weight restrictions     Lifting  restrictions 15 lbs x 4 weeks   Weight restrictions     Lifting restrictions 15 lbs x 6 weeks      Contact information for follow-up     Caprice FORBES Essex, MD  Specialty: Colon and Rectal Surgery  Relationship: Consulting Physician   [address] [place] [address]  Phone: [phone]     Next Steps: Follow up   Instructions: post op   AURORA MOLT, MD  Specialty: Family Medicine  Relationship: PCP - General   [facility] [address] [place] [address]  Phone: [phone]     Next Steps: Follow up   [facility]  Specialty: General Surgery   [address] [place] [address]  Phone: [phone]     Next Steps: Follow up   Instructions: follow up for possible drain removal and wound check   Caprice FORBES Essex, MD  Specialty: Colon and Rectal Surgery  Relationship: Consulting Physician   [address] [place] [address]  Phone: [phone]     Next Steps: Follow up   Instructions: post-op      Appointments which have been scheduled    Jan 15, 2023 10:45 AM Follow Up Appointment with Lonni Norman Lukes, MD [facility]) (--) [address] [place] [address] [phone]       Code Status: Full Code   Electronically signed: Toribio SHAUNNA Callas, MD  01/06/2023 / 12:42 PM

## 2023-04-17 IMAGING — DX DG ABD PORTABLE 1V
2 series · 2 of 2 positions shown · non-contrast
Comparison: 10/24/2021

CLINICAL DATA: Abdominal distension.

EXAM:
PORTABLE ABDOMEN - 1 VIEW

[abdomen kub (1 of 2)]
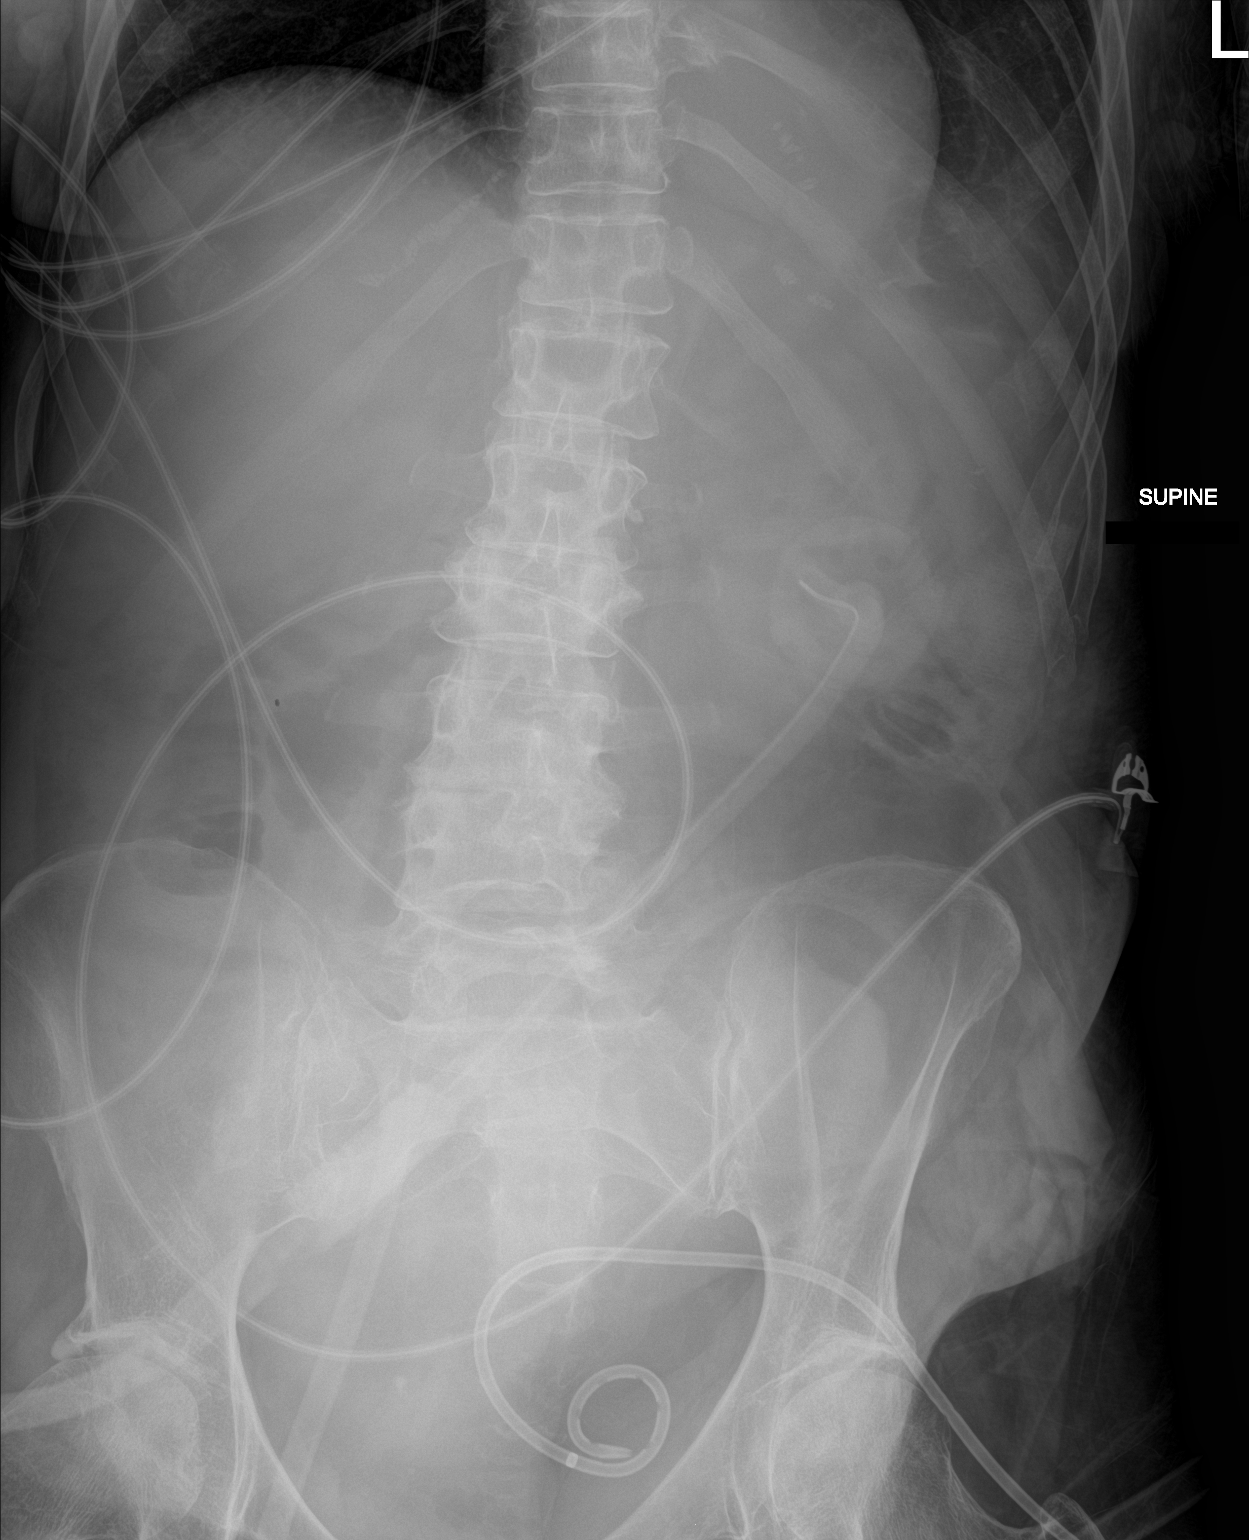

[abdomen kub (2 of 2)]
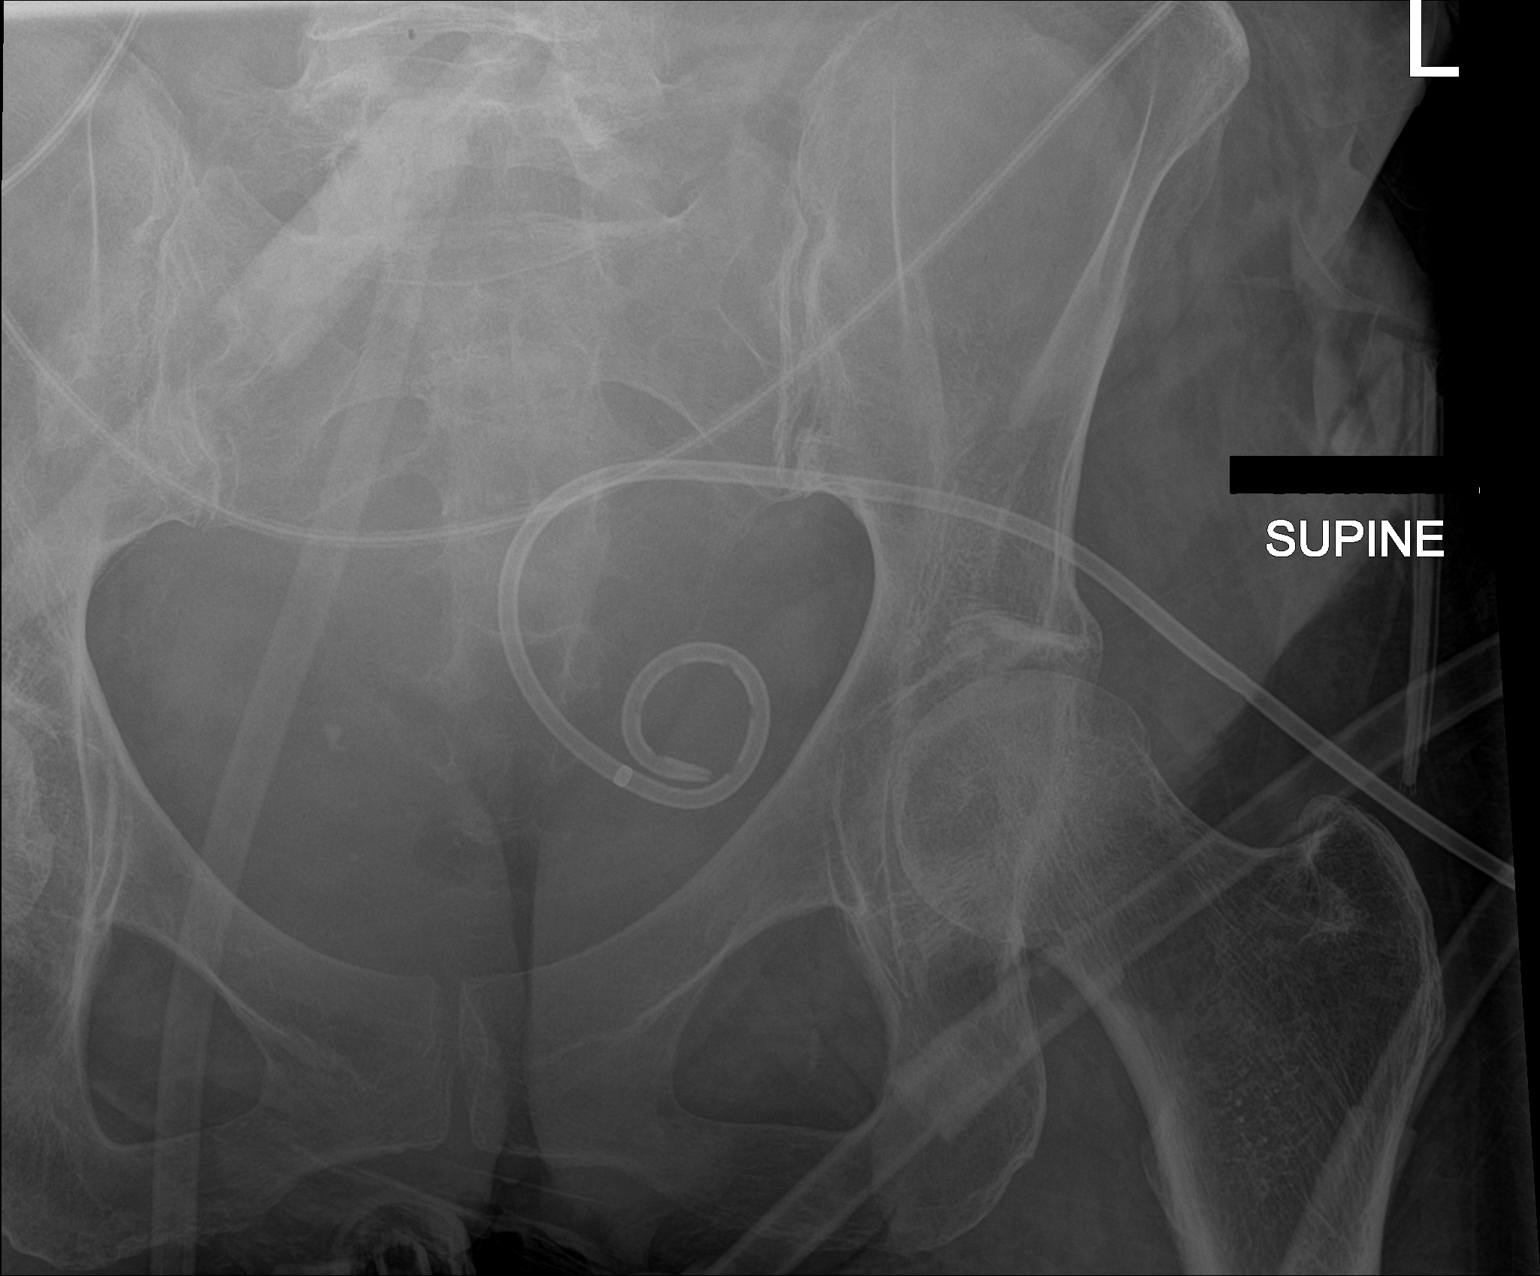

[2 of 2 positions shown; findings below may reference images not displayed]

FINDINGS: There is a percutaneous drainage catheter with pigtail overlying the
left hemipelvis. A gastrostomy tube is identified within the left
hemiabdomen. Mildly dilated loops of small bowel within the left
lower quadrant of the abdomen are improved from previous exam. No
new findings.
IMPRESSION: 1. Improving small bowel distension. No new findings.
2. Stable position of gastrostomy tube and percutaneous drainage
catheter.

## 2023-04-17 IMAGING — DX DG CHEST 1V PORT
1 series · 1 of 1 positions shown · non-contrast
Comparison: Chest x-ray dated October 28, 2021

CLINICAL DATA: Hypoxia

EXAM:
PORTABLE CHEST 1 VIEW

[chest ap]
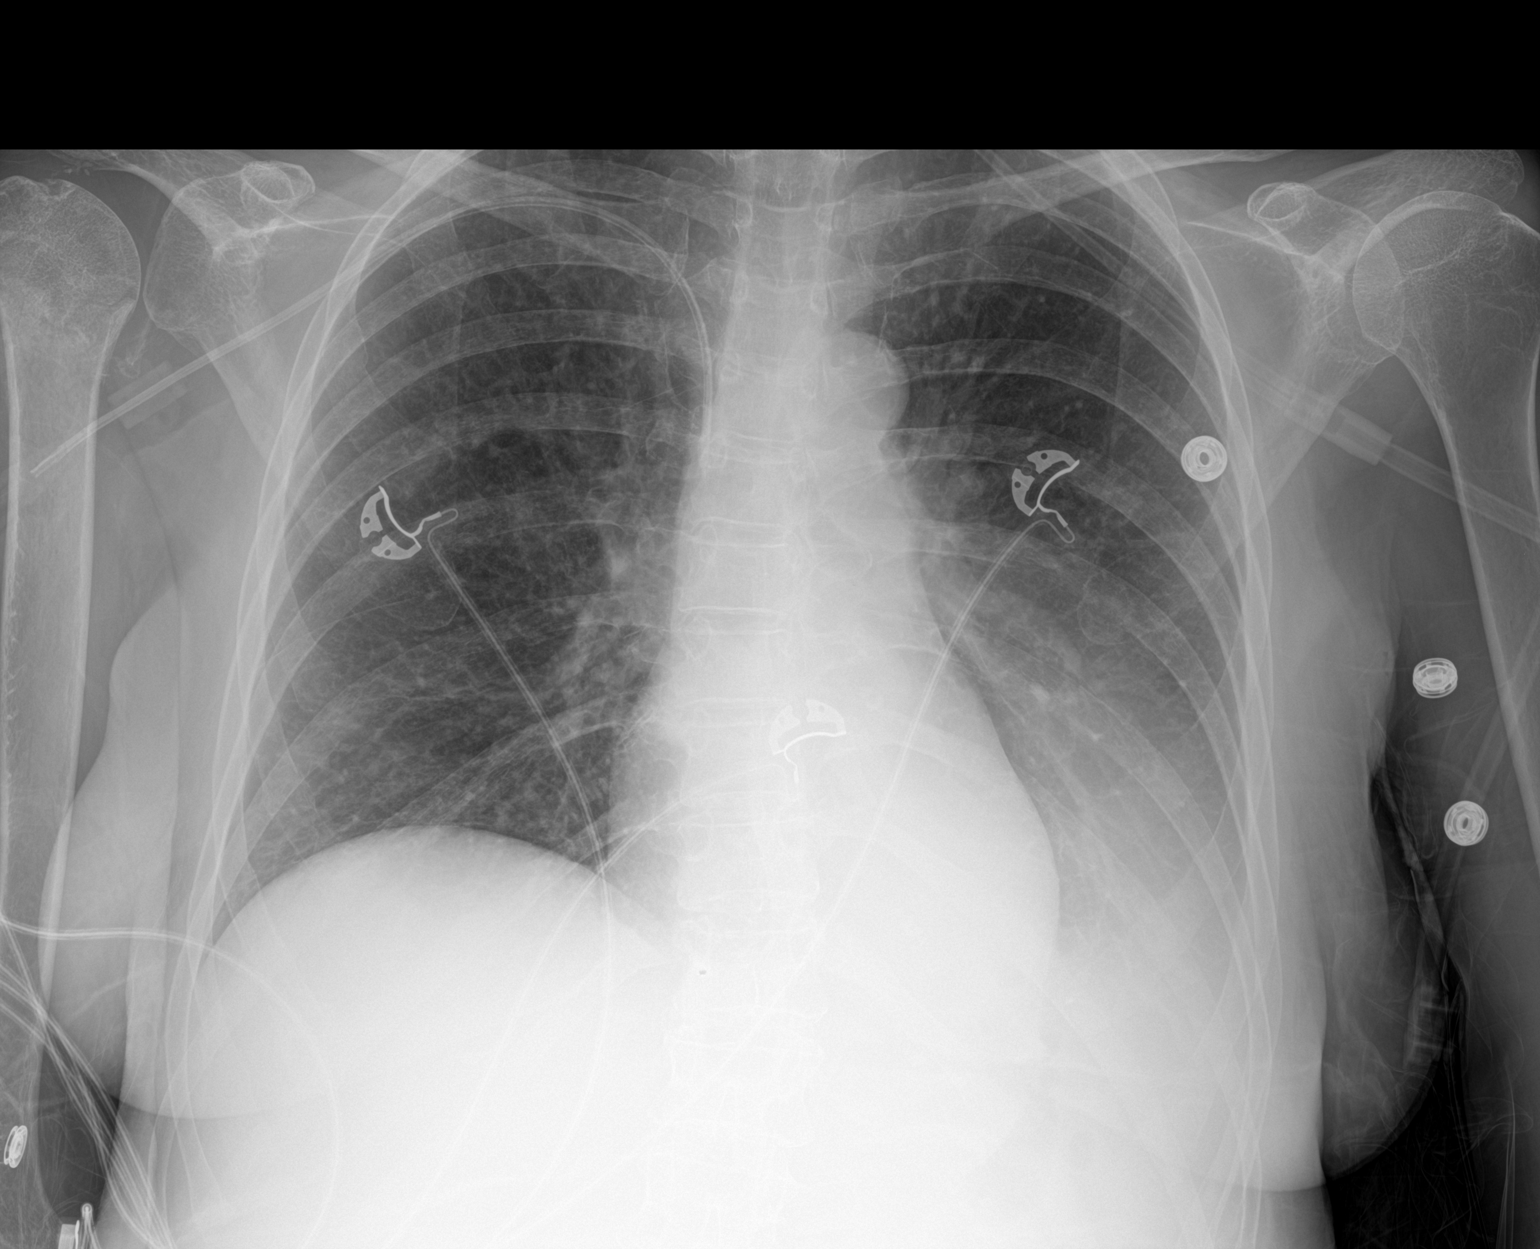

[1 of 1 positions shown; findings below may reference images not displayed]

FINDINGS: Cardiac and mediastinal contours are within normal limits. Right arm
PICC line with tip projecting over the expected area of the superior
cavoatrial junction. Opacity of the lower left lung, likely
combination of atelectasis and pleural effusion. No evidence of
pneumothorax.
IMPRESSION: Increased opacity of the lower left lung, likely due to worsening
pleural effusion and atelectasis.

## 2023-04-20 IMAGING — CT CT ABD-PELV W/O CM
2 of 4 series · 14 of 46 positions shown, 16 images · non-contrast
Comparison: 10/28/2021

CLINICAL DATA: Postop exploratory laparotomy and Hartmann's pouch
procedure.



[Series 2: axial st · axial · 0.70mm/px · z∈[-481,-66]mm · 11 of 95 slices shown, 13 images]
[im 6/95  soft-tissue]
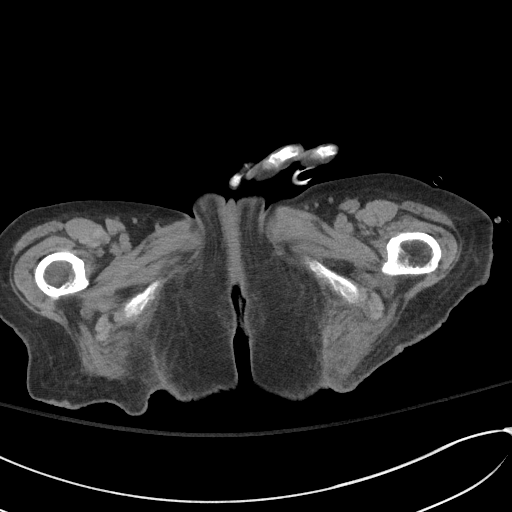
[im 6/95  bone]
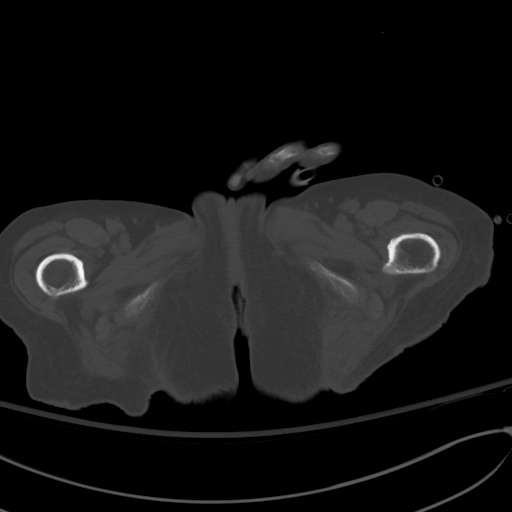
[im 16/95  soft-tissue]
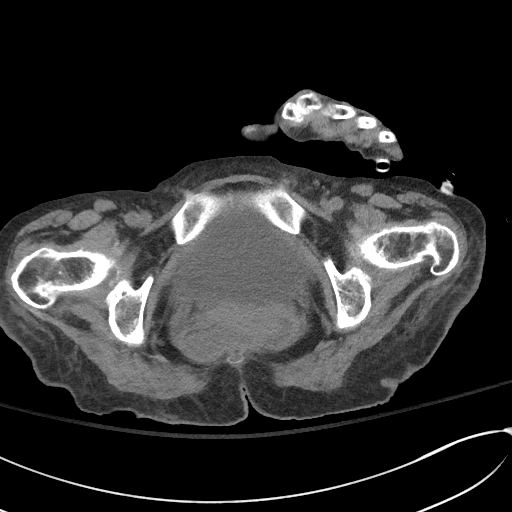
[im 21/95  soft-tissue]
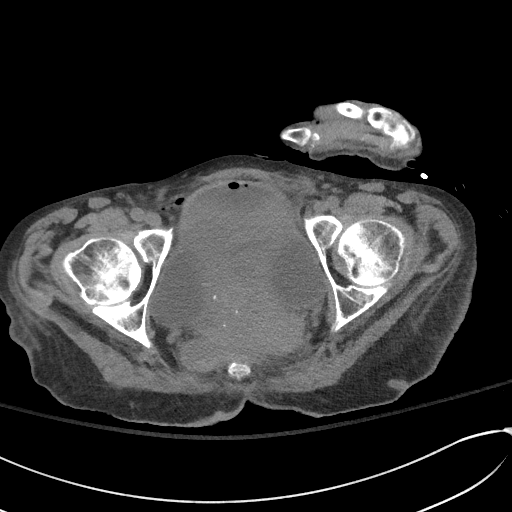
[im 32/95  soft-tissue]
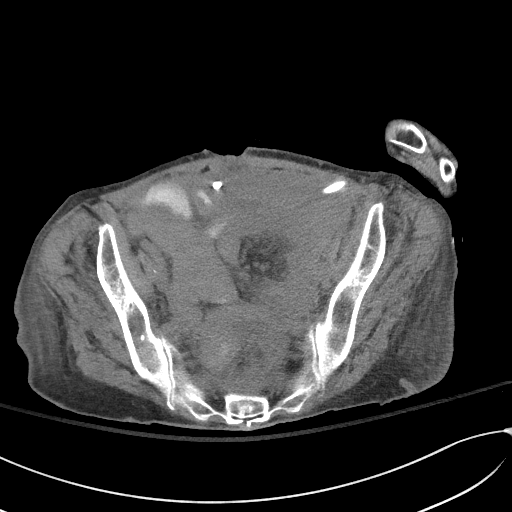
[im 37/95  soft-tissue]
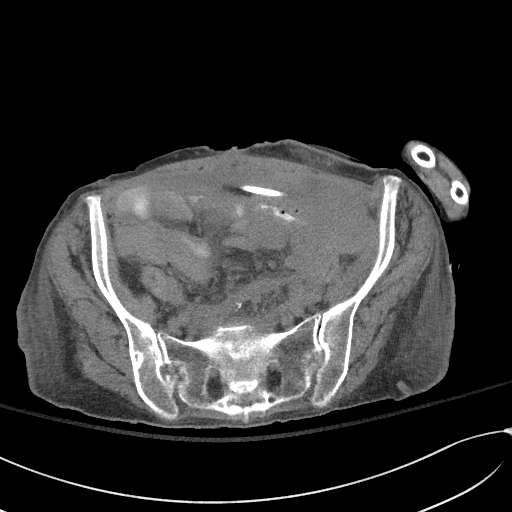
[im 48/95  soft-tissue]
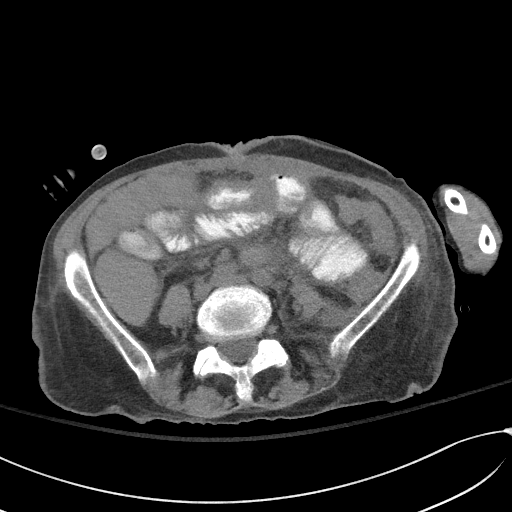
[im 58/95  soft-tissue]
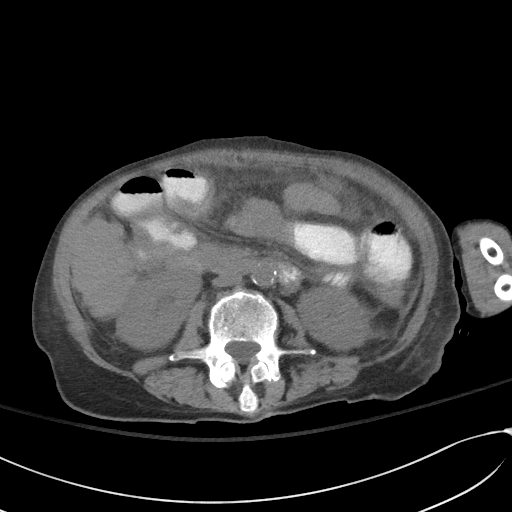
[im 63/95  soft-tissue]
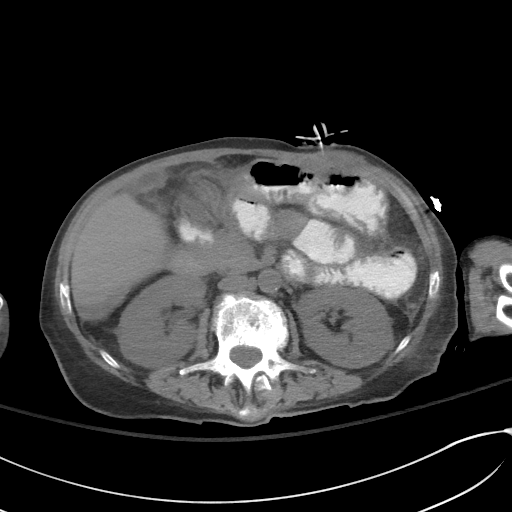
[im 74/95  soft-tissue]
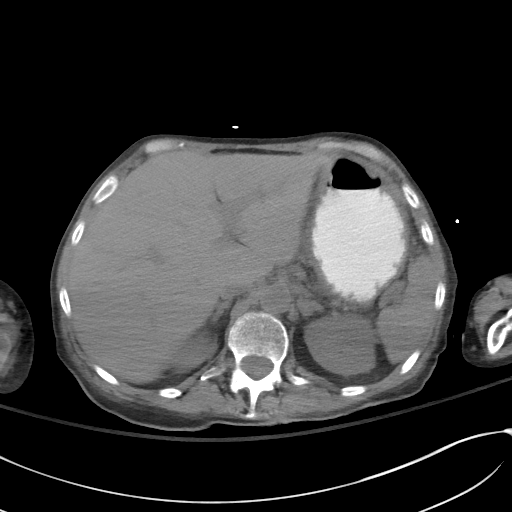
[im 74/95  bone]
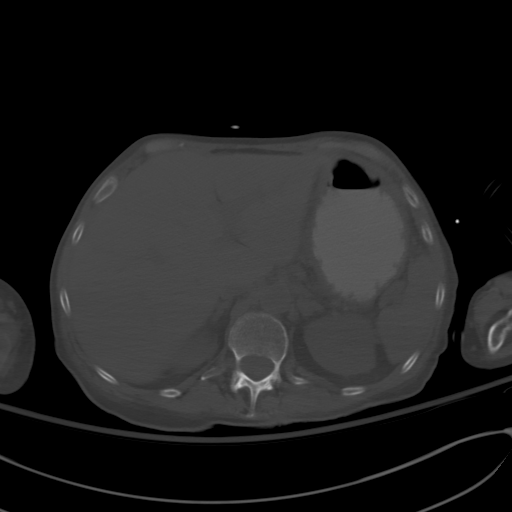
[im 79/95  soft-tissue]
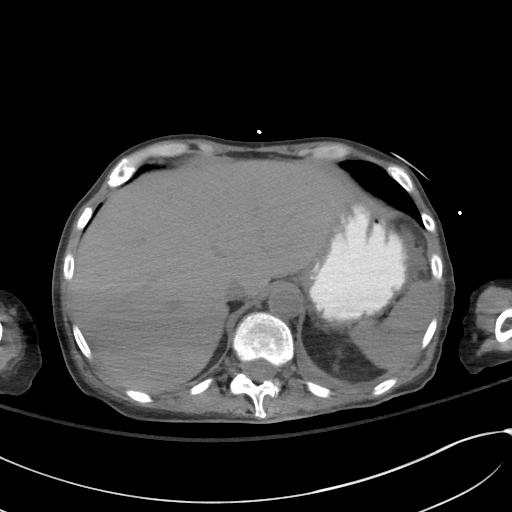
[im 89/95  soft-tissue]
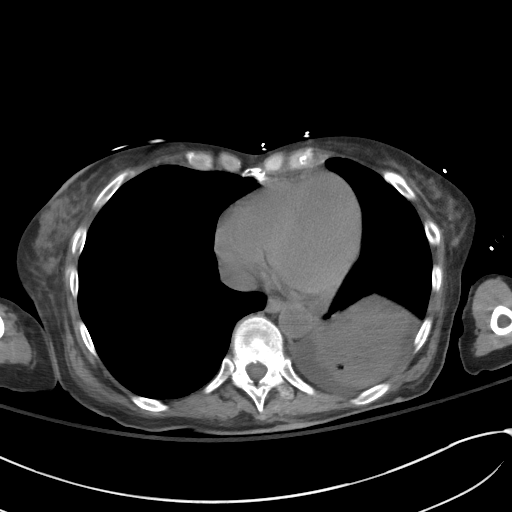

[Series 5: coronal st · coronal · 0.64mm/px · 3 of 74 slices shown]
[im 25/74  soft-tissue]
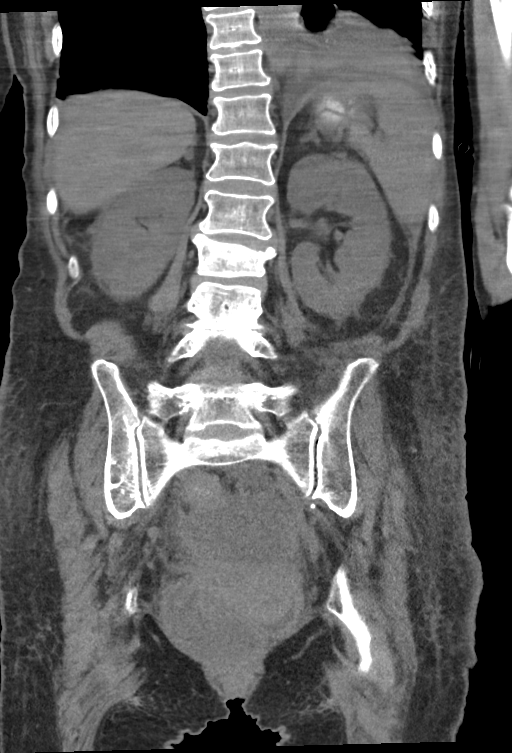
[im 33/74  soft-tissue]
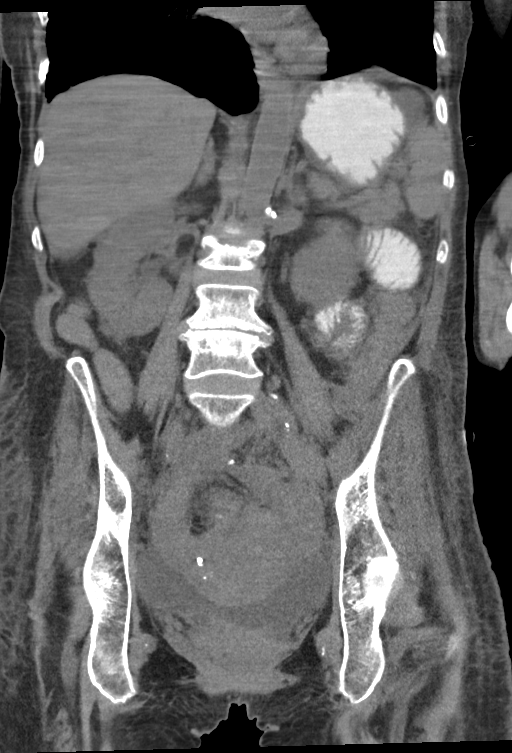
[im 41/74  soft-tissue]
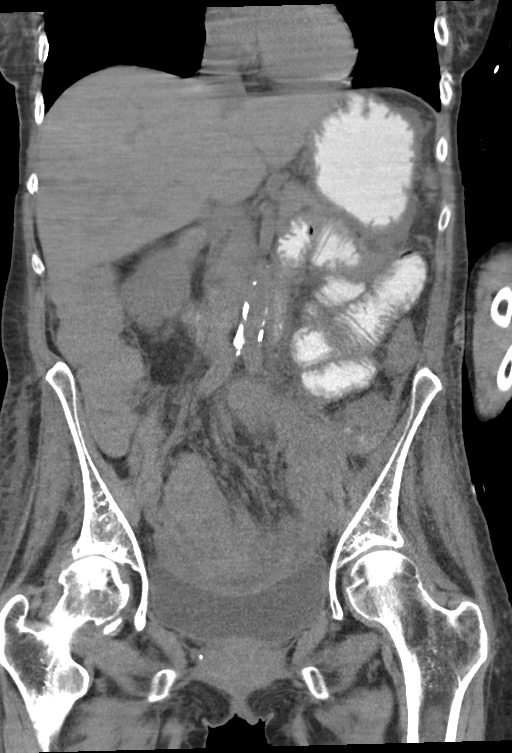

[14 of 46 positions shown; findings below may reference images not displayed]

FINDINGS: Lower chest: Small left pleural effusion with left lower lobe
airspace consolidation/atelectasis.

Hepatobiliary: No focal liver abnormality identified. Gallstone
measures 5 mm. Mild gallbladder wall edema is identified, image
[DATE]. No bile duct dilatation.

Pancreas: Unremarkable. No pancreatic ductal dilatation or
surrounding inflammatory changes.

Spleen: Normal in size without focal abnormality.

Adrenals/Urinary Tract: Normal adrenal glands. No kidney mass,
nephrolithiasis or hydronephrosis. Small foci of gas noted within
the bladder. Bladder otherwise unremarkable.

Stomach/Bowel: Gastrostomy tube is identified within the stomach.
Evaluation of bowel pathology is significantly limited due to lack
of IV contrast material and lack of enteric contrast material within
the pelvic bowel loops. The small bowel loops are upper limits of
normal in caliber measuring 3 cm which may reflect postoperative
ileus. This is improved when compared with 10/28/2021 when the small
bowel loops measured up to 5.1 cm. There is a left lower quadrant
colostomy. [place] procedure has been performed.

Vascular/Lymphatic: Aortic atherosclerosis. No aneurysm. Within the
limitations of unenhanced technique no abdominopelvic adenopathy
identified.

Reproductive: No mass identified.

Other: There is a percutaneous pigtail drainage catheter which
enters from a left lower quadrant approach with pigtail terminating
in the ventral pelvis just right of the midline, image 68/2. The
margins of the previously noted anterior pelvic fluid collection are
difficult to visualize separate from unopacified bowel loops. Within
this limitation there appears to be decreased volume of the
previously noted fluid within the ventral pelvis.

The surgically placed JP drain within the posterior pelvis has been
removed. Within the cul-de-sac, there is a fluid collection
measuring 6.6 x 3.4 cm, image 68/2. Maturity of this fluid
collection and suitability for drainage is difficult to assess due
to lack of IV contrast. No additional suspicious or new fluid
collections identified within the abdomen or pelvis.

A few foci of intraperitoneal gas are identified, likely reflecting
post up change.

Musculoskeletal: No acute or significant osseous findings. Unchanged
compression fracture at the L2 level. Severe degenerative disc
disease noted at L3-4.
IMPRESSION: 1. Evaluation of bowel pathology is significantly limited due to
lack of IV contrast material and lack of enteric contrast material
within the pelvic bowel loops.
2. There is a new fluid collection within the cul-de-sac measuring
6.6 x 3.4 cm. Maturity of this fluid collection and suitability for
drainage is difficult to assess due to lack of IV contrast.
3. Unchanged position of percutaneous pigtail drainage catheter
which terminates in the right ventral pelvis. Previously noted fluid
collection within the anterior pelvis is difficult to reassess due
to factors described above. Within this limitation it appears that
there has been decrease in the volume ventral fluid collection
however. This is difficult to quantify however due to lack of IV
contrast and presence of unopacified bowel loops in the ventral
pelvis/lower abdomen.
4. Improved appearance of small bowel distension consistent with
resolving small-bowel ileus
5. Small left pleural effusion with left lower lobe airspace
consolidation/atelectasis.
6. Gallstone with mild gallbladder wall edema.
7. Aortic Atherosclerosis ([id number]).
